# Patient Record
Sex: Female | Born: 1935
Health system: Southern US, Community
[De-identification: ages and names within clinical notes are randomized; demographics above are authoritative.]

## PROBLEM LIST (undated history)

## (undated) DIAGNOSIS — R7309 Other abnormal glucose: Secondary | ICD-10-CM

## (undated) DIAGNOSIS — E119 Type 2 diabetes mellitus without complications: Secondary | ICD-10-CM

## (undated) DIAGNOSIS — S0101XA Laceration without foreign body of scalp, initial encounter: Secondary | ICD-10-CM

## (undated) DIAGNOSIS — I10 Essential (primary) hypertension: Secondary | ICD-10-CM

## (undated) DIAGNOSIS — M1990 Unspecified osteoarthritis, unspecified site: Secondary | ICD-10-CM

## (undated) DIAGNOSIS — I729 Aneurysm of unspecified site: Secondary | ICD-10-CM

## (undated) DIAGNOSIS — M545 Low back pain, unspecified: Secondary | ICD-10-CM

## (undated) DIAGNOSIS — M25569 Pain in unspecified knee: Secondary | ICD-10-CM

## (undated) DIAGNOSIS — I82409 Acute embolism and thrombosis of unspecified deep veins of unspecified lower extremity: Secondary | ICD-10-CM

## (undated) DIAGNOSIS — G629 Polyneuropathy, unspecified: Secondary | ICD-10-CM

## (undated) DIAGNOSIS — G4700 Insomnia, unspecified: Secondary | ICD-10-CM

## (undated) HISTORY — PX: CHOLECYSTECTOMY: SHX55

## (undated) HISTORY — DX: Polyneuropathy, unspecified: G62.9

## (undated) HISTORY — DX: Aneurysm of unspecified site: I72.9

## (undated) HISTORY — DX: Laceration without foreign body of scalp, initial encounter: S01.01XA

## (undated) HISTORY — DX: Pain in unspecified knee: M25.569

## (undated) HISTORY — DX: Other abnormal glucose: R73.09

## (undated) HISTORY — DX: Insomnia, unspecified: G47.00

## (undated) HISTORY — DX: Acute embolism and thrombosis of unspecified deep veins of unspecified lower extremity: I82.409

## (undated) HISTORY — DX: Type 2 diabetes mellitus without complications: E11.9

## (undated) HISTORY — DX: Unspecified osteoarthritis, unspecified site: M19.90

## (undated) HISTORY — PX: TUBAL LIGATION: SHX77

## (undated) HISTORY — PX: JOINT REPLACEMENT: SHX530

## (undated) HISTORY — DX: Low back pain: M54.5

## (undated) HISTORY — PX: CEREBRAL ANEURYSM REPAIR: SHX164

## (undated) HISTORY — DX: Low back pain, unspecified: M54.50

## (undated) HISTORY — PX: TOTAL KNEE ARTHROPLASTY: SHX125

## (undated) HISTORY — DX: Essential (primary) hypertension: I10

---

## 1981-02-12 DIAGNOSIS — I729 Aneurysm of unspecified site: Secondary | ICD-10-CM

## 1981-02-12 HISTORY — DX: Aneurysm of unspecified site: I72.9

## 1998-01-11 ENCOUNTER — Other Ambulatory Visit: Admission: RE | Admit: 1998-01-11 | Discharge: 1998-01-11 | Payer: Self-pay | Admitting: Gynecology

## 2000-05-11 ENCOUNTER — Encounter: Admission: RE | Admit: 2000-05-11 | Discharge: 2000-05-11 | Payer: Self-pay | Admitting: Orthopaedic Surgery

## 2000-05-11 ENCOUNTER — Encounter: Payer: Self-pay | Admitting: Orthopaedic Surgery

## 2000-05-16 ENCOUNTER — Emergency Department (HOSPITAL_COMMUNITY): Admission: EM | Admit: 2000-05-16 | Discharge: 2000-05-16 | Payer: Self-pay | Admitting: Emergency Medicine

## 2004-10-10 ENCOUNTER — Ambulatory Visit: Payer: Self-pay | Admitting: Internal Medicine

## 2005-01-10 ENCOUNTER — Ambulatory Visit: Payer: Self-pay | Admitting: Internal Medicine

## 2005-04-12 ENCOUNTER — Ambulatory Visit: Payer: Self-pay | Admitting: Internal Medicine

## 2005-07-30 ENCOUNTER — Encounter: Admission: RE | Admit: 2005-07-30 | Discharge: 2005-07-30 | Payer: Self-pay

## 2005-10-16 ENCOUNTER — Ambulatory Visit: Payer: Self-pay | Admitting: Internal Medicine

## 2005-10-24 ENCOUNTER — Ambulatory Visit: Payer: Self-pay | Admitting: Internal Medicine

## 2005-11-16 ENCOUNTER — Ambulatory Visit: Payer: Self-pay | Admitting: Internal Medicine

## 2005-11-20 ENCOUNTER — Encounter: Admission: RE | Admit: 2005-11-20 | Discharge: 2006-02-18 | Payer: Self-pay | Admitting: Internal Medicine

## 2006-01-18 ENCOUNTER — Emergency Department (HOSPITAL_COMMUNITY): Admission: EM | Admit: 2006-01-18 | Discharge: 2006-01-18 | Payer: Self-pay | Admitting: Emergency Medicine

## 2006-01-18 ENCOUNTER — Emergency Department (HOSPITAL_COMMUNITY): Admission: EM | Admit: 2006-01-18 | Discharge: 2006-01-19 | Payer: Self-pay | Admitting: Emergency Medicine

## 2006-01-23 ENCOUNTER — Ambulatory Visit: Payer: Self-pay | Admitting: Internal Medicine

## 2006-02-06 ENCOUNTER — Ambulatory Visit: Payer: Self-pay | Admitting: Internal Medicine

## 2006-02-06 LAB — CONVERTED CEMR LAB
BUN: 12 mg/dL (ref 6–23)
Chol/HDL Ratio, serum: 3.8
Cholesterol: 160 mg/dL (ref 0–200)
Creatinine, Ser: 0.9 mg/dL (ref 0.4–1.2)
Glucose, Bld: 129 mg/dL — ABNORMAL HIGH (ref 70–99)
HDL: 41.9 mg/dL (ref >39.0)
Hgb A1c MFr Bld: 6.5 % — ABNORMAL HIGH (ref 4.6–6.0)
LDL Cholesterol: 102 mg/dL — ABNORMAL HIGH (ref 0–99)
Potassium: 3.9 meq/L (ref 3.5–5.1)
Sodium: 146 meq/L — ABNORMAL HIGH (ref 135–145)
Triglyceride fasting, serum: 80 mg/dL (ref 0–149)
VLDL: 16 mg/dL (ref 0–40)

## 2006-02-19 ENCOUNTER — Ambulatory Visit: Payer: Self-pay | Admitting: Internal Medicine

## 2006-05-27 ENCOUNTER — Ambulatory Visit: Payer: Self-pay | Admitting: Internal Medicine

## 2006-06-04 ENCOUNTER — Ambulatory Visit: Payer: Self-pay | Admitting: Internal Medicine

## 2006-06-04 LAB — CONVERTED CEMR LAB
ALT: 16 units/L (ref 0–40)
Alkaline Phosphatase: 82 units/L (ref 39–117)
Amylase: 52 units/L (ref 27–131)
BUN: 14 mg/dL (ref 6–23)
Basophils Absolute: 0 10*3/uL (ref 0.0–0.1)
Basophils Relative: 0.6 % (ref 0.0–1.0)
Cholesterol: 180 mg/dL (ref 0–200)
Creatinine, Ser: 0.7 mg/dL (ref 0.4–1.2)
Eosinophils Absolute: 0.2 10*3/uL (ref 0.0–0.6)
Eosinophils Relative: 2.4 % (ref 0.0–5.0)
Glucose, Bld: 107 mg/dL — ABNORMAL HIGH (ref 70–99)
HCT: 33.5 % — ABNORMAL LOW (ref 36.0–46.0)
HDL: 47.3 mg/dL (ref >39.0)
Hemoglobin: 11.4 g/dL — ABNORMAL LOW (ref 12.0–15.0)
Hgb A1c MFr Bld: 6.1 % — ABNORMAL HIGH (ref 4.6–6.0)
LDL Cholesterol: 114 mg/dL — ABNORMAL HIGH (ref 0–99)
Lipase: 17 units/L (ref 11.0–59.0)
Lymphocytes Relative: 24 % (ref 12.0–46.0)
MCHC: 34.1 g/dL (ref 30.0–36.0)
MCV: 88 fL (ref 78.0–100.0)
Monocytes Absolute: 0.6 10*3/uL (ref 0.2–0.7)
Monocytes Relative: 8.8 % (ref 3.0–11.0)
Neutro Abs: 4.5 10*3/uL (ref 1.4–7.7)
Neutrophils Relative %: 64.2 % (ref 43.0–77.0)
Platelets: 191 10*3/uL (ref 150–400)
Potassium: 3.7 meq/L (ref 3.5–5.1)
RBC: 3.8 M/uL — ABNORMAL LOW (ref 3.87–5.11)
RDW: 12.7 % (ref 11.5–14.6)
Sodium: 142 meq/L (ref 135–145)
Total CHOL/HDL Ratio: 3.8
Triglycerides: 92 mg/dL (ref 0–149)
VLDL: 18 mg/dL (ref 0–40)
WBC: 7 10*3/uL (ref 4.5–10.5)

## 2006-06-11 ENCOUNTER — Ambulatory Visit: Payer: Self-pay | Admitting: Internal Medicine

## 2006-08-20 ENCOUNTER — Ambulatory Visit: Payer: Self-pay | Admitting: Internal Medicine

## 2007-01-28 ENCOUNTER — Ambulatory Visit: Payer: Self-pay | Admitting: Internal Medicine

## 2007-01-28 DIAGNOSIS — R710 Precipitous drop in hematocrit: Secondary | ICD-10-CM | POA: Insufficient documentation

## 2007-01-28 DIAGNOSIS — R739 Hyperglycemia, unspecified: Secondary | ICD-10-CM | POA: Insufficient documentation

## 2007-01-28 DIAGNOSIS — M810 Age-related osteoporosis without current pathological fracture: Secondary | ICD-10-CM | POA: Insufficient documentation

## 2007-01-28 LAB — CONVERTED CEMR LAB
ALT: 18 units/L (ref 0–35)
AST: 18 units/L (ref 0–37)
Albumin: 3.4 g/dL — ABNORMAL LOW (ref 3.5–5.2)
Alkaline Phosphatase: 71 units/L (ref 39–117)
BUN: 20 mg/dL (ref 6–23)
Basophils Absolute: 0 10*3/uL (ref 0.0–0.1)
Basophils Relative: 0.5 % (ref 0.0–1.0)
Bilirubin Urine: NEGATIVE
Bilirubin, Direct: 0.2 mg/dL (ref 0.0–0.3)
CO2: 27 meq/L (ref 19–32)
Calcium: 9.5 mg/dL (ref 8.4–10.5)
Chloride: 109 meq/L (ref 96–112)
Cholesterol: 214 mg/dL (ref 0–200)
Creatinine, Ser: 0.8 mg/dL (ref 0.4–1.2)
Crystals: NEGATIVE
Direct LDL: 133.5 mg/dL
Eosinophils Absolute: 0.2 10*3/uL (ref 0.0–0.6)
Eosinophils Relative: 4.7 % (ref 0.0–5.0)
GFR calc Af Amer: 89 mL/min
GFR calc non Af Amer: 74 mL/min
Glucose, Bld: 106 mg/dL — ABNORMAL HIGH (ref 70–99)
HCT: 34.7 % — ABNORMAL LOW (ref 36.0–46.0)
HDL: 61.3 mg/dL (ref >39.0)
Hemoglobin, Urine: NEGATIVE
Hemoglobin: 12 g/dL (ref 12.0–15.0)
Hgb A1c MFr Bld: 5.8 % (ref 4.6–6.0)
Ketones, ur: NEGATIVE mg/dL
Lymphocytes Relative: 32.7 % (ref 12.0–46.0)
MCHC: 34.5 g/dL (ref 30.0–36.0)
MCV: 89.5 fL (ref 78.0–100.0)
Monocytes Absolute: 0.4 10*3/uL (ref 0.2–0.7)
Monocytes Relative: 8 % (ref 3.0–11.0)
Mucus, UA: NEGATIVE
Neutro Abs: 2.5 10*3/uL (ref 1.4–7.7)
Neutrophils Relative %: 54.1 % (ref 43.0–77.0)
Nitrite: NEGATIVE
Platelets: 153 10*3/uL (ref 150–400)
Potassium: 4.5 meq/L (ref 3.5–5.1)
RBC / HPF: NONE SEEN
RBC: 3.87 M/uL (ref 3.87–5.11)
RDW: 12.5 % (ref 11.5–14.6)
Sodium: 142 meq/L (ref 135–145)
Specific Gravity, Urine: 1.02 (ref 1.000–1.03)
TSH: 1.65 microintl units/mL (ref 0.35–5.50)
Total Bilirubin: 0.6 mg/dL (ref 0.3–1.2)
Total CHOL/HDL Ratio: 3.5
Total Protein, Urine: NEGATIVE mg/dL
Total Protein: 6.2 g/dL (ref 6.0–8.3)
Triglycerides: 59 mg/dL (ref 0–149)
Urine Glucose: NEGATIVE mg/dL
Urobilinogen, UA: 0.2 (ref 0.0–1.0)
VLDL: 12 mg/dL (ref 0–40)
WBC: 4.6 10*3/uL (ref 4.5–10.5)
pH: 6 (ref 5.0–8.0)

## 2007-01-29 LAB — CONVERTED CEMR LAB: Vit D, 1,25-Dihydroxy: 29 — ABNORMAL LOW (ref 30–89)

## 2007-02-17 ENCOUNTER — Ambulatory Visit: Payer: Self-pay | Admitting: Internal Medicine

## 2007-02-17 DIAGNOSIS — K219 Gastro-esophageal reflux disease without esophagitis: Secondary | ICD-10-CM | POA: Insufficient documentation

## 2007-02-17 DIAGNOSIS — E559 Vitamin D deficiency, unspecified: Secondary | ICD-10-CM | POA: Insufficient documentation

## 2007-02-17 DIAGNOSIS — M545 Low back pain: Secondary | ICD-10-CM | POA: Insufficient documentation

## 2007-02-17 DIAGNOSIS — M1990 Unspecified osteoarthritis, unspecified site: Secondary | ICD-10-CM | POA: Insufficient documentation

## 2007-02-17 DIAGNOSIS — I10 Essential (primary) hypertension: Secondary | ICD-10-CM | POA: Insufficient documentation

## 2007-02-17 DIAGNOSIS — G4700 Insomnia, unspecified: Secondary | ICD-10-CM | POA: Insufficient documentation

## 2007-03-13 ENCOUNTER — Encounter: Payer: Self-pay | Admitting: Internal Medicine

## 2007-07-30 ENCOUNTER — Ambulatory Visit: Payer: Self-pay | Admitting: Internal Medicine

## 2007-08-02 LAB — CONVERTED CEMR LAB
ALT: 17 units/L (ref 0–35)
AST: 18 units/L (ref 0–37)
Albumin: 3.8 g/dL (ref 3.5–5.2)
Alkaline Phosphatase: 103 units/L (ref 39–117)
BUN: 22 mg/dL (ref 6–23)
Basophils Absolute: 0.1 10*3/uL (ref 0.0–0.1)
Basophils Relative: 0.9 % (ref 0.0–1.0)
Bilirubin, Direct: 0.1 mg/dL (ref 0.0–0.3)
CO2: 29 meq/L (ref 19–32)
Calcium: 9.6 mg/dL (ref 8.4–10.5)
Chloride: 109 meq/L (ref 96–112)
Cholesterol: 194 mg/dL (ref 0–200)
Creatinine, Ser: 0.8 mg/dL (ref 0.4–1.2)
Eosinophils Absolute: 0.2 10*3/uL (ref 0.0–0.7)
Eosinophils Relative: 3.9 % (ref 0.0–5.0)
GFR calc Af Amer: 89 mL/min
GFR calc non Af Amer: 74 mL/min
Glucose, Bld: 131 mg/dL — ABNORMAL HIGH (ref 70–99)
HCT: 36.5 % (ref 36.0–46.0)
HDL: 44 mg/dL (ref >39.0)
Hemoglobin: 12.6 g/dL (ref 12.0–15.0)
Hgb A1c MFr Bld: 6.1 % — ABNORMAL HIGH (ref 4.6–6.0)
LDL Cholesterol: 133 mg/dL — ABNORMAL HIGH (ref 0–99)
Lymphocytes Relative: 28.1 % (ref 12.0–46.0)
MCHC: 34.6 g/dL (ref 30.0–36.0)
MCV: 89.2 fL (ref 78.0–100.0)
Monocytes Absolute: 0.5 10*3/uL (ref 0.1–1.0)
Monocytes Relative: 8.6 % (ref 3.0–12.0)
Neutro Abs: 3.2 10*3/uL (ref 1.4–7.7)
Neutrophils Relative %: 58.5 % (ref 43.0–77.0)
Platelets: 185 10*3/uL (ref 150–400)
Potassium: 4.6 meq/L (ref 3.5–5.1)
RBC: 4.1 M/uL (ref 3.87–5.11)
RDW: 12.1 % (ref 11.5–14.6)
Sodium: 142 meq/L (ref 135–145)
TSH: 1.99 microintl units/mL (ref 0.35–5.50)
Total Bilirubin: 0.6 mg/dL (ref 0.3–1.2)
Total CHOL/HDL Ratio: 4.4
Total Protein: 6.5 g/dL (ref 6.0–8.3)
Triglycerides: 86 mg/dL (ref 0–149)
VLDL: 17 mg/dL (ref 0–40)
WBC: 5.6 10*3/uL (ref 4.5–10.5)

## 2007-08-19 ENCOUNTER — Ambulatory Visit: Payer: Self-pay | Admitting: Internal Medicine

## 2007-08-19 DIAGNOSIS — M79609 Pain in unspecified limb: Secondary | ICD-10-CM | POA: Insufficient documentation

## 2007-08-19 DIAGNOSIS — G629 Polyneuropathy, unspecified: Secondary | ICD-10-CM | POA: Insufficient documentation

## 2007-11-19 ENCOUNTER — Ambulatory Visit: Payer: Self-pay | Admitting: Internal Medicine

## 2007-11-19 LAB — CONVERTED CEMR LAB
BUN: 24 mg/dL — ABNORMAL HIGH (ref 6–23)
CO2: 28 meq/L (ref 19–32)
Calcium: 9.4 mg/dL (ref 8.4–10.5)
Chloride: 110 meq/L (ref 96–112)
Creatinine, Ser: 0.7 mg/dL (ref 0.4–1.2)
GFR calc Af Amer: 104 mL/min
GFR calc non Af Amer: 86 mL/min
Glucose, Bld: 126 mg/dL — ABNORMAL HIGH (ref 70–99)
Potassium: 4.5 meq/L (ref 3.5–5.1)
Sed Rate: 28 mm/hr — ABNORMAL HIGH (ref 0–22)
Sodium: 143 meq/L (ref 135–145)
Vitamin B-12: 564 pg/mL (ref 211–911)

## 2007-11-26 ENCOUNTER — Ambulatory Visit: Payer: Self-pay | Admitting: Internal Medicine

## 2007-11-27 ENCOUNTER — Encounter: Payer: Self-pay | Admitting: Internal Medicine

## 2008-05-11 ENCOUNTER — Ambulatory Visit: Payer: Self-pay | Admitting: Internal Medicine

## 2008-05-11 LAB — CONVERTED CEMR LAB
BUN: 18 mg/dL (ref 6–23)
CO2: 28 meq/L (ref 19–32)
Calcium: 9.4 mg/dL (ref 8.4–10.5)
Chloride: 109 meq/L (ref 96–112)
Creatinine, Ser: 0.7 mg/dL (ref 0.4–1.2)
GFR calc non Af Amer: 85.78 mL/min (ref >60)
Glucose, Bld: 133 mg/dL — ABNORMAL HIGH (ref 70–99)
Hgb A1c MFr Bld: 6.4 % (ref 4.6–6.5)
Potassium: 3.7 meq/L (ref 3.5–5.1)
Sodium: 144 meq/L (ref 135–145)
TSH: 1.14 microintl units/mL (ref 0.35–5.50)

## 2008-05-18 ENCOUNTER — Ambulatory Visit: Payer: Self-pay | Admitting: Internal Medicine

## 2008-05-18 DIAGNOSIS — M25569 Pain in unspecified knee: Secondary | ICD-10-CM | POA: Insufficient documentation

## 2008-06-07 ENCOUNTER — Encounter: Payer: Self-pay | Admitting: Internal Medicine

## 2008-11-09 ENCOUNTER — Ambulatory Visit: Payer: Self-pay | Admitting: Internal Medicine

## 2008-11-09 LAB — CONVERTED CEMR LAB
ALT: 20 units/L (ref 0–35)
AST: 23 units/L (ref 0–37)
Albumin: 3.8 g/dL (ref 3.5–5.2)
Alkaline Phosphatase: 116 units/L (ref 39–117)
BUN: 17 mg/dL (ref 6–23)
Basophils Absolute: 0.1 10*3/uL (ref 0.0–0.1)
Basophils Relative: 1 % (ref 0.0–3.0)
Bilirubin, Direct: 0.1 mg/dL (ref 0.0–0.3)
CO2: 30 meq/L (ref 19–32)
Calcium: 9.5 mg/dL (ref 8.4–10.5)
Chloride: 112 meq/L (ref 96–112)
Creatinine, Ser: 0.7 mg/dL (ref 0.4–1.2)
Eosinophils Absolute: 0.2 10*3/uL (ref 0.0–0.7)
Eosinophils Relative: 3.7 % (ref 0.0–5.0)
GFR calc non Af Amer: 85.67 mL/min (ref >60)
Glucose, Bld: 124 mg/dL — ABNORMAL HIGH (ref 70–99)
HCT: 38 % (ref 36.0–46.0)
Hemoglobin: 12.9 g/dL (ref 12.0–15.0)
Lymphocytes Relative: 24.3 % (ref 12.0–46.0)
Lymphs Abs: 1.5 10*3/uL (ref 0.7–4.0)
MCHC: 33.8 g/dL (ref 30.0–36.0)
MCV: 91.5 fL (ref 78.0–100.0)
Monocytes Absolute: 0.5 10*3/uL (ref 0.1–1.0)
Monocytes Relative: 8 % (ref 3.0–12.0)
Neutro Abs: 4 10*3/uL (ref 1.4–7.7)
Neutrophils Relative %: 63 % (ref 43.0–77.0)
Platelets: 170 10*3/uL (ref 150.0–400.0)
Potassium: 4 meq/L (ref 3.5–5.1)
RBC: 4.16 M/uL (ref 3.87–5.11)
RDW: 12 % (ref 11.5–14.6)
Sed Rate: 28 mm/hr — ABNORMAL HIGH (ref 0–22)
Sodium: 144 meq/L (ref 135–145)
TSH: 1.43 microintl units/mL (ref 0.35–5.50)
Total Bilirubin: 0.4 mg/dL (ref 0.3–1.2)
Total Protein: 6.8 g/dL (ref 6.0–8.3)
Vitamin B-12: 686 pg/mL (ref 211–911)
WBC: 6.3 10*3/uL (ref 4.5–10.5)

## 2008-11-16 ENCOUNTER — Ambulatory Visit: Payer: Self-pay | Admitting: Internal Medicine

## 2009-02-16 ENCOUNTER — Ambulatory Visit: Payer: Self-pay | Admitting: Internal Medicine

## 2009-02-16 LAB — CONVERTED CEMR LAB
Albumin ELP: 56.1 % (ref 55.8–66.1)
Alpha-1-Globulin: 4.6 % (ref 2.9–4.9)
Alpha-2-Globulin: 11.2 % (ref 7.1–11.8)
BUN: 19 mg/dL (ref 6–23)
Beta Globulin: 6.9 % (ref 4.7–7.2)
CO2: 28 meq/L (ref 19–32)
Calcium: 9.6 mg/dL (ref 8.4–10.5)
Chloride: 109 meq/L (ref 96–112)
Creatinine, Ser: 0.7 mg/dL (ref 0.4–1.2)
GFR calc non Af Amer: 85.61 mL/min (ref >60)
Gamma Globulin: 14 % (ref 11.1–18.8)
Glucose, Bld: 114 mg/dL — ABNORMAL HIGH (ref 70–99)
Hgb A1c MFr Bld: 6.2 % (ref 4.6–6.5)
Potassium: 4.2 meq/L (ref 3.5–5.1)
Sed Rate: 29 mm/hr — ABNORMAL HIGH (ref 0–22)
Sodium: 144 meq/L (ref 135–145)
Total Protein, Serum Electrophoresis: 6.8 g/dL (ref 6.0–8.3)

## 2009-03-09 ENCOUNTER — Ambulatory Visit: Payer: Self-pay | Admitting: Internal Medicine

## 2009-03-09 DIAGNOSIS — Z87891 Personal history of nicotine dependence: Secondary | ICD-10-CM | POA: Insufficient documentation

## 2009-04-07 ENCOUNTER — Encounter: Payer: Self-pay | Admitting: Internal Medicine

## 2009-04-12 HISTORY — PX: KNEE ARTHROPLASTY: SHX992

## 2009-04-19 ENCOUNTER — Encounter: Payer: Self-pay | Admitting: Internal Medicine

## 2009-06-07 ENCOUNTER — Telehealth: Payer: Self-pay | Admitting: Internal Medicine

## 2009-07-07 ENCOUNTER — Telehealth: Payer: Self-pay | Admitting: Internal Medicine

## 2009-08-08 ENCOUNTER — Telehealth: Payer: Self-pay | Admitting: Internal Medicine

## 2009-08-31 ENCOUNTER — Ambulatory Visit: Payer: Self-pay | Admitting: Internal Medicine

## 2009-08-31 LAB — CONVERTED CEMR LAB
ALT: 18 units/L (ref 0–35)
AST: 20 units/L (ref 0–37)
Albumin: 3.8 g/dL (ref 3.5–5.2)
Alkaline Phosphatase: 91 units/L (ref 39–117)
BUN: 20 mg/dL (ref 6–23)
Bilirubin, Direct: 0.1 mg/dL (ref 0.0–0.3)
CO2: 28 meq/L (ref 19–32)
Calcium: 9.4 mg/dL (ref 8.4–10.5)
Chloride: 112 meq/L (ref 96–112)
Cholesterol: 186 mg/dL (ref 0–200)
Creatinine, Ser: 0.7 mg/dL (ref 0.4–1.2)
GFR calc non Af Amer: 85.49 mL/min (ref >60)
Glucose, Bld: 124 mg/dL — ABNORMAL HIGH (ref 70–99)
HDL: 45.1 mg/dL (ref >39.00)
Hgb A1c MFr Bld: 6.6 % — ABNORMAL HIGH (ref 4.6–6.5)
LDL Cholesterol: 110 mg/dL — ABNORMAL HIGH (ref 0–99)
Potassium: 4.6 meq/L (ref 3.5–5.1)
Sodium: 142 meq/L (ref 135–145)
TSH: 1.24 microintl units/mL (ref 0.35–5.50)
Total Bilirubin: 0.4 mg/dL (ref 0.3–1.2)
Total CHOL/HDL Ratio: 4
Total Protein: 6.6 g/dL (ref 6.0–8.3)
Triglycerides: 153 mg/dL — ABNORMAL HIGH (ref 0.0–149.0)
VLDL: 30.6 mg/dL (ref 0.0–40.0)

## 2009-09-06 ENCOUNTER — Ambulatory Visit: Payer: Self-pay | Admitting: Internal Medicine

## 2010-01-09 ENCOUNTER — Ambulatory Visit: Payer: Self-pay | Admitting: Internal Medicine

## 2010-01-09 LAB — CONVERTED CEMR LAB: Vit D, 25-Hydroxy: 50 ng/mL (ref 30–89)

## 2010-01-10 LAB — CONVERTED CEMR LAB
ALT: 19 units/L (ref 0–35)
AST: 21 units/L (ref 0–37)
Albumin: 3.7 g/dL (ref 3.5–5.2)
Alkaline Phosphatase: 96 units/L (ref 39–117)
BUN: 21 mg/dL (ref 6–23)
Basophils Absolute: 0 10*3/uL (ref 0.0–0.1)
Basophils Relative: 0.5 % (ref 0.0–3.0)
Bilirubin Urine: NEGATIVE
Bilirubin, Direct: 0.1 mg/dL (ref 0.0–0.3)
CO2: 28 meq/L (ref 19–32)
Calcium: 9.5 mg/dL (ref 8.4–10.5)
Chloride: 107 meq/L (ref 96–112)
Cholesterol: 200 mg/dL (ref 0–200)
Creatinine, Ser: 0.6 mg/dL (ref 0.4–1.2)
Eosinophils Absolute: 0.2 10*3/uL (ref 0.0–0.7)
Eosinophils Relative: 4 % (ref 0.0–5.0)
GFR calc non Af Amer: 94.72 mL/min (ref >60)
Glucose, Bld: 140 mg/dL — ABNORMAL HIGH (ref 70–99)
HCT: 34.6 % — ABNORMAL LOW (ref 36.0–46.0)
HDL: 46.4 mg/dL (ref >39.00)
Hemoglobin: 12 g/dL (ref 12.0–15.0)
Hgb A1c MFr Bld: 6.6 % — ABNORMAL HIGH (ref 4.6–6.5)
Ketones, ur: NEGATIVE mg/dL
LDL Cholesterol: 121 mg/dL — ABNORMAL HIGH (ref 0–99)
Lymphocytes Relative: 27.1 % (ref 12.0–46.0)
Lymphs Abs: 1.5 10*3/uL (ref 0.7–4.0)
MCHC: 34.6 g/dL (ref 30.0–36.0)
MCV: 90.2 fL (ref 78.0–100.0)
Monocytes Absolute: 0.4 10*3/uL (ref 0.1–1.0)
Monocytes Relative: 8 % (ref 3.0–12.0)
Neutro Abs: 3.4 10*3/uL (ref 1.4–7.7)
Neutrophils Relative %: 60.4 % (ref 43.0–77.0)
Nitrite: NEGATIVE
Platelets: 182 10*3/uL (ref 150.0–400.0)
Potassium: 3.9 meq/L (ref 3.5–5.1)
RBC: 3.84 M/uL — ABNORMAL LOW (ref 3.87–5.11)
RDW: 12.3 % (ref 11.5–14.6)
Sodium: 142 meq/L (ref 135–145)
Specific Gravity, Urine: 1.02 (ref 1.000–1.030)
TSH: 1.89 microintl units/mL (ref 0.35–5.50)
Total Bilirubin: 0.7 mg/dL (ref 0.3–1.2)
Total CHOL/HDL Ratio: 4
Total Protein, Urine: NEGATIVE mg/dL
Total Protein: 6.7 g/dL (ref 6.0–8.3)
Triglycerides: 164 mg/dL — ABNORMAL HIGH (ref 0.0–149.0)
Urine Glucose: NEGATIVE mg/dL
Urobilinogen, UA: 0.2 (ref 0.0–1.0)
VLDL: 32.8 mg/dL (ref 0.0–40.0)
Vitamin B-12: 331 pg/mL (ref 211–911)
WBC: 5.6 10*3/uL (ref 4.5–10.5)
pH: 6 (ref 5.0–8.0)

## 2010-01-17 ENCOUNTER — Ambulatory Visit: Payer: Self-pay | Admitting: Internal Medicine

## 2010-03-14 NOTE — Letter (Signed)
Summary: [facility]  [facility]   Imported By: Sherian Rein 05/04/2009 07:39:50  _____________________________________________________________________  External Attachment:    Type:   Image     Comment:   External Document

## 2010-03-14 NOTE — Assessment & Plan Note (Signed)
Summary: f/u appt, pt does not want cpx/#/cd   Vital Signs:  Patient profile:   77 year old female Height:      62 inches Weight:      178.75 pounds BMI:     32.81 O2 Sat:      96 % on Room air Pulse rate:   71 / minute Pulse rhythm:   regular BP sitting:   136 / 82  (left arm) Cuff size:   large  Vitals Entered By: Rock Nephew CMA (January 17, 2010 10:02 AM)  O2 Flow:  Room air CC: follow-up visit/lap results Is Patient Diabetic? Yes Did you bring your meter with you today? No Pain Assessment Patient in pain? no       Does patient need assistance? Functional Status Self care Ambulation Normal   CC:  follow-up visit/lap results.  History of Present Illness: The patient presents for a follow up of hypertension, diabetes, hyperlipidemia She wants to get off thioridazine - too $$  Current Medications (verified): 1)  Sulindac 150 Mg  Tabs (Sulindac) .Marland Kitchen.. 1 By Mouth Two Times A Day Pc 2)  Thioridazine Hcl 10 Mg  Tabs (Thioridazine Hcl) .Marland Kitchen.. 1 By Mouth Q Hs 3)  Amitriptyline Hcl 25 Mg Tabs (Amitriptyline Hcl) .... Take Two Tablet At Bedtime 4)  Vitamin D3 1000 Unit  Tabs (Cholecalciferol) .... 2 Once Daily 5)  B-100 Complex   Tabs (Vitamins-Lipotropics) .Marland Kitchen.. 1 By Mouth Qd 6)  Hydrocodone-Acetaminophen 5-325 Mg Tabs (Hydrocodone-Acetaminophen) .Marland Kitchen.. 1-2 By Mouth Two Times A Day As Needed Pain 7)  Westcort 0.2 % Oint (Hydrocortisone Valerate) .... Use Two Times A Day Prn 8)  Hydrochlorothiazide 12.5 Mg Caps (Hydrochlorothiazide) .Marland Kitchen.. 1 By Mouth Once Daily For Blood Pressure  Allergies (verified): 1)  ! Naproxen Comfort Pac (Naproxen-Liniment) 2)  ! Lyrica (Pregabalin) 3)  Lyrica 4)  Gabapentin (Gabapentin)  Past History:  Past Medical History: Last updated: 05/18/2008 Peripheral neuropathy unknown etiol GERD Hypertension Elev. glu Insomnia Diabetes mellitus, type II, diet controlled 2009 L knee pain Dr Tomasa Blase Low back pain  Social History: Last  updated: 03/09/2009 Retired Former Smoker Alcohol use-no 1 son Married  Physical Exam  General:  overweight-appearing.   Nose:  External nasal examination shows no deformity or inflammation. Nasal mucosa are pink and moist without lesions or exudates. Mouth:  Oral mucosa and oropharynx without lesions or exudates.  Teeth in good repair. Dry skin on lips Lungs:  Normal respiratory effort, chest expands symmetrically. Lungs are clear to auscultation, no crackles or wheezes. Heart:  Normal rate and regular rhythm. S1 and S2 normal without gallop, murmur, click, rub or other extra sounds. Abdomen:  Bowel sounds positive,abdomen soft and non-tender without masses, organomegaly or hernias noted. Msk:  6 mm bony bump on mid -uper forehead L knee is less  tender w/ROM, scar is healing Neurologic:  No cranial nerve deficits noted. Station and gait are normal. Plantar reflexes are down-going bilaterally. DTRs are symmetrical throughout. Sensory, motor and coordinative functions appear intact. Skin:  Intact without suspicious lesions or rashes Psych:  Cognition and judgment appear intact. Alert and cooperative with normal attention span and concentration. No apparent delusions, illusions, hallucinations   Impression & Recommendations:  Problem # 1:  KNEE PAIN (ICD-719.46) Assessment Unchanged  Her updated medication list for this problem includes:    Sulindac 150 Mg Tabs (Sulindac) .Marland Kitchen... 1 by mouth two times a day pc    Hydrocodone-acetaminophen 5-325 Mg Tabs (Hydrocodone-acetaminophen) .Marland Kitchen... 1-2 by mouth two  times a day as needed pain  Problem # 2:  DIABETES MELLITUS, TYPE II (ICD-250.00) Assessment: Unchanged  Problem # 3:  HYPERTENSION (ICD-401.9) Assessment: Unchanged  Her updated medication list for this problem includes:    Hydrochlorothiazide 12.5 Mg Caps (Hydrochlorothiazide) .Marland Kitchen... 1 by mouth once daily for blood pressure  BP today: 136/82 Prior BP: 136/80 (09/06/2009)  Labs  Reviewed: K+: 3.9 (01/09/2010) Creat: : 0.6 (01/09/2010)   Chol: 200 (01/09/2010)   HDL: 46.40 (01/09/2010)   LDL: 121 (01/09/2010)   TG: 164.0 (01/09/2010)  Problem # 4:  INSOMNIA, PERSISTENT (ICD-307.42) Assessment: Comment Only  Complete Medication List: 1)  Sulindac 150 Mg Tabs (Sulindac) .Marland Kitchen.. 1 by mouth two times a day pc 2)  Amitriptyline Hcl 25 Mg Tabs (Amitriptyline hcl) .... Take two tablet at bedtime 3)  Vitamin D3 1000 Unit Tabs (Cholecalciferol) .... 2 once daily 4)  B-100 Complex Tabs (Vitamins-lipotropics) .Marland Kitchen.. 1 by mouth qd 5)  Hydrocodone-acetaminophen 5-325 Mg Tabs (Hydrocodone-acetaminophen) .Marland Kitchen.. 1-2 by mouth two times a day as needed pain 6)  Westcort 0.2 % Oint (Hydrocortisone valerate) .... Use two times a day prn 7)  Hydrochlorothiazide 12.5 Mg Caps (Hydrochlorothiazide) .Marland Kitchen.. 1 by mouth once daily for blood pressure  Patient Instructions: 1)  Please schedule a follow-up appointment in 6 months. 2)  Take Thioridazine 1/2 tab daily x 1 wk, then stop 3)  BMP prior to visit, ICD-9: 4)  HbgA1C prior to visit, ICD-9:250.00   Orders Added: 1)  Est. Patient Level IV [29562]    Contraindications/Deferment of Procedures/Staging:    Test/Procedure: FLU VAX    Reason for deferment: patient declined

## 2010-03-14 NOTE — Progress Notes (Signed)
Summary: sulindac  Phone Note Refill Request Message from:  Fax from Pharmacy on June 07, 2009 11:34 AM  Refills Requested: Medication #1:  SULINDAC 150 MG  TABS 1 by mouth two times a day pc   Dosage confirmed as above?Dosage Confirmed Next Appointment Scheduled: 09/06/2009 Initial call taken by: Brenton Grills,  June 07, 2009 11:35 AM    Prescriptions: SULINDAC 150 MG  TABS (SULINDAC) 1 by mouth two times a day pc  #60 x 3   Entered by:   Lucious Groves   Authorized by:   Tresa Garter MD   Signed by:   Lucious Groves on 06/07/2009   Method used:   Electronically to        [facility]* (retail)       [address]       [place], [place]  [id number]       Ph: [phone]       Fax: [phone]   RxID:   [id number]

## 2010-03-14 NOTE — Assessment & Plan Note (Signed)
Summary: 6 MTH FU-STC   Vital Signs:  Patient Profile:   77 Years Old Female Weight:      170 pounds Temp:     98.2 degrees F oral Pulse rate:   75 / minute BP sitting:   196 / 109  (left arm)  Vitals Entered By: Tora Perches (August 19, 2007 10:49 AM)                 Chief Complaint:  Multiple medical problems or concerns.  History of Present Illness: C/o B leg pain and tingling from knees to feet and R hand has the same symptoms x months. F/u HTN, insomnia. Forgot the name of her BP med    Current Allergies: LYRICA  Past Medical History:    Peripheral neuropathy    GERD    Hypertension    Elev. glu    Insomnia   Family History:    Family History Hypertension  Social History:    Retired    Former Smoker    Alcohol use-no    1 son   Risk Factors:  Tobacco use:  quit Alcohol use:  no    Physical Exam  General:     Well-developed,well-nourished,in no acute distress; alert,appropriate and cooperative throughout examination Eyes:     No corneal or conjunctival inflammation noted. EOMI. Perrla. Funduscopic exam benign, without hemorrhages, exudates or papilledema. Vision grossly normal. Ears:     External ear exam shows no significant lesions or deformities.  Otoscopic examination reveals clear canals, tympanic membranes are intact bilaterally without bulging, retraction, inflammation or discharge. Hearing is grossly normal bilaterally. Mouth:     Oral mucosa and oropharynx without lesions or exudates.  Teeth in good repair. Neck:     No deformities, masses, or tenderness noted. Lungs:     Normal respiratory effort, chest expands symmetrically. Lungs are clear to auscultation, no crackles or wheezes. Heart:     Normal rate and regular rhythm. S1 and S2 normal without gallop, murmur, click, rub or other extra sounds. Abdomen:     Bowel sounds positive,abdomen soft and non-tender without masses, organomegaly or hernias noted. Msk:      No deformity or scoliosis noted of thoracic or lumbar spine.   Neurologic:     No cranial nerve deficits noted. Station and gait are normal. Plantar reflexes are down-going bilaterally. DTRs are symmetrical throughout. Sensory, motor and coordinative functions appear intact. Skin:     Intact without suspicious lesions or rashes Psych:     Cognition and judgment appear intact. Alert and cooperative with normal attention span and concentration. No apparent delusions, illusions, hallucinations    Impression & Recommendations:  Problem # 1:  PERIPHERAL NEUROPATHY (ICD-356.9) B LE Assessment: Deteriorated Get labs. Unclear etiol. Rx given  Problem # 2:  LEG PAIN (ICD-729.5) B due to above Assessment: Deteriorated  Problem # 3:  OSTEOARTHRITIS (ICD-715.90)  Her updated medication list for this problem includes:    Clinoril 200 Mg Tabs (Sulindac) .Marland Kitchen... 1 by mouth once daily pc    Darvocet-n 100 100-650 Mg Tabs (Propoxyphene n-apap) .Marland Kitchen... 1 by mouth three times a day as needed pain   Problem # 4:  HYPERTENSION (ICD-401.9) Assessment: Comment Only States BP is Ok at home Cont Rx  Complete Medication List: 1)  Clinoril 200 Mg Tabs (Sulindac) .Marland Kitchen.. 1 by mouth once daily pc 2)  Amitriptyline Hcl 50 Mg Tabs (Amitriptyline hcl) .... Take 1 tab by mouth at bedtime 3)  Thioridazine Hcl 10  Mg Tabs (Thioridazine hcl) .Marland Kitchen.. 1 by mouth q hs 4)  Darvocet-n 100 100-650 Mg Tabs (Propoxyphene n-apap) .Marland Kitchen.. 1 by mouth three times a day as needed pain 5)  Vitamin D3 1000 Unit Tabs (Cholecalciferol) .Marland Kitchen.. 1 by mouth daily 6)  B-100 Complex Tabs (Vitamins-lipotropics) .Marland Kitchen.. 1 by mouth qd   Patient Instructions: 1)  Please schedule a follow-up appointment in 3 months. 2)  BMP prior to visit, ICD-9: 3)  ESR 4)  Vit B12 782.0   Prescriptions: DARVOCET-N 100 100-650 MG TABS (PROPOXYPHENE N-APAP) 1 by mouth three times a day as needed pain  #90 x 3    Entered and Authorized by:   Tresa Garter MD   Signed by:   Tresa Garter MD on 08/19/2007   Method used:   Print then Give to Patient   RxID:   [id number]  ]

## 2010-03-14 NOTE — Progress Notes (Signed)
Summary: REFILLS  Phone Note Refill Request Message from:  Fax from Pharmacy on Jul 07, 2009 4:25 PM  Refills Requested: Medication #1:  HYDROCODONE-ACETAMINOPHEN 5-325 MG TABS 1-2 by mouth two times a day as needed pain   Last Refilled: 03/09/2009   Is this ok to refill ([place])  Initial call taken by: Rock Nephew CMA,  Jul 07, 2009 4:25 PM  Follow-up for Phone Call        ok to ref keep ROV Follow-up by: Tresa Garter MD,  Jul 08, 2009 7:47 AM    Prescriptions: HYDROCODONE-ACETAMINOPHEN 5-325 MG TABS (HYDROCODONE-ACETAMINOPHEN) 1-2 by mouth two times a day as needed pain  #100 x 1   Entered by:   Lamar Sprinkles, CMA   Authorized by:   Tresa Garter MD   Signed by:   Lamar Sprinkles, CMA on 07/08/2009   Method used:   Telephoned to ...       [facility]* (retail)       [address]       [place], [place]  [address]       Ph: [phone]       Fax: [phone]   RxID:   [id number] THIORIDAZINE HCL 10 MG  TABS (THIORIDAZINE HCL) 1 by mouth q hs  #30 Each x 6   Entered by:   Rock Nephew CMA   Authorized by:   Tresa Garter MD   Signed by:   Rock Nephew CMA on 07/07/2009   Method used:   Electronically to        [facility]* (retail)       [address]       [place], [place]  [address]       Ph: [phone]       Fax: [phone]   RxID:   [id number]

## 2010-03-14 NOTE — Progress Notes (Signed)
Summary: Refill--Amitriptyline  Phone Note Refill Request Message from:  Fax from Pharmacy on August 08, 2009 2:33 PM  Refills Requested: Medication #1:  AMITRIPTYLINE HCL 25 MG TABS Take two tablet at bedtime   Supply Requested: 9 months Next Appointment Scheduled: 09-06-09 Initial call taken by: Lucious Groves,  August 08, 2009 2:33 PM  Follow-up for Phone Call        ok Follow-up by: Etta Grandchild MD,  August 08, 2009 2:34 PM

## 2010-03-14 NOTE — Assessment & Plan Note (Signed)
Summary: 6 mo rov Kristina Wilkinson $50 Kristina Wilkinson   Vital Signs:  Patient profile:   77 year old female Weight:      174 pounds Temp:     98.2 degrees F oral Pulse rate:   88 / minute BP sitting:   136 / 92  (left arm)  Vitals Entered By: Tora Perches (November 16, 2008 10:11 AM) CC: f/u Is Patient Diabetic? No   CC:  f/u.  History of Present Illness: The patient presents for a follow up of hypertension, hyperlipidemia, OA, neuropathy   Current Medications (verified): 1)  Sulindac 150 Mg  Tabs (Sulindac) .Marland Kitchen.. 1 By Mouth Two Times A Day Pc 2)  Thioridazine Hcl 10 Mg  Tabs (Thioridazine Hcl) .Marland Kitchen.. 1 By Mouth Q Hs 3)  Amitriptyline Hcl 25 Mg Tabs (Amitriptyline Hcl) .... Take Two Tablet At Bedtime 4)  Vitamin D3 1000 Unit  Tabs (Cholecalciferol) .... 2 Once Daily 5)  B-100 Complex   Tabs (Vitamins-Lipotropics) .Marland Kitchen.. 1 By Mouth Qd 6)  Triamcinolone Acetonide 0.5 % Crea (Triamcinolone Acetonide) .... Apply Bid To Affected Area  Allergies: 1)  ! Naproxen Comfort Pac (Naproxen-Liniment) 2)  ! Lyrica (Pregabalin) 3)  Lyrica 4)  Gabapentin (Gabapentin)  Past History:  Past Medical History: Last updated: 05/18/2008 Peripheral neuropathy unknown etiol GERD Hypertension Elev. glu Insomnia Diabetes mellitus, type II, diet controlled 2009 L knee pain Dr Tomasa Blase Low back pain  Social History: Last updated: 08/19/2007 Retired Former Smoker Alcohol use-no 1 son  Review of Systems  The patient denies fever, dyspnea on exertion, and prolonged cough.    Physical Exam  General:  overweight-appearing.   Nose:  External nasal examination shows no deformity or inflammation. Nasal mucosa are pink and moist without lesions or exudates. Mouth:  Oral mucosa and oropharynx without lesions or exudates.  Teeth in good repair. Lungs:  Normal respiratory effort, chest expands symmetrically. Lungs are clear to auscultation, no crackles or wheezes.  Heart:  Normal rate and regular rhythm. S1 and S2 normal without gallop, murmur, click, rub or other extra sounds. Abdomen:  Bowel sounds positive,abdomen soft and non-tender without masses, organomegaly or hernias noted. Msk:  6 mm bony bump on mid -uper forehead Neurologic:  No cranial nerve deficits noted. Station and gait are normal. Plantar reflexes are down-going bilaterally. DTRs are symmetrical throughout. Sensory, motor and coordinative functions appear intact. Skin:  Intact without suspicious lesions or rashes Psych:  Cognition and judgment appear intact. Alert and cooperative with normal attention span and concentration. No apparent delusions, illusions, hallucinations   Impression & Recommendations:  Problem # 1:  OSTEOARTHRITIS (ICD-715.90) Assessment Unchanged  Her updated medication list for this problem includes:    Sulindac 150 Mg Tabs (Sulindac) .Marland Kitchen... 1 by mouth two times a day pc  Problem # 2:  PERIPHERAL NEUROPATHY (ICD-356.9) Assessment: Unchanged On prescription therapy   Problem # 3:  HYPERTENSION (ICD-401.9) Assessment: Improved  BP today: 136/92 Prior BP: 142/100 (05/18/2008)  Labs Reviewed: K+: 4.0 (11/09/2008) Creat: : 0.7 (11/09/2008)   Chol: 194 (07/30/2007)   HDL: 44.0 (07/30/2007)   LDL: 133 (07/30/2007)   TG: 86 (07/30/2007)  Problem # 4:  DIABETES MELLITUS, TYPE II (ICD-250.00) Assessment: Comment Only On diet  Problem # 5:  LOW BACK PAIN (ICD-724.2) Assessment: Comment Only  Her updated medication list for this problem includes:    Sulindac 150 Mg Tabs (Sulindac) .Marland Kitchen... 1 by mouth two times a day pc  Problem # 6:  6 mm bony growth on  forehead Assessment: New Refused x rays  Complete Medication List: 1)  Sulindac 150 Mg Tabs (Sulindac) .Marland Kitchen.. 1 by mouth two times a day pc 2)  Thioridazine Hcl 10 Mg Tabs (Thioridazine hcl) .Marland Kitchen.. 1 by mouth q hs 3)  Amitriptyline Hcl 25 Mg Tabs (Amitriptyline hcl) .... Take two tablet at bedtime  4)  Vitamin D3 1000 Unit Tabs (Cholecalciferol) .... 2 once daily 5)  B-100 Complex Tabs (Vitamins-lipotropics) .Marland Kitchen.. 1 by mouth qd 6)  Triamcinolone Acetonide 0.5 % Crea (Triamcinolone acetonide) .... Apply bid to affected area 7)  Cymbalta 30 Mg Cpep (Duloxetine hcl) .Marland Kitchen.. 1 by mouth qd  Other Orders: Pneumococcal Vaccine (55732) Admin 1st Vaccine (20254) Admin 1st Vaccine [facility]) [phone])  Patient Instructions: 1)  Try to eat more raw plant food, fresh and dry fruit, raw almonds, leafy vegetables, whole foods and less red meat, less animal fat. Poultry and fish is better for you than pork and beef. Avoid processed foods (canned soups, hot dogs, sausage, bacon , frozen dinners). Avoid corn syrup, high fructose syrup or aspartam and Splenda  containing drinks. Honey, Agave and Stevia are better sweeteners. Make your own  dressing with olive oil, wine vinegar, lemon juce, garlic etc. for your salads. 2)  Try a gluten free diet x 1 month 3)  Diabetic socks 4)  Please schedule a follow-up appointment in 3 months. 5)  BMP prior to visit, ICD-9: 6)  HbgA1C prior to visit, ICD-9: 7)  ESR 8)  SPEP 782.0   250.00 Prescriptions: CYMBALTA 30 MG CPEP (DULOXETINE HCL) 1 by mouth qd  #30 x 6   Entered and Authorized by:   Tresa Garter MD   Signed by:   Tresa Garter MD on 11/16/2008   Method used:   Print then Give to Patient   RxID:   [id number]    Pneumovax Vaccine    Vaccine Type: Pneumovax    Site: left deltoid    Mfr: Merck    Dose: 0.5 ml    Route: IM    Given by: Tora Perches    Exp. Date: 01/23/2010    Lot #: [id number]    VIS given: 09/10/95 version given November 16, 2008.

## 2010-03-14 NOTE — Assessment & Plan Note (Signed)
Summary: 6 MTH FU-STC   Vital Signs:  Patient Profile:   77 Years Old Female Weight:      164 pounds Temp:     97.2 degrees F oral Pulse rate:   63 / minute BP sitting:   192 / 93  (left arm)  Vitals Entered By: Tora Perches (February 17, 2007 10:14 AM)             Is Patient Diabetic? No     Chief Complaint:  Multiple medical problems or concerns.  History of Present Illness: The patient presents for a follow up of hypertension, OA, hyperlipidemia   Acute Visit History: The patient has a prior history of GERD.        Current Allergies: No known allergies   Past Medical History:    GERD    Osteoarthritis    Low back pain    Insomnia    Hypertension - [place] HTN with Nl readings at home    Family History:    Family History Hypertension none  Social History:    Occupation:    Single        Former Smoker   Risk Factors:  Tobacco use:  quit   Review of Systems  The patient denies chest pain and syncope.         BP nl at home   Physical Exam  General:     Well-developed,well-nourished,in no acute distress; alert,appropriate and cooperative throughout examination Ears:     External ear exam shows no significant lesions or deformities.  Otoscopic examination reveals clear canals, tympanic membranes are intact bilaterally without bulging, retraction, inflammation or discharge. Hearing is grossly normal bilaterally. Nose:     External nasal examination shows no deformity or inflammation. Nasal mucosa are pink and moist without lesions or exudates. Mouth:     Oral mucosa and oropharynx without lesions or exudates.  Teeth in good repair. Neck:     No deformities, masses, or tenderness noted. Lungs:     Normal respiratory effort, chest expands symmetrically. Lungs are clear to auscultation, no crackles or wheezes. Heart:     Normal rate and regular rhythm. S1 and S2 normal without gallop, murmur, click, rub or other extra sounds. Abdomen:      Bowel sounds positive,abdomen soft and non-tender without masses, organomegaly or hernias noted. Msk:     Lumbar-sacral spine is tender to palpation over paraspinal muscles and painfull with the ROM  Neurologic:     No cranial nerve deficits noted. Station and gait are normal. Plantar reflexes are down-going bilaterally. DTRs are symmetrical throughout. Sensory, motor and coordinative functions appear intact. Skin:     Intact without suspicious lesions or rashes Psych:     Cognition and judgment appear intact. Alert and cooperative with normal attention span and concentration. No apparent delusions, illusions, hallucinations    Impression & Recommendations:  Problem # 1:  OSTEOARTHRITIS (ICD-715.90) Assessment: Unchanged  Her updated medication list for this problem includes:    Sulindac 150 Mg Tabs (Sulindac) .Marland Kitchen... 1 by mouth two times a day pc The labs were reviewd with the patient.   Problem # 2:  ESSENTIAL HYPERTENSION, BENIGN (ICD-401.1) Assessment: Unchanged Nl BP at home. No RX   Problem # 3:  LOW BACK PAIN (ICD-724.2)  Her updated medication list for this problem includes:    Sulindac 150 Mg Tabs (Sulindac) .Marland Kitchen... 1 by mouth two times a day pc   Problem # 4:  DIABETES MELLITUS, CONTROLLED, WITHOUT  COMPLICATIONS (ICD-250.00) On diet  Problem # 5:  VITAMIN D DEFICIENCY (ICD-268.9) Increase to 2000 international units Vit D per day  Problem # 6:  Preventive Health Care (ICD-V70.0) Refusing most of the screening tests, vaccines. Risks discussed.  Complete Medication List: 1)  Sulindac 150 Mg Tabs (Sulindac) .Marland Kitchen.. 1 by mouth two times a day pc 2)  Ranitidine Hcl 300 Mg Tabs (Ranitidine hcl) .Marland Kitchen.. 1 po once daily 3)  Thioridazine Hcl 10 Mg Tabs (Thioridazine hcl) .Marland Kitchen.. 1 by mouth q hs 4)  Amitriptyline Hcl 25 Mg Tabs (Amitriptyline hcl) .... Take one tablet at bedtime 5)  Vitamin D3 1000 Unit Tabs (Cholecalciferol) .... 2 once daily   Patient Instructions:  1)  Please schedule a follow-up appointment in 6 months. 2)  Will order CBC, TSH, BMET, Hepatic panel, UA, Lipids,HgbA1C 3)  Dx: V70.0, 401.1, 272.0, 790.29    ]

## 2010-03-14 NOTE — Assessment & Plan Note (Signed)
Summary: 6 MO ROV /NWS  #   Vital Signs:  Patient profile:   77 year old female Height:      62 inches Weight:      172 pounds BMI:     31.57 O2 Sat:      95 % on Room air Temp:     98.3 degrees F oral Pulse rate:   99 / minute Pulse rhythm:   regular Resp:     16 per minute BP sitting:   136 / 80  (left arm) Cuff size:   regular  Vitals Entered By: Lanier Prude, CMA(AAMA) (September 06, 2009 11:01 AM)  O2 Flow:  Room air CC: 6 mo f/u Is Patient Diabetic? Yes Comments pt is not taking Vit D or vit B complex.  Please remove from list   CC:  6 mo f/u.  History of Present Illness: The patient presents for a follow up of LBP  hypertension, diabetes, hyperlipidemia, OA   Current Medications (verified): 1)  Sulindac 150 Mg  Tabs (Sulindac) .Marland Kitchen.. 1 By Mouth Two Times A Day Pc 2)  Thioridazine Hcl 10 Mg  Tabs (Thioridazine Hcl) .Marland Kitchen.. 1 By Mouth Q Hs 3)  Amitriptyline Hcl 25 Mg Tabs (Amitriptyline Hcl) .... Take Two Tablet At Bedtime 4)  Vitamin D3 1000 Unit  Tabs (Cholecalciferol) .... 2 Once Daily 5)  B-100 Complex   Tabs (Vitamins-Lipotropics) .Marland Kitchen.. 1 By Mouth Qd 6)  Hydrocodone-Acetaminophen 5-325 Mg Tabs (Hydrocodone-Acetaminophen) .Marland Kitchen.. 1-2 By Mouth Two Times A Day As Needed Pain 7)  Westcort 0.2 % Oint (Hydrocortisone Valerate) .... Use Two Times A Day Prn 8)  Hydrochlorothiazide 12.5 Mg Caps (Hydrochlorothiazide) .Marland Kitchen.. 1 By Mouth Once Daily For Blood Pressure  Allergies (verified): 1)  ! Naproxen Comfort Pac (Naproxen-Liniment) 2)  ! Lyrica (Pregabalin) 3)  Lyrica 4)  Gabapentin (Gabapentin)  Past History:  Past Medical History: Last updated: 05/18/2008 Peripheral neuropathy unknown etiol GERD Hypertension Elev. 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    Physical Exam  General:  overweight-appearing.   Nose:  External nasal examination shows no deformity or inflammation. Nasal mucosa are pink and moist without lesions or exudates. Mouth:  Oral mucosa and oropharynx without lesions or exudates.  Teeth in good repair. Dry skin on lips Lungs:  Normal respiratory effort, chest expands symmetrically. Lungs are clear to auscultation, no crackles or wheezes. Heart:  Normal rate and regular rhythm. S1 and S2 normal without gallop, murmur, click, rub or other extra sounds. Abdomen:  Bowel sounds positive,abdomen soft and non-tender without masses, organomegaly or hernias noted. Msk:  6 mm bony bump on mid -uper forehead L knee is less  tender w/ROM, scar is healing Neurologic:  No cranial nerve deficits noted. Station and gait are normal. Plantar reflexes are down-going bilaterally. DTRs are symmetrical throughout. Sensory, motor and coordinative functions appear intact. Skin:  Intact without suspicious lesions or rashes Psych:  Cognition and judgment appear intact. Alert and cooperative with normal attention span and concentration. No apparent delusions, illusions, hallucinations   Impression & Recommendations:  Problem # 1:  KNEE PAIN (ICD-719.46) L Assessment Improved  Her updated medication list for this problem includes:  Sulindac 150 Mg Tabs (Sulindac) .Marland Kitchen... 1 by mouth two times a day pc    Hydrocodone-acetaminophen 5-325 Mg Tabs (Hydrocodone-acetaminophen) .Marland Kitchen... 1-2 by mouth two times a day as needed pain  Problem # 2:  HYPERTENSION (ICD-401.9) Assessment: Improved  Her updated medication list for this problem includes:    Hydrochlorothiazide 12.5 Mg Caps (Hydrochlorothiazide) .Marland Kitchen... 1 by mouth once daily for blood pressure  BP today: 136/80 Prior BP: 170/100 (03/09/2009)  Labs  Reviewed: K+: 4.6 (08/31/2009) Creat: : 0.7 (08/31/2009)   Chol: 186 (08/31/2009)   HDL: 45.10 (08/31/2009)   LDL: 110 (08/31/2009)   TG: 153.0 (08/31/2009)  Problem # 3:  OSTEOARTHRITIS (ICD-715.90)  Her updated medication list for this problem includes:    Sulindac 150 Mg Tabs (Sulindac) .Marland Kitchen... 1 by mouth two times a day pc    Hydrocodone-acetaminophen 5-325 Mg Tabs (Hydrocodone-acetaminophen) .Marland Kitchen... 1-2 by mouth two times a day as needed pain  Problem # 4:  DIABETES MELLITUS, TYPE II (ICD-250.00) Assessment: New On diet  Problem # 5:  PERIPHERAL NEUROPATHY (ICD-356.9) Assessment: Unchanged  Problem # 6:  INSOMNIA, PERSISTENT (ICD-307.42)  Complete Medication List: 1)  Sulindac 150 Mg Tabs (Sulindac) .Marland Kitchen.. 1 by mouth two times a day pc 2)  Thioridazine Hcl 10 Mg Tabs (Thioridazine hcl) .Marland Kitchen.. 1 by mouth q hs 3)  Amitriptyline Hcl 25 Mg Tabs (Amitriptyline hcl) .... Take two tablet at bedtime 4)  Vitamin D3 1000 Unit Tabs (Cholecalciferol) .... 2 once daily 5)  B-100 Complex Tabs (Vitamins-lipotropics) .Marland Kitchen.. 1 by mouth qd 6)  Hydrocodone-acetaminophen 5-325 Mg Tabs (Hydrocodone-acetaminophen) .Marland Kitchen.. 1-2 by mouth two times a day as needed pain 7)  Westcort 0.2 % Oint (Hydrocortisone valerate) .... Use two times a day prn 8)  Hydrochlorothiazide 12.5 Mg Caps (Hydrochlorothiazide) .Marland Kitchen.. 1 by mouth once daily for blood pressure  Patient Instructions: 1)  Please schedule a follow-up appointment in 4 months well wlabs v70.0. 2)  HbgA1C prior to visit, ICD-9: 3)  Vit D Vit B12  250.00 782.0 268.9

## 2010-03-14 NOTE — Assessment & Plan Note (Signed)
Summary: 3 MO ROV /NWS $50   Vital Signs:  Patient Profile:   77 Years Old Female Height:     97.5 inches Weight:      175 pounds Temp:     97.5 degrees F oral Pulse rate:   92 / minute BP sitting:   150 / 90  (left arm)  Vitals Entered By: Tora Perches (November 26, 2007 10:16 AM)                 Chief Complaint:  Multiple medical problems or concerns.  History of Present Illness: C/o B LE leg pain L > R. F/u GERD, HTN, neuropathy    Current Allergies (reviewed today): ! NAPROXEN COMFORT PAC (NAPROXEN-LINIMENT) ! LYRICA (PREGABALIN) LYRICA  Past Medical History:    Reviewed history from 08/19/2007 and no changes required:       Peripheral neuropathy unknown etiol       GERD       Hypertension       Elev. glu       Insomnia       Diabetes mellitus, type II, diet controlled 2009   Family History:    Reviewed history from 08/19/2007 and no changes required:       Family History Hypertension  Social History:    Reviewed history from 08/19/2007 and no changes required:       Retired       Former Smoker       Alcohol use-no       1 son    Review of Systems  The patient denies fever, chest pain, hemoptysis, and abdominal pain.         BP nl at home   Physical Exam  General:     overweight-appearing.   Ears:     External ear exam shows no significant lesions or deformities.  Otoscopic examination reveals clear canals, tympanic membranes are intact bilaterally without bulging, retraction, inflammation or discharge. Hearing is grossly normal bilaterally. Nose:     External nasal examination shows no deformity or inflammation. Nasal mucosa are pink and moist without lesions or exudates. Mouth:     Oral mucosa and oropharynx without lesions or exudates.  Teeth in good repair. Neck:     No deformities, masses, or tenderness noted. Lungs:     Normal respiratory effort, chest expands symmetrically. Lungs are clear to auscultation, no crackles or wheezes.  Heart:     Normal rate and regular rhythm. S1 and S2 normal without gallop, murmur, click, rub or other extra sounds. Abdomen:     Bowel sounds positive,abdomen soft and non-tender without masses, organomegaly or hernias noted. Msk:     No deformity or scoliosis noted of thoracic or lumbar spine.   Neurologic:     No cranial nerve deficits noted. Station and gait are normal. Plantar reflexes are down-going bilaterally. DTRs are symmetrical throughout. Sensory, motor and coordinative functions appear intact. Skin:     Intact without suspicious lesions or rashes Psych:     Cognition and judgment appear intact. Alert and cooperative with normal attention span and concentration. No apparent delusions, illusions, hallucinations    Impression & Recommendations:  Problem # 1:  PERIPHERAL NEUROPATHY (ICD-356.9) Assessment: Unchanged The labs were reviewd with the patient. H/o Neurol. eval.   Problem # 2:  LEG PAIN (ICD-729.5) due to #1 Gabapentin to try  Problem # 3:  VITAMIN D DEFICIENCY (ICD-268.9) On prescription therapy   Problem # 4:  OSTEOARTHRITIS (ICD-715.90) Assessment:  Unchanged Seeing Dr Phylliss Bob, had  The following medications were removed from the medication list:    Clinoril 200 Mg Tabs (Sulindac) ..... Once daily    Darvocet-n 100 100-650 Mg Tabs (Propoxyphene n-apap) ..... Qid    Clinoril 200 Mg Tabs (Sulindac) .Marland Kitchen... 1 by mouth once daily pc    Darvocet-n 100 100-650 Mg Tabs (Propoxyphene n-apap) .Marland Kitchen... 1 by mouth three times a day as needed pain  Her updated medication list for this problem includes:    Sulindac 150 Mg Tabs (Sulindac) .Marland Kitchen... 1 by mouth two times a day pc   Problem # 5:  DIABETES MELLITUS, TYPE II (ICD-250.00) Assessment: New Mild. On diet for now. Loose wt  Problem # 6:  HYPERTENSION (ICD-401.9) Assessment: Unchanged Nl at home per pt The following medications were removed from the medication list:     Propranolol Hcl 20 Mg Tabs (Propranolol hcl) .Marland Kitchen... Three times a day as needed   Complete Medication List: 1)  Sulindac 150 Mg Tabs (Sulindac) .Marland Kitchen.. 1 by mouth two times a day pc 2)  Thioridazine Hcl 10 Mg Tabs (Thioridazine hcl) .Marland Kitchen.. 1 by mouth q hs 3)  Amitriptyline Hcl 25 Mg Tabs (Amitriptyline hcl) .... Take two tablet at bedtime 4)  Vitamin D3 1000 Unit Tabs (Cholecalciferol) .... 2 once daily 5)  B-100 Complex Tabs (Vitamins-lipotropics) .Marland Kitchen.. 1 by mouth qd 6)  Neurontin 100 Mg Caps (Gabapentin) .Marland Kitchen.. 1 by mouth three times a day as needed pain 7)  Triamcinolone Acetonide 0.5 % Crea (Triamcinolone acetonide) .... Apply bid to affected area   Patient Instructions: 1)  Please schedule a follow-up appointment in 3 months. 2)  BMP prior to visit, ICD-9: 3)  HbgA1C prior to visit, ICD-9: 4)  TSH prior to visit, ICD-9: 250.00   Prescriptions: TRIAMCINOLONE ACETONIDE 0.5 % CREA (TRIAMCINOLONE ACETONIDE) apply bid to affected area  #60 g x 3   Entered and Authorized by:   Tresa Garter MD   Signed by:   Tresa Garter MD on 11/26/2007   Method used:   Print then Give to Patient   RxID:   [id number] NEURONTIN 100 MG CAPS (GABAPENTIN) 1 by mouth three times a day as needed pain  #90 x 6   Entered and Authorized by:   Tresa Garter MD   Signed by:   Tresa Garter MD on 11/26/2007   Method used:   Print then Give to Patient   RxID:   [id number]  ]

## 2010-03-14 NOTE — Assessment & Plan Note (Signed)
Summary: 3 MO ROV /NWS $50   Vital Signs:  Patient profile:   77 year old female Height:      62 inches Weight:      178 pounds BMI:     32.67 Temp:     97.3 degrees F oral Pulse rate:   90 / minute BP sitting:   142 / 100  (left arm)  Vitals Entered By: Tora Perches (May 18, 2008 10:41 AM) CC: f/u Is Patient Diabetic? Yes   CC:  f/u.  Current Medications (verified): 1)  Sulindac 150 Mg  Tabs (Sulindac) .Marland Kitchen.. 1 By Mouth Two Times A Day Pc 2)  Thioridazine Hcl 10 Mg  Tabs (Thioridazine Hcl) .Marland Kitchen.. 1 By Mouth Q Hs 3)  Amitriptyline Hcl 25 Mg Tabs (Amitriptyline Hcl) .... Take Two Tablet At Bedtime 4)  Vitamin D3 1000 Unit  Tabs (Cholecalciferol) .... 2 Once Daily 5)  B-100 Complex   Tabs (Vitamins-Lipotropics) .Marland Kitchen.. 1 By Mouth Qd 6)  Neurontin 100 Mg Caps (Gabapentin) .Marland Kitchen.. 1 By Mouth Three Times A Day As Needed Pain 7)  Triamcinolone Acetonide 0.5 % Crea (Triamcinolone Acetonide) .... Apply Bid To Affected Area  Allergies: 1)  ! Naproxen Comfort Pac (Naproxen-Liniment) 2)  ! Lyrica (Pregabalin) 3)  Lyrica  Past History:  Past Medical History:    Peripheral neuropathy unknown etiol    GERD    Hypertension    Elev. glu    Insomnia    Diabetes mellitus, type II, diet controlled 2009    L knee pain Dr Tomasa Blase    Low back pain  Family History:    Reviewed history from 08/19/2007 and no changes required:       Family History Hypertension  Social History:    Reviewed history from 08/19/2007 and no changes required:       Retired       Former Smoker       Alcohol use-no       1 son  Physical Exam  General:  overweight-appearing.   Ears:  External ear exam shows no significant lesions or deformities.  Otoscopic examination reveals clear canals, tympanic membranes are intact bilaterally without bulging, retraction, inflammation or discharge. Hearing is grossly normal bilaterally.  Nose:  External nasal examination shows no deformity or inflammation. Nasal mucosa are pink and moist without lesions or exudates. Mouth:  Oral mucosa and oropharynx without lesions or exudates.  Teeth in good repair. Neck:  No deformities, masses, or tenderness noted. Lungs:  Normal respiratory effort, chest expands symmetrically. Lungs are clear to auscultation, no crackles or wheezes. Heart:  Normal rate and regular rhythm. S1 and S2 normal without gallop, murmur, click, rub or other extra sounds. Abdomen:  Bowel sounds positive,abdomen soft and non-tender without masses, organomegaly or hernias noted. Msk:  No deformity or scoliosis noted of thoracic or lumbar spine.   Neurologic:  No cranial nerve deficits noted. Station and gait are normal. Plantar reflexes are down-going bilaterally. DTRs are symmetrical throughout. Sensory, motor and coordinative functions appear intact. Skin:  Intact without suspicious lesions or rashes Psych:  Cognition and judgment appear intact. Alert and cooperative with normal attention span and concentration. No apparent delusions, illusions, hallucinations   Impression & Recommendations:  Problem # 1:  LEG PAIN (ICD-729.5) Assessment Unchanged F/u w/Dr Phylliss Bob is pending this mo  Problem # 2:  KNEE PAIN (ICD-719.46) Wants to go to [facility] to be evaluated Her updated medication list for this problem includes:    Sulindac 150  Mg Tabs (Sulindac) .Marland Kitchen... 1 by mouth two times a day pc  Problem # 3:  OSTEOARTHRITIS (ICD-715.90) Assessment: Comment Only  Her updated medication list for this problem includes:    Sulindac 150 Mg Tabs (Sulindac) .Marland Kitchen... 1 by mouth two times a day pc  Problem # 4:  PERIPHERAL NEUROPATHY (ICD-356.9) Assessment: Unchanged As above  Gaba 300 mg bid  Complete Medication List: 1)  Sulindac 150 Mg Tabs (Sulindac) .Marland Kitchen.. 1 by mouth two times a day pc 2)  Thioridazine Hcl 10 Mg Tabs (Thioridazine hcl) .Marland Kitchen.. 1 by mouth q hs  3)  Amitriptyline Hcl 25 Mg Tabs (Amitriptyline hcl) .... Take two tablet at bedtime 4)  Vitamin D3 1000 Unit Tabs (Cholecalciferol) .... 2 once daily 5)  B-100 Complex Tabs (Vitamins-lipotropics) .Marland Kitchen.. 1 by mouth qd 6)  Triamcinolone Acetonide 0.5 % Crea (Triamcinolone acetonide) .... Apply bid to affected area 7)  Gabapentin 300 Mg Caps (Gabapentin) .Marland Kitchen.. 1 by mouth two times a day prn  Patient Instructions: 1)  Please schedule a follow-up appointment in 3-6 months. 2)  BMP prior to visit, ICD-9: 3)  Hepatic Panel prior to visit, ICD-9: 4)  TSH prior to visit, ICD-9: 5)  CBC w/ Diff prior to visit, ICD-9: 6)  ESR 7)  Vit B12 782.0 995.20 Prescriptions: SULINDAC 150 MG  TABS (SULINDAC) 1 by mouth two times a day pc  #60 x 11   Entered and Authorized by:   Tresa Garter MD   Signed by:   Tresa Garter MD on 05/18/2008   Method used:   Print then Give to Patient   RxID:   [id number] AMITRIPTYLINE HCL 25 MG TABS (AMITRIPTYLINE HCL) Take two tablet at bedtime  #30 x 11   Entered and Authorized by:   Tresa Garter MD   Signed by:   Tresa Garter MD on 05/18/2008   Method used:   Print then Give to Patient   RxID:   [id number] GABAPENTIN 300 MG CAPS (GABAPENTIN) 1 by mouth two times a day prn  #60 x 6   Entered and Authorized by:   Tresa Garter MD   Signed by:   Tresa Garter MD on 05/18/2008   Method used:   Print then Give to Patient   RxID:   [id number]

## 2010-03-14 NOTE — Assessment & Plan Note (Signed)
Summary: 3 MO ROV /NWS #   Vital Signs:  Patient profile:   77 year old female Weight:      175 pounds BMI:     32.12 Temp:     98.3 degrees F oral Pulse rate:   84 / minute BP sitting:   170 / 100  (left arm)  Vitals Entered By: Tora Perches (March 09, 2009 8:56 AM) CC: f/u Is Patient Diabetic? No   CC:  f/u.  History of Present Illness: F/u elev. BP, OA, GERD. C/o knee pain. F/u LE neuropathy  Preventive Screening-Counseling & Management  Alcohol-Tobacco     Smoking Status: quit  Current Medications (verified): 1)  Sulindac 150 Mg  Tabs (Sulindac) .Marland Kitchen.. 1 By Mouth Two Times A Day Pc 2)  Thioridazine Hcl 10 Mg  Tabs (Thioridazine Hcl) .Marland Kitchen.. 1 By Mouth Q Hs 3)  Amitriptyline Hcl 25 Mg Tabs (Amitriptyline Hcl) .... Take Two Tablet At Bedtime 4)  Vitamin D3 1000 Unit  Tabs (Cholecalciferol) .... 2 Once Daily 5)  B-100 Complex   Tabs (Vitamins-Lipotropics) .Marland Kitchen.. 1 By Mouth Qd 6)  Triamcinolone Acetonide 0.5 % Crea (Triamcinolone Acetonide) .... Apply Bid To Affected Area 7)  Cymbalta 30 Mg Cpep (Duloxetine Hcl) .Marland Kitchen.. 1 By Mouth Qd  Allergies: 1)  ! Naproxen Comfort Pac (Naproxen-Liniment) 2)  ! Lyrica (Pregabalin) 3)  Lyrica 4)  Gabapentin (Gabapentin)  Past History:  Family History: Last updated: 08/19/2007 Family History Hypertension  Past Surgical History: Total knee replacement R Brain aneurism repair  Social History: Retired Former Smoker Alcohol use-no 1 son Married  Review of Systems  The patient denies fever, weight gain, dyspnea on exertion, and headaches.    Physical Exam  General:  overweight-appearing.   Nose:  External nasal examination shows no deformity or inflammation. Nasal mucosa are pink and moist without lesions or exudates. Mouth:  Oral mucosa and oropharynx without lesions or exudates.  Teeth in good repair. Dry skin on lips Lungs:  Normal respiratory effort, chest expands symmetrically. Lungs are clear to auscultation, no  crackles or wheezes. Heart:  Normal rate and regular rhythm. S1 and S2 normal without gallop, murmur, click, rub or other extra sounds. Abdomen:  Bowel sounds positive,abdomen soft and non-tender without masses, organomegaly or hernias noted. Msk:  6 mm bony bump on mid -uper forehead L knee tender w/ROM Neurologic:  No cranial nerve deficits noted. Station and gait are normal. Plantar reflexes are down-going bilaterally. DTRs are symmetrical throughout. Sensory, motor and coordinative functions appear intact. Skin:  Intact without suspicious lesions or rashes Psych:  Cognition and judgment appear intact. Alert and cooperative with normal attention span and concentration. No apparent delusions, illusions, hallucinations   Impression & Recommendations:  Problem # 1:  KNEE PAIN (ICD-719.46) L Assessment Deteriorated Partial TKR is pending at [facility] Her updated medication list for this problem includes:    Sulindac 150 Mg Tabs (Sulindac) .Marland Kitchen... 1 by mouth two times a day pc    Hydrocodone-acetaminophen 5-325 Mg Tabs (Hydrocodone-acetaminophen) .Marland Kitchen... 1-2 by mouth two times a day as needed pain  Problem # 2:  OSTEOARTHRITIS (ICD-715.90) Assessment: Unchanged  Her updated medication list for this problem includes:    Sulindac 150 Mg Tabs (Sulindac) .Marland Kitchen... 1 by mouth two times a day pc    Hydrocodone-acetaminophen 5-325 Mg Tabs (Hydrocodone-acetaminophen) .Marland Kitchen... 1-2 by mouth two times a day as needed pain  Problem # 3:  HYPERTENSION (ICD-401.9) Assessment: Deteriorated  Her updated medication list for this problem includes:  Hydrochlorothiazide 12.5 Mg Caps (Hydrochlorothiazide) .Marland Kitchen... 1 by mouth once daily for blood pressure  Complete Medication List: 1)  Sulindac 150 Mg Tabs (Sulindac) .Marland Kitchen.. 1 by mouth two times a day pc 2)  Thioridazine Hcl 10 Mg Tabs (Thioridazine hcl) .Marland Kitchen.. 1 by mouth q hs 3)  Amitriptyline Hcl 25 Mg Tabs (Amitriptyline hcl) .... Take two tablet at bedtime 4)  Vitamin D3  1000 Unit Tabs (Cholecalciferol) .... 2 once daily 5)  B-100 Complex Tabs (Vitamins-lipotropics) .Marland Kitchen.. 1 by mouth qd 6)  Hydrocodone-acetaminophen 5-325 Mg Tabs (Hydrocodone-acetaminophen) .Marland Kitchen.. 1-2 by mouth two times a day as needed pain 7)  Westcort 0.2 % Oint (Hydrocortisone valerate) .... Use two times a day prn 8)  Hydrochlorothiazide 12.5 Mg Caps (Hydrochlorothiazide) .Marland Kitchen.. 1 by mouth once daily for blood pressure  Patient Instructions: 1)  Please schedule a follow-up appointment in 6 months. 2)  BMP prior to visit, ICD-9: 3)  Hepatic Panel prior to visit, ICD-9: 4)  Lipid Panel prior to visit, ICD-9: 5)  TSH prior to visit, ICD-9:790.29  401.1 995.20  782.0 6)  HbgA1C prior to visit, ICD-9:  Contraindications/Deferment of Procedures/Staging:    Treatment: Flu Shot    Contraindication: N/A     Test/Procedure: Colonoscopy    Reason for deferment: patient declined  Prescriptions: HYDROCHLOROTHIAZIDE 12.5 MG CAPS (HYDROCHLOROTHIAZIDE) 1 by mouth once daily for blood pressure  #30 x 12   Entered and Authorized by:   Tresa Garter MD   Signed by:   Tresa Garter MD on 03/09/2009   Method used:   Print then Give to Patient   RxID:   [id number] WESTCORT 0.2 % OINT (HYDROCORTISONE VALERATE) use two times a day prn  #45 g x 3   Entered and Authorized by:   Tresa Garter MD   Signed by:   Tresa Garter MD on 03/09/2009   Method used:   Print then Give to Patient   RxID:   [id number] HYDROCODONE-ACETAMINOPHEN 5-325 MG TABS (HYDROCODONE-ACETAMINOPHEN) 1-2 by mouth two times a day as needed pain  #100 x 2   Entered and Authorized by:   Tresa Garter MD   Signed by:   Tresa Garter MD on 03/09/2009   Method used:   Print then Give to Patient   RxID:   [id number]

## 2010-05-10 ENCOUNTER — Telehealth: Payer: Self-pay | Admitting: Internal Medicine

## 2010-05-10 ENCOUNTER — Other Ambulatory Visit: Payer: Self-pay | Admitting: Internal Medicine

## 2010-05-10 MED ORDER — SULINDAC 150 MG PO TABS: 150.0000 mg | ORAL_TABLET | Freq: Two times a day (BID) | ORAL | Status: DC

## 2010-05-10 NOTE — Telephone Encounter (Signed)
rf sent 

## 2010-06-06 ENCOUNTER — Telehealth: Payer: Self-pay | Admitting: *Deleted

## 2010-06-06 NOTE — Telephone Encounter (Signed)
OK to fill this prescription with additional refills x5 Thank you!  

## 2010-06-06 NOTE — Telephone Encounter (Signed)
rec Rf req for Amitriptyline 25mg  2 po qhs. # 60  Last filled 02/07/2010

## 2010-06-07 MED ORDER — AMITRIPTYLINE HCL 25 MG PO TABS: 25.0000 mg | ORAL_TABLET | Freq: Every day | ORAL | Status: DC

## 2010-06-30 NOTE — Assessment & Plan Note (Signed)
[facility]                           PRIMARY CARE OFFICE NOTE   NAME:Kristina Wilkinson, Kristina Wilkinson                       MRN:          [id number]  DATE:05/27/2006                            DOB:          07/08/1927    REFERRING PHYSICIAN:  Digby/Rhyne   IM consultation   Req. by Dr. Hazle Quant and DR. Ryan   REASON:  Elevated blood pressure.   HISTORY:  The patient is a 77 year old female who presented today to the  office after her cataract surgery was canceled due to the blood pressure  of 192/87 earlier this morning.  She has no active complaints.  She  denies chest pain, shortness of breath, syncope and such.   PAST MEDICAL HISTORY:  1. History of elevated glucose.  2. Hypertension.   FAMILY HISTORY:  Positive for diabetes.   SOCIAL HISTORY:  Quit smoking a while ago.  She is retired.   CURRENT MEDICATIONS:  1. Zantac one daily.  2. Elavil 25 mg daily.  3. Clinoril one tablet daily.  4. Thioridazine 10 mg daily.   ALLERGIES:  1. NAPROXEN.  2. CYTOTEC.  3. LYRICA, syncope.   REVIEW OF SYSTEMS:  She has lost a lot of weight.  She went from 200  pounds down to 160.  Her sugar was not over 90.  She states her blood  pressure at home was normal.  She stopped taking her blood pressure  medications.  The rest of the 18-point review of systems is negative.   PHYSICAL EXAMINATION:  VITAL SIGNS:  Blood pressure 185/82, pulse 80,  temperature 96.6, weight 160 pounds (was 200).  GENERAL:  She is in no acute distress.  Looks well.  HEENT:  Moist mucosa.  NECK:  Supple.  No thyromegaly or bruit.  LUNGS:  Clear.  No wheezes or rales.  HEART:  Heart sounds S1, S2.  No murmur or gallop.  ABDOMEN:  Soft, nontender.  No organomegaly or masses.  EXTREMITIES:  Lower extremities without edema.  NEUROLOGIC:  She is alert, oriented and cooperative.  Denies being  impressed.  Romberg is negative.  Cranial nerves II-XII nonfocal.  No  pronator drift.   LABORATORY  DATA:  EKG with normal sinus rhythm and PAC's.   LABORATORY DATA:  Labs on February 06, 2006 reviewed.   IMPRESSION/RECOMMENDATIONS:  1. Uncontrolled hypertension.  She was advised to go back on Azor 40/5      one-half daily.  I will see her back in a week or two.  Check blood      pressure at home.  She should be ready for surgery when blood      pressure stabilizes.  2. Type 2 diabetes, diet controlled.  It has improved remarkably since      she lost weight.  Will obtain hemoglobin A1c and other tests.  3. Insomnia.  Continue current therapy.  4. Arthritis.  She stopped taking anti-inflammatories a while ago.     Georgina Quint. Plotnikov, MD  Electronically Signed    AVP/MedQ  DD: 05/29/2006  DT: 05/29/2006  Job #: [id number]   cc:  Chucky May, M.D.  Daryl Rhyne

## 2010-07-18 ENCOUNTER — Other Ambulatory Visit: Payer: Self-pay | Admitting: Internal Medicine

## 2010-07-18 ENCOUNTER — Other Ambulatory Visit (INDEPENDENT_AMBULATORY_CARE_PROVIDER_SITE_OTHER): Payer: Medicare Other

## 2010-07-18 DIAGNOSIS — IMO0002 Reserved for concepts with insufficient information to code with codable children: Secondary | ICD-10-CM

## 2010-07-18 DIAGNOSIS — I10 Essential (primary) hypertension: Secondary | ICD-10-CM

## 2010-07-18 DIAGNOSIS — E119 Type 2 diabetes mellitus without complications: Secondary | ICD-10-CM

## 2010-07-18 LAB — BASIC METABOLIC PANEL
BUN: 20 mg/dL (ref 6–23)
CO2: 27 mEq/L (ref 19–32)
Calcium: 9.5 mg/dL (ref 8.4–10.5)
Chloride: 106 mEq/L (ref 96–112)
Creatinine, Ser: 0.6 mg/dL (ref 0.4–1.2)
GFR: 103.91 mL/min (ref >60.00)
Glucose, Bld: 142 mg/dL — ABNORMAL HIGH (ref 70–99)
Potassium: 4.2 mEq/L (ref 3.5–5.1)
Sodium: 139 mEq/L (ref 135–145)

## 2010-07-18 LAB — HEMOGLOBIN A1C: Hgb A1c MFr Bld: 6.9 % — ABNORMAL HIGH (ref 4.6–6.5)

## 2010-07-25 ENCOUNTER — Encounter: Payer: Self-pay | Admitting: Internal Medicine

## 2010-07-25 ENCOUNTER — Ambulatory Visit (INDEPENDENT_AMBULATORY_CARE_PROVIDER_SITE_OTHER): Payer: Medicare Other | Admitting: Internal Medicine

## 2010-07-25 DIAGNOSIS — E559 Vitamin D deficiency, unspecified: Secondary | ICD-10-CM

## 2010-07-25 DIAGNOSIS — M25511 Pain in right shoulder: Secondary | ICD-10-CM | POA: Insufficient documentation

## 2010-07-25 DIAGNOSIS — M25519 Pain in unspecified shoulder: Secondary | ICD-10-CM

## 2010-07-25 DIAGNOSIS — I10 Essential (primary) hypertension: Secondary | ICD-10-CM

## 2010-07-25 DIAGNOSIS — E119 Type 2 diabetes mellitus without complications: Secondary | ICD-10-CM

## 2010-07-25 DIAGNOSIS — Z0000 Encounter for general adult medical examination without abnormal findings: Secondary | ICD-10-CM

## 2010-07-25 MED ORDER — HYDROCODONE-ACETAMINOPHEN 5-325 MG PO TABS: 1.0000 | ORAL_TABLET | Freq: Two times a day (BID) | ORAL | Status: DC | PRN

## 2010-07-25 NOTE — Assessment & Plan Note (Signed)
On Rx 

## 2010-07-25 NOTE — Assessment & Plan Note (Signed)
  On diet  

## 2010-07-25 NOTE — Assessment & Plan Note (Signed)
On Rx Loose wt and exercise.

## 2010-07-25 NOTE — Progress Notes (Signed)
  Subjective:    Patient ID: Kristina Wilkinson, female    DOB: May 08, 1927, 77 y.o.   MRN: [id number]  HPI  C/o R shoulder hurts F/u DM, HTN, OA Review of Systems  Constitutional: Negative for chills, activity change, appetite change, fatigue and unexpected weight change (wt gain).  HENT: Negative for congestion, mouth sores and sinus pressure.   Eyes: Negative for visual disturbance.  Respiratory: Negative for cough, chest tightness and shortness of breath.   Cardiovascular: Negative for chest pain and palpitations.  Gastrointestinal: Negative for nausea and abdominal pain.  Genitourinary: Negative for frequency, difficulty urinating and vaginal pain.  Musculoskeletal: Positive for arthralgias. Negative for back pain and gait problem.  Skin: Negative for pallor and rash.  Neurological: Negative for dizziness, tremors, weakness, numbness and headaches.  Psychiatric/Behavioral: Negative for suicidal ideas, confusion and sleep disturbance.       Objective:   Physical Exam  Constitutional: She appears well-developed and well-nourished. No distress.       Obese  HENT:  Head: Normocephalic.  Right Ear: External ear normal.  Left Ear: External ear normal.  Nose: Nose normal.  Mouth/Throat: Oropharynx is clear and moist.  Eyes: Conjunctivae are normal. Pupils are equal, round, and reactive to light. Right eye exhibits no discharge. Left eye exhibits no discharge.  Neck: Normal range of motion. Neck supple. No JVD present. No tracheal deviation present. No thyromegaly present.  Cardiovascular: Normal rate, regular rhythm and normal heart sounds.   Pulmonary/Chest: No stridor. No respiratory distress. She has no wheezes.  Abdominal: Soft. Bowel sounds are normal. She exhibits no distension and no mass. There is no tenderness. There is no rebound and no guarding.  Musculoskeletal: She exhibits tenderness (R AC and subacr are tender). She exhibits no edema.  Lymphadenopathy:    She has no  cervical adenopathy.  Neurological: She displays normal reflexes. No cranial nerve deficit. She exhibits normal muscle tone. Coordination normal.  Skin: No rash noted. No erythema.  Psychiatric: She has a normal mood and affect. Her behavior is normal. Judgment and thought content normal.          Assessment & Plan:

## 2010-07-25 NOTE — Assessment & Plan Note (Signed)
On Rx. Will inject if needed in 1-2 mo. On NSAID.

## 2010-08-30 ENCOUNTER — Telehealth: Payer: Self-pay

## 2010-08-30 MED ORDER — CIPROFLOXACIN HCL 500 MG PO TABS: 500.0000 mg | ORAL_TABLET | Freq: Two times a day (BID) | ORAL | Status: DC

## 2010-08-30 NOTE — Telephone Encounter (Signed)
Pls take Cipro To ER if bad OV this or next wk

## 2010-08-30 NOTE — Telephone Encounter (Signed)
Pt c/o pain in chest that goes through to her back. She says she has had gallstones before and thinks that this pain is related to that. Pt refuses to see another md and declines urgent care/er. She is requesting that pcp give Rx for a medication that was given to her before for gallstones but pt does not know the name of the medication. Please advise

## 2010-08-30 NOTE — Telephone Encounter (Signed)
Left detailed mess informing pt of below.  

## 2010-09-05 ENCOUNTER — Encounter: Payer: Self-pay | Admitting: Internal Medicine

## 2010-09-05 ENCOUNTER — Other Ambulatory Visit (INDEPENDENT_AMBULATORY_CARE_PROVIDER_SITE_OTHER): Payer: Medicare Other

## 2010-09-05 ENCOUNTER — Ambulatory Visit (INDEPENDENT_AMBULATORY_CARE_PROVIDER_SITE_OTHER): Payer: Medicare Other | Admitting: Internal Medicine

## 2010-09-05 DIAGNOSIS — R1013 Epigastric pain: Secondary | ICD-10-CM

## 2010-09-05 DIAGNOSIS — K8020 Calculus of gallbladder without cholecystitis without obstruction: Secondary | ICD-10-CM

## 2010-09-05 LAB — COMPREHENSIVE METABOLIC PANEL
ALT: 27 U/L (ref 0–35)
AST: 19 U/L (ref 0–37)
Albumin: 4.1 g/dL (ref 3.5–5.2)
Alkaline Phosphatase: 103 U/L (ref 39–117)
BUN: 22 mg/dL (ref 6–23)
CO2: 26 mEq/L (ref 19–32)
Calcium: 9.9 mg/dL (ref 8.4–10.5)
Chloride: 111 mEq/L (ref 96–112)
Creatinine, Ser: 0.9 mg/dL (ref 0.4–1.2)
GFR: 64.64 mL/min (ref >60.00)
Glucose, Bld: 127 mg/dL — ABNORMAL HIGH (ref 70–99)
Potassium: 4.5 mEq/L (ref 3.5–5.1)
Sodium: 142 mEq/L (ref 135–145)
Total Bilirubin: 0.4 mg/dL (ref 0.3–1.2)
Total Protein: 7.7 g/dL (ref 6.0–8.3)

## 2010-09-05 LAB — CBC WITH DIFFERENTIAL/PLATELET
Basophils Absolute: 0 10*3/uL (ref 0.0–0.1)
Basophils Relative: 0.6 % (ref 0.0–3.0)
Eosinophils Absolute: 0.3 10*3/uL (ref 0.0–0.7)
Eosinophils Relative: 4.2 % (ref 0.0–5.0)
HCT: 36.3 % (ref 36.0–46.0)
Hemoglobin: 12.4 g/dL (ref 12.0–15.0)
Lymphocytes Relative: 26 % (ref 12.0–46.0)
Lymphs Abs: 2 10*3/uL (ref 0.7–4.0)
MCHC: 34.1 g/dL (ref 30.0–36.0)
MCV: 89.5 fl (ref 78.0–100.0)
Monocytes Absolute: 0.6 10*3/uL (ref 0.1–1.0)
Monocytes Relative: 8.5 % (ref 3.0–12.0)
Neutro Abs: 4.6 10*3/uL (ref 1.4–7.7)
Neutrophils Relative %: 60.7 % (ref 43.0–77.0)
Platelets: 221 10*3/uL (ref 150.0–400.0)
RBC: 4.06 Mil/uL (ref 3.87–5.11)
RDW: 12.5 % (ref 11.5–14.6)
WBC: 7.6 10*3/uL (ref 4.5–10.5)

## 2010-09-05 LAB — URINALYSIS
Bilirubin Urine: NEGATIVE
Hgb urine dipstick: NEGATIVE
Ketones, ur: NEGATIVE
Leukocytes, UA: NEGATIVE
Nitrite: NEGATIVE
Specific Gravity, Urine: 1.02 (ref 1.000–1.030)
Total Protein, Urine: NEGATIVE
Urine Glucose: NEGATIVE
Urobilinogen, UA: 0.2 (ref 0.0–1.0)
pH: 5 (ref 5.0–8.0)

## 2010-09-05 LAB — LIPASE: Lipase: 38 U/L (ref 11.0–59.0)

## 2010-09-05 LAB — SEDIMENTATION RATE: Sed Rate: 36 mm/hr — ABNORMAL HIGH (ref 0–22)

## 2010-09-05 MED ORDER — CIPROFLOXACIN HCL 500 MG PO TABS: 500.0000 mg | ORAL_TABLET | Freq: Two times a day (BID) | ORAL | Status: AC

## 2010-09-05 MED ORDER — PROMETHAZINE HCL 12.5 MG PO TABS: 12.5000 mg | ORAL_TABLET | Freq: Four times a day (QID) | ORAL | Status: AC | PRN

## 2010-09-05 MED ORDER — AMITRIPTYLINE HCL 25 MG PO TABS: 50.0000 mg | ORAL_TABLET | Freq: Every day | ORAL | Status: DC

## 2010-09-05 NOTE — Progress Notes (Signed)
  Subjective:    Patient ID: Kristina Wilkinson, female    DOB: 07-05-1927, 77 y.o.   MRN: [id number]  HPI C/o abd pain since last Tue - epig burning pain 8/10 all night, no nausea. She felt hot. It lasted for 1 d. Took TUMs - they helped. No abd pain now/\.   Review of Systems  Constitutional: Negative for chills, activity change, appetite change, fatigue and unexpected weight change.  HENT: Negative for congestion, mouth sores and sinus pressure.   Eyes: Negative for visual disturbance.  Respiratory: Negative for cough and chest tightness.   Gastrointestinal: Negative for nausea, abdominal pain, blood in stool and abdominal distention.  Genitourinary: Negative for frequency, difficulty urinating and vaginal pain.  Musculoskeletal: Negative for back pain and gait problem.  Skin: Negative for pallor and rash.  Neurological: Negative for dizziness, tremors, weakness, numbness and headaches.  Psychiatric/Behavioral: Negative for confusion and sleep disturbance.       Objective:   Physical Exam  Constitutional: She appears well-developed. No distress.       Obese NAD  HENT:  Head: Normocephalic.  Right Ear: External ear normal.  Left Ear: External ear normal.  Nose: Nose normal.  Mouth/Throat: Oropharynx is clear and moist.  Eyes: Conjunctivae are normal. Pupils are equal, round, and reactive to light. Right eye exhibits no discharge. Left eye exhibits no discharge.  Neck: Normal range of motion. Neck supple. No JVD present. No tracheal deviation present. No thyromegaly present.  Cardiovascular: Normal rate, regular rhythm and normal heart sounds.   Pulmonary/Chest: No stridor. No respiratory distress. She has no wheezes.  Abdominal: Soft. Bowel sounds are normal. She exhibits no distension and no mass. There is no tenderness. There is no rebound and no guarding.  Musculoskeletal: She exhibits no edema and no tenderness.  Lymphadenopathy:    She has no cervical adenopathy.    Neurological: She displays normal reflexes. No cranial nerve deficit. She exhibits normal muscle tone. Coordination normal.  Skin: No rash noted. No erythema.  Psychiatric: She has a normal mood and affect. Her behavior is normal. Judgment and thought content normal.        Procedure Note :    Procedure :   Point of care sonography examination   Indication: abd pain, nausea   Equipment used: Sonosite M-Turbo with P21x/5-1 MHz transducer phased array probe. The images were stored in the unit and later transferred in storage.  The patient was placed in a decubitus position. No preparation  This study revealed large gallstones in her distended GB. No lesions in the liver. Poorly visualised CBD.   Impression: Gall stones      Assessment & Plan:   Labs ordered

## 2010-09-05 NOTE — Patient Instructions (Signed)
Go to ER if bad 

## 2010-09-06 ENCOUNTER — Telehealth: Payer: Self-pay | Admitting: Internal Medicine

## 2010-09-06 NOTE — Telephone Encounter (Signed)
Left detailed mess informing pt of below.  

## 2010-09-06 NOTE — Assessment & Plan Note (Signed)
Cipro if pain re-occurres

## 2010-09-06 NOTE — Telephone Encounter (Signed)
Kristina Wilkinson, please, inform patient that all labs are normal except for a little elev glu Thx

## 2010-09-06 NOTE — Assessment & Plan Note (Signed)
Full abd [place]

## 2010-09-12 ENCOUNTER — Ambulatory Visit (HOSPITAL_COMMUNITY)
Admission: RE | Admit: 2010-09-12 | Discharge: 2010-09-12 | Disposition: A | Discharge status: Home / Self Care | Payer: Medicare Other | Source: Ambulatory Visit | Attending: Internal Medicine | Admitting: Internal Medicine

## 2010-09-12 DIAGNOSIS — R1013 Epigastric pain: Secondary | ICD-10-CM | POA: Insufficient documentation

## 2010-09-12 DIAGNOSIS — K8020 Calculus of gallbladder without cholecystitis without obstruction: Secondary | ICD-10-CM | POA: Insufficient documentation

## 2010-09-15 ENCOUNTER — Telehealth: Payer: Self-pay | Admitting: *Deleted

## 2010-09-15 NOTE — Telephone Encounter (Signed)
Patient requesting results of u/s.

## 2010-09-16 NOTE — Telephone Encounter (Signed)
Gallstones are confirmed. Otherwise OK. Thx

## 2010-09-17 ENCOUNTER — Inpatient Hospital Stay (HOSPITAL_COMMUNITY)
Admission: EM | Admit: 2010-09-17 | Discharge: 2010-09-19 | DRG: 440 | Discharge status: Against Medical Advice | Payer: Medicare Other | Attending: Internal Medicine | Admitting: Internal Medicine

## 2010-09-17 ENCOUNTER — Emergency Department (HOSPITAL_COMMUNITY): Payer: Medicare Other

## 2010-09-17 DIAGNOSIS — I10 Essential (primary) hypertension: Secondary | ICD-10-CM | POA: Diagnosis present

## 2010-09-17 DIAGNOSIS — F3289 Other specified depressive episodes: Secondary | ICD-10-CM | POA: Diagnosis present

## 2010-09-17 DIAGNOSIS — Z96659 Presence of unspecified artificial knee joint: Secondary | ICD-10-CM

## 2010-09-17 DIAGNOSIS — K8020 Calculus of gallbladder without cholecystitis without obstruction: Secondary | ICD-10-CM | POA: Diagnosis present

## 2010-09-17 DIAGNOSIS — F329 Major depressive disorder, single episode, unspecified: Secondary | ICD-10-CM | POA: Diagnosis present

## 2010-09-17 DIAGNOSIS — M129 Arthropathy, unspecified: Secondary | ICD-10-CM | POA: Diagnosis present

## 2010-09-17 DIAGNOSIS — Z79899 Other long term (current) drug therapy: Secondary | ICD-10-CM

## 2010-09-17 DIAGNOSIS — K859 Acute pancreatitis without necrosis or infection, unspecified: Principal | ICD-10-CM | POA: Diagnosis present

## 2010-09-17 LAB — COMPREHENSIVE METABOLIC PANEL
ALT: 88 U/L — ABNORMAL HIGH (ref 0–35)
AST: 105 U/L — ABNORMAL HIGH (ref 0–37)
Albumin: 3.4 g/dL — ABNORMAL LOW (ref 3.5–5.2)
Alkaline Phosphatase: 118 U/L — ABNORMAL HIGH (ref 39–117)
BUN: 25 mg/dL — ABNORMAL HIGH (ref 6–23)
CO2: 27 mEq/L (ref 19–32)
Calcium: 10 mg/dL (ref 8.4–10.5)
Chloride: 102 mEq/L (ref 96–112)
Creatinine, Ser: 0.7 mg/dL (ref 0.50–1.10)
GFR calc Af Amer: >60 mL/min (ref >60)
GFR calc non Af Amer: >60 mL/min (ref >60)
Glucose, Bld: 220 mg/dL — ABNORMAL HIGH (ref 70–99)
Potassium: 3.7 mEq/L (ref 3.5–5.1)
Sodium: 137 mEq/L (ref 135–145)
Total Bilirubin: 1.9 mg/dL — ABNORMAL HIGH (ref 0.3–1.2)
Total Protein: 6.6 g/dL (ref 6.0–8.3)

## 2010-09-17 LAB — DIFFERENTIAL
Basophils Absolute: 0 10*3/uL (ref 0.0–0.1)
Basophils Relative: 0 % (ref 0–1)
Eosinophils Absolute: 0.1 10*3/uL (ref 0.0–0.7)
Eosinophils Relative: 1 % (ref 0–5)
Lymphocytes Relative: 11 % — ABNORMAL LOW (ref 12–46)
Lymphs Abs: 1.1 10*3/uL (ref 0.7–4.0)
Monocytes Absolute: 0.8 10*3/uL (ref 0.1–1.0)
Monocytes Relative: 7 % (ref 3–12)
Neutro Abs: 8.7 10*3/uL — ABNORMAL HIGH (ref 1.7–7.7)
Neutrophils Relative %: 81 % — ABNORMAL HIGH (ref 43–77)

## 2010-09-17 LAB — CBC
HCT: 34.1 % — ABNORMAL LOW (ref 36.0–46.0)
Hemoglobin: 12 g/dL (ref 12.0–15.0)
MCH: 30.3 pg (ref 26.0–34.0)
MCHC: 35.2 g/dL (ref 30.0–36.0)
MCV: 86.1 fL (ref 78.0–100.0)
Platelets: 182 10*3/uL (ref 150–400)
RBC: 3.96 MIL/uL (ref 3.87–5.11)
RDW: 12.3 % (ref 11.5–15.5)
WBC: 10.8 10*3/uL — ABNORMAL HIGH (ref 4.0–10.5)

## 2010-09-17 LAB — LIPASE, BLOOD: Lipase: >3000 U/L — ABNORMAL HIGH (ref 11–59)

## 2010-09-17 LAB — TROPONIN I: Troponin I: <0.3 ng/mL (ref <0.30)

## 2010-09-18 DIAGNOSIS — K859 Acute pancreatitis without necrosis or infection, unspecified: Secondary | ICD-10-CM

## 2010-09-18 DIAGNOSIS — K8020 Calculus of gallbladder without cholecystitis without obstruction: Secondary | ICD-10-CM

## 2010-09-18 LAB — CBC
HCT: 32 % — ABNORMAL LOW (ref 36.0–46.0)
HCT: 31.2 % — ABNORMAL LOW (ref 36.0–46.0)
Hemoglobin: 11.3 g/dL — ABNORMAL LOW (ref 12.0–15.0)
Hemoglobin: 10.8 g/dL — ABNORMAL LOW (ref 12.0–15.0)
MCH: 30.1 pg (ref 26.0–34.0)
MCH: 29.8 pg (ref 26.0–34.0)
MCHC: 35.3 g/dL (ref 30.0–36.0)
MCHC: 34.6 g/dL (ref 30.0–36.0)
MCV: 85.3 fL (ref 78.0–100.0)
MCV: 86.2 fL (ref 78.0–100.0)
Platelets: 195 10*3/uL (ref 150–400)
Platelets: 179 10*3/uL (ref 150–400)
RBC: 3.75 MIL/uL — ABNORMAL LOW (ref 3.87–5.11)
RBC: 3.62 MIL/uL — ABNORMAL LOW (ref 3.87–5.11)
RDW: 12.5 % (ref 11.5–15.5)
RDW: 12.5 % (ref 11.5–15.5)
WBC: 8.7 10*3/uL (ref 4.0–10.5)
WBC: 10.1 10*3/uL (ref 4.0–10.5)

## 2010-09-18 LAB — GLUCOSE, CAPILLARY
Glucose-Capillary: 111 mg/dL — ABNORMAL HIGH (ref 70–99)
Glucose-Capillary: 93 mg/dL (ref 70–99)
Glucose-Capillary: 110 mg/dL — ABNORMAL HIGH (ref 70–99)

## 2010-09-18 LAB — HEPATIC FUNCTION PANEL
ALT: 96 U/L — ABNORMAL HIGH (ref 0–35)
AST: 76 U/L — ABNORMAL HIGH (ref 0–37)
Albumin: 3 g/dL — ABNORMAL LOW (ref 3.5–5.2)
Alkaline Phosphatase: 130 U/L — ABNORMAL HIGH (ref 39–117)
Bilirubin, Direct: 0.5 mg/dL — ABNORMAL HIGH (ref 0.0–0.3)
Indirect Bilirubin: 0.9 mg/dL (ref 0.3–0.9)
Total Bilirubin: 1.4 mg/dL — ABNORMAL HIGH (ref 0.3–1.2)
Total Protein: 6 g/dL (ref 6.0–8.3)

## 2010-09-18 LAB — BASIC METABOLIC PANEL
BUN: 16 mg/dL (ref 6–23)
CO2: 25 mEq/L (ref 19–32)
Calcium: 9 mg/dL (ref 8.4–10.5)
Chloride: 108 mEq/L (ref 96–112)
Creatinine, Ser: 0.47 mg/dL — ABNORMAL LOW (ref 0.50–1.10)
GFR calc Af Amer: >60 mL/min (ref >60)
GFR calc non Af Amer: >60 mL/min (ref >60)
Glucose, Bld: 101 mg/dL — ABNORMAL HIGH (ref 70–99)
Potassium: 3.3 mEq/L — ABNORMAL LOW (ref 3.5–5.1)
Sodium: 139 mEq/L (ref 135–145)

## 2010-09-18 LAB — PROTIME-INR
INR: 1.11 (ref 0.00–1.49)
Prothrombin Time: 14.5 seconds (ref 11.6–15.2)

## 2010-09-18 LAB — LIPASE, BLOOD
Lipase: >3000 U/L — ABNORMAL HIGH (ref 11–59)
Lipase: 2893 U/L — ABNORMAL HIGH (ref 11–59)

## 2010-09-18 LAB — COMPREHENSIVE METABOLIC PANEL
ALT: 101 U/L — ABNORMAL HIGH (ref 0–35)
AST: 90 U/L — ABNORMAL HIGH (ref 0–37)
Albumin: 2.9 g/dL — ABNORMAL LOW (ref 3.5–5.2)
Alkaline Phosphatase: 123 U/L — ABNORMAL HIGH (ref 39–117)
BUN: 17 mg/dL (ref 6–23)
CO2: 24 mEq/L (ref 19–32)
Calcium: 9 mg/dL (ref 8.4–10.5)
Chloride: 107 mEq/L (ref 96–112)
Creatinine, Ser: 0.51 mg/dL (ref 0.50–1.10)
GFR calc Af Amer: >60 mL/min (ref >60)
GFR calc non Af Amer: >60 mL/min (ref >60)
Glucose, Bld: 108 mg/dL — ABNORMAL HIGH (ref 70–99)
Potassium: 3.6 mEq/L (ref 3.5–5.1)
Sodium: 139 mEq/L (ref 135–145)
Total Bilirubin: 2 mg/dL — ABNORMAL HIGH (ref 0.3–1.2)
Total Protein: 6 g/dL (ref 6.0–8.3)

## 2010-09-18 LAB — AMYLASE: Amylase: 1450 U/L — ABNORMAL HIGH (ref 0–105)

## 2010-09-18 LAB — HEMOGLOBIN A1C
Hgb A1c MFr Bld: 6.7 % — ABNORMAL HIGH (ref <5.7)
Mean Plasma Glucose: 146 mg/dL — ABNORMAL HIGH (ref <117)

## 2010-09-18 LAB — APTT: aPTT: 27 seconds (ref 24–37)

## 2010-09-18 NOTE — Telephone Encounter (Signed)
Left vm for pt

## 2010-09-19 ENCOUNTER — Telehealth: Payer: Self-pay | Admitting: *Deleted

## 2010-09-19 DIAGNOSIS — K819 Cholecystitis, unspecified: Secondary | ICD-10-CM

## 2010-09-19 LAB — COMPREHENSIVE METABOLIC PANEL
ALT: 74 U/L — ABNORMAL HIGH (ref 0–35)
AST: 41 U/L — ABNORMAL HIGH (ref 0–37)
Albumin: 3.1 g/dL — ABNORMAL LOW (ref 3.5–5.2)
Alkaline Phosphatase: 136 U/L — ABNORMAL HIGH (ref 39–117)
BUN: 11 mg/dL (ref 6–23)
CO2: 23 mEq/L (ref 19–32)
Calcium: 9.1 mg/dL (ref 8.4–10.5)
Chloride: 107 mEq/L (ref 96–112)
Creatinine, Ser: 0.49 mg/dL — ABNORMAL LOW (ref 0.50–1.10)
GFR calc Af Amer: >60 mL/min (ref >60)
GFR calc non Af Amer: >60 mL/min (ref >60)
Glucose, Bld: 91 mg/dL (ref 70–99)
Potassium: 3.5 mEq/L (ref 3.5–5.1)
Sodium: 139 mEq/L (ref 135–145)
Total Bilirubin: 0.7 mg/dL (ref 0.3–1.2)
Total Protein: 6.3 g/dL (ref 6.0–8.3)

## 2010-09-19 LAB — LIPID PANEL
Cholesterol: 142 mg/dL (ref 0–200)
HDL: 52 mg/dL (ref >39)
LDL Cholesterol: 78 mg/dL (ref 0–99)
Total CHOL/HDL Ratio: 2.7 RATIO
Triglycerides: 60 mg/dL (ref <150)
VLDL: 12 mg/dL (ref 0–40)

## 2010-09-19 LAB — GLUCOSE, CAPILLARY
Glucose-Capillary: 119 mg/dL — ABNORMAL HIGH (ref 70–99)
Glucose-Capillary: 86 mg/dL (ref 70–99)
Glucose-Capillary: 109 mg/dL — ABNORMAL HIGH (ref 70–99)

## 2010-09-19 LAB — CBC
HCT: 32.1 % — ABNORMAL LOW (ref 36.0–46.0)
Hemoglobin: 11 g/dL — ABNORMAL LOW (ref 12.0–15.0)
MCH: 29.9 pg (ref 26.0–34.0)
MCHC: 34.3 g/dL (ref 30.0–36.0)
MCV: 87.2 fL (ref 78.0–100.0)
Platelets: 178 10*3/uL (ref 150–400)
RBC: 3.68 MIL/uL — ABNORMAL LOW (ref 3.87–5.11)
RDW: 12.7 % (ref 11.5–15.5)
WBC: 11.8 10*3/uL — ABNORMAL HIGH (ref 4.0–10.5)

## 2010-09-19 LAB — AMYLASE: Amylase: 602 U/L — ABNORMAL HIGH (ref 0–105)

## 2010-09-19 NOTE — H&P (Signed)
NAMESHANTY, GINTY                ACCOUNT NO.:  [id number]  MEDICAL RECORD NO.:  [id number]  LOCATION:  3003                         FACILITY:  [facility]  PHYSICIAN:  Tarry Kos, MD       DATE OF BIRTH:  06/04/1927  DATE OF ADMISSION:  09/17/2010 DATE OF DISCHARGE:                             HISTORY & PHYSICAL   CHIEF COMPLAINT:  Epigastric abdominal pain.  HISTORY OF PRESENT ILLNESS:  Ms. Schnitzer is a very pleasant 77 year old female who still has her gallbladder who presents to the emergency department after suffering from acute onset of sudden epigastric abdominal pain that started this morning at 10:30 a.m.  She has had biliary colic in the past and has already known to have a lot of gallstones and was actually in the process of getting her gallbladder out.  She comes in today though with severe epigastric abdominal pain that seems to have mostly resolved already.  She was very nauseous with it.  Denies any vomiting.  Denies any diarrhea.  Her pain is much better now after receiving some Dilaudid.  It sounds like she has passed a stone and the majority of the pain has passed.  GI has been called who will see her in the morning.  We are being asked to admit the patient as she has acute pancreatitis from this for management of that.  REVIEW OF SYSTEMS:  Otherwise negative.  PAST MEDICAL HISTORY: 1. She has an aneurysm status post clipping, intracranial aneurysm     status post clipping. 2. Arthritis. 3. Hypertension. 4. Sounds like biliary colic, she has had at least one episode of     biliary colic in the past.  SOCIAL HISTORY:  She is a nonsmoker.  No alcohol.  No IV drug abuse.  MEDICATIONS:  Amitriptyline, Cipro, sulindac, and HCTZ.  ALLERGIES: 1. LYRICA. 2. SULFA.  PHYSICAL EXAMINATION:  VITAL SIGNS:  Temperature is 98, blood pressure 191/59, pulse 69, respirations 16, and 100% on room air. GENERAL:  She is alert and oriented x4.  No apparent  distress, cooperative and friendly. HEENT:  Extraocular muscles are intact.  Pupils equal and reactive to light.  Oropharynx clear.  Moist mucous membranes. NECK:  No JVD is noted.  No carotid bruits. COR:  Regular rate and rhythm without murmurs, rubs, or gallops. CHEST:  Clear to auscultation bilaterally.  No wheeze, rhonchi, or rales. ABDOMEN:  Mild pain in the epigastric area.  Soft and nondistended. Positive bowel sounds.  No rebound.  No guarding.  Nonacute abdomen. EXTREMITIES:  No clubbing, cyanosis, or edema. PSYCHIATRIC:  Normal mood and affect. NEUROLOGIC:  No focal neurologic deficits.  Her lipase is over 3000.  Troponin is negative.  Her LFTs are elevated with an alk phos of 118, AST 105, ALT of 88, total bili is also elevated at 1.9.  Her BUN and creatinine are 25 and 0.7.  Electrolytes are otherwise normal.  Her white count is 10.8, hemoglobin is 12.  Abdominal ultrasound is pending.  Chest x-ray is negative.  ASSESSMENT AND PLAN:  This is an 77 year old female with acute gallstone pancreatitis and choledocholithiasis.1. Acute gallstone pancreatitis secondary to choledocholithiasis.  We  will provide her with some IV pain medications, aggressive IV     fluids, keep her n.p.o.  Her pain seems to be mostly relieved right     now, but she is still having some occasional epigastric pain.  GI     has been notified, will see the patient in a.m. to recommend     whether or not proceeding with MRCP or ERCP.  I suspect she will     likely need to have her gallbladder removed at some point; however,     we will not get Surgery involved right now as she probably will     need several days to calm her pancreas down before any other     intervention. 2. Hypertension.  Uncontrolled.  We will provide some hydralazine as     needed for systolic pressure over 180, diastolic blood pressure     over 110. 3. The patient is a full code.  Further recommendations depending on      overall hospital course.          ______________________________ Tarry Kos, MD     RD/MEDQ  D:  09/17/2010  T:  09/18/2010  Job:  [id number]  Electronically Signed by Tarry Kos MD on 09/19/2010 09:56:10 PM

## 2010-09-19 NOTE — Telephone Encounter (Signed)
Who does she want to see? Thx

## 2010-09-19 NOTE — Telephone Encounter (Signed)
Patient requesting referral to a surgeon to have gallbladder removed.

## 2010-09-19 NOTE — Consult Note (Signed)
NAMEKAYCI, BELLEVILLE                ACCOUNT NO.:  [id number]  MEDICAL RECORD NO.:  [id number]  LOCATION:  3003                         FACILITY:  [facility]  PHYSICIAN:  Cherylynn Ridges, M.D.    DATE OF BIRTH:  1927-06-28  DATE OF CONSULTATION: DATE OF DISCHARGE:                                CONSULTATION   Dear Dr. Venetia Constable:  Thank you very much for asking me to see Ms. Yost, a very pleasant 77- year-old female who was admitted with gallstone pancreatitis and epigastric abdominal pain.  By report the patient has had symptoms of gallstones for at least 4 years when she had a previous very bad attack.  She was confirmed to have gallstones at that time, however, after the singular attack had not had any symptoms until 10:30 yesterday morning when she developed the severe epigastric abdominal pain, brought into the emergency room about 3 o'clock in the afternoon.  She had no nausea, vomiting, no fevers or chills, no jaundice, but just epigastric pain which began to resolve shortly after that with administration of some pain medications and bowel rest.  PAST MEDICAL HISTORY:  Significant for: 1. Aneurysm clipping in 1983 and repeat surgery in 1987.  It sounds     like the completion of the aneurysm clip was done in 1987. 2. Hypertension for which she takes a diuretic. 3. Arthritis.  PAST SURGICAL HISTORY: 1. She has had a craniotomy with clipping of aneurysm x2. 2. She has had a complete total right knee and a partial left total     knee, last surgery was about a year ago. 3. She has had a tubal ligation.  That is the only abdominal surgery     that she has had.  MEDICATIONS: 1. Clinoril. 2. Hydrochlorothiazide. 3. Amitriptyline that she takes for some questionable restless leg     syndrome and/or sleep.  ALLERGIES:  She is allergic to SULFA medications and LYRICA.  REVIEW OF SYSTEMS:  She has had no diarrhea.  No blood in her stools. No jaundice.  No fevers or chills and no  nausea, vomiting.  PHYSICAL EXAMINATION:  VITAL SIGNS:  Stable.  She is afebrile with a temperature of 98.1, a pulse of 78, blood pressure 127/73. HEENT:  She is normocephalic and atraumatic and anicteric. NECK:  Supple.  She has no bruits. CARDIAC:  Regular rhythm and rate with no murmurs. LUNGS:  Clear to auscultation. ABDOMEN:  Soft.  She has no epigastric, right upper quadrant, or left upper quadrant tenderness.  She has got normoactive bowel sounds.  No rebound or guarding.  No tenderness. RECTAL:  Not performed. NEUROLOGIC:  Cranial nerves II-XII are grossly intact. EXTREMITIES:  Display no symptoms or signs of deep venous thrombosis. No cyanosis, clubbing, or edema.  LABORATORY STUDIES:  Her one lipase is over 3000 again today.  It was over 3000 yesterday done at 5 o'clock in the evening.  Her liver function tests are still elevated.  Her total bilirubin is up to 2.0 from 1.9.  Alk phos is mildly elevated at 120.  The SGPT and SGOT are about the same they were yesterday.  Her calcium is 10.0.  PT/INR is  normal.  PTT is normal.  Her white blood cell count is 8.7, hemoglobin 11.3, hematocrit 32, platelet count is 195,000.  I have reviewed the results of her ultrasound which demonstrates gallstones.  IMPRESSION: 1. Gallstone pancreatitis with persistently elevated liver function     tests and amylase.  However, the patient is asymptomatic and has no     back or upper abdominal epigastric tenderness. 2. History of arthritis and aneurysm clipping and hypertension all of     which appear to be stable.  PLAN:  Because of persistently elevated liver function tests, there is a possibility that the patient may need to have an endoscopic retrograde cholangiopancreatogram prior to surgery.  However, with her lipase being so significantly elevated, some risk for possible exacerbation of her pancreatitis does exist.  I do think a GI consultation is warranted. However, I do not believe  that we go ahead do surgery at this time until her lipase should come down for a while.  In the meantime because she is minimally symptomatic, I do think she can take some clear liquids but I would not advance her diet beyond that.  She has been started on IV antibiotics by her medical team, however, I do not believe that is warranted based just on her pancreatitis alone and it does not appear as though she has acute cholecystitis.     Cherylynn Ridges, M.D.     JOW/MEDQ  D:  09/18/2010  T:  09/18/2010  Job:  [id number]  cc:   Conley Canal, MD  Electronically Signed by Jimmye Norman M.D. on 09/19/2010 04:48:46 PM

## 2010-09-20 NOTE — Telephone Encounter (Signed)
Pt wants MD to refer, does not have anyone in mind but "does not want a black doctor"

## 2010-09-21 NOTE — Telephone Encounter (Signed)
Noted. I made a ref.  ( I disagree with her convictions, but it is not illegal for her to have them). Thx

## 2010-09-22 NOTE — Consult Note (Signed)
NAMEZENIAH, Kristina Wilkinson                ACCOUNT NO.:  [id number]  MEDICAL RECORD NO.:  [id number]  LOCATION:  3003                         FACILITY:  [facility]  PHYSICIAN:  Willis Modena, MD     DATE OF BIRTH:  October 29, 1927  DATE OF CONSULTATION:  09/18/2010 DATE OF DISCHARGE:                                CONSULTATION   REASON FOR CONSULTATION:  Gallstone pancreatitis.  CHIEF COMPLAINT:  Abdominal pain.  HISTORY OF PRESENT ILLNESS:  Kristina Wilkinson is a very pleasant and very healthy 77 year old female who was admitted to the hospital for pancreatitis.  Apparently, for the past 4 years or so, she has had intermittent bouts of epigastric pain, usually postprandial, associated with nausea, vomiting.  She has had a lot more frequent episodes of late and ultimately presented to the hospital for evaluation.  She was found on laboratory studies to have elevated liver tests with pancreatitis. She had an abdominal ultrasound that showed some gallbladder wall thickening as well as pericholecystic fluid.  Her bile duct was 8 mm. There were gallstones seen in her gallbladder but no obvious stones were seen in her extrahepatic biliary tree.  At this time, Kristina Wilkinson feels much better.  Her pain is resolved and she actually feels a little bit hungry.  Past medical history, past surgical history, home medications, allergies, family history, social history, review of systems all from the dictated note from Dr. Tarry Kos dated September 17, 2010, I have reviewed and I agree.  PHYSICAL EXAMINATION:  VITAL SIGNS:  Blood pressure 127/73, heart rate 78, respiratory rate 22, temperature 98.1, oxygen saturation 98% on room air. GENERAL:  Kristina Wilkinson is elderly, but much younger appearing than stated age.  She is in no acute distress, nontoxic appearing. ENT:  Normocephalic, atraumatic.  No oropharyngeal lesions. EYES:  Sclerae are slightly icteric.  Conjunctivae are pink. NECK:  Supple. LUNGS:  Clear. HEART:   Regular. ABDOMEN:  Soft.  No appreciable tenderness.  No Murphy sign.  Bowel sounds are normoactive.  No liver or splenic enlargement.  No bulging flanks to suggest ascites. EXTREMITIES:  No peripheral cyanosis, clubbing or edema. PSYCHIATRIC:  Normal mood and affect. NEUROLOGIC:  Nonfocal without lateralizing signs. SKIN:  Occasional ecchymoses.  No rash. LYMPHATICS:  No palpable axillary, submandibular, or supraclavicular adenopathy.  LABORATORY STUDIES:  Lipase was over 3000, currently over 3000 as well, bilirubin 2.0, alk phos 123, AST 90, ALT 101, those are all about the same as they were yesterday.  White count is 10.1, hemoglobin 10.8, platelet count is 179.  RADIOLOGIC STUDIES:  As mentioned include abdominal ultrasound that showed gallbladder wall thickening with gallstones possible pericholecystic fluid, negative sonographic Murphy sign.  Common bile duct was 8 mm with no obvious stones.  IMPRESSION:  Kristina Wilkinson is an 77 year old female presenting with gallstone pancreatitis.  She has markedly improved clinically.  PLAN: 1. Agree with supportive management with only clear liquid diet, IV     fluids, pain medicines as needed. 2. Surgery has seen Kristina Wilkinson and is tentatively planning on taking     her to the operating room the next day or 2. 3. Despite her elevated lipase of over  3000, clinically, she is     essentially asymptomatic. 4. ERCP may or may not be needed depending on what her intraoperative     cholangiogram shows.  We will follow along and wait for that result     prior to taking any  further targeted action.  Thank you for allowing me to participate in Kristina Wilkinson's care.     Willis Modena, MD     WO/MEDQ  D:  09/18/2010  T:  09/19/2010  Job:  [id number]  Electronically Signed by Willis Modena  on 09/22/2010 07:24:30 PM

## 2010-09-23 NOTE — Discharge Summary (Signed)
  [name], [name]                ACCOUNT NO.:  [id number]  MEDICAL RECORD NO.:  [id number]  LOCATION:                                 FACILITY:  PHYSICIAN:  Conley Canal, MD      DATE OF BIRTH:  [birth year]  DATE OF ADMISSION:  09/18/2010 DATE OF DISCHARGE:  09/20/2010                        DISCHARGE SUMMARY - REFERRING   CONSULTING PHYSICIANS:  Willis Modena, MD and Cherylynn Ridges, MD  DISCHARGE DIAGNOSES: 1. Gallstone pancreatitis. 2. Essential hypertension. 3. Arthritis. 4. Brain aneurysm status post clipping.  PROCEDURES PERFORMED: 1. Ultrasound abdomen September 17, 2010, showed cholelithiasis with     irregular gallbladder wall thickening and pericholecystic fluid     worrisome for acute cholecystitis but negative sonographic Murphy     sign. 2. Chest x-ray on September 17, 2010, showed no evidence of acute     cardiopulmonary disease.  HOSPITAL COURSE:  Kristina Wilkinson was admitted on September 18, 2010, with complaints of epigastric abdominal pain and found to have gallstone pancreatitis.  The patient was seen by Gastroenterology and Surgery, Dr. Willis Modena and Dr. Jimmye Norman respectively who graciously helped with the management.  There were plans for laparoscopic cholecystectomy but the patient decided to leave against medical advice although understanding the gravity of her condition.  She understood the risk of morbidity without the symptomatic cholelithiasis not being addressed including death, but she wanted to leave against medical advise to follow with her primary care provider for possibility of a second opinion.  On the day of discharge, her amylase was 602, total bilirubin 0.7, alkaline phosphatase 136, AST 41, ALT 74.     Conley Canal, MD     SR/MEDQ  D:  09/20/2010  T:  09/20/2010  Job:  [id number]  cc:   Cherylynn Ridges, M.D. Willis Modena, MD Chucky May, M.D. Georgina Quint. Plotnikov, MD  Electronically Signed by Conley Canal  on 09/23/2010  04:02:23 PM

## 2010-10-09 ENCOUNTER — Encounter (INDEPENDENT_AMBULATORY_CARE_PROVIDER_SITE_OTHER): Payer: Self-pay | Admitting: General Surgery

## 2010-10-09 ENCOUNTER — Ambulatory Visit (INDEPENDENT_AMBULATORY_CARE_PROVIDER_SITE_OTHER): Payer: Medicare Other | Admitting: General Surgery

## 2010-10-09 DIAGNOSIS — K8020 Calculus of gallbladder without cholecystitis without obstruction: Secondary | ICD-10-CM

## 2010-10-09 NOTE — Assessment & Plan Note (Signed)
Has had additional pain episodes, but is currently asymptomatic.   I advised her that with increasing frequency of attacks, it is unlikely that she will be able to avoid cholecystectomy. Her AST/ALT/ALP were slightly elevated at last check which likely indicates that she is passing stones.   Pt would still like to avoid surgery.  She states that she has a brain aneurysm and she does not want to be put to sleep if possible. She is going to call if she wants to schedule surgery, and if not, I still will see her back in 3 months to assess her symptoms.   I reviewed the indications for going to the ER for acute cholecystitis or choledocholithiasis.  I advised her that the gallbladder can make her very sick and that surgery may be harder if she waits until that point.    The surgical procedure was described to the patient in detail.  The patient was given Agricultural engineer. .  I discussed the incision type and location, the location of the gallbladder, the anatomy of the bile ducts and arteries, and the typical progression of surgery.  I discussed the possibility of converting to an open operation.  I advised of the risks of bleeding, infection, damage to other structures (such as the bile duct, intestine or liver), bile leak, need for other procedures or surgeries, and post op diarrhea/constipation.  We discussed the risk of blood clot.  We discussed the recovery period and post operative restrictions.

## 2010-10-09 NOTE — Progress Notes (Signed)
Chief Complaint  Patient presents with  . Other    new pt- eval GB and pancreatitis    HISTORY: Patient is a 77 year old female who has had several attacks of abdominal pain in the epigastric location. She had her first attack around 4 years ago that was around 4-5/10 at worst. She has not been bothered at all until the last month. She has had 3 episodes in the last month that were also reasonably mild. The pain has lasted around several hours. One of them was after eating cereal but the other 2 were in the afternoon. She does not recall which she ate 4 years ago. She has not had any nausea and vomiting. When she has an episode of pain, it is associated with a feeling of reflux and a need to belch. She also felt bloated when she has had episodes of pain. She denies diarrhea or constipation.  Past Medical History  Diagnosis Date  . PN (peripheral neuropathy)     unknown etiol  . GERD (gastroesophageal reflux disease)   . Hypertension   . Elevated glucose   . Insomnia   . Diabetes mellitus type II     diet controlled   . Knee pain     Left Dr. Tomasa Blase  . LBP (low back pain)   . Arthritis   . Aneurysm    Past Surgical History  Procedure Date  . Total knee arthroplasty   . Cerebral aneurysm repair   . Knee arthroplasty 04/2009    Left  . Cerebral aneurysm repair   . Tubal ligation   . Joint replacement      Current Outpatient Prescriptions  Medication Sig Dispense Refill  . amitriptyline (ELAVIL) 25 MG tablet Take 2 tablets (50 mg total) by mouth at bedtime.  180 tablet  2  . cholecalciferol (VITAMIN D) 1000 UNITS tablet Take 2,000 Units by mouth daily.        Marland Kitchen HYDROcodone-acetaminophen (NORCO) 5-325 MG per tablet Take 1-2 tablets by mouth 2 (two) times daily as needed.  100 tablet  1  . hydrocortisone valerate (WEST-CORT) 0.2 % ointment Apply 1 application topically 2 (two) times daily as needed.       Marland Kitchen MICROZIDE 12.5 MG capsule TAKE 1 CAPSULE ONCE A DAYFOR BLOOD PRESSURE.   30 each  5  . sulindac (CLINORIL) 150 MG tablet Take 1 tablet (150 mg total) by mouth 2 (two) times daily.  60 tablet  5  . DISCONTD: amitriptyline (ELAVIL) 25 MG tablet Take 1 tablet (25 mg total) by mouth at bedtime.  60 tablet  5     Allergies  Allergen Reactions  . Gabapentin     REACTION: numb legs  . Lyrica (Pregabalin)     REACTION: side effects     Family History  Problem Relation Age of Onset  . Hypertension Other   . Arthritis Mother   . Hypertension Father      History   Social History  . Marital Status: Married    Spouse Name: N/A    Number of Children: 1  . Years of Education: N/A   Occupational History  . retired    Social History Main Topics  . Smoking status: Former Games developer  . Smokeless tobacco: None   Comment: quit in 1983  . Alcohol Use: No  . Drug Use: No  . Sexually Active: Not Currently   Other Topics Concern  . None   Social History Narrative  . None  REVIEW OF SYSTEMS - PERTINENT POSITIVES ONLY: 12 point review of symptoms negative other than HPI.     EXAM: Filed Vitals:   10/09/10 1015  BP: 150/74  Pulse: 88  Temp: 97.4 F (36.3 C)   Head: Normocephalic and atraumatic.  Eyes: Conjunctivae are normal. Pupils are equal, round, and reactive to light. No scleral icterus.  Neck: Normal range of motion. Neck supple. No tracheal deviation present. No thyromegaly present.  Cardiovascular: Normal rate, regular rhythm, normal heart sounds and intact distal pulses.  Exam reveals no gallop and no friction rub.   No murmur heard. Respiratory: Effort normal and breath sounds normal. No respiratory distress. No wheezes, rales or rhonchi, no chest wall tenderness.  GI: Soft. Bowel sounds are normal. The abdomen is soft and nontender.  There is no rebound and no guarding.  Musculoskeletal: Normal range of motion. Extremities are nontender.  Lymphadenopathy: No cervical, preauricular, postauricular or axillary adenopathy is  present Neurological: Alert and oriented to person, place, and time. Coordination normal.  Skin: Skin is warm and dry. No rash noted. No diaphoresis. No erythema. No pallor.  Psychiatric: Normal mood and affect. Behavior is normal. Judgment and thought content normal.    LABORATORY RESULTS: Labs are reviewed    RADIOLOGY RESULTS: See E-Chart or I-Site for most recent results.  Images and reports are reviewed.   Gallstones Has had additional pain episodes, but is currently asymptomatic.   I advised her that with increasing frequency of attacks, it is unlikely that she will be able to avoid cholecystectomy. Her AST/ALT/ALP were slightly elevated at last check which likely indicates that she is passing stones.   Pt would still like to avoid surgery.  She states that she has a brain aneurysm and she does not want to be put to sleep if possible. She is going to call if she wants to schedule surgery, and if not, I still will see her back in 3 months to assess her symptoms.   I reviewed the indications for going to the ER for acute cholecystitis or choledocholithiasis.  I advised her that the gallbladder can make her very sick and that surgery may be harder if she waits until that point.    The surgical procedure was described to the patient in detail.  The patient was given Agricultural engineer. .  I discussed the incision type and location, the location of the gallbladder, the anatomy of the bile ducts and arteries, and the typical progression of surgery.  I discussed the possibility of converting to an open operation.  I advised of the risks of bleeding, infection, damage to other structures (such as the bile duct, intestine or liver), bile leak, need for other procedures or surgeries, and post op diarrhea/constipation.  We discussed the risk of blood clot.  We discussed the recovery period and post operative restrictions.           Maudry Diego MD Surgical Oncology, General and  Endocrine Surgery [facility]      Visit Diagnoses: 1. Gallstones     Primary Care Physician: Sonda Primes, MD, MD

## 2010-10-09 NOTE — Patient Instructions (Signed)
Call for pain that does not go away after 1-2 doses of pain medication.  Call for increasing frequency of pain attacks.  Be on the lookout for jaundice (yellow skin/eyes), very dark urine, clay colored stools, or fevers/chills.

## 2010-10-29 ENCOUNTER — Emergency Department (HOSPITAL_COMMUNITY)
Admission: EM | Admit: 2010-10-29 | Discharge: 2010-10-29 | Disposition: A | Discharge status: Home / Self Care | Payer: Medicare Other | Attending: Emergency Medicine | Admitting: Emergency Medicine

## 2010-10-29 DIAGNOSIS — I10 Essential (primary) hypertension: Secondary | ICD-10-CM | POA: Insufficient documentation

## 2010-10-29 DIAGNOSIS — E119 Type 2 diabetes mellitus without complications: Secondary | ICD-10-CM | POA: Insufficient documentation

## 2010-10-29 DIAGNOSIS — K8050 Calculus of bile duct without cholangitis or cholecystitis without obstruction: Secondary | ICD-10-CM | POA: Insufficient documentation

## 2010-10-29 DIAGNOSIS — R1011 Right upper quadrant pain: Secondary | ICD-10-CM | POA: Insufficient documentation

## 2010-10-29 DIAGNOSIS — K859 Acute pancreatitis without necrosis or infection, unspecified: Secondary | ICD-10-CM | POA: Insufficient documentation

## 2010-10-29 DIAGNOSIS — R110 Nausea: Secondary | ICD-10-CM | POA: Insufficient documentation

## 2010-10-29 LAB — URINE MICROSCOPIC-ADD ON

## 2010-10-29 LAB — URINALYSIS, ROUTINE W REFLEX MICROSCOPIC
Bilirubin Urine: NEGATIVE
Glucose, UA: NEGATIVE mg/dL
Hgb urine dipstick: NEGATIVE
Ketones, ur: NEGATIVE mg/dL
Nitrite: NEGATIVE
Protein, ur: NEGATIVE mg/dL
Specific Gravity, Urine: 1.021 (ref 1.005–1.030)
Urobilinogen, UA: 1 mg/dL (ref 0.0–1.0)
pH: 5 (ref 5.0–8.0)

## 2010-10-29 LAB — DIFFERENTIAL
Basophils Absolute: 0 10*3/uL (ref 0.0–0.1)
Basophils Relative: 0 % (ref 0–1)
Eosinophils Absolute: 0.1 10*3/uL (ref 0.0–0.7)
Eosinophils Relative: 1 % (ref 0–5)
Lymphocytes Relative: 10 % — ABNORMAL LOW (ref 12–46)
Lymphs Abs: 1.4 10*3/uL (ref 0.7–4.0)
Monocytes Absolute: 0.6 10*3/uL (ref 0.1–1.0)
Monocytes Relative: 4 % (ref 3–12)
Neutro Abs: 11.5 10*3/uL — ABNORMAL HIGH (ref 1.7–7.7)
Neutrophils Relative %: 85 % — ABNORMAL HIGH (ref 43–77)

## 2010-10-29 LAB — COMPREHENSIVE METABOLIC PANEL
ALT: 28 U/L (ref 0–35)
AST: 38 U/L — ABNORMAL HIGH (ref 0–37)
Albumin: 3.9 g/dL (ref 3.5–5.2)
Alkaline Phosphatase: 111 U/L (ref 39–117)
BUN: 23 mg/dL (ref 6–23)
CO2: 24 mEq/L (ref 19–32)
Calcium: 10.2 mg/dL (ref 8.4–10.5)
Chloride: 100 mEq/L (ref 96–112)
Creatinine, Ser: 0.74 mg/dL (ref 0.50–1.10)
GFR calc Af Amer: >60 mL/min (ref >60)
GFR calc non Af Amer: >60 mL/min (ref >60)
Glucose, Bld: 269 mg/dL — ABNORMAL HIGH (ref 70–99)
Potassium: 3.7 mEq/L (ref 3.5–5.1)
Sodium: 138 mEq/L (ref 135–145)
Total Bilirubin: 0.7 mg/dL (ref 0.3–1.2)
Total Protein: 7.2 g/dL (ref 6.0–8.3)

## 2010-10-29 LAB — CBC
HCT: 34.9 % — ABNORMAL LOW (ref 36.0–46.0)
Hemoglobin: 12.3 g/dL (ref 12.0–15.0)
MCH: 30.4 pg (ref 26.0–34.0)
MCHC: 35.2 g/dL (ref 30.0–36.0)
MCV: 86.2 fL (ref 78.0–100.0)
Platelets: 189 10*3/uL (ref 150–400)
RBC: 4.05 MIL/uL (ref 3.87–5.11)
RDW: 12.4 % (ref 11.5–15.5)
WBC: 13.5 10*3/uL — ABNORMAL HIGH (ref 4.0–10.5)

## 2010-10-29 LAB — LIPASE, BLOOD: Lipase: 2510 U/L — ABNORMAL HIGH (ref 11–59)

## 2010-10-30 ENCOUNTER — Telehealth (INDEPENDENT_AMBULATORY_CARE_PROVIDER_SITE_OTHER): Payer: Self-pay

## 2010-10-30 ENCOUNTER — Telehealth (INDEPENDENT_AMBULATORY_CARE_PROVIDER_SITE_OTHER): Payer: Self-pay | Admitting: General Surgery

## 2010-10-30 ENCOUNTER — Encounter (INDEPENDENT_AMBULATORY_CARE_PROVIDER_SITE_OTHER): Payer: Self-pay | Admitting: General Surgery

## 2010-10-30 NOTE — Telephone Encounter (Signed)
Patient called, had another gallbladder attack on Saturday 10/28/10 and is now ready to schedule surgery. Patient wants to speak with Dr Donell Beers prior to scheduling surgery. She wanted her to call her. I advised to make an appt, but I saw none available. Please advise.

## 2010-10-30 NOTE — Telephone Encounter (Signed)
AFTERHOURS STAT DICTATION  Patients daughter called Saturday evening at 11:00pm stating that she thought Ms. Antone was having a gallbladder attack.  She stated that gallstones have been documented and the patient had been seen as an outpatient by Dr. Donell Beers.  She described right-sided pain, chest pain, sweating, shortness of breath.  I (Dr. Donell Beers) told her to go to the emergency room immediately to make sure she was not having a heart attack.  I (Dr. Donell Beers) told her this might be a gallbladder attack.  She said that she is going to take her to the emergency room immediately

## 2010-10-31 ENCOUNTER — Ambulatory Visit (INDEPENDENT_AMBULATORY_CARE_PROVIDER_SITE_OTHER): Payer: Medicare Other | Admitting: General Surgery

## 2010-10-31 DIAGNOSIS — K8020 Calculus of gallbladder without cholecystitis without obstruction: Secondary | ICD-10-CM

## 2010-10-31 NOTE — Progress Notes (Signed)
HISTORY: Pt with another severe attack of pain that brought her to the ED.  She had severe pain, nausea, and vomiting.  Vicodin did not help her.  She said by the time the doctors came to see her, she was better, but is now afraid to eat.  The pain did come on after food.  She had questions about surgery and wants to schedule an operation.  She did not have fevers/chills.      EXAM: Head: Normocephalic and atraumatic.  Eyes:  Conjunctivae are normal. Pupils are equal, round, and reactive to light. No scleral icterus.  Neck:  Normal range of motion. Neck supple. No tracheal deviation present. No thyromegaly present.  Resp: No respiratory distress, normal effort. Abd: Soft.  Abdomen is soft, non distended and non tender No masses are palpable.  There is no rebound and no guarding.  Neurological: Alert and oriented to person, place, and time. Coordination normal.  Skin: Skin is warm and dry. No rash noted. No diaphoretic. No erythema. No pallor.  Psychiatric: Normal mood and affect. Normal behavior. Judgment and thought content normal.    ASSESSMENT AND PLAN: Gallstones Pt with another severe attack.   Plan lap chole.  Reviewed risks and surgery again. The surgical procedure was described to the patient in detail.  The patient was given Agricultural engineer. .  I discussed the incision type and location, the location of the gallbladder, the anatomy of the bile ducts and arteries, and the typical progression of surgery.  I discussed the possibility of converting to an open operation.  I advised of the risks of bleeding, infection, damage to other structures (such as the bile duct, intestine or liver), bile leak, need for other procedures or surgeries, and post op diarrhea/constipation.  We discussed the risk of blood clot.  We discussed the recovery period and post operative restrictions.  The patient was advised against taking blood thinners the week before surgery.        Maudry Diego,  MD Surgical Oncology, General & Endocrine Surgery [facility]  Sonda Primes, MD, MD Sonda Primes, MD

## 2010-10-31 NOTE — Patient Instructions (Signed)
Stop sulindac one week before surgery.  IF THESE ARE NOT STOPPED AT THE APPROPRIATE TIME, THIS WILL RESULT IN A DELAY FOR YOUR SURGERY.  DO NOT TAKE THESE MEDICATIONS OR IBUPROFEN/NAPROXEN WITHIN A WEEK BEFORE SURGERY.   Usually we are able to remove the gallbladder with the laparoscopic equipment (minimally invasive).  If the anatomy is unclear or if there is severe infection or scar tissue we may need to make a larger incision to remove the gallbladder safely.  Some patients have evidence of gallstones remaining in their ducts that are not able to cleared surgically and may need ERCP (endoscopy) to remove them in the few days following surgery.     Otherwise, the main risks of surgery are bleeding, infection, damage to other structures, and hernia at the  incision sites.    These complications may lead to additional procedures such as drain placement or endoscopy, and in rare cases may lead to other surgeries.   These are not common risks, but do occur.     Most patients have some constipation in the week after surgery.  You may need over the counter stool softeners or laxatives if you experience difficulty having bowel movements.    Some patients experience diarrhea or experience a need to have a bowel movement shortly after eating.  Please discuss this with me when you come back if that occurs because you may require medication if it is severe.    If the following occur, call our office at [phone]: If you have a fever over 101 or pain that is severe despite narcotics. If you have redness or drainage at the wound. If your stools become clay-colored or you become jaundiced (yellow skin/eyes) If you develop persistent nausea or vomiting.  I will follow you back up in 3-4 weeks.    Please submit any paperwork about time off work/insurance forms to the front desk.

## 2010-10-31 NOTE — Assessment & Plan Note (Signed)
Pt with another severe attack.   Plan lap chole.  Reviewed risks and surgery again. The surgical procedure was described to the patient in detail.  The patient was given Agricultural engineer. .  I discussed the incision type and location, the location of the gallbladder, the anatomy of the bile ducts and arteries, and the typical progression of surgery.  I discussed the possibility of converting to an open operation.  I advised of the risks of bleeding, infection, damage to other structures (such as the bile duct, intestine or liver), bile leak, need for other procedures or surgeries, and post op diarrhea/constipation.  We discussed the risk of blood clot.  We discussed the recovery period and post operative restrictions.  The patient was advised against taking blood thinners the week before surgery.

## 2010-11-03 ENCOUNTER — Encounter (HOSPITAL_COMMUNITY)
Admission: RE | Admit: 2010-11-03 | Discharge: 2010-11-03 | Disposition: A | Discharge status: Home / Self Care | Payer: Medicare Other | Source: Ambulatory Visit | Attending: General Surgery | Admitting: General Surgery

## 2010-11-03 LAB — COMPREHENSIVE METABOLIC PANEL
ALT: 19 U/L (ref 0–35)
AST: 22 U/L (ref 0–37)
Albumin: 3.5 g/dL (ref 3.5–5.2)
Alkaline Phosphatase: 109 U/L (ref 39–117)
BUN: 20 mg/dL (ref 6–23)
CO2: 27 mEq/L (ref 19–32)
Calcium: 10.1 mg/dL (ref 8.4–10.5)
Chloride: 103 mEq/L (ref 96–112)
Creatinine, Ser: 0.75 mg/dL (ref 0.50–1.10)
GFR calc Af Amer: >60 mL/min (ref >60)
GFR calc non Af Amer: >60 mL/min (ref >60)
Glucose, Bld: 151 mg/dL — ABNORMAL HIGH (ref 70–99)
Potassium: 4 mEq/L (ref 3.5–5.1)
Sodium: 141 mEq/L (ref 135–145)
Total Bilirubin: 0.3 mg/dL (ref 0.3–1.2)
Total Protein: 6.6 g/dL (ref 6.0–8.3)

## 2010-11-03 LAB — DIFFERENTIAL
Basophils Absolute: 0 10*3/uL (ref 0.0–0.1)
Basophils Relative: 1 % (ref 0–1)
Eosinophils Absolute: 0.3 10*3/uL (ref 0.0–0.7)
Eosinophils Relative: 3 % (ref 0–5)
Lymphocytes Relative: 27 % (ref 12–46)
Lymphs Abs: 2.2 10*3/uL (ref 0.7–4.0)
Monocytes Absolute: 0.7 10*3/uL (ref 0.1–1.0)
Monocytes Relative: 9 % (ref 3–12)
Neutro Abs: 5.1 10*3/uL (ref 1.7–7.7)
Neutrophils Relative %: 61 % (ref 43–77)

## 2010-11-03 LAB — PROTIME-INR
INR: 1.02 (ref 0.00–1.49)
Prothrombin Time: 13.6 seconds (ref 11.6–15.2)

## 2010-11-03 LAB — CBC
HCT: 35.2 % — ABNORMAL LOW (ref 36.0–46.0)
Hemoglobin: 12 g/dL (ref 12.0–15.0)
MCH: 29.6 pg (ref 26.0–34.0)
MCHC: 34.1 g/dL (ref 30.0–36.0)
MCV: 86.7 fL (ref 78.0–100.0)
Platelets: 193 10*3/uL (ref 150–400)
RBC: 4.06 MIL/uL (ref 3.87–5.11)
RDW: 12.6 % (ref 11.5–15.5)
WBC: 8.3 10*3/uL (ref 4.0–10.5)

## 2010-11-03 LAB — SURGICAL PCR SCREEN
MRSA, PCR: NEGATIVE
Staphylococcus aureus: NEGATIVE

## 2010-11-03 LAB — APTT: aPTT: 25 seconds (ref 24–37)

## 2010-11-03 NOTE — Progress Notes (Signed)
Quick Note:  Labs ok for surgery ______ 

## 2010-11-07 ENCOUNTER — Ambulatory Visit (HOSPITAL_COMMUNITY)
Admission: RE | Admit: 2010-11-07 | Discharge: 2010-11-07 | Disposition: A | Discharge status: Home / Self Care | Payer: Medicare Other | Source: Ambulatory Visit | Attending: General Surgery | Admitting: General Surgery

## 2010-11-07 ENCOUNTER — Other Ambulatory Visit (INDEPENDENT_AMBULATORY_CARE_PROVIDER_SITE_OTHER): Payer: Self-pay | Admitting: General Surgery

## 2010-11-07 DIAGNOSIS — Z01812 Encounter for preprocedural laboratory examination: Secondary | ICD-10-CM | POA: Insufficient documentation

## 2010-11-07 DIAGNOSIS — K8020 Calculus of gallbladder without cholecystitis without obstruction: Secondary | ICD-10-CM | POA: Insufficient documentation

## 2010-11-07 DIAGNOSIS — K8010 Calculus of gallbladder with chronic cholecystitis without obstruction: Secondary | ICD-10-CM

## 2010-11-07 DIAGNOSIS — I10 Essential (primary) hypertension: Secondary | ICD-10-CM | POA: Insufficient documentation

## 2010-11-07 DIAGNOSIS — G609 Hereditary and idiopathic neuropathy, unspecified: Secondary | ICD-10-CM | POA: Insufficient documentation

## 2010-11-07 DIAGNOSIS — E119 Type 2 diabetes mellitus without complications: Secondary | ICD-10-CM | POA: Insufficient documentation

## 2010-11-07 LAB — GLUCOSE, CAPILLARY
Glucose-Capillary: 143 mg/dL — ABNORMAL HIGH (ref 70–99)
Glucose-Capillary: 155 mg/dL — ABNORMAL HIGH (ref 70–99)

## 2010-11-28 NOTE — Op Note (Signed)
NAMENESSIE, Kristina Wilkinson                ACCOUNT NO.:  [id number]  MEDICAL RECORD NO.:  [id number]  LOCATION:  [facility]                         FACILITY:  [facility]  PHYSICIAN:  Almond Lint, MD       DATE OF BIRTH:  [birth year]  DATE OF PROCEDURE:  11/07/2010 DATE OF DISCHARGE:                              OPERATIVE REPORT   PREOPERATIVE DIAGNOSIS:  Biliary colic.  POSTOPERATIVE DIAGNOSIS:  Biliary colic.  PROCEDURE PERFORMED:  Laparoscopic cholecystectomy.  SURGEON:  Almond Lint, MD  ANESTHESIA:  General and local.  FINDINGS:  Gallstone and mild inflammation.  SPECIMEN:  Gallbladder to pathology.  ESTIMATED BLOOD LOSS:  Minimal.  COMPLICATIONS:  None known.  PROCEDURE:  Kristina Wilkinson was identified in the holding area and taken to operating where she was placed supine on the operating room table. General anesthesia was induced.  Her abdomen was prepped and draped in sterile fashion.  Time-out was performed according to surgical safety check list.  When all was correct, we continued.  The infraumbilical skin was anesthetized with local anesthetic and a curvilinear transverse incision was made with a #11 blade.  The subcutaneous tissues were spread with a Tresa Endo.  The umbilical fascia was elevated with 2 Kocher clamps and incised in the midline.  A 0 Vicryl pursestring suture was passed around the fascial incision.  The Hasson trocar was introduced into the abdomen and held in place to the abdominal wall with the tails of suture.  Pneumoperitoneum was achieved to a pressure of 15 mmHg.  The patient was placed into reverse Trendelenburg position and rotated to the left.  An 11-mm trocar was placed in the epigastrium under direct visualization and two 5-mm trocars were placed in the right upper quadrant after administration of local under direct visualization.  The gallbladder fundus was elevated toward the head and the infundibulum was retracted laterally.  The peritoneum was opened with  the hook cautery. The cystic artery was lying on top with no flow directly adjacent and this was skeletonized and doubly clipped on the patient's side and singly clipped on the specimen side.  It was then divided.  The cystic duct was then identified as the gallbladder tapered down.  This was then triply clipped on the patient's side and doubly clipped on the specimen side.  It was then divided.  There was another small vein headed upward into the gallbladder and this was clipped as well.  The hook cautery was then used to take the gallbladder off the gallbladder fossa.  The gallbladder was then retrieved from the umbilical incision with an EndoCatch bag.  Pneumoperitoneum was retrieved.  The right upper quadrant was irrigated. There was a few small areas of oozing on the surface of liver that were coagulated.  The right upper quadrant was again irrigated somewhat and a 4-quadrant inspection was performed.  All the irrigant that was visible was removed and there was no other evidence of gross pathology in the abdomen.  The epigastric and 5-mm right upper quadrant trocars were removed under direct visualization without evidence of bleeding from the abdominal wall.  The pneumoperitoneum was allowed to evacuate through the Hasson. The pursestring suture was tied  down the umbilicus and there was no residual palpable fascial defect.  The skin of all incisions was closed with 4-0 Monocryl in a subcuticular fashion.  The wounds were then cleaned, dried and dressed with Dermabond.  The patient was awakened from anesthesia and taken to PACU in stable condition.  Needle, sponge, and instrument counts were correct     Almond Lint, MD     FB/MEDQ  D:  11/07/2010  T:  11/07/2010  Job:  [id number]  Electronically Signed by Almond Lint MD on 11/28/2010 12:11:13 PM

## 2010-11-29 ENCOUNTER — Encounter (INDEPENDENT_AMBULATORY_CARE_PROVIDER_SITE_OTHER): Payer: Self-pay | Admitting: General Surgery

## 2010-12-01 ENCOUNTER — Ambulatory Visit (INDEPENDENT_AMBULATORY_CARE_PROVIDER_SITE_OTHER): Payer: Medicare Other | Admitting: General Surgery

## 2010-12-01 DIAGNOSIS — K8010 Calculus of gallbladder with chronic cholecystitis without obstruction: Secondary | ICD-10-CM | POA: Insufficient documentation

## 2010-12-01 NOTE — Progress Notes (Signed)
HISTORY: Patient doing well 3 weeks status post laparoscopic cholecystectomy. Her symptoms of nausea and abdominal pain have resolved. She is not experiencing any constipation or diarrhea. She is no longer taking any narcotics or other analgesics. She denies fevers and chills. She has returned to most of her normal activities other than heavy lifting.  PERTINENT REVIEW OF SYSTEMS: Otherwise negative.   EXAM: Head: Normocephalic and atraumatic.  Eyes:  Conjunctivae are normal. Pupils are equal, round, and reactive to light. No scleral icterus.  Neck:  Normal range of motion. Neck supple. No tracheal deviation present. No thyromegaly present.  Resp: No respiratory distress, normal effort. Abd:  Abdomen is soft, non distended and non tender. No masses are palpable.  There is no rebound and no guarding.  Neurological: Alert and oriented to person, place, and time. Coordination normal.  Skin: Skin is warm and dry. No rash noted. No diaphoretic. No erythema. No pallor.  Psychiatric: Normal mood and affect. Normal behavior. Judgment and thought content normal.   Pathology reviewed and demonstrates: Chronic cholecystitis and cholelithiasis.  ASSESSMENT AND PLAN:   Cholecystitis with cholelithiasis, s/p lap chole 11/10/2010 Pt doing well. Symptoms resolved. No apparent post op complications.  Resume normal activity and diet.  Follow up as needed.         Maudry Diego, MD Surgical Oncology, General & Endocrine Surgery [facility]  Sonda Primes, MD, MD Sonda Primes, MD

## 2010-12-01 NOTE — Patient Instructions (Signed)
May resume normal activities and diet.

## 2010-12-01 NOTE — Assessment & Plan Note (Signed)
Pt doing well. Symptoms resolved. No apparent post op complications.  Resume normal activity and diet.  Follow up as needed.

## 2010-12-11 ENCOUNTER — Other Ambulatory Visit: Payer: Self-pay | Admitting: Internal Medicine

## 2011-01-16 ENCOUNTER — Other Ambulatory Visit (INDEPENDENT_AMBULATORY_CARE_PROVIDER_SITE_OTHER): Payer: Medicare Other

## 2011-01-16 DIAGNOSIS — Z0000 Encounter for general adult medical examination without abnormal findings: Secondary | ICD-10-CM

## 2011-01-16 DIAGNOSIS — E119 Type 2 diabetes mellitus without complications: Secondary | ICD-10-CM

## 2011-01-16 LAB — URINALYSIS
Bilirubin Urine: NEGATIVE
Hgb urine dipstick: NEGATIVE
Ketones, ur: NEGATIVE
Leukocytes, UA: NEGATIVE
Nitrite: NEGATIVE
Specific Gravity, Urine: 1.02 (ref 1.000–1.030)
Total Protein, Urine: NEGATIVE
Urine Glucose: NEGATIVE
Urobilinogen, UA: 0.2 (ref 0.0–1.0)
pH: 6.5 (ref 5.0–8.0)

## 2011-01-16 LAB — COMPREHENSIVE METABOLIC PANEL
ALT: 20 U/L (ref 0–35)
AST: 20 U/L (ref 0–37)
Albumin: 3.7 g/dL (ref 3.5–5.2)
Alkaline Phosphatase: 114 U/L (ref 39–117)
BUN: 23 mg/dL (ref 6–23)
CO2: 28 mEq/L (ref 19–32)
Calcium: 9.6 mg/dL (ref 8.4–10.5)
Chloride: 107 mEq/L (ref 96–112)
Creatinine, Ser: 0.7 mg/dL (ref 0.4–1.2)
GFR: 81.17 mL/min (ref >60.00)
Glucose, Bld: 130 mg/dL — ABNORMAL HIGH (ref 70–99)
Potassium: 4.3 mEq/L (ref 3.5–5.1)
Sodium: 141 mEq/L (ref 135–145)
Total Bilirubin: 0.4 mg/dL (ref 0.3–1.2)
Total Protein: 7 g/dL (ref 6.0–8.3)

## 2011-01-16 LAB — CBC WITH DIFFERENTIAL/PLATELET
Basophils Absolute: 0 10*3/uL (ref 0.0–0.1)
Basophils Relative: 0.5 % (ref 0.0–3.0)
Eosinophils Absolute: 0.4 10*3/uL (ref 0.0–0.7)
Eosinophils Relative: 5.6 % — ABNORMAL HIGH (ref 0.0–5.0)
HCT: 36.6 % (ref 36.0–46.0)
Hemoglobin: 12.5 g/dL (ref 12.0–15.0)
Lymphocytes Relative: 22.9 % (ref 12.0–46.0)
Lymphs Abs: 1.5 10*3/uL (ref 0.7–4.0)
MCHC: 34.2 g/dL (ref 30.0–36.0)
MCV: 88.1 fl (ref 78.0–100.0)
Monocytes Absolute: 0.6 10*3/uL (ref 0.1–1.0)
Monocytes Relative: 9.3 % (ref 3.0–12.0)
Neutro Abs: 4.1 10*3/uL (ref 1.4–7.7)
Neutrophils Relative %: 61.7 % (ref 43.0–77.0)
Platelets: 177 10*3/uL (ref 150.0–400.0)
RBC: 4.15 Mil/uL (ref 3.87–5.11)
RDW: 13 % (ref 11.5–14.6)
WBC: 6.6 10*3/uL (ref 4.5–10.5)

## 2011-01-16 LAB — LIPID PANEL
Cholesterol: 189 mg/dL (ref 0–200)
HDL: 53.2 mg/dL (ref >39.00)
LDL Cholesterol: 114 mg/dL — ABNORMAL HIGH (ref 0–99)
Total CHOL/HDL Ratio: 4
Triglycerides: 107 mg/dL (ref 0.0–149.0)
VLDL: 21.4 mg/dL (ref 0.0–40.0)

## 2011-01-16 LAB — HEMOGLOBIN A1C: Hgb A1c MFr Bld: 6.5 % (ref 4.6–6.5)

## 2011-01-16 LAB — TSH: TSH: 1.32 u[IU]/mL (ref 0.35–5.50)

## 2011-01-23 ENCOUNTER — Ambulatory Visit (INDEPENDENT_AMBULATORY_CARE_PROVIDER_SITE_OTHER): Payer: Medicare Other | Admitting: Internal Medicine

## 2011-01-23 ENCOUNTER — Encounter: Payer: Self-pay | Admitting: Internal Medicine

## 2011-01-23 VITALS — BP 150/90 | HR 84 | Temp 97.0°F | Resp 16 | Wt 166.0 lb

## 2011-01-23 DIAGNOSIS — I10 Essential (primary) hypertension: Secondary | ICD-10-CM

## 2011-01-23 DIAGNOSIS — K8010 Calculus of gallbladder with chronic cholecystitis without obstruction: Secondary | ICD-10-CM

## 2011-01-23 DIAGNOSIS — M545 Low back pain: Secondary | ICD-10-CM

## 2011-01-23 DIAGNOSIS — E119 Type 2 diabetes mellitus without complications: Secondary | ICD-10-CM

## 2011-01-23 DIAGNOSIS — Z0000 Encounter for general adult medical examination without abnormal findings: Secondary | ICD-10-CM

## 2011-01-23 MED ORDER — AMITRIPTYLINE HCL 25 MG PO TABS: 50.0000 mg | ORAL_TABLET | Freq: Every day | ORAL | Status: DC

## 2011-01-23 MED ORDER — SULINDAC 150 MG PO TABS: 150.0000 mg | ORAL_TABLET | Freq: Two times a day (BID) | ORAL | Status: DC | PRN

## 2011-01-23 MED ORDER — VITAMIN D 1000 UNITS PO TABS: 2000.0000 [IU] | ORAL_TABLET | Freq: Every day | ORAL | Status: DC

## 2011-01-23 MED ORDER — HYDROCODONE-ACETAMINOPHEN 5-325 MG PO TABS: 1.0000 | ORAL_TABLET | Freq: Two times a day (BID) | ORAL | Status: DC | PRN

## 2011-01-23 MED ORDER — TRIAMTERENE-HCTZ 37.5-25 MG PO TABS: 1.0000 | ORAL_TABLET | Freq: Every day | ORAL | Status: DC

## 2011-01-23 MED ORDER — HYDROCHLOROTHIAZIDE 12.5 MG PO CAPS: 12.5000 mg | ORAL_CAPSULE | Freq: Every day | ORAL | Status: DC

## 2011-01-23 NOTE — Assessment & Plan Note (Signed)
Doing well post-op 

## 2011-01-23 NOTE — Assessment & Plan Note (Signed)
Continue with current prescription therapy as reflected on the Med list.  

## 2011-01-23 NOTE — Progress Notes (Signed)
  Subjective:    Patient ID: Kristina Wilkinson, female    DOB: 1927/12/01, 77 y.o.   MRN: [id number]  HPI The patient is here for a wellness exam. The patient has been doing well overall without major physical or psychological issues going on lately. The patient needs to address  chronic hypertension that has been well controlled with medicines; to address chronic  hyperlipidemia controlled with medicines as well; and to address type 2 chronic diabetes, controlled with medical treatment and diet. C/o OA.   Review of Systems  Constitutional: Negative for chills, activity change, appetite change, fatigue and unexpected weight change.  HENT: Negative for congestion, mouth sores and sinus pressure.   Eyes: Negative for visual disturbance.  Respiratory: Negative for cough and chest tightness.   Gastrointestinal: Negative for nausea and abdominal pain.  Genitourinary: Negative for frequency, difficulty urinating and vaginal pain.  Musculoskeletal: Negative for back pain and gait problem.  Skin: Negative for pallor and rash.  Neurological: Negative for dizziness, tremors, weakness, numbness and headaches.  Psychiatric/Behavioral: Negative for confusion and sleep disturbance.       Objective:   Physical Exam  Constitutional: She appears well-developed and well-nourished. No distress.  HENT:  Head: Normocephalic.  Right Ear: External ear normal.  Left Ear: External ear normal.  Nose: Nose normal.  Mouth/Throat: Oropharynx is clear and moist.  Eyes: Conjunctivae are normal. Pupils are equal, round, and reactive to light. Right eye exhibits no discharge. Left eye exhibits no discharge.  Neck: Normal range of motion. Neck supple. No JVD present. No tracheal deviation present. No thyromegaly present.  Cardiovascular: Normal rate, regular rhythm and normal heart sounds.   Pulmonary/Chest: No stridor. No respiratory distress. She has no wheezes.  Abdominal: Soft. Bowel sounds are normal. She exhibits  no distension and no mass. There is no tenderness. There is no rebound and no guarding.  Musculoskeletal: She exhibits no edema and no tenderness.  Lymphadenopathy:    She has no cervical adenopathy.  Neurological: She displays normal reflexes. No cranial nerve deficit. She exhibits normal muscle tone. Coordination normal.  Skin: No rash noted. No erythema.  Psychiatric: She has a normal mood and affect. Her behavior is normal. Judgment and thought content normal.   Lab Results  Component Value Date   WBC 6.6 01/16/2011   HGB 12.5 01/16/2011   HCT 36.6 01/16/2011   PLT 177.0 01/16/2011   GLUCOSE 130* 01/16/2011   CHOL 189 01/16/2011   TRIG 107.0 01/16/2011   HDL 53.20 01/16/2011   LDLDIRECT 133.5 01/28/2007   LDLCALC 114* 01/16/2011   ALT 20 01/16/2011   AST 20 01/16/2011   NA 141 01/16/2011   K 4.3 01/16/2011   CL 107 01/16/2011   CREATININE 0.7 01/16/2011   BUN 23 01/16/2011   CO2 28 01/16/2011   TSH 1.32 01/16/2011   INR 1.02 11/03/2010   HGBA1C 6.5 01/16/2011          Assessment & Plan:

## 2011-01-23 NOTE — Assessment & Plan Note (Signed)
The patient is here for annual Medicare wellness examination and management of other chronic and acute problems.   The risk factors are reflected in the social history.  The roster of all physicians providing medical care to patient - is listed in the Snapshot section of the chart.  Activities of daily living:  The patient is 100% inedpendent in all ADLs: dressing, toileting, feeding as well as independent mobility  Home safety : The patient has smoke detectors in the home. They wear seatbelts.No firearms at home ( firearms are present in the home, kept in a safe fashion). There is no violence in the home.   There is no risks for hepatitis, STDs or HIV. There is no   history of blood transfusion. They have no travel history to infectious disease endemic areas of the world.  The patient has (has not) seen their dentist in the last six month. They have (not) seen their eye doctor in the last year. They deny (admit to) any hearing difficulty and have not had audiologic testing in the last year.  They do not  have excessive sun exposure. Discussed the need for sun protection: hats, long sleeves and use of sunscreen if there is significant sun exposure.   Diet: the importance of a healthy diet is discussed. They do have a healthy (unhealthy-high fat/fast food) diet.  The patient has a regular exercise program: yard work  ___7__per week.  The benefits of regular aerobic exercise were discussed.  Depression screen: there are no signs or vegative symptoms of depression- irritability, change in appetite, anhedonia, sadness/tearfullness.  Cognitive assessment: the patient manages all their financial and personal affairs and is actively engaged. They could relate day,date,year and events; recalled 3/3 objects at 3 minutes; performed clock-face test normally.  The following portions of the patient's history were reviewed and updated as appropriate: allergies, current medications, past family history, past  medical history,  past surgical history, past social history  and problem list.  Vision, hearing, body mass index were assessed and reviewed.   During the course of the visit the patient was educated and counseled about appropriate screening and preventive services including : fall prevention , diabetes screening, nutrition counseling, colorectal cancer screening, and recommended immunizations.

## 2011-03-30 ENCOUNTER — Other Ambulatory Visit: Payer: Self-pay | Admitting: Orthopaedic Surgery

## 2011-03-30 DIAGNOSIS — M545 Low back pain: Secondary | ICD-10-CM

## 2011-04-03 ENCOUNTER — Ambulatory Visit
Admission: RE | Admit: 2011-04-03 | Discharge: 2011-04-03 | Disposition: A | Discharge status: Home / Self Care | Payer: Medicare Other | Source: Ambulatory Visit | Attending: Orthopaedic Surgery | Admitting: Orthopaedic Surgery

## 2011-04-03 DIAGNOSIS — R209 Unspecified disturbances of skin sensation: Secondary | ICD-10-CM | POA: Diagnosis not present

## 2011-04-03 DIAGNOSIS — M545 Low back pain: Secondary | ICD-10-CM

## 2011-04-03 DIAGNOSIS — M4310 Spondylolisthesis, site unspecified: Secondary | ICD-10-CM | POA: Diagnosis not present

## 2011-04-03 DIAGNOSIS — M5126 Other intervertebral disc displacement, lumbar region: Secondary | ICD-10-CM | POA: Diagnosis not present

## 2011-04-09 DIAGNOSIS — M1710 Unilateral primary osteoarthritis, unspecified knee: Secondary | ICD-10-CM | POA: Diagnosis not present

## 2011-04-13 ENCOUNTER — Encounter: Payer: Self-pay | Admitting: Internal Medicine

## 2011-04-13 ENCOUNTER — Ambulatory Visit (INDEPENDENT_AMBULATORY_CARE_PROVIDER_SITE_OTHER): Payer: Medicare Other | Admitting: Internal Medicine

## 2011-04-13 VITALS — BP 150/80 | HR 80 | Temp 98.1°F | Resp 16 | Wt 173.0 lb

## 2011-04-13 DIAGNOSIS — M545 Low back pain, unspecified: Secondary | ICD-10-CM

## 2011-04-13 DIAGNOSIS — K219 Gastro-esophageal reflux disease without esophagitis: Secondary | ICD-10-CM

## 2011-04-13 DIAGNOSIS — E119 Type 2 diabetes mellitus without complications: Secondary | ICD-10-CM

## 2011-04-13 DIAGNOSIS — M4800 Spinal stenosis, site unspecified: Secondary | ICD-10-CM

## 2011-04-13 MED ORDER — TRAMADOL HCL 50 MG PO TABS: 50.0000 mg | ORAL_TABLET | Freq: Two times a day (BID) | ORAL | Status: AC | PRN

## 2011-04-13 MED ORDER — PREDNISONE 10 MG PO TABS: ORAL_TABLET | ORAL | Status: DC

## 2011-04-13 NOTE — Patient Instructions (Signed)
If on Prednisone - hold Clinoril Stretch back

## 2011-04-13 NOTE — Progress Notes (Signed)
Patient ID: Kristina Wilkinson, female   DOB: [birth year], 78 y.o.   MRN: [id number]  Subjective:    Patient ID: Berniece Andreas, female    DOB: [birth year], 78 y.o.   MRN: [id number]  Back Pain Pertinent negatives include no abdominal pain, headaches, numbness or weakness.  C/o LBP since Sat-Sun irrad to R leg. She saw Dr Cleophas Dunker had xrays/CT - spinal stenosis - she was offered spinal injections  She is better now  Wt Readings from Last 3 Encounters:  04/13/11 173 lb (78.472 kg)  01/23/11 166 lb (75.297 kg)  12/01/10 167 lb 6.4 oz (75.932 kg)   BP Readings from Last 3 Encounters:  04/13/11 150/80  01/23/11 150/90  12/01/10 148/90       Review of Systems  Constitutional: Negative for chills, activity change, appetite change, fatigue and unexpected weight change.  HENT: Negative for congestion, mouth sores and sinus pressure.   Eyes: Negative for visual disturbance.  Respiratory: Negative for cough and chest tightness.   Gastrointestinal: Negative for nausea, abdominal pain, blood in stool and abdominal distention.  Genitourinary: Negative for frequency, difficulty urinating and vaginal pain.  Musculoskeletal: Positive for back pain. Negative for gait problem.  Skin: Negative for pallor and rash.  Neurological: Negative for dizziness, tremors, weakness, numbness and headaches.  Psychiatric/Behavioral: Negative for confusion and sleep disturbance.       Objective:   Physical Exam  Constitutional: She appears well-developed. No distress.       Obese NAD  HENT:  Head: Normocephalic.  Right Ear: External ear normal.  Left Ear: External ear normal.  Nose: Nose normal.  Mouth/Throat: Oropharynx is clear and moist.  Eyes: Conjunctivae are normal. Pupils are equal, round, and reactive to light. Right eye exhibits no discharge. Left eye exhibits no discharge.  Neck: Normal range of motion. Neck supple. No JVD present. No tracheal deviation present. No thyromegaly present.    Cardiovascular: Normal rate, regular rhythm and normal heart sounds.   Pulmonary/Chest: No stridor. No respiratory distress. She has no wheezes.  Abdominal: Soft. Bowel sounds are normal. She exhibits no distension and no mass. There is no tenderness. There is no rebound and no guarding.  Musculoskeletal: She exhibits no edema and no tenderness.  Lymphadenopathy:    She has no cervical adenopathy.  Neurological: She displays normal reflexes. No cranial nerve deficit. She exhibits normal muscle tone. Coordination normal.  Skin: No rash noted. No erythema.  Psychiatric: She has a normal mood and affect. Her behavior is normal. Judgment and thought content normal.   Lab Results  Component Value Date   WBC 6.6 01/16/2011   HGB 12.5 01/16/2011   HCT 36.6 01/16/2011   PLT 177.0 01/16/2011   GLUCOSE 130* 01/16/2011   CHOL 189 01/16/2011   TRIG 107.0 01/16/2011   HDL 53.20 01/16/2011   LDLDIRECT 133.5 01/28/2007   LDLCALC 114* 01/16/2011   ALT 20 01/16/2011   AST 20 01/16/2011   NA 141 01/16/2011   K 4.3 01/16/2011   CL 107 01/16/2011   CREATININE 0.7 01/16/2011   BUN 23 01/16/2011   CO2 28 01/16/2011   TSH 1.32 01/16/2011   INR 1.02 11/03/2010   HGBA1C 6.5 01/16/2011               Assessment & Plan:

## 2011-04-24 NOTE — Assessment & Plan Note (Signed)
Continue with current prescription therapy as reflected on the Med list.  

## 2011-04-24 NOTE — Assessment & Plan Note (Signed)
Diet controlled.  

## 2011-04-24 NOTE — Assessment & Plan Note (Signed)
Prednisone 10 mg: take 4 tabs a day x 3 days; then 3 tabs a day x 4 days; then 2 tabs a day x 4 days, then 1 tab a day x 6 days, then stop. Take pc.  Potential benefits of a short term steroid  use as well as potential risks  and complications were explained to the patient and were aknowledged.   

## 2011-04-24 NOTE — Assessment & Plan Note (Signed)
Rx as above 

## 2011-07-17 ENCOUNTER — Other Ambulatory Visit (INDEPENDENT_AMBULATORY_CARE_PROVIDER_SITE_OTHER): Payer: Medicare Other

## 2011-07-17 DIAGNOSIS — E119 Type 2 diabetes mellitus without complications: Secondary | ICD-10-CM | POA: Diagnosis not present

## 2011-07-17 DIAGNOSIS — I10 Essential (primary) hypertension: Secondary | ICD-10-CM | POA: Diagnosis not present

## 2011-07-17 LAB — BASIC METABOLIC PANEL
BUN: 28 mg/dL — ABNORMAL HIGH (ref 6–23)
CO2: 26 mEq/L (ref 19–32)
Calcium: 9.7 mg/dL (ref 8.4–10.5)
Chloride: 107 mEq/L (ref 96–112)
Creatinine, Ser: 0.9 mg/dL (ref 0.4–1.2)
GFR: 60.56 mL/min (ref >60.00)
Glucose, Bld: 131 mg/dL — ABNORMAL HIGH (ref 70–99)
Potassium: 4.4 mEq/L (ref 3.5–5.1)
Sodium: 141 mEq/L (ref 135–145)

## 2011-07-17 LAB — HEMOGLOBIN A1C: Hgb A1c MFr Bld: 6.4 % (ref 4.6–6.5)

## 2011-07-24 ENCOUNTER — Encounter: Payer: Self-pay | Admitting: Internal Medicine

## 2011-07-24 ENCOUNTER — Ambulatory Visit (INDEPENDENT_AMBULATORY_CARE_PROVIDER_SITE_OTHER): Payer: Medicare Other | Admitting: Internal Medicine

## 2011-07-24 VITALS — BP 170/92 | HR 92 | Temp 98.2°F | Resp 16 | Wt 172.0 lb

## 2011-07-24 DIAGNOSIS — I10 Essential (primary) hypertension: Secondary | ICD-10-CM | POA: Diagnosis not present

## 2011-07-24 DIAGNOSIS — G4700 Insomnia, unspecified: Secondary | ICD-10-CM | POA: Diagnosis not present

## 2011-07-24 DIAGNOSIS — M25569 Pain in unspecified knee: Secondary | ICD-10-CM

## 2011-07-24 MED ORDER — METHYLPREDNISOLONE ACETATE 80 MG/ML IJ SUSP: 40.0000 mg | Freq: Once | INTRAMUSCULAR | Status: DC

## 2011-07-24 MED ORDER — HYDROCODONE-ACETAMINOPHEN 5-325 MG PO TABS: 1.0000 | ORAL_TABLET | Freq: Two times a day (BID) | ORAL | Status: AC | PRN

## 2011-07-24 NOTE — Assessment & Plan Note (Signed)
Better now 

## 2011-07-24 NOTE — Patient Instructions (Signed)
Postprocedure instructions :    A Band-Aid should be left on for 12 hours. Injection therapy is not a cure itself. It is used in conjunction with other modalities. You can use nonsteroidal anti-inflammatories like ibuprofen , hot and cold compresses. Rest is recommended in the next 24 hours. You need to report immediately  if fever, chills or any signs of infection develop. 

## 2011-07-24 NOTE — Assessment & Plan Note (Signed)
B anserina pain 6/13 - will inject

## 2011-07-24 NOTE — Assessment & Plan Note (Signed)
Nl BP at home  Chronic Continue with current prescription therapy as reflected on the Med list.

## 2011-07-24 NOTE — Progress Notes (Signed)
Subjective:    Patient ID: Kristina Wilkinson, female    DOB: 1928-01-21, 78 y.o.   MRN: [id number]  HPIC/o LBP  X long time irrad to R leg. She saw Dr Cleophas Dunker had xrays/CT - spinal stenosis - she was offered spinal injections. C/o R knee pain x weeks; no injury F/u HTN, insomnia BP is nl at home  She is better now  Wt Readings from Last 3 Encounters:  07/24/11 172 lb (78.019 kg)  04/13/11 173 lb (78.472 kg)  01/23/11 166 lb (75.297 kg)   BP Readings from Last 3 Encounters:  07/24/11 170/92  04/13/11 150/80  01/23/11 150/90       Review of Systems  Constitutional: Negative for chills, activity change, appetite change, fatigue and unexpected weight change.  HENT: Negative for congestion, mouth sores and sinus pressure.   Eyes: Negative for visual disturbance.  Respiratory: Negative for cough and chest tightness.   Gastrointestinal: Negative for nausea, blood in stool and abdominal distention.  Genitourinary: Negative for frequency, difficulty urinating and vaginal pain.  Musculoskeletal: Negative for gait problem.  Skin: Negative for pallor and rash.  Neurological: Negative for dizziness and tremors.  Psychiatric/Behavioral: Negative for confusion and sleep disturbance.       Objective:   Physical Exam  Constitutional: She appears well-developed. No distress.       Obese NAD  HENT:  Head: Normocephalic.  Right Ear: External ear normal.  Left Ear: External ear normal.  Nose: Nose normal.  Mouth/Throat: Oropharynx is clear and moist.  Eyes: Conjunctivae are normal. Pupils are equal, round, and reactive to light. Right eye exhibits no discharge. Left eye exhibits no discharge.  Neck: Normal range of motion. Neck supple. No JVD present. No tracheal deviation present. No thyromegaly present.  Cardiovascular: Normal rate, regular rhythm and normal heart sounds.   Pulmonary/Chest: No stridor. No respiratory distress. She has no wheezes.  Abdominal: Soft. Bowel sounds are  normal. She exhibits no distension and no mass. There is no tenderness. There is no rebound and no guarding.  Musculoskeletal: She exhibits no edema and no tenderness.  Lymphadenopathy:    She has no cervical adenopathy.  Neurological: She displays normal reflexes. No cranial nerve deficit. She exhibits normal muscle tone. Coordination normal.  Skin: No rash noted. No erythema.  Psychiatric: She has a normal mood and affect. Her behavior is normal. Judgment and thought content normal.  R B anserina is tender Lab Results  Component Value Date   WBC 6.6 01/16/2011   HGB 12.5 01/16/2011   HCT 36.6 01/16/2011   PLT 177.0 01/16/2011   GLUCOSE 131* 07/17/2011   CHOL 189 01/16/2011   TRIG 107.0 01/16/2011   HDL 53.20 01/16/2011   LDLDIRECT 133.5 01/28/2007   LDLCALC 114* 01/16/2011   ALT 20 01/16/2011   AST 20 01/16/2011   NA 141 07/17/2011   K 4.4 07/17/2011   CL 107 07/17/2011   CREATININE 0.9 07/17/2011   BUN 28* 07/17/2011   CO2 26 07/17/2011   TSH 1.32 01/16/2011   INR 1.02 11/03/2010   HGBA1C 6.4 07/17/2011           Procedure Note :    Procedure Note :    Procedure :Joint Injection,  Knee bursa ancerina R   Indication: Bursitis with refractory  chronic pain.   Risks including unsuccessful procedure , bleeding, infection, bruising, skin atrophy and others were explained to the patient in detail as well as the benefits. Informed consent was obtained and signed.  Tthe patient was placed in a comfortable position. Skin was prepped with Betadine and alcohol  and anesthetized with a cooling spray. Then, a 3 cc syringe with a 1.5 inch long 25-gauge needle was used for a bursa injection in a fan-like fasion with 3 mL of 2% lidocaine and 40 mg of Depo-Medrol .  Band-Aid was applied.   Tolerated well. Complications: None. Good pain relief following the procedure.   Postprocedure instructions :    A Band-Aid should be left on for 12 hours. Injection therapy is not a cure itself. It is used in  conjunction with other modalities. You can use nonsteroidal anti-inflammatories like ibuprofen , hot and cold compresses. Rest is recommended in the next 24 hours. You need to report immediately  if fever, chills or any signs of infection develop.       Assessment & Plan:

## 2011-09-10 DIAGNOSIS — Z961 Presence of intraocular lens: Secondary | ICD-10-CM | POA: Diagnosis not present

## 2011-09-25 ENCOUNTER — Telehealth: Payer: Self-pay | Admitting: *Deleted

## 2011-09-25 ENCOUNTER — Telehealth: Payer: Self-pay

## 2011-09-25 DIAGNOSIS — I10 Essential (primary) hypertension: Secondary | ICD-10-CM

## 2011-09-25 DIAGNOSIS — E119 Type 2 diabetes mellitus without complications: Secondary | ICD-10-CM

## 2011-09-25 NOTE — Telephone Encounter (Signed)
Message copied by Deatra James on Tue Sep 25, 2011  3:34 PM ------      Message from: Newell Coral      Created: Mon Sep 24, 2011 11:29 AM       The pt called and scheduled her 6 month follow up.  She states she gets blood work with this apt.             Thanks!

## 2011-09-25 NOTE — Telephone Encounter (Signed)
A1c, BMET, lipids, LFT Thx

## 2011-09-25 NOTE — Telephone Encounter (Signed)
Please advise on what lab orders Thanks

## 2011-09-25 NOTE — Telephone Encounter (Signed)
Message copied by Sandi Mealy on Tue Sep 25, 2011 10:34 AM ------      Message from: Newell Coral      Created: Mon Sep 24, 2011 11:29 AM       The pt called and scheduled her 6 month follow up.  She states she gets blood work with this apt.             Thanks!

## 2011-10-01 ENCOUNTER — Telehealth: Payer: Self-pay | Admitting: *Deleted

## 2011-10-01 DIAGNOSIS — I10 Essential (primary) hypertension: Secondary | ICD-10-CM

## 2011-10-01 DIAGNOSIS — E119 Type 2 diabetes mellitus without complications: Secondary | ICD-10-CM

## 2011-10-01 NOTE — Telephone Encounter (Signed)
Done Tried to call pt to inform- # busy, then no answer.

## 2011-10-01 NOTE — Telephone Encounter (Signed)
Pt called back, she was informed that she does need labs before her next apt.  Thanks!

## 2011-10-01 NOTE — Telephone Encounter (Signed)
Please advise what labs are needed for 5 mo f/u?

## 2011-10-01 NOTE — Telephone Encounter (Signed)
Lipids, CMET, TSH Thx

## 2011-10-01 NOTE — Telephone Encounter (Signed)
Message copied by Merrilyn Puma on Mon Oct 01, 2011  8:47 AM ------      Message from: Newell Coral      Created: Mon Sep 24, 2011 11:29 AM       The pt called and scheduled her 6 month follow up.  She states she gets blood work with this apt.             Thanks!

## 2011-10-02 ENCOUNTER — Other Ambulatory Visit: Payer: Self-pay | Admitting: Dermatology

## 2011-10-02 DIAGNOSIS — C44319 Basal cell carcinoma of skin of other parts of face: Secondary | ICD-10-CM | POA: Diagnosis not present

## 2011-10-03 ENCOUNTER — Ambulatory Visit: Payer: Medicare Other | Admitting: Internal Medicine

## 2012-01-01 DIAGNOSIS — C44319 Basal cell carcinoma of skin of other parts of face: Secondary | ICD-10-CM | POA: Diagnosis not present

## 2012-01-22 ENCOUNTER — Other Ambulatory Visit (INDEPENDENT_AMBULATORY_CARE_PROVIDER_SITE_OTHER): Payer: Medicare Other

## 2012-01-22 ENCOUNTER — Other Ambulatory Visit: Payer: Self-pay | Admitting: Internal Medicine

## 2012-01-22 DIAGNOSIS — I10 Essential (primary) hypertension: Secondary | ICD-10-CM | POA: Diagnosis not present

## 2012-01-22 DIAGNOSIS — E119 Type 2 diabetes mellitus without complications: Secondary | ICD-10-CM

## 2012-01-22 LAB — COMPREHENSIVE METABOLIC PANEL
ALT: 20 U/L (ref 0–35)
AST: 21 U/L (ref 0–37)
Albumin: 3.8 g/dL (ref 3.5–5.2)
Alkaline Phosphatase: 94 U/L (ref 39–117)
BUN: 24 mg/dL — ABNORMAL HIGH (ref 6–23)
CO2: 24 mEq/L (ref 19–32)
Calcium: 9.6 mg/dL (ref 8.4–10.5)
Chloride: 105 mEq/L (ref 96–112)
Creatinine, Ser: 1 mg/dL (ref 0.4–1.2)
GFR: 56.97 mL/min — ABNORMAL LOW (ref >60.00)
Glucose, Bld: 127 mg/dL — ABNORMAL HIGH (ref 70–99)
Potassium: 3.7 mEq/L (ref 3.5–5.1)
Sodium: 138 mEq/L (ref 135–145)
Total Bilirubin: 0.3 mg/dL (ref 0.3–1.2)
Total Protein: 7.1 g/dL (ref 6.0–8.3)

## 2012-01-22 LAB — HEPATIC FUNCTION PANEL
ALT: 20 U/L (ref 0–35)
AST: 21 U/L (ref 0–37)
Albumin: 3.8 g/dL (ref 3.5–5.2)
Alkaline Phosphatase: 94 U/L (ref 39–117)
Bilirubin, Direct: 0 mg/dL (ref 0.0–0.3)
Total Bilirubin: 0.3 mg/dL (ref 0.3–1.2)
Total Protein: 7.1 g/dL (ref 6.0–8.3)

## 2012-01-22 LAB — LIPID PANEL
Cholesterol: 196 mg/dL (ref 0–200)
HDL: 50.3 mg/dL (ref >39.00)
LDL Cholesterol: 121 mg/dL — ABNORMAL HIGH (ref 0–99)
Total CHOL/HDL Ratio: 4
Triglycerides: 125 mg/dL (ref 0.0–149.0)
VLDL: 25 mg/dL (ref 0.0–40.0)

## 2012-01-22 LAB — HEMOGLOBIN A1C: Hgb A1c MFr Bld: 6.9 % — ABNORMAL HIGH (ref 4.6–6.5)

## 2012-01-22 LAB — TSH: TSH: 0.95 u[IU]/mL (ref 0.35–5.50)

## 2012-01-29 ENCOUNTER — Ambulatory Visit (INDEPENDENT_AMBULATORY_CARE_PROVIDER_SITE_OTHER): Payer: Medicare Other | Admitting: Internal Medicine

## 2012-01-29 ENCOUNTER — Encounter: Payer: Self-pay | Admitting: Internal Medicine

## 2012-01-29 VITALS — BP 140/86 | HR 76 | Temp 98.0°F | Resp 16 | Wt 179.0 lb

## 2012-01-29 DIAGNOSIS — E119 Type 2 diabetes mellitus without complications: Secondary | ICD-10-CM

## 2012-01-29 DIAGNOSIS — R739 Hyperglycemia, unspecified: Secondary | ICD-10-CM

## 2012-01-29 DIAGNOSIS — E559 Vitamin D deficiency, unspecified: Secondary | ICD-10-CM | POA: Diagnosis not present

## 2012-01-29 DIAGNOSIS — M4800 Spinal stenosis, site unspecified: Secondary | ICD-10-CM | POA: Diagnosis not present

## 2012-01-29 DIAGNOSIS — G609 Hereditary and idiopathic neuropathy, unspecified: Secondary | ICD-10-CM | POA: Diagnosis not present

## 2012-01-29 DIAGNOSIS — R7309 Other abnormal glucose: Secondary | ICD-10-CM

## 2012-01-29 NOTE — Assessment & Plan Note (Signed)
Chronic, diet controlled Declined Metformin 

## 2012-01-29 NOTE — Progress Notes (Signed)
   Subjective:    Patient ID: Kristina Wilkinson, female    DOB: [birth year], 78 y.o.   MRN: [id number]  HPI  C/o LBP  X long time irrad to R leg. She saw Dr Cleophas Dunker had xrays/CT - spinal stenosis - she was offered spinal injections. C/o hands and feet tingling x months. She started building a new house. F/u HTN, insomnia BP is nl at home    Wt Readings from Last 3 Encounters:  01/29/12 179 lb (81.194 kg)  07/24/11 172 lb (78.019 kg)  04/13/11 173 lb (78.472 kg)   BP Readings from Last 3 Encounters:  01/29/12 140/86  07/24/11 170/92  04/13/11 150/80       Review of Systems  Constitutional: Negative for chills, activity change, appetite change, fatigue and unexpected weight change.  HENT: Negative for congestion, mouth sores and sinus pressure.   Eyes: Negative for visual disturbance.  Respiratory: Negative for cough and chest tightness.   Gastrointestinal: Negative for nausea, blood in stool and abdominal distention.  Genitourinary: Negative for frequency, difficulty urinating and vaginal pain.  Musculoskeletal: Negative for gait problem.  Skin: Negative for pallor and rash.  Neurological: Negative for dizziness and tremors.  Psychiatric/Behavioral: Negative for confusion and sleep disturbance.       Objective:   Physical Exam  Constitutional: She appears well-developed. No distress.       Obese NAD  HENT:  Head: Normocephalic.  Right Ear: External ear normal.  Left Ear: External ear normal.  Nose: Nose normal.  Mouth/Throat: Oropharynx is clear and moist.  Eyes: Conjunctivae normal are normal. Pupils are equal, round, and reactive to light. Right eye exhibits no discharge. Left eye exhibits no discharge.  Neck: Normal range of motion. Neck supple. No JVD present. No tracheal deviation present. No thyromegaly present.  Cardiovascular: Normal rate, regular rhythm and normal heart sounds.   Pulmonary/Chest: No stridor. No respiratory distress. She has no wheezes.   Abdominal: Soft. Bowel sounds are normal. She exhibits no distension and no mass. There is no tenderness. There is no rebound and no guarding.  Musculoskeletal: She exhibits no edema and no tenderness.  Lymphadenopathy:    She has no cervical adenopathy.  Neurological: She displays normal reflexes. No cranial nerve deficit. She exhibits normal muscle tone. Coordination normal.  Skin: No rash noted. No erythema.  Psychiatric: She has a normal mood and affect. Her behavior is normal. Judgment and thought content normal.  R B anserina is tender  Lab Results  Component Value Date   WBC 6.6 01/16/2011   HGB 12.5 01/16/2011   HCT 36.6 01/16/2011   PLT 177.0 01/16/2011   GLUCOSE 127* 01/22/2012   CHOL 196 01/22/2012   TRIG 125.0 01/22/2012   HDL 50.30 01/22/2012   LDLDIRECT 133.5 01/28/2007   LDLCALC 121* 01/22/2012   ALT 20 01/22/2012   ALT 20 01/22/2012   AST 21 01/22/2012   AST 21 01/22/2012   NA 138 01/22/2012   K 3.7 01/22/2012   CL 105 01/22/2012   CREATININE 1.0 01/22/2012   BUN 24* 01/22/2012   CO2 24 01/22/2012   TSH 0.95 01/22/2012   INR 1.02 11/03/2010   HGBA1C 6.9* 01/22/2012         Assessment & Plan:

## 2012-01-29 NOTE — Assessment & Plan Note (Signed)
Continue with current prescription therapy as reflected on the Med list.  

## 2012-01-29 NOTE — Assessment & Plan Note (Signed)
Declined Rx 

## 2012-01-29 NOTE — Assessment & Plan Note (Signed)
?   intolerant

## 2012-04-14 ENCOUNTER — Telehealth: Payer: Self-pay | Admitting: Family Medicine

## 2012-04-14 ENCOUNTER — Emergency Department (HOSPITAL_COMMUNITY)
Admission: EM | Admit: 2012-04-14 | Discharge: 2012-04-14 | Disposition: A | Discharge status: Home / Self Care | Payer: Medicare Other | Attending: Emergency Medicine | Admitting: Emergency Medicine

## 2012-04-14 ENCOUNTER — Encounter (HOSPITAL_COMMUNITY): Payer: Self-pay | Admitting: Emergency Medicine

## 2012-04-14 DIAGNOSIS — Z79899 Other long term (current) drug therapy: Secondary | ICD-10-CM | POA: Insufficient documentation

## 2012-04-14 DIAGNOSIS — R404 Transient alteration of awareness: Secondary | ICD-10-CM | POA: Diagnosis not present

## 2012-04-14 DIAGNOSIS — Z8709 Personal history of other diseases of the respiratory system: Secondary | ICD-10-CM | POA: Insufficient documentation

## 2012-04-14 DIAGNOSIS — E119 Type 2 diabetes mellitus without complications: Secondary | ICD-10-CM | POA: Insufficient documentation

## 2012-04-14 DIAGNOSIS — I10 Essential (primary) hypertension: Secondary | ICD-10-CM | POA: Insufficient documentation

## 2012-04-14 DIAGNOSIS — R197 Diarrhea, unspecified: Secondary | ICD-10-CM | POA: Diagnosis not present

## 2012-04-14 DIAGNOSIS — Z8739 Personal history of other diseases of the musculoskeletal system and connective tissue: Secondary | ICD-10-CM | POA: Insufficient documentation

## 2012-04-14 DIAGNOSIS — Z8679 Personal history of other diseases of the circulatory system: Secondary | ICD-10-CM | POA: Insufficient documentation

## 2012-04-14 DIAGNOSIS — Z8639 Personal history of other endocrine, nutritional and metabolic disease: Secondary | ICD-10-CM | POA: Insufficient documentation

## 2012-04-14 DIAGNOSIS — G609 Hereditary and idiopathic neuropathy, unspecified: Secondary | ICD-10-CM | POA: Diagnosis not present

## 2012-04-14 DIAGNOSIS — Z862 Personal history of diseases of the blood and blood-forming organs and certain disorders involving the immune mechanism: Secondary | ICD-10-CM | POA: Diagnosis not present

## 2012-04-14 DIAGNOSIS — E860 Dehydration: Secondary | ICD-10-CM | POA: Diagnosis not present

## 2012-04-14 DIAGNOSIS — Z87891 Personal history of nicotine dependence: Secondary | ICD-10-CM | POA: Diagnosis not present

## 2012-04-14 DIAGNOSIS — K219 Gastro-esophageal reflux disease without esophagitis: Secondary | ICD-10-CM | POA: Insufficient documentation

## 2012-04-14 DIAGNOSIS — R110 Nausea: Secondary | ICD-10-CM | POA: Diagnosis not present

## 2012-04-14 DIAGNOSIS — R5381 Other malaise: Secondary | ICD-10-CM | POA: Diagnosis not present

## 2012-04-14 LAB — URINE MICROSCOPIC-ADD ON

## 2012-04-14 LAB — CBC WITH DIFFERENTIAL/PLATELET
Basophils Absolute: 0 10*3/uL (ref 0.0–0.1)
Basophils Relative: 0 % (ref 0–1)
Eosinophils Absolute: 0 10*3/uL (ref 0.0–0.7)
Eosinophils Relative: 0 % (ref 0–5)
HCT: 36.1 % (ref 36.0–46.0)
Hemoglobin: 12.5 g/dL (ref 12.0–15.0)
Lymphocytes Relative: 15 % (ref 12–46)
Lymphs Abs: 1.5 10*3/uL (ref 0.7–4.0)
MCH: 29.6 pg (ref 26.0–34.0)
MCHC: 34.6 g/dL (ref 30.0–36.0)
MCV: 85.5 fL (ref 78.0–100.0)
Monocytes Absolute: 0.9 10*3/uL (ref 0.1–1.0)
Monocytes Relative: 9 % (ref 3–12)
Neutro Abs: 7.5 10*3/uL (ref 1.7–7.7)
Neutrophils Relative %: 75 % (ref 43–77)
Platelets: 189 10*3/uL (ref 150–400)
RBC: 4.22 MIL/uL (ref 3.87–5.11)
RDW: 12.7 % (ref 11.5–15.5)
WBC: 10 10*3/uL (ref 4.0–10.5)

## 2012-04-14 LAB — URINALYSIS, ROUTINE W REFLEX MICROSCOPIC
Glucose, UA: NEGATIVE mg/dL
Ketones, ur: 40 mg/dL — AB
Nitrite: NEGATIVE
Protein, ur: NEGATIVE mg/dL
Specific Gravity, Urine: 1.02 (ref 1.005–1.030)
Urobilinogen, UA: 0.2 mg/dL (ref 0.0–1.0)
pH: 6 (ref 5.0–8.0)

## 2012-04-14 LAB — BASIC METABOLIC PANEL
BUN: 36 mg/dL — ABNORMAL HIGH (ref 6–23)
CO2: 24 mEq/L (ref 19–32)
Calcium: 10 mg/dL (ref 8.4–10.5)
Chloride: 104 mEq/L (ref 96–112)
Creatinine, Ser: 0.92 mg/dL (ref 0.50–1.10)
GFR calc Af Amer: 65 mL/min — ABNORMAL LOW (ref >90)
GFR calc non Af Amer: 56 mL/min — ABNORMAL LOW (ref >90)
Glucose, Bld: 149 mg/dL — ABNORMAL HIGH (ref 70–99)
Potassium: 3.1 mEq/L — ABNORMAL LOW (ref 3.5–5.1)
Sodium: 140 mEq/L (ref 135–145)

## 2012-04-14 MED ORDER — POTASSIUM CHLORIDE CRYS ER 20 MEQ PO TBCR
60.0000 meq | EXTENDED_RELEASE_TABLET | Freq: Once | ORAL | Status: AC
  Administered 2012-04-14: 60 meq via ORAL
  Filled 2012-04-14: qty 3

## 2012-04-14 MED ORDER — SODIUM CHLORIDE 0.9 % IV BOLUS (SEPSIS)
1000.0000 mL | Freq: Once | INTRAVENOUS | Status: AC
  Administered 2012-04-14: 1000 mL via INTRAVENOUS

## 2012-04-14 MED ORDER — LOPERAMIDE HCL 2 MG PO CAPS: 2.0000 mg | ORAL_CAPSULE | Freq: Four times a day (QID) | ORAL | Status: DC | PRN

## 2012-04-14 MED ORDER — MAGNESIUM SULFATE 40 MG/ML IJ SOLN
2.0000 g | Freq: Once | INTRAMUSCULAR | Status: AC
  Administered 2012-04-14: 2 g via INTRAVENOUS
  Filled 2012-04-14: qty 50

## 2012-04-14 NOTE — ED Notes (Signed)
Pt from home via EMS.  Diarrhea x5 days. Emesis starting 5 days ago and ending same day.  No flu shot. Yes for pneumonia shot. No abd pain.

## 2012-04-14 NOTE — Telephone Encounter (Signed)
[facility] Triage Call Report Triage Record Num: [id number] Operator: Revonda Humphrey Patient Name: Kristina Wilkinson Call Date & Time: 04/13/2012 9:01:26AM Patient Phone: [phone] PCP: Kristina Wilkinson Patient Gender: Female PCP Fax : [phone] Patient DOB: 02-16-1927 Practice Name: [facility] Reason for Call: Caller: Kristina Wilkinson/Patient; PCP: Other; CB#: [phone]; Call regarding Diarrhea; Sudden onset of Vomiting with diarrhea on Thurs am 2/27. Vomiting stopped at 24 hours but diarrhea continues. Has had 5 stools in the last 24 hours. Afebrile. Not eating but trying to drink ice water. Triaged in Diarrhea Guideline - Disposition: See Provider Within 24 Hours due to Diarrhea lasting longer than 48 hours and not improving with home care. Plans to be seen in office tomorrow, gave care information for diarrhea, call back perimeters. Protocol(s) Used: Diarrhea or Other Change in Bowel Habits Recommended Outcome per Protocol: See Provider within 24 hours Reason for Outcome: Diarrhea lasting longer than 48 hours AND not improving with home care Care Advice: Diarrheal Care: - Drink 2-3 quarts (2-3 liters) per day of low sugar content fluids, including over the counter oral hydration solution, unless directed otherwise by provider. - If accompanied by vomiting, take the fluids in frequent small sips or suck on ice chips. - Eat easily digested foods (such as bananas, rice, applesauce, toast, cooked cereals, soup, crackers, baked or boiled potato, or baked chicken or [place] without skin). - Do not eat high fiber, high fat, high sugar content foods, or highly seasoned foods. - Do not drink caffeinated or alcoholic beverages. - Avoid milk and milk products while having symptoms. As symptoms improve, gradually add back to diet. - Application of A&D ointment or witch hazel medicated pads may help anal irritation. - Antidiarrheal medications are usually unnecessary. If symptoms are severe,  consider nonprescription antidiarrheal and anti-motility drugs as directed by label or a provider. Do not take if have high fever or bloody diarrhea. If pregnant, do not take any medications not approved by your provider. - Consult your provider for advice regarding continuing prescription medication. ~ Go to the ED if you have developed signs and symptoms of dehydration such as very dry mouth and tongue; increased pulse rate at rest; no urine output for 12 hours or more; increasing weakness or drowsiness, or lightheadedness when trying to sit upright or standing. ~ 04/13/2012 9:16:26AM Page 1 of 1 [id number]

## 2012-04-14 NOTE — ED Provider Notes (Signed)
History     CSN: [id number]  Arrival date & time 04/14/12  0815   First MD Initiated Contact with Patient 04/14/12 [phone]      Chief Complaint  Patient presents with  . Diarrhea  . Emesis    (Consider location/radiation/quality/duration/timing/severity/associated sxs/prior treatment) HPI Comments: PT with no significant medical hx comes in with cc of diarrhea. Pt started having some emesis and diarrhea on Wednesday. Overtime, the emesis has resolved, but she continues to have 10 loose, watery BM a day. No abd pain associated with this. She denies eating anything suspicious. No one else in the home is havign similar symptoms. She denies any fevers, chills. No lightheadedness, dizziness. Pt has poor appetite, but she has been drinking plenty of fluids.   Patient is a 79 y.o. female presenting with diarrhea and vomiting. The history is provided by the patient.  Diarrhea Associated symptoms: no abdominal pain and no vomiting   Emesis Associated symptoms: diarrhea   Associated symptoms: no abdominal pain     Past Medical History  Diagnosis Date  . PN (peripheral neuropathy)     unknown etiol  . GERD (gastroesophageal reflux disease)   . Hypertension   . Elevated glucose   . Insomnia   . Diabetes mellitus type II     diet controlled   . Knee pain     Left Dr. Tomasa Blase  . LBP (low back pain)   . Arthritis   . Aneurysm     Past Surgical History  Procedure Laterality Date  . Total knee arthroplasty    . Cerebral aneurysm repair    . Knee arthroplasty  04/2009    Left  . Cerebral aneurysm repair    . Tubal ligation    . Joint replacement      Family History  Problem Relation Age of Onset  . Hypertension Other   . Arthritis Mother   . Hypertension Father     History  Substance Use Topics  . Smoking status: Former Games developer  . Smokeless tobacco: Not on file     Comment: quit in 1983  . Alcohol Use: No    OB History   Grav Para Term Preterm Abortions TAB SAB Ect  Mult Living                  Review of Systems  Constitutional: Negative for activity change.  HENT: Negative for facial swelling and neck pain.   Respiratory: Negative for cough, shortness of breath and wheezing.   Cardiovascular: Negative for chest pain.  Gastrointestinal: Positive for diarrhea. Negative for vomiting, abdominal pain, constipation, blood in stool and abdominal distention.  Genitourinary: Negative for hematuria and difficulty urinating.  Skin: Negative for color change.  Neurological: Negative for speech difficulty.  Hematological: Does not bruise/bleed easily.  Psychiatric/Behavioral: Negative for confusion.    Allergies  Gabapentin and Lyrica  Home Medications   Current Outpatient Rx  Name  Route  Sig  Dispense  Refill  . amitriptyline (ELAVIL) 25 MG tablet   Oral   Take 2 tablets (50 mg total) by mouth at bedtime.   180 tablet   3   . Propylene Glycol (SYSTANE BALANCE) 0.6 % SOLN   Ophthalmic   Apply 1 drop to eye daily.         . sulindac (CLINORIL) 150 MG tablet   Oral   Take 150 mg by mouth 2 (two) times daily as needed. For pain         .  triamterene-hydrochlorothiazide (MAXZIDE-25) 37.5-25 MG per tablet   Oral   Take 1 tablet by mouth daily.           BP 161/59  Pulse 85  Temp(Src) 98.4 F (36.9 C) (Oral)  SpO2 100%  Physical Exam  Nursing note and vitals reviewed. Constitutional: She is oriented to person, place, and time. She appears well-developed.  HENT:  Head: Normocephalic and atraumatic.  Eyes: Conjunctivae and EOM are normal. Pupils are equal, round, and reactive to light.  Neck: Normal range of motion. Neck supple.  Cardiovascular: Normal rate, regular rhythm and normal heart sounds.   Pulmonary/Chest: Effort normal and breath sounds normal. No respiratory distress.  Abdominal: Soft. Bowel sounds are normal. She exhibits no distension. There is no tenderness. There is no rebound and no guarding.  Neurological: She is  alert and oriented to person, place, and time.  Skin: Skin is warm and dry.    ED Course  Procedures (including critical care time)  Labs Reviewed  CBC WITH DIFFERENTIAL  BASIC METABOLIC PANEL  URINALYSIS, ROUTINE W REFLEX MICROSCOPIC   No results found.   No diagnosis found.    MDM  Pt comes in with cc of diarrhea. Pt has no significant medical hx. Diarrhea for 5 days, 10 BM/day - slightly dry mucus membranes, but not dizzy. Will hydrate. No specific etiology discerned per hx. She has no abd pain, no recent AB use and no bloody stools or fevers. Plan is to have patient see PCP if the symptoms persists beyond 10 days for outpatient workup. Will give imodium as sx have lasted for more than 5 days now.   Derwood Kaplan, MD 04/14/12 [phone]

## 2012-04-14 NOTE — Telephone Encounter (Signed)
Ov if not well Thx

## 2012-04-14 NOTE — ED Notes (Signed)
Pt states she went to [employer] on Wednesday for dinner; diarrhea and emesis began early Thursday morning.

## 2012-04-21 ENCOUNTER — Ambulatory Visit (INDEPENDENT_AMBULATORY_CARE_PROVIDER_SITE_OTHER): Payer: Medicare Other | Admitting: Internal Medicine

## 2012-04-21 ENCOUNTER — Encounter: Payer: Self-pay | Admitting: Internal Medicine

## 2012-04-21 ENCOUNTER — Other Ambulatory Visit (INDEPENDENT_AMBULATORY_CARE_PROVIDER_SITE_OTHER): Payer: Medicare Other

## 2012-04-21 VITALS — BP 155/85 | HR 76 | Temp 98.3°F | Resp 16 | Wt 178.0 lb

## 2012-04-21 DIAGNOSIS — E559 Vitamin D deficiency, unspecified: Secondary | ICD-10-CM | POA: Diagnosis not present

## 2012-04-21 DIAGNOSIS — M545 Low back pain, unspecified: Secondary | ICD-10-CM

## 2012-04-21 DIAGNOSIS — K5289 Other specified noninfective gastroenteritis and colitis: Secondary | ICD-10-CM

## 2012-04-21 DIAGNOSIS — E119 Type 2 diabetes mellitus without complications: Secondary | ICD-10-CM | POA: Diagnosis not present

## 2012-04-21 DIAGNOSIS — M4800 Spinal stenosis, site unspecified: Secondary | ICD-10-CM | POA: Diagnosis not present

## 2012-04-21 DIAGNOSIS — G609 Hereditary and idiopathic neuropathy, unspecified: Secondary | ICD-10-CM

## 2012-04-21 DIAGNOSIS — R7309 Other abnormal glucose: Secondary | ICD-10-CM

## 2012-04-21 DIAGNOSIS — R739 Hyperglycemia, unspecified: Secondary | ICD-10-CM

## 2012-04-21 DIAGNOSIS — K529 Noninfective gastroenteritis and colitis, unspecified: Secondary | ICD-10-CM

## 2012-04-21 LAB — BASIC METABOLIC PANEL
BUN: 14 mg/dL (ref 6–23)
CO2: 27 mEq/L (ref 19–32)
Calcium: 9.6 mg/dL (ref 8.4–10.5)
Chloride: 104 mEq/L (ref 96–112)
Creatinine, Ser: 0.9 mg/dL (ref 0.4–1.2)
GFR: 61.19 mL/min (ref >60.00)
Glucose, Bld: 154 mg/dL — ABNORMAL HIGH (ref 70–99)
Potassium: 4.3 mEq/L (ref 3.5–5.1)
Sodium: 140 mEq/L (ref 135–145)

## 2012-04-21 NOTE — Progress Notes (Signed)
  Subjective:    Patient ID: Kristina Wilkinson, female    DOB: Oct 09, 1927, 79 y.o.   MRN: [id number]  HPI  F/u ER visit fo gastroenteritis sx's x several days. Better now. C/o weakness... C/o LBP x long time irrad to R leg - better. She saw Dr Cleophas Dunker had xrays/CT - spinal stenosis - she was offered spinal injections. C/o hands and feet tingling x months. She started building a new house.  F/u HTN, insomnia BP is nl at home   Review of Systems  Constitutional: Positive for fatigue. Negative for chills, activity change, appetite change and unexpected weight change.  HENT: Negative for congestion, mouth sores and sinus pressure.   Eyes: Negative for visual disturbance.  Respiratory: Negative for cough and chest tightness.   Gastrointestinal: Positive for nausea and diarrhea. Negative for abdominal pain, constipation and abdominal distention.  Genitourinary: Negative for frequency, difficulty urinating and vaginal pain.  Musculoskeletal: Negative for back pain and gait problem.  Skin: Negative for pallor and rash.  Neurological: Positive for weakness. Negative for dizziness, tremors, numbness and headaches.  Psychiatric/Behavioral: Negative for confusion and sleep disturbance.       Objective:   Physical Exam  Constitutional: She appears well-developed. No distress.  Obese NAD  HENT:  Head: Normocephalic.  Right Ear: External ear normal.  Left Ear: External ear normal.  Nose: Nose normal.  Mouth/Throat: Oropharynx is clear and moist.  Eyes: Conjunctivae are normal. Pupils are equal, round, and reactive to light. Right eye exhibits no discharge. Left eye exhibits no discharge.  Neck: Normal range of motion. Neck supple. No JVD present. No tracheal deviation present. No thyromegaly present.  Cardiovascular: Normal rate, regular rhythm and normal heart sounds.   Pulmonary/Chest: No stridor. No respiratory distress. She has no wheezes.  Abdominal: Soft. Bowel sounds are normal. She  exhibits no distension and no mass. There is no tenderness. There is no rebound and no guarding.  Musculoskeletal: She exhibits tenderness (LS is tender). She exhibits no edema.  Lymphadenopathy:    She has no cervical adenopathy.  Neurological: She displays normal reflexes. No cranial nerve deficit. She exhibits normal muscle tone. Coordination normal.  Skin: No rash noted. No erythema.  Psychiatric: She has a normal mood and affect. Her behavior is normal. Judgment and thought content normal.          Assessment & Plan:

## 2012-04-21 NOTE — Assessment & Plan Note (Signed)
Continue with current prescription therapy as reflected on the Med list.  

## 2012-04-21 NOTE — Assessment & Plan Note (Signed)
  On diet  

## 2012-04-21 NOTE — Assessment & Plan Note (Signed)
Check labs 

## 2012-04-22 ENCOUNTER — Telehealth: Payer: Self-pay | Admitting: Internal Medicine

## 2012-04-22 LAB — VITAMIN B12: Vitamin B-12: 323 pg/mL (ref 211–911)

## 2012-04-22 NOTE — Telephone Encounter (Signed)
Kristina Wilkinson, please, inform patient that all labs are normal except for elev sugar Low carb diet Loose wt Keep ROV Thx

## 2012-04-22 NOTE — Telephone Encounter (Signed)
Left mess for patient to call back.  

## 2012-04-23 ENCOUNTER — Other Ambulatory Visit: Payer: Self-pay | Admitting: *Deleted

## 2012-04-23 MED ORDER — AMITRIPTYLINE HCL 25 MG PO TABS: 50.0000 mg | ORAL_TABLET | Freq: Every day | ORAL | Status: DC

## 2012-04-24 NOTE — Telephone Encounter (Signed)
Pt informed of results and MD's advisement.  

## 2012-07-28 ENCOUNTER — Other Ambulatory Visit: Payer: Self-pay | Admitting: Internal Medicine

## 2012-07-28 NOTE — Telephone Encounter (Signed)
Please advise refill? 

## 2012-07-29 ENCOUNTER — Encounter: Payer: Medicare Other | Admitting: Internal Medicine

## 2012-09-11 ENCOUNTER — Encounter: Payer: Self-pay | Admitting: Internal Medicine

## 2012-09-11 ENCOUNTER — Encounter (HOSPITAL_COMMUNITY): Payer: Self-pay | Admitting: Unknown Physician Specialty

## 2012-09-11 ENCOUNTER — Encounter (HOSPITAL_COMMUNITY): Payer: Self-pay | Admitting: Adult Health

## 2012-09-11 ENCOUNTER — Emergency Department (HOSPITAL_COMMUNITY): Payer: Medicare Other

## 2012-09-11 ENCOUNTER — Emergency Department (HOSPITAL_COMMUNITY)
Admission: EM | Admit: 2012-09-11 | Discharge: 2012-09-12 | Discharge status: Against Medical Advice | Payer: Medicare Other | Attending: Emergency Medicine | Admitting: Emergency Medicine

## 2012-09-11 ENCOUNTER — Ambulatory Visit (INDEPENDENT_AMBULATORY_CARE_PROVIDER_SITE_OTHER): Payer: Medicare Other | Admitting: Internal Medicine

## 2012-09-11 VITALS — BP 140/92 | HR 68 | Temp 98.0°F | Resp 16 | Wt 169.0 lb

## 2012-09-11 DIAGNOSIS — Z87891 Personal history of nicotine dependence: Secondary | ICD-10-CM | POA: Insufficient documentation

## 2012-09-11 DIAGNOSIS — Z8719 Personal history of other diseases of the digestive system: Secondary | ICD-10-CM | POA: Diagnosis not present

## 2012-09-11 DIAGNOSIS — J9819 Other pulmonary collapse: Secondary | ICD-10-CM | POA: Diagnosis not present

## 2012-09-11 DIAGNOSIS — H9209 Otalgia, unspecified ear: Secondary | ICD-10-CM | POA: Insufficient documentation

## 2012-09-11 DIAGNOSIS — Z8679 Personal history of other diseases of the circulatory system: Secondary | ICD-10-CM | POA: Diagnosis not present

## 2012-09-11 DIAGNOSIS — R1013 Epigastric pain: Secondary | ICD-10-CM | POA: Insufficient documentation

## 2012-09-11 DIAGNOSIS — R0789 Other chest pain: Secondary | ICD-10-CM | POA: Insufficient documentation

## 2012-09-11 DIAGNOSIS — E119 Type 2 diabetes mellitus without complications: Secondary | ICD-10-CM

## 2012-09-11 DIAGNOSIS — Z9289 Personal history of other medical treatment: Secondary | ICD-10-CM

## 2012-09-11 DIAGNOSIS — Z9189 Other specified personal risk factors, not elsewhere classified: Secondary | ICD-10-CM | POA: Diagnosis not present

## 2012-09-11 DIAGNOSIS — R079 Chest pain, unspecified: Secondary | ICD-10-CM | POA: Diagnosis not present

## 2012-09-11 DIAGNOSIS — M2669 Other specified disorders of temporomandibular joint: Secondary | ICD-10-CM | POA: Diagnosis not present

## 2012-09-11 DIAGNOSIS — I10 Essential (primary) hypertension: Secondary | ICD-10-CM | POA: Insufficient documentation

## 2012-09-11 DIAGNOSIS — R110 Nausea: Secondary | ICD-10-CM | POA: Diagnosis not present

## 2012-09-11 DIAGNOSIS — Z8669 Personal history of other diseases of the nervous system and sense organs: Secondary | ICD-10-CM | POA: Insufficient documentation

## 2012-09-11 DIAGNOSIS — Z79899 Other long term (current) drug therapy: Secondary | ICD-10-CM | POA: Diagnosis not present

## 2012-09-11 DIAGNOSIS — Z9089 Acquired absence of other organs: Secondary | ICD-10-CM | POA: Insufficient documentation

## 2012-09-11 DIAGNOSIS — H9202 Otalgia, left ear: Secondary | ICD-10-CM

## 2012-09-11 DIAGNOSIS — M129 Arthropathy, unspecified: Secondary | ICD-10-CM | POA: Diagnosis not present

## 2012-09-11 LAB — POCT I-STAT TROPONIN I: Troponin i, poc: 0.02 ng/mL (ref 0.00–0.08)

## 2012-09-11 LAB — BASIC METABOLIC PANEL
BUN: 31 mg/dL — ABNORMAL HIGH (ref 6–23)
CO2: 24 mEq/L (ref 19–32)
Calcium: 10.2 mg/dL (ref 8.4–10.5)
Chloride: 103 mEq/L (ref 96–112)
Creatinine, Ser: 0.99 mg/dL (ref 0.50–1.10)
GFR calc Af Amer: 59 mL/min — ABNORMAL LOW (ref >90)
GFR calc non Af Amer: 51 mL/min — ABNORMAL LOW (ref >90)
Glucose, Bld: 205 mg/dL — ABNORMAL HIGH (ref 70–99)
Potassium: 3.8 mEq/L (ref 3.5–5.1)
Sodium: 140 mEq/L (ref 135–145)

## 2012-09-11 LAB — CBC
HCT: 33.8 % — ABNORMAL LOW (ref 36.0–46.0)
Hemoglobin: 12 g/dL (ref 12.0–15.0)
MCH: 31 pg (ref 26.0–34.0)
MCHC: 35.5 g/dL (ref 30.0–36.0)
MCV: 87.3 fL (ref 78.0–100.0)
Platelets: 201 10*3/uL (ref 150–400)
RBC: 3.87 MIL/uL (ref 3.87–5.11)
RDW: 12.9 % (ref 11.5–15.5)
WBC: 9.5 10*3/uL (ref 4.0–10.5)

## 2012-09-11 MED ORDER — ASPIRIN 81 MG PO CHEW: 324.0000 mg | CHEWABLE_TABLET | Freq: Once | ORAL | Status: DC

## 2012-09-11 MED ORDER — NEOMYCIN-POLYMYXIN-HC 3.5-10000-1 OT SOLN: 3.0000 [drp] | Freq: Three times a day (TID) | OTIC | Status: DC

## 2012-09-11 MED ORDER — TRIAMCINOLONE ACETONIDE 0.5 % EX OINT: TOPICAL_OINTMENT | Freq: Two times a day (BID) | CUTANEOUS | Status: DC

## 2012-09-11 NOTE — Progress Notes (Signed)
   Subjective:    Otalgia  There is pain in the left ear. This is a new problem. The current episode started 1 to 4 weeks ago. The problem occurs every few minutes. The problem has been unchanged. The pain is moderate. Associated symptoms include diarrhea. Pertinent negatives include no abdominal pain, coughing, headaches or rash. There is no history of a chronic ear infection.  Grinding sensation with chewing   F/u LBP x long time irrad to R leg - better. She saw Dr Cleophas Dunker had xrays/CT - spinal stenosis - she was offered spinal injections. C/o hands and feet tingling x months. She started building a new house.  F/u HTN, insomnia BP is nl at home   Review of Systems  Constitutional: Positive for fatigue. Negative for chills, activity change, appetite change and unexpected weight change.  HENT: Positive for ear pain. Negative for congestion, mouth sores and sinus pressure.   Eyes: Negative for visual disturbance.  Respiratory: Negative for cough and chest tightness.   Gastrointestinal: Positive for nausea and diarrhea. Negative for abdominal pain, constipation and abdominal distention.  Genitourinary: Negative for frequency, difficulty urinating and vaginal pain.  Musculoskeletal: Negative for back pain and gait problem.  Skin: Negative for pallor and rash.  Neurological: Positive for weakness. Negative for dizziness, tremors, numbness and headaches.  Psychiatric/Behavioral: Negative for confusion and sleep disturbance.  B ears WNL TMJs are grinding     Objective:   Physical Exam  Constitutional: She appears well-developed. No distress.  Obese NAD  HENT:  Head: Normocephalic.  Right Ear: External ear normal.  Left Ear: External ear normal.  Nose: Nose normal.  Mouth/Throat: Oropharynx is clear and moist.  Eyes: Conjunctivae are normal. Pupils are equal, round, and reactive to light. Right eye exhibits no discharge. Left eye exhibits no discharge.  Neck: Normal range of  motion. Neck supple. No JVD present. No tracheal deviation present. No thyromegaly present.  Cardiovascular: Normal rate, regular rhythm and normal heart sounds.   Pulmonary/Chest: No stridor. No respiratory distress. She has no wheezes.  Abdominal: Soft. Bowel sounds are normal. She exhibits no distension and no mass. There is no tenderness. There is no rebound and no guarding.  Musculoskeletal: She exhibits tenderness (LS is tender). She exhibits no edema.  Lymphadenopathy:    She has no cervical adenopathy.  Neurological: She displays normal reflexes. No cranial nerve deficit. She exhibits normal muscle tone. Coordination normal.  Skin: No rash noted. No erythema.  Psychiatric: She has a normal mood and affect. Her behavior is normal. Judgment and thought content normal.    Lab Results  Component Value Date   WBC 9.5 09/11/2012   HGB 12.0 09/11/2012   HCT 33.8* 09/11/2012   PLT 201 09/11/2012   GLUCOSE 154* 04/21/2012   CHOL 196 01/22/2012   TRIG 125.0 01/22/2012   HDL 50.30 01/22/2012   LDLDIRECT 133.5 01/28/2007   LDLCALC 121* 01/22/2012   ALT 20 01/22/2012   ALT 20 01/22/2012   AST 21 01/22/2012   AST 21 01/22/2012   NA 140 04/21/2012   K 4.3 04/21/2012   CL 104 04/21/2012   CREATININE 0.9 04/21/2012   BUN 14 04/21/2012   CO2 27 04/21/2012   TSH 0.95 01/22/2012   INR 1.02 11/03/2010   HGBA1C 6.9* 01/22/2012         Assessment & Plan:

## 2012-09-11 NOTE — Assessment & Plan Note (Signed)
Continue with current prescription therapy as reflected on the Med list. Labs  

## 2012-09-11 NOTE — Assessment & Plan Note (Signed)
7/14 L

## 2012-09-11 NOTE — Assessment & Plan Note (Addendum)
7/14 L  likely TMJ related Cortisporin otic empiric

## 2012-09-11 NOTE — ED Notes (Signed)
Presents with onset of substernal chest pain with radiation through to the back described as "just a pain and a lot of burping" that began 20:00 this eveing associated with nausea. Nothing makes pain worse, nothing makes pain better.  Pain is constant.

## 2012-09-11 NOTE — Assessment & Plan Note (Signed)
Labs

## 2012-09-11 NOTE — Patient Instructions (Addendum)
Wt Readings from Last 3 Encounters:  09/11/12 169 lb (76.658 kg)  04/21/12 178 lb (80.74 kg)  01/29/12 179 lb (81.194 kg)   BP Readings from Last 3 Encounters:  09/11/12 140/92  04/21/12 155/85  04/14/12 160/52    Eat softer food

## 2012-09-11 NOTE — ED Provider Notes (Signed)
CSN: [id number]     Arrival date & time 09/11/12  2116 History     First MD Initiated Contact with Patient 09/11/12 2300     Chief Complaint  Patient presents with  . Chest Pain   (Consider location/radiation/quality/duration/timing/severity/associated sxs/prior Treatment) HPI History per patient. At home tonight about 90 min after dinner developed epigastric and lower chest pain that radiated to her R sided, sharp and dull in [place], states it felt kind of like my GB did, she is s/p Chole 2012. No SOB, she did have some nausea, no diaphoresis, no F/C, pain mod in severity, took 4 tums without relief. Pain lasted about 3 hours. Pain is now 0/10.   Past Medical History  Diagnosis Date  . PN (peripheral neuropathy)     unknown etiol  . GERD (gastroesophageal reflux disease)   . Hypertension   . Elevated glucose   . Insomnia   . Diabetes mellitus type II     diet controlled   . Knee pain     Left Dr. Tomasa Blase  . LBP (low back pain)   . Arthritis   . Aneurysm    Past Surgical History  Procedure Laterality Date  . Total knee arthroplasty    . Cerebral aneurysm repair    . Knee arthroplasty  04/2009    Left  . Cerebral aneurysm repair    . Tubal ligation    . Joint replacement     Family History  Problem Relation Age of Onset  . Hypertension Other   . Arthritis Mother   . Hypertension Father    History  Substance Use Topics  . Smoking status: Former Games developer  . Smokeless tobacco: Not on file     Comment: quit in 1983  . Alcohol Use: No   OB History   Grav Para Term Preterm Abortions TAB SAB Ect Mult Living                 Review of Systems  Constitutional: Negative for fever and chills.  HENT: Negative for neck pain.   Eyes: Negative for visual disturbance.  Respiratory: Negative for shortness of breath.   Cardiovascular: Positive for chest pain.  Gastrointestinal: Positive for nausea and abdominal pain. Negative for vomiting.  Genitourinary: Negative for  flank pain.  Musculoskeletal: Negative for back pain.  Skin: Negative for rash.  Neurological: Negative for headaches.  All other systems reviewed and are negative.    Allergies  Gabapentin and Lyrica  Home Medications   Current Outpatient Rx  Name  Route  Sig  Dispense  Refill  . neomycin-polymyxin-hydrocortisone (CORTISPORIN) otic solution   Left Ear   Place 3 drops into the left ear 3 (three) times daily.   10 mL   3   . Propylene Glycol (SYSTANE BALANCE) 0.6 % SOLN   Both Eyes   Place 1 drop into both eyes 3 (three) times daily.          . sulindac (CLINORIL) 150 MG tablet   Oral   Take 1 tablet (150 mg total) by mouth 2 (two) times daily.   180 tablet   1   . triamterene-hydrochlorothiazide (MAXZIDE-25) 37.5-25 MG per tablet   Oral   Take 1 tablet by mouth daily.   90 tablet   3   . triamcinolone ointment (KENALOG) 0.5 %   Topical   Apply topically 2 (two) times daily. For itching/rash   30 g   1    BP 157/63  Pulse  68  Temp(Src) 98.1 F (36.7 C) (Oral)  Resp 18  Wt 169 lb (76.658 kg)  BMI 31.95 kg/m2  SpO2 98% Physical Exam  Constitutional: She is oriented to person, place, and time. She appears well-developed and well-nourished.  HENT:  Head: Normocephalic and atraumatic.  Eyes: Conjunctivae and EOM are normal. Pupils are equal, round, and reactive to light.  Neck: Trachea normal. Neck supple. No thyromegaly present.  Cardiovascular: Normal rate, regular rhythm, S1 normal, S2 normal and normal pulses.   Pulses:      Radial pulses are 2+ on the right side, and 2+ on the left side.  Pulmonary/Chest: Effort normal and breath sounds normal. She has no wheezes. She has no rhonchi. She has no rales. She exhibits no tenderness.  Abdominal: Soft. Normal appearance and bowel sounds are normal. There is no tenderness. There is no rebound, no guarding, no CVA tenderness and negative Murphy's sign.  Musculoskeletal:  calves nontender, no cords or erythema,  negative Homans sign  Neurological: She is alert and oriented to person, place, and time. She has normal strength. No cranial nerve deficit or sensory deficit. GCS eye subscore is 4. GCS verbal subscore is 5. GCS motor subscore is 6.  Skin: Skin is warm and dry. No rash noted. She is not diaphoretic.  Psychiatric: Her speech is normal.  Cooperative and appropriate    ED Course   Procedures (including critical care time)  Results for orders placed during the hospital encounter of 09/11/12  CBC      Result Value Range   WBC 9.5  4.0 - 10.5 K/uL   RBC 3.87  3.87 - 5.11 MIL/uL   Hemoglobin 12.0  12.0 - 15.0 g/dL   HCT 16.1 (*) 09.6 - 04.5 %   MCV 87.3  78.0 - 100.0 fL   MCH 31.0  26.0 - 34.0 pg   MCHC 35.5  30.0 - 36.0 g/dL   RDW 40.9  81.1 - 91.4 %   Platelets 201  150 - 400 K/uL  BASIC METABOLIC PANEL      Result Value Range   Sodium 140  135 - 145 mEq/L   Potassium 3.8  3.5 - 5.1 mEq/L   Chloride 103  96 - 112 mEq/L   CO2 24  19 - 32 mEq/L   Glucose, Bld 205 (*) 70 - 99 mg/dL   BUN 31 (*) 6 - 23 mg/dL   Creatinine, Ser 7.82  0.50 - 1.10 mg/dL   Calcium 95.6  8.4 - 21.3 mg/dL   GFR calc non Af Amer 51 (*) >90 mL/min   GFR calc Af Amer 59 (*) >90 mL/min  POCT I-STAT TROPONIN I      Result Value Range   Troponin i, poc 0.02  0.00 - 0.08 ng/mL   Comment 3            Dg Chest 2 View  09/11/2012   *RADIOLOGY REPORT*  Clinical Data: Chest pain radiating to the back.  CHEST - 2 VIEW  Comparison: 09/17/2010.  Findings: Low volume chest with basilar atelectasis. Cardiopericardial silhouette appears within normal limits.  Aortic arch atherosclerosis. No pneumothorax.  No airspace disease.  No pleural effusion. Cholecystectomy clips are present in the right upper quadrant. Compared to the prior, there is no interval change in the configuration of the thoracic aorta.  IMPRESSION: Low volume chest with basilar atelectasis. No acute abnormality.   Original Report Authenticated By: Andreas Newport, M.D.     Date: 09/11/2012  Rate: 79  Rhythm: normal sinus rhythm  QRS Axis: normal  Intervals: normal  ST/T Wave abnormalities: nonspecific ST changes  Conduction Disutrbances:none  Narrative Interpretation: sinus low voltage  Old EKG Reviewed: unchanged  ASA provided  11:21 PM I reviewed ECG, CXR and labs results as above with PT. I recommended that she be admitted to the hospital for further evaluation of her CP.  She very much wishes to go home, states " I just don't want to be in the hospital".  She states understanding risk of leaving AMA including risk of death or serious disability should her pain represent heart attack or other life threatening process. A/O x 4. No indication for IVC.  MDM  CP 79 yo female with h/o HTN and diet controlled DM  ECG, labs, CXR  Serial evaluations and discussions recommending admit  PT left AMA with neighbors/ friends who also encouraged her to stay      Sunnie Nielsen, MD 09/12/12 [phone]

## 2012-09-12 ENCOUNTER — Telehealth: Payer: Self-pay | Admitting: Internal Medicine

## 2012-09-12 NOTE — Telephone Encounter (Signed)
Sorry to hear about it. Can she RTC in 2 weeks? Thx

## 2012-09-12 NOTE — ED Notes (Signed)
Had long talk with pt concerning being admitted to hospital.  Pt stated multiple times that she would come back to the hospital if she started having trouble, but that she just wanted to go home.

## 2012-09-12 NOTE — Telephone Encounter (Signed)
Pt called stated that she was in the ER last night for chest pain. Pt was discharge last night because everything look ok corroding to the test result. Pt request a call from the nurse to follow up on what happened last night. Please call pt.

## 2012-09-12 NOTE — Telephone Encounter (Signed)
Spoke with pt, advised pt of MDs note.

## 2012-09-12 NOTE — Telephone Encounter (Signed)
Spoke with pt, she wants Dr Posey Rea know to she went to the ER after her visit yesterday due to radiating chest pain.  All cardiac tests were clear. Pt wants to know does she need to come in for another OV?

## 2012-09-18 ENCOUNTER — Telehealth: Payer: Self-pay | Admitting: *Deleted

## 2012-09-18 NOTE — Telephone Encounter (Signed)
Rf req for Hydroco/APAP 5/325 1 po bid prn. #60. Last filled 08/17/11? Ok to Rf?

## 2012-09-18 NOTE — Telephone Encounter (Signed)
OK to fill this prescription with additional refills x3 Thank you!  

## 2012-09-19 ENCOUNTER — Telehealth: Payer: Self-pay | Admitting: *Deleted

## 2012-09-19 NOTE — Telephone Encounter (Signed)
Pharmacy called, Rx order given by phone.

## 2012-09-19 NOTE — Telephone Encounter (Signed)
OK to fill this prescription with additional refills x2 Thank you!  

## 2012-09-19 NOTE — Telephone Encounter (Signed)
Rf req for Hydroco/APAP 5/325 1 po bid. # 60. Last filled 08/17/11. Ok to Rf?

## 2012-09-22 MED ORDER — HYDROCODONE-ACETAMINOPHEN 5-325 MG PO TABS: 1.0000 | ORAL_TABLET | Freq: Two times a day (BID) | ORAL | Status: DC | PRN

## 2012-09-22 NOTE — Telephone Encounter (Signed)
Done

## 2012-11-20 ENCOUNTER — Telehealth: Payer: Self-pay | Admitting: *Deleted

## 2012-11-20 DIAGNOSIS — M792 Neuralgia and neuritis, unspecified: Secondary | ICD-10-CM

## 2012-11-20 NOTE — Telephone Encounter (Signed)
Francely called requesting a referral to Dr Allena Katz in Neurology.  Please advise

## 2012-11-20 NOTE — Telephone Encounter (Signed)
What is it for? Thx 

## 2012-11-21 NOTE — Telephone Encounter (Signed)
Pt request referral for continued foot pain and swelling which causes her not to be able to walk. Per pt, MD is familiar with this. Thanks

## 2012-11-25 NOTE — Telephone Encounter (Signed)
Done. Thx.

## 2012-11-25 NOTE — Telephone Encounter (Signed)
10.14.2014  Pt called in to check status of referral to see Dr. Allena Katz, for her foot pain and swelling.  Pt stated that she would like to see Dr. Allena Katz before her office visit with Dr. Posey Rea on 12.2.2014.

## 2012-11-26 NOTE — Telephone Encounter (Signed)
Notified patient that referral has been made...ds,cma

## 2012-12-09 ENCOUNTER — Other Ambulatory Visit: Payer: Self-pay | Admitting: Neurology

## 2012-12-09 ENCOUNTER — Ambulatory Visit (INDEPENDENT_AMBULATORY_CARE_PROVIDER_SITE_OTHER): Payer: Medicare Other | Admitting: Neurology

## 2012-12-09 ENCOUNTER — Encounter: Payer: Self-pay | Admitting: Neurology

## 2012-12-09 VITALS — BP 140/80 | HR 78 | Temp 98.0°F | Resp 14 | Ht 61.0 in | Wt 170.1 lb

## 2012-12-09 DIAGNOSIS — G609 Hereditary and idiopathic neuropathy, unspecified: Secondary | ICD-10-CM

## 2012-12-09 DIAGNOSIS — E1149 Type 2 diabetes mellitus with other diabetic neurological complication: Secondary | ICD-10-CM | POA: Diagnosis not present

## 2012-12-09 MED ORDER — AMITRIPTYLINE HCL 25 MG PO TABS: 25.0000 mg | ORAL_TABLET | Freq: Every day | ORAL | Status: DC

## 2012-12-09 NOTE — Progress Notes (Signed)
[facility] Clinic Note - Initial Visit   Date: 12/09/2012    Kristina Wilkinson MRN: [id number] DOB: [birth year]   Dear Dr Posey Rea: Thank you for your kind referral of Kristina Wilkinson for consultation of bilateral feet numbness. Although her history is well known to you, please allow [place] to reiterate it for the purpose of our medical record. The patient was accompanied to the clinic by self.   History of Present Illness: Kristina Wilkinson is a 79 y.o. year-old right-handed Caucasian female with history of CNS aneurysm s/p clipping 1983, diet controlled diabetes mellitus (HbA1c 6.9), osteoarthritis, and hypertension presenting for evaluation bilateral feet numbness.   Around 2011, she started noticing numbness/tingling of her feet which was intermittent at first, but has become more constant and intense over time. She feels as if she is walking on rocks.  She is most bothered by sharp, pins and needles sensation of her feet.  It is worse when she is active and working on her feet and improved with rest.  She has not had any falls and continues to walk independently.  She does have associated numbness, which makes her feel as if her feet are swollen.  She has occasional tingling of her finger tips bilaterally. Denies any weakness of her hands or feet.  She has previously seen neurology in 2012 who started her on amitriptyline which helped with her leg pain, but symptoms were different from what she is having currently.  It stopped it after her symptoms resolved.   Out-side paper records, electronic medical record, and images have been reviewed where available and summarized as:  Component     Latest Ref Rng 07/17/2011 01/22/2012 04/21/2012  Hemoglobin A1C     4.6 - 6.5 % 6.4 6.9 (H)   TSH     0.35 - 5.50 uIU/mL  0.95   Vitamin B-12     211 - 911 pg/mL   323     Past Medical History  Diagnosis Date  . PN (peripheral neuropathy)     unknown etiol  . GERD  (gastroesophageal reflux disease)   . Hypertension   . Elevated glucose   . Insomnia   . Diabetes mellitus type II     diet controlled   . Knee pain     Left Dr. Tomasa Blase  . LBP (low back pain)   . Arthritis   . CNS aneurysm 1983    s/p clipping    Past Surgical History  Procedure Laterality Date  . Total knee arthroplasty    . Cerebral aneurysm repair    . Knee arthroplasty  04/2009    Left  . Cerebral aneurysm repair    . Tubal ligation    . Joint replacement    . Cerebral aneurysm repair       Medications:  Current Outpatient Prescriptions on File Prior to Visit  Medication Sig Dispense Refill  . HYDROcodone-acetaminophen (NORCO/VICODIN) 5-325 MG per tablet Take 1-2 tablets by mouth 2 (two) times daily as needed for pain.  60 tablet  2  . Propylene Glycol (SYSTANE BALANCE) 0.6 % SOLN Place 1 drop into both eyes 3 (three) times daily.       . sulindac (CLINORIL) 150 MG tablet Take 1 tablet (150 mg total) by mouth 2 (two) times daily.  180 tablet  1  . triamcinolone ointment (KENALOG) 0.5 % Apply topically 2 (two) times daily. For itching/rash  30 g  1  . triamterene-hydrochlorothiazide (MAXZIDE-25) 37.5-25 MG  per tablet Take 1 tablet by mouth daily.  90 tablet  3   Current Facility-Administered Medications on File Prior to Visit  Medication Dose Route Frequency Provider Last Rate Last Dose  . methylPREDNISolone acetate (DEPO-MEDROL) injection 40 mg  40 mg Intra-articular Once Tresa Garter, MD        Allergies:  Allergies  Allergen Reactions  . Gabapentin     REACTION: numb legs  . Lyrica [Pregabalin] Other (See Comments)    REACTION: makes me pass out    Family History: Family History  Problem Relation Age of Onset  . Hypertension Mother     Died, 75  . Arthritis Mother   . Hypertension Father     Died, 79  . Arthritis-Osteo Son     Social History: History   Social History  . Marital Status: Widowed    Spouse Name: N/A    Number of Children:  1  . Years of Education: N/A   Occupational History  . retired    Social History Main Topics  . Smoking status: Former Games developer  . Smokeless tobacco: Not on file     Comment: quit in 25-Jun-1981  . Alcohol Use: No  . Drug Use: No  . Sexual Activity: Not Currently   Other Topics Concern  . Not on file   Social History Narrative   Lives alone in a [place] home.  Husband passed away in 1998/06/26.   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    Review of Systems:  CONSTITUTIONAL: No fevers, chills, night sweats, or weight loss.   EYES: No visual changes or eye pain ENT: No hearing changes.  No history of nose bleeds.   RESPIRATORY: No cough, wheezing and shortness of breath.   CARDIOVASCULAR: Negative for chest pain, and palpitations.   GI: Negative for abdominal discomfort, blood in stools or black stools.  No recent change in bowel habits.   GU:  No history of incontinence.   MUSCLOSKELETAL: +history of joint pain or swelling.  No myalgias.   SKIN: Negative for lesions, rash, and itching.   HEMATOLOGY/ONCOLOGY: Negative for prolonged bleeding, bruising easily, and swollen nodes.  No history of cancer.   ENDOCRINE: Negative for cold or heat intolerance, polydipsia or goiter.   PSYCH:  No depression or anxiety symptoms.   NEURO: As Above.   Vital Signs:  BP 140/80  Pulse 78  Temp(Src) 98 F (36.7 C)  Resp 14  Ht 5\' 1"  (1.549 m)  Wt 170 lb 1.6 oz (77.157 kg)  BMI 32.16 kg/m2  Neurological Exam: MENTAL STATUS including orientation to time, place, person, recent and remote memory, attention span and concentration, language, and fund of knowledge is normal.  Speech is not dysarthric.  CRANIAL NERVES: II:  No visual field defects.  Unremarkable fundi.   III-IV-VI: Pupils equal round and reactive to light.  Normal conjugate, extra-ocular eye movements in all directions of gaze.  No nystagmus.  No ptosis.   V:  Normal facial sensation.   VII:  Normal facial symmetry and  movements.   VIII:  Normal hearing and vestibular function.   IX-X:  Normal palatal movement.   XI:  Normal shoulder shrug and head rotation.   XII:  Normal tongue strength and range of motion, no deviation or fasciculation.  MOTOR:  No atrophy, fasciculations or abnormal movements.  No pronator drift.     Right Upper Extremity:    Left Upper Extremity:    Deltoid  5/5  Deltoid  5/5   Biceps  5/5   Biceps  5/5   Triceps  5/5   Triceps  5/5   Wrist extensors  5/5   Wrist extensors  5/5   Wrist flexors  5/5   Wrist flexors  5/5   Finger extensors  5/5   Finger extensors  5/5   Finger flexors  5/5   Finger flexors  5/5   Dorsal interossei  5/5   Dorsal interossei  5/5   Abductor pollicis  5-/5   Abductor pollicis  5-/5   Tone (Ashworth scale)  0  Tone (Ashworth scale)  0   Right Lower Extremity:    Left Lower Extremity:    Hip flexors  5/5   Hip flexors  5-/5   Hip extensors  5/5   Hip extensors  5/5   Knee flexors  5/5   Knee flexors  5/5   Knee extensors  5/5   Knee extensors  5/5   Dorsiflexors  5/5   Dorsiflexors  5/5   Plantarflexors  5/5   Plantarflexors  5/5   Toe extensors  5-/5   Toe extensors  5-/5   Toe flexors  5-/5   Toe flexors  5-/5   Tone (Ashworth scale)  0  Tone (Ashworth scale)  0   MSRs:  Right                                                                 Left brachioradialis 3+  brachioradialis 3+  biceps 3+  biceps 3+  triceps 3+  triceps 3+  patellar 3+  patellar 3+  ankle jerk 0  ankle jerk 0  Hoffman yes  Hoffman yes  plantar response up  plantar response down  Crossed adductors present. No ankle clonus.  SENSORY:  Light touch and vibration absent distal to ankles bilaterally, normal at knees.  Pin prick and temperature is reduced in a gradient fashion below the knees and absent in the feet.  Impaired proprioception at the great toe bilaterally, normal at ankles and DIP.   Romberg's sign present.   COORDINATION/GAIT: Normal finger-to- nose-finger  and heel-to-shin.  Finger tapping intact, mildly reduced amplitude of toe tapping bilaterally.  Able to rise from a chair without using arms.  Gait narrow based and stable. She is unable to perform tandem and stressed gait.    IMPRESSION: Kristina Wilkinson is a delightful 79 year-old female presenting for evaluation of paresthesias involving her feet.  Her neurological examination is consistent with a moderate length dependent large and small fiber sensorimotor polyneuropathy.  Her gait unsteadiness is due to lack of proprioception.  Based on laboratory testing, she has diabetes with a most recent  Hemoglobin A1c of 6.9 and I explained that the most likely etiology of her neuropathy is diabetes. Also of note, her B12 was recently checked and was low-normal. I will check for up the treatable causes. I do not feel that an EMG is indicated at this time, since her paresthesias and exam are consistent with a peripheral neuropathy. For symptomatic management, I will start her on amitriptyline which she has had benefit from in the past. Her most recent EKG from July 2014 shows borderline QTc of 465.  I have discussed the risks and  benefits of amitriptyline and instructed her to stop using it if she develops palpitations; although starting at a low dose, I feel relatively safe using it.  She also has generalized brisk reflexes throughout except at the Achilles which is absent. There is an extensor plantar response on the right side and her muscle tone is normal.  She has a history of spinal stenosis in her lumbar region and I would like to obtain cervical imaging to better assess her upper motor neuron findings, but patient has declined imaging at this time.   PLAN/RECOMMENDATIONS:  1.  Check MMA, copper, ceruloplasmin, UPEP/SPEP with IFE 2.  CT cervical spine wo contrast deferred at this time 3.  Start amitriptyline 25mg  at bedtime 4.  Encouraged her to have tight glycemic control to prevent further progression of  neuropathy  5.  Return to clinic in 6-weeks   The duration of this appointment visit was 60 minutes of face-to-face time with the patient.  Greater than 50% of this time was spent in counseling, explanation of diagnosis, planning of further management, and coordination of care.   Thank you for allowing me to participate in patient's care.  If I can answer any additional questions, I would be pleased to do so.    Sincerely,    Donika K. Allena Katz, DO

## 2012-12-09 NOTE — Patient Instructions (Addendum)
1.  Check blood work today 3.  Start amitriptyline 25mg  at bedtime 3.  Return to clinic in 6-weeks

## 2012-12-10 LAB — CERULOPLASMIN: Ceruloplasmin: 43 mg/dL (ref 20–60)

## 2012-12-11 LAB — PROTEIN ELECTROPHORESIS, SERUM
Albumin ELP: 57.7 % (ref 55.8–66.1)
Alpha-1-Globulin: 4.6 % (ref 2.9–4.9)
Alpha-2-Globulin: 11.9 % — ABNORMAL HIGH (ref 7.1–11.8)
Beta 2: 6.2 % (ref 3.2–6.5)
Beta Globulin: 7.9 % — ABNORMAL HIGH (ref 4.7–7.2)
Gamma Globulin: 11.7 % (ref 11.1–18.8)
Total Protein, Serum Electrophoresis: 6.8 g/dL (ref 6.0–8.3)

## 2012-12-11 LAB — IMMUNOFIXATION ELECTROPHORESIS
IgA: 346 mg/dL (ref 69–380)
IgG (Immunoglobin G), Serum: 847 mg/dL (ref 690–1700)
IgM, Serum: 76 mg/dL (ref 52–322)
Total Protein, Serum Electrophoresis: 6.8 g/dL (ref 6.0–8.3)

## 2012-12-11 LAB — METHYLMALONIC ACID, SERUM: Methylmalonic Acid, Quant: 0.3 umol/L (ref <0.40)

## 2012-12-11 LAB — COPPER, SERUM: Copper: 152 ug/dL (ref 70–175)

## 2012-12-12 ENCOUNTER — Other Ambulatory Visit: Payer: Self-pay | Admitting: Neurology

## 2012-12-12 ENCOUNTER — Telehealth: Payer: Self-pay

## 2012-12-12 DIAGNOSIS — E1149 Type 2 diabetes mellitus with other diabetic neurological complication: Secondary | ICD-10-CM | POA: Diagnosis not present

## 2012-12-12 DIAGNOSIS — G609 Hereditary and idiopathic neuropathy, unspecified: Secondary | ICD-10-CM | POA: Diagnosis not present

## 2012-12-12 NOTE — Telephone Encounter (Signed)
Called pt to inform of lab results 

## 2012-12-16 LAB — PROTEIN ELECTROPHORESIS, URINE REFLEX
Total Protein, Urine/Day: 30 mg/d — ABNORMAL LOW (ref 50–100)
Total Protein, Urine: 3 mg/dL

## 2012-12-22 ENCOUNTER — Telehealth: Payer: Self-pay

## 2012-12-22 NOTE — Telephone Encounter (Signed)
Spoke with patient about halving medication she said she already tried that and it has the same effect so she would  prefer to discontinue it and discuss other options at next office visit

## 2012-12-22 NOTE — Telephone Encounter (Signed)
Please let patient know that she can take half-tablet at bedtime and see if that resolves some of the side effects, otherwise, she can stop it.  We can discuss alternative medications at her next visit.  Taisei Bonnette K. Allena Katz, DO

## 2012-12-22 NOTE — Telephone Encounter (Signed)
Patient states that she is having a number of side affects and has not been feeling well since she stated the Amytriptiline 25mg . She is not taking it any more and does not want to take it again until she come in for a follow up December 9. Please advise.////esa

## 2013-01-06 ENCOUNTER — Other Ambulatory Visit (INDEPENDENT_AMBULATORY_CARE_PROVIDER_SITE_OTHER): Payer: Medicare Other

## 2013-01-06 DIAGNOSIS — Z9189 Other specified personal risk factors, not elsewhere classified: Secondary | ICD-10-CM | POA: Diagnosis not present

## 2013-01-06 DIAGNOSIS — E119 Type 2 diabetes mellitus without complications: Secondary | ICD-10-CM

## 2013-01-06 DIAGNOSIS — Z9289 Personal history of other medical treatment: Secondary | ICD-10-CM

## 2013-01-06 DIAGNOSIS — I10 Essential (primary) hypertension: Secondary | ICD-10-CM

## 2013-01-06 LAB — HEPATIC FUNCTION PANEL
ALT: 27 U/L (ref 0–35)
AST: 26 U/L (ref 0–37)
Albumin: 3.7 g/dL (ref 3.5–5.2)
Alkaline Phosphatase: 103 U/L (ref 39–117)
Bilirubin, Direct: 0.2 mg/dL (ref 0.0–0.3)
Total Bilirubin: 0.8 mg/dL (ref 0.3–1.2)
Total Protein: 7.2 g/dL (ref 6.0–8.3)

## 2013-01-06 LAB — BASIC METABOLIC PANEL
BUN: 29 mg/dL — ABNORMAL HIGH (ref 6–23)
CO2: 23 mEq/L (ref 19–32)
Calcium: 9.8 mg/dL (ref 8.4–10.5)
Chloride: 108 mEq/L (ref 96–112)
Creatinine, Ser: 1 mg/dL (ref 0.4–1.2)
GFR: 57.5 mL/min — ABNORMAL LOW (ref >60.00)
Glucose, Bld: 129 mg/dL — ABNORMAL HIGH (ref 70–99)
Potassium: 4.1 mEq/L (ref 3.5–5.1)
Sodium: 139 mEq/L (ref 135–145)

## 2013-01-06 LAB — LIPID PANEL
Cholesterol: 165 mg/dL (ref 0–200)
HDL: 54.9 mg/dL (ref >39.00)
LDL Cholesterol: 95 mg/dL (ref 0–99)
Total CHOL/HDL Ratio: 3
Triglycerides: 76 mg/dL (ref 0.0–149.0)
VLDL: 15.2 mg/dL (ref 0.0–40.0)

## 2013-01-06 LAB — HEPATITIS C ANTIBODY: HCV Ab: NEGATIVE

## 2013-01-06 LAB — HEMOGLOBIN A1C: Hgb A1c MFr Bld: 6.7 % — ABNORMAL HIGH (ref 4.6–6.5)

## 2013-01-06 LAB — HIV ANTIBODY (ROUTINE TESTING W REFLEX): HIV: NONREACTIVE

## 2013-01-06 LAB — TSH: TSH: 1.92 u[IU]/mL (ref 0.35–5.50)

## 2013-01-06 LAB — HEPATITIS B SURFACE ANTIGEN: Hepatitis B Surface Ag: NEGATIVE

## 2013-01-13 ENCOUNTER — Ambulatory Visit (INDEPENDENT_AMBULATORY_CARE_PROVIDER_SITE_OTHER): Payer: Medicare Other | Admitting: Internal Medicine

## 2013-01-13 ENCOUNTER — Encounter: Payer: Self-pay | Admitting: Internal Medicine

## 2013-01-13 VITALS — BP 170/84 | HR 82 | Temp 98.0°F | Resp 16 | Wt 172.0 lb

## 2013-01-13 DIAGNOSIS — M545 Low back pain: Secondary | ICD-10-CM | POA: Diagnosis not present

## 2013-01-13 DIAGNOSIS — E119 Type 2 diabetes mellitus without complications: Secondary | ICD-10-CM

## 2013-01-13 DIAGNOSIS — I10 Essential (primary) hypertension: Secondary | ICD-10-CM | POA: Diagnosis not present

## 2013-01-13 MED ORDER — TRAMADOL HCL 50 MG PO TABS: 50.0000 mg | ORAL_TABLET | Freq: Two times a day (BID) | ORAL | Status: DC | PRN

## 2013-01-13 MED ORDER — LOSARTAN POTASSIUM 100 MG PO TABS: 100.0000 mg | ORAL_TABLET | Freq: Every day | ORAL | Status: DC

## 2013-01-13 NOTE — Progress Notes (Signed)
   Subjective:    HPIGrinding sensation with chewing   F/u LBP x long time irrad to R leg - better. She saw Dr Cleophas Dunker had xrays/CT - spinal stenosis - she was offered spinal injections. C/o hands and feet tingling x months. She started building a new house.  F/u HTN, insomnia BP is nl at home   Review of Systems  Constitutional: Positive for fatigue. Negative for chills, activity change, appetite change and unexpected weight change.  HENT: Negative for congestion, mouth sores and sinus pressure.   Eyes: Negative for visual disturbance.  Respiratory: Negative for chest tightness.   Gastrointestinal: Positive for nausea. Negative for constipation and abdominal distention.  Genitourinary: Negative for frequency, difficulty urinating and vaginal pain.  Musculoskeletal: Negative for back pain and gait problem.  Skin: Negative for pallor.  Neurological: Positive for weakness. Negative for dizziness, tremors and numbness.  Psychiatric/Behavioral: Negative for confusion and sleep disturbance.  B ears WNL TMJs are grinding     Objective:   Physical Exam  Constitutional: She appears well-developed. No distress.  Obese NAD  HENT:  Head: Normocephalic.  Right Ear: External ear normal.  Left Ear: External ear normal.  Nose: Nose normal.  Mouth/Throat: Oropharynx is clear and moist.  Eyes: Conjunctivae are normal. Pupils are equal, round, and reactive to light. Right eye exhibits no discharge. Left eye exhibits no discharge.  Neck: Normal range of motion. Neck supple. No JVD present. No tracheal deviation present. No thyromegaly present.  Cardiovascular: Normal rate, regular rhythm and normal heart sounds.   Pulmonary/Chest: No stridor. No respiratory distress. She has no wheezes.  Abdominal: Soft. Bowel sounds are normal. She exhibits no distension and no mass. There is no tenderness. There is no rebound and no guarding.  Musculoskeletal: She exhibits tenderness (LS is tender). She  exhibits no edema.  Lymphadenopathy:    She has no cervical adenopathy.  Neurological: She displays normal reflexes. No cranial nerve deficit. She exhibits normal muscle tone. Coordination normal.  Skin: No rash noted. No erythema.  Psychiatric: She has a normal mood and affect. Her behavior is normal. Judgment and thought content normal.    Lab Results  Component Value Date   WBC 9.5 09/11/2012   HGB 12.0 09/11/2012   HCT 33.8* 09/11/2012   PLT 201 09/11/2012   GLUCOSE 129* 01/06/2013   CHOL 165 01/06/2013   TRIG 76.0 01/06/2013   HDL 54.90 01/06/2013   LDLDIRECT 133.5 01/28/2007   LDLCALC 95 01/06/2013   ALT 27 01/06/2013   AST 26 01/06/2013   NA 139 01/06/2013   K 4.1 01/06/2013   CL 108 01/06/2013   CREATININE 1.0 01/06/2013   BUN 29* 01/06/2013   CO2 23 01/06/2013   TSH 1.92 01/06/2013   INR 1.02 11/03/2010   HGBA1C 6.7* 01/06/2013         Assessment & Plan:

## 2013-01-13 NOTE — Progress Notes (Signed)
Pre visit review using our clinic review tool, if applicable. No additional management support is needed unless otherwise documented below in the visit note. 

## 2013-01-13 NOTE — Assessment & Plan Note (Signed)
Tramadol prn 

## 2013-01-13 NOTE — Assessment & Plan Note (Signed)
Continue with current prescription therapy as reflected on the Med list.  

## 2013-01-20 ENCOUNTER — Ambulatory Visit: Payer: Medicare Other | Admitting: Neurology

## 2013-02-13 ENCOUNTER — Other Ambulatory Visit: Payer: Self-pay | Admitting: Internal Medicine

## 2013-03-05 DIAGNOSIS — Z961 Presence of intraocular lens: Secondary | ICD-10-CM | POA: Diagnosis not present

## 2013-05-14 ENCOUNTER — Encounter: Payer: Self-pay | Admitting: Internal Medicine

## 2013-05-14 ENCOUNTER — Ambulatory Visit (INDEPENDENT_AMBULATORY_CARE_PROVIDER_SITE_OTHER): Payer: Medicare Other | Admitting: Internal Medicine

## 2013-05-14 VITALS — BP 178/70 | HR 76 | Temp 98.3°F | Resp 16 | Wt 171.0 lb

## 2013-05-14 DIAGNOSIS — R202 Paresthesia of skin: Secondary | ICD-10-CM

## 2013-05-14 DIAGNOSIS — R209 Unspecified disturbances of skin sensation: Secondary | ICD-10-CM

## 2013-05-14 DIAGNOSIS — I10 Essential (primary) hypertension: Secondary | ICD-10-CM

## 2013-05-14 DIAGNOSIS — E119 Type 2 diabetes mellitus without complications: Secondary | ICD-10-CM

## 2013-05-14 MED ORDER — INDAPAMIDE 2.5 MG PO TABS: 2.5000 mg | ORAL_TABLET | Freq: Every day | ORAL | Status: DC

## 2013-05-14 NOTE — Progress Notes (Signed)
   Subjective:    HPI   F/u LBP x long time irrad to R leg - better. She saw Dr Durward Fortes had xrays/CT - spinal stenosis - she was offered spinal injections.   C/o hands and feet tingling x months - resolved off Clinoril and off BP meds F/u HTN, insomnia BP is nl at home   Review of Systems  Constitutional: Positive for fatigue. Negative for chills, activity change, appetite change and unexpected weight change.  HENT: Negative for congestion, mouth sores and sinus pressure.   Eyes: Negative for visual disturbance.  Respiratory: Negative for chest tightness.   Gastrointestinal: Positive for nausea. Negative for constipation and abdominal distention.  Genitourinary: Negative for frequency, difficulty urinating and vaginal pain.  Musculoskeletal: Negative for back pain and gait problem.  Skin: Negative for pallor.  Neurological: Positive for weakness. Negative for dizziness, tremors and numbness.  Psychiatric/Behavioral: Negative for confusion and sleep disturbance.       Objective:   Physical Exam  Constitutional: She appears well-developed. No distress.  Obese NAD  HENT:  Head: Normocephalic.  Right Ear: External ear normal.  Left Ear: External ear normal.  Nose: Nose normal.  Mouth/Throat: Oropharynx is clear and moist.  Eyes: Conjunctivae are normal. Pupils are equal, round, and reactive to light. Right eye exhibits no discharge. Left eye exhibits no discharge.  Neck: Normal range of motion. Neck supple. No JVD present. No tracheal deviation present. No thyromegaly present.  Cardiovascular: Normal rate, regular rhythm and normal heart sounds.   Pulmonary/Chest: No stridor. No respiratory distress. She has no wheezes.  Abdominal: Soft. Bowel sounds are normal. She exhibits no distension and no mass. There is no tenderness. There is no rebound and no guarding.  Musculoskeletal: She exhibits tenderness (LS is tender). She exhibits no edema.  Lymphadenopathy:    She has no  cervical adenopathy.  Neurological: She displays normal reflexes. No cranial nerve deficit. She exhibits normal muscle tone. Coordination normal.  Skin: No rash noted. No erythema.  Psychiatric: She has a normal mood and affect. Her behavior is normal. Judgment and thought content normal.    Lab Results  Component Value Date   WBC 9.5 09/11/2012   HGB 12.0 09/11/2012   HCT 33.8* 09/11/2012   PLT 201 09/11/2012   GLUCOSE 129* 01/06/2013   CHOL 165 01/06/2013   TRIG 76.0 01/06/2013   HDL 54.90 01/06/2013   LDLDIRECT 133.5 01/28/2007   LDLCALC 95 01/06/2013   ALT 27 01/06/2013   AST 26 01/06/2013   NA 139 01/06/2013   K 4.1 01/06/2013   CL 108 01/06/2013   CREATININE 1.0 01/06/2013   BUN 29* 01/06/2013   CO2 23 01/06/2013   TSH 1.92 01/06/2013   INR 1.02 11/03/2010   HGBA1C 6.7* 01/06/2013         Assessment & Plan:

## 2013-05-14 NOTE — Progress Notes (Signed)
Pre visit review using our clinic review tool, if applicable. No additional management support is needed unless otherwise documented below in the visit note. 

## 2013-05-14 NOTE — Assessment & Plan Note (Signed)
Labs

## 2013-05-14 NOTE — Assessment & Plan Note (Signed)
3/15 pt stopped her meds Start Indapamide

## 2013-05-17 DIAGNOSIS — R202 Paresthesia of skin: Secondary | ICD-10-CM | POA: Insufficient documentation

## 2013-05-17 NOTE — Assessment & Plan Note (Signed)
Chronic. Pt attributed sx's to her BP meds and Clinoril: she stopped all and felt better

## 2013-06-05 ENCOUNTER — Telehealth: Payer: Self-pay

## 2013-06-05 NOTE — Telephone Encounter (Signed)
Relevant patient education mailed to patient.  

## 2013-06-19 ENCOUNTER — Emergency Department: Payer: Self-pay | Admitting: Emergency Medicine

## 2013-06-19 DIAGNOSIS — Z86718 Personal history of other venous thrombosis and embolism: Secondary | ICD-10-CM | POA: Diagnosis not present

## 2013-06-19 DIAGNOSIS — Z9889 Other specified postprocedural states: Secondary | ICD-10-CM | POA: Diagnosis not present

## 2013-06-19 DIAGNOSIS — Z888 Allergy status to other drugs, medicaments and biological substances status: Secondary | ICD-10-CM | POA: Diagnosis not present

## 2013-06-19 DIAGNOSIS — S1093XA Contusion of unspecified part of neck, initial encounter: Secondary | ICD-10-CM | POA: Diagnosis not present

## 2013-06-19 DIAGNOSIS — Z9089 Acquired absence of other organs: Secondary | ICD-10-CM | POA: Diagnosis not present

## 2013-06-19 DIAGNOSIS — S0190XA Unspecified open wound of unspecified part of head, initial encounter: Secondary | ICD-10-CM | POA: Diagnosis not present

## 2013-06-19 DIAGNOSIS — Z79899 Other long term (current) drug therapy: Secondary | ICD-10-CM | POA: Diagnosis not present

## 2013-06-19 DIAGNOSIS — S0003XA Contusion of scalp, initial encounter: Secondary | ICD-10-CM | POA: Diagnosis not present

## 2013-06-19 DIAGNOSIS — Z880 Allergy status to penicillin: Secondary | ICD-10-CM | POA: Diagnosis not present

## 2013-07-14 ENCOUNTER — Other Ambulatory Visit (INDEPENDENT_AMBULATORY_CARE_PROVIDER_SITE_OTHER): Payer: Medicare Other

## 2013-07-14 DIAGNOSIS — I10 Essential (primary) hypertension: Secondary | ICD-10-CM | POA: Diagnosis not present

## 2013-07-14 DIAGNOSIS — E119 Type 2 diabetes mellitus without complications: Secondary | ICD-10-CM

## 2013-07-14 DIAGNOSIS — R202 Paresthesia of skin: Secondary | ICD-10-CM

## 2013-07-14 DIAGNOSIS — R209 Unspecified disturbances of skin sensation: Secondary | ICD-10-CM | POA: Diagnosis not present

## 2013-07-14 LAB — VITAMIN B12: Vitamin B-12: 174 pg/mL — ABNORMAL LOW (ref 211–911)

## 2013-07-14 LAB — BASIC METABOLIC PANEL
BUN: 16 mg/dL (ref 6–23)
CO2: 24 mEq/L (ref 19–32)
Calcium: 9.8 mg/dL (ref 8.4–10.5)
Chloride: 107 mEq/L (ref 96–112)
Creatinine, Ser: 0.8 mg/dL (ref 0.4–1.2)
GFR: 72.59 mL/min (ref >60.00)
Glucose, Bld: 115 mg/dL — ABNORMAL HIGH (ref 70–99)
Potassium: 3.6 mEq/L (ref 3.5–5.1)
Sodium: 139 mEq/L (ref 135–145)

## 2013-07-14 LAB — SEDIMENTATION RATE: Sed Rate: 24 mm/hr — ABNORMAL HIGH (ref 0–22)

## 2013-07-14 LAB — HEMOGLOBIN A1C: Hgb A1c MFr Bld: 6.5 % (ref 4.6–6.5)

## 2013-07-16 ENCOUNTER — Ambulatory Visit (INDEPENDENT_AMBULATORY_CARE_PROVIDER_SITE_OTHER): Payer: Medicare Other | Admitting: Internal Medicine

## 2013-07-16 ENCOUNTER — Encounter: Payer: Self-pay | Admitting: Internal Medicine

## 2013-07-16 VITALS — BP 150/80 | HR 72 | Temp 98.2°F | Resp 16 | Wt 172.0 lb

## 2013-07-16 DIAGNOSIS — E538 Deficiency of other specified B group vitamins: Secondary | ICD-10-CM | POA: Diagnosis not present

## 2013-07-16 DIAGNOSIS — R739 Hyperglycemia, unspecified: Secondary | ICD-10-CM

## 2013-07-16 DIAGNOSIS — I10 Essential (primary) hypertension: Secondary | ICD-10-CM | POA: Diagnosis not present

## 2013-07-16 DIAGNOSIS — R7309 Other abnormal glucose: Secondary | ICD-10-CM | POA: Diagnosis not present

## 2013-07-16 DIAGNOSIS — G609 Hereditary and idiopathic neuropathy, unspecified: Secondary | ICD-10-CM | POA: Diagnosis not present

## 2013-07-16 DIAGNOSIS — E119 Type 2 diabetes mellitus without complications: Secondary | ICD-10-CM

## 2013-07-16 MED ORDER — VITAMIN B-12 1000 MCG SL SUBL: 1.0000 | SUBLINGUAL_TABLET | Freq: Every day | SUBLINGUAL | Status: DC

## 2013-07-16 MED ORDER — CYANOCOBALAMIN 1000 MCG/ML IJ SOLN
1000.0000 ug | Freq: Once | INTRAMUSCULAR | Status: AC
  Administered 2013-07-16: 1000 ug via INTRAMUSCULAR

## 2013-07-16 NOTE — Assessment & Plan Note (Signed)
Continue with current prescription therapy as reflected on the Med list.  

## 2013-07-16 NOTE — Assessment & Plan Note (Signed)
Better. Diet controlled

## 2013-07-16 NOTE — Assessment & Plan Note (Addendum)
Now is aggravted by Vit B12 deficiency. Start Vit B12 sl

## 2013-07-16 NOTE — Assessment & Plan Note (Signed)
Start IM and SL Vit B12

## 2013-07-16 NOTE — Progress Notes (Signed)
Pre visit review using our clinic review tool, if applicable. No additional management support is needed unless otherwise documented below in the visit note. 

## 2013-07-16 NOTE — Progress Notes (Signed)
   Subjective:    HPI  C/o dizziness F/u LBP x long time irrad to R leg - better. She saw Dr Durward Fortes had xrays/CT - spinal stenosis - she was offered spinal injections.   C/o hands and feet tingling x months - resolved off Clinoril and off BP meds. It is back now F/u HTN, insomnia BP is nl at home   Review of Systems  Constitutional: Positive for fatigue. Negative for chills, activity change, appetite change and unexpected weight change.  HENT: Negative for congestion, mouth sores and sinus pressure.   Eyes: Negative for visual disturbance.  Respiratory: Negative for chest tightness.   Gastrointestinal: Positive for nausea. Negative for constipation and abdominal distention.  Genitourinary: Negative for frequency, difficulty urinating and vaginal pain.  Musculoskeletal: Negative for back pain and gait problem.  Skin: Negative for pallor.  Neurological: Positive for weakness. Negative for dizziness, tremors and numbness.  Psychiatric/Behavioral: Negative for confusion and sleep disturbance.       Objective:   Physical Exam  Constitutional: She appears well-developed. No distress.  Obese NAD  HENT:  Head: Normocephalic.  Right Ear: External ear normal.  Left Ear: External ear normal.  Nose: Nose normal.  Mouth/Throat: Oropharynx is clear and moist.  Eyes: Conjunctivae are normal. Pupils are equal, round, and reactive to light. Right eye exhibits no discharge. Left eye exhibits no discharge.  Neck: Normal range of motion. Neck supple. No JVD present. No tracheal deviation present. No thyromegaly present.  Cardiovascular: Normal rate, regular rhythm and normal heart sounds.   Pulmonary/Chest: No stridor. No respiratory distress. She has no wheezes.  Abdominal: Soft. Bowel sounds are normal. She exhibits no distension and no mass. There is no tenderness. There is no rebound and no guarding.  Musculoskeletal: She exhibits tenderness (LS is tender). She exhibits no edema.   Lymphadenopathy:    She has no cervical adenopathy.  Neurological: She displays normal reflexes. No cranial nerve deficit. She exhibits normal muscle tone. Coordination normal.  Skin: No rash noted. No erythema.  Psychiatric: She has a normal mood and affect. Her behavior is normal. Judgment and thought content normal.    Lab Results  Component Value Date   WBC 9.5 09/11/2012   HGB 12.0 09/11/2012   HCT 33.8* 09/11/2012   PLT 201 09/11/2012   GLUCOSE 115* 07/14/2013   CHOL 165 01/06/2013   TRIG 76.0 01/06/2013   HDL 54.90 01/06/2013   LDLDIRECT 133.5 01/28/2007   LDLCALC 95 01/06/2013   ALT 27 01/06/2013   AST 26 01/06/2013   NA 139 07/14/2013   K 3.6 07/14/2013   CL 107 07/14/2013   CREATININE 0.8 07/14/2013   BUN 16 07/14/2013   CO2 24 07/14/2013   TSH 1.92 01/06/2013   INR 1.02 11/03/2010   HGBA1C 6.5 07/14/2013         Assessment & Plan:

## 2013-07-21 ENCOUNTER — Ambulatory Visit: Payer: Medicare Other | Admitting: Internal Medicine

## 2013-10-13 ENCOUNTER — Other Ambulatory Visit (INDEPENDENT_AMBULATORY_CARE_PROVIDER_SITE_OTHER): Payer: Medicare Other

## 2013-10-13 DIAGNOSIS — R739 Hyperglycemia, unspecified: Secondary | ICD-10-CM

## 2013-10-13 DIAGNOSIS — R7309 Other abnormal glucose: Secondary | ICD-10-CM | POA: Diagnosis not present

## 2013-10-13 DIAGNOSIS — E538 Deficiency of other specified B group vitamins: Secondary | ICD-10-CM | POA: Diagnosis not present

## 2013-10-13 LAB — BASIC METABOLIC PANEL
BUN: 21 mg/dL (ref 6–23)
CO2: 28 mEq/L (ref 19–32)
Calcium: 9.8 mg/dL (ref 8.4–10.5)
Chloride: 106 mEq/L (ref 96–112)
Creatinine, Ser: 0.8 mg/dL (ref 0.4–1.2)
GFR: 71.51 mL/min (ref >60.00)
Glucose, Bld: 125 mg/dL — ABNORMAL HIGH (ref 70–99)
Potassium: 4.1 mEq/L (ref 3.5–5.1)
Sodium: 141 mEq/L (ref 135–145)

## 2013-10-13 LAB — VITAMIN B12: Vitamin B-12: >1500 pg/mL — ABNORMAL HIGH (ref 211–911)

## 2013-10-13 LAB — HEMOGLOBIN A1C: Hgb A1c MFr Bld: 6.4 % (ref 4.6–6.5)

## 2013-10-20 ENCOUNTER — Encounter: Payer: Self-pay | Admitting: Internal Medicine

## 2013-10-20 ENCOUNTER — Ambulatory Visit (INDEPENDENT_AMBULATORY_CARE_PROVIDER_SITE_OTHER): Payer: Medicare Other | Admitting: Internal Medicine

## 2013-10-20 VITALS — BP 150/84 | HR 72 | Temp 98.6°F | Resp 16 | Wt 170.0 lb

## 2013-10-20 DIAGNOSIS — R609 Edema, unspecified: Secondary | ICD-10-CM | POA: Diagnosis not present

## 2013-10-20 DIAGNOSIS — I725 Aneurysm of other precerebral arteries: Secondary | ICD-10-CM | POA: Insufficient documentation

## 2013-10-20 DIAGNOSIS — E538 Deficiency of other specified B group vitamins: Secondary | ICD-10-CM | POA: Diagnosis not present

## 2013-10-20 DIAGNOSIS — M545 Low back pain, unspecified: Secondary | ICD-10-CM

## 2013-10-20 DIAGNOSIS — I10 Essential (primary) hypertension: Secondary | ICD-10-CM

## 2013-10-20 DIAGNOSIS — M48061 Spinal stenosis, lumbar region without neurogenic claudication: Secondary | ICD-10-CM

## 2013-10-20 DIAGNOSIS — E119 Type 2 diabetes mellitus without complications: Secondary | ICD-10-CM | POA: Diagnosis not present

## 2013-10-20 DIAGNOSIS — R519 Headache, unspecified: Secondary | ICD-10-CM | POA: Insufficient documentation

## 2013-10-20 DIAGNOSIS — I671 Cerebral aneurysm, nonruptured: Secondary | ICD-10-CM

## 2013-10-20 DIAGNOSIS — R51 Headache: Secondary | ICD-10-CM

## 2013-10-20 DIAGNOSIS — M7989 Other specified soft tissue disorders: Secondary | ICD-10-CM

## 2013-10-20 DIAGNOSIS — I498 Other specified cardiac arrhythmias: Secondary | ICD-10-CM

## 2013-10-20 DIAGNOSIS — R001 Bradycardia, unspecified: Secondary | ICD-10-CM | POA: Insufficient documentation

## 2013-10-20 NOTE — Assessment & Plan Note (Signed)
Remote [phone]

## 2013-10-20 NOTE — Assessment & Plan Note (Signed)
Continue with current diet  

## 2013-10-20 NOTE — Progress Notes (Deleted)
Pre visit review using our clinic review tool, if applicable. No additional management support is needed unless otherwise documented below in the visit note. 

## 2013-10-20 NOTE — Assessment & Plan Note (Signed)
9/15 S brady

## 2013-10-20 NOTE — Progress Notes (Signed)
   Subjective:    HPI F/u B12 deficiency  C/o LLE swelling - new  C/o L sided HA off and on, dizziness - h/o brain aneurism  F/u LBP x long time irrad to R leg - better. She saw Dr Durward Fortes had xrays/CT - spinal stenosis - she was offered spinal injections.   F/u hands and feet tingling x months - resolved off Clinoril and off BP meds. It is back now F/u HTN, insomnia BP is nl at home  BP Readings from Last 3 Encounters:  10/20/13 150/84  07/16/13 150/80  05/14/13 178/70   Wt Readings from Last 3 Encounters:  10/20/13 170 lb (77.111 kg)  07/16/13 172 lb (78.019 kg)  05/14/13 171 lb (77.565 kg)      Review of Systems  Constitutional: Positive for fatigue. Negative for chills, activity change, appetite change and unexpected weight change.  HENT: Negative for congestion, mouth sores and sinus pressure.   Eyes: Negative for visual disturbance.  Respiratory: Negative for chest tightness.   Gastrointestinal: Positive for nausea. Negative for constipation and abdominal distention.  Genitourinary: Negative for frequency, difficulty urinating and vaginal pain.  Musculoskeletal: Negative for back pain and gait problem.  Skin: Negative for pallor.  Neurological: Positive for weakness. Negative for dizziness, tremors and numbness.  Psychiatric/Behavioral: Negative for confusion and sleep disturbance.       Objective:   Physical Exam  Constitutional: She appears well-developed. No distress.  Obese NAD  HENT:  Head: Normocephalic.  Right Ear: External ear normal.  Left Ear: External ear normal.  Nose: Nose normal.  Mouth/Throat: Oropharynx is clear and moist.  Eyes: Conjunctivae are normal. Pupils are equal, round, and reactive to light. Right eye exhibits no discharge. Left eye exhibits no discharge.  Neck: Normal range of motion. Neck supple. No JVD present. No tracheal deviation present. No thyromegaly present.  Cardiovascular: Normal rate, regular rhythm and normal  heart sounds.   Pulmonary/Chest: No stridor. No respiratory distress. She has no wheezes.  Abdominal: Soft. Bowel sounds are normal. She exhibits no distension and no mass. There is no tenderness. There is no rebound and no guarding.  Musculoskeletal: She exhibits tenderness (LS is tender). She exhibits no edema.  Lymphadenopathy:    She has no cervical adenopathy.  Neurological: She displays normal reflexes. No cranial nerve deficit. She exhibits normal muscle tone. Coordination normal.  Skin: No rash noted. No erythema.  Psychiatric: She has a normal mood and affect. Her behavior is normal. Judgment and thought content normal.  L calf w/swelling, NT Bradycardic  Lab Results  Component Value Date   WBC 9.5 09/11/2012   HGB 12.0 09/11/2012   HCT 33.8* 09/11/2012   PLT 201 09/11/2012   GLUCOSE 125* 10/13/2013   CHOL 165 01/06/2013   TRIG 76.0 01/06/2013   HDL 54.90 01/06/2013   LDLDIRECT 133.5 01/28/2007   LDLCALC 95 01/06/2013   ALT 27 01/06/2013   AST 26 01/06/2013   NA 141 10/13/2013   K 4.1 10/13/2013   CL 106 10/13/2013   CREATININE 0.8 10/13/2013   BUN 21 10/13/2013   CO2 28 10/13/2013   TSH 1.92 01/06/2013   INR 1.02 11/03/2010   HGBA1C 6.4 10/13/2013         Assessment & Plan:

## 2013-10-20 NOTE — Assessment & Plan Note (Signed)
9/15 x 2-3 mo ?etiology Doppler US

## 2013-10-20 NOTE — Assessment & Plan Note (Addendum)
Continue with current po B12 therapy as reflected on the Med list. GI consult - pt refused RTC 6 mo per pt's request (not in 3 mo)

## 2013-10-20 NOTE — Assessment & Plan Note (Signed)
2015 LLE: L ankle x 3 mo

## 2013-10-20 NOTE — Assessment & Plan Note (Signed)
Chronic - OA Spinal stenosis  Potential benefits of a long term opioids use as well as potential risks (i.e. addiction risk, apnea etc) and complications (i.e. Somnolence, constipation and others) were explained to the patient and were aknowledged.  Continue with current prescription therapy as reflected on the Med list.

## 2013-10-20 NOTE — Assessment & Plan Note (Signed)
Spinal stenosis Potential benefits of a long term opioids use as well as potential risks (i.e. addiction risk, apnea etc) and complications (i.e. Somnolence, constipation and others) were explained to the patient and were aknowledged.  

## 2013-10-20 NOTE — Assessment & Plan Note (Signed)
Nl BP at home  Chronic 3/15 pt stopped her meds

## 2013-10-20 NOTE — Assessment & Plan Note (Signed)
L sided since winter 2014 comes and goes

## 2013-10-29 ENCOUNTER — Telehealth: Payer: Self-pay | Admitting: Internal Medicine

## 2013-10-29 NOTE — Telephone Encounter (Signed)
Spoke w/Andrea @ [facility]. Waiting on call back from [facility] for more information.

## 2013-10-29 NOTE — Telephone Encounter (Signed)
Patient states [facility] is requesting a call back (contact Seth Bake) in regards to referral .

## 2013-11-09 ENCOUNTER — Telehealth: Payer: Self-pay | Admitting: Neurology

## 2013-11-09 NOTE — Telephone Encounter (Signed)
Pt cancelled appt to see you in oct and did not resch

## 2013-11-10 ENCOUNTER — Encounter (HOSPITAL_COMMUNITY): Payer: Medicare Other

## 2013-11-12 DIAGNOSIS — I82409 Acute embolism and thrombosis of unspecified deep veins of unspecified lower extremity: Secondary | ICD-10-CM

## 2013-11-12 HISTORY — DX: Acute embolism and thrombosis of unspecified deep veins of unspecified lower extremity: I82.409

## 2013-11-18 ENCOUNTER — Ambulatory Visit (HOSPITAL_COMMUNITY): Payer: Medicare Other | Attending: Internal Medicine | Admitting: Cardiology

## 2013-11-18 DIAGNOSIS — Z87891 Personal history of nicotine dependence: Secondary | ICD-10-CM | POA: Insufficient documentation

## 2013-11-18 DIAGNOSIS — E119 Type 2 diabetes mellitus without complications: Secondary | ICD-10-CM | POA: Diagnosis not present

## 2013-11-18 DIAGNOSIS — I10 Essential (primary) hypertension: Secondary | ICD-10-CM | POA: Diagnosis not present

## 2013-11-18 DIAGNOSIS — M7989 Other specified soft tissue disorders: Secondary | ICD-10-CM | POA: Diagnosis not present

## 2013-11-18 DIAGNOSIS — M79606 Pain in leg, unspecified: Secondary | ICD-10-CM | POA: Diagnosis not present

## 2013-11-18 DIAGNOSIS — I80202 Phlebitis and thrombophlebitis of unspecified deep vessels of left lower extremity: Secondary | ICD-10-CM

## 2013-11-18 DIAGNOSIS — R609 Edema, unspecified: Secondary | ICD-10-CM

## 2013-11-18 NOTE — Progress Notes (Signed)
Bilateral lower venous duplex performed.  Results given to Dr.Plotnikov, patient informed she can leave and will receive call from Dr.Plotnikov today.

## 2013-11-18 NOTE — Telephone Encounter (Signed)
Kristina Wilkinson, please, inform patient that she has a blood clot in the left leg (per [place]). Pls ask her to come for an OV tomorrow at 1 pm (work in) to discuss treatment.  Thx

## 2013-11-19 ENCOUNTER — Ambulatory Visit (INDEPENDENT_AMBULATORY_CARE_PROVIDER_SITE_OTHER): Payer: Medicare Other | Admitting: Internal Medicine

## 2013-11-19 ENCOUNTER — Encounter: Payer: Self-pay | Admitting: Internal Medicine

## 2013-11-19 VITALS — BP 170/102 | HR 72 | Temp 98.4°F | Resp 16 | Wt 170.0 lb

## 2013-11-19 DIAGNOSIS — R609 Edema, unspecified: Secondary | ICD-10-CM | POA: Diagnosis not present

## 2013-11-19 DIAGNOSIS — I10 Essential (primary) hypertension: Secondary | ICD-10-CM

## 2013-11-19 DIAGNOSIS — E538 Deficiency of other specified B group vitamins: Secondary | ICD-10-CM

## 2013-11-19 DIAGNOSIS — M4807 Spinal stenosis, lumbosacral region: Secondary | ICD-10-CM

## 2013-11-19 DIAGNOSIS — I82409 Acute embolism and thrombosis of unspecified deep veins of unspecified lower extremity: Secondary | ICD-10-CM | POA: Insufficient documentation

## 2013-11-19 DIAGNOSIS — M79606 Pain in leg, unspecified: Secondary | ICD-10-CM

## 2013-11-19 DIAGNOSIS — I82402 Acute embolism and thrombosis of unspecified deep veins of left lower extremity: Secondary | ICD-10-CM | POA: Diagnosis not present

## 2013-11-19 DIAGNOSIS — E559 Vitamin D deficiency, unspecified: Secondary | ICD-10-CM

## 2013-11-19 DIAGNOSIS — R202 Paresthesia of skin: Secondary | ICD-10-CM

## 2013-11-19 MED ORDER — ASPIRIN EC 81 MG PO TBEC: 162.0000 mg | DELAYED_RELEASE_TABLET | Freq: Every day | ORAL | Status: DC

## 2013-11-19 NOTE — Telephone Encounter (Signed)
Pt informed and agreed to OV today at 1 pm.

## 2013-11-19 NOTE — Assessment & Plan Note (Signed)
10/15 L DVT x3 mo post-fall- likely due to leg injury Pt declined anticoagulation, vena cava filter, Neurology or Hematology consult ( due to her h/o brain aneurism) She agreed to take 2 baby ASA a day and elevate her leg. Pt declined using compression socks as well. Risks associated with lack of standard DVT treatment compliance were discussed

## 2013-11-19 NOTE — Patient Instructions (Addendum)
Elevate left leg 15 min 4 times a day  Deep Vein Thrombosis A deep vein thrombosis (DVT) is a blood clot that develops in the deep, larger veins of the leg, arm, or pelvis. These are more dangerous than clots that might form in veins near the surface of the body. A DVT can lead to serious and even life-threatening complications if the clot breaks off and travels in the bloodstream to the lungs.  A DVT can damage the valves in your leg veins so that instead of flowing upward, the blood pools in the lower leg. This is called post-thrombotic syndrome, and it can result in pain, swelling, discoloration, and sores on the leg. CAUSES Usually, several things contribute to the formation of blood clots. Contributing factors include:  The flow of blood slows down.  The inside of the vein is damaged in some way.  You have a condition that makes blood clot more easily. RISK FACTORS Some people are more likely than others to develop blood clots. Risk factors include:   Smoking.  Being overweight (obese).  Sitting or lying still for a long time. This includes long-distance travel, paralysis, or recovery from an illness or surgery. Other factors that increase risk are:   Older age, especially over 18 years of age.  Having a family history of blood clots or if you have already had a blot clot.  Having major or lengthy surgery. This is especially true for surgery on the hip, knee, or belly (abdomen). Hip surgery is particularly high risk.  Having a long, thin tube (catheter) placed inside a vein during a medical procedure.  Breaking a hip or leg.  Having cancer or cancer treatment.  Pregnancy and childbirth.  Hormone changes make the blood clot more easily during pregnancy.  The fetus puts pressure on the veins of the pelvis.  There is a risk of injury to veins during delivery or a caesarean delivery. The risk is highest just after childbirth.  Medicines containing the female hormone  estrogen. This includes birth control pills and hormone replacement therapy.  Other circulation or heart problems.  SIGNS AND SYMPTOMS When a clot forms, it can either partially or totally block the blood flow in that vein. Symptoms of a DVT can include:  Swelling of the leg or arm, especially if one side is much worse.  Warmth and redness of the leg or arm, especially if one side is much worse.  Pain in an arm or leg. If the clot is in the leg, symptoms may be more noticeable or worse when standing or walking. The symptoms of a DVT that has traveled to the lungs (pulmonary embolism, PE) usually start suddenly and include:  Shortness of breath.  Coughing.  Coughing up blood or blood-tinged mucus.  Chest pain. The chest pain is often worse with deep breaths.  Rapid heartbeat. Anyone with these symptoms should get emergency medical treatment right away. Do not wait to see if the symptoms will go away. Call your local emergency services (911 in the [place] if you have these symptoms. Do not drive yourself to the hospital. DIAGNOSIS If a DVT is suspected, your health care provider will take a full medical history and perform a physical exam. Tests that also may be required include:  Blood tests, including studies of the clotting properties of the blood.  Ultrasound to see if you have clots in your legs or lungs.  X-rays to show the flow of blood when dye is injected into the veins (  venogram).  Studies of your lungs if you have any chest symptoms. PREVENTION  Exercise the legs regularly. Take a brisk 30-minute walk every day.  Maintain a weight that is appropriate for your height.  Avoid sitting or lying in bed for long periods of time without moving your legs.  Women, particularly those over the age of 15 years, should consider the risks and benefits of taking estrogen medicines, including birth control pills.  Do not smoke, especially if you take estrogen  medicines.  Long-distance travel can increase your risk of DVT. You should exercise your legs by walking or pumping the muscles every hour.  Many of the risk factors above relate to situations that exist with hospitalization, either for illness, injury, or elective surgery. Prevention may include medical and nonmedical measures.  Your health care provider will assess you for the need for venous thromboembolism prevention when you are admitted to the hospital. If you are having surgery, your surgeon will assess you the day of or day after surgery. TREATMENT Once identified, a DVT can be treated. It can also be prevented in some circumstances. Once you have had a DVT, you may be at increased risk for a DVT in the future. The most common treatment for DVT is blood-thinning (anticoagulant) medicine, which reduces the blood's tendency to clot. Anticoagulants can stop new blood clots from forming and stop old clots from growing. They cannot dissolve existing clots. Your body does this by itself over time. Anticoagulants can be given by mouth, through an IV tube, or by injection. Your health care provider will determine the best program for you. Other medicines or treatments that may be used are:  Heparin or related medicines (low molecular weight heparin) are often the first treatment for a blood clot. They act quickly. However, they cannot be taken orally and must be given either in shot form or by IV tube.  Heparin can cause a fall in a component of blood that stops bleeding and forms blood clots (platelets). You will be monitored with blood tests to be sure this does not occur.  Warfarin is an anticoagulant that can be swallowed. It takes a few days to start working, so usually heparin or related medicines are used in combination. Once warfarin is working, heparin is usually stopped.  Factor Xa inhibitor medicines, such as rivaroxaban and apixaban, also reduce blood clotting. These medicines are taken  orally and can often be used without heparin or related medicines.  Less commonly, clot dissolving drugs (thrombolytics) are used to dissolve a DVT. They carry a high risk of bleeding, so they are used mainly in severe cases where your life or a part of your body is threatened.  Very rarely, a blood clot in the leg needs to be removed surgically.  If you are unable to take anticoagulants, your health care provider may arrange for you to have a filter placed in a main vein in your abdomen. This filter prevents clots from traveling to your lungs. HOME CARE INSTRUCTIONS  Take all medicines as directed by your health care provider.  Learn as much as you can about DVT.  Wear a medical alert bracelet or carry a medical alert card.  Ask your health care provider how soon you can go back to normal activities. It is important to stay active to prevent blood clots. If you are on anticoagulant medicine, avoid contact sports.  It is very important to exercise. This is especially important while traveling, sitting, or standing for long  periods of time. Exercise your legs by walking or by tightening and relaxing your leg muscles regularly. Take frequent walks.  You may need to wear compression stockings. These are tight elastic stockings that apply pressure to the lower legs. This pressure can help keep the blood in the legs from clotting. Taking Warfarin Warfarin is a daily medicine that is taken by mouth. Your health care provider will advise you on the length of treatment (usually 3-6 months, sometimes lifelong). If you take warfarin:  Understand how to take warfarin and foods that can affect how warfarin works in Veterinary surgeon.  Too much and too little warfarin are both dangerous. Too much warfarin increases the risk of bleeding. Too little warfarin continues to allow the risk for blood clots. Warfarin and Regular Blood Testing While taking warfarin, you will need to have regular blood tests to measure  your blood clotting time. These blood tests usually include both the prothrombin time (PT) and international normalized ratio (INR) tests. The PT and INR results allow your health care provider to adjust your dose of warfarin. It is very important that you have your PT and INR tested as often as directed by your health care provider.  Warfarin and Your Diet Avoid major changes in your diet, or notify your health care provider before changing your diet. Arrange a visit with a registered dietitian to answer your questions. Many foods, especially foods high in vitamin K, can interfere with warfarin and affect the PT and INR results. You should eat a consistent amount of foods high in vitamin K. Foods high in vitamin K include:   Spinach, kale, broccoli, cabbage, collard and turnip greens, Brussels sprouts, peas, cauliflower, seaweed, and parsley.  Beef and pork liver.  Green tea.  Soybean oil. Warfarin with Other Medicines Many medicines can interfere with warfarin and affect the PT and INR results. You must:  Tell your health care provider about any and all medicines, vitamins, and supplements you take, including aspirin and other over-the-counter anti-inflammatory medicines. Be especially cautious with aspirin and anti-inflammatory medicines. Ask your health care provider before taking these.  Do not take or discontinue any prescribed or over-the-counter medicine except on the advice of your health care provider or pharmacist. Warfarin Side Effects Warfarin can have side effects, such as easy bruising and difficulty stopping bleeding. Ask your health care provider or pharmacist about other side effects of warfarin. You will need to:  Hold pressure over cuts for longer than usual.  Notify your dentist and other health care providers that you are taking warfarin before you undergo any procedures where bleeding may occur. Warfarin with Alcohol and Tobacco   Drinking alcohol frequently can  increase the effect of warfarin, leading to excess bleeding. It is best to avoid alcoholic drinks or to consume only very small amounts while taking warfarin. Notify your health care provider if you change your alcohol intake.   Do not use any tobacco products including cigarettes, chewing tobacco, or electronic cigarettes. If you smoke, quit. Ask your health care provider for help with quitting smoking. Alternative Medicines to Warfarin: Factor Xa Inhibitor Medicines  These blood-thinning medicines are taken by mouth, usually for several weeks or longer. It is important to take the medicine every single day at the same time each day.  There are no regular blood tests required when using these medicines.  There are fewer food and drug interactions than with warfarin.  The side effects of this class of medicine are similar to  those of warfarin, including excessive bruising or bleeding. Ask your health care provider or pharmacist about other potential side effects. SEEK MEDICAL CARE IF:  You notice a rapid heartbeat.  You feel weaker or more tired than usual.  You feel faint.  You notice increased bruising.  You feel your symptoms are not getting better in the time expected.  You believe you are having side effects of medicine. SEEK IMMEDIATE MEDICAL CARE IF:  You have chest pain.  You have trouble breathing.  You have new or increased swelling or pain in one leg.  You cough up blood.  You notice blood in vomit, in a bowel movement, or in urine. MAKE SURE YOU:  Understand these instructions.  Will watch your condition.  Will get help right away if you are not doing well or get worse. Document Released: 01/29/2005 Document Revised: 06/15/2013 Document Reviewed: 10/06/2012 [facility] Patient Information 2015 [place], [place]. This information is not intended to replace advice given to you by your health care provider. Make sure you discuss any questions you have with your health  care provider.

## 2013-11-19 NOTE — Assessment & Plan Note (Signed)
Chronic 3/15 pt stopped her meds Pt is refusing to take BP meds

## 2013-11-19 NOTE — Assessment & Plan Note (Addendum)
10/15 L x3 mo post-fall- likely due to leg injury Pt declined anticoagulation, vena cava filter, Neurology or Hematology consult ( due to her h/o brain aneurism) She agreed to take 2 baby ASA a day and elevate her leg. Pt declined using compression socks as well. Risks associated with lack of standard DVT treatment compliance were discussed

## 2013-11-19 NOTE — Assessment & Plan Note (Signed)
Neuropathy Pt missed her Neurology appt. She said she will go to [facility] for a consultation

## 2013-11-19 NOTE — Assessment & Plan Note (Signed)
Continue with current  therapy as reflected on the Med list.  

## 2013-11-19 NOTE — Assessment & Plan Note (Signed)
  Pt missed her Neurology appt. She said she will go to [facility] for a consultation

## 2013-11-19 NOTE — Assessment & Plan Note (Signed)
Neuropathy/ spinal stenosis Pt missed her Neurology appt. She said she will go to [facility] for a consultation

## 2013-11-19 NOTE — Progress Notes (Signed)
Pre visit review using our clinic review tool, if applicable. No additional management support is needed unless otherwise documented below in the visit note. 

## 2013-11-19 NOTE — Progress Notes (Signed)
   Subjective:    HPI  F/u abn doppler w/LLE DVT - dist-to-proximal  F/u B12 deficiency  C/o L sided HA off and on, dizziness - h/o brain aneurism  F/u LBP x long time irrad to R leg - better. She saw Dr Durward Fortes had xrays/CT - spinal stenosis - she was offered spinal injections.   F/u hands and feet tingling x months - resolved off Clinoril and off BP meds. It is back now F/u HTN, insomnia BP is nl at home  BP Readings from Last 3 Encounters:  11/19/13 170/102  10/20/13 150/84  07/16/13 150/80   Wt Readings from Last 3 Encounters:  11/19/13 170 lb (77.111 kg)  10/20/13 170 lb (77.111 kg)  07/16/13 172 lb (78.019 kg)      Review of Systems  Constitutional: Positive for fatigue. Negative for chills, activity change, appetite change and unexpected weight change.  HENT: Negative for congestion, mouth sores and sinus pressure.   Eyes: Negative for visual disturbance.  Respiratory: Negative for chest tightness.   Gastrointestinal: Positive for nausea. Negative for constipation and abdominal distention.  Genitourinary: Negative for frequency, difficulty urinating and vaginal pain.  Musculoskeletal: Negative for back pain and gait problem.  Skin: Negative for pallor.  Neurological: Positive for weakness. Negative for dizziness, tremors and numbness.  Psychiatric/Behavioral: Negative for confusion and sleep disturbance.       Objective:   Physical Exam  Constitutional: She appears well-developed. No distress.  Obese NAD  HENT:  Head: Normocephalic.  Right Ear: External ear normal.  Left Ear: External ear normal.  Nose: Nose normal.  Mouth/Throat: Oropharynx is clear and moist.  Eyes: Conjunctivae are normal. Pupils are equal, round, and reactive to light. Right eye exhibits no discharge. Left eye exhibits no discharge.  Neck: Normal range of motion. Neck supple. No JVD present. No tracheal deviation present. No thyromegaly present.  Cardiovascular: Normal rate,  regular rhythm and normal heart sounds.   Pulmonary/Chest: No stridor. No respiratory distress. She has no wheezes.  Abdominal: Soft. Bowel sounds are normal. She exhibits no distension and no mass. There is no tenderness. There is no rebound and no guarding.  Musculoskeletal: She exhibits tenderness (LS is tender). She exhibits no edema.  Lymphadenopathy:    She has no cervical adenopathy.  Neurological: She displays normal reflexes. No cranial nerve deficit. She exhibits normal muscle tone. Coordination normal.  Skin: No rash noted. No erythema.  Psychiatric: She has a normal mood and affect. Her behavior is normal. Judgment and thought content normal.  L calf w/swelling, NT - less swollen Bradycardic  Lab Results  Component Value Date   WBC 9.5 09/11/2012   HGB 12.0 09/11/2012   HCT 33.8* 09/11/2012   PLT 201 09/11/2012   GLUCOSE 125* 10/13/2013   CHOL 165 01/06/2013   TRIG 76.0 01/06/2013   HDL 54.90 01/06/2013   LDLDIRECT 133.5 01/28/2007   LDLCALC 95 01/06/2013   ALT 27 01/06/2013   AST 26 01/06/2013   NA 141 10/13/2013   K 4.1 10/13/2013   CL 106 10/13/2013   CREATININE 0.8 10/13/2013   BUN 21 10/13/2013   CO2 28 10/13/2013   TSH 1.92 01/06/2013   INR 1.02 11/03/2010   HGBA1C 6.4 10/13/2013    Prelim Doppler  A complex case     Assessment & Plan:

## 2013-11-19 NOTE — Assessment & Plan Note (Signed)
Continue with current B12 therapy as reflected on the Med list.

## 2013-11-20 ENCOUNTER — Telehealth: Payer: Self-pay | Admitting: Internal Medicine

## 2013-11-20 NOTE — Telephone Encounter (Signed)
emmi mailed  °

## 2013-11-26 ENCOUNTER — Ambulatory Visit: Payer: Medicare Other | Admitting: Neurology

## 2013-12-21 ENCOUNTER — Encounter: Payer: Self-pay | Admitting: Internal Medicine

## 2013-12-21 ENCOUNTER — Ambulatory Visit (INDEPENDENT_AMBULATORY_CARE_PROVIDER_SITE_OTHER): Payer: Medicare Other | Admitting: Internal Medicine

## 2013-12-21 VITALS — BP 178/88 | HR 79 | Temp 98.4°F | Wt 171.0 lb

## 2013-12-21 DIAGNOSIS — E559 Vitamin D deficiency, unspecified: Secondary | ICD-10-CM | POA: Diagnosis not present

## 2013-12-21 DIAGNOSIS — I10 Essential (primary) hypertension: Secondary | ICD-10-CM

## 2013-12-21 DIAGNOSIS — I82402 Acute embolism and thrombosis of unspecified deep veins of left lower extremity: Secondary | ICD-10-CM | POA: Diagnosis not present

## 2013-12-21 DIAGNOSIS — E538 Deficiency of other specified B group vitamins: Secondary | ICD-10-CM

## 2013-12-21 NOTE — Assessment & Plan Note (Signed)
Nl BP at home per pt Chronic 3/15 pt stopped her meds Pt is refusing to take BP meds

## 2013-12-21 NOTE — Progress Notes (Signed)
Patient ID: Kristina Wilkinson, female   DOB: 04-Jul-1927, 80 y.o.   MRN: [id number]   Subjective:    HPI  F/u abn doppler w/LLE DVT - dist-to-proximal - on ASA only  F/u B12 deficiency  F/u L sided HA off and on, dizziness - h/o brain aneurism  F/u LBP x long time irrad to R leg - better. She saw Dr Durward Fortes had xrays/CT - spinal stenosis - she was offered spinal injections.   F/u hands and feet tingling x months - resolved off Clinoril and off BP meds. It is back now F/u HTN, insomnia BP is nl at home  BP Readings from Last 3 Encounters:  12/21/13 178/88  11/19/13 170/102  10/20/13 150/84   Wt Readings from Last 3 Encounters:  12/21/13 171 lb (77.565 kg)  11/19/13 170 lb (77.111 kg)  10/20/13 170 lb (77.111 kg)      Review of Systems  Constitutional: Positive for fatigue. Negative for chills, activity change, appetite change and unexpected weight change.  HENT: Negative for congestion, mouth sores and sinus pressure.   Eyes: Negative for visual disturbance.  Respiratory: Negative for chest tightness.   Gastrointestinal: Positive for nausea. Negative for constipation and abdominal distention.  Genitourinary: Negative for frequency, difficulty urinating and vaginal pain.  Musculoskeletal: Negative for back pain and gait problem.  Skin: Negative for pallor.  Neurological: Positive for weakness. Negative for dizziness, tremors and numbness.  Psychiatric/Behavioral: Negative for confusion and sleep disturbance.       Objective:   Physical Exam  Constitutional: She appears well-developed. No distress.  HENT:  Head: Normocephalic.  Right Ear: External ear normal.  Left Ear: External ear normal.  Nose: Nose normal.  Mouth/Throat: Oropharynx is clear and moist.  Eyes: Conjunctivae are normal. Pupils are equal, round, and reactive to light. Right eye exhibits no discharge. Left eye exhibits no discharge.  Neck: Normal range of motion. Neck supple. No JVD present. No tracheal  deviation present. No thyromegaly present.  Cardiovascular: Normal rate, regular rhythm and normal heart sounds.   Pulmonary/Chest: No stridor. No respiratory distress. She has no wheezes.  Abdominal: Soft. Bowel sounds are normal. She exhibits no distension and no mass. There is no tenderness. There is no rebound and no guarding.  Musculoskeletal: She exhibits no edema or tenderness.  Lymphadenopathy:    She has no cervical adenopathy.  Neurological: She displays normal reflexes. No cranial nerve deficit. She exhibits normal muscle tone. Coordination normal.  Skin: No rash noted. No erythema.  Psychiatric: She has a normal mood and affect. Her behavior is normal. Judgment and thought content normal.  L calf w/less swelling, NT - less swollen; NT Bradycardic  Lab Results  Component Value Date   WBC 9.5 09/11/2012   HGB 12.0 09/11/2012   HCT 33.8* 09/11/2012   PLT 201 09/11/2012   GLUCOSE 125* 10/13/2013   CHOL 165 01/06/2013   TRIG 76.0 01/06/2013   HDL 54.90 01/06/2013   LDLDIRECT 133.5 01/28/2007   LDLCALC 95 01/06/2013   ALT 27 01/06/2013   AST 26 01/06/2013   NA 141 10/13/2013   K 4.1 10/13/2013   CL 106 10/13/2013   CREATININE 0.8 10/13/2013   BUN 21 10/13/2013   CO2 28 10/13/2013   TSH 1.92 01/06/2013   INR 1.02 11/03/2010   HGBA1C 6.4 10/13/2013         Assessment & Plan:

## 2013-12-21 NOTE — Progress Notes (Signed)
Pre visit review using our clinic review tool, if applicable. No additional management support is needed unless otherwise documented below in the visit note. 

## 2013-12-21 NOTE — Assessment & Plan Note (Signed)
10/15 L x3 mo post-fall - likely due to leg injury Pt declined anticoagulation, vena cava filter, Neurology or Hematology consult ( due to her h/o brain aneurism) She agreed to take 2 baby ASA a day and elevate her leg. Pt declined using compression socks. Risks associated with lack of standard DVT treatment compliance were discussed  Seems to be getting better: discussed

## 2013-12-21 NOTE — Assessment & Plan Note (Signed)
On SL med

## 2013-12-21 NOTE — Assessment & Plan Note (Signed)
Off vit D - "made me sick"

## 2014-04-13 ENCOUNTER — Other Ambulatory Visit: Payer: Medicare Other

## 2014-04-20 ENCOUNTER — Ambulatory Visit (INDEPENDENT_AMBULATORY_CARE_PROVIDER_SITE_OTHER): Payer: Medicare Other | Admitting: Internal Medicine

## 2014-04-20 ENCOUNTER — Encounter: Payer: Self-pay | Admitting: Internal Medicine

## 2014-04-20 ENCOUNTER — Ambulatory Visit (INDEPENDENT_AMBULATORY_CARE_PROVIDER_SITE_OTHER)
Admission: RE | Admit: 2014-04-20 | Discharge: 2014-04-20 | Disposition: A | Discharge status: Home / Self Care | Payer: Medicare Other | Source: Ambulatory Visit | Attending: Internal Medicine | Admitting: Internal Medicine

## 2014-04-20 VITALS — BP 160/86 | HR 66 | Temp 98.3°F | Wt 170.0 lb

## 2014-04-20 DIAGNOSIS — M5441 Lumbago with sciatica, right side: Secondary | ICD-10-CM | POA: Diagnosis not present

## 2014-04-20 DIAGNOSIS — M48061 Spinal stenosis, lumbar region without neurogenic claudication: Secondary | ICD-10-CM

## 2014-04-20 DIAGNOSIS — M4806 Spinal stenosis, lumbar region: Secondary | ICD-10-CM

## 2014-04-20 DIAGNOSIS — E538 Deficiency of other specified B group vitamins: Secondary | ICD-10-CM

## 2014-04-20 DIAGNOSIS — M25572 Pain in left ankle and joints of left foot: Secondary | ICD-10-CM | POA: Diagnosis not present

## 2014-04-20 DIAGNOSIS — M19072 Primary osteoarthritis, left ankle and foot: Secondary | ICD-10-CM | POA: Diagnosis not present

## 2014-04-20 DIAGNOSIS — E1165 Type 2 diabetes mellitus with hyperglycemia: Secondary | ICD-10-CM

## 2014-04-20 DIAGNOSIS — M25579 Pain in unspecified ankle and joints of unspecified foot: Secondary | ICD-10-CM | POA: Insufficient documentation

## 2014-04-20 MED ORDER — PREDNISONE 10 MG PO TABS
ORAL_TABLET | ORAL | Status: DC
Start: 2014-04-20 — End: 2014-12-16

## 2014-04-20 NOTE — Assessment & Plan Note (Signed)
Labs

## 2014-04-20 NOTE — Assessment & Plan Note (Signed)
Chronic - OA Spinal stenosis  Prednisone 10 mg: take 4 tabs a day x 3 days; then 3 tabs a day x 4 days; then 2 tabs a day x 4 days, then 1 tab a day x 6 days, then stop. Take pc.   Pt is home bound. [place]

## 2014-04-20 NOTE — Progress Notes (Signed)
Pre visit review using our clinic review tool, if applicable. No additional management support is needed unless otherwise documented below in the visit note. 

## 2014-04-20 NOTE — Progress Notes (Signed)
   Subjective:    HPI  C/o unsteady gait, dizziness. C/o  LBP irrad to R leg x 1 mo - can't walk  F/u LLE DVT - dist-to-proximal - on ASA only  F/u B12 deficiency  F/u L sided HA off and on, dizziness - h/o brain aneurism  F/u LBP x long time irrad to R leg - better. She saw Dr Durward Fortes had xrays/CT - spinal stenosis - she was offered spinal injections.   F/u hands and feet tingling x months - resolved off Clinoril and off BP meds. It is back now  F/u HTN, insomnia  BP is nl at home  BP Readings from Last 3 Encounters:  04/20/14 160/86  12/21/13 178/88  11/19/13 170/102   Wt Readings from Last 3 Encounters:  04/20/14 170 lb (77.111 kg)  12/21/13 171 lb (77.565 kg)  11/19/13 170 lb (77.111 kg)      Review of Systems  Constitutional: Positive for fatigue. Negative for chills, activity change, appetite change and unexpected weight change.  HENT: Negative for congestion, mouth sores and sinus pressure.   Eyes: Negative for visual disturbance.  Respiratory: Negative for chest tightness.   Gastrointestinal: Positive for nausea. Negative for constipation and abdominal distention.  Genitourinary: Negative for frequency, difficulty urinating and vaginal pain.  Musculoskeletal: Negative for back pain and gait problem.  Skin: Negative for pallor.  Neurological: Positive for weakness. Negative for dizziness, tremors and numbness.  Psychiatric/Behavioral: Negative for confusion and sleep disturbance.       Objective:   Physical Exam  Constitutional: She appears well-developed. No distress.  HENT:  Head: Normocephalic.  Right Ear: External ear normal.  Left Ear: External ear normal.  Nose: Nose normal.  Mouth/Throat: Oropharynx is clear and moist.  Eyes: Conjunctivae are normal. Pupils are equal, round, and reactive to light. Right eye exhibits no discharge. Left eye exhibits no discharge.  Neck: Normal range of motion. Neck supple. No JVD present. No tracheal deviation  present. No thyromegaly present.  Cardiovascular: Normal rate, regular rhythm and normal heart sounds.   Pulmonary/Chest: No stridor. No respiratory distress. She has no wheezes.  Abdominal: Soft. Bowel sounds are normal. She exhibits no distension and no mass. There is no tenderness. There is no rebound and no guarding.  Musculoskeletal: She exhibits no edema or tenderness.  Lymphadenopathy:    She has no cervical adenopathy.  Neurological: She displays normal reflexes. No cranial nerve deficit. She exhibits normal muscle tone. Coordination normal.  Skin: No rash noted. No erythema.  Psychiatric: She has a normal mood and affect. Her behavior is normal. Judgment and thought content normal.  L calf w/less swelling, NT - less swollen; NT Bradycardic  Pt is home bound.  Lab Results  Component Value Date   WBC 9.5 09/11/2012   HGB 12.0 09/11/2012   HCT 33.8* 09/11/2012   PLT 201 09/11/2012   GLUCOSE 125* 10/13/2013   CHOL 165 01/06/2013   TRIG 76.0 01/06/2013   HDL 54.90 01/06/2013   LDLDIRECT 133.5 01/28/2007   LDLCALC 95 01/06/2013   ALT 27 01/06/2013   AST 26 01/06/2013   NA 141 10/13/2013   K 4.1 10/13/2013   CL 106 10/13/2013   CREATININE 0.8 10/13/2013   BUN 21 10/13/2013   CO2 28 10/13/2013   TSH 1.92 01/06/2013   INR 1.02 11/03/2010   HGBA1C 6.4 10/13/2013         Assessment & Plan:

## 2014-04-20 NOTE — Assessment & Plan Note (Signed)
B12 SL Labs

## 2014-04-20 NOTE — Assessment & Plan Note (Signed)
X ray

## 2014-04-20 NOTE — Assessment & Plan Note (Signed)
Chronic - OA Spinal s tenosis  Potential benefits of a long term opioids use as well as potential risks (i.e. addiction risk, apnea etc) and complications (i.e. Somnolence, constipation and others) were explained to the patient and were aknowledged.   Prednisone 10 mg: take 4 tabs a day x 3 days; then 3 tabs a day x 4 days; then 2 tabs a day x 4 days, then 1 tab a day x 6 days, then stop. Take pc.   Pt is home bound. [place]

## 2014-04-26 ENCOUNTER — Telehealth: Payer: Self-pay | Admitting: Internal Medicine

## 2014-04-26 DIAGNOSIS — Z961 Presence of intraocular lens: Secondary | ICD-10-CM | POA: Diagnosis not present

## 2014-04-26 NOTE — Telephone Encounter (Signed)
Taper off as directed after you skipped 2 days Thx

## 2014-04-26 NOTE — Telephone Encounter (Signed)
Pt called stated predniSONE (DELTASONE) 10 MG tablet is helping her with her back problem but it made her BP went up to 190 yesterday. She spoke with the pharmacy and they told her to stop and inform Dr. Camila Li, she didn't take the medication yesterday. Please call pt

## 2014-04-27 NOTE — Telephone Encounter (Signed)
Pt informed

## 2014-04-29 DIAGNOSIS — E1165 Type 2 diabetes mellitus with hyperglycemia: Secondary | ICD-10-CM | POA: Diagnosis not present

## 2014-04-29 DIAGNOSIS — I10 Essential (primary) hypertension: Secondary | ICD-10-CM | POA: Diagnosis not present

## 2014-04-29 DIAGNOSIS — M5441 Lumbago with sciatica, right side: Secondary | ICD-10-CM | POA: Diagnosis not present

## 2014-04-29 DIAGNOSIS — M1990 Unspecified osteoarthritis, unspecified site: Secondary | ICD-10-CM | POA: Diagnosis not present

## 2014-04-29 DIAGNOSIS — M4806 Spinal stenosis, lumbar region: Secondary | ICD-10-CM | POA: Diagnosis not present

## 2014-04-29 DIAGNOSIS — Z86718 Personal history of other venous thrombosis and embolism: Secondary | ICD-10-CM | POA: Diagnosis not present

## 2014-04-30 ENCOUNTER — Telehealth: Payer: Self-pay | Admitting: Internal Medicine

## 2014-04-30 DIAGNOSIS — M545 Low back pain: Secondary | ICD-10-CM

## 2014-04-30 DIAGNOSIS — M810 Age-related osteoporosis without current pathological fracture: Secondary | ICD-10-CM

## 2014-04-30 NOTE — Telephone Encounter (Signed)
Called danielle no answer LMOM with md response. Fax DME for rolling wheel chair...Kristina Wilkinson

## 2014-04-30 NOTE — Telephone Encounter (Signed)
Ok Thx 

## 2014-04-30 NOTE — Telephone Encounter (Signed)
Danielle from [facility], need an order for patient  For 3 x a week for 2 weeks, and 2 x a week for 3 weeks, and a script for a  Rolling wheel chair tow wheels.fax # [phone] Call Danielle @ [phone]

## 2014-05-03 ENCOUNTER — Telehealth: Payer: Self-pay | Admitting: Internal Medicine

## 2014-05-03 DIAGNOSIS — Z86718 Personal history of other venous thrombosis and embolism: Secondary | ICD-10-CM | POA: Diagnosis not present

## 2014-05-03 DIAGNOSIS — E1165 Type 2 diabetes mellitus with hyperglycemia: Secondary | ICD-10-CM | POA: Diagnosis not present

## 2014-05-03 DIAGNOSIS — M4806 Spinal stenosis, lumbar region: Secondary | ICD-10-CM | POA: Diagnosis not present

## 2014-05-03 DIAGNOSIS — M1990 Unspecified osteoarthritis, unspecified site: Secondary | ICD-10-CM | POA: Diagnosis not present

## 2014-05-03 DIAGNOSIS — M5441 Lumbago with sciatica, right side: Secondary | ICD-10-CM | POA: Diagnosis not present

## 2014-05-03 DIAGNOSIS — I10 Essential (primary) hypertension: Secondary | ICD-10-CM | POA: Diagnosis not present

## 2014-05-03 NOTE — Telephone Encounter (Signed)
Pt has refused to take BP meds Thx

## 2014-05-03 NOTE — Telephone Encounter (Signed)
Danielle from [facility] called stated that patient B/P is 190/80, please advise on what patient need to do.

## 2014-05-05 ENCOUNTER — Telehealth: Payer: Self-pay | Admitting: Internal Medicine

## 2014-05-05 DIAGNOSIS — H9209 Otalgia, unspecified ear: Secondary | ICD-10-CM

## 2014-05-05 NOTE — Telephone Encounter (Signed)
What is it for? Thx

## 2014-05-05 NOTE — Telephone Encounter (Signed)
Pt called in and would like a referral to [facility]NT

## 2014-05-06 NOTE — Telephone Encounter (Signed)
Pt states she is still having ear problems when Dr. Alain Marion check she stated he said that he couldn't see anything, but she states she is still having pain, she stays congested, and always off balance...Kristina Wilkinson

## 2014-05-07 DIAGNOSIS — E1165 Type 2 diabetes mellitus with hyperglycemia: Secondary | ICD-10-CM | POA: Diagnosis not present

## 2014-05-07 DIAGNOSIS — I10 Essential (primary) hypertension: Secondary | ICD-10-CM | POA: Diagnosis not present

## 2014-05-07 DIAGNOSIS — M1990 Unspecified osteoarthritis, unspecified site: Secondary | ICD-10-CM | POA: Diagnosis not present

## 2014-05-07 DIAGNOSIS — M4806 Spinal stenosis, lumbar region: Secondary | ICD-10-CM | POA: Diagnosis not present

## 2014-05-07 DIAGNOSIS — M5441 Lumbago with sciatica, right side: Secondary | ICD-10-CM | POA: Diagnosis not present

## 2014-05-07 DIAGNOSIS — Z86718 Personal history of other venous thrombosis and embolism: Secondary | ICD-10-CM | POA: Diagnosis not present

## 2014-05-08 NOTE — Telephone Encounter (Signed)
ENT ref ok

## 2014-05-10 NOTE — Telephone Encounter (Signed)
Pt has been made aware.../lmb 

## 2014-05-11 DIAGNOSIS — M4806 Spinal stenosis, lumbar region: Secondary | ICD-10-CM | POA: Diagnosis not present

## 2014-05-13 ENCOUNTER — Telehealth: Payer: Self-pay | Admitting: Internal Medicine

## 2014-05-13 NOTE — Telephone Encounter (Signed)
Sent orders for physical therapy and she seen her a couple of times and now patient is refusing any more therapy due to her fear of bursting her aneurism from a long time ago.

## 2014-05-13 NOTE — Telephone Encounter (Signed)
Script for DME dated 04/30/2014 was not received. She is asking that we fax again (please make sure that the NPI and patient DOB is attached to the order) to fax # [phone]

## 2014-05-13 NOTE — Telephone Encounter (Signed)
Noted. Thx.

## 2014-05-13 NOTE — Telephone Encounter (Signed)
Sending message to [facility].

## 2014-05-18 NOTE — Telephone Encounter (Signed)
Left voicemail for Kristina Wilkinson to call me back.

## 2014-05-24 DIAGNOSIS — M2660 Temporomandibular joint disorder, unspecified: Secondary | ICD-10-CM | POA: Diagnosis not present

## 2014-05-24 DIAGNOSIS — H9202 Otalgia, left ear: Secondary | ICD-10-CM | POA: Diagnosis not present

## 2014-05-24 DIAGNOSIS — R42 Dizziness and giddiness: Secondary | ICD-10-CM | POA: Diagnosis not present

## 2014-05-24 DIAGNOSIS — H9192 Unspecified hearing loss, left ear: Secondary | ICD-10-CM | POA: Diagnosis not present

## 2014-06-25 ENCOUNTER — Ambulatory Visit: Payer: Medicare Other | Admitting: Internal Medicine

## 2014-07-06 ENCOUNTER — Other Ambulatory Visit (INDEPENDENT_AMBULATORY_CARE_PROVIDER_SITE_OTHER): Payer: Medicare Other

## 2014-07-06 ENCOUNTER — Encounter: Payer: Self-pay | Admitting: Internal Medicine

## 2014-07-06 ENCOUNTER — Ambulatory Visit (INDEPENDENT_AMBULATORY_CARE_PROVIDER_SITE_OTHER): Payer: Medicare Other | Admitting: Internal Medicine

## 2014-07-06 VITALS — BP 160/90 | HR 68 | Wt 172.0 lb

## 2014-07-06 DIAGNOSIS — E119 Type 2 diabetes mellitus without complications: Secondary | ICD-10-CM | POA: Diagnosis not present

## 2014-07-06 DIAGNOSIS — E538 Deficiency of other specified B group vitamins: Secondary | ICD-10-CM

## 2014-07-06 DIAGNOSIS — M5440 Lumbago with sciatica, unspecified side: Secondary | ICD-10-CM

## 2014-07-06 DIAGNOSIS — I671 Cerebral aneurysm, nonruptured: Secondary | ICD-10-CM

## 2014-07-06 DIAGNOSIS — E559 Vitamin D deficiency, unspecified: Secondary | ICD-10-CM

## 2014-07-06 DIAGNOSIS — I725 Aneurysm of other precerebral arteries: Secondary | ICD-10-CM

## 2014-07-06 DIAGNOSIS — I10 Essential (primary) hypertension: Secondary | ICD-10-CM

## 2014-07-06 DIAGNOSIS — R002 Palpitations: Secondary | ICD-10-CM

## 2014-07-06 LAB — HEPATIC FUNCTION PANEL
ALT: 11 U/L (ref 0–35)
AST: 14 U/L (ref 0–37)
Albumin: 4.2 g/dL (ref 3.5–5.2)
Alkaline Phosphatase: 93 U/L (ref 39–117)
Bilirubin, Direct: 0.1 mg/dL (ref 0.0–0.3)
Total Bilirubin: 0.4 mg/dL (ref 0.2–1.2)
Total Protein: 7.5 g/dL (ref 6.0–8.3)

## 2014-07-06 LAB — BASIC METABOLIC PANEL
BUN: 22 mg/dL (ref 6–23)
CO2: 28 mEq/L (ref 19–32)
Calcium: 10 mg/dL (ref 8.4–10.5)
Chloride: 105 mEq/L (ref 96–112)
Creatinine, Ser: 0.83 mg/dL (ref 0.40–1.20)
GFR: 69.41 mL/min (ref >60.00)
Glucose, Bld: 118 mg/dL — ABNORMAL HIGH (ref 70–99)
Potassium: 4.3 mEq/L (ref 3.5–5.1)
Sodium: 139 mEq/L (ref 135–145)

## 2014-07-06 LAB — HEMOGLOBIN A1C: Hgb A1c MFr Bld: 6.2 % (ref 4.6–6.5)

## 2014-07-06 LAB — TSH: TSH: 1.33 u[IU]/mL (ref 0.35–4.50)

## 2014-07-06 LAB — MAGNESIUM: Magnesium: 2.2 mg/dL (ref 1.5–2.5)

## 2014-07-06 NOTE — Assessment & Plan Note (Signed)
Off vit D - "made me sick" - refusing to take

## 2014-07-06 NOTE — Assessment & Plan Note (Signed)
PAC's Labs, EKG

## 2014-07-06 NOTE — Assessment & Plan Note (Signed)
Pt is refusing to take BP meds

## 2014-07-06 NOTE — Assessment & Plan Note (Signed)
Better Restart Vit D IMPRESSION LS CT 2013:  1. Convex left scoliosis with grade 1 anterolisthesis at L4-L5. No acute osseous findings. 2. Multilevel spondylosis contributes to central and lateral recess stenosis at multiple levels as detailed above. The central stenosis is severe at the L3-L4 and L4-L5 levels.  Original Report Authenticated By: Vivia Ewing, M.D.

## 2014-07-06 NOTE — Assessment & Plan Note (Signed)
Chronic, diet controlled Declined Metformin

## 2014-07-06 NOTE — Progress Notes (Signed)
Subjective:    HPI  F/u  unsteady gait, dizziness. F/u  LBP irrad to R leg x 1 mo - can't walk. Home PT made her worse: stopped after 1 week.  F/u LLE DVT - dist-to-proximal - on ASA only  F/u B12 deficiency  F/u L sided HA off and on, dizziness - h/o brain aneurism  F/u LBP x long time irrad to R leg - better. She saw Dr Durward Fortes had xrays/CT - spinal stenosis - she was offered spinal injections.   F/u hands and feet tingling x months - resolved off Clinoril and off BP meds. It is back now  F/u HTN, insomnia  BP is nl at home  BP Readings from Last 3 Encounters:  07/06/14 160/90  04/20/14 160/86  12/21/13 178/88   Wt Readings from Last 3 Encounters:  07/06/14 172 lb (78.019 kg)  04/20/14 170 lb (77.111 kg)  12/21/13 171 lb (77.565 kg)      Review of Systems  Constitutional: Positive for fatigue. Negative for chills, activity change, appetite change and unexpected weight change.  HENT: Negative for congestion, mouth sores and sinus pressure.   Eyes: Negative for visual disturbance.  Respiratory: Negative for chest tightness.   Gastrointestinal: Positive for nausea. Negative for constipation and abdominal distention.  Genitourinary: Negative for frequency, difficulty urinating and vaginal pain.  Musculoskeletal: Negative for back pain and gait problem.  Skin: Negative for pallor.  Neurological: Positive for weakness. Negative for dizziness, tremors and numbness.  Psychiatric/Behavioral: Negative for confusion and sleep disturbance.       Objective:   Physical Exam  Constitutional: She appears well-developed. No distress.  HENT:  Head: Normocephalic.  Right Ear: External ear normal.  Left Ear: External ear normal.  Nose: Nose normal.  Mouth/Throat: Oropharynx is clear and moist.  Eyes: Conjunctivae are normal. Pupils are equal, round, and reactive to light. Right eye exhibits no discharge. Left eye exhibits no discharge.  Neck: Normal range of motion. Neck  supple. No JVD present. No tracheal deviation present. No thyromegaly present.  Cardiovascular: Normal rate, regular rhythm and normal heart sounds.   Pulmonary/Chest: No stridor. No respiratory distress. She has no wheezes.  Abdominal: Soft. Bowel sounds are normal. She exhibits no distension and no mass. There is no tenderness. There is no rebound and no guarding.  Musculoskeletal: She exhibits no edema or tenderness.  Lymphadenopathy:    She has no cervical adenopathy.  Neurological: She displays normal reflexes. No cranial nerve deficit. She exhibits normal muscle tone. Coordination normal.  Skin: No rash noted. No erythema.  Psychiatric: She has a normal mood and affect. Her behavior is normal. Judgment and thought content normal.  L calf w/less swelling, NT - less swollen; NT Bradycardic - reg. w/ irregular beats Pt is home bound.    Lab Results  Component Value Date   WBC 9.5 09/11/2012   HGB 12.0 09/11/2012   HCT 33.8* 09/11/2012   PLT 201 09/11/2012   GLUCOSE 125* 10/13/2013   CHOL 165 01/06/2013   TRIG 76.0 01/06/2013   HDL 54.90 01/06/2013   LDLDIRECT 133.5 01/28/2007   LDLCALC 95 01/06/2013   ALT 27 01/06/2013   AST 26 01/06/2013   NA 141 10/13/2013   K 4.1 10/13/2013   CL 106 10/13/2013   CREATININE 0.8 10/13/2013   BUN 21 10/13/2013   CO2 28 10/13/2013   TSH 1.92 01/06/2013   INR 1.02 11/03/2010   HGBA1C 6.4 10/13/2013   EKG NSR w/PAC  IMPRESSION LS  CT 2013:  1. Convex left scoliosis with grade 1 anterolisthesis at L4-L5. No acute osseous findings. 2. Multilevel spondylosis contributes to central and lateral recess stenosis at multiple levels as detailed above. The central stenosis is severe at the L3-L4 and L4-L5 levels.  Original Report Authenticated By: Vivia Ewing, M.D.     Assessment & Plan:

## 2014-07-06 NOTE — Progress Notes (Signed)
Pre visit review using our clinic review tool, if applicable. No additional management support is needed unless otherwise documented below in the visit note. 

## 2014-07-06 NOTE — Assessment & Plan Note (Signed)
Vit B12 1000 mcg/d

## 2014-11-09 DIAGNOSIS — M7751 Other enthesopathy of right foot: Secondary | ICD-10-CM | POA: Diagnosis not present

## 2014-11-09 DIAGNOSIS — M7752 Other enthesopathy of left foot: Secondary | ICD-10-CM | POA: Diagnosis not present

## 2014-11-09 DIAGNOSIS — M722 Plantar fascial fibromatosis: Secondary | ICD-10-CM | POA: Diagnosis not present

## 2014-11-09 DIAGNOSIS — M65871 Other synovitis and tenosynovitis, right ankle and foot: Secondary | ICD-10-CM | POA: Diagnosis not present

## 2014-11-09 DIAGNOSIS — M1990 Unspecified osteoarthritis, unspecified site: Secondary | ICD-10-CM | POA: Diagnosis not present

## 2014-11-09 DIAGNOSIS — M7731 Calcaneal spur, right foot: Secondary | ICD-10-CM | POA: Diagnosis not present

## 2014-11-09 DIAGNOSIS — M65872 Other synovitis and tenosynovitis, left ankle and foot: Secondary | ICD-10-CM | POA: Diagnosis not present

## 2014-11-16 ENCOUNTER — Telehealth: Payer: Self-pay | Admitting: Family Medicine

## 2014-11-16 NOTE — Telephone Encounter (Signed)
Ok w/me Thx 

## 2014-11-16 NOTE — Telephone Encounter (Signed)
Patient called and would like to transfer from Dr.Plotnikov to [place].  Patient said she lives closer to [facility] office.  Patient said she spoke to Dr.Plotnikov and it's ok with him if patient transfers to [place]. Patient has friends that see Dr.Duncan. Can patient switch?

## 2014-11-16 NOTE — Telephone Encounter (Signed)
Okay with me if okay with PCP.  Please make the change and schedule 30 min f/u OV here.  Routed to PCP for approval.   Thanks.

## 2014-11-16 NOTE — Telephone Encounter (Signed)
Please schedule 30 min O/V. Thanks!

## 2014-11-18 DIAGNOSIS — M7751 Other enthesopathy of right foot: Secondary | ICD-10-CM | POA: Diagnosis not present

## 2014-11-18 DIAGNOSIS — M2141 Flat foot [pes planus] (acquired), right foot: Secondary | ICD-10-CM | POA: Diagnosis not present

## 2014-11-18 DIAGNOSIS — M7752 Other enthesopathy of left foot: Secondary | ICD-10-CM | POA: Diagnosis not present

## 2014-12-16 ENCOUNTER — Ambulatory Visit (INDEPENDENT_AMBULATORY_CARE_PROVIDER_SITE_OTHER): Payer: Medicare Other | Admitting: Family Medicine

## 2014-12-16 ENCOUNTER — Encounter: Payer: Self-pay | Admitting: Family Medicine

## 2014-12-16 VITALS — BP 200/96 | HR 75 | Temp 98.7°F | Ht 61.5 in | Wt 168.2 lb

## 2014-12-16 DIAGNOSIS — I10 Essential (primary) hypertension: Secondary | ICD-10-CM

## 2014-12-16 DIAGNOSIS — R739 Hyperglycemia, unspecified: Secondary | ICD-10-CM

## 2014-12-16 DIAGNOSIS — E559 Vitamin D deficiency, unspecified: Secondary | ICD-10-CM

## 2014-12-16 DIAGNOSIS — Z7189 Other specified counseling: Secondary | ICD-10-CM

## 2014-12-16 DIAGNOSIS — Z0000 Encounter for general adult medical examination without abnormal findings: Secondary | ICD-10-CM

## 2014-12-16 LAB — COMPREHENSIVE METABOLIC PANEL
ALT: 13 U/L (ref 0–35)
AST: 16 U/L (ref 0–37)
Albumin: 3.8 g/dL (ref 3.5–5.2)
Alkaline Phosphatase: 81 U/L (ref 39–117)
BUN: 22 mg/dL (ref 6–23)
CO2: 26 mEq/L (ref 19–32)
Calcium: 10 mg/dL (ref 8.4–10.5)
Chloride: 106 mEq/L (ref 96–112)
Creatinine, Ser: 0.83 mg/dL (ref 0.40–1.20)
GFR: 69.33 mL/min (ref >60.00)
Glucose, Bld: 114 mg/dL — ABNORMAL HIGH (ref 70–99)
Potassium: 4.5 mEq/L (ref 3.5–5.1)
Sodium: 139 mEq/L (ref 135–145)
Total Bilirubin: 0.4 mg/dL (ref 0.2–1.2)
Total Protein: 7 g/dL (ref 6.0–8.3)

## 2014-12-16 LAB — HEMOGLOBIN A1C: Hgb A1c MFr Bld: 6.3 % (ref 4.6–6.5)

## 2014-12-16 LAB — LIPID PANEL
Cholesterol: 192 mg/dL (ref 0–200)
HDL: 59.9 mg/dL (ref >39.00)
LDL Cholesterol: 112 mg/dL — ABNORMAL HIGH (ref 0–99)
NonHDL: 131.67
Total CHOL/HDL Ratio: 3
Triglycerides: 97 mg/dL (ref 0.0–149.0)
VLDL: 19.4 mg/dL (ref 0.0–40.0)

## 2014-12-16 MED ORDER — LISINOPRIL 5 MG PO TABS: 5.0000 mg | ORAL_TABLET | Freq: Every day | ORAL | Status: DC

## 2014-12-16 NOTE — Patient Instructions (Addendum)
We'll contact you with your lab report. If your BP stays above 130/90 at home, then let me know.  Please update me in a few days about your pressure at home.   Take care. Glad to see you.

## 2014-12-16 NOTE — Progress Notes (Signed)
Pre visit review using our clinic review tool, if applicable. No additional management support is needed unless otherwise documented below in the visit note.  Advance directive d/w pt.  Would have her son designated if patient were incapacitated.    Hypertension:    Off meds, she didn't want to try indapamide.  She felt like she couldn't keep her balance while on med.  Chest pain with exertion:no Edema: no Short of breath: no Average home BPs: usually 102-585 systolic at home.  "It goes up when I come to the office."  Declined flu shot.  D/w pt.   Known spinal stenosis.  Per chart, she was offered injections prev.  She denied this.  "They never told me about that."  Worse standing, no pain sitting.    H/o DM2.  Diet controlled.  Due for labs.  No meds.  She is still working on diet.    Meds, vitals, and allergies reviewed.   ROS: See HPI.  Otherwise, noncontributory.  GEN: nad, alert and oriented HEENT: mucous membranes moist NECK: supple w/o LA CV: rrr.  PULM: ctab, no inc wob ABD: soft, +bs EXT: no edema SKIN: no acute rash

## 2014-12-17 ENCOUNTER — Telehealth: Payer: Self-pay | Admitting: Family Medicine

## 2014-12-17 DIAGNOSIS — Z7189 Other specified counseling: Secondary | ICD-10-CM | POA: Insufficient documentation

## 2014-12-17 NOTE — Assessment & Plan Note (Signed)
See notes on labs.  >25 minutes spent in face to face time with patient, >50% spent in counselling or coordination of care.  

## 2014-12-17 NOTE — Telephone Encounter (Signed)
See result note.  

## 2014-12-17 NOTE — Telephone Encounter (Signed)
Pt returned your call.  

## 2014-12-17 NOTE — Assessment & Plan Note (Signed)
She agreed to start lisinopril 5mg  a day and update me re: BP.  I told her that elevated BP made CVA more likely.  We can't do much else (ie her back) until her BP is improved.  She knows to update me.

## 2014-12-20 ENCOUNTER — Telehealth: Payer: Self-pay

## 2014-12-20 MED ORDER — LISINOPRIL 5 MG PO TABS: 2.5000 mg | ORAL_TABLET | Freq: Every day | ORAL | Status: DC

## 2014-12-20 NOTE — Telephone Encounter (Signed)
Pt left v/m; pt was seen to establish care on 12/16/14. Pt said took Lisinopril and BP went down to 110 and pt felt bad like going to pass out;spoke with pt and she does not know if she actually passed out or not; pt has not taken any more lisinopril. Today pts BP is 222 for systolic; pt cannot remember what diastolic was.pt said today she feels slightly off balance but that is how she usually feels when BP goes up. No CP, SOB or H/a. Pt request cb. [employer]

## 2014-12-20 NOTE — Telephone Encounter (Signed)
Patient advised and will call with update in a few days.

## 2014-12-20 NOTE — Telephone Encounter (Signed)
Duly noted.  The point of the 5mg  was to try not to drop her BP too much.  If still with SBP 164, reasonable to consider 1/2 tab (2.5mg ) a day.  Please update me as needed.  Thanks.

## 2014-12-24 NOTE — Telephone Encounter (Signed)
Spoke with patient and advised results   

## 2014-12-24 NOTE — Telephone Encounter (Signed)
Please call her.  I would continue as is.  If more trouble with balance or lightheaded, then stop the med and update me.  Thanks for the update.

## 2014-12-24 NOTE — Telephone Encounter (Addendum)
Pt left v/m; BP averaging 133/65 since taking Lisinopril 5 mg taking 1/2 tab; pt feels OK today; just occasional off balance feeling. pt also wanted Dr Damita Dunnings to know that she wants to keep him as PCP. Pt request cb to let her know if Dr Damita Dunnings is pleased with BP averaging 133/65.

## 2015-01-11 ENCOUNTER — Ambulatory Visit: Payer: Medicare Other | Admitting: Internal Medicine

## 2015-04-28 DIAGNOSIS — L299 Pruritus, unspecified: Secondary | ICD-10-CM | POA: Diagnosis not present

## 2015-06-21 ENCOUNTER — Encounter: Payer: Self-pay | Admitting: Family Medicine

## 2015-06-21 ENCOUNTER — Ambulatory Visit (INDEPENDENT_AMBULATORY_CARE_PROVIDER_SITE_OTHER): Payer: Medicare Other | Admitting: Family Medicine

## 2015-06-21 VITALS — BP 262/90 | HR 85 | Temp 98.4°F | Wt 172.5 lb

## 2015-06-21 DIAGNOSIS — H9193 Unspecified hearing loss, bilateral: Secondary | ICD-10-CM

## 2015-06-21 DIAGNOSIS — Z8639 Personal history of other endocrine, nutritional and metabolic disease: Secondary | ICD-10-CM | POA: Diagnosis not present

## 2015-06-21 DIAGNOSIS — H9190 Unspecified hearing loss, unspecified ear: Secondary | ICD-10-CM | POA: Insufficient documentation

## 2015-06-21 DIAGNOSIS — I10 Essential (primary) hypertension: Secondary | ICD-10-CM

## 2015-06-21 LAB — BASIC METABOLIC PANEL
BUN: 26 mg/dL — ABNORMAL HIGH (ref 6–23)
CO2: 25 mEq/L (ref 19–32)
Calcium: 9.8 mg/dL (ref 8.4–10.5)
Chloride: 107 mEq/L (ref 96–112)
Creatinine, Ser: 0.8 mg/dL (ref 0.40–1.20)
GFR: 72.25 mL/min (ref >60.00)
Glucose, Bld: 123 mg/dL — ABNORMAL HIGH (ref 70–99)
Potassium: 4.2 mEq/L (ref 3.5–5.1)
Sodium: 142 mEq/L (ref 135–145)

## 2015-06-21 LAB — HEMOGLOBIN A1C: Hgb A1c MFr Bld: 6.7 % — ABNORMAL HIGH (ref 4.6–6.5)

## 2015-06-21 NOTE — Assessment & Plan Note (Addendum)
CVA risk discussed. "I'm not going to take any meds." She disputes her medical records re: prev calls. She reported tolerance to 2.5mg  lisinopril with prev call back in 12/2015. She disputes this now. "I never said that." Per patient, BP was too low at home and she stopped med. She claims to have discussed this with staff. We have no record of that. I told her that I think she is incorrect- she may have lower BP at home, and may not have tolerated low dose lisinopril, but she didn't update [place] with that on the phone call. She completely disputes this and states she did update [place]. I told her that if she didn't think we could keep an accurate record of conversations, then she should get another MD/clinic. She denies CP, SOB. States BP is normal at home.  >25 minutes spent in face to face time with patient, >50% spent in counselling or coordination of care.  This isn't a case where I'm not listening to the patient- I am listening and her BP may be lower/normal at home off meds, but I think she is wrong about/misremembering the prev conversations. Either way, she refuses treatment for BP.

## 2015-06-21 NOTE — Progress Notes (Addendum)
Pre visit review using our clinic review tool, if applicable. No additional management support is needed unless otherwise documented below in the visit note.  Hypertension:  CVA risk discussed. "I'm not going to take any meds."  She disputes her medical records re: prev calls.  She reported tolerance to 2.5mg  lisinopril with prev call back in 12/2014.  She disputes this now.  "I never said that."  Per patient, BP was too low at home and she stopped med.  She claims to have discussed this with staff.  We have no record of that.  I told her that I think she is incorrect- she may have lower BP at home, and may not have tolerated low dose lisinopril, but she didn't update [place] with that on the phone call.  She completely disputes this and states she did update [place].  I told her that if she didn't think we could keep an accurate record of conversations, then she should get another MD/clinic.   She denies CP, SOB.  States BP is normal at home.    His DM2. Due for f/u labs.    B hearing loss, L>R, wanted recheck with ENT, for second opinion.  D/w pt.  Longstanding.   Meds, vitals, and allergies reviewed.   ROS: Per HPI unless specifically indicated in ROS section   GEN: nad, alert and oriented HEENT: mucous membranes moist, TM wnl B NECK: supple w/o LA CV: rrr. PULM: ctab, no inc wob EXT: no edema Dec hearing to faint noises in B ears, on testing in exam room- can't hear fingers rubbed together near either pinna

## 2015-06-21 NOTE — Patient Instructions (Signed)
Go to the lab on the way out.  We'll contact you with your lab report. Rosaria Ferries will call about your referral. Take care.

## 2015-06-21 NOTE — Assessment & Plan Note (Signed)
Refer for ENT for 2nd opinion.  Had seen ENT in [place], refused to go back.

## 2015-06-27 ENCOUNTER — Other Ambulatory Visit: Payer: Self-pay | Admitting: Family Medicine

## 2015-06-27 DIAGNOSIS — E119 Type 2 diabetes mellitus without complications: Secondary | ICD-10-CM | POA: Insufficient documentation

## 2015-06-29 DIAGNOSIS — H903 Sensorineural hearing loss, bilateral: Secondary | ICD-10-CM | POA: Diagnosis not present

## 2015-06-29 DIAGNOSIS — J310 Chronic rhinitis: Secondary | ICD-10-CM | POA: Diagnosis not present

## 2015-09-06 DIAGNOSIS — M25512 Pain in left shoulder: Secondary | ICD-10-CM | POA: Diagnosis not present

## 2015-09-06 DIAGNOSIS — M179 Osteoarthritis of knee, unspecified: Secondary | ICD-10-CM | POA: Diagnosis not present

## 2015-09-06 DIAGNOSIS — M25562 Pain in left knee: Secondary | ICD-10-CM | POA: Diagnosis not present

## 2015-09-06 DIAGNOSIS — I729 Aneurysm of unspecified site: Secondary | ICD-10-CM | POA: Diagnosis not present

## 2015-09-06 DIAGNOSIS — M4800 Spinal stenosis, site unspecified: Secondary | ICD-10-CM | POA: Diagnosis not present

## 2015-09-06 DIAGNOSIS — M150 Primary generalized (osteo)arthritis: Secondary | ICD-10-CM | POA: Diagnosis not present

## 2015-10-14 ENCOUNTER — Other Ambulatory Visit: Payer: Self-pay

## 2016-01-17 ENCOUNTER — Other Ambulatory Visit: Payer: Self-pay | Admitting: Orthopedic Surgery

## 2016-01-17 DIAGNOSIS — M545 Low back pain: Secondary | ICD-10-CM

## 2016-01-17 DIAGNOSIS — M1712 Unilateral primary osteoarthritis, left knee: Secondary | ICD-10-CM | POA: Diagnosis not present

## 2016-01-17 DIAGNOSIS — M79604 Pain in right leg: Secondary | ICD-10-CM | POA: Diagnosis not present

## 2016-01-17 DIAGNOSIS — M5441 Lumbago with sciatica, right side: Secondary | ICD-10-CM | POA: Diagnosis not present

## 2016-01-17 DIAGNOSIS — M25562 Pain in left knee: Secondary | ICD-10-CM | POA: Diagnosis not present

## 2016-01-17 DIAGNOSIS — M4316 Spondylolisthesis, lumbar region: Secondary | ICD-10-CM | POA: Diagnosis not present

## 2016-01-17 DIAGNOSIS — M5136 Other intervertebral disc degeneration, lumbar region: Secondary | ICD-10-CM | POA: Diagnosis not present

## 2016-01-17 DIAGNOSIS — M25561 Pain in right knee: Secondary | ICD-10-CM | POA: Diagnosis not present

## 2016-01-17 DIAGNOSIS — Z96653 Presence of artificial knee joint, bilateral: Secondary | ICD-10-CM | POA: Diagnosis not present

## 2016-02-08 ENCOUNTER — Encounter: Payer: Self-pay | Admitting: Internal Medicine

## 2016-02-08 ENCOUNTER — Other Ambulatory Visit: Payer: Medicare Other

## 2016-02-08 NOTE — Telephone Encounter (Signed)
error 

## 2016-02-08 NOTE — Telephone Encounter (Signed)
Kristina Wilkinson,  Please mark the errors listed as reviewed

## 2016-02-08 NOTE — Telephone Encounter (Signed)
Please correct the following errors-- unable to close the note   There are procedures that are unsigned, pended, signed & held, or awaiting second sign. Sign, release, or remove these procedures before discharging the patient or printing the AVS.  This encounter contains procedures and/or medications that do not have an associated diagnosis. <BR> Please associate these orders before closing this encounter.  There are procedures that are unsigned, pended, signed & held, or awaiting second sign. Sign, release, or remove these procedures before discharging the patient or printing the AVS.

## 2016-02-16 ENCOUNTER — Inpatient Hospital Stay: Admission: RE | Admit: 2016-02-16 | Payer: Medicare Other | Source: Ambulatory Visit

## 2016-02-29 ENCOUNTER — Inpatient Hospital Stay: Admission: RE | Admit: 2016-02-29 | Payer: Medicare Other | Source: Ambulatory Visit

## 2016-03-15 DIAGNOSIS — M170 Bilateral primary osteoarthritis of knee: Secondary | ICD-10-CM | POA: Diagnosis not present

## 2016-03-15 DIAGNOSIS — M7542 Impingement syndrome of left shoulder: Secondary | ICD-10-CM | POA: Diagnosis not present

## 2016-03-15 DIAGNOSIS — Z9689 Presence of other specified functional implants: Secondary | ICD-10-CM | POA: Diagnosis not present

## 2016-03-15 DIAGNOSIS — M25712 Osteophyte, left shoulder: Secondary | ICD-10-CM | POA: Diagnosis not present

## 2016-03-15 DIAGNOSIS — M19012 Primary osteoarthritis, left shoulder: Secondary | ICD-10-CM | POA: Diagnosis not present

## 2016-03-16 ENCOUNTER — Inpatient Hospital Stay: Admission: RE | Admit: 2016-03-16 | Payer: Medicare Other | Source: Ambulatory Visit

## 2016-04-03 DIAGNOSIS — M25512 Pain in left shoulder: Secondary | ICD-10-CM

## 2016-04-03 DIAGNOSIS — G8929 Other chronic pain: Secondary | ICD-10-CM | POA: Insufficient documentation

## 2016-04-03 DIAGNOSIS — M7582 Other shoulder lesions, left shoulder: Secondary | ICD-10-CM | POA: Diagnosis not present

## 2016-04-17 DIAGNOSIS — H401113 Primary open-angle glaucoma, right eye, severe stage: Secondary | ICD-10-CM | POA: Diagnosis not present

## 2016-05-18 DIAGNOSIS — H401113 Primary open-angle glaucoma, right eye, severe stage: Secondary | ICD-10-CM | POA: Diagnosis not present

## 2016-07-13 ENCOUNTER — Emergency Department (HOSPITAL_COMMUNITY): Payer: Medicare Other

## 2016-07-13 ENCOUNTER — Observation Stay (HOSPITAL_COMMUNITY)
Admission: EM | Admit: 2016-07-13 | Discharge: 2016-07-14 | Disposition: A | Discharge status: Home / Self Care | Payer: Medicare Other | Attending: Internal Medicine | Admitting: Internal Medicine

## 2016-07-13 ENCOUNTER — Encounter (HOSPITAL_COMMUNITY): Payer: Self-pay | Admitting: Emergency Medicine

## 2016-07-13 DIAGNOSIS — S0003XA Contusion of scalp, initial encounter: Secondary | ICD-10-CM | POA: Diagnosis not present

## 2016-07-13 DIAGNOSIS — S02119A Unspecified fracture of occiput, initial encounter for closed fracture: Secondary | ICD-10-CM

## 2016-07-13 DIAGNOSIS — G8929 Other chronic pain: Secondary | ICD-10-CM | POA: Insufficient documentation

## 2016-07-13 DIAGNOSIS — S0101XA Laceration without foreign body of scalp, initial encounter: Secondary | ICD-10-CM

## 2016-07-13 DIAGNOSIS — T148XXA Other injury of unspecified body region, initial encounter: Secondary | ICD-10-CM

## 2016-07-13 DIAGNOSIS — G629 Polyneuropathy, unspecified: Secondary | ICD-10-CM | POA: Diagnosis not present

## 2016-07-13 DIAGNOSIS — M1990 Unspecified osteoarthritis, unspecified site: Secondary | ICD-10-CM | POA: Diagnosis not present

## 2016-07-13 DIAGNOSIS — S020XXA Fracture of vault of skull, initial encounter for closed fracture: Principal | ICD-10-CM | POA: Insufficient documentation

## 2016-07-13 DIAGNOSIS — E538 Deficiency of other specified B group vitamins: Secondary | ICD-10-CM | POA: Insufficient documentation

## 2016-07-13 DIAGNOSIS — K219 Gastro-esophageal reflux disease without esophagitis: Secondary | ICD-10-CM | POA: Diagnosis not present

## 2016-07-13 DIAGNOSIS — Z79899 Other long term (current) drug therapy: Secondary | ICD-10-CM | POA: Diagnosis not present

## 2016-07-13 DIAGNOSIS — I4581 Long QT syndrome: Secondary | ICD-10-CM | POA: Diagnosis not present

## 2016-07-13 DIAGNOSIS — S0291XA Unspecified fracture of skull, initial encounter for closed fracture: Secondary | ICD-10-CM | POA: Diagnosis present

## 2016-07-13 DIAGNOSIS — E119 Type 2 diabetes mellitus without complications: Secondary | ICD-10-CM | POA: Diagnosis not present

## 2016-07-13 DIAGNOSIS — Z794 Long term (current) use of insulin: Secondary | ICD-10-CM | POA: Insufficient documentation

## 2016-07-13 DIAGNOSIS — Z880 Allergy status to penicillin: Secondary | ICD-10-CM | POA: Insufficient documentation

## 2016-07-13 DIAGNOSIS — G9389 Other specified disorders of brain: Secondary | ICD-10-CM | POA: Diagnosis not present

## 2016-07-13 DIAGNOSIS — S02102A Fracture of base of skull, left side, initial encounter for closed fracture: Secondary | ICD-10-CM | POA: Diagnosis not present

## 2016-07-13 DIAGNOSIS — S0093XA Contusion of unspecified part of head, initial encounter: Secondary | ICD-10-CM | POA: Diagnosis not present

## 2016-07-13 DIAGNOSIS — I10 Essential (primary) hypertension: Secondary | ICD-10-CM | POA: Diagnosis not present

## 2016-07-13 DIAGNOSIS — S0990XA Unspecified injury of head, initial encounter: Secondary | ICD-10-CM | POA: Diagnosis not present

## 2016-07-13 HISTORY — DX: Unspecified fracture of skull, initial encounter for closed fracture: S02.91XA

## 2016-07-13 LAB — CBC WITH DIFFERENTIAL/PLATELET
Basophils Absolute: 0 10*3/uL (ref 0.0–0.1)
Basophils Relative: 0 %
Eosinophils Absolute: 0 10*3/uL (ref 0.0–0.7)
Eosinophils Relative: 0 %
HCT: 38.6 % (ref 36.0–46.0)
Hemoglobin: 12.6 g/dL (ref 12.0–15.0)
Lymphocytes Relative: 12 %
Lymphs Abs: 1.2 10*3/uL (ref 0.7–4.0)
MCH: 29.4 pg (ref 26.0–34.0)
MCHC: 32.6 g/dL (ref 30.0–36.0)
MCV: 90.2 fL (ref 78.0–100.0)
Monocytes Absolute: 0.6 10*3/uL (ref 0.1–1.0)
Monocytes Relative: 6 %
Neutro Abs: 8.4 10*3/uL — ABNORMAL HIGH (ref 1.7–7.7)
Neutrophils Relative %: 82 %
Platelets: 161 10*3/uL (ref 150–400)
RBC: 4.28 MIL/uL (ref 3.87–5.11)
RDW: 12.8 % (ref 11.5–15.5)
WBC: 10.2 10*3/uL (ref 4.0–10.5)

## 2016-07-13 LAB — BASIC METABOLIC PANEL
Anion gap: 9 (ref 5–15)
BUN: 22 mg/dL — ABNORMAL HIGH (ref 6–20)
CO2: 22 mmol/L (ref 22–32)
Calcium: 9.5 mg/dL (ref 8.9–10.3)
Chloride: 110 mmol/L (ref 101–111)
Creatinine, Ser: 0.82 mg/dL (ref 0.44–1.00)
GFR calc Af Amer: >60 mL/min (ref >60)
GFR calc non Af Amer: >60 mL/min (ref >60)
Glucose, Bld: 137 mg/dL — ABNORMAL HIGH (ref 65–99)
Potassium: 4 mmol/L (ref 3.5–5.1)
Sodium: 141 mmol/L (ref 135–145)

## 2016-07-13 LAB — GLUCOSE, CAPILLARY: Glucose-Capillary: 121 mg/dL — ABNORMAL HIGH (ref 65–99)

## 2016-07-13 MED ORDER — POLYETHYL GLYCOL-PROPYL GLYCOL 0.4-0.3 % OP SOLN: 1.0000 [drp] | Freq: Two times a day (BID) | OPHTHALMIC | Status: DC

## 2016-07-13 MED ORDER — ONDANSETRON HCL 4 MG/2ML IJ SOLN: 4.0000 mg | Freq: Four times a day (QID) | INTRAMUSCULAR | Status: DC | PRN

## 2016-07-13 MED ORDER — LABETALOL HCL 5 MG/ML IV SOLN
5.0000 mg | Freq: Once | INTRAVENOUS | Status: AC
  Administered 2016-07-13: 5 mg via INTRAVENOUS

## 2016-07-13 MED ORDER — ONDANSETRON HCL 4 MG PO TABS: 4.0000 mg | ORAL_TABLET | Freq: Four times a day (QID) | ORAL | Status: DC | PRN

## 2016-07-13 MED ORDER — HYDRALAZINE HCL 20 MG/ML IJ SOLN
10.0000 mg | Freq: Once | INTRAMUSCULAR | Status: DC
  Filled 2016-07-13: qty 1

## 2016-07-13 MED ORDER — ENALAPRIL MALEATE 2.5 MG PO TABS
2.5000 mg | ORAL_TABLET | Freq: Two times a day (BID) | ORAL | Status: DC
  Administered 2016-07-13: 2.5 mg via ORAL
  Filled 2016-07-13 (×2): qty 1

## 2016-07-13 MED ORDER — LABETALOL HCL 5 MG/ML IV SOLN
5.0000 mg | Freq: Once | INTRAVENOUS | Status: AC
  Administered 2016-07-13: 5 mg via INTRAVENOUS
  Filled 2016-07-13: qty 4

## 2016-07-13 MED ORDER — ACETAMINOPHEN 325 MG PO TABS
325.0000 mg | ORAL_TABLET | Freq: Once | ORAL | Status: DC
  Filled 2016-07-13: qty 1

## 2016-07-13 MED ORDER — HYDRALAZINE HCL 20 MG/ML IJ SOLN
10.0000 mg | INTRAMUSCULAR | Status: DC | PRN
  Administered 2016-07-14: 10 mg via INTRAVENOUS
  Filled 2016-07-13: qty 1

## 2016-07-13 MED ORDER — HYPROMELLOSE (GONIOSCOPIC) 2.5 % OP SOLN
1.0000 [drp] | Freq: Two times a day (BID) | OPHTHALMIC | Status: DC
  Administered 2016-07-14: 1 [drp] via OPHTHALMIC
  Filled 2016-07-13: qty 15

## 2016-07-13 MED ORDER — BACITRACIN ZINC 500 UNIT/GM EX OINT
TOPICAL_OINTMENT | Freq: Two times a day (BID) | CUTANEOUS | Status: DC
  Administered 2016-07-13 – 2016-07-14 (×2): via TOPICAL
  Filled 2016-07-13 (×2): qty 28.35

## 2016-07-13 MED ORDER — OXYCODONE-ACETAMINOPHEN 5-325 MG PO TABS
1.0000 | ORAL_TABLET | Freq: Once | ORAL | Status: DC
  Filled 2016-07-13: qty 1

## 2016-07-13 MED ORDER — KETOROLAC TROMETHAMINE 15 MG/ML IJ SOLN
15.0000 mg | Freq: Four times a day (QID) | INTRAMUSCULAR | Status: DC | PRN
  Filled 2016-07-13: qty 1

## 2016-07-13 MED ORDER — LIDOCAINE-EPINEPHRINE (PF) 2 %-1:200000 IJ SOLN
10.0000 mL | Freq: Once | INTRAMUSCULAR | Status: AC
  Administered 2016-07-13: 10 mL via INTRADERMAL
  Filled 2016-07-13: qty 20

## 2016-07-13 MED ORDER — KETOROLAC TROMETHAMINE 15 MG/ML IJ SOLN
15.0000 mg | Freq: Once | INTRAMUSCULAR | Status: AC
  Administered 2016-07-13: 15 mg via INTRAVENOUS
  Filled 2016-07-13: qty 1

## 2016-07-13 MED ORDER — TETANUS-DIPHTH-ACELL PERTUSSIS 5-2.5-18.5 LF-MCG/0.5 IM SUSP
0.5000 mL | Freq: Once | INTRAMUSCULAR | Status: AC
  Administered 2016-07-13: 0.5 mL via INTRAMUSCULAR
  Filled 2016-07-13: qty 0.5

## 2016-07-13 MED ORDER — OXYCODONE-ACETAMINOPHEN 5-325 MG PO TABS: 0.5000 | ORAL_TABLET | ORAL | Status: DC | PRN

## 2016-07-13 MED ORDER — LABETALOL HCL 5 MG/ML IV SOLN
10.0000 mg | Freq: Once | INTRAVENOUS | Status: DC
  Filled 2016-07-13: qty 4

## 2016-07-13 NOTE — ED Notes (Signed)
Pt bp elevated; EDP made aware, nothing ordered at this time.

## 2016-07-13 NOTE — ED Triage Notes (Signed)
[name] EMS from home for a fall. Pt was holding onto a golf cart when someone moved it and pt fell straight back and hit head on cement. Pt denies LOC, states she feels dizzy and nauseous. Hematoma/LAC noted to back of head, and skin tears to both hands; pt not on blood thinners, bleeding controlled at this time. Pt A&O x 4; Resp e/u; NAD noted at this time.

## 2016-07-13 NOTE — ED Notes (Signed)
Pt BP 205/70; EDP made aware.

## 2016-07-13 NOTE — ED Notes (Signed)
Fall bracelet and socks places on Pt

## 2016-07-13 NOTE — ED Notes (Signed)
Attempted to call report
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
## 2016-07-13 NOTE — ED Notes (Signed)
Pt refusing BP medication, states "just take the cuff off for a few minutes, it'll come down." EDP made aware.

## 2016-07-13 NOTE — Progress Notes (Signed)
Arrived from Ed. Alert and oriented. Denies any pain. Oriented to room. Call light within reach. 

## 2016-07-13 NOTE — ED Notes (Signed)
Pt refusing BP medication; EDP made aware.

## 2016-07-13 NOTE — ED Notes (Signed)
PIV not drawing labs; phleb notified to come draw labs.

## 2016-07-13 NOTE — ED Notes (Signed)
Pt used bedside commode with one person assist. Pt tolerated well and denies dizziness. Pt states she felt good sitting up.

## 2016-07-13 NOTE — ED Notes (Signed)
Patient transported to CT 

## 2016-07-13 NOTE — ED Notes (Signed)
ED Provider at bedside to suture/staple lac.

## 2016-07-13 NOTE — ED Notes (Signed)
Lac/hematoma on posterior head cleansed and wrapped with non adherent dressing. Bacitracin applied to  bilat skin tears to hands  And wrapped with nonadherent dressing.

## 2016-07-13 NOTE — ED Provider Notes (Signed)
Patient was handed off to me by previous ED PA at shift change. Please see previous note for full history of present illness and ROS. At shift change, plan was to follow-up on pending lab work and admit for pain control, observation and possible consult for neurosurgery.  Briefly, patient is an 83 year old female with history of hypertension, not taking any hypertensive, who presents to ED with mechanical fall. CT scan of head showed posterior skull fracture and hematoma. CBC and BMP unremarkable. Physical Exam  BP (!) 218/88   Pulse 64   Temp 97.8 F (36.6 C) (Oral)   Resp 15   Ht 5\' 2"  (1.575 m)   Wt 72.6 kg (160 lb)   SpO2 97%   BMI 29.26 kg/m   Physical Exam  Constitutional: She is oriented to person, place, and time. She appears well-developed and well-nourished. No distress.  Gauze wrapped around head, dressing to posterior head in place and dry.   HENT:  Head: Normocephalic and atraumatic.  Nose: Nose normal.  Mouth/Throat: Oropharynx is clear and moist. No oropharyngeal exudate.  Eyes: Conjunctivae and EOM are normal. Pupils are equal, round, and reactive to light.  Neck: Normal range of motion. Neck supple.  No midline cervical spine tenderness Full neck ROM without pain   Cardiovascular: Normal rate, regular rhythm, normal heart sounds and intact distal pulses.   No murmur heard. Pulmonary/Chest: Effort normal and breath sounds normal. No respiratory distress. She has no wheezes. She has no rales. She exhibits no tenderness.  Abdominal: Soft. Bowel sounds are normal. There is no tenderness.  Musculoskeletal: Normal range of motion. She exhibits no deformity.  Neurological: She is alert and oriented to person, place, and time. No sensory deficit.  Pt is alert and oriented. Speech and phonation normal.   Thought process coherent.   Strength 5/5 in upper and lower extremities.   Sensation to light touch intact in upper and lower extremities.  No leg drift.  Intact finger to  nose test. No truncal sway. CN I not tested CN II full visual fields  CN III, IV, VI PEERL and EOM intact CN V light touch intact in all 3 divisions of trigeminal nerve CN VII facial nerve movements intact, symmetric CN VIII hearing intact to finger rub CN IX, X no uvula deviation, symmetric soft palate rise CN XI 5/5 SCM and trapezius strength  CN XII Tongue midline with symmetric L/R movement  Skin: Skin is warm and dry. Capillary refill takes less than 2 seconds.  Psychiatric: She has a normal mood and affect. Her behavior is normal. Judgment and thought content normal.  Patient is oriented to self, place and time. Good historian.  Nursing note and vitals reviewed.   ED Course  Procedures  MDM At shift change, plan was to follow-up on pending lab work and admit for pain control, worsening unsteadiness. Patient lives alone.  Neurosurgery has been consulted and they agree with admission.  Patient's SBP was noted to be trending up, ~200-250. No hypertensives initiated prior to shift change.  Patient does not take hypertensives at home.  Patient is well-appearing, she denies headache, changes in vision, nausea, vomiting, chest pain or back pain, focal weakness. Neuro exam normal. I had long discussion about possible complications with elevated SBP given history of aneurysm s/p clipping and recent head trauma.  I recommended IV antihypertensive.  Initially, patient declined hypertensive and requested time for BP to come down.  I treated patient's pain and gave her time time as she  requested. Again I reassessed patient and SBP remains elevated. Ultimately patient agreed with antihypertensives. Will monitor blood pressure and requested admission for skull fracture and new onset hypertension. Patient may benefit from home health aide at discharge.   Kinnie Feil, PA-C 07/13/16 2056    Deno Etienne, DO 07/13/16 2112

## 2016-07-13 NOTE — ED Provider Notes (Signed)
[facility] DEPT Provider Note   CSN: [id number] Arrival date & time: 07/13/16  1111     History   Chief Complaint Chief Complaint  Patient presents with  . Fall    HPI Kristina Wilkinson is a 83 y.o. female who presents via EMS after a fall this morning. Patient states that she was at home holding onto a golf cart when it moved causing her to fall backwards and hit her head on cement. Patient denies any LOC. Patient remembers the entire event. She denies any vomiting since the incident. On ED arrival patient has bleeding to the posterior scalp. Patient denies being on any blood thinners and does not take any aspirin. Patient reporting some mild nausea And dizziness. She has not ambulated since the incident. She does not know when her last tetanus shot was. Patient has a history of a ruptured aneurysm in 1983. Patient was evaluated at [facility] where she had the aneurysm clipped. She has had no issues since then. She denies any vomiting, neck pain, back pain.   The history is provided by the patient.    Past Medical History:  Diagnosis Date  . Arthritis   . CNS aneurysm 1983   s/p clipping  . Diabetes mellitus type II    diet controlled   . DVT (deep venous thrombosis) ([place]) 10/15  . Elevated glucose   . Hypertension   . Insomnia   . Knee pain    Left Dr. Meyer Cory  . LBP (low back pain)   . PN (peripheral neuropathy)    unknown etiol    Patient Active Problem List   Diagnosis Date Noted  . Diabetes mellitus without complication ([place]) 49/70/2637  . Hearing loss 06/21/2015  . Advance care planning 12/17/2014  . Palpitations 07/06/2014  . Pain in joint, ankle and foot 04/20/2014  . DVT, lower extremity ([place]) 11/19/2013  . Edema 10/20/2013  . Headache(784.0) 10/20/2013  . Left leg swelling 10/20/2013  . Aneurysm of basilar artery (HCC) 10/20/2013  . Bradycardia 10/20/2013  . Vitamin B12 deficiency 07/16/2013  . Paresthesia of both feet  05/17/2013  . TMJ arthritis 09/11/2012  . Ear pain 09/11/2012  . History of transfusion of packed red blood cells 09/11/2012  . Spinal stenosis 04/13/2011  . Well adult exam 01/23/2011  . Cholecystitis with cholelithiasis, s/p lap chole 11/10/2010 12/01/2010  . Shoulder pain, right 07/25/2010  . TOBACCO USE, QUIT 03/09/2009  . KNEE PAIN 05/18/2008  . PERIPHERAL NEUROPATHY 08/19/2007  . LEG PAIN 08/19/2007  . Vitamin D deficiency 02/17/2007  . INSOMNIA, PERSISTENT 02/17/2007  . Essential hypertension 02/17/2007  . GERD 02/17/2007  . OSTEOARTHRITIS 02/17/2007  . LOW BACK PAIN 02/17/2007  . Hyperglycemia 01/28/2007  . UNSPECIFIED OSTEOPOROSIS 01/28/2007  . PRECIPITOUS DROP IN HEMATOCRIT 01/28/2007    Past Surgical History:  Procedure Laterality Date  . CEREBRAL ANEURYSM REPAIR     initial repair in the 1980s, with second surgery in the 1990s  . CHOLECYSTECTOMY    . JOINT REPLACEMENT    . KNEE ARTHROPLASTY  04/2009   Left  . TOTAL KNEE ARTHROPLASTY    . TUBAL LIGATION      OB History    No data available       Home Medications    Prior to Admission medications   Medication Sig Start Date End Date Taking? Authorizing Provider  hydrocortisone cream 1 % Apply 1 application topically as needed (Irritation).    Yes [provider]  Polyethyl Glycol-Propyl Glycol (SYSTANE) 0.4-0.3 % SOLN Place 1 drop into both eyes 2 (two) times daily.   Yes [provider]  sulindac (CLINORIL) 150 MG tablet Take 150 mg by mouth every other day.  04/03/16  Yes [provider]  vitamin B-12 (CYANOCOBALAMIN) 1000 MCG tablet Take 1,000 mcg by mouth daily.   Yes [provider]    Family History Family History  Problem Relation Age of Onset  . Arthritis Mother   . Stroke Mother   . Stroke Father   . Arthritis-Osteo Son     Social History Social History  Substance Use Topics  . Smoking status: Former Research scientist (life sciences)  . Smokeless tobacco: Not on file      Comment: quit in 1983  . Alcohol use No     Allergies   Gabapentin; Lyrica [pregabalin]; Penicillins; and Vit d-vit e-safflower oil   Review of Systems Review of Systems  Gastrointestinal: Positive for nausea.  Musculoskeletal: Negative for back pain and neck pain.  Skin: Positive for wound.  Neurological: Positive for dizziness.     Physical Exam Updated Vital Signs BP (!) 192/80   Pulse 73   Temp 97.8 F (36.6 C) (Oral)   Resp 16   Ht 5\' 2"  (1.575 m)   Wt 72.6 kg (160 lb)   SpO2 99%   BMI 29.26 kg/m   Physical Exam  Constitutional: She is oriented to person, place, and time. She appears well-developed and well-nourished.  Elderly frail-appearing  HENT:  Head: Normocephalic. Head is without raccoon's eyes and without Battle's sign.  Mouth/Throat: Oropharynx is clear and moist and mucous membranes are normal.  Moderately large hematoma to the posterior scalp with overlying open laceration and active bleeding.  Eyes: Conjunctivae, EOM and lids are normal. Pupils are equal, round, and reactive to light.  Neck: Full passive range of motion without pain. No spinous process tenderness and no muscular tenderness present.  Full flexion and extension and lateral movement of neck without any pain or difficulty. No midline bony tenderness. No muscular tenderness. No deformities or step-offs noted.  Cardiovascular: Normal rate, regular rhythm, normal heart sounds and normal pulses.  Exam reveals no gallop and no friction rub.   No murmur heard. Pulmonary/Chest: Effort normal and breath sounds normal.  Abdominal: Soft. Normal appearance. There is no tenderness. There is no rigidity and no guarding.  Musculoskeletal: Normal range of motion.  No tenderness to palpation to bilateral lower extremities are bilateral upper extremity. Noabrasions or hematomas. Range of motion of lower legs and upper arms.  Neurological: She is alert and oriented to person, place, and time. GCS eye  subscore is 4. GCS verbal subscore is 5. GCS motor subscore is 6.  Cranial nerves III-XII intact Follows commands, Moves all extremities  5/5 strength to BUE and BLE  Sensation intact throughout  Normal finger to nose. No dysdiadochokinesia. No pronator drift. No slurred speech. No facial droop.   Skin: Skin is warm and dry. Capillary refill takes less than 2 seconds.  Psychiatric: She has a normal mood and affect. Her speech is normal.  Nursing note and vitals reviewed.    ED Treatments / Results  Labs (all labs ordered are listed, but only abnormal results are displayed) Labs Reviewed  CBC WITH DIFFERENTIAL/PLATELET  BASIC METABOLIC PANEL    EKG  EKG Interpretation None       Radiology Ct Head Wo Contrast  Result Date: 07/13/2016 CLINICAL DATA:  83 year old female status post fall with posterior head  injury. EXAM: CT HEAD WITHOUT CONTRAST TECHNIQUE: Contiguous axial images were obtained from the base of the skull through the vertex without intravenous contrast. COMPARISON:  Head CT without contrast 06/19/2013. FINDINGS: Brain: Stable cerebral volume since 2015. No midline shift, ventriculomegaly, mass effect, evidence of mass lesion, intracranial hemorrhage or evidence of cortically based acute infarction. Gray-white matter differentiation is within normal limits throughout the brain. Mild posterior left cerebellar hemisphere encephalomalacia is stable. Stable gray-white matter differentiation throughout the brain. Vascular: Calcified atherosclerosis at the skull base. 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 Skull: Nondepressed linear skull fracture extends from the postoperative left occiput craniotomy margin cephalad toward the vertex. Bilateral occipital and suboccipital postoperative changes to the skull are otherwise stable since 2015. No other No acute osseous  abnormality identified. Sinuses/Orbits: Chronic right sphenoid sinusitis with interval increased opacification. Other visible paranasal sinuses and mastoids are stable and well pneumatized. Other: Broad-based left posterior convexity scalp hematoma measuring up to 17 mm in thickness. Underlying nondepressed left parietal bone fracture (series 4, image 42). Other visible scalp and orbits soft tissues are stable, including postoperative suboccipital changes. IMPRESSION: 1. Nondepressed left posterior skull fracture with overlying broad-based scalp hematoma. 2. No associated intracranial hemorrhage or acute traumatic injury to the brain identified. 3. Stable underlying chronic postoperative changes to the posterior fossa with distal left vertebral artery region aneurysm clipping. Electronically Signed   By: Genevie Ann M.D.   On: 07/13/2016 14:37    Procedures .Marland KitchenLaceration Repair Date/Time: 07/13/2016 3:30 PM Performed by: Providence Lanius A Authorized by: Providence Lanius A   Consent:    Consent obtained:  Verbal   Consent given by:  Patient   Risks discussed:  Infection Anesthesia (see MAR for exact dosages):    Anesthesia method:  Local infiltration   Local anesthetic:  Lidocaine 1% WITH epi Laceration details:    Location:  Scalp   Scalp location:  Occipital Repair type:    Repair type:  Simple Pre-procedure details:    Preparation:  Patient was prepped and draped in usual sterile fashion Treatment:    Area cleansed with:  Betadine   Amount of cleaning:  Extensive   Irrigation method:  Syringe Skin repair:    Repair method:  Staples   Number of staples:  2 Approximation:    Approximation:  Close   V[place] border: well-aligned   Post-procedure details:    Patient tolerance of procedure:  Tolerated well, no immediate complications   (including critical care time)  Medications Ordered in ED Medications  bacitracin ointment ( Topical Given 07/13/16 1455)  Tdap (BOOSTRIX) injection 0.5  mL (0.5 mLs Intramuscular Given 07/13/16 1350)  lidocaine-EPINEPHrine (XYLOCAINE W/EPI) 2 %-1:200000 (PF) injection 10 mL (10 mLs Intradermal Given 07/13/16 1352)     Initial Impression / Assessment and Plan / ED Course  I have reviewed the triage vital signs and the nursing notes.  Pertinent labs & imaging results that were available during my care of the patient were reviewed by me and considered in my medical decision making (see chart for details).  Clinical Course as of Jul 14 904  Fri Jul 13, 2016  1707 Reassessed patient. She is eating and drinking in ED without complications. SBP has been trending up 188-250 in ED. Had long discussion about elevated BP with patient. She declined antihypertensives, requests 15 more minutes to decide. Will reassess SBP and likely give antihypertensive.   [CG]  [place] to hospitalist who requested control of SBP and re-page. Patient continues to refuse antihypertensives. Had a second discussion about possible complications given elevated SBP h/o aneurysm and recent head trauma including bleeding, stroke, death. Patient finally agreed to take hypertensive. Will control BP and re-request admission  [CG]    Clinical Course User Index [CG] Kinnie Feil, PA-C    83 year old female who presents after a mechanical fall earlier this morning. Patient has a history of aneurysm on the left side that has been clipped. No neuro deficits on exam. She does have a large hematoma to the posterior scalp with overlying laceration. Given patient's clinical findings and history of aneurysm, will obtain CT head to evaluate for any findings of acute hemorrhage. Offered pain medications to patient that she denies at this time. Will plan to provide wound care to posterior head. Will plan to repair laceration with staples. Patient states that she does not recall when her last tetanus shot was. Will plan to give her booster here in the department.  Laceration repaired as  admitted above. Patient tolerated procedure well.   CT head reviewed. Shows nondepressed left skull fracture with overlying hematoma. No acute intracranial hemorrhage.  Discussed with patient. Patient reports that she is hesitant to ambulate since she feels unsteady. She concerned with going home since she lives alone and has nobody that can stay with her overnight. Given that patient has a skull fracture on exam and has been mildly symptomatic in the department, will plan to admit for further evaluation. Neurosurgery consult.  Discussed with Lyndle Herrlich, neurosurgery PA. Aware of patient and agrees with admission. Recommends pain control during hospital stay. Will plan to have outpatient neurosurgery evaluation in 4 weeks.  Final Clinical Impressions(s) / ED Diagnoses   Final diagnoses:  Laceration of scalp, initial encounter  Hematoma    New Prescriptions New Prescriptions   No medications on file     Volanda Napoleon, PA-C 07/14/16 [id number]    Volanda Napoleon, PA-C 07/14/16 6834    Duffy Bruce, MD 07/15/16 [phone]

## 2016-07-14 ENCOUNTER — Observation Stay (HOSPITAL_BASED_OUTPATIENT_CLINIC_OR_DEPARTMENT_OTHER): Payer: Medicare Other

## 2016-07-14 ENCOUNTER — Encounter (HOSPITAL_COMMUNITY): Payer: Self-pay

## 2016-07-14 DIAGNOSIS — R9431 Abnormal electrocardiogram [ECG] [EKG]: Secondary | ICD-10-CM

## 2016-07-14 DIAGNOSIS — S0291XA Unspecified fracture of skull, initial encounter for closed fracture: Secondary | ICD-10-CM | POA: Diagnosis not present

## 2016-07-14 DIAGNOSIS — S0101XA Laceration without foreign body of scalp, initial encounter: Secondary | ICD-10-CM | POA: Diagnosis not present

## 2016-07-14 DIAGNOSIS — I10 Essential (primary) hypertension: Secondary | ICD-10-CM | POA: Diagnosis not present

## 2016-07-14 DIAGNOSIS — K219 Gastro-esophageal reflux disease without esophagitis: Secondary | ICD-10-CM

## 2016-07-14 DIAGNOSIS — S020XXA Fracture of vault of skull, initial encounter for closed fracture: Secondary | ICD-10-CM | POA: Diagnosis not present

## 2016-07-14 DIAGNOSIS — E119 Type 2 diabetes mellitus without complications: Secondary | ICD-10-CM | POA: Diagnosis not present

## 2016-07-14 LAB — CBC
HCT: 35 % — ABNORMAL LOW (ref 36.0–46.0)
Hemoglobin: 11.4 g/dL — ABNORMAL LOW (ref 12.0–15.0)
MCH: 29.2 pg (ref 26.0–34.0)
MCHC: 32.6 g/dL (ref 30.0–36.0)
MCV: 89.5 fL (ref 78.0–100.0)
Platelets: 160 10*3/uL (ref 150–400)
RBC: 3.91 MIL/uL (ref 3.87–5.11)
RDW: 12.9 % (ref 11.5–15.5)
WBC: 8.3 10*3/uL (ref 4.0–10.5)

## 2016-07-14 LAB — COMPREHENSIVE METABOLIC PANEL
ALT: 13 U/L — ABNORMAL LOW (ref 14–54)
AST: 18 U/L (ref 15–41)
Albumin: 3.4 g/dL — ABNORMAL LOW (ref 3.5–5.0)
Alkaline Phosphatase: 75 U/L (ref 38–126)
Anion gap: 9 (ref 5–15)
BUN: 18 mg/dL (ref 6–20)
CO2: 23 mmol/L (ref 22–32)
Calcium: 9.2 mg/dL (ref 8.9–10.3)
Chloride: 105 mmol/L (ref 101–111)
Creatinine, Ser: 0.79 mg/dL (ref 0.44–1.00)
GFR calc Af Amer: >60 mL/min (ref >60)
GFR calc non Af Amer: >60 mL/min (ref >60)
Glucose, Bld: 117 mg/dL — ABNORMAL HIGH (ref 65–99)
Potassium: 3.9 mmol/L (ref 3.5–5.1)
Sodium: 137 mmol/L (ref 135–145)
Total Bilirubin: 0.8 mg/dL (ref 0.3–1.2)
Total Protein: 5.7 g/dL — ABNORMAL LOW (ref 6.5–8.1)

## 2016-07-14 LAB — PHOSPHORUS: Phosphorus: 3.2 mg/dL (ref 2.5–4.6)

## 2016-07-14 LAB — ECHOCARDIOGRAM COMPLETE
Height: 62 in
Weight: 2560 oz

## 2016-07-14 LAB — GLUCOSE, CAPILLARY
Glucose-Capillary: 119 mg/dL — ABNORMAL HIGH (ref 65–99)
Glucose-Capillary: 142 mg/dL — ABNORMAL HIGH (ref 65–99)

## 2016-07-14 LAB — MAGNESIUM: Magnesium: 1.8 mg/dL (ref 1.7–2.4)

## 2016-07-14 MED ORDER — ACETAMINOPHEN 325 MG PO TABS: 650.0000 mg | ORAL_TABLET | Freq: Four times a day (QID) | ORAL | 0 refills | Status: DC | PRN

## 2016-07-14 NOTE — Progress Notes (Signed)
Patient left unit by wheelchair accompanied by staff.

## 2016-07-14 NOTE — Care Management Note (Signed)
Case Management Note  Patient Details  Name: Kristina Wilkinson MRN: [id number] Date of Birth: [birth year]  Subjective/Objective:                 Spoke with patient. She would like to use [facility] for [facility]. Referral placed to [facility] clinical liaison Jermaine. No other CM needs identified.    Action/Plan:  DC to home w [place].  Expected Discharge Date:  07/14/16               Expected Discharge Plan:  [place]  In-House Referral:     Discharge planning Services  CM Consult  Post Acute Care Choice:  Home Health Choice offered to:  Patient  DME Arranged:    DME Agency:     HH Arranged:  RN, PT, Nurse's Aide [place] Agency:  [place]  Status of Service:  Completed, signed off  If discussed at [place] of Stay Meetings, dates discussed:    Additional Comments:  Carles Collet, RN 07/14/2016, 10:07 AM

## 2016-07-14 NOTE — Progress Notes (Signed)
  Echocardiogram 2D Echocardiogram has been performed.  Kristina Wilkinson 07/14/2016, 11:38 AM

## 2016-07-14 NOTE — Consult Note (Signed)
CC:  Chief Complaint  Patient presents with  . Fall    HPI: Kristina Wilkinson is a 83 y.o. female who presented to ER yesterday after mechnical fall. She was leaning up against golf cart when it was moved causing her to fall and strike back of head. Denies LOC. Remembers entire event. Had a headache, nausea and dizziness at onset, but resolved at this point. Feels well this am. No complaints. Denies neurological symptoms. Does have a history of rupture aneurysm - managed by [facility]. Not on anticoagulation.   PMH: Past Medical History:  Diagnosis Date  . Arthritis   . CNS aneurysm 1983   s/p clipping  . Diabetes mellitus type II    diet controlled   . DVT (deep venous thrombosis) ([place]) 10/15  . Elevated glucose   . Hypertension   . Insomnia   . Knee pain    Left Dr. Meyer Cory  . LBP (low back pain)   . PN (peripheral neuropathy)    unknown etiol    PSH: Past Surgical History:  Procedure Laterality Date  . CEREBRAL ANEURYSM REPAIR     initial repair in the 1980s, with second surgery in the 1990s  . CHOLECYSTECTOMY    . JOINT REPLACEMENT    . KNEE ARTHROPLASTY  04/2009   Left  . TOTAL KNEE ARTHROPLASTY    . TUBAL LIGATION      SH: Social History  Substance Use Topics  . Smoking status: Former Research scientist (life sciences)  . Smokeless tobacco: Not on file     Comment: quit in 1983  . Alcohol use No    MEDS: Prior to Admission medications   Medication Sig Start Date End Date Taking? Authorizing Provider  hydrocortisone cream 1 % Apply 1 application topically as needed (Irritation).    Yes [provider]  Polyethyl Glycol-Propyl Glycol (SYSTANE) 0.4-0.3 % SOLN Place 1 drop into both eyes 2 (two) times daily.   Yes [provider]  sulindac (CLINORIL) 150 MG tablet Take 150 mg by mouth every other day.  04/03/16  Yes [provider]  vitamin B-12 (CYANOCOBALAMIN) 1000 MCG tablet Take 1,000 mcg by mouth daily.   Yes [provider]     ALLERGY: Allergies  Allergen Reactions  . Gabapentin     REACTION: numb legs  . Lyrica [Pregabalin] Other (See Comments)    REACTION: makes me pass out  . Penicillins     Has patient had a PCN reaction causing immediate rash, facial/tongue/throat swelling, SOB or lightheadedness with hypotension: Unknown Has patient had a PCN reaction causing severe rash involving mucus membranes or skin necrosis: Unknown Has patient had a PCN reaction that required hospitalization: Unknown Has patient had a PCN reaction occurring within the last 10 years: Yes If all of the above answers are "NO", then may proceed with Cephalosporin use.   Jearl Klinefelter D-Vit E-Safflower Oil Other (See Comments)    Achy, makes sick    ROS: Review of Systems  Constitutional: Negative.   HENT: Negative.   Eyes: Negative.   Respiratory: Negative.   Cardiovascular: Negative.   Gastrointestinal: Negative.   Genitourinary: Negative.   Musculoskeletal: Negative.   Skin: Negative.   Neurological: Negative for dizziness, tingling, tremors, sensory change, speech change, focal weakness, seizures, loss of consciousness and headaches.  Psychiatric/Behavioral: Negative.     Vitals:   07/14/16 0251 07/14/16 0500  BP: (!) 184/51 (!) 158/60  Pulse: 70 73  Resp:  18  Temp:  97.9 F (  36.6 C)   General appearance: WDWN, resting comfortably, NAD Eyes: PERRL Cardiovascular: Regular rate and rhythm without murmurs, rubs, gallops. No edema or variciosities. Distal pulses normal. Pulmonary: Clear to auscultation Musculoskeletal:     Muscle tone upper extremities: Normal    Muscle tone lower extremities: Normal    Motor exam: Upper Extremities Deltoid Bicep Tricep Grip  Right 5/5 5/5 5/5 5/5  Left 5/5 5/5 5/5 5/5   Lower Extremity IP Quad PF DF EHL  Right 5/5 5/5 5/5 5/5 5/5  Left 5/5 5/5 5/5 5/5 5/5   Neurological Awake, alert, oriented Memory and concentration grossly intact Speech fluent, appropriate CNII: Visual  fields normal CNIII/IV/VI: EOMI CNV: Facial sensation normal CNVII: Symmetric, normal strength CNVIII: Grossly normal CNIX: Normal palate movement CNXI: Trap and SCM strength normal CN XII: Tongue protrusion normal Sensation grossly intact to LT Coordination (finger/nose & heel/shin): Normal  IMAGING: CT HEAD IMPRESSION: 1. Nondepressed left posterior skull fracture with overlying broad-based scalp hematoma. 2. No associated intracranial hemorrhage or acute traumatic injury to the brain identified. 3. Stable underlying chronic postoperative changes to the posterior fossa with distal left vertebral artery region aneurysm clipping.  IMPRESSION/PLAN - 83 y.o. female with nondepressed posterior skull fracture. She is neurologically intact and without deficits. She is without any neurological symptoms. I have reviewed the case with Dr Cyndy Freeze who has also reviewed the imaging. No NS intervention indicated. Should heal on its own. F/U outpt in 4  Weeks for repeat head CT. Red flag symptoms were discussed for when  To seek urgent medical attention.

## 2016-07-14 NOTE — Discharge Summary (Signed)
Kristina Wilkinson LDJ:[id number] DOB: [birth year] DOA: 07/13/2016  PCP: Tonia Ghent, MD  Admit date: 07/13/2016  Discharge date: 07/14/2016  Admitted From: Home   Disposition:  Home   Recommendations for Outpatient Follow-up:   Follow up with PCP in 1-2 weeks  PCP Please obtain BMP/CBC, 2 view CXR in 1week,  (see Discharge instructions)   PCP Please follow up on the following pending results: None   Home Health: PT, RN   Equipment/Devices: None  Consultations: N.Surg Discharge Condition: Stable   CODE STATUS: Full   Diet Recommendation: Diet heart healthy/carb modified    Chief Complaint  Patient presents with  . Fall     Brief history of present illness from the day of admission and additional interim summary    Kristina Wilkinson is a 83 y.o. female with medical history significant of osteoarthritis, CNS aneurysm with S/P clipping, DVT, type 2 diabetes, insomnia, hypertension, chronic lower back pain, vitamin B12 deficiency, peripheral neuropathy who is coming to the emergency department after having a fall, hitting the occipital part of her skull and sustaining a laceration with hematoma.                                                                  Hospital Course     Mechanical fall sustaining a nondepressed skull fracture, left occipital scalp laceration and a small hematoma. Stable, no focal deficits, no headache, seen by neurosurgery, discharge home on Tylenol when necessary with PCP follow-up within 3-4 days. Request PCP to monitor laceration site closely. Note this was a purely mechanical fall where patient was resting against her golf cart which someone moved accidentally resulting which she fell.   Peripheral neuropathy. Continue home regimen follow with PCP.   Essential hypertension currently  off medications will follow with PCP for the same.    Discharge diagnosis     Principal Problem:   Skull fracture ([place]) Active Problems:   PN (peripheral neuropathy)   Essential hypertension   GERD   Diabetes mellitus without complication (HCC)   Scalp laceration    Discharge instructions    Discharge Instructions    Discharge instructions    Complete by:  As directed    Follow with Primary MD Tonia Ghent, MD in 3 days   Get CBC, CMP, 2 view Chest X ray checked  by Primary MD or SNF MD in 3 days ( we routinely change or add medications that can affect your baseline labs and fluid status, therefore we recommend that you get the mentioned basic workup next visit with your PCP, your PCP may decide not to get them or add new tests based on their clinical decision)  Activity: As tolerated with Full fall precautions use walker/cane & assistance as needed  Disposition Home  Diet:   Diet heart healthy/carb modified .  For Heart failure patients - Check your Weight same time everyday, if you gain over 2 pounds, or you develop in leg swelling, experience more shortness of breath or chest pain, call your Primary MD immediately. Follow Cardiac Low Salt Diet and 1.5 lit/day fluid restriction.  On your next visit with your primary care physician please Get Medicines reviewed and adjusted.  Please request your Prim.MD to go over all Hospital Tests and Procedure/Radiological results at the follow up, please get all Hospital records sent to your Prim MD by signing hospital release before you go home.  If you experience worsening of your admission symptoms, develop shortness of breath, life threatening emergency, suicidal or homicidal thoughts you must seek medical attention immediately by calling 911 or calling your MD immediately  if symptoms less severe.  You Must read complete instructions/literature along with all the possible adverse reactions/side effects for all the Medicines you  take and that have been prescribed to you. Take any new Medicines after you have completely understood and accpet all the possible adverse reactions/side effects.   Do not drive, operate heavy machinery, perform activities at heights, swimming or participation in water activities or provide baby sitting services if your were admitted for syncope or siezures until you have seen by Primary MD or a Neurologist and advised to do so again.  Do not drive when taking Pain medications.    Do not take more than prescribed Pain, Sleep and Anxiety Medications  Special Instructions: If you have smoked or chewed Tobacco  in the last 2 yrs please stop smoking, stop any regular Alcohol  and or any Recreational drug use.  Wear Seat belts while driving.   Please note  You were cared for by a hospitalist during your hospital stay. If you have any questions about your discharge medications or the care you received while you were in the hospital after you are discharged, you can call the unit and asked to speak with the hospitalist on call if the hospitalist that took care of you is not available. Once you are discharged, your primary care physician will handle any further medical issues. Please note that NO REFILLS for any discharge medications will be authorized once you are discharged, as it is imperative that you return to your primary care physician (or establish a relationship with a primary care physician if you do not have one) for your aftercare needs so that they can reassess your need for medications and monitor your lab values.   Increase activity slowly    Complete by:  As directed       Discharge Medications   Allergies as of 07/14/2016      Reactions   Gabapentin    REACTION: numb legs   Lyrica [pregabalin] Other (See Comments)   REACTION: makes me pass out   Penicillins    Has patient had a PCN reaction causing immediate rash, facial/tongue/throat swelling, SOB or lightheadedness with  hypotension: Unknown Has patient had a PCN reaction causing severe rash involving mucus membranes or skin necrosis: Unknown Has patient had a PCN reaction that required hospitalization: Unknown Has patient had a PCN reaction occurring within the last 10 years: Yes If all of the above answers are "NO", then may proceed with Cephalosporin use.   Vit D-vit E-safflower Oil Other (See Comments)   Achy, makes sick      Medication List    TAKE these medications   acetaminophen 325  MG tablet Commonly known as:  TYLENOL Take 2 tablets (650 mg total) by mouth every 6 (six) hours as needed for headache.   hydrocortisone cream 1 % Apply 1 application topically as needed (Irritation).   sulindac 150 MG tablet Commonly known as:  CLINORIL Take 150 mg by mouth every other day.   SYSTANE 0.4-0.3 % Soln Generic drug:  Polyethyl Glycol-Propyl Glycol Place 1 drop into both eyes 2 (two) times daily.   vitamin B-12 1000 MCG tablet Commonly known as:  CYANOCOBALAMIN Take 1,000 mcg by mouth daily.       Follow-up Information    Tonia Ghent, MD. Schedule an appointment as soon as possible for a visit in 3 week(s).   Specialty:  Family Medicine Contact information: [place] [address] [phone]        Ditty, Kevan Ny, MD. Schedule an appointment as soon as possible for a visit in 1 week(s).   Specialty:  Neurosurgery Contact information: [place] [address] [phone]           Major procedures and Radiology Reports - PLEASE review detailed and final reports thoroughly  -         Ct Head Wo Contrast  Result Date: 07/13/2016 CLINICAL DATA:  83 year old female status post fall with posterior head injury. EXAM: CT HEAD WITHOUT CONTRAST TECHNIQUE: Contiguous axial images were obtained from the base of the skull through the vertex without intravenous contrast. COMPARISON:  Head CT without contrast 06/19/2013.  FINDINGS: Brain: Stable cerebral volume since 2015. No midline shift, ventriculomegaly, mass effect, evidence of mass lesion, intracranial hemorrhage or evidence of cortically based acute infarction. Gray-white matter differentiation is within normal limits throughout the brain. Mild posterior left cerebellar hemisphere encephalomalacia is stable. Stable gray-white matter differentiation throughout the brain. Vascular: Calcified atherosclerosis at the skull base. 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 Skull: Nondepressed linear skull fracture extends from the postoperative left occiput craniotomy margin cephalad toward the vertex. Bilateral occipital and suboccipital postoperative changes to the skull are otherwise stable since 2015. No other No acute osseous abnormality identified. Sinuses/Orbits: Chronic right sphenoid sinusitis with interval increased opacification. Other visible paranasal sinuses and mastoids are stable and well pneumatized. Other: Broad-based left posterior convexity scalp hematoma measuring up to 17 mm in thickness. Underlying nondepressed left parietal bone fracture (series 4, image 42). Other visible scalp and orbits soft tissues are stable, including postoperative suboccipital changes. IMPRESSION: 1. Nondepressed left posterior skull fracture with overlying broad-based scalp hematoma. 2. No associated intracranial hemorrhage or acute traumatic injury to the brain identified. 3. Stable underlying chronic postoperative changes to the posterior fossa with distal left vertebral artery region aneurysm clipping. Electronically Signed   By: Genevie Ann M.D.   On: 07/13/2016 14:37    Micro Results     No results found for this or any previous visit (from the past 240 hour(s)).  Today   Subjective    Keishla Gortney today has no headache,no chest abdominal pain,no new weakness  tingling or numbness, feels much better wants to go home today.    Objective   Blood pressure (!) 158/60, pulse 73, temperature 97.9 F (36.6 C), temperature source Oral, resp. rate 18, height 5\' 2"  (1.575 m), weight 72.6 kg (160 lb), SpO2 98 %.  No intake or output data in the 24 hours ending 07/14/16 0954  Exam  Awake Alert, Oriented x 3, No new F.N deficits, Normal affect [place].AT,PERRAL Supple Neck,No JVD, No cervical lymphadenopathy appriciated.  Symmetrical Chest wall movement, Good air movement bilaterally, CTAB RRR,No Gallops,Rubs or new Murmurs, No Parasternal Heave +ve B.Sounds, Abd Soft, Non tender, No organomegaly appriciated, No rebound -guarding or rigidity. No Cyanosis, Clubbing or edema, No new Rash or bruise, L post Scalp laceration site stable   Data Review   CBC w Diff:  Lab Results  Component Value Date   WBC 8.3 07/14/2016   HGB 11.4 (L) 07/14/2016   HCT 35.0 (L) 07/14/2016   PLT 160 07/14/2016   LYMPHOPCT 12 07/13/2016   MONOPCT 6 07/13/2016   EOSPCT 0 07/13/2016   BASOPCT 0 07/13/2016    CMP:  Lab Results  Component Value Date   NA 137 07/14/2016   K 3.9 07/14/2016   CL 105 07/14/2016   CO2 23 07/14/2016   BUN 18 07/14/2016   CREATININE 0.79 07/14/2016   PROT 5.7 (L) 07/14/2016   ALBUMIN 3.4 (L) 07/14/2016   BILITOT 0.8 07/14/2016   ALKPHOS 75 07/14/2016   AST 18 07/14/2016   ALT 13 (L) 07/14/2016  .   Total Time in preparing paper work, data evaluation and todays exam - 37 minutes  Lala Lund M.D on 07/14/2016 at 9:54 AM  [facility]   Office  [phone]

## 2016-07-14 NOTE — Progress Notes (Signed)
Pt states she feels "pressure" on central upper quadrant of abdomen from BP med (hydralazine), relieve by going to urinate with increased frequency.

## 2016-07-14 NOTE — Discharge Instructions (Signed)
Head Injury, Adult There are many types of head injuries. They can be as minor as a bump. Some head injuries can be worse. Worse injuries include:  A strong hit to the head that hurts the brain (concussion).  A bruise of the brain (contusion). This means there is bleeding in the brain that can cause swelling.  A cracked skull (skull fracture).  Bleeding in the brain that gathers, gets thick (makes a clot), and forms a bump (hematoma).  Most problems from a head injury come in the first 24 hours. However, you may still have side effects up to 7-10 days after your injury. It is important to watch your condition for any changes. Follow these instructions at home: Activity  Rest as much as possible.  Avoid activities that are hard or tiring.  Make sure you get enough sleep.  Limit activities that need a lot of thought or attention, such as: ? Watching TV. ? Playing memory games and puzzles. ? Job-related work or homework. ? Working on [employer], and texting.  Avoid activities that could cause another head injury until your doctor says it is okay. This includes playing sports.  Ask your doctor when it is safe for you to go back to your normal activities, such as work or school. Ask your doctor for a step-by-step plan for slowly going back to your normal activities.  Ask your doctor when you can drive, ride a bicycle, or use heavy machinery. Never do these activities if you are dizzy.  Lifestyle  Do not drink alcohol until your doctor says it is okay.  Avoid drug use.  If it is harder than usual to remember things, write them down.  If you are easily distracted, try to do one thing at a time.  Talk with family members or close friends when making important decisions.  Tell your friends, family, a trusted coworker, and work Freight forwarder about your injury, symptoms, and limits (restrictions). Have them watch for any problems that are new or getting  worse.  General instructions  Take over-the-counter and prescription medicines only as told by your doctor.  Have someone stay with you for 24 hours after your head injury. This person should watch you for any changes in your symptoms and be ready to get help.  Keep all follow-up visits as told by your doctor. This is important.  Prevention  Work on [employer]. This can help you avoid falls.  Wear a seatbelt when you are in a moving vehicle.  Wear a helmet when: ? Riding a bicycle. ? Skiing. ? Doing any other sport or activity that has a risk of injury.  Drink alcohol only in moderation.  Make your home safer by: ? Getting rid of clutter from the floors and stairs, like things that can make you trip. ? Using grab bars in bathrooms and handrails by stairs. ? Placing non-slip mats on floors and in bathtubs. ? Putting more light in dim areas. Get help right away if:  You have: ? A very bad (severe) headache that is not helped by medicine. ? Trouble walking or weakness in your arms and legs. ? Clear or bloody fluid coming from your nose or ears. ? Changes in your seeing (vision). ? Jerky movements that you cannot control (seizure).  You throw up (vomit).  Your symptoms get worse.  You lose balance.  Your speech is slurred.  You pass out.  You are sleepier and have trouble staying awake.  The black centers of your eyes (pupils) change in size. These symptoms may be an emergency. Do not wait to see if the symptoms will go away. Get medical help right away. Call your local emergency services (911 in the [place] Do not drive yourself to the hospital. This information is not intended to replace advice given to you by your health care provider. Make sure you discuss any questions you have with your health care provider. Document Released: 01/12/2008 Document Revised: 08/25/2015 Document Reviewed: 08/09/2015 Elsevier Interactive Patient Education  2017  [place]. Follow with Primary MD Tonia Ghent, MD in 3 days   Get CBC, CMP, 2 view Chest X ray checked  by Primary MD or SNF MD in 3 days ( we routinely change or add medications that can affect your baseline labs and fluid status, therefore we recommend that you get the mentioned basic workup next visit with your PCP, your PCP may decide not to get them or add new tests based on their clinical decision)  Activity: As tolerated with Full fall precautions use walker/cane & assistance as needed  Disposition Home   Diet:   Diet heart healthy/carb modified .  For Heart failure patients - Check your Weight same time everyday, if you gain over 2 pounds, or you develop in leg swelling, experience more shortness of breath or chest pain, call your Primary MD immediately. Follow Cardiac Low Salt Diet and 1.5 lit/day fluid restriction.  On your next visit with your primary care physician please Get Medicines reviewed and adjusted.  Please request your Prim.MD to go over all Hospital Tests and Procedure/Radiological results at the follow up, please get all Hospital records sent to your Prim MD by signing hospital release before you go home.  If you experience worsening of your admission symptoms, develop shortness of breath, life threatening emergency, suicidal or homicidal thoughts you must seek medical attention immediately by calling 911 or calling your MD immediately  if symptoms less severe.  You Must read complete instructions/literature along with all the possible adverse reactions/side effects for all the Medicines you take and that have been prescribed to you. Take any new Medicines after you have completely understood and accpet all the possible adverse reactions/side effects.   Do not drive, operate heavy machinery, perform activities at heights, swimming or participation in water activities or provide baby sitting services if your were admitted for syncope or siezures until you have seen  by Primary MD or a Neurologist and advised to do so again.  Do not drive when taking Pain medications.    Do not take more than prescribed Pain, Sleep and Anxiety Medications  Special Instructions: If you have smoked or chewed Tobacco  in the last 2 yrs please stop smoking, stop any regular Alcohol  and or any Recreational drug use.  Wear Seat belts while driving.   Please note  You were cared for by a hospitalist during your hospital stay. If you have any questions about your discharge medications or the care you received while you were in the hospital after you are discharged, you can call the unit and asked to speak with the hospitalist on call if the hospitalist that took care of you is not available. Once you are discharged, your primary care physician will handle any further medical issues. Please note that NO REFILLS for any discharge medications will be authorized once you are discharged, as it is imperative that you return to your primary care physician (or establish a  relationship with a primary care physician if you do not have one) for your aftercare needs so that they can reassess your need for medications and monitor your lab values.

## 2016-07-14 NOTE — Progress Notes (Signed)
Discharge instructions given to patient. All questions and concerns addressed.

## 2016-07-15 ENCOUNTER — Other Ambulatory Visit: Payer: Self-pay | Admitting: Family Medicine

## 2016-07-15 DIAGNOSIS — E119 Type 2 diabetes mellitus without complications: Secondary | ICD-10-CM

## 2016-07-15 LAB — HEMOGLOBIN A1C
Hgb A1c MFr Bld: >15.5 % — ABNORMAL HIGH (ref 4.8–5.6)
Mean Plasma Glucose: >398 mg/dL

## 2016-07-25 ENCOUNTER — Telehealth: Payer: Self-pay

## 2016-07-25 NOTE — Telephone Encounter (Signed)
Scarlette nurse with [facility] notified as instructed by telephone and verbalized understanding. Scarlette stated that the patient told her also that she is not seeing Dr. Damita Dunnings any more. Scarlette stated that she will contact the physician that patient told her to contact for orders.  Please removed Dr. Damita Dunnings as PCP per his instructions.

## 2016-07-25 NOTE — Telephone Encounter (Signed)
Per patient request, prev documented:  Notes recorded by Josetta Huddle, CMA on 07/16/2016 at 5:49 PM EDT Patient states she is not seeing Dr. Damita Dunnings as her PCP any longer.  Notes recorded by Tonia Ghent, MD on 07/16/2016 at 10:04 PM EDT Please remove me as the PCP of record immediately. I am no longer involved in her care, per her wishes. She doesn't need 30 day grace period.  Thanks. ----------------------------------------------- This needs to go to her current MD, not this clinic.   Thanks.

## 2016-07-25 NOTE — Telephone Encounter (Signed)
I removed Dr.Duncan as PCP.

## 2016-07-25 NOTE — Telephone Encounter (Signed)
Scarlette nurse with [facility] left v/m requesting cb with verbal orders for home health nursing eval and PT. Scarlette said pt was discharged from hospital recently and hospital had [facility] to go out but pt said [facility] advised pt they could not do anything to help pt. Pt contacted [facility] to get assistance in home. Please advise.

## 2016-07-27 DIAGNOSIS — E119 Type 2 diabetes mellitus without complications: Secondary | ICD-10-CM | POA: Diagnosis not present

## 2016-07-27 DIAGNOSIS — Z4802 Encounter for removal of sutures: Secondary | ICD-10-CM | POA: Diagnosis not present

## 2016-07-27 DIAGNOSIS — S0083XD Contusion of other part of head, subsequent encounter: Secondary | ICD-10-CM | POA: Diagnosis not present

## 2016-07-27 DIAGNOSIS — I10 Essential (primary) hypertension: Secondary | ICD-10-CM | POA: Diagnosis not present

## 2016-07-27 DIAGNOSIS — M1990 Unspecified osteoarthritis, unspecified site: Secondary | ICD-10-CM | POA: Diagnosis not present

## 2016-07-27 DIAGNOSIS — S0291XD Unspecified fracture of skull, subsequent encounter for fracture with routine healing: Secondary | ICD-10-CM | POA: Diagnosis not present

## 2016-07-31 DIAGNOSIS — S0083XD Contusion of other part of head, subsequent encounter: Secondary | ICD-10-CM | POA: Diagnosis not present

## 2016-07-31 DIAGNOSIS — I10 Essential (primary) hypertension: Secondary | ICD-10-CM | POA: Diagnosis not present

## 2016-07-31 DIAGNOSIS — E119 Type 2 diabetes mellitus without complications: Secondary | ICD-10-CM | POA: Diagnosis not present

## 2016-07-31 DIAGNOSIS — M1990 Unspecified osteoarthritis, unspecified site: Secondary | ICD-10-CM | POA: Diagnosis not present

## 2016-07-31 DIAGNOSIS — S0291XD Unspecified fracture of skull, subsequent encounter for fracture with routine healing: Secondary | ICD-10-CM | POA: Diagnosis not present

## 2016-07-31 DIAGNOSIS — Z4802 Encounter for removal of sutures: Secondary | ICD-10-CM | POA: Diagnosis not present

## 2016-08-02 DIAGNOSIS — I10 Essential (primary) hypertension: Secondary | ICD-10-CM | POA: Diagnosis not present

## 2016-08-02 DIAGNOSIS — M1990 Unspecified osteoarthritis, unspecified site: Secondary | ICD-10-CM | POA: Diagnosis not present

## 2016-08-02 DIAGNOSIS — E119 Type 2 diabetes mellitus without complications: Secondary | ICD-10-CM | POA: Diagnosis not present

## 2016-08-02 DIAGNOSIS — S0291XD Unspecified fracture of skull, subsequent encounter for fracture with routine healing: Secondary | ICD-10-CM | POA: Diagnosis not present

## 2016-08-02 DIAGNOSIS — S0083XD Contusion of other part of head, subsequent encounter: Secondary | ICD-10-CM | POA: Diagnosis not present

## 2016-08-02 DIAGNOSIS — Z4802 Encounter for removal of sutures: Secondary | ICD-10-CM | POA: Diagnosis not present

## 2016-08-06 DIAGNOSIS — I10 Essential (primary) hypertension: Secondary | ICD-10-CM | POA: Diagnosis not present

## 2016-08-06 DIAGNOSIS — M1990 Unspecified osteoarthritis, unspecified site: Secondary | ICD-10-CM | POA: Diagnosis not present

## 2016-08-06 DIAGNOSIS — S0291XD Unspecified fracture of skull, subsequent encounter for fracture with routine healing: Secondary | ICD-10-CM | POA: Diagnosis not present

## 2016-08-06 DIAGNOSIS — E119 Type 2 diabetes mellitus without complications: Secondary | ICD-10-CM | POA: Diagnosis not present

## 2016-08-06 DIAGNOSIS — Z4802 Encounter for removal of sutures: Secondary | ICD-10-CM | POA: Diagnosis not present

## 2016-08-06 DIAGNOSIS — S0083XD Contusion of other part of head, subsequent encounter: Secondary | ICD-10-CM | POA: Diagnosis not present

## 2016-10-16 DIAGNOSIS — H401113 Primary open-angle glaucoma, right eye, severe stage: Secondary | ICD-10-CM | POA: Diagnosis not present

## 2016-11-14 ENCOUNTER — Telehealth: Payer: Self-pay | Admitting: Family Medicine

## 2016-11-14 NOTE — Telephone Encounter (Signed)
Patient said she's received several EMMI calls.  Patient said she wants to switch from [place] to [facility]. Patient said she wasn't happy with Dr.Duncan. Can patient switch?

## 2016-11-14 NOTE — Telephone Encounter (Signed)
That will be fine. 

## 2016-11-14 NOTE — Telephone Encounter (Signed)
Fine with me.  I wish this patient the best.

## 2016-11-14 NOTE — Telephone Encounter (Signed)
Patient scheduled appointment with Debbie on 11/28/16.

## 2016-11-28 ENCOUNTER — Ambulatory Visit: Payer: Medicare Other | Admitting: Family Medicine

## 2016-12-04 DIAGNOSIS — H40002 Preglaucoma, unspecified, left eye: Secondary | ICD-10-CM | POA: Diagnosis not present

## 2016-12-04 DIAGNOSIS — H401113 Primary open-angle glaucoma, right eye, severe stage: Secondary | ICD-10-CM | POA: Diagnosis not present

## 2016-12-04 DIAGNOSIS — Z961 Presence of intraocular lens: Secondary | ICD-10-CM | POA: Diagnosis not present

## 2016-12-12 ENCOUNTER — Ambulatory Visit (INDEPENDENT_AMBULATORY_CARE_PROVIDER_SITE_OTHER): Payer: Medicare Other | Admitting: Family Medicine

## 2016-12-12 ENCOUNTER — Encounter: Payer: Self-pay | Admitting: Family Medicine

## 2016-12-12 VITALS — BP 154/98 | HR 73 | Temp 97.8°F | Ht 62.0 in | Wt 149.0 lb

## 2016-12-12 DIAGNOSIS — E559 Vitamin D deficiency, unspecified: Secondary | ICD-10-CM | POA: Diagnosis not present

## 2016-12-12 DIAGNOSIS — Z7689 Persons encountering health services in other specified circumstances: Secondary | ICD-10-CM | POA: Diagnosis not present

## 2016-12-12 DIAGNOSIS — E1165 Type 2 diabetes mellitus with hyperglycemia: Secondary | ICD-10-CM

## 2016-12-12 DIAGNOSIS — I10 Essential (primary) hypertension: Secondary | ICD-10-CM

## 2016-12-12 DIAGNOSIS — Z23 Encounter for immunization: Secondary | ICD-10-CM

## 2016-12-12 LAB — LIPID PANEL
Cholesterol: 181 mg/dL (ref 0–200)
HDL: 59.4 mg/dL
LDL Cholesterol: 104 mg/dL — ABNORMAL HIGH (ref 0–99)
NonHDL: 121.72
Total CHOL/HDL Ratio: 3
Triglycerides: 91 mg/dL (ref 0.0–149.0)
VLDL: 18.2 mg/dL (ref 0.0–40.0)

## 2016-12-12 LAB — CBC
HCT: 39.1 % (ref 36.0–46.0)
Hemoglobin: 13 g/dL (ref 12.0–15.0)
MCHC: 33.2 g/dL (ref 30.0–36.0)
MCV: 91.2 fl (ref 78.0–100.0)
Platelets: 184 10*3/uL (ref 150.0–400.0)
RBC: 4.28 Mil/uL (ref 3.87–5.11)
RDW: 13.1 % (ref 11.5–15.5)
WBC: 7.4 10*3/uL (ref 4.0–10.5)

## 2016-12-12 LAB — COMPREHENSIVE METABOLIC PANEL WITH GFR
ALT: 11 U/L (ref 0–35)
AST: 14 U/L (ref 0–37)
Albumin: 4 g/dL (ref 3.5–5.2)
Alkaline Phosphatase: 76 U/L (ref 39–117)
BUN: 26 mg/dL — ABNORMAL HIGH (ref 6–23)
CO2: 26 meq/L (ref 19–32)
Calcium: 9.9 mg/dL (ref 8.4–10.5)
Chloride: 108 meq/L (ref 96–112)
Creatinine, Ser: 0.73 mg/dL (ref 0.40–1.20)
GFR: 80.03 mL/min
Glucose, Bld: 117 mg/dL — ABNORMAL HIGH (ref 70–99)
Potassium: 4.8 meq/L (ref 3.5–5.1)
Sodium: 141 meq/L (ref 135–145)
Total Bilirubin: 0.5 mg/dL (ref 0.2–1.2)
Total Protein: 7 g/dL (ref 6.0–8.3)

## 2016-12-12 LAB — HEMOGLOBIN A1C: Hgb A1c MFr Bld: 6.3 % (ref 4.6–6.5)

## 2016-12-12 LAB — VITAMIN D 25 HYDROXY (VIT D DEFICIENCY, FRACTURES): VITD: 17.92 ng/mL — ABNORMAL LOW (ref 30.00–100.00)

## 2016-12-12 LAB — TSH: TSH: 1.15 u[IU]/mL (ref 0.35–4.50)

## 2016-12-12 NOTE — Patient Instructions (Signed)
It was a pleasure to meet you today  I will notify you of your lab work in 2-5 days  Please follow up in 3 months for complete physical

## 2016-12-12 NOTE — Progress Notes (Signed)
Subjective:    Patient ID: Kristina Wilkinson, female    DOB: [birth year], 83 y.o.   MRN: [id number]  HPI This is an 83 yo female who presents today to establish care. She is widowed and lives alone. She get out minimally, still drives. She was previously seen by Dr. Damita Dunnings, but has not been seen in this office since 06/21/15. She reports she has not been doing well. Numbness and tingling in feet. Feels unsteady. Was admitted to the hospital 6/1-07/14/16 following a fall. She hit her head and was found to have a nondepressed skull fracture, left occipital scalp laceration and small hematoma. During that visit was found to have HgbA1C greater than 15. She refused hospital follow up appointment and was dismissed by Dr. Damita Dunnings.   She reports home blood pressures are 174-081 systolic and that she has been told she has "white coat hypertension."  Has arthritis all over her body. Has injections in knees and shoulders by ortho- Dr. Lorre Nick. Sees him PRN.   Past Medical History:  Diagnosis Date  . Arthritis   . CNS aneurysm 1983   s/p clipping  . Diabetes mellitus type II    diet controlled   . DVT (deep venous thrombosis) ([place]) 10/15  . Elevated glucose   . Hypertension   . Insomnia   . Knee pain    Left Dr. Meyer Cory  . LBP (low back pain)   . PN (peripheral neuropathy)    unknown etiol   Past Surgical History:  Procedure Laterality Date  . CEREBRAL ANEURYSM REPAIR     initial repair in the 1980s, with second surgery in the 1990s  . CHOLECYSTECTOMY    . JOINT REPLACEMENT    . KNEE ARTHROPLASTY  04/2009   Left  . TOTAL KNEE ARTHROPLASTY    . TUBAL LIGATION     Family History  Problem Relation Age of Onset  . Arthritis Mother   . Stroke Mother   . Stroke Father   . Arthritis-Osteo Son    Social History  Substance Use Topics  . Smoking status: Former Research scientist (life sciences)  . Smokeless tobacco: Never Used     Comment: quit in 1983  . Alcohol use No      Review of Systems  Constitutional: Negative  for fever and unexpected weight change.  Respiratory: Negative for cough and shortness of breath.   Cardiovascular: Negative for chest pain and leg swelling.  Gastrointestinal: Negative for abdominal pain, constipation and diarrhea.  Genitourinary: Negative for dysuria.       Objective:   Physical Exam Physical Exam  Constitutional: Oriented to person, place, and time. She appears well-developed and well-nourished.  HENT:  Head: Normocephalic and atraumatic.  Eyes: Conjunctivae are normal.  Neck: Normal range of motion. Neck supple.  Cardiovascular: Normal rate, regular rhythm and normal heart sounds.   Pulmonary/Chest: Effort normal and breath sounds normal.  Neurological: Alert and oriented.  Skin: Skin is warm and dry.  Psychiatric: Normal mood and affect. Behavior is normal. Judgment and thought content normal.  Vitals reviewed.  BP (!) 180/110 (BP Location: Left Arm, Patient Position: Sitting, Cuff Size: Normal)   Pulse 73   Temp 97.8 F (36.6 C) (Oral)   Ht 5\' 2"  (1.575 m)   Wt 149 lb (67.6 kg)   SpO2 98%   BMI 27.25 kg/m  BP Readings from Last 3 Encounters:  12/12/16 (!) 180/110  07/14/16 (!) 165/67  06/21/15 (!) 262/90  Recheck - BP: (!) 154/98  Wt Readings from Last 3 Encounters:  12/12/16 149 lb (67.6 kg)  07/13/16 160 lb (72.6 kg)  06/21/15 172 lb 8 oz (78.2 kg)    Depression screen [facility] 2/9 12/12/2016 07/06/2014 04/20/2014  Decreased Interest 0 0 0  Down, Depressed, Hopeless 0 0 0  PHQ - 2 Score 0 0 0       Assessment & Plan:  1. Type 2 diabetes mellitus with hyperglycemia, without long-term current use of insulin ([place]) - patient not currently on any diabetes meds, will check labs today. She states she is "diet controlled.'  - CBC - Comprehensive metabolic panel - Hemoglobin A1c - TSH - Lipid panel  2. Essential hypertension - discussed concern for very elevated readings and increased risk of stroke, especially with history of aneurysm and clip-  she refused to start medication, stating that she is "allergic," and that she is understands risks associated with untreatment.  - CBC - Comprehensive metabolic panel - TSH  3. Vitamin D deficiency - VITAMIN D 25 Hydroxy (Vit-D Deficiency, Fractures)  4. Encounter to establish care - follow up in 3 months  5. Need for pneumococcal vaccination - Pneumococcal conjugate vaccine 13-valent   Clarene Reamer, FNP-BC  [facility]  12/12/2016 1:00 PM

## 2017-04-18 ENCOUNTER — Encounter: Payer: Self-pay | Admitting: Family Medicine

## 2017-04-18 ENCOUNTER — Other Ambulatory Visit: Payer: Self-pay | Admitting: Family Medicine

## 2017-04-18 DIAGNOSIS — E1165 Type 2 diabetes mellitus with hyperglycemia: Secondary | ICD-10-CM

## 2017-04-18 DIAGNOSIS — I10 Essential (primary) hypertension: Secondary | ICD-10-CM

## 2017-04-18 DIAGNOSIS — E559 Vitamin D deficiency, unspecified: Secondary | ICD-10-CM

## 2017-04-24 ENCOUNTER — Ambulatory Visit (INDEPENDENT_AMBULATORY_CARE_PROVIDER_SITE_OTHER): Payer: Medicare Other

## 2017-04-24 ENCOUNTER — Ambulatory Visit: Payer: Medicare Other

## 2017-04-24 VITALS — BP 134/90 | HR 82 | Temp 98.1°F | Ht 61.5 in | Wt 162.5 lb

## 2017-04-24 DIAGNOSIS — E1165 Type 2 diabetes mellitus with hyperglycemia: Secondary | ICD-10-CM

## 2017-04-24 DIAGNOSIS — E559 Vitamin D deficiency, unspecified: Secondary | ICD-10-CM

## 2017-04-24 DIAGNOSIS — Z0000 Encounter for general adult medical examination without abnormal findings: Secondary | ICD-10-CM

## 2017-04-24 DIAGNOSIS — E119 Type 2 diabetes mellitus without complications: Secondary | ICD-10-CM

## 2017-04-24 DIAGNOSIS — I10 Essential (primary) hypertension: Secondary | ICD-10-CM | POA: Diagnosis not present

## 2017-04-24 LAB — CBC
HCT: 38.3 % (ref 36.0–46.0)
Hemoglobin: 12.9 g/dL (ref 12.0–15.0)
MCHC: 33.6 g/dL (ref 30.0–36.0)
MCV: 90.2 fl (ref 78.0–100.0)
Platelets: 172 10*3/uL (ref 150.0–400.0)
RBC: 4.24 Mil/uL (ref 3.87–5.11)
RDW: 13.2 % (ref 11.5–15.5)
WBC: 9.5 10*3/uL (ref 4.0–10.5)

## 2017-04-24 LAB — BASIC METABOLIC PANEL
BUN: 25 mg/dL — ABNORMAL HIGH (ref 6–23)
CO2: 26 mEq/L (ref 19–32)
Calcium: 10 mg/dL (ref 8.4–10.5)
Chloride: 106 mEq/L (ref 96–112)
Creatinine, Ser: 0.75 mg/dL (ref 0.40–1.20)
GFR: 77.51 mL/min (ref >60.00)
Glucose, Bld: 121 mg/dL — ABNORMAL HIGH (ref 70–99)
Potassium: 4.1 mEq/L (ref 3.5–5.1)
Sodium: 140 mEq/L (ref 135–145)

## 2017-04-24 LAB — HEMOGLOBIN A1C: Hgb A1c MFr Bld: 6.4 % (ref 4.6–6.5)

## 2017-04-24 LAB — VITAMIN D 25 HYDROXY (VIT D DEFICIENCY, FRACTURES): VITD: 12.01 ng/mL — ABNORMAL LOW (ref 30.00–100.00)

## 2017-04-24 NOTE — Progress Notes (Signed)
Subjective:   Kristina Wilkinson is a 84 y.o. female who presents for Medicare Annual (Subsequent) preventive examination.  Review of Systems:  N/A Cardiac Risk Factors include: advanced age (>58men, >8 women);diabetes mellitus;hypertension;obesity (BMI >30kg/m2)     Objective:     Vitals: BP 134/90 (BP Location: Right Arm, Patient Position: Sitting, Cuff Size: Normal)   Pulse 82   Temp 98.1 F (36.7 C) (Oral)   Ht 5' 1.5" (1.562 m) Comment: no shoes  Wt 162 lb 8 oz (73.7 kg)   SpO2 97%   BMI 30.21 kg/m   Body mass index is 30.21 kg/m.  Advanced Directives 04/24/2017 07/14/2016 07/13/2016  Does Patient Have a Medical Advance Directive? Yes Yes Yes  Type of Advance Directive Living will Living will Living will  Does patient want to make changes to medical advance directive? - No - Patient declined -    Tobacco Social History   Tobacco Use  Smoking Status Former Smoker  Smokeless Tobacco Never Used  Tobacco Comment   quit in 1983     Counseling given: No Comment: quit in 1983   Clinical Intake:  Pre-visit preparation completed: Yes  Pain : No/denies pain Pain Score: 0-No pain     Nutritional Status: BMI > 30  Obese Nutritional Risks: None Diabetes: Yes CBG done?: No Did pt. bring in CBG monitor from home?: No  How often do you need to have someone help you when you read instructions, pamphlets, or other written materials from your doctor or pharmacy?: 1 - Never What is the last grade level you completed in school?: 12th grade  Interpreter Needed?: No  Comments: pt is a widow and lives alone Information entered by :: LPinson, LPN  Past Medical History:  Diagnosis Date  . Arthritis   . CNS aneurysm 1983   s/p clipping  . Diabetes mellitus type II    diet controlled   . DVT (deep venous thrombosis) ([place]) 10/15  . Elevated glucose   . Hypertension   . Insomnia   . Knee pain    Left Dr. Meyer Wilkinson  . LBP (low back pain)   . PN (peripheral neuropathy)      unknown etiol   Past Surgical History:  Procedure Laterality Date  . CEREBRAL ANEURYSM REPAIR     initial repair in the 1980s, with second surgery in the 1990s  . CHOLECYSTECTOMY    . JOINT REPLACEMENT    . KNEE ARTHROPLASTY  04/2009   Left  . TOTAL KNEE ARTHROPLASTY    . TUBAL LIGATION     Family History  Problem Relation Age of Onset  . Arthritis Mother   . Stroke Mother   . Stroke Father   . Arthritis-Osteo Son    Social History   Socioeconomic History  . Marital status: Widowed    Spouse name: None  . Number of children: 1  . Years of education: None  . Highest education level: None  Social Needs  . Financial resource strain: None  . Food insecurity - worry: None  . Food insecurity - inability: None  . Transportation needs - medical: None  . Transportation needs - non-medical: None  Occupational History  . Occupation: retired    Fish farm manager: RETIRED  Tobacco Use  . Smoking status: Former Research scientist (life sciences)  . Smokeless tobacco: Never Used  . Tobacco comment: quit in 1983  Substance and Sexual Activity  . Alcohol use: No    Alcohol/week: 0.0 oz  . Drug use: No  .  Sexual activity: Not Currently  Other Topics Concern  . None  Social History Narrative   Lives alone in a [place] home.  Husband passed away in 06/07/1999.   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   1 son, not local.     Enjoys shopping   Still drives, uses a cane.      Outpatient Encounter Medications as of 04/24/2017  Medication Sig  . Polyethyl Glycol-Propyl Glycol (SYSTANE) 0.4-0.3 % SOLN Place 1 drop into both eyes 2 (two) times daily.  . [DISCONTINUED] hydrocortisone cream 1 % Apply 1 application topically as needed (Irritation).   . [DISCONTINUED] sulindac (CLINORIL) 150 MG tablet Take 150 mg by mouth every other day.   . [DISCONTINUED] vitamin B-12 (CYANOCOBALAMIN) 1000 MCG tablet Take 1,000 mcg by mouth daily.  . [DISCONTINUED] acetaminophen (TYLENOL) 325 MG tablet Take 2 tablets (650 mg  total) by mouth every 6 (six) hours as needed for headache. (Patient not taking: Reported on 12/12/2016)   No facility-administered encounter medications on file as of 04/24/2017.     Activities of Daily Living In your present state of health, do you have any difficulty performing the following activities: 04/24/2017 07/14/2016  Hearing? Kristina Wilkinson Don[place]  Vision? N N  Difficulty concentrating or making decisions? N N  Walking or climbing stairs? Y N  Dressing or bathing? N N  Doing errands, shopping? N N  Preparing Food and eating ? N -  Using the Toilet? N -  In the past six months, have you accidently leaked urine? N -  Do you have problems with loss of bowel control? N -  Managing your Medications? N -  Managing your Finances? N -  Housekeeping or managing your Housekeeping? N -  Some recent data might be hidden    Patient Care Team: Elby Beck, FNP as PCP - General (Nurse Practitioner) Garald Balding, MD (Orthopedic Surgery)    Assessment:   This is a routine wellness examination for Kristina Wilkinson.   Hearing Screening   125Hz  250Hz  500Hz  1000Hz  2000Hz  3000Hz  4000Hz  6000Hz  8000Hz   Right ear:   0 0 0  0    Left ear:   0 0 0  0    Vision Screening Comments: Vision exam every 6 mths with Dr. Elsie Wilkinson    Exercise Activities and Dietary recommendations Current Exercise Habits: The patient does not participate in regular exercise at present, Exercise limited by: orthopedic condition(s)  Goals    . Follow up with Primary Care Provider     Starting 04/24/2017, I will continue to take medications as prescribed and to keep appointments with PCP as scheduled.        Fall Risk Fall Risk  04/24/2017 12/12/2016 10/14/2015 07/06/2014 04/20/2014  Falls in the past year? Yes Yes No No No  Comment fell after losing balance; skull fracture led to hospitalization - [facility] Telephone Survey: data to providers prior to load - -  Number falls in past yr: 1 1 - - -  Injury with Fall? Yes Yes  - - -  Risk for fall due to : Impaired balance/gait;Impaired mobility - - - -   Depression Screen PHQ 2/9 Scores 04/24/2017 12/12/2016 07/06/2014 04/20/2014  PHQ - 2 Score 0 0 0 0  PHQ- 9 Score 0 - - -     Cognitive Function MMSE - Mini Mental State Exam 04/24/2017  Orientation to time 5  Orientation to Place 5  Registration 3  Attention/ Calculation 0  Recall 2  Recall-comments unable to recall 1 of 3 words  Language- name 2 objects 0  Language- repeat 1  Language- follow 3 step command 2  Language- follow 3 step command-comments unable to follow 1 step of 3 step command  Language- read & follow direction 0  Write a sentence 0  Copy design 0  Total score 18     PLEASE NOTE: A Mini-Cog screen was completed. Maximum score is 20. A value of 0 denotes this part of Folstein MMSE was not completed or the patient failed this part of the Mini-Cog screening.   Mini-Cog Screening Orientation to Time - Max 5 pts Orientation to Place - Max 5 pts Registration - Max 3 pts Recall - Max 3 pts Language Repeat - Max 1 pts Language Follow 3 Step Command - Max 3 pts     Immunization History  Administered Date(s) Administered  . Pneumococcal Conjugate-13 12/12/2016  . Pneumococcal Polysaccharide-23 11/16/2008  . Tdap 12/14/2014, 07/13/2016   Screening Tests Health Maintenance  Topic Date Due  . FOOT EXAM  05/01/2017 (Originally 04/17/1937)  . INFLUENZA VACCINE  12/25/2017 (Originally 09/12/2016)  . DEXA SCAN  04/25/2018 (Originally 04/17/1994)  . HEMOGLOBIN A1C  10/25/2017  . OPHTHALMOLOGY EXAM  01/12/2018  . URINE MICROALBUMIN  04/25/2018  . TETANUS/TDAP  07/14/2026  . PNA vac Low Risk Adult  Completed       Plan:     I have personally reviewed, addressed, and noted the following in the patient's chart:  A. Medical and social history B. Use of alcohol, tobacco or illicit drugs  C. Current medications and supplements D. Functional ability and status E.  Nutritional status F.    Physical activity G. Advance directives H. List of other physicians I.  Hospitalizations, surgeries, and ER visits in previous 12 months J.  Ava to include hearing, vision, cognitive, depression L. Referrals and appointments - none  In addition, I have reviewed and discussed with patient certain preventive protocols, quality metrics, and best practice recommendations. A written personalized care plan for preventive services as well as general preventive health recommendations were provided to patient.  See attached scanned questionnaire for additional information.   Signed,   Lindell Noe, MHA, BS, LPN Health Coach

## 2017-04-24 NOTE — Progress Notes (Signed)
PCP notes:   Health maintenance:  Foot exam - PCP please address at next appt Flu vaccine - pt declined Bone density - PCP please address at next appt Urine microalbumin - pt will provide at CPE appt  Abnormal screenings:   Hearing - failed  Hearing Screening   125Hz  250Hz  500Hz  1000Hz  2000Hz  3000Hz  4000Hz  6000Hz  8000Hz   Right ear:   0 0 0  0    Left ear:   0 0 0  0     Fall risk - hx of single fall Fall Risk  04/24/2017 12/12/2016 10/14/2015 07/06/2014 04/20/2014  Falls in the past year? Yes Yes No No No  Comment fell after losing balance; skull fracture led to hospitalization - [facility] Telephone Survey: data to providers prior to load - -  Number falls in past yr: 1 1 - - -  Injury with Fall? Yes Yes - - -  Risk for fall due to : Impaired balance/gait;Impaired mobility - - - -   Mini-Cog score: 18/20 MMSE - Mini Mental State Exam 04/24/2017  Orientation to time 5  Orientation to Place 5  Registration 3  Attention/ Calculation 0  Recall 2  Recall-comments unable to recall 1 of 3 words  Language- name 2 objects 0  Language- repeat 1  Language- follow 3 step command 2  Language- follow 3 step command-comments unable to follow 1 step of 3 step command  Language- read & follow direction 0  Write a sentence 0  Copy design 0  Total score 18    Patient concerns:   None  Nurse concerns:  None  Next PCP appt:   05/01/17 @ 103 I reviewed health advisor's note, was available for consultation, and agree with documentation and plan Loura Pardon MD .

## 2017-04-24 NOTE — Patient Instructions (Signed)
Kristina Wilkinson , Thank you for taking time to come for your Medicare Wellness Visit. I appreciate your ongoing commitment to your health goals. Please review the following plan we discussed and let me know if I can assist you in the future.   These are the goals we discussed: Goals    . Follow up with Primary Care Provider     Starting 04/24/2017, I will continue to take medications as prescribed and to keep appointments with PCP as scheduled.        This is a list of the screening recommended for you and due dates:  Health Maintenance  Topic Date Due  . Complete foot exam   05/01/2017*  . Flu Shot  12/25/2017*  . DEXA scan (bone density measurement)  04/25/2018*  . Hemoglobin A1C  10/25/2017  . Eye exam for diabetics  01/12/2018  . Urine Protein Check  04/25/2018  . Tetanus Vaccine  07/14/2026  . Pneumonia vaccines  Completed  *Topic was postponed. The date shown is not the original due date.   Preventive Care for Adults  A healthy lifestyle and preventive care can promote health and wellness. Preventive health guidelines for adults include the following key practices.  . A routine yearly physical is a good way to check with your health care provider about your health and preventive screening. It is a chance to share any concerns and updates on your health and to receive a thorough exam.  . Visit your dentist for a routine exam and preventive care every 6 months. Brush your teeth twice a day and floss once a day. Good oral hygiene prevents tooth decay and gum disease.  . The frequency of eye exams is based on your age, health, family medical history, use  of contact lenses, and other factors. Follow your health care provider's recommendations for frequency of eye exams.  . Eat a healthy diet. Foods like vegetables, fruits, whole grains, low-fat dairy products, and lean protein foods contain the nutrients you need without too many calories. Decrease your intake of foods high in solid  fats, added sugars, and salt. Eat the right amount of calories for you. Get information about a proper diet from your health care provider, if necessary.  . Regular physical exercise is one of the most important things you can do for your health. Most adults should get at least 150 minutes of moderate-intensity exercise (any activity that increases your heart rate and causes you to sweat) each week. In addition, most adults need muscle-strengthening exercises on 2 or more days a week.  Silver Sneakers may be a benefit available to you. To determine eligibility, you may visit the website: www.silversneakers.com or contact program at 7136190678 Mon-Fri between 8AM-8PM.   . Maintain a healthy weight. The body mass index (BMI) is a screening tool to identify possible weight problems. It provides an estimate of body fat based on height and weight. Your health care provider can find your BMI and can help you achieve or maintain a healthy weight.   For adults 20 years and older: ? A BMI below 18.5 is considered underweight. ? A BMI of 18.5 to 24.9 is normal. ? A BMI of 25 to 29.9 is considered overweight. ? A BMI of 30 and above is considered obese.   . Maintain normal blood lipids and cholesterol levels by exercising and minimizing your intake of saturated fat. Eat a balanced diet with plenty of fruit and vegetables. Blood tests for lipids and cholesterol should begin  at age 34 and be repeated every 5 years. If your lipid or cholesterol levels are high, you are over 50, or you are at high risk for heart disease, you may need your cholesterol levels checked more frequently. Ongoing high lipid and cholesterol levels should be treated with medicines if diet and exercise are not working.  . If you smoke, find out from your health care provider how to quit. If you do not use tobacco, please do not start.  . If you choose to drink alcohol, please do not consume more than 2 drinks per day. One drink is  considered to be 12 ounces (355 mL) of beer, 5 ounces (148 mL) of wine, or 1.5 ounces (44 mL) of liquor.  . If you are 81-56 years old, ask your health care provider if you should take aspirin to prevent strokes.  . Use sunscreen. Apply sunscreen liberally and repeatedly throughout the day. You should seek shade when your shadow is shorter than you. Protect yourself by wearing long sleeves, pants, a wide-brimmed hat, and sunglasses year round, whenever you are outdoors.  . Once a month, do a whole body skin exam, using a mirror to look at the skin on your back. Tell your health care provider of new moles, moles that have irregular borders, moles that are larger than a pencil eraser, or moles that have changed in shape or color.

## 2017-05-01 ENCOUNTER — Encounter: Payer: Self-pay | Admitting: Family Medicine

## 2017-05-01 ENCOUNTER — Ambulatory Visit (INDEPENDENT_AMBULATORY_CARE_PROVIDER_SITE_OTHER): Payer: Medicare Other | Admitting: Family Medicine

## 2017-05-01 VITALS — BP 182/128 | HR 87 | Temp 98.5°F | Wt 165.2 lb

## 2017-05-01 DIAGNOSIS — R300 Dysuria: Secondary | ICD-10-CM

## 2017-05-01 DIAGNOSIS — R2681 Unsteadiness on feet: Secondary | ICD-10-CM

## 2017-05-01 DIAGNOSIS — E119 Type 2 diabetes mellitus without complications: Secondary | ICD-10-CM

## 2017-05-01 DIAGNOSIS — E559 Vitamin D deficiency, unspecified: Secondary | ICD-10-CM | POA: Diagnosis not present

## 2017-05-01 DIAGNOSIS — I10 Essential (primary) hypertension: Secondary | ICD-10-CM | POA: Diagnosis not present

## 2017-05-01 DIAGNOSIS — R42 Dizziness and giddiness: Secondary | ICD-10-CM

## 2017-05-01 LAB — POC URINALSYSI DIPSTICK (AUTOMATED)
Bilirubin, UA: NEGATIVE
Blood, UA: NEGATIVE
Glucose, UA: NEGATIVE
Ketones, UA: NEGATIVE
Leukocytes, UA: NEGATIVE
Nitrite, UA: NEGATIVE
Protein, UA: NEGATIVE
Spec Grav, UA: >=1.03 — AB (ref 1.010–1.025)
Urobilinogen, UA: 0.2 E.U./dL
pH, UA: 6 (ref 5.0–8.0)

## 2017-05-01 LAB — MICROALBUMIN / CREATININE URINE RATIO
Creatinine,U: 112.5 mg/dL
Microalb Creat Ratio: 2.9 mg/g (ref 0.0–30.0)
Microalb, Ur: 3.3 mg/dL — ABNORMAL HIGH (ref 0.0–1.9)

## 2017-05-01 NOTE — Progress Notes (Signed)
Subjective:    Patient ID: Kristina Wilkinson, female    DOB: 1927/03/12, 84 y.o.   MRN: [id number]  HPI This is an 84 yo female who presents today for follow up of HTN and new dysuria. She had a Medicare AWV last week. She reports that she is doing "horrible." Has multiple sites of arthritis pain and was seen by ortho 2/19 and prescribed sulindac and elavil. Was given injection of left shoulder at that time.   HTN- Has well documented hypertension. Believes that medication in past ruined her ability to walk and maintain good balance. She has sensation of dizziness, sometimes with getting up and down. Has been on multiple medications in past including maxide, HCTZ, lisinopril and losartan. She is adamant that she has white coat HTN and she will not take any medication for HTN. Checks her blood pressure at home up to 4 times day, runs 120-130s/80s.   Dysuria- She has had noticed some increased urinary frequency and dysuria.   Vitamin D deficiency- level 12, down from 17.92 four month ago. She reports that she is unable to take supplement because it makes her feel terrible. She states that her level is low because she spent so much of last summer indoors following her fall.   DM type 2- not currently on medication, hemoglobin A1C- 6.4  Decreased hearing- had audiology evaluation and was offered hearing aids which she declined   Last CPE- 11/16 Mammo- NA, screening not indicated Pap- NA, screening not indicated Colonoscopy- NA, screening not indicated Tdap-  07/13/2016 Flu- declines Exercise- does her own house and yard work    Past Medical History:  Diagnosis Date  . Arthritis   . CNS aneurysm 1983   s/p clipping  . Diabetes mellitus type II    diet controlled   . DVT (deep venous thrombosis) ([place]) 10/15  . Elevated glucose   . Hypertension   . Insomnia   . Knee pain    Left Dr. Meyer Cory  . LBP (low back pain)   . PN (peripheral neuropathy)    unknown etiol   Past Surgical  History:  Procedure Laterality Date  . CEREBRAL ANEURYSM REPAIR     initial repair in the 1980s, with second surgery in the 1990s  . CHOLECYSTECTOMY    . JOINT REPLACEMENT    . KNEE ARTHROPLASTY  04/2009   Left  . TOTAL KNEE ARTHROPLASTY    . TUBAL LIGATION     Family History  Problem Relation Age of Onset  . Arthritis Mother   . Stroke Mother   . Stroke Father   . Arthritis-Osteo Son    Social History   Tobacco Use  . Smoking status: Former Research scientist (life sciences)  . Smokeless tobacco: Never Used  . Tobacco comment: quit in 1983  Substance Use Topics  . Alcohol use: No    Alcohol/week: 0.0 oz  . Drug use: No      Review of Systems Per HPI    Objective:   Physical Exam  Constitutional: She is oriented to person, place, and time. She appears well-developed and well-nourished. No distress.  Obese. Appears stated age. Well groomed.   HENT:  Head: Normocephalic and atraumatic.  Eyes: Conjunctivae are normal.  Cardiovascular: Normal rate, regular rhythm and normal heart sounds.  Pulmonary/Chest: Effort normal and breath sounds normal.  Abdominal: Soft. Bowel sounds are normal. She exhibits no distension. There is no tenderness. There is no rebound and no guarding.  Musculoskeletal: She exhibits no edema.  Neurological: She is alert and oriented to person, place, and time.  Skin: Skin is warm and dry. She is not diaphoretic.  Psychiatric: She has a normal mood and affect. Her behavior is normal. Thought content normal.  Vitals reviewed.     BP (!) 182/128   Pulse 87   Temp 98.5 F (36.9 C) (Oral)   Wt 165 lb 4 oz (75 kg)   SpO2 98%   BMI 30.72 kg/m  Wt Readings from Last 3 Encounters:  05/01/17 165 lb 4 oz (75 kg)  04/24/17 162 lb 8 oz (73.7 kg)  12/12/16 149 lb (67.6 kg)   BP Readings from Last 3 Encounters:  05/01/17 (!) 182/128  04/24/17 134/90  12/12/16 (!) 154/98   Orthostatic Blood Pressure: Blood pressure:   lying 204/100, sitting 226/96, standing  225/101 Pulse:   lying 73, sitting 77, standing 79 Results for orders placed or performed in visit on 05/01/17  Microalbumin/Creatinine Ratio, Urine  Result Value Ref Range   Microalb, Ur 3.3 (H) 0.0 - 1.9 mg/dL   Creatinine,U 112.5 mg/dL   Microalb Creat Ratio 2.9 0.0 - 30.0 mg/g  POCT Urinalysis Dipstick (Automated)  Result Value Ref Range   Color, UA yellow    Clarity, UA clear    Glucose, UA neg    Bilirubin, UA neg    Ketones, UA neg    Spec Grav, UA >=1.030 (A) 1.010 - 1.025   Blood, UA neg    pH, UA 6.0 5.0 - 8.0   Protein, UA neg    Urobilinogen, UA 0.2 0.2 or 1.0 E.U./dL   Nitrite, UA neg    Leukocytes, UA Negative Negative       Assessment & Plan:  1. Dysuria - urine very concentrated with SG > 1.030, otherwise normal, encouraged patient to increase fluids - POCT Urinalysis Dipstick (Automated)  2. Diabetes mellitus without complication (HCC) - hemoglobin A1C stable - Microalbumin/Creatinine Ratio, Urine  3. Vitamin D deficiency - patient adamant that she is unable to take supplement, encouraged her to consume adequate dairy and get daily sun exposure  4. Essential hypertension - Blood pressure reading increased the more they were taken, patient insistent that she will not take any antihypertensive medications. Once again, discussed potential for stroke especially with history of anyurism.   5. Unsteady gait - offered to put in order for PT and patient reports she will consider  6. Lightheadedness/headache - could be related to HTN, vascular insufficiency, she is not orthostatic - encouraged her to continue activity as tolerated, change positions slowly  - follow up 6 months  Clarene Reamer, FNP-BC  [facility], [place]  05/04/2017 11:05 AM

## 2017-05-01 NOTE — Patient Instructions (Signed)
Good to see you today  Your blood pressure is very high which is likely causing your light headedness and could be causing your headache.

## 2017-05-04 ENCOUNTER — Encounter: Payer: Self-pay | Admitting: Family Medicine

## 2017-05-20 ENCOUNTER — Ambulatory Visit: Payer: Medicare Other

## 2017-05-22 ENCOUNTER — Ambulatory Visit: Payer: Medicare Other | Admitting: Family Medicine

## 2017-05-28 ENCOUNTER — Ambulatory Visit: Payer: Medicare Other

## 2017-08-22 DIAGNOSIS — H401113 Primary open-angle glaucoma, right eye, severe stage: Secondary | ICD-10-CM | POA: Diagnosis not present

## 2017-08-22 DIAGNOSIS — Z961 Presence of intraocular lens: Secondary | ICD-10-CM | POA: Diagnosis not present

## 2017-12-19 DIAGNOSIS — G8929 Other chronic pain: Secondary | ICD-10-CM | POA: Diagnosis not present

## 2017-12-19 DIAGNOSIS — M25511 Pain in right shoulder: Secondary | ICD-10-CM | POA: Diagnosis not present

## 2017-12-19 DIAGNOSIS — M19011 Primary osteoarthritis, right shoulder: Secondary | ICD-10-CM | POA: Diagnosis not present

## 2017-12-19 DIAGNOSIS — M25512 Pain in left shoulder: Secondary | ICD-10-CM | POA: Diagnosis not present

## 2017-12-19 DIAGNOSIS — M19012 Primary osteoarthritis, left shoulder: Secondary | ICD-10-CM | POA: Diagnosis not present

## 2017-12-19 DIAGNOSIS — Z96653 Presence of artificial knee joint, bilateral: Secondary | ICD-10-CM | POA: Diagnosis not present

## 2018-05-21 ENCOUNTER — Ambulatory Visit: Payer: Medicare Other

## 2018-05-28 ENCOUNTER — Encounter: Payer: Medicare Other | Admitting: Family Medicine

## 2018-09-11 ENCOUNTER — Other Ambulatory Visit: Payer: Self-pay

## 2018-11-30 ENCOUNTER — Emergency Department (HOSPITAL_COMMUNITY): Payer: Medicare Other

## 2018-11-30 ENCOUNTER — Other Ambulatory Visit: Payer: Self-pay

## 2018-11-30 ENCOUNTER — Inpatient Hospital Stay (HOSPITAL_COMMUNITY)
Admission: EM | Admit: 2018-11-30 | Discharge: 2018-12-05 | DRG: 565 | Disposition: A | Discharge status: Home Health | Payer: Medicare Other | Attending: Internal Medicine | Admitting: Internal Medicine

## 2018-11-30 DIAGNOSIS — Y92010 Kitchen of single-family (private) house as the place of occurrence of the external cause: Secondary | ICD-10-CM

## 2018-11-30 DIAGNOSIS — S299XXA Unspecified injury of thorax, initial encounter: Secondary | ICD-10-CM | POA: Diagnosis not present

## 2018-11-30 DIAGNOSIS — Z8261 Family history of arthritis: Secondary | ICD-10-CM

## 2018-11-30 DIAGNOSIS — R8290 Unspecified abnormal findings in urine: Secondary | ICD-10-CM | POA: Diagnosis not present

## 2018-11-30 DIAGNOSIS — Z791 Long term (current) use of non-steroidal anti-inflammatories (NSAID): Secondary | ICD-10-CM

## 2018-11-30 DIAGNOSIS — N390 Urinary tract infection, site not specified: Secondary | ICD-10-CM | POA: Diagnosis present

## 2018-11-30 DIAGNOSIS — G8929 Other chronic pain: Secondary | ICD-10-CM | POA: Diagnosis present

## 2018-11-30 DIAGNOSIS — S8991XA Unspecified injury of right lower leg, initial encounter: Secondary | ICD-10-CM | POA: Diagnosis not present

## 2018-11-30 DIAGNOSIS — I160 Hypertensive urgency: Secondary | ICD-10-CM | POA: Diagnosis not present

## 2018-11-30 DIAGNOSIS — T07XXXA Unspecified multiple injuries, initial encounter: Secondary | ICD-10-CM

## 2018-11-30 DIAGNOSIS — M79671 Pain in right foot: Secondary | ICD-10-CM | POA: Diagnosis not present

## 2018-11-30 DIAGNOSIS — S40011A Contusion of right shoulder, initial encounter: Secondary | ICD-10-CM | POA: Diagnosis not present

## 2018-11-30 DIAGNOSIS — R7401 Elevation of levels of liver transaminase levels: Secondary | ICD-10-CM | POA: Diagnosis not present

## 2018-11-30 DIAGNOSIS — S91109A Unspecified open wound of unspecified toe(s) without damage to nail, initial encounter: Secondary | ICD-10-CM | POA: Diagnosis present

## 2018-11-30 DIAGNOSIS — I10 Essential (primary) hypertension: Secondary | ICD-10-CM | POA: Diagnosis present

## 2018-11-30 DIAGNOSIS — T796XXA Traumatic ischemia of muscle, initial encounter: Secondary | ICD-10-CM

## 2018-11-30 DIAGNOSIS — S3993XA Unspecified injury of pelvis, initial encounter: Secondary | ICD-10-CM | POA: Diagnosis not present

## 2018-11-30 DIAGNOSIS — R102 Pelvic and perineal pain: Secondary | ICD-10-CM | POA: Diagnosis not present

## 2018-11-30 DIAGNOSIS — S40012A Contusion of left shoulder, initial encounter: Secondary | ICD-10-CM | POA: Diagnosis present

## 2018-11-30 DIAGNOSIS — W19XXXA Unspecified fall, initial encounter: Secondary | ICD-10-CM

## 2018-11-30 DIAGNOSIS — M79672 Pain in left foot: Secondary | ICD-10-CM | POA: Diagnosis not present

## 2018-11-30 DIAGNOSIS — D72829 Elevated white blood cell count, unspecified: Secondary | ICD-10-CM | POA: Diagnosis not present

## 2018-11-30 DIAGNOSIS — S91112A Laceration without foreign body of left great toe without damage to nail, initial encounter: Secondary | ICD-10-CM | POA: Diagnosis present

## 2018-11-30 DIAGNOSIS — M545 Low back pain: Secondary | ICD-10-CM | POA: Diagnosis present

## 2018-11-30 DIAGNOSIS — M25562 Pain in left knee: Secondary | ICD-10-CM | POA: Diagnosis not present

## 2018-11-30 DIAGNOSIS — E876 Hypokalemia: Secondary | ICD-10-CM | POA: Diagnosis not present

## 2018-11-30 DIAGNOSIS — L0390 Cellulitis, unspecified: Secondary | ICD-10-CM | POA: Diagnosis not present

## 2018-11-30 DIAGNOSIS — E119 Type 2 diabetes mellitus without complications: Secondary | ICD-10-CM

## 2018-11-30 DIAGNOSIS — Z03818 Encounter for observation for suspected exposure to other biological agents ruled out: Secondary | ICD-10-CM | POA: Diagnosis not present

## 2018-11-30 DIAGNOSIS — R9431 Abnormal electrocardiogram [ECG] [EKG]: Secondary | ICD-10-CM | POA: Diagnosis present

## 2018-11-30 DIAGNOSIS — R531 Weakness: Secondary | ICD-10-CM | POA: Diagnosis not present

## 2018-11-30 DIAGNOSIS — W208XXA Other cause of strike by thrown, projected or falling object, initial encounter: Secondary | ICD-10-CM | POA: Diagnosis present

## 2018-11-30 DIAGNOSIS — Z20828 Contact with and (suspected) exposure to other viral communicable diseases: Secondary | ICD-10-CM | POA: Diagnosis not present

## 2018-11-30 DIAGNOSIS — S8992XA Unspecified injury of left lower leg, initial encounter: Secondary | ICD-10-CM | POA: Diagnosis not present

## 2018-11-30 DIAGNOSIS — S8002XA Contusion of left knee, initial encounter: Secondary | ICD-10-CM | POA: Diagnosis present

## 2018-11-30 DIAGNOSIS — S99922A Unspecified injury of left foot, initial encounter: Secondary | ICD-10-CM | POA: Diagnosis not present

## 2018-11-30 DIAGNOSIS — S8001XA Contusion of right knee, initial encounter: Secondary | ICD-10-CM | POA: Diagnosis present

## 2018-11-30 DIAGNOSIS — M25561 Pain in right knee: Secondary | ICD-10-CM | POA: Diagnosis not present

## 2018-11-30 DIAGNOSIS — S9031XA Contusion of right foot, initial encounter: Secondary | ICD-10-CM | POA: Diagnosis present

## 2018-11-30 DIAGNOSIS — Z602 Problems related to living alone: Secondary | ICD-10-CM | POA: Diagnosis present

## 2018-11-30 DIAGNOSIS — Z8679 Personal history of other diseases of the circulatory system: Secondary | ICD-10-CM

## 2018-11-30 DIAGNOSIS — E1142 Type 2 diabetes mellitus with diabetic polyneuropathy: Secondary | ICD-10-CM | POA: Diagnosis present

## 2018-11-30 DIAGNOSIS — M25511 Pain in right shoulder: Secondary | ICD-10-CM | POA: Diagnosis not present

## 2018-11-30 DIAGNOSIS — Z96653 Presence of artificial knee joint, bilateral: Secondary | ICD-10-CM | POA: Diagnosis present

## 2018-11-30 DIAGNOSIS — B964 Proteus (mirabilis) (morganii) as the cause of diseases classified elsewhere: Secondary | ICD-10-CM | POA: Diagnosis present

## 2018-11-30 DIAGNOSIS — R52 Pain, unspecified: Secondary | ICD-10-CM | POA: Diagnosis not present

## 2018-11-30 DIAGNOSIS — S4992XA Unspecified injury of left shoulder and upper arm, initial encounter: Secondary | ICD-10-CM | POA: Diagnosis not present

## 2018-11-30 DIAGNOSIS — S3992XA Unspecified injury of lower back, initial encounter: Secondary | ICD-10-CM | POA: Diagnosis not present

## 2018-11-30 DIAGNOSIS — S199XXA Unspecified injury of neck, initial encounter: Secondary | ICD-10-CM | POA: Diagnosis not present

## 2018-11-30 DIAGNOSIS — S99921A Unspecified injury of right foot, initial encounter: Secondary | ICD-10-CM | POA: Diagnosis not present

## 2018-11-30 DIAGNOSIS — W010XXA Fall on same level from slipping, tripping and stumbling without subsequent striking against object, initial encounter: Secondary | ICD-10-CM | POA: Diagnosis present

## 2018-11-30 DIAGNOSIS — Z79899 Other long term (current) drug therapy: Secondary | ICD-10-CM

## 2018-11-30 DIAGNOSIS — R0902 Hypoxemia: Secondary | ICD-10-CM | POA: Diagnosis not present

## 2018-11-30 DIAGNOSIS — Z888 Allergy status to other drugs, medicaments and biological substances status: Secondary | ICD-10-CM

## 2018-11-30 DIAGNOSIS — S4991XA Unspecified injury of right shoulder and upper arm, initial encounter: Secondary | ICD-10-CM | POA: Diagnosis not present

## 2018-11-30 DIAGNOSIS — Z823 Family history of stroke: Secondary | ICD-10-CM

## 2018-11-30 DIAGNOSIS — R519 Headache, unspecified: Secondary | ICD-10-CM | POA: Diagnosis not present

## 2018-11-30 DIAGNOSIS — Z880 Allergy status to penicillin: Secondary | ICD-10-CM

## 2018-11-30 DIAGNOSIS — S90812A Abrasion, left foot, initial encounter: Secondary | ICD-10-CM | POA: Diagnosis present

## 2018-11-30 DIAGNOSIS — S0990XA Unspecified injury of head, initial encounter: Secondary | ICD-10-CM | POA: Diagnosis not present

## 2018-11-30 DIAGNOSIS — Z87891 Personal history of nicotine dependence: Secondary | ICD-10-CM

## 2018-11-30 DIAGNOSIS — S41111A Laceration without foreign body of right upper arm, initial encounter: Secondary | ICD-10-CM | POA: Diagnosis present

## 2018-11-30 DIAGNOSIS — S9032XA Contusion of left foot, initial encounter: Secondary | ICD-10-CM | POA: Diagnosis present

## 2018-11-30 DIAGNOSIS — M25512 Pain in left shoulder: Secondary | ICD-10-CM | POA: Diagnosis not present

## 2018-11-30 DIAGNOSIS — L03032 Cellulitis of left toe: Secondary | ICD-10-CM | POA: Diagnosis present

## 2018-11-30 HISTORY — DX: Cellulitis, unspecified: L03.90

## 2018-11-30 HISTORY — DX: Traumatic ischemia of muscle, initial encounter: T79.6XXA

## 2018-11-30 LAB — CBC WITH DIFFERENTIAL/PLATELET
Abs Immature Granulocytes: 0.06 10*3/uL (ref 0.00–0.07)
Basophils Absolute: 0 10*3/uL (ref 0.0–0.1)
Basophils Relative: 0 %
Eosinophils Absolute: 0 10*3/uL (ref 0.0–0.5)
Eosinophils Relative: 0 %
HCT: 41.9 % (ref 36.0–46.0)
Hemoglobin: 13.6 g/dL (ref 12.0–15.0)
Immature Granulocytes: 1 %
Lymphocytes Relative: 6 %
Lymphs Abs: 0.8 10*3/uL (ref 0.7–4.0)
MCH: 30.2 pg (ref 26.0–34.0)
MCHC: 32.5 g/dL (ref 30.0–36.0)
MCV: 92.9 fL (ref 80.0–100.0)
Monocytes Absolute: 1.4 10*3/uL — ABNORMAL HIGH (ref 0.1–1.0)
Monocytes Relative: 11 %
Neutro Abs: 10.5 10*3/uL — ABNORMAL HIGH (ref 1.7–7.7)
Neutrophils Relative %: 82 %
Platelets: 174 10*3/uL (ref 150–400)
RBC: 4.51 MIL/uL (ref 3.87–5.11)
RDW: 12.6 % (ref 11.5–15.5)
WBC: 12.7 10*3/uL — ABNORMAL HIGH (ref 4.0–10.5)
nRBC: 0 % (ref 0.0–0.2)

## 2018-11-30 LAB — URINALYSIS, ROUTINE W REFLEX MICROSCOPIC
Bacteria, UA: NONE SEEN
Bilirubin Urine: NEGATIVE
Glucose, UA: 50 mg/dL — AB
Ketones, ur: 20 mg/dL — AB
Leukocytes,Ua: NEGATIVE
Nitrite: POSITIVE — AB
Protein, ur: 100 mg/dL — AB
Specific Gravity, Urine: 1.023 (ref 1.005–1.030)
pH: 5 (ref 5.0–8.0)

## 2018-11-30 LAB — COMPREHENSIVE METABOLIC PANEL
ALT: 49 U/L — ABNORMAL HIGH (ref 0–44)
AST: 154 U/L — ABNORMAL HIGH (ref 15–41)
Albumin: 3.6 g/dL (ref 3.5–5.0)
Alkaline Phosphatase: 75 U/L (ref 38–126)
Anion gap: 12 (ref 5–15)
BUN: 26 mg/dL — ABNORMAL HIGH (ref 8–23)
CO2: 22 mmol/L (ref 22–32)
Calcium: 9.9 mg/dL (ref 8.9–10.3)
Chloride: 107 mmol/L (ref 98–111)
Creatinine, Ser: 0.87 mg/dL (ref 0.44–1.00)
GFR calc Af Amer: >60 mL/min (ref >60)
GFR calc non Af Amer: 59 mL/min — ABNORMAL LOW (ref >60)
Glucose, Bld: 145 mg/dL — ABNORMAL HIGH (ref 70–99)
Potassium: 3.3 mmol/L — ABNORMAL LOW (ref 3.5–5.1)
Sodium: 141 mmol/L (ref 135–145)
Total Bilirubin: 2 mg/dL — ABNORMAL HIGH (ref 0.3–1.2)
Total Protein: 6.2 g/dL — ABNORMAL LOW (ref 6.5–8.1)

## 2018-11-30 LAB — SARS CORONAVIRUS 2 (TAT 6-24 HRS): SARS Coronavirus 2: NEGATIVE

## 2018-11-30 LAB — CK: Total CK: 6972 U/L — ABNORMAL HIGH (ref 38–234)

## 2018-11-30 LAB — GLUCOSE, CAPILLARY: Glucose-Capillary: 114 mg/dL — ABNORMAL HIGH (ref 70–99)

## 2018-11-30 MED ORDER — STERILE WATER FOR INJECTION IV SOLN
INTRAVENOUS | Status: DC
  Administered 2018-11-30: 15:00:00 via INTRAVENOUS
  Filled 2018-11-30: qty 850

## 2018-11-30 MED ORDER — ENOXAPARIN SODIUM 40 MG/0.4ML ~~LOC~~ SOLN
40.0000 mg | SUBCUTANEOUS | Status: DC
  Administered 2018-12-01 – 2018-12-04 (×4): 40 mg via SUBCUTANEOUS
  Filled 2018-11-30 (×5): qty 0.4

## 2018-11-30 MED ORDER — POTASSIUM CHLORIDE CRYS ER 20 MEQ PO TBCR
40.0000 meq | EXTENDED_RELEASE_TABLET | ORAL | Status: AC
  Administered 2018-11-30: 40 meq via ORAL

## 2018-11-30 MED ORDER — SODIUM CHLORIDE 0.9 % IV SOLN
INTRAVENOUS | Status: DC
  Administered 2018-11-30 – 2018-12-01 (×3): via INTRAVENOUS

## 2018-11-30 MED ORDER — POLYETHYL GLYCOL-PROPYL GLYCOL 0.4-0.3 % OP SOLN: 1.0000 [drp] | Freq: Two times a day (BID) | OPHTHALMIC | Status: DC

## 2018-11-30 MED ORDER — HYDRALAZINE HCL 20 MG/ML IJ SOLN
10.0000 mg | INTRAMUSCULAR | Status: DC | PRN
  Administered 2018-12-04 – 2018-12-05 (×2): 10 mg via INTRAVENOUS
  Filled 2018-11-30 (×3): qty 1

## 2018-11-30 MED ORDER — STERILE WATER FOR INJECTION IV SOLN
Freq: Once | INTRAVENOUS | Status: AC
  Administered 2018-11-30: 16:00:00 via INTRAVENOUS
  Filled 2018-11-30: qty 850

## 2018-11-30 MED ORDER — SODIUM CHLORIDE 0.9 % IV SOLN
1.0000 g | INTRAVENOUS | Status: DC
  Administered 2018-11-30 – 2018-12-04 (×5): 1 g via INTRAVENOUS
  Filled 2018-11-30 (×2): qty 1
  Filled 2018-11-30 (×2): qty 10
  Filled 2018-11-30 (×2): qty 1

## 2018-11-30 MED ORDER — HYDROCODONE-ACETAMINOPHEN 5-325 MG PO TABS
1.0000 | ORAL_TABLET | Freq: Four times a day (QID) | ORAL | Status: DC | PRN
  Administered 2018-11-30: 1 via ORAL
  Filled 2018-11-30: qty 1

## 2018-11-30 MED ORDER — AMITRIPTYLINE HCL 25 MG PO TABS: 25.0000 mg | ORAL_TABLET | Freq: Every day | ORAL | Status: DC

## 2018-11-30 MED ORDER — SODIUM CHLORIDE 0.9% FLUSH
3.0000 mL | Freq: Two times a day (BID) | INTRAVENOUS | Status: DC
  Administered 2018-11-30 – 2018-12-05 (×7): 3 mL via INTRAVENOUS

## 2018-11-30 MED ORDER — INSULIN ASPART 100 UNIT/ML ~~LOC~~ SOLN
0.0000 [IU] | Freq: Three times a day (TID) | SUBCUTANEOUS | Status: DC
  Administered 2018-12-01 – 2018-12-05 (×5): 1 [IU] via SUBCUTANEOUS

## 2018-11-30 MED ORDER — SODIUM CHLORIDE 0.9 % IV BOLUS
1000.0000 mL | Freq: Once | INTRAVENOUS | Status: AC
Start: 2018-11-30 — End: 2018-11-30
  Administered 2018-11-30: 1000 mL via INTRAVENOUS

## 2018-11-30 NOTE — H&P (Addendum)
History and Physical    [name] [id number] DOB: 05/03/1927 DOA: 11/30/2018  Referring MD/NP/PA: Isla Pence, MD PCP: Elby Beck, FNP  Patient coming from: home  Chief Complaint: Fall  I have personally briefly reviewed patient's old medical records in [place]   HPI: Kristina Wilkinson is a 85 y.o. female with medical history significant of HTN, intracranial aneurysm s/p clipping, DM type II, chronic low back pain, peripheral neuropathy, and arthritis.  Sometime after 2 PM yesterday afternoon patient had gone to use the restroom and reports tripping over a rug that was right in front of the sink.  Patient hit her head, but denied any loss of consciousness.  She reports that she was unable to move her legs and could not get to a phone and laid there overnight.  This morning when her son was unable to get a hold of prior he came over to check on her and called EMS.  Patient complains of pain in her shoulders, arms, back, and legs.  Patient lives alone and normally reports being able to get around with use of cane and complete all of her ADLs.  She has not been going out much except for to the grocery store due to COVID-19.  ED Course: On admission into the emergency department patient was noted to be afebrile, blood pressure elevated up to 183/67, and all other vital signs maintained.  Labs significant for WBC 12.7, potassium 3.3, BUN 26, creatinine 0.87, AST 154, ALT 49, and CK 6972.  Urinalysis was positive for large hemoglobin, negative leukocytes, positive nitrites, no bacteria seen, 6 to 10 RBCs.  Imaging studies did not reveal any acute fractures.  Patient was initially given 1 L normal saline IV fluids then placed on a sodium bicarb drip at 125 mL/h.  Review of Systems  Constitutional: Negative for chills, fever and malaise/fatigue.  HENT: Positive for hearing loss. Negative for sinus pain.   Eyes: Negative for photophobia and pain.  Respiratory: Negative for cough  and shortness of breath.   Cardiovascular: Negative for chest pain and PND.  Gastrointestinal: Negative for abdominal pain, nausea and vomiting.  Genitourinary: Negative for dysuria and frequency.  Musculoskeletal: Positive for back pain, falls, joint pain and myalgias.  Neurological: Positive for weakness. Negative for loss of consciousness.  Psychiatric/Behavioral: Negative for substance abuse. The patient is not nervous/anxious.     Past Medical History:  Diagnosis Date   Arthritis    CNS aneurysm 1983   s/p clipping   Diabetes mellitus type II    diet controlled    DVT (deep venous thrombosis) (HCC) 10/15   Elevated glucose    Hypertension    Insomnia    Knee pain    Left Dr. Meyer Cory   LBP (low back pain)    PN (peripheral neuropathy)    unknown etiol    Past Surgical History:  Procedure Laterality Date   CEREBRAL ANEURYSM REPAIR     initial repair in the 1980s, with second surgery in the [place]  04/2009   Left   TOTAL KNEE ARTHROPLASTY     TUBAL LIGATION       reports that she has quit smoking. She has never used smokeless tobacco. She reports that she does not drink alcohol or use drugs.  Allergies  Allergen Reactions   Gabapentin     REACTION: numb legs   Lyrica [Pregabalin] Other (  See Comments)    REACTION: makes me pass out   Penicillins     Has patient had a PCN reaction causing immediate rash, facial/tongue/throat swelling, SOB or lightheadedness with hypotension: Unknown Has patient had a PCN reaction causing severe rash involving mucus membranes or skin necrosis: Unknown Has patient had a PCN reaction that required hospitalization: Unknown Has patient had a PCN reaction occurring within the last 10 years: Yes If all of the above answers are "NO", then may proceed with Cephalosporin use.    Vit D-Vit E-Safflower Oil Other (See Comments)    Achy, makes sick    Family  History  Problem Relation Age of Onset   Arthritis Mother    Stroke Mother    Stroke Father    Arthritis-Osteo Son     Prior to Admission medications   Medication Sig Start Date End Date Taking? Authorizing Provider  amitriptyline (ELAVIL) 25 MG tablet Take by mouth. 04/03/16   [provider]  Polyethyl Glycol-Propyl Glycol (SYSTANE) 0.4-0.3 % SOLN Place 1 drop into both eyes 2 (two) times daily.    [provider]  sulindac (CLINORIL) 150 MG tablet Take by mouth. 04/03/16   [provider]    Physical Exam:  Constitutional: Elderly female who is in NAD, calm, comfortable Vitals:   11/30/18 1231 11/30/18 1235  Pulse: 61 64  Resp: 20 (!) 22  Temp:  (!) 97.5 F (36.4 C)  TempSrc:  Oral  SpO2: 100% 98%   Eyes: PERRL, lids and conjunctivae normal ENMT: Mucous membranes are dry. Posterior pharynx clear of any exudate or lesions.hard of hearing. Neck: normal, supple, no masses, no thyromegaly Respiratory: clear to auscultation bilaterally, no wheezing, no crackles. Normal respiratory effort. No accessory muscle use.  Cardiovascular: Regular rate and rhythm, no murmurs / rubs / gallops. No extremity edema. 2+ pedal pulses. No carotid bruits.  Abdomen: no tenderness, no masses palpated. No hepatosplenomegaly. Bowel sounds positive.  Musculoskeletal: no clubbing / cyanosis. No joint deformity upper and lower extremities. Good ROM, no contractures. Normal muscle tone.  Skin: Multiple bruises of the bilateral upper and lower extremities noted.  Mild erythema noted around some abrasions skin tears. Neurologic: CN 2-12 grossly intact. Sensation intact, DTR normal. Strength 5/5 in all 4.  Psychiatric: Normal judgment and insight. Alert and oriented x 3. Normal mood.     Labs on Admission: I have personally reviewed following labs and imaging studies  CBC: Recent Labs  Lab 11/30/18 1232  WBC 12.7*  NEUTROABS 10.5*  HGB 13.6  HCT 41.9  MCV 92.9  PLT  [id number]   Basic Metabolic Panel: Recent Labs  Lab 11/30/18 1232  NA 141  K 3.3*  CL 107  CO2 22  GLUCOSE 145*  BUN 26*  CREATININE 0.87  CALCIUM 9.9   GFR: CrCl cannot be calculated (Unknown ideal weight.). Liver Function Tests: Recent Labs  Lab 11/30/18 1232  AST 154*  ALT 49*  ALKPHOS 75  BILITOT 2.0*  PROT 6.2*  ALBUMIN 3.6   No results for input(s): LIPASE, AMYLASE in the last 168 hours. No results for input(s): AMMONIA in the last 168 hours. Coagulation Profile: No results for input(s): INR, PROTIME in the last 168 hours. Cardiac Enzymes: Recent Labs  Lab 11/30/18 1232  CKTOTAL 6,972*   BNP (last 3 results) No results for input(s): PROBNP in the last 8760 hours. HbA1C: No results for input(s): HGBA1C in the last 72 hours. CBG: No results for input(s): GLUCAP in the last 168  hours. Lipid Profile: No results for input(s): CHOL, HDL, LDLCALC, TRIG, CHOLHDL, LDLDIRECT in the last 72 hours. Thyroid Function Tests: No results for input(s): TSH, T4TOTAL, FREET4, T3FREE, THYROIDAB in the last 72 hours. Anemia Panel: No results for input(s): VITAMINB12, FOLATE, FERRITIN, TIBC, IRON, RETICCTPCT in the last 72 hours. Urine analysis:    Component Value Date/Time   COLORURINE YELLOW 04/14/2012 0930   APPEARANCEUR CLOUDY (A) 04/14/2012 0930   LABSPEC 1.020 04/14/2012 0930   PHURINE 6.0 04/14/2012 0930   GLUCOSEU NEGATIVE 04/14/2012 0930   GLUCOSEU NEGATIVE 01/16/2011 0914   HGBUR TRACE (A) 04/14/2012 0930   BILIRUBINUR neg 05/01/2017 1220   KETONESUR 40 (A) 04/14/2012 0930   PROTEINUR neg 05/01/2017 1220   PROTEINUR NEGATIVE 04/14/2012 0930   UROBILINOGEN 0.2 05/01/2017 1220   UROBILINOGEN 0.2 04/14/2012 0930   NITRITE neg 05/01/2017 1220   NITRITE NEGATIVE 04/14/2012 0930   LEUKOCYTESUR Negative 05/01/2017 1220   Sepsis Labs: No results found for this or any previous visit (from the past 240 hour(s)).   Radiological Exams on Admission: Dg Chest 2  View  Result Date: 11/30/2018 CLINICAL DATA:  Unwitnessed fall. EXAM: CHEST - 2 VIEW COMPARISON:  09/11/2012 chest radiograph FINDINGS: This is a low volume film with mild bibasilar atelectasis. There is no evidence of focal airspace disease, pulmonary edema, suspicious pulmonary nodule/mass, pleural effusion, or pneumothorax. No acute bony abnormalities are identified. IMPRESSION: Low volume film with mild bibasilar atelectasis. Electronically Signed   By: Margarette Canada M.D.   On: 11/30/2018 14:17   Dg Lumbar Spine Complete  Result Date: 11/30/2018 CLINICAL DATA:  Acute low back pain following fall. Initial encounter. EXAM: LUMBAR SPINE - COMPLETE 4+ VIEW COMPARISON:  None. FINDINGS: No acute fracture or subluxation. Moderate multilevel degenerative disc disease and facet arthropathy again noted. No focal bony lesions are identified. Aortic atherosclerotic calcifications are noted. IMPRESSION: 1. No evidence of acute bony abnormality. 2. Moderate multilevel degenerative disc disease and facet arthropathy. 3.  Aortic Atherosclerosis (ICD10-I70.0). Electronically Signed   By: Margarette Canada M.D.   On: 11/30/2018 14:10   Dg Pelvis 1-2 Views  Result Date: 11/30/2018 CLINICAL DATA:  Acute pelvic pain following fall. Initial encounter. EXAM: PELVIS - 1-2 VIEW COMPARISON:  None. FINDINGS: There is no evidence of pelvic fracture or diastasis. No pelvic bone lesions are seen. IMPRESSION: Negative. Electronically Signed   By: Margarette Canada M.D.   On: 11/30/2018 14:07   Dg Shoulder Right  Result Date: 11/30/2018 CLINICAL DATA:  Acute RIGHT shoulder pain following fall. Initial encounter. EXAM: RIGHT SHOULDER - 2+ VIEW COMPARISON:  None. FINDINGS: No acute fracture, subluxation or dislocation. Degenerative changes at the glenohumeral and AC joints noted. No focal bony lesions are present. IMPRESSION: No evidence of acute bony abnormality. Electronically Signed   By: Margarette Canada M.D.   On: 11/30/2018 14:03   Ct  Head Wo Contrast  Result Date: 11/30/2018 CLINICAL DATA:  Fall, headache EXAM: CT HEAD WITHOUT CONTRAST CT CERVICAL SPINE WITHOUT CONTRAST TECHNIQUE: Multidetector CT imaging of the head and cervical spine was performed following the standard protocol without intravenous contrast. Multiplanar CT image reconstructions of the cervical spine were also generated. COMPARISON:  CT head dated 07/13/2016 FINDINGS: CT HEAD FINDINGS Brain: No evidence of acute infarction, hemorrhage, hydrocephalus, extra-axial collection or mass lesion/mass effect. Global cortical and central atrophy. Secondary ventricular prominence. Subcortical white matter and periventricular small vessel ischemic changes. Vascular: Intracranial atherosclerosis. Left vertebral artery aneurysm clips. Skull: Prior suboccipital craniectomy. Negative for  fracture focal lesion. Sinuses/Orbits: Partial opacification of the right maxillary sinus. Visualized paranasal sinuses mastoid air cells are otherwise clear. Other: None. CT CERVICAL SPINE FINDINGS Alignment: Normal cervical lordosis. Skull base and vertebrae: No acute fracture. No primary bone lesion or focal pathologic process. Soft tissues and spinal canal: No prevertebral fluid or swelling. No visible canal hematoma. Disc levels: Moderate multilevel degenerative changes. Spinal canal is patent. Upper chest: Visualized lung apices are notable for right apical pleural-parenchymal scarring. Other: Visualized thyroid is notable for a 13 mm right thyroid nodule. IMPRESSION: No evidence of acute intracranial abnormality. Atrophy with small vessel ischemic changes. Prior suboccipital craniectomy and postprocedural changes related to prior left vertebral artery aneurysm clipping. No evidence medic injury to cervical spine. Moderate multilevel degenerative changes. Electronically Signed   By: Julian Hy M.D.   On: 11/30/2018 14:34   Ct Cervical Spine Wo Contrast  Result Date: 11/30/2018 CLINICAL  DATA:  Fall, headache EXAM: CT HEAD WITHOUT CONTRAST CT CERVICAL SPINE WITHOUT CONTRAST TECHNIQUE: Multidetector CT imaging of the head and cervical spine was performed following the standard protocol without intravenous contrast. Multiplanar CT image reconstructions of the cervical spine were also generated. COMPARISON:  CT head dated 07/13/2016 FINDINGS: CT HEAD FINDINGS Brain: No evidence of acute infarction, hemorrhage, hydrocephalus, extra-axial collection or mass lesion/mass effect. Global cortical and central atrophy. Secondary ventricular prominence. Subcortical white matter and periventricular small vessel ischemic changes. Vascular: Intracranial atherosclerosis. Left vertebral artery aneurysm clips. Skull: Prior suboccipital craniectomy. Negative for fracture focal lesion. Sinuses/Orbits: Partial opacification of the right maxillary sinus. Visualized paranasal sinuses mastoid air cells are otherwise clear. Other: None. CT CERVICAL SPINE FINDINGS Alignment: Normal cervical lordosis. Skull base and vertebrae: No acute fracture. No primary bone lesion or focal pathologic process. Soft tissues and spinal canal: No prevertebral fluid or swelling. No visible canal hematoma. Disc levels: Moderate multilevel degenerative changes. Spinal canal is patent. Upper chest: Visualized lung apices are notable for right apical pleural-parenchymal scarring. Other: Visualized thyroid is notable for a 13 mm right thyroid nodule. IMPRESSION: No evidence of acute intracranial abnormality. Atrophy with small vessel ischemic changes. Prior suboccipital craniectomy and postprocedural changes related to prior left vertebral artery aneurysm clipping. No evidence medic injury to cervical spine. Moderate multilevel degenerative changes. Electronically Signed   By: Julian Hy M.D.   On: 11/30/2018 14:34   Dg Shoulder Left  Result Date: 11/30/2018 CLINICAL DATA:  Acute LEFT shoulder pain following fall. Initial encounter.  EXAM: LEFT SHOULDER - 2+ VIEW COMPARISON:  None. FINDINGS: No acute fracture, subluxation or dislocation. Degenerative changes at the glenohumeral and AC joints noted. No focal bony lesions are present. IMPRESSION: No acute abnormality. Electronically Signed   By: Margarette Canada M.D.   On: 11/30/2018 14:16   Dg Knee Complete 4 Views Left  Result Date: 11/30/2018 CLINICAL DATA:  Acute LEFT knee pain following fall. Initial encounter. EXAM: LEFT KNEE - COMPLETE 4+ VIEW COMPARISON:  12/19/2017 radiographs FINDINGS: MEDIAL compartment hemiarthroplasty again noted. No acute fracture or dislocation noted. No definite knee effusion noted. Moderate to severe degenerative changes in the LATERAL and patellofemoral compartment again identified. IMPRESSION: 1. No evidence of acute abnormality. Electronically Signed   By: Margarette Canada M.D.   On: 11/30/2018 14:06   Dg Knee Complete 4 Views Right  Result Date: 11/30/2018 CLINICAL DATA:  Acute RIGHT knee pain following fall. Initial encounter. EXAM: RIGHT KNEE - COMPLETE 4+ VIEW COMPARISON:  None. FINDINGS: RIGHT total knee arthroplasty noted. No evidence acute fracture  or dislocation. No joint effusion noted. No bony lesions are present. IMPRESSION: 1. No evidence of acute abnormality. Electronically Signed   By: Margarette Canada M.D.   On: 11/30/2018 14:15   Dg Foot Complete Left  Result Date: 11/30/2018 CLINICAL DATA:  Acute LEFT foot pain following fall. Initial encounter. EXAM: LEFT FOOT - COMPLETE 3+ VIEW COMPARISON:  None. FINDINGS: No acute fracture, subluxation or dislocation. Mild degenerative changes at the 1st MTP joint noted. No focal bony lesions are present. IMPRESSION: No evidence of acute bony abnormality. Electronically Signed   By: Margarette Canada M.D.   On: 11/30/2018 14:14   Dg Foot Complete Right  Result Date: 11/30/2018 CLINICAL DATA:  Acute RIGHT foot pain following fall. Initial encounter. EXAM: RIGHT FOOT COMPLETE - 3+ VIEW COMPARISON:  None.  FINDINGS: No acute fracture, subluxation or dislocation. Mild hallux valgus and degenerative changes of 1st MTP joint noted. A small calcaneal spur is present. No soft tissue swelling noted. IMPRESSION: 1. No evidence of acute abnormality. Electronically Signed   By: Margarette Canada M.D.   On: 11/30/2018 14:12    EKG: Independently reviewed.  Sinus rhythm at 63 bpm with QTc 503  Assessment/Plan Traumatic rhabdomyolysis secondary to fall: Acute.  Patient presents after tripping over a rug at home.  She was unable to get out for over 12 hours.  Imaging studies did not show any acute fractures.  Patient had been given 1 L of normal saline IV fluids and then placed on a sodium bicarb drip for 1 L. -Admit to a MedSurg bed -Strict bedrest -Monitor intake and output -Continue normal saline IV fluids 125 mL/h after completion of -Hydrocodone as needed for severe pain -Daily monitoring of CK -Will benefit from evaluation from PT/OT once rhabdomyolysis has resolved  Leukocytosis: Acute.  WBC 12.7 on admission.  -Recheck in a.m.  Cellulitis: Acute.  Patient with redness and erythema around skin abrasions related with fall. -Rocephin IV  Abnormal urinalysis: Urinalysis positive for hemoglobin and nitrite positive. -Add on urine culture -On antibiotics of Rocephin  Prolonged QT interval: Acute.  Initial QTc 503 on admission -Correct electrolyte abnormality -Check EKG in a.m.  Hypokalemia: Acute.  Initial potassium 3.3. -Give 40 mEq of potassium chloride x1 dose now -Continue to monitor and replace as needed  Transaminitis: Acute.  AST and ALT evaluate.  Likely secondary to rhabdomyolysis. -Recheck CMP in a.m.  Hypertensive urgency: Acute. Patient not currently on any medications.  Blood pressures elevated up to 187/61. -Hydralazine IV prn  Diabetes mellitus type 2: Last hemoglobin A1c noted to be 6.4 on 04/2017.  Patient not on any diabetic medications. -Hypoglycemic protocols -CBGs before  every meal with sensitive SSI   History of aneurysm s/p clipping  Arthritis -Consider Voltaren gel  DVT prophylaxis: Lovenox Code Status: Full Family Communication: No family present at bedside Disposition Plan: To be determined Consults called: None Admission status: Observation  Norval Morton MD [facility] Pager [phone]   If 7PM-7AM, please contact night-coverage [email or web address] Password [email or web address]  11/30/2018, 2:56 PM

## 2018-11-30 NOTE — ED Provider Notes (Signed)
[facility] EMERGENCY DEPARTMENT Provider Note   CSN: [id number] Arrival date & time: 11/30/18  1227     History   Chief Complaint Chief Complaint  Patient presents with  . Fall    HPI Kristina Wilkinson is a 85 y.o. female.     Pt presents to the ED today with a fall which occurred yesterday.  Pt's son could not get ahold of her today, so a wellness check was performed.  Pt was found on the ground in the kitchen.  Pt said she tripped on a rug that was in front of her sink.  She was unable to get up.  She fell yesterday afternoon.  Pt c/o bilateral shoulder, knee, feet, and lbp.  Pt denies hitting her head or having a loc.     Past Medical History:  Diagnosis Date  . Arthritis   . CNS aneurysm 1983   s/p clipping  . Diabetes mellitus type II    diet controlled   . DVT (deep venous thrombosis) ([place]) 10/15  . Elevated glucose   . Hypertension   . Insomnia   . Knee pain    Left Dr. Meyer Cory  . LBP (low back pain)   . PN (peripheral neuropathy)    unknown etiol    Patient Active Problem List   Diagnosis Date Noted  . Scalp laceration   . Skull fracture ([place]) 07/13/2016  . Chronic left shoulder pain 04/03/2016  . Diabetes mellitus without complication ([place]) 0000000  . Hearing loss 06/21/2015  . Advance care planning 12/17/2014  . Palpitations 07/06/2014  . Pain in joint, ankle and foot 04/20/2014  . DVT, lower extremity ([place]) 11/19/2013  . Edema 10/20/2013  . Headache(784.0) 10/20/2013  . Left leg swelling 10/20/2013  . Aneurysm of basilar artery ([facility]) 10/20/2013  . Bradycardia 10/20/2013  . Vitamin B12 deficiency 07/16/2013  . Paresthesia of both feet 05/17/2013  . TMJ arthritis 09/11/2012  . Ear pain 09/11/2012  . History of transfusion of packed red blood cells 09/11/2012  . Spinal stenosis 04/13/2011  . Well adult exam 01/23/2011  . Cholecystitis with cholelithiasis, s/p lap chole 11/10/2010 12/01/2010  . Shoulder pain, right  07/25/2010  . TOBACCO USE, QUIT 03/09/2009  . KNEE PAIN 05/18/2008  . PN (peripheral neuropathy) 08/19/2007  . LEG PAIN 08/19/2007  . Vitamin D deficiency 02/17/2007  . INSOMNIA, PERSISTENT 02/17/2007  . Essential hypertension 02/17/2007  . GERD 02/17/2007  . OSTEOARTHRITIS 02/17/2007  . LOW BACK PAIN 02/17/2007  . Hyperglycemia 01/28/2007  . UNSPECIFIED OSTEOPOROSIS 01/28/2007  . PRECIPITOUS DROP IN HEMATOCRIT 01/28/2007    Past Surgical History:  Procedure Laterality Date  . CEREBRAL ANEURYSM REPAIR     initial repair in the 1980s, with second surgery in the 1990s  . CHOLECYSTECTOMY    . JOINT REPLACEMENT    . KNEE ARTHROPLASTY  04/2009   Left  . TOTAL KNEE ARTHROPLASTY    . TUBAL LIGATION       OB History   No obstetric history on file.      Home Medications    Prior to Admission medications   Medication Sig Start Date End Date Taking? Authorizing Provider  amitriptyline (ELAVIL) 25 MG tablet Take by mouth. 04/03/16   [provider]  Polyethyl Glycol-Propyl Glycol (SYSTANE) 0.4-0.3 % SOLN Place 1 drop into both eyes 2 (two) times daily.    [provider]  sulindac (CLINORIL) 150 MG tablet Take by mouth. 04/03/16   [provider]  Family History Family History  Problem Relation Age of Onset  . Arthritis Mother   . Stroke Mother   . Stroke Father   . Arthritis-Osteo Son     Social History Social History   Tobacco Use  . Smoking status: Former Research scientist (life sciences)  . Smokeless tobacco: Never Used  . Tobacco comment: quit in 1983  Substance Use Topics  . Alcohol use: No    Alcohol/week: 0.0 standard drinks  . Drug use: No     Allergies   Gabapentin, Lyrica [pregabalin], Penicillins, and Vit d-vit e-safflower oil   Review of Systems Review of Systems  Musculoskeletal: Positive for back pain.       Bilateral shoulder, knee pain  All other systems reviewed and are negative.    Physical Exam Updated Vital Signs BP (!) 183/67    Pulse 64   Temp (!) 97.5 F (36.4 C) (Oral)   Resp 15   SpO2 97%   Physical Exam Vitals signs and nursing note reviewed.  Constitutional:      Appearance: Normal appearance.  HENT:     Head: Normocephalic and atraumatic.     Right Ear: External ear normal.     Left Ear: External ear normal.     Nose: Nose normal.     Mouth/Throat:     Mouth: Mucous membranes are dry.  Eyes:     Extraocular Movements: Extraocular movements intact.     Conjunctiva/sclera: Conjunctivae normal.     Pupils: Pupils are equal, round, and reactive to light.  Neck:     Musculoskeletal: Normal range of motion and neck supple.  Cardiovascular:     Rate and Rhythm: Normal rate and regular rhythm.     Pulses: Normal pulses.     Heart sounds: Normal heart sounds.  Pulmonary:     Effort: Pulmonary effort is normal.     Breath sounds: Normal breath sounds.  Abdominal:     General: Abdomen is flat. Bowel sounds are normal.     Palpations: Abdomen is soft.  Musculoskeletal:     Comments: Bruises to both shoulders, both knees, both feet  Skin:    Capillary Refill: Capillary refill takes less than 2 seconds.     Comments: Skin tear right upper arm, skin breakdown both feet  Neurological:     General: No focal deficit present.     Mental Status: She is alert and oriented to person, place, and time.  Psychiatric:        Mood and Affect: Mood normal.        Behavior: Behavior normal.      ED Treatments / Results  Labs (all labs ordered are listed, but only abnormal results are displayed) Labs Reviewed  COMPREHENSIVE METABOLIC PANEL - Abnormal; Notable for the following components:      Result Value   Potassium 3.3 (*)    Glucose, Bld 145 (*)    BUN 26 (*)    Total Protein 6.2 (*)    AST 154 (*)    ALT 49 (*)    Total Bilirubin 2.0 (*)    GFR calc non Af Amer 59 (*)    All other components within normal limits  CBC WITH DIFFERENTIAL/PLATELET - Abnormal; Notable for the following components:    WBC 12.7 (*)    Neutro Abs 10.5 (*)    Monocytes Absolute 1.4 (*)    All other components within normal limits  CK - Abnormal; Notable for the following components:   Total CK  6,972 (*)    All other components within normal limits  SARS CORONAVIRUS 2 (TAT 6-24 HRS)  URINALYSIS, ROUTINE W REFLEX MICROSCOPIC    EKG EKG Interpretation  Date/Time:  Sunday November 30 2018 12:51:17 EDT Ventricular Rate:  63 PR Interval:    QRS Duration: 78 QT Interval:  491 QTC Calculation: 503 R Axis:   -24 Text Interpretation:  Normal sinus rhythm Borderline left axis deviation Prolonged QT interval Artifact in lead(s) I II III aVR aVL V1 Confirmed by Isla Pence [phone]) on 11/30/2018 1:00:43 PM   Radiology Dg Chest 2 View  Result Date: 11/30/2018 CLINICAL DATA:  Unwitnessed fall. EXAM: CHEST - 2 VIEW COMPARISON:  09/11/2012 chest radiograph FINDINGS: This is a low volume film with mild bibasilar atelectasis. There is no evidence of focal airspace disease, pulmonary edema, suspicious pulmonary nodule/mass, pleural effusion, or pneumothorax. No acute bony abnormalities are identified. IMPRESSION: Low volume film with mild bibasilar atelectasis. Electronically Signed   By: Margarette Canada M.D.   On: 11/30/2018 14:17   Dg Lumbar Spine Complete  Result Date: 11/30/2018 CLINICAL DATA:  Acute low back pain following fall. Initial encounter. EXAM: LUMBAR SPINE - COMPLETE 4+ VIEW COMPARISON:  None. FINDINGS: No acute fracture or subluxation. Moderate multilevel degenerative disc disease and facet arthropathy again noted. No focal bony lesions are identified. Aortic atherosclerotic calcifications are noted. IMPRESSION: 1. No evidence of acute bony abnormality. 2. Moderate multilevel degenerative disc disease and facet arthropathy. 3.  Aortic Atherosclerosis (ICD10-I70.0). Electronically Signed   By: Margarette Canada M.D.   On: 11/30/2018 14:10   Dg Pelvis 1-2 Views  Result Date: 11/30/2018 CLINICAL DATA:  Acute  pelvic pain following fall. Initial encounter. EXAM: PELVIS - 1-2 VIEW COMPARISON:  None. FINDINGS: There is no evidence of pelvic fracture or diastasis. No pelvic bone lesions are seen. IMPRESSION: Negative. Electronically Signed   By: Margarette Canada M.D.   On: 11/30/2018 14:07   Dg Shoulder Right  Result Date: 11/30/2018 CLINICAL DATA:  Acute RIGHT shoulder pain following fall. Initial encounter. EXAM: RIGHT SHOULDER - 2+ VIEW COMPARISON:  None. FINDINGS: No acute fracture, subluxation or dislocation. Degenerative changes at the glenohumeral and AC joints noted. No focal bony lesions are present. IMPRESSION: No evidence of acute bony abnormality. Electronically Signed   By: Margarette Canada M.D.   On: 11/30/2018 14:03   Ct Head Wo Contrast  Result Date: 11/30/2018 CLINICAL DATA:  Fall, headache EXAM: CT HEAD WITHOUT CONTRAST CT CERVICAL SPINE WITHOUT CONTRAST TECHNIQUE: Multidetector CT imaging of the head and cervical spine was performed following the standard protocol without intravenous contrast. Multiplanar CT image reconstructions of the cervical spine were also generated. COMPARISON:  CT head dated 07/13/2016 FINDINGS: CT HEAD FINDINGS Brain: No evidence of acute infarction, hemorrhage, hydrocephalus, extra-axial collection or mass lesion/mass effect. Global cortical and central atrophy. Secondary ventricular prominence. Subcortical white matter and periventricular small vessel ischemic changes. Vascular: Intracranial atherosclerosis. Left vertebral artery aneurysm clips. Skull: Prior suboccipital craniectomy. Negative for fracture focal lesion. Sinuses/Orbits: Partial opacification of the right maxillary sinus. Visualized paranasal sinuses mastoid air cells are otherwise clear. Other: None. CT CERVICAL SPINE FINDINGS Alignment: Normal cervical lordosis. Skull base and vertebrae: No acute fracture. No primary bone lesion or focal pathologic process. Soft tissues and spinal canal: No prevertebral fluid or  swelling. No visible canal hematoma. Disc levels: Moderate multilevel degenerative changes. Spinal canal is patent. Upper chest: Visualized lung apices are notable for right apical pleural-parenchymal scarring. Other: Visualized thyroid is notable  for a 13 mm right thyroid nodule. IMPRESSION: No evidence of acute intracranial abnormality. Atrophy with small vessel ischemic changes. Prior suboccipital craniectomy and postprocedural changes related to prior left vertebral artery aneurysm clipping. No evidence medic injury to cervical spine. Moderate multilevel degenerative changes. Electronically Signed   By: Julian Hy M.D.   On: 11/30/2018 14:34   Ct Cervical Spine Wo Contrast  Result Date: 11/30/2018 CLINICAL DATA:  Fall, headache EXAM: CT HEAD WITHOUT CONTRAST CT CERVICAL SPINE WITHOUT CONTRAST TECHNIQUE: Multidetector CT imaging of the head and cervical spine was performed following the standard protocol without intravenous contrast. Multiplanar CT image reconstructions of the cervical spine were also generated. COMPARISON:  CT head dated 07/13/2016 FINDINGS: CT HEAD FINDINGS Brain: No evidence of acute infarction, hemorrhage, hydrocephalus, extra-axial collection or mass lesion/mass effect. Global cortical and central atrophy. Secondary ventricular prominence. Subcortical white matter and periventricular small vessel ischemic changes. Vascular: Intracranial atherosclerosis. Left vertebral artery aneurysm clips. Skull: Prior suboccipital craniectomy. Negative for fracture focal lesion. Sinuses/Orbits: Partial opacification of the right maxillary sinus. Visualized paranasal sinuses mastoid air cells are otherwise clear. Other: None. CT CERVICAL SPINE FINDINGS Alignment: Normal cervical lordosis. Skull base and vertebrae: No acute fracture. No primary bone lesion or focal pathologic process. Soft tissues and spinal canal: No prevertebral fluid or swelling. No visible canal hematoma. Disc levels:  Moderate multilevel degenerative changes. Spinal canal is patent. Upper chest: Visualized lung apices are notable for right apical pleural-parenchymal scarring. Other: Visualized thyroid is notable for a 13 mm right thyroid nodule. IMPRESSION: No evidence of acute intracranial abnormality. Atrophy with small vessel ischemic changes. Prior suboccipital craniectomy and postprocedural changes related to prior left vertebral artery aneurysm clipping. No evidence medic injury to cervical spine. Moderate multilevel degenerative changes. Electronically Signed   By: Julian Hy M.D.   On: 11/30/2018 14:34   Dg Shoulder Left  Result Date: 11/30/2018 CLINICAL DATA:  Acute LEFT shoulder pain following fall. Initial encounter. EXAM: LEFT SHOULDER - 2+ VIEW COMPARISON:  None. FINDINGS: No acute fracture, subluxation or dislocation. Degenerative changes at the glenohumeral and AC joints noted. No focal bony lesions are present. IMPRESSION: No acute abnormality. Electronically Signed   By: Margarette Canada M.D.   On: 11/30/2018 14:16   Dg Knee Complete 4 Views Left  Result Date: 11/30/2018 CLINICAL DATA:  Acute LEFT knee pain following fall. Initial encounter. EXAM: LEFT KNEE - COMPLETE 4+ VIEW COMPARISON:  12/19/2017 radiographs FINDINGS: MEDIAL compartment hemiarthroplasty again noted. No acute fracture or dislocation noted. No definite knee effusion noted. Moderate to severe degenerative changes in the LATERAL and patellofemoral compartment again identified. IMPRESSION: 1. No evidence of acute abnormality. Electronically Signed   By: Margarette Canada M.D.   On: 11/30/2018 14:06   Dg Knee Complete 4 Views Right  Result Date: 11/30/2018 CLINICAL DATA:  Acute RIGHT knee pain following fall. Initial encounter. EXAM: RIGHT KNEE - COMPLETE 4+ VIEW COMPARISON:  None. FINDINGS: RIGHT total knee arthroplasty noted. No evidence acute fracture or dislocation. No joint effusion noted. No bony lesions are present. IMPRESSION: 1.  No evidence of acute abnormality. Electronically Signed   By: Margarette Canada M.D.   On: 11/30/2018 14:15   Dg Foot Complete Left  Result Date: 11/30/2018 CLINICAL DATA:  Acute LEFT foot pain following fall. Initial encounter. EXAM: LEFT FOOT - COMPLETE 3+ VIEW COMPARISON:  None. FINDINGS: No acute fracture, subluxation or dislocation. Mild degenerative changes at the 1st MTP joint noted. No focal bony lesions are present. IMPRESSION:  No evidence of acute bony abnormality. Electronically Signed   By: Margarette Canada M.D.   On: 11/30/2018 14:14   Dg Foot Complete Right  Result Date: 11/30/2018 CLINICAL DATA:  Acute RIGHT foot pain following fall. Initial encounter. EXAM: RIGHT FOOT COMPLETE - 3+ VIEW COMPARISON:  None. FINDINGS: No acute fracture, subluxation or dislocation. Mild hallux valgus and degenerative changes of 1st MTP joint noted. A small calcaneal spur is present. No soft tissue swelling noted. IMPRESSION: 1. No evidence of acute abnormality. Electronically Signed   By: Margarette Canada M.D.   On: 11/30/2018 14:12    Procedures Procedures (including critical care time)  Medications Ordered in ED Medications  sodium bicarbonate 150 mEq in sterile water 1,000 mL infusion (has no administration in time range)  sodium chloride 0.9 % bolus 1,000 mL (1,000 mLs Intravenous New Bag/Given 11/30/18 1429)     Initial Impression / Assessment and Plan / ED Course  I have reviewed the triage vital signs and the nursing notes.  Pertinent labs & imaging results that were available during my care of the patient were reviewed by me and considered in my medical decision making (see chart for details).  Pt does have rhabdomyolysis and is started on IVFs and a bicarb drip.  Pt d/w Dr. Tamala Julian ([facility]ad) for admission.  Final Clinical Impressions(s) / ED Diagnoses   Final diagnoses:  Traumatic rhabdomyolysis, initial encounter [facility])  Fall, initial encounter  Multiple contusions    ED Discharge Orders     None       Isla Pence, MD 11/30/18 1510

## 2018-11-30 NOTE — ED Triage Notes (Signed)
Pt arrives from home with complaints of unwitnessed fall. Pt lives at home by self. Son had not heard from her. Wellness check performed. Pt was found on floor d/t a mechanical fall. Pt down since 1400 on 11/29/18. Pt denies blood thinners. Alert and oriented X4. Bruising noted on bilateral shoulders.

## 2018-12-01 DIAGNOSIS — E876 Hypokalemia: Secondary | ICD-10-CM | POA: Diagnosis not present

## 2018-12-01 DIAGNOSIS — R8290 Unspecified abnormal findings in urine: Secondary | ICD-10-CM | POA: Diagnosis not present

## 2018-12-01 DIAGNOSIS — S91109A Unspecified open wound of unspecified toe(s) without damage to nail, initial encounter: Secondary | ICD-10-CM | POA: Diagnosis present

## 2018-12-01 DIAGNOSIS — R531 Weakness: Secondary | ICD-10-CM | POA: Diagnosis present

## 2018-12-01 DIAGNOSIS — Z888 Allergy status to other drugs, medicaments and biological substances status: Secondary | ICD-10-CM | POA: Diagnosis not present

## 2018-12-01 DIAGNOSIS — S9032XA Contusion of left foot, initial encounter: Secondary | ICD-10-CM | POA: Diagnosis present

## 2018-12-01 DIAGNOSIS — M25512 Pain in left shoulder: Secondary | ICD-10-CM | POA: Diagnosis not present

## 2018-12-01 DIAGNOSIS — B964 Proteus (mirabilis) (morganii) as the cause of diseases classified elsewhere: Secondary | ICD-10-CM | POA: Diagnosis present

## 2018-12-01 DIAGNOSIS — T796XXA Traumatic ischemia of muscle, initial encounter: Secondary | ICD-10-CM | POA: Diagnosis present

## 2018-12-01 DIAGNOSIS — E1142 Type 2 diabetes mellitus with diabetic polyneuropathy: Secondary | ICD-10-CM | POA: Diagnosis present

## 2018-12-01 DIAGNOSIS — M7731 Calcaneal spur, right foot: Secondary | ICD-10-CM | POA: Diagnosis not present

## 2018-12-01 DIAGNOSIS — S91112A Laceration without foreign body of left great toe without damage to nail, initial encounter: Secondary | ICD-10-CM | POA: Diagnosis present

## 2018-12-01 DIAGNOSIS — S8002XA Contusion of left knee, initial encounter: Secondary | ICD-10-CM | POA: Diagnosis present

## 2018-12-01 DIAGNOSIS — S90932D Unspecified superficial injury of left great toe, subsequent encounter: Secondary | ICD-10-CM | POA: Diagnosis not present

## 2018-12-01 DIAGNOSIS — T796XXD Traumatic ischemia of muscle, subsequent encounter: Secondary | ICD-10-CM | POA: Diagnosis not present

## 2018-12-01 DIAGNOSIS — H9190 Unspecified hearing loss, unspecified ear: Secondary | ICD-10-CM | POA: Diagnosis not present

## 2018-12-01 DIAGNOSIS — G8191 Hemiplegia, unspecified affecting right dominant side: Secondary | ICD-10-CM | POA: Diagnosis not present

## 2018-12-01 DIAGNOSIS — E119 Type 2 diabetes mellitus without complications: Secondary | ICD-10-CM | POA: Diagnosis not present

## 2018-12-01 DIAGNOSIS — M47812 Spondylosis without myelopathy or radiculopathy, cervical region: Secondary | ICD-10-CM | POA: Diagnosis not present

## 2018-12-01 DIAGNOSIS — G319 Degenerative disease of nervous system, unspecified: Secondary | ICD-10-CM | POA: Diagnosis not present

## 2018-12-01 DIAGNOSIS — S40012A Contusion of left shoulder, initial encounter: Secondary | ICD-10-CM | POA: Diagnosis present

## 2018-12-01 DIAGNOSIS — S41111A Laceration without foreign body of right upper arm, initial encounter: Secondary | ICD-10-CM | POA: Diagnosis present

## 2018-12-01 DIAGNOSIS — E041 Nontoxic single thyroid nodule: Secondary | ICD-10-CM | POA: Diagnosis not present

## 2018-12-01 DIAGNOSIS — W208XXA Other cause of strike by thrown, projected or falling object, initial encounter: Secondary | ICD-10-CM | POA: Diagnosis present

## 2018-12-01 DIAGNOSIS — S91109D Unspecified open wound of unspecified toe(s) without damage to nail, subsequent encounter: Secondary | ICD-10-CM | POA: Diagnosis not present

## 2018-12-01 DIAGNOSIS — M5136 Other intervertebral disc degeneration, lumbar region: Secondary | ICD-10-CM | POA: Diagnosis not present

## 2018-12-01 DIAGNOSIS — M545 Low back pain: Secondary | ICD-10-CM | POA: Diagnosis present

## 2018-12-01 DIAGNOSIS — M25511 Pain in right shoulder: Secondary | ICD-10-CM | POA: Diagnosis not present

## 2018-12-01 DIAGNOSIS — T07XXXA Unspecified multiple injuries, initial encounter: Secondary | ICD-10-CM | POA: Diagnosis not present

## 2018-12-01 DIAGNOSIS — I160 Hypertensive urgency: Secondary | ICD-10-CM | POA: Diagnosis present

## 2018-12-01 DIAGNOSIS — S90935D Unspecified superficial injury of left lesser toe(s), subsequent encounter: Secondary | ICD-10-CM | POA: Diagnosis not present

## 2018-12-01 DIAGNOSIS — S90934D Unspecified superficial injury of right lesser toe(s), subsequent encounter: Secondary | ICD-10-CM | POA: Diagnosis not present

## 2018-12-01 DIAGNOSIS — G8929 Other chronic pain: Secondary | ICD-10-CM | POA: Diagnosis present

## 2018-12-01 DIAGNOSIS — M4050 Lordosis, unspecified, site unspecified: Secondary | ICD-10-CM | POA: Diagnosis not present

## 2018-12-01 DIAGNOSIS — L03032 Cellulitis of left toe: Secondary | ICD-10-CM | POA: Diagnosis present

## 2018-12-01 DIAGNOSIS — I6782 Cerebral ischemia: Secondary | ICD-10-CM | POA: Diagnosis not present

## 2018-12-01 DIAGNOSIS — S9031XA Contusion of right foot, initial encounter: Secondary | ICD-10-CM | POA: Diagnosis present

## 2018-12-01 DIAGNOSIS — Y92010 Kitchen of single-family (private) house as the place of occurrence of the external cause: Secondary | ICD-10-CM | POA: Diagnosis not present

## 2018-12-01 DIAGNOSIS — J9811 Atelectasis: Secondary | ICD-10-CM | POA: Diagnosis not present

## 2018-12-01 DIAGNOSIS — R9431 Abnormal electrocardiogram [ECG] [EKG]: Secondary | ICD-10-CM | POA: Diagnosis present

## 2018-12-01 DIAGNOSIS — W19XXXA Unspecified fall, initial encounter: Secondary | ICD-10-CM | POA: Diagnosis not present

## 2018-12-01 DIAGNOSIS — I10 Essential (primary) hypertension: Secondary | ICD-10-CM | POA: Diagnosis present

## 2018-12-01 DIAGNOSIS — N390 Urinary tract infection, site not specified: Secondary | ICD-10-CM | POA: Diagnosis present

## 2018-12-01 DIAGNOSIS — W010XXA Fall on same level from slipping, tripping and stumbling without subsequent striking against object, initial encounter: Secondary | ICD-10-CM | POA: Diagnosis present

## 2018-12-01 DIAGNOSIS — M47816 Spondylosis without myelopathy or radiculopathy, lumbar region: Secondary | ICD-10-CM | POA: Diagnosis not present

## 2018-12-01 DIAGNOSIS — S50901D Unspecified superficial injury of right elbow, subsequent encounter: Secondary | ICD-10-CM | POA: Diagnosis not present

## 2018-12-01 DIAGNOSIS — S8001XA Contusion of right knee, initial encounter: Secondary | ICD-10-CM | POA: Diagnosis present

## 2018-12-01 DIAGNOSIS — D72829 Elevated white blood cell count, unspecified: Secondary | ICD-10-CM | POA: Diagnosis not present

## 2018-12-01 DIAGNOSIS — Z20828 Contact with and (suspected) exposure to other viral communicable diseases: Secondary | ICD-10-CM | POA: Diagnosis present

## 2018-12-01 DIAGNOSIS — S40011A Contusion of right shoulder, initial encounter: Secondary | ICD-10-CM | POA: Diagnosis present

## 2018-12-01 DIAGNOSIS — Z880 Allergy status to penicillin: Secondary | ICD-10-CM | POA: Diagnosis not present

## 2018-12-01 DIAGNOSIS — R7401 Elevation of levels of liver transaminase levels: Secondary | ICD-10-CM | POA: Diagnosis present

## 2018-12-01 DIAGNOSIS — J984 Other disorders of lung: Secondary | ICD-10-CM | POA: Diagnosis not present

## 2018-12-01 DIAGNOSIS — I700 Atherosclerosis of aorta: Secondary | ICD-10-CM | POA: Diagnosis not present

## 2018-12-01 LAB — CBC
HCT: 32.9 % — ABNORMAL LOW (ref 36.0–46.0)
Hemoglobin: 11.3 g/dL — ABNORMAL LOW (ref 12.0–15.0)
MCH: 31.1 pg (ref 26.0–34.0)
MCHC: 34.3 g/dL (ref 30.0–36.0)
MCV: 90.6 fL (ref 80.0–100.0)
Platelets: 147 10*3/uL — ABNORMAL LOW (ref 150–400)
RBC: 3.63 MIL/uL — ABNORMAL LOW (ref 3.87–5.11)
RDW: 12.7 % (ref 11.5–15.5)
WBC: 9.7 10*3/uL (ref 4.0–10.5)
nRBC: 0 % (ref 0.0–0.2)

## 2018-12-01 LAB — COMPREHENSIVE METABOLIC PANEL
ALT: 50 U/L — ABNORMAL HIGH (ref 0–44)
AST: 117 U/L — ABNORMAL HIGH (ref 15–41)
Albumin: 2.8 g/dL — ABNORMAL LOW (ref 3.5–5.0)
Alkaline Phosphatase: 56 U/L (ref 38–126)
Anion gap: 8 (ref 5–15)
BUN: 25 mg/dL — ABNORMAL HIGH (ref 8–23)
CO2: 25 mmol/L (ref 22–32)
Calcium: 8.8 mg/dL — ABNORMAL LOW (ref 8.9–10.3)
Chloride: 112 mmol/L — ABNORMAL HIGH (ref 98–111)
Creatinine, Ser: 0.72 mg/dL (ref 0.44–1.00)
GFR calc Af Amer: >60 mL/min (ref >60)
GFR calc non Af Amer: >60 mL/min (ref >60)
Glucose, Bld: 100 mg/dL — ABNORMAL HIGH (ref 70–99)
Potassium: 3.4 mmol/L — ABNORMAL LOW (ref 3.5–5.1)
Sodium: 145 mmol/L (ref 135–145)
Total Bilirubin: 1.2 mg/dL (ref 0.3–1.2)
Total Protein: 4.7 g/dL — ABNORMAL LOW (ref 6.5–8.1)

## 2018-12-01 LAB — HEMOGLOBIN A1C
Hgb A1c MFr Bld: 6.2 % — ABNORMAL HIGH (ref 4.8–5.6)
Mean Plasma Glucose: 131 mg/dL

## 2018-12-01 LAB — GLUCOSE, CAPILLARY
Glucose-Capillary: 110 mg/dL — ABNORMAL HIGH (ref 70–99)
Glucose-Capillary: 149 mg/dL — ABNORMAL HIGH (ref 70–99)
Glucose-Capillary: 125 mg/dL — ABNORMAL HIGH (ref 70–99)
Glucose-Capillary: 137 mg/dL — ABNORMAL HIGH (ref 70–99)

## 2018-12-01 LAB — CK: Total CK: 1634 U/L — ABNORMAL HIGH (ref 38–234)

## 2018-12-01 MED ORDER — POTASSIUM CHLORIDE CRYS ER 20 MEQ PO TBCR
40.0000 meq | EXTENDED_RELEASE_TABLET | ORAL | Status: AC
  Administered 2018-12-01 (×2): 40 meq via ORAL
  Filled 2018-12-01 (×2): qty 2

## 2018-12-01 MED ORDER — MUPIROCIN CALCIUM 2 % EX CREA
TOPICAL_CREAM | Freq: Every day | CUTANEOUS | Status: DC
  Administered 2018-12-02 – 2018-12-03 (×2): via TOPICAL
  Administered 2018-12-04: 1 via TOPICAL
  Administered 2018-12-05: 10:00:00 via TOPICAL
  Filled 2018-12-01 (×3): qty 15

## 2018-12-01 MED ORDER — SODIUM CHLORIDE 0.9 % IV SOLN: INTRAVENOUS | Status: AC

## 2018-12-01 MED ORDER — MAGNESIUM SULFATE 2 GM/50ML IV SOLN
2.0000 g | Freq: Once | INTRAVENOUS | Status: AC
  Administered 2018-12-01: 2 g via INTRAVENOUS
  Filled 2018-12-01: qty 50

## 2018-12-01 NOTE — Progress Notes (Signed)
[facility] PROGRESS NOTE  Kristina Wilkinson [id number] DOB: 10-12-1927 DOA: 11/30/2018 PCP: Elby Beck, FNP  Assessment/Plan:  Traumatic rhabdomyolysis secondary to fall: Acute.  Patient presented after tripping over a rug at home.  She was unable to get up for over 12 hours.  Imaging studies did not show any acute fractures.  Patient had been given 1 L of normal saline IV fluids and then placed on a sodium bicarb drip for 1 L. CK trending down to greater than 1600 down from 6000. -Monitor intake and output -Continue normal saline IV fluids at slightly lower rate -Hydrocodone as needed for severe pain -Daily monitoring of CK - PT/OT   Leukocytosis: Acute.  WBC 12.7 on admission. Likely related to above. WBC within limits of normal this am. Patient is afebrile and non-toxic appearing.  Cellulitis/ left great toe wound: Acute.  Patient with redness and erythema around skin abrasions related with fall. Also with wounds to left great toe and second toe due to dropping a can on toe last week. Xray without fx. Evaluated by wound care who recommend topical treatment. Patient is afebrile and non-toxic appearing - continue Rocephin IV  Abnormal urinalysis: Urinalysis positive for hemoglobin and nitrite positive. -follow  urine culture -continue Rocephin  Prolonged QT interval: Acute.  Initial QTc 503 on admission -follow repeat ekg  Hypokalemia: Acute.  Initial potassium 3.3. trending up this am but remains low. Mag level 1.8 -Give 40 mEq of potassium chloride x1 dose now -IV mag -Continue to monitor and replace as needed  Transaminitis: Acute.  AST and ALT evaluate.  Likely secondary to rhabdomyolysis. Trending down this am.  -Recheck CMP in a.m.  Hypertensive urgency: resolved this am but BP remains high end of normal.  Patient not currently on any medications. -Hydralazine IV prn -OP follow up  Diabetes mellitus type 2: Last hemoglobin A1c noted to be 6.4 on  04/2017.  Patient not on any diabetic medications. -Hypoglycemic protocols -CBGs before every meal with sensitive SSI   History of aneurysm s/p clipping  Arthritis -Consider Voltaren gel  Code Status: full Family Communication: patient Disposition Plan: home or rehab hopefully tomorrow   Consultants:    Procedures:    Antibiotics:  Rocephin 10/18>>  HPI/Subjective: Sitting up in bed. Alert. She reports that she aches "all over, but that is nothing new". Reports her feet particularly tender  Objective: Vitals:   12/01/18 0546 12/01/18 0833  BP: (!) 149/62 (!) 156/58  Pulse: 73 79  Resp: 16 20  Temp: 99.1 F (37.3 C) 98.8 F (37.1 C)  SpO2: 93% 92%    Intake/Output Summary (Last 24 hours) at 12/01/2018 1214 Last data filed at 12/01/2018 0827 Gross per 24 hour  Intake 1845.39 ml  Output 150 ml  Net 1695.39 ml   There were no vitals filed for this visit.  Exam:   General:  Obese alert no acute distress  Cardiovascular: rrr no mgr trace LE edema  Respiratory: normal effort BS clear bilaterally no wheeze  Abdomen: obese soft +BS no guarding or rebounding  Musculoskeletal: joints without swelling. Left great toe with dressing dry and intact.   Neuro: oriented x3 speech clear bilateral grip 5/5. LE strength with 3/5 on left and 2/5 on right. Sensation intact   Data Reviewed: Basic Metabolic Panel: Recent Labs  Lab 11/30/18 1232 12/01/18 0633  NA 141 145  K 3.3* 3.4*  CL 107 112*  CO2 22 25  GLUCOSE 145* 100*  BUN 26* 25*  CREATININE 0.87 0.72  CALCIUM 9.9 8.8*   Liver Function Tests: Recent Labs  Lab 11/30/18 1232 12/01/18 0633  AST 154* 117*  ALT 49* 50*  ALKPHOS 75 56  BILITOT 2.0* 1.2  PROT 6.2* 4.7*  ALBUMIN 3.6 2.8*   No results for input(s): LIPASE, AMYLASE in the last 168 hours. No results for input(s): AMMONIA in the last 168 hours. CBC: Recent Labs  Lab 11/30/18 1232 12/01/18 0633  WBC 12.7* 9.7  NEUTROABS 10.5*  --    HGB 13.6 11.3*  HCT 41.9 32.9*  MCV 92.9 90.6  PLT 174 147*   Cardiac Enzymes: Recent Labs  Lab 11/30/18 1232 12/01/18 0633  CKTOTAL 6,972* 1,634*   BNP (last 3 results) No results for input(s): BNP in the last 8760 hours.  ProBNP (last 3 results) No results for input(s): PROBNP in the last 8760 hours.  CBG: Recent Labs  Lab 11/30/18 2132 12/01/18 0647 12/01/18 1130  GLUCAP 114* 110* 149*    Recent Results (from the past 240 hour(s))  SARS CORONAVIRUS 2 (TAT 6-24 HRS) Nasopharyngeal Nasopharyngeal Swab     Status: None   Collection Time: 11/30/18  4:05 PM   Specimen: Nasopharyngeal Swab  Result Value Ref Range Status   SARS Coronavirus 2 NEGATIVE NEGATIVE Final    Comment: (NOTE) SARS-CoV-2 target nucleic acids are NOT DETECTED. The SARS-CoV-2 RNA is generally detectable in upper and lower respiratory specimens during the acute phase of infection. Negative results do not preclude SARS-CoV-2 infection, do not rule out co-infections with other pathogens, and should not be used as the sole basis for treatment or other patient management decisions. Negative results must be combined with clinical observations, patient history, and epidemiological information. The expected result is Negative. Fact Sheet for Patients: SugarRoll.be Fact Sheet for Healthcare Providers: https://www.woods-mathews.com/ This test is not yet approved or cleared by the [place] FDA and  has been authorized for detection and/or diagnosis of SARS-CoV-2 by FDA under an Emergency Use Authorization (EUA). This EUA will remain  in effect (meaning this test can be used) for the duration of the COVID-19 declaration under Section 56 4(b)(1) of the Act, 21 U.S.C. section 360bbb-3(b)(1), unless the authorization is terminated or revoked sooner. Performed at [facility], [place] [address] [place], [place] [address]      Studies: Dg Chest 2  View  Result Date: 11/30/2018 CLINICAL DATA:  Unwitnessed fall. EXAM: CHEST - 2 VIEW COMPARISON:  09/11/2012 chest radiograph FINDINGS: This is a low volume film with mild bibasilar atelectasis. There is no evidence of focal airspace disease, pulmonary edema, suspicious pulmonary nodule/mass, pleural effusion, or pneumothorax. No acute bony abnormalities are identified. IMPRESSION: Low volume film with mild bibasilar atelectasis. Electronically Signed   By: Margarette Canada M.D.   On: 11/30/2018 14:17   Dg Lumbar Spine Complete  Result Date: 11/30/2018 CLINICAL DATA:  Acute low back pain following fall. Initial encounter. EXAM: LUMBAR SPINE - COMPLETE 4+ VIEW COMPARISON:  None. FINDINGS: No acute fracture or subluxation. Moderate multilevel degenerative disc disease and facet arthropathy again noted. No focal bony lesions are identified. Aortic atherosclerotic calcifications are noted. IMPRESSION: 1. No evidence of acute bony abnormality. 2. Moderate multilevel degenerative disc disease and facet arthropathy. 3.  Aortic Atherosclerosis (ICD10-I70.0). Electronically Signed   By: Margarette Canada M.D.   On: 11/30/2018 14:10   Dg Pelvis 1-2 Views  Result Date: 11/30/2018 CLINICAL DATA:  Acute pelvic pain following fall. Initial encounter. EXAM: PELVIS - 1-2 VIEW COMPARISON:  None. FINDINGS: There is no evidence of pelvic fracture or diastasis. No pelvic bone lesions are seen. IMPRESSION: Negative. Electronically Signed   By: Margarette Canada M.D.   On: 11/30/2018 14:07   Dg Shoulder Right  Result Date: 11/30/2018 CLINICAL DATA:  Acute RIGHT shoulder pain following fall. Initial encounter. EXAM: RIGHT SHOULDER - 2+ VIEW COMPARISON:  None. FINDINGS: No acute fracture, subluxation or dislocation. Degenerative changes at the glenohumeral and AC joints noted. No focal bony lesions are present. IMPRESSION: No evidence of acute bony abnormality. Electronically Signed   By: Margarette Canada M.D.   On: 11/30/2018 14:03   Ct  Head Wo Contrast  Result Date: 11/30/2018 CLINICAL DATA:  Fall, headache EXAM: CT HEAD WITHOUT CONTRAST CT CERVICAL SPINE WITHOUT CONTRAST TECHNIQUE: Multidetector CT imaging of the head and cervical spine was performed following the standard protocol without intravenous contrast. Multiplanar CT image reconstructions of the cervical spine were also generated. COMPARISON:  CT head dated 07/13/2016 FINDINGS: CT HEAD FINDINGS Brain: No evidence of acute infarction, hemorrhage, hydrocephalus, extra-axial collection or mass lesion/mass effect. Global cortical and central atrophy. Secondary ventricular prominence. Subcortical white matter and periventricular small vessel ischemic changes. Vascular: Intracranial atherosclerosis. Left vertebral artery aneurysm clips. Skull: Prior suboccipital craniectomy. Negative for fracture focal lesion. Sinuses/Orbits: Partial opacification of the right maxillary sinus. Visualized paranasal sinuses mastoid air cells are otherwise clear. Other: None. CT CERVICAL SPINE FINDINGS Alignment: Normal cervical lordosis. Skull base and vertebrae: No acute fracture. No primary bone lesion or focal pathologic process. Soft tissues and spinal canal: No prevertebral fluid or swelling. No visible canal hematoma. Disc levels: Moderate multilevel degenerative changes. Spinal canal is patent. Upper chest: Visualized lung apices are notable for right apical pleural-parenchymal scarring. Other: Visualized thyroid is notable for a 13 mm right thyroid nodule. IMPRESSION: No evidence of acute intracranial abnormality. Atrophy with small vessel ischemic changes. Prior suboccipital craniectomy and postprocedural changes related to prior left vertebral artery aneurysm clipping. No evidence medic injury to cervical spine. Moderate multilevel degenerative changes. Electronically Signed   By: Julian Hy M.D.   On: 11/30/2018 14:34   Ct Cervical Spine Wo Contrast  Result Date: 11/30/2018 CLINICAL  DATA:  Fall, headache EXAM: CT HEAD WITHOUT CONTRAST CT CERVICAL SPINE WITHOUT CONTRAST TECHNIQUE: Multidetector CT imaging of the head and cervical spine was performed following the standard protocol without intravenous contrast. Multiplanar CT image reconstructions of the cervical spine were also generated. COMPARISON:  CT head dated 07/13/2016 FINDINGS: CT HEAD FINDINGS Brain: No evidence of acute infarction, hemorrhage, hydrocephalus, extra-axial collection or mass lesion/mass effect. Global cortical and central atrophy. Secondary ventricular prominence. Subcortical white matter and periventricular small vessel ischemic changes. Vascular: Intracranial atherosclerosis. Left vertebral artery aneurysm clips. Skull: Prior suboccipital craniectomy. Negative for fracture focal lesion. Sinuses/Orbits: Partial opacification of the right maxillary sinus. Visualized paranasal sinuses mastoid air cells are otherwise clear. Other: None. CT CERVICAL SPINE FINDINGS Alignment: Normal cervical lordosis. Skull base and vertebrae: No acute fracture. No primary bone lesion or focal pathologic process. Soft tissues and spinal canal: No prevertebral fluid or swelling. No visible canal hematoma. Disc levels: Moderate multilevel degenerative changes. Spinal canal is patent. Upper chest: Visualized lung apices are notable for right apical pleural-parenchymal scarring. Other: Visualized thyroid is notable for a 13 mm right thyroid nodule. IMPRESSION: No evidence of acute intracranial abnormality. Atrophy with small vessel ischemic changes. Prior suboccipital craniectomy and postprocedural changes related to prior left vertebral artery aneurysm clipping. No evidence medic injury to cervical spine.  Moderate multilevel degenerative changes. Electronically Signed   By: Julian Hy M.D.   On: 11/30/2018 14:34   Dg Shoulder Left  Result Date: 11/30/2018 CLINICAL DATA:  Acute LEFT shoulder pain following fall. Initial encounter.  EXAM: LEFT SHOULDER - 2+ VIEW COMPARISON:  None. FINDINGS: No acute fracture, subluxation or dislocation. Degenerative changes at the glenohumeral and AC joints noted. No focal bony lesions are present. IMPRESSION: No acute abnormality. Electronically Signed   By: Margarette Canada M.D.   On: 11/30/2018 14:16   Dg Knee Complete 4 Views Left  Result Date: 11/30/2018 CLINICAL DATA:  Acute LEFT knee pain following fall. Initial encounter. EXAM: LEFT KNEE - COMPLETE 4+ VIEW COMPARISON:  12/19/2017 radiographs FINDINGS: MEDIAL compartment hemiarthroplasty again noted. No acute fracture or dislocation noted. No definite knee effusion noted. Moderate to severe degenerative changes in the LATERAL and patellofemoral compartment again identified. IMPRESSION: 1. No evidence of acute abnormality. Electronically Signed   By: Margarette Canada M.D.   On: 11/30/2018 14:06   Dg Knee Complete 4 Views Right  Result Date: 11/30/2018 CLINICAL DATA:  Acute RIGHT knee pain following fall. Initial encounter. EXAM: RIGHT KNEE - COMPLETE 4+ VIEW COMPARISON:  None. FINDINGS: RIGHT total knee arthroplasty noted. No evidence acute fracture or dislocation. No joint effusion noted. No bony lesions are present. IMPRESSION: 1. No evidence of acute abnormality. Electronically Signed   By: Margarette Canada M.D.   On: 11/30/2018 14:15   Dg Foot Complete Left  Result Date: 11/30/2018 CLINICAL DATA:  Acute LEFT foot pain following fall. Initial encounter. EXAM: LEFT FOOT - COMPLETE 3+ VIEW COMPARISON:  None. FINDINGS: No acute fracture, subluxation or dislocation. Mild degenerative changes at the 1st MTP joint noted. No focal bony lesions are present. IMPRESSION: No evidence of acute bony abnormality. Electronically Signed   By: Margarette Canada M.D.   On: 11/30/2018 14:14   Dg Foot Complete Right  Result Date: 11/30/2018 CLINICAL DATA:  Acute RIGHT foot pain following fall. Initial encounter. EXAM: RIGHT FOOT COMPLETE - 3+ VIEW COMPARISON:  None.  FINDINGS: No acute fracture, subluxation or dislocation. Mild hallux valgus and degenerative changes of 1st MTP joint noted. A small calcaneal spur is present. No soft tissue swelling noted. IMPRESSION: 1. No evidence of acute abnormality. Electronically Signed   By: Margarette Canada M.D.   On: 11/30/2018 14:12    Scheduled Meds: . enoxaparin (LOVENOX) injection  40 mg Subcutaneous Q24H  . insulin aspart  0-9 Units Subcutaneous TID WC  . mupirocin cream   Topical Daily  . potassium chloride  40 mEq Oral Q4H  . sodium chloride flush  3 mL Intravenous Q12H   Continuous Infusions: . sodium chloride 125 mL/hr at 12/01/18 0903  . cefTRIAXone (ROCEPHIN)  IV 1 g (11/30/18 2206)  . magnesium sulfate bolus IVPB      Principal Problem:   Traumatic rhabdomyolysis (HCC) Active Problems:   Hypokalemia   Diabetes mellitus without complication (HCC)   Transaminitis   Abnormal urinalysis   Leukocytosis   Open toe wound    Time spent: 45 minutes    [place] NP  [facility]  If 7PM-7AM, please contact night-coverage at [email or web address], password [facility] 12/01/2018, 12:14 PM  LOS: 0 days

## 2018-12-01 NOTE — TOC Initial Note (Addendum)
Transition of Care [facility]) - Initial/Assessment Note    Patient Details  Name: Kristina Wilkinson MRN: [id number] Date of Birth: 1927/06/20  Transition of Care [facility]) CM/SW Contact:    Bartholomew Crews, RN Phone Number: [phone] 12/01/2018, 3:12 PM  Clinical Narrative:                 Spoke with patient at the bedside after receiving message from PT requesting Home First program for patient. Patient is very [place].  PTA home alone. Has son, [name], in [place], [place] who usually calls to check on her 2-3 times per week. Noted not contact information for son in chart, but patient agreed to get his information or have son call nurse's station. Patient states that he is her medical POA. States that she she has friends who are local who assist as needed, like running errangs to the grocery store. Reports that she takes no medications. Discussed PT recommendations - patient does not want to go to rehab. She wants to be able to walk better. She states that she has a walker at home, but noted PT recommendations for a RW - will request delivery from [place]. Patient will need DME order for RW.  Patient is agreeable for [place] program if approved -  Patient will need HH orders for PT, OT, and RN with Face to Face. Referral placed to [facility] for community case management. For transportation home patient states that her friend, Adonis Huguenin, will come get her. Following for transition of care needs.   Update: Received confirmation from [facility] that patient was accepted into [facility] First program.  Expected Discharge Plan: [place] Barriers to Discharge: Continued Medical Work up   Patient Goals and CMS Choice Patient states their goals for this hospitalization and ongoing recovery are:: I want to be able to walk better CMS Medicare.gov Compare Post Acute Care list provided to:: Patient Choice offered to / list presented to : Patient  Expected Discharge Plan and Services Expected  Discharge Plan: [place] In-house Referral: Clinical Social Work, [facility] Discharge Planning Services: CM Consult Post Acute Care Choice: Home Health, Durable Medical Equipment Living arrangements for the past 2 months: Single Family Home                 DME Arranged: Walker rolling   Date DME Agency Contacted: 12/01/18 Time DME Agency Contacted: 81 Representative spoke with at DME Agency: Thedore Mins HH Arranged: RN, PT, OT, Nurse's Aide, Social Work [employer] Agency: [place] Date [place]: 12/01/18 Time [place]: 53 Representative spoke with at [place]: [place] Arrangements/Services Living arrangements for the past 2 months: [place] Lives with:: Self Patient language and need for interpreter reviewed:: Yes Do you feel safe going back to the place where you live?: Yes      Need for Family Participation in Patient Care: Yes (Comment) Care giver support system in place?: Yes (comment) Current home services: DME Criminal Activity/Legal Involvement Pertinent to Current Situation/Hospitalization: No - Comment as needed  Activities of Daily Living      Permission Sought/Granted Permission sought to share information with : Family Supports Permission granted to share information with : Yes, Verbal Permission Granted  Share Information with NAME: [name]     Permission granted to share info w Relationship: son  Permission granted to share info w Contact Information: patient to get contact information  Emotional Assessment  Appearance:: Appears stated age Attitude/Demeanor/Rapport: Engaged Affect (typically observed): Accepting Orientation: : Oriented to Situation, Oriented to  Time, Oriented to Place, Oriented to Self Alcohol / Substance Use: Not Applicable Psych Involvement: No (comment)  Admission diagnosis:  Multiple contusions [T07.XXXA] Fall, initial encounter [id number]] Traumatic rhabdomyolysis,  initial encounter ([place]) [T79.6XXA] Patient Active Problem List   Diagnosis Date Noted  . Open toe wound 12/01/2018  . Traumatic rhabdomyolysis ([place]) 11/30/2018  . Cellulitis 11/30/2018  . Transaminitis 11/30/2018  . Abnormal urinalysis 11/30/2018  . Leukocytosis 11/30/2018  . Hypokalemia 11/30/2018  . Scalp laceration   . Skull fracture ([place]) 07/13/2016  . Chronic left shoulder pain 04/03/2016  . Diabetes mellitus without complication ([place]) 0000000  . Hearing loss 06/21/2015  . Advance care planning 12/17/2014  . Palpitations 07/06/2014  . Pain in joint, ankle and foot 04/20/2014  . DVT, lower extremity ([place]) 11/19/2013  . Edema 10/20/2013  . Headache(784.0) 10/20/2013  . Left leg swelling 10/20/2013  . Aneurysm of basilar artery (HCC) 10/20/2013  . Bradycardia 10/20/2013  . Vitamin B12 deficiency 07/16/2013  . Paresthesia of both feet 05/17/2013  . TMJ arthritis 09/11/2012  . Ear pain 09/11/2012  . History of transfusion of packed red blood cells 09/11/2012  . Spinal stenosis 04/13/2011  . Well adult exam 01/23/2011  . Cholecystitis with cholelithiasis, s/p lap chole 11/10/2010 12/01/2010  . Shoulder pain, right 07/25/2010  . TOBACCO USE, QUIT 03/09/2009  . KNEE PAIN 05/18/2008  . PN (peripheral neuropathy) 08/19/2007  . LEG PAIN 08/19/2007  . Vitamin D deficiency 02/17/2007  . INSOMNIA, PERSISTENT 02/17/2007  . Essential hypertension 02/17/2007  . GERD 02/17/2007  . OSTEOARTHRITIS 02/17/2007  . LOW BACK PAIN 02/17/2007  . Hyperglycemia 01/28/2007  . UNSPECIFIED OSTEOPOROSIS 01/28/2007  . PRECIPITOUS DROP IN HEMATOCRIT 01/28/2007   PCP:  Elby Beck, FNP Pharmacy:   [facility], [place] [address] Phone: [phone] Fax: [phone]     Social Determinants of Health (SDOH) Interventions    Readmission Risk Interventions No flowsheet data found.

## 2018-12-01 NOTE — Progress Notes (Signed)
Patient's BP 182/73. Refusing PRN hydralazine. Per patient, "it will go down on its own". Patient educated x2, but still refusing. RN will continue to monitor. On call provider Baltazar Najjar, NP notified.

## 2018-12-01 NOTE — Consult Note (Signed)
   [place] Inpatient Consult   12/01/2018  AZALYN MOLE 1927/07/15 [id number]  Patient screened for potential [place] Management programs for patient in the Medicare [facility].Marland Kitchen  Review of patient's medical record reveals patient is eligible.  Primary Care Provider is Clarene Reamer, FNP with [facility] and this office is listed to provide the transition of care follow up.   Plan: Follow up with inpatient TOC team to confirm eligiblity. Continue to follow progress and disposition to assess for post hospital care management needs.    Please place a [facility] Care Management consult as appropriate and for questions contact:   Natividad Brood, RN BSN [facility] Liaison  [phone] business mobile phone Toll free office [phone]  Fax number: [phone] [email or web address]

## 2018-12-01 NOTE — Progress Notes (Signed)
Patient with elevated B/P of 212/72. Patient with PRN order to administer hydralazine but patient refuses and states she wants to let it go down on its own. I will recheck in 1 hour. Dorthey Sawyer, RN

## 2018-12-01 NOTE — Consult Note (Addendum)
[facility] Nurse wound consult note Reason for Consult: Consult requested for left great toe and 2nd toe.  Pt states she "dropped a can on her toe" last week and developed a full thickness wound, then she fell and obtained abrasions to the same foot recently. X-ray did not show a fracture; pt has "mild degenerative changes at the 1st MTP joint." Wound type: Anterior foot with partial thickness abrasion; .5X.5X.1cm, pink and moist Anterior great toe with full thickness wound; .8X1.5X.2cm, 50% red, 50% yellow and moist.  No odor, small amt yellow drainage.  Anterior 2nd toe with full thickness wound; 1X.3X.3cm, 50% red, 50% yellow and moist.  No odor, small amt yellow drainage Dressing procedure/placement/frequency: Topical treatment orders provided for bedside nurses to perform daily as follows: Apply Bactroban to left 1st and 2nd toes and anterior foot Q day, then recover with foam dressing.  (Change foam dressing Q 3 days or PRN soiling.) Please re-consult if further assistance is needed.  Thank-you,  Julien Girt MSN, [place], [place], [place], [place]

## 2018-12-01 NOTE — Evaluation (Signed)
Physical Therapy Evaluation Patient Details Name: Kristina Wilkinson MRN: [id number] DOB: 09-12-1927 Today's Date: 12/01/2018   History of Present Illness  Pt is an 85 y/o female who presents s/p mechanical fall at home (tripped over a rug in the bathroom), and being down over 12+ hours. She presents to the ED after son found her at home with rhabdomyolitis. PMH significant for HTN, intracranial aneurysm s/p clipping, DM type II, chronic low back pain, peripheral neuropathy, and arthritis.  Clinical Impression  Pt admitted with above diagnosis. At the time of PT eval pt was able to perform transfers and minimal ambulation in room with up to mod assist and +2 for safety. Pt with a fear of falling, and because of this unsafely reaching out for external support at times and letting go of the walker. Discussed the increased level of assist that pt currently requires to complete basic transfers, and PT's recommendation for continued skilled therapy at d/c. Pt resistant to SNF level care as she wants to return home. If pt qualifies, recommend [facility] program to keep pt at home and help her return to PLOF. Pt currently with functional limitations due to the deficits listed below (see PT Problem List). Pt will benefit from skilled PT to increase their independence and safety with mobility to allow discharge to the venue listed below.       Follow Up Recommendations SNF;Supervision/Assistance - 24 hour    Equipment Recommendations  Rolling walker with 5" wheels(Pt states her walker doesn't fold up -not sure if she has RW)    Recommendations for Other Services       Precautions / Restrictions Precautions Precautions: Fall Restrictions Weight Bearing Restrictions: No      Mobility  Bed Mobility Overal bed mobility: Needs Assistance Bed Mobility: Supine to Sit     Supine to sit: Mod assist     General bed mobility comments: Assist to transition to EOB. Pt was able to initiate LE  movement off EOB however reaching out for therapist's hand and required trunk elevation to full sitting position.   Transfers Overall transfer level: Needs assistance Equipment used: Rolling walker (2 wheeled) Transfers: Sit to/from [facility] Sit to Stand: Mod assist Stand pivot transfers: Mod assist       General transfer comment: Pt with poor use of RW and required frequent cues for safe hand placement. Heavy +1 mod assist for power-up to full stand. Pt initiating stand>sit prematurely and required assist and cues for safe landing on BSC and then to recliner after.   Ambulation/Gait Ambulation/Gait assistance: Min assist;+2 safety/equipment Gait Distance (Feet): 5 Feet Assistive device: Rolling walker (2 wheeled) Gait Pattern/deviations: Shuffle;Decreased stride length;Trunk flexed Gait velocity: Decreased Gait velocity interpretation: <1.31 ft/sec, indicative of household ambulator General Gait Details: Pt with shuffling gait pattern and required min assist to ambulate from [facility] to recliner chair.   Stairs            Wheelchair Mobility    Modified Rankin (Stroke Patients Only)       Balance Overall balance assessment: Needs assistance Sitting-balance support: Feet supported;No upper extremity supported Sitting balance-Leahy Scale: Fair     Standing balance support: No upper extremity supported Standing balance-Leahy Scale: Poor Standing balance comment: Heavily reliant on UE support                              Pertinent Vitals/Pain Pain Assessment: Faces Faces Pain Scale:  Hurts little more Pain Location: Generalized pain with functional mobility.  Pain Descriptors / Indicators: Sore Pain Intervention(s): Limited activity within patient's tolerance;Monitored during session;Repositioned    Home Living Family/patient expects to be discharged to:: Private residence Living Arrangements: Alone Available Help at Discharge:  Family;Available PRN/intermittently Type of Home: House Home Access: Stairs to enter Entrance Stairs-Rails: Right;Left;Can reach both Entrance Stairs-Number of Steps: 3-4 Home Layout: One level Home Equipment: Grab bars - tub/shower;Shower seat - built in;Cane - single point;Walker - 2 wheels      Prior Function Level of Independence: Needs assistance   Gait / Transfers Assistance Needed: Uses cane and walker in the house.   ADL's / Homemaking Assistance Needed: Drives, someone goes with her to do shopping- holds to the shopping cart for support. Threasa Beards comes in to clean for her. Pt does the light housework and the aide does the heavy cleaning.         Hand Dominance        Extremity/Trunk Assessment   Upper Extremity Assessment Upper Extremity Assessment: Defer to OT evaluation    Lower Extremity Assessment Lower Extremity Assessment: Generalized weakness;RLE deficits/detail RLE Deficits / Details: Pt reports severe bilateral knee pain that is not new to this episode.     Cervical / Trunk Assessment Cervical / Trunk Assessment: Kyphotic;Other exceptions Cervical / Trunk Exceptions: Forward head posture with rounded shoulders  Communication   Communication: HOH  Cognition Arousal/Alertness: Awake/alert Behavior During Therapy: WFL for tasks assessed/performed Overall Cognitive Status: Within Functional Limits for tasks assessed                                        General Comments      Exercises     Assessment/Plan    PT Assessment Patient needs continued PT services  PT Problem List Decreased strength;Decreased activity tolerance;Decreased balance;Decreased mobility;Decreased knowledge of use of DME;Decreased safety awareness;Decreased knowledge of precautions;Pain       PT Treatment Interventions DME instruction;Gait training;Functional mobility training;Therapeutic activities;Therapeutic exercise;Neuromuscular re-education;Patient/family  education    PT Goals (Current goals can be found in the Care Plan section)  Acute Rehab PT Goals Patient Stated Goal: "Do therapy at home" PT Goal Formulation: With patient Time For Goal Achievement: 12/08/18 Potential to Achieve Goals: Good    Frequency Min 3X/week   Barriers to discharge Decreased caregiver support Pt lives alone, states she does not have 24 hour support (but has not asked her family yet).     Co-evaluation               AM-PAC PT "6 Clicks" Mobility  Outcome Measure Help needed turning from your back to your side while in a flat bed without using bedrails?: A Little Help needed moving from lying on your back to sitting on the side of a flat bed without using bedrails?: A Little Help needed moving to and from a bed to a chair (including a wheelchair)?: A Little Help needed standing up from a chair using your arms (e.g., wheelchair or bedside chair)?: A Little Help needed to walk in hospital room?: A Little Help needed climbing 3-5 steps with a railing? : Total 6 Click Score: 16    End of Session Equipment Utilized During Treatment: Gait belt Activity Tolerance: Patient limited by fatigue(weakness) Patient left: in chair;with call bell/phone within reach;with chair alarm set Nurse Communication: Mobility status PT Visit Diagnosis: Unsteadiness  on feet (R26.81);Pain;Muscle weakness (generalized) (M62.81);History of falling (Z91.81) Pain - part of body: (Shoulders, back, arms, legs)    Time: 1350-1433 PT Time Calculation (min) (ACUTE ONLY): 43 min   Charges:   PT Evaluation $PT Eval Moderate Complexity: 1 Mod PT Treatments $Gait Training: 23-37 mins        Rolinda Roan, PT, DPT Acute Rehabilitation Services Pager: [phone] Office: [phone]   Thelma Comp 12/01/2018, 2:57 PM

## 2018-12-02 DIAGNOSIS — S91109D Unspecified open wound of unspecified toe(s) without damage to nail, subsequent encounter: Secondary | ICD-10-CM

## 2018-12-02 LAB — COMPREHENSIVE METABOLIC PANEL
ALT: 54 U/L — ABNORMAL HIGH (ref 0–44)
AST: 92 U/L — ABNORMAL HIGH (ref 15–41)
Albumin: 3 g/dL — ABNORMAL LOW (ref 3.5–5.0)
Alkaline Phosphatase: 54 U/L (ref 38–126)
Anion gap: 8 (ref 5–15)
BUN: 19 mg/dL (ref 8–23)
CO2: 22 mmol/L (ref 22–32)
Calcium: 9 mg/dL (ref 8.9–10.3)
Chloride: 113 mmol/L — ABNORMAL HIGH (ref 98–111)
Creatinine, Ser: 0.71 mg/dL (ref 0.44–1.00)
GFR calc Af Amer: >60 mL/min (ref >60)
GFR calc non Af Amer: >60 mL/min (ref >60)
Glucose, Bld: 120 mg/dL — ABNORMAL HIGH (ref 70–99)
Potassium: 4.3 mmol/L (ref 3.5–5.1)
Sodium: 143 mmol/L (ref 135–145)
Total Bilirubin: 0.6 mg/dL (ref 0.3–1.2)
Total Protein: 5.2 g/dL — ABNORMAL LOW (ref 6.5–8.1)

## 2018-12-02 LAB — CBC
HCT: 34.8 % — ABNORMAL LOW (ref 36.0–46.0)
Hemoglobin: 11.6 g/dL — ABNORMAL LOW (ref 12.0–15.0)
MCH: 30.9 pg (ref 26.0–34.0)
MCHC: 33.3 g/dL (ref 30.0–36.0)
MCV: 92.6 fL (ref 80.0–100.0)
Platelets: 143 10*3/uL — ABNORMAL LOW (ref 150–400)
RBC: 3.76 MIL/uL — ABNORMAL LOW (ref 3.87–5.11)
RDW: 13 % (ref 11.5–15.5)
WBC: 8.3 10*3/uL (ref 4.0–10.5)
nRBC: 0 % (ref 0.0–0.2)

## 2018-12-02 LAB — CK: Total CK: 654 U/L — ABNORMAL HIGH (ref 38–234)

## 2018-12-02 LAB — GLUCOSE, CAPILLARY
Glucose-Capillary: 110 mg/dL — ABNORMAL HIGH (ref 70–99)
Glucose-Capillary: 149 mg/dL — ABNORMAL HIGH (ref 70–99)
Glucose-Capillary: 104 mg/dL — ABNORMAL HIGH (ref 70–99)
Glucose-Capillary: 126 mg/dL — ABNORMAL HIGH (ref 70–99)

## 2018-12-02 MED ORDER — CIPROFLOXACIN HCL 500 MG PO TABS: 500.0000 mg | ORAL_TABLET | Freq: Two times a day (BID) | ORAL | 0 refills | Status: DC

## 2018-12-02 NOTE — Progress Notes (Signed)
Physical Therapy Treatment Patient Details Name: Kristina Wilkinson MRN: [id number] DOB: Sep 17, 1927 Today's Date: 12/02/2018    History of Present Illness Pt is an 85 y/o female who presents s/p mechanical fall at home (tripped over a rug in the bathroom), and being down over 12+ hours. She presents to the ED after son found her at home with rhabdomyolitis. PMH significant for HTN, intracranial aneurysm s/p clipping, DM type II, chronic low back pain, peripheral neuropathy, and arthritis.    PT Comments    Focus of session was to progress gait training and assess tolerance for functional activity. Pt had just finished working with OT when PT arrived, and reports fatigue from ambulation in room to use bathroom. Despite fatigue, pt was able to ambulate ~120 feet with RW and close hands on guarding for safety. Chair follow utilized for seated rest break if needed, however pt took 1 standing rest break instead. Pt continues to adamantly refuse SNF level services. We talked again about recommendation to have 24 hour support at least initially, as [facility] program is not able to provide 24 hour care. Pt is at increased risk for falls, with decreased tolerance for functional activity, and need for assist during basic transfers. Pt reports "I'll be fine, I know what I need to do". Will continue to follow and progress as able per POC.     Follow Up Recommendations  SNF;Supervision/Assistance - 24 hour     Equipment Recommendations  Rolling walker with 5" wheels(Pt states her walker doesn't fold up -not sure if she has RW)    Recommendations for Other Services       Precautions / Restrictions Precautions Precautions: Fall Restrictions Weight Bearing Restrictions: No    Mobility  Bed Mobility Overal bed mobility: Needs Assistance Bed Mobility: Supine to Sit     Supine to sit: Supervision     General bed mobility comments: Pt received sitting up in recliner chair upon PT arrival.    Transfers Overall transfer level: Needs assistance Equipment used: Rolling walker (2 wheeled) Transfers: Sit to/from Stand Sit to Stand: Min assist;+2 physical assistance;+2 safety/equipment         General transfer comment: VC's for hand placement on seated surface for safety. Assist to power-up to full standing with increased time to transition hands from chair to walker.   Ambulation/Gait Ambulation/Gait assistance: Min guard;+2 safety/equipment Gait Distance (Feet): 120 Feet Assistive device: Rolling walker (2 wheeled) Gait Pattern/deviations: Shuffle;Decreased stride length;Trunk flexed Gait velocity: Decreased Gait velocity interpretation: <1.8 ft/sec, indicate of risk for recurrent falls General Gait Details: Close hands on guarding throughout gait training. Pt with 1 standing rest break and attempting to reach out for railings in the hall instead of keeping hands on RW for support. VC's for improved posture and closer walker proximity throughout gait training.    Stairs             Wheelchair Mobility    Modified Rankin (Stroke Patients Only)       Balance Overall balance assessment: History of Falls Sitting-balance support: Feet supported;No upper extremity supported Sitting balance-Leahy Scale: Fair     Standing balance support: No upper extremity supported Standing balance-Leahy Scale: Poor Standing balance comment: Heavily reliant on UE support                             Cognition Arousal/Alertness: Awake/alert Behavior During Therapy: WFL for tasks assessed/performed Overall Cognitive Status: Within Functional Limits  for tasks assessed                                 General Comments: most likely baseline level of funtion. Decreased insight/judgement; fear of SNF; will further assess      Exercises      General Comments        Pertinent Vitals/Pain Pain Assessment: Faces Faces Pain Scale: Hurts little  more Pain Location: back and BLE Pain Descriptors / Indicators: Sore Pain Intervention(s): Limited activity within patient's tolerance;Monitored during session;Repositioned    Home Living Family/patient expects to be discharged to:: Private residence Living Arrangements: Alone Available Help at Discharge: Family;Available PRN/intermittently Type of Home: House Home Access: Stairs to enter Entrance Stairs-Rails: Right;Left;Can reach both Home Layout: One level Home Equipment: Grab bars - tub/shower;Shower seat - built in;Cane - single point;Walker - 2 wheels      Prior Function Level of Independence: Independent with assistive device(s)  Gait / Transfers Assistance Needed: Uses cane and walker in the house .  ADL's / Homemaking Assistance Needed: Drives, someone goes with her to do shopping- holds to the shopping cart for support. Threasa Beards comes in to clean for her. Pt does the light housework and the aide does the heavy cleaning.      PT Goals (current goals can now be found in the care plan section) Acute Rehab PT Goals Patient Stated Goal: "Get rehab at home" PT Goal Formulation: With patient Time For Goal Achievement: 12/08/18 Potential to Achieve Goals: Good Progress towards PT goals: Progressing toward goals    Frequency    Min 3X/week      PT Plan Current plan remains appropriate    Co-evaluation              AM-PAC PT "6 Clicks" Mobility   Outcome Measure  Help needed turning from your back to your side while in a flat bed without using bedrails?: A Little Help needed moving from lying on your back to sitting on the side of a flat bed without using bedrails?: A Little Help needed moving to and from a bed to a chair (including a wheelchair)?: A Little Help needed standing up from a chair using your arms (e.g., wheelchair or bedside chair)?: A Little Help needed to walk in hospital room?: A Little Help needed climbing 3-5 steps with a railing? : Total 6 Click  Score: 16    End of Session Equipment Utilized During Treatment: Gait belt Activity Tolerance: Patient limited by fatigue(weakness) Patient left: in chair;with call bell/phone within reach;with chair alarm set Nurse Communication: Mobility status PT Visit Diagnosis: Unsteadiness on feet (R26.81);Pain;Muscle weakness (generalized) (M62.81);History of falling (Z91.81) Pain - part of body: (Shoulders, back, arms, legs)     Time: 1346-1410 PT Time Calculation (min) (ACUTE ONLY): 24 min  Charges:  $Gait Training: 23-37 mins                     Rolinda Roan, PT, DPT Acute Rehabilitation Services Pager: [phone] Office: [phone]    Thelma Comp 12/02/2018, 3:10 PM

## 2018-12-02 NOTE — Progress Notes (Signed)
PROGRESS NOTE    Kristina Wilkinson  XBJ:[id number] DOB: 10-14-1927 DOA: 11/30/2018 PCP: Emi Belfast, FNP   Brief Narrative:  Briefly Kristina Wilkinson is an 85 year old female with history of hypertension, history of intracranial aneurysm clipping in 1980s and 90s with mild chronic right lower extremity weakness, chronic low back pain, type 2 diabetes, peripheral neuropathy, arthritis reports tripping over a rug yesterday in front of her sink hit her head and fell down, was not able to get back up, was found by son the following day and brought to the emergency room, in the ED noted to have elevated CK, abnormal urinalysis with positive nitrites, no acute fractures noted on imaging studies   Assessment & Plan:   Principal Problem:   Traumatic rhabdomyolysis (HCC) Active Problems:   Diabetes mellitus without complication (HCC)   Transaminitis   Abnormal urinalysis   Leukocytosis   Hypokalemia   Open toe wound  Patient was planned for discharge today.  Unfortunately still 2 person assist and unable to get 24 hour care at home.  Patient has declined SNF.  AS RESULT DISCHARGE HAS BEEN CANCELED AND PATIENT REMAIN IN THE HOSPITAL TO DETERMINE SAFE DISCHARGE PLAN  Traumatic rhabdomyolysissecondary to fall: Acute. Patient presented after tripping over a rug at home. She was unable to get up for over 12 hours. Imaging studies did not show any acute fractures. Patient had been given 1 L of normal saline IV fluids and then placed on a sodium bicarb drip for 1 L. CK trending down to 600s from nearly 7000 -renal fx intact -Monitor intake and output -Hydrocodone as needed for severe pain -Daily monitoring of CK - PT/OT   Leukocytosis: Acute. WBC 12.7 on admission. Likely related to above. WBC within limits of normal this am. Patient is afebrile and non-toxic appearing.  Cellulitis/ left great toe wound: Acute. Patient with redness and erythema around skin abrasions related with fall. Also  with wounds to left great toe and second toe due to dropping a can on toe last week. Xray without fx. Evaluated by wound care who recommend topical treatment. Patient is afebrile and non-toxic appearing - continue Rocephin IV  Abnormal urinalysis:Urinalysis positive for hemoglobin and nitrite positive. -follow  urine culture -continue Rocephin  Prolonged QT interval: Acute. Initial QTc 503 on admission -follow repeat ekg IN am - change outpt cipro/abx if not improved  Hypokalemia: Acute. Initial potassium 3.3., now 4.3  Mag level 1.8 -Give 40 mEq of potassium chloride x1 dose now -IV mag given -Continue to monitor and replace as needed  Transaminitis: Acute.AST and ALT evaluate. Likely secondary to rhabdomyolysis. Trending down this am.  -declining.  Hypertensive urgency:resolved this am but BP remains high end of normal. Patient not currently on any medications. -Hydralazine IVprn -OP follow up -adding amlodpine 5mg   Diabetes mellitus type 2: Last hemoglobin A1c noted to be 6.4 on 04/2017. Patient not on any diabetic medications. -Hypoglycemic protocols -CBGs before every meal with sensitive SSI  History of aneurysms/pclipping   DVT prophylaxis: lovenox Code Status: Full    Code Status Orders  (From admission, onward)         Start     Ordered   11/30/18 1520  Full code  Continuous     11/30/18 1521        Code Status History    Date Active Date Inactive Code Status Order ID Comments User Context   07/13/2016 2205 07/14/2016 1650 Full Code [id number]  Bobette Mo, MD Inpatient  Advance Care Planning Activity     Family Communication: discussed with pt in detail Disposition Plan:    Patient will remain inpatient.  There is no safe discharge plan.  She is currently requiring 2 person assist with recommendations for 24 hour care at home.  Patient does not have this available at home and would be an unsafe discharge Consults called:  none Admission status: inpt   Consultants:   none  Procedures:  Dg Chest 2 View  Result Date: 11/30/2018 CLINICAL DATA:  Unwitnessed fall. EXAM: CHEST - 2 VIEW COMPARISON:  09/11/2012 chest radiograph FINDINGS: This is a low volume film with mild bibasilar atelectasis. There is no evidence of focal airspace disease, pulmonary edema, suspicious pulmonary nodule/mass, pleural effusion, or pneumothorax. No acute bony abnormalities are identified. IMPRESSION: Low volume film with mild bibasilar atelectasis. Electronically Signed   By: Harmon Pier M.D.   On: 11/30/2018 14:17   Dg Lumbar Spine Complete  Result Date: 11/30/2018 CLINICAL DATA:  Acute low back pain following fall. Initial encounter. EXAM: LUMBAR SPINE - COMPLETE 4+ VIEW COMPARISON:  None. FINDINGS: No acute fracture or subluxation. Moderate multilevel degenerative disc disease and facet arthropathy again noted. No focal bony lesions are identified. Aortic atherosclerotic calcifications are noted. IMPRESSION: 1. No evidence of acute bony abnormality. 2. Moderate multilevel degenerative disc disease and facet arthropathy. 3.  Aortic Atherosclerosis (ICD10-I70.0). Electronically Signed   By: Harmon Pier M.D.   On: 11/30/2018 14:10   Dg Pelvis 1-2 Views  Result Date: 11/30/2018 CLINICAL DATA:  Acute pelvic pain following fall. Initial encounter. EXAM: PELVIS - 1-2 VIEW COMPARISON:  None. FINDINGS: There is no evidence of pelvic fracture or diastasis. No pelvic bone lesions are seen. IMPRESSION: Negative. Electronically Signed   By: Harmon Pier M.D.   On: 11/30/2018 14:07   Dg Shoulder Right  Result Date: 11/30/2018 CLINICAL DATA:  Acute RIGHT shoulder pain following fall. Initial encounter. EXAM: RIGHT SHOULDER - 2+ VIEW COMPARISON:  None. FINDINGS: No acute fracture, subluxation or dislocation. Degenerative changes at the glenohumeral and AC joints noted. No focal bony lesions are present. IMPRESSION: No evidence of acute bony  abnormality. Electronically Signed   By: Harmon Pier M.D.   On: 11/30/2018 14:03   Ct Head Wo Contrast  Result Date: 11/30/2018 CLINICAL DATA:  Fall, headache EXAM: CT HEAD WITHOUT CONTRAST CT CERVICAL SPINE WITHOUT CONTRAST TECHNIQUE: Multidetector CT imaging of the head and cervical spine was performed following the standard protocol without intravenous contrast. Multiplanar CT image reconstructions of the cervical spine were also generated. COMPARISON:  CT head dated 07/13/2016 FINDINGS: CT HEAD FINDINGS Brain: No evidence of acute infarction, hemorrhage, hydrocephalus, extra-axial collection or mass lesion/mass effect. Global cortical and central atrophy. Secondary ventricular prominence. Subcortical white matter and periventricular small vessel ischemic changes. Vascular: Intracranial atherosclerosis. Left vertebral artery aneurysm clips. Skull: Prior suboccipital craniectomy. Negative for fracture focal lesion. Sinuses/Orbits: Partial opacification of the right maxillary sinus. Visualized paranasal sinuses mastoid air cells are otherwise clear. Other: None. CT CERVICAL SPINE FINDINGS Alignment: Normal cervical lordosis. Skull base and vertebrae: No acute fracture. No primary bone lesion or focal pathologic process. Soft tissues and spinal canal: No prevertebral fluid or swelling. No visible canal hematoma. Disc levels: Moderate multilevel degenerative changes. Spinal canal is patent. Upper chest: Visualized lung apices are notable for right apical pleural-parenchymal scarring. Other: Visualized thyroid is notable for a 13 mm right thyroid nodule. IMPRESSION: No evidence of acute intracranial abnormality. Atrophy with small vessel ischemic changes.  Prior suboccipital craniectomy and postprocedural changes related to prior left vertebral artery aneurysm clipping. No evidence medic injury to cervical spine. Moderate multilevel degenerative changes. Electronically Signed   By: Charline Bills M.D.   On:  11/30/2018 14:34   Ct Cervical Spine Wo Contrast  Result Date: 11/30/2018 CLINICAL DATA:  Fall, headache EXAM: CT HEAD WITHOUT CONTRAST CT CERVICAL SPINE WITHOUT CONTRAST TECHNIQUE: Multidetector CT imaging of the head and cervical spine was performed following the standard protocol without intravenous contrast. Multiplanar CT image reconstructions of the cervical spine were also generated. COMPARISON:  CT head dated 07/13/2016 FINDINGS: CT HEAD FINDINGS Brain: No evidence of acute infarction, hemorrhage, hydrocephalus, extra-axial collection or mass lesion/mass effect. Global cortical and central atrophy. Secondary ventricular prominence. Subcortical white matter and periventricular small vessel ischemic changes. Vascular: Intracranial atherosclerosis. Left vertebral artery aneurysm clips. Skull: Prior suboccipital craniectomy. Negative for fracture focal lesion. Sinuses/Orbits: Partial opacification of the right maxillary sinus. Visualized paranasal sinuses mastoid air cells are otherwise clear. Other: None. CT CERVICAL SPINE FINDINGS Alignment: Normal cervical lordosis. Skull base and vertebrae: No acute fracture. No primary bone lesion or focal pathologic process. Soft tissues and spinal canal: No prevertebral fluid or swelling. No visible canal hematoma. Disc levels: Moderate multilevel degenerative changes. Spinal canal is patent. Upper chest: Visualized lung apices are notable for right apical pleural-parenchymal scarring. Other: Visualized thyroid is notable for a 13 mm right thyroid nodule. IMPRESSION: No evidence of acute intracranial abnormality. Atrophy with small vessel ischemic changes. Prior suboccipital craniectomy and postprocedural changes related to prior left vertebral artery aneurysm clipping. No evidence medic injury to cervical spine. Moderate multilevel degenerative changes. Electronically Signed   By: Charline Bills M.D.   On: 11/30/2018 14:34   Dg Shoulder Left  Result Date:  11/30/2018 CLINICAL DATA:  Acute LEFT shoulder pain following fall. Initial encounter. EXAM: LEFT SHOULDER - 2+ VIEW COMPARISON:  None. FINDINGS: No acute fracture, subluxation or dislocation. Degenerative changes at the glenohumeral and AC joints noted. No focal bony lesions are present. IMPRESSION: No acute abnormality. Electronically Signed   By: Harmon Pier M.D.   On: 11/30/2018 14:16   Dg Knee Complete 4 Views Left  Result Date: 11/30/2018 CLINICAL DATA:  Acute LEFT knee pain following fall. Initial encounter. EXAM: LEFT KNEE - COMPLETE 4+ VIEW COMPARISON:  12/19/2017 radiographs FINDINGS: MEDIAL compartment hemiarthroplasty again noted. No acute fracture or dislocation noted. No definite knee effusion noted. Moderate to severe degenerative changes in the LATERAL and patellofemoral compartment again identified. IMPRESSION: 1. No evidence of acute abnormality. Electronically Signed   By: Harmon Pier M.D.   On: 11/30/2018 14:06   Dg Knee Complete 4 Views Right  Result Date: 11/30/2018 CLINICAL DATA:  Acute RIGHT knee pain following fall. Initial encounter. EXAM: RIGHT KNEE - COMPLETE 4+ VIEW COMPARISON:  None. FINDINGS: RIGHT total knee arthroplasty noted. No evidence acute fracture or dislocation. No joint effusion noted. No bony lesions are present. IMPRESSION: 1. No evidence of acute abnormality. Electronically Signed   By: Harmon Pier M.D.   On: 11/30/2018 14:15   Dg Foot Complete Left  Result Date: 11/30/2018 CLINICAL DATA:  Acute LEFT foot pain following fall. Initial encounter. EXAM: LEFT FOOT - COMPLETE 3+ VIEW COMPARISON:  None. FINDINGS: No acute fracture, subluxation or dislocation. Mild degenerative changes at the 1st MTP joint noted. No focal bony lesions are present. IMPRESSION: No evidence of acute bony abnormality. Electronically Signed   By: Harmon Pier M.D.   On: 11/30/2018 14:14  Dg Foot Complete Right  Result Date: 11/30/2018 CLINICAL DATA:  Acute RIGHT foot pain  following fall. Initial encounter. EXAM: RIGHT FOOT COMPLETE - 3+ VIEW COMPARISON:  None. FINDINGS: No acute fracture, subluxation or dislocation. Mild hallux valgus and degenerative changes of 1st MTP joint noted. A small calcaneal spur is present. No soft tissue swelling noted. IMPRESSION: 1. No evidence of acute abnormality. Electronically Signed   By: Harmon Pier M.D.   On: 11/30/2018 14:12     Antimicrobials:   Ceftriaxone >10/18   Subjective:  no acute events overnight  patient still significantly weak requiring 2 person assist  poor insight  Objective: Vitals:   12/01/18 2254 12/02/18 0516 12/02/18 0600 12/02/18 1035  BP: (!) 182/73 (!) 188/71 (!) 164/70 (!) 152/77  Pulse: 69 70 62 66  Resp:  18  19  Temp:  97.9 F (36.6 C)  98 F (36.7 C)  TempSrc:  Oral  Oral  SpO2:  96%  97%    Intake/Output Summary (Last 24 hours) at 12/02/2018 1323 Last data filed at 12/02/2018 0516 Gross per 24 hour  Intake 240 ml  Output 0 ml  Net 240 ml   There were no vitals filed for this visit.  Examination:  General exam: Appears calm and comfortable  Respiratory system: Clear to auscultation. Respiratory effort normal. Cardiovascular system: S1 & S2 heard, RRR. No JVD, murmurs, rubs, gallops or clicks. No pedal edema. Gastrointestinal system: Abdomen is nondistended, soft and nontender. No organomegaly or masses felt. Normal bowel sounds heard. Central nervous system: Alert and oriented. No focal neurological deficits. Extremities:  Generalized weakness but does move all 4 extremities. Skin: No rashes, lesions or ulcers Psychiatry: Judgement and insight is poor. Mood & affect appropriate.     Data Reviewed: I have personally reviewed following labs and imaging studies  CBC: Recent Labs  Lab 11/30/18 1232 12/01/18 0633 12/02/18 0607  WBC 12.7* 9.7 8.3  NEUTROABS 10.5*  --   --   HGB 13.6 11.3* 11.6*  HCT 41.9 32.9* 34.8*  MCV 92.9 90.6 92.6  PLT 174 147* 143*   Basic  Metabolic Panel: Recent Labs  Lab 11/30/18 1232 12/01/18 0633 12/02/18 0607  NA 141 145 143  K 3.3* 3.4* 4.3  CL 107 112* 113*  CO2 22 25 22   GLUCOSE 145* 100* 120*  BUN 26* 25* 19  CREATININE 0.87 0.72 0.71  CALCIUM 9.9 8.8* 9.0   GFR: CrCl cannot be calculated (Unknown ideal weight.). Liver Function Tests: Recent Labs  Lab 11/30/18 1232 12/01/18 0633 12/02/18 0607  AST 154* 117* 92*  ALT 49* 50* 54*  ALKPHOS 75 56 54  BILITOT 2.0* 1.2 0.6  PROT 6.2* 4.7* 5.2*  ALBUMIN 3.6 2.8* 3.0*   No results for input(s): LIPASE, AMYLASE in the last 168 hours. No results for input(s): AMMONIA in the last 168 hours. Coagulation Profile: No results for input(s): INR, PROTIME in the last 168 hours. Cardiac Enzymes: Recent Labs  Lab 11/30/18 1232 12/01/18 0633 12/02/18 0607  CKTOTAL 6,972* 1,634* 654*   BNP (last 3 results) No results for input(s): PROBNP in the last 8760 hours. HbA1C: Recent Labs    11/30/18 1232  HGBA1C 6.2*   CBG: Recent Labs  Lab 12/01/18 1130 12/01/18 1634 12/01/18 2019 12/02/18 0648 12/02/18 1236  GLUCAP 149* 125* 137* 110* 149*   Lipid Profile: No results for input(s): CHOL, HDL, LDLCALC, TRIG, CHOLHDL, LDLDIRECT in the last 72 hours. Thyroid Function Tests: No results for input(s): TSH,  T4TOTAL, FREET4, T3FREE, THYROIDAB in the last 72 hours. Anemia Panel: No results for input(s): VITAMINB12, FOLATE, FERRITIN, TIBC, IRON, RETICCTPCT in the last 72 hours. Sepsis Labs: No results for input(s): PROCALCITON, LATICACIDVEN in the last 168 hours.  Recent Results (from the past 240 hour(s))  SARS CORONAVIRUS 2 (TAT 6-24 HRS) Nasopharyngeal Nasopharyngeal Swab     Status: None   Collection Time: 11/30/18  4:05 PM   Specimen: Nasopharyngeal Swab  Result Value Ref Range Status   SARS Coronavirus 2 NEGATIVE NEGATIVE Final    Comment: (NOTE) SARS-CoV-2 target nucleic acids are NOT DETECTED. The SARS-CoV-2 RNA is generally detectable in upper  and lower respiratory specimens during the acute phase of infection. Negative results do not preclude SARS-CoV-2 infection, do not rule out co-infections with other pathogens, and should not be used as the sole basis for treatment or other patient management decisions. Negative results must be combined with clinical observations, patient history, and epidemiological information. The expected result is Negative. Fact Sheet for Patients: HairSlick.no Fact Sheet for Healthcare Providers: quierodirigir.com This test is not yet approved or cleared by the [place] FDA and  has been authorized for detection and/or diagnosis of SARS-CoV-2 by FDA under an Emergency Use Authorization (EUA). This EUA will remain  in effect (meaning this test can be used) for the duration of the COVID-19 declaration under Section 56 4(b)(1) of the Act, 21 U.S.C. section 360bbb-3(b)(1), unless the authorization is terminated or revoked sooner. Performed at [facility], [address] [place], [place] [address]   Urine Culture     Status: Abnormal (Preliminary result)   Collection Time: 11/30/18  8:29 PM   Specimen: Urine, Random  Result Value Ref Range Status   Specimen Description URINE, RANDOM  Final   Special Requests NONE  Final   Culture (A)  Final    >=100,000 COLONIES/mL PROTEUS MIRABILIS CULTURE REINCUBATED FOR BETTER GROWTH Performed at [facility], [address], [place], [place] [address]    Report Status PENDING  Incomplete   Organism ID, Bacteria PROTEUS MIRABILIS (A)  Final      Susceptibility   Proteus mirabilis - MIC*    AMPICILLIN <=2 SENSITIVE Sensitive     CEFAZOLIN <=4 SENSITIVE Sensitive     CEFTRIAXONE <=1 SENSITIVE Sensitive     CIPROFLOXACIN 1 SENSITIVE Sensitive     GENTAMICIN >=16 RESISTANT Resistant     IMIPENEM 2 SENSITIVE Sensitive     NITROFURANTOIN 128 RESISTANT Resistant     TRIMETH/SULFA >=320  RESISTANT Resistant     AMPICILLIN/SULBACTAM <=2 SENSITIVE Sensitive     PIP/TAZO <=4 SENSITIVE Sensitive     * >=100,000 COLONIES/mL PROTEUS MIRABILIS         Radiology Studies: Dg Chest 2 View  Result Date: 11/30/2018 CLINICAL DATA:  Unwitnessed fall. EXAM: CHEST - 2 VIEW COMPARISON:  09/11/2012 chest radiograph FINDINGS: This is a low volume film with mild bibasilar atelectasis. There is no evidence of focal airspace disease, pulmonary edema, suspicious pulmonary nodule/mass, pleural effusion, or pneumothorax. No acute bony abnormalities are identified. IMPRESSION: Low volume film with mild bibasilar atelectasis. Electronically Signed   By: Harmon Pier M.D.   On: 11/30/2018 14:17   Dg Lumbar Spine Complete  Result Date: 11/30/2018 CLINICAL DATA:  Acute low back pain following fall. Initial encounter. EXAM: LUMBAR SPINE - COMPLETE 4+ VIEW COMPARISON:  None. FINDINGS: No acute fracture or subluxation. Moderate multilevel degenerative disc disease and facet arthropathy again noted. No focal bony  lesions are identified. Aortic atherosclerotic calcifications are noted. IMPRESSION: 1. No evidence of acute bony abnormality. 2. Moderate multilevel degenerative disc disease and facet arthropathy. 3.  Aortic Atherosclerosis (ICD10-I70.0). Electronically Signed   By: Harmon Pier M.D.   On: 11/30/2018 14:10   Dg Pelvis 1-2 Views  Result Date: 11/30/2018 CLINICAL DATA:  Acute pelvic pain following fall. Initial encounter. EXAM: PELVIS - 1-2 VIEW COMPARISON:  None. FINDINGS: There is no evidence of pelvic fracture or diastasis. No pelvic bone lesions are seen. IMPRESSION: Negative. Electronically Signed   By: Harmon Pier M.D.   On: 11/30/2018 14:07   Dg Shoulder Right  Result Date: 11/30/2018 CLINICAL DATA:  Acute RIGHT shoulder pain following fall. Initial encounter. EXAM: RIGHT SHOULDER - 2+ VIEW COMPARISON:  None. FINDINGS: No acute fracture, subluxation or dislocation. Degenerative changes at  the glenohumeral and AC joints noted. No focal bony lesions are present. IMPRESSION: No evidence of acute bony abnormality. Electronically Signed   By: Harmon Pier M.D.   On: 11/30/2018 14:03   Ct Head Wo Contrast  Result Date: 11/30/2018 CLINICAL DATA:  Fall, headache EXAM: CT HEAD WITHOUT CONTRAST CT CERVICAL SPINE WITHOUT CONTRAST TECHNIQUE: Multidetector CT imaging of the head and cervical spine was performed following the standard protocol without intravenous contrast. Multiplanar CT image reconstructions of the cervical spine were also generated. COMPARISON:  CT head dated 07/13/2016 FINDINGS: CT HEAD FINDINGS Brain: No evidence of acute infarction, hemorrhage, hydrocephalus, extra-axial collection or mass lesion/mass effect. Global cortical and central atrophy. Secondary ventricular prominence. Subcortical white matter and periventricular small vessel ischemic changes. Vascular: Intracranial atherosclerosis. Left vertebral artery aneurysm clips. Skull: Prior suboccipital craniectomy. Negative for fracture focal lesion. Sinuses/Orbits: Partial opacification of the right maxillary sinus. Visualized paranasal sinuses mastoid air cells are otherwise clear. Other: None. CT CERVICAL SPINE FINDINGS Alignment: Normal cervical lordosis. Skull base and vertebrae: No acute fracture. No primary bone lesion or focal pathologic process. Soft tissues and spinal canal: No prevertebral fluid or swelling. No visible canal hematoma. Disc levels: Moderate multilevel degenerative changes. Spinal canal is patent. Upper chest: Visualized lung apices are notable for right apical pleural-parenchymal scarring. Other: Visualized thyroid is notable for a 13 mm right thyroid nodule. IMPRESSION: No evidence of acute intracranial abnormality. Atrophy with small vessel ischemic changes. Prior suboccipital craniectomy and postprocedural changes related to prior left vertebral artery aneurysm clipping. No evidence medic injury to  cervical spine. Moderate multilevel degenerative changes. Electronically Signed   By: Charline Bills M.D.   On: 11/30/2018 14:34   Ct Cervical Spine Wo Contrast  Result Date: 11/30/2018 CLINICAL DATA:  Fall, headache EXAM: CT HEAD WITHOUT CONTRAST CT CERVICAL SPINE WITHOUT CONTRAST TECHNIQUE: Multidetector CT imaging of the head and cervical spine was performed following the standard protocol without intravenous contrast. Multiplanar CT image reconstructions of the cervical spine were also generated. COMPARISON:  CT head dated 07/13/2016 FINDINGS: CT HEAD FINDINGS Brain: No evidence of acute infarction, hemorrhage, hydrocephalus, extra-axial collection or mass lesion/mass effect. Global cortical and central atrophy. Secondary ventricular prominence. Subcortical white matter and periventricular small vessel ischemic changes. Vascular: Intracranial atherosclerosis. Left vertebral artery aneurysm clips. Skull: Prior suboccipital craniectomy. Negative for fracture focal lesion. Sinuses/Orbits: Partial opacification of the right maxillary sinus. Visualized paranasal sinuses mastoid air cells are otherwise clear. Other: None. CT CERVICAL SPINE FINDINGS Alignment: Normal cervical lordosis. Skull base and vertebrae: No acute fracture. No primary bone lesion or focal pathologic process. Soft tissues and spinal canal: No prevertebral fluid or swelling. No  visible canal hematoma. Disc levels: Moderate multilevel degenerative changes. Spinal canal is patent. Upper chest: Visualized lung apices are notable for right apical pleural-parenchymal scarring. Other: Visualized thyroid is notable for a 13 mm right thyroid nodule. IMPRESSION: No evidence of acute intracranial abnormality. Atrophy with small vessel ischemic changes. Prior suboccipital craniectomy and postprocedural changes related to prior left vertebral artery aneurysm clipping. No evidence medic injury to cervical spine. Moderate multilevel degenerative changes.  Electronically Signed   By: Charline Bills M.D.   On: 11/30/2018 14:34   Dg Shoulder Left  Result Date: 11/30/2018 CLINICAL DATA:  Acute LEFT shoulder pain following fall. Initial encounter. EXAM: LEFT SHOULDER - 2+ VIEW COMPARISON:  None. FINDINGS: No acute fracture, subluxation or dislocation. Degenerative changes at the glenohumeral and AC joints noted. No focal bony lesions are present. IMPRESSION: No acute abnormality. Electronically Signed   By: Harmon Pier M.D.   On: 11/30/2018 14:16   Dg Knee Complete 4 Views Left  Result Date: 11/30/2018 CLINICAL DATA:  Acute LEFT knee pain following fall. Initial encounter. EXAM: LEFT KNEE - COMPLETE 4+ VIEW COMPARISON:  12/19/2017 radiographs FINDINGS: MEDIAL compartment hemiarthroplasty again noted. No acute fracture or dislocation noted. No definite knee effusion noted. Moderate to severe degenerative changes in the LATERAL and patellofemoral compartment again identified. IMPRESSION: 1. No evidence of acute abnormality. Electronically Signed   By: Harmon Pier M.D.   On: 11/30/2018 14:06   Dg Knee Complete 4 Views Right  Result Date: 11/30/2018 CLINICAL DATA:  Acute RIGHT knee pain following fall. Initial encounter. EXAM: RIGHT KNEE - COMPLETE 4+ VIEW COMPARISON:  None. FINDINGS: RIGHT total knee arthroplasty noted. No evidence acute fracture or dislocation. No joint effusion noted. No bony lesions are present. IMPRESSION: 1. No evidence of acute abnormality. Electronically Signed   By: Harmon Pier M.D.   On: 11/30/2018 14:15   Dg Foot Complete Left  Result Date: 11/30/2018 CLINICAL DATA:  Acute LEFT foot pain following fall. Initial encounter. EXAM: LEFT FOOT - COMPLETE 3+ VIEW COMPARISON:  None. FINDINGS: No acute fracture, subluxation or dislocation. Mild degenerative changes at the 1st MTP joint noted. No focal bony lesions are present. IMPRESSION: No evidence of acute bony abnormality. Electronically Signed   By: Harmon Pier M.D.   On:  11/30/2018 14:14   Dg Foot Complete Right  Result Date: 11/30/2018 CLINICAL DATA:  Acute RIGHT foot pain following fall. Initial encounter. EXAM: RIGHT FOOT COMPLETE - 3+ VIEW COMPARISON:  None. FINDINGS: No acute fracture, subluxation or dislocation. Mild hallux valgus and degenerative changes of 1st MTP joint noted. A small calcaneal spur is present. No soft tissue swelling noted. IMPRESSION: 1. No evidence of acute abnormality. Electronically Signed   By: Harmon Pier M.D.   On: 11/30/2018 14:12        Scheduled Meds: . enoxaparin (LOVENOX) injection  40 mg Subcutaneous Q24H  . insulin aspart  0-9 Units Subcutaneous TID WC  . mupirocin cream   Topical Daily  . sodium chloride flush  3 mL Intravenous Q12H   Continuous Infusions: . cefTRIAXone (ROCEPHIN)  IV 1 g (12/01/18 1708)     LOS: 1 day    Time spent: 35 min    Burke Keels, MD [facility]  If 7PM-7AM, please contact night-coverage  12/02/2018, 1:23 PM

## 2018-12-02 NOTE — Discharge Summary (Signed)
Physician Discharge Summary  Kristina Wilkinson [id number] DOB: August 05, 1927 DOA: 11/30/2018  PCP: Elby Beck, FNP  Admit date: 11/30/2018 Discharge date: 12/02/2018  Admitted From: Inpatient Disposition: home  Recommendations for Outpatient Follow-up:  1. Follow up with PCP in 1-2 weeks   Home Health:Yes Equipment/Devices:rolling walker  Discharge Condition:Stable CODE STATUS:Full code Diet recommendation: Regular healthy diet  Brief/Interim Summary: Briefly Kristina Wilkinson is an 85 year old female with history of hypertension, history of intracranial aneurysm clipping in 1980s and 90s with mild chronic right lower extremity weakness, chronic low back pain, type 2 diabetes, peripheral neuropathy, arthritis reports tripping over a rug PTA in front of her sink hit her head and fell down, was not able to get back up, was found by son the following day and brought to the emergency room, in the ED noted to have elevated CK, abnormal urinalysis with positive nitrites, no acute fractures noted on imaging studies  While in the hospital patient was treated for rhabdomyolysis.  She was gently hydrated labs improved CK came down from 7000.  To 600 renal function was preserved.  Patient stable without complications. She was seen by PT OT with recommendations for supervision at home versus SNF.  Patient is lined skilled nursing facility and reports she will have assistance at home.  Will send home with home health with PT and rolling walker. Patient also did have a UTI with urine culture showing Proteus.  Should be discharged home with antibiotics.  Discharge Diagnoses:  Principal Problem:   Traumatic rhabdomyolysis [facility]) Active Problems:   Diabetes mellitus without complication (HCC)   Transaminitis   Abnormal urinalysis   Leukocytosis   Hypokalemia   Open toe wound    Discharge Instructions  Discharge Instructions    Diet - low sodium heart healthy   Complete by: As directed     Increase activity slowly   Complete by: As directed      Allergies as of 12/02/2018      Reactions   Gabapentin Other (See Comments)   Made the legs "go numb"   Lyrica [pregabalin] Other (See Comments)   "Made me pass out"   Other Other (See Comments)   "MD mixed a blood pressure medication with the Clinoril I was taking and I couldn't walk"   Penicillins Other (See Comments)   Has patient had a PCN reaction causing immediate rash, facial/tongue/throat swelling, SOB or lightheadedness with hypotension: Unk- exact reaction not recalled, but is allergic Has patient had a PCN reaction causing severe rash involving mucus membranes or skin necrosis: Unk Has patient had a PCN reaction that required hospitalization: Unk Has patient had a PCN reaction occurring within the last 10 years: Yes If all of the above answers are "NO", then may proceed with Cephalosporin use.   Vit D-vit E-safflower Oil Other (See Comments)   "Made me achy and sick"      Medication List    TAKE these medications   ciprofloxacin 500 MG tablet Commonly known as: Cipro Take 1 tablet (500 mg total) by mouth 2 (two) times daily for 10 days.            Durable Medical Equipment  (From admission, onward)         Start     Ordered   12/02/18 0810  For home use only DME Walker rolling  Once    Question:  Patient needs a walker to treat with the following condition  Answer:  Weakness generalized   12/02/18 0809  Allergies  Allergen Reactions  . Gabapentin Other (See Comments)    Made the legs "go numb"  . Lyrica [Pregabalin] Other (See Comments)    "Made me pass out"  . Other Other (See Comments)    "MD mixed a blood pressure medication with the Clinoril I was taking and I couldn't walk"  . Penicillins Other (See Comments)    Has patient had a PCN reaction causing immediate rash, facial/tongue/throat swelling, SOB or lightheadedness with hypotension: Unk- exact reaction not recalled, but is  allergic Has patient had a PCN reaction causing severe rash involving mucus membranes or skin necrosis: Unk Has patient had a PCN reaction that required hospitalization: Unk Has patient had a PCN reaction occurring within the last 10 years: Yes If all of the above answers are "NO", then may proceed with Cephalosporin use.   Kristina Wilkinson D-Vit E-Safflower Oil Other (See Comments)    "Made me achy and sick"    Consultations:  None   Procedures/Studies: Dg Chest 2 View  Result Date: 11/30/2018 CLINICAL DATA:  Unwitnessed fall. EXAM: CHEST - 2 VIEW COMPARISON:  09/11/2012 chest radiograph FINDINGS: This is a low volume film with mild bibasilar atelectasis. There is no evidence of focal airspace disease, pulmonary edema, suspicious pulmonary nodule/mass, pleural effusion, or pneumothorax. No acute bony abnormalities are identified. IMPRESSION: Low volume film with mild bibasilar atelectasis. Electronically Signed   By: Margarette Canada M.D.   On: 11/30/2018 14:17   Dg Lumbar Spine Complete  Result Date: 11/30/2018 CLINICAL DATA:  Acute low back pain following fall. Initial encounter. EXAM: LUMBAR SPINE - COMPLETE 4+ VIEW COMPARISON:  None. FINDINGS: No acute fracture or subluxation. Moderate multilevel degenerative disc disease and facet arthropathy again noted. No focal bony lesions are identified. Aortic atherosclerotic calcifications are noted. IMPRESSION: 1. No evidence of acute bony abnormality. 2. Moderate multilevel degenerative disc disease and facet arthropathy. 3.  Aortic Atherosclerosis (ICD10-I70.0). Electronically Signed   By: Margarette Canada M.D.   On: 11/30/2018 14:10   Dg Pelvis 1-2 Views  Result Date: 11/30/2018 CLINICAL DATA:  Acute pelvic pain following fall. Initial encounter. EXAM: PELVIS - 1-2 VIEW COMPARISON:  None. FINDINGS: There is no evidence of pelvic fracture or diastasis. No pelvic bone lesions are seen. IMPRESSION: Negative. Electronically Signed   By: Margarette Canada M.D.   On:  11/30/2018 14:07   Dg Shoulder Right  Result Date: 11/30/2018 CLINICAL DATA:  Acute RIGHT shoulder pain following fall. Initial encounter. EXAM: RIGHT SHOULDER - 2+ VIEW COMPARISON:  None. FINDINGS: No acute fracture, subluxation or dislocation. Degenerative changes at the glenohumeral and AC joints noted. No focal bony lesions are present. IMPRESSION: No evidence of acute bony abnormality. Electronically Signed   By: Margarette Canada M.D.   On: 11/30/2018 14:03   Ct Head Wo Contrast  Result Date: 11/30/2018 CLINICAL DATA:  Fall, headache EXAM: CT HEAD WITHOUT CONTRAST CT CERVICAL SPINE WITHOUT CONTRAST TECHNIQUE: Multidetector CT imaging of the head and cervical spine was performed following the standard protocol without intravenous contrast. Multiplanar CT image reconstructions of the cervical spine were also generated. COMPARISON:  CT head dated 07/13/2016 FINDINGS: CT HEAD FINDINGS Brain: No evidence of acute infarction, hemorrhage, hydrocephalus, extra-axial collection or mass lesion/mass effect. Global cortical and central atrophy. Secondary ventricular prominence. Subcortical white matter and periventricular small vessel ischemic changes. Vascular: Intracranial atherosclerosis. Left vertebral artery aneurysm clips. Skull: Prior suboccipital craniectomy. Negative for fracture focal lesion. Sinuses/Orbits: Partial opacification of the right maxillary sinus. Visualized paranasal  sinuses mastoid air cells are otherwise clear. Other: None. CT CERVICAL SPINE FINDINGS Alignment: Normal cervical lordosis. Skull base and vertebrae: No acute fracture. No primary bone lesion or focal pathologic process. Soft tissues and spinal canal: No prevertebral fluid or swelling. No visible canal hematoma. Disc levels: Moderate multilevel degenerative changes. Spinal canal is patent. Upper chest: Visualized lung apices are notable for right apical pleural-parenchymal scarring. Other: Visualized thyroid is notable for a 13 mm  right thyroid nodule. IMPRESSION: No evidence of acute intracranial abnormality. Atrophy with small vessel ischemic changes. Prior suboccipital craniectomy and postprocedural changes related to prior left vertebral artery aneurysm clipping. No evidence medic injury to cervical spine. Moderate multilevel degenerative changes. Electronically Signed   By: Julian Hy M.D.   On: 11/30/2018 14:34   Ct Cervical Spine Wo Contrast  Result Date: 11/30/2018 CLINICAL DATA:  Fall, headache EXAM: CT HEAD WITHOUT CONTRAST CT CERVICAL SPINE WITHOUT CONTRAST TECHNIQUE: Multidetector CT imaging of the head and cervical spine was performed following the standard protocol without intravenous contrast. Multiplanar CT image reconstructions of the cervical spine were also generated. COMPARISON:  CT head dated 07/13/2016 FINDINGS: CT HEAD FINDINGS Brain: No evidence of acute infarction, hemorrhage, hydrocephalus, extra-axial collection or mass lesion/mass effect. Global cortical and central atrophy. Secondary ventricular prominence. Subcortical white matter and periventricular small vessel ischemic changes. Vascular: Intracranial atherosclerosis. Left vertebral artery aneurysm clips. Skull: Prior suboccipital craniectomy. Negative for fracture focal lesion. Sinuses/Orbits: Partial opacification of the right maxillary sinus. Visualized paranasal sinuses mastoid air cells are otherwise clear. Other: None. CT CERVICAL SPINE FINDINGS Alignment: Normal cervical lordosis. Skull base and vertebrae: No acute fracture. No primary bone lesion or focal pathologic process. Soft tissues and spinal canal: No prevertebral fluid or swelling. No visible canal hematoma. Disc levels: Moderate multilevel degenerative changes. Spinal canal is patent. Upper chest: Visualized lung apices are notable for right apical pleural-parenchymal scarring. Other: Visualized thyroid is notable for a 13 mm right thyroid nodule. IMPRESSION: No evidence of acute  intracranial abnormality. Atrophy with small vessel ischemic changes. Prior suboccipital craniectomy and postprocedural changes related to prior left vertebral artery aneurysm clipping. No evidence medic injury to cervical spine. Moderate multilevel degenerative changes. Electronically Signed   By: Julian Hy M.D.   On: 11/30/2018 14:34   Dg Shoulder Left  Result Date: 11/30/2018 CLINICAL DATA:  Acute LEFT shoulder pain following fall. Initial encounter. EXAM: LEFT SHOULDER - 2+ VIEW COMPARISON:  None. FINDINGS: No acute fracture, subluxation or dislocation. Degenerative changes at the glenohumeral and AC joints noted. No focal bony lesions are present. IMPRESSION: No acute abnormality. Electronically Signed   By: Margarette Canada M.D.   On: 11/30/2018 14:16   Dg Knee Complete 4 Views Left  Result Date: 11/30/2018 CLINICAL DATA:  Acute LEFT knee pain following fall. Initial encounter. EXAM: LEFT KNEE - COMPLETE 4+ VIEW COMPARISON:  12/19/2017 radiographs FINDINGS: MEDIAL compartment hemiarthroplasty again noted. No acute fracture or dislocation noted. No definite knee effusion noted. Moderate to severe degenerative changes in the LATERAL and patellofemoral compartment again identified. IMPRESSION: 1. No evidence of acute abnormality. Electronically Signed   By: Margarette Canada M.D.   On: 11/30/2018 14:06   Dg Knee Complete 4 Views Right  Result Date: 11/30/2018 CLINICAL DATA:  Acute RIGHT knee pain following fall. Initial encounter. EXAM: RIGHT KNEE - COMPLETE 4+ VIEW COMPARISON:  None. FINDINGS: RIGHT total knee arthroplasty noted. No evidence acute fracture or dislocation. No joint effusion noted. No bony lesions are present. IMPRESSION: 1.  No evidence of acute abnormality. Electronically Signed   By: Margarette Canada M.D.   On: 11/30/2018 14:15   Dg Foot Complete Left  Result Date: 11/30/2018 CLINICAL DATA:  Acute LEFT foot pain following fall. Initial encounter. EXAM: LEFT FOOT - COMPLETE 3+ VIEW  COMPARISON:  None. FINDINGS: No acute fracture, subluxation or dislocation. Mild degenerative changes at the 1st MTP joint noted. No focal bony lesions are present. IMPRESSION: No evidence of acute bony abnormality. Electronically Signed   By: Margarette Canada M.D.   On: 11/30/2018 14:14   Dg Foot Complete Right  Result Date: 11/30/2018 CLINICAL DATA:  Acute RIGHT foot pain following fall. Initial encounter. EXAM: RIGHT FOOT COMPLETE - 3+ VIEW COMPARISON:  None. FINDINGS: No acute fracture, subluxation or dislocation. Mild hallux valgus and degenerative changes of 1st MTP joint noted. A small calcaneal spur is present. No soft tissue swelling noted. IMPRESSION: 1. No evidence of acute abnormality. Electronically Signed   By: Margarette Canada M.D.   On: 11/30/2018 14:12       Subjective: Patient reports doing well, does report generalized pain from fall but no focal findings Has known chronic right sided weakness Requested to be discharged home today felt she was close to her baseline  Discharge Exam: Vitals:   12/02/18 0516 12/02/18 0600  BP: (!) 188/71 (!) 164/70  Pulse: 70 62  Resp: 18   Temp: 97.9 F (36.6 C)   SpO2: 96%    Vitals:   12/01/18 2020 12/01/18 2254 12/02/18 0516 12/02/18 0600  BP: (!) 180/64 (!) 182/73 (!) 188/71 (!) 164/70  Pulse: 67 69 70 62  Resp: 18  18   Temp: 98.7 F (37.1 C)  97.9 F (36.6 C)   TempSrc: Oral  Oral   SpO2: 97%  96%     General: Pt is alert, awake, not in acute distress Cardiovascular: RRR, S1/S2 +, no rubs, no gallops Respiratory: CTA bilaterally, no wheezing, no rhonchi Abdominal: Soft, NT, ND, bowel sounds + Extremities: no edema, no cyanosis left toe with dressing no bleedthrough no purulence    The results of significant diagnostics from this hospitalization (including imaging, microbiology, ancillary and laboratory) are listed below for reference.     Microbiology: Recent Results (from the past 240 hour(s))  SARS CORONAVIRUS 2 (TAT  6-24 HRS) Nasopharyngeal Nasopharyngeal Swab     Status: None   Collection Time: 11/30/18  4:05 PM   Specimen: Nasopharyngeal Swab  Result Value Ref Range Status   SARS Coronavirus 2 NEGATIVE NEGATIVE Final    Comment: (NOTE) SARS-CoV-2 target nucleic acids are NOT DETECTED. The SARS-CoV-2 RNA is generally detectable in upper and lower respiratory specimens during the acute phase of infection. Negative results do not preclude SARS-CoV-2 infection, do not rule out co-infections with other pathogens, and should not be used as the sole basis for treatment or other patient management decisions. Negative results must be combined with clinical observations, patient history, and epidemiological information. The expected result is Negative. Fact Sheet for Patients: SugarRoll.be Fact Sheet for Healthcare Providers: https://www.woods-mathews.com/ This test is not yet approved or cleared by the [place] FDA and  has been authorized for detection and/or diagnosis of SARS-CoV-2 by FDA under an Emergency Use Authorization (EUA). This EUA will remain  in effect (meaning this test can be used) for the duration of the COVID-19 declaration under Section 56 4(b)(1) of the Act, 21 U.S.C. section 360bbb-3(b)(1), unless the authorization is terminated or revoked sooner. Performed at [facility]  Lab, [address] [place], [place] [address]   Urine Culture     Status: Abnormal (Preliminary result)   Collection Time: 11/30/18  8:29 PM   Specimen: Urine, Random  Result Value Ref Range Status   Specimen Description URINE, RANDOM  Final   Special Requests NONE  Final   Culture (A)  Final    >=100,000 COLONIES/mL PROTEUS MIRABILIS SUSCEPTIBILITIES TO FOLLOW Performed at [facility], [place] [address] [place], [place] [address]    Report Status PENDING  Incomplete     Labs: BNP (last 3 results) No results for input(s): BNP in the last 8760  hours. Basic Metabolic Panel: Recent Labs  Lab 11/30/18 1232 12/01/18 0633 12/02/18 0607  NA 141 145 143  K 3.3* 3.4* 4.3  CL 107 112* 113*  CO2 22 25 22   GLUCOSE 145* 100* 120*  BUN 26* 25* 19  CREATININE 0.87 0.72 0.71  CALCIUM 9.9 8.8* 9.0   Liver Function Tests: Recent Labs  Lab 11/30/18 1232 12/01/18 0633 12/02/18 0607  AST 154* 117* 92*  ALT 49* 50* 54*  ALKPHOS 75 56 54  BILITOT 2.0* 1.2 0.6  PROT 6.2* 4.7* 5.2*  ALBUMIN 3.6 2.8* 3.0*   No results for input(s): LIPASE, AMYLASE in the last 168 hours. No results for input(s): AMMONIA in the last 168 hours. CBC: Recent Labs  Lab 11/30/18 1232 12/01/18 0633 12/02/18 0607  WBC 12.7* 9.7 8.3  NEUTROABS 10.5*  --   --   HGB 13.6 11.3* 11.6*  HCT 41.9 32.9* 34.8*  MCV 92.9 90.6 92.6  PLT 174 147* 143*   Cardiac Enzymes: Recent Labs  Lab 11/30/18 1232 12/01/18 0633 12/02/18 0607  CKTOTAL 6,972* 1,634* 654*   BNP: Invalid input(s): POCBNP CBG: Recent Labs  Lab 12/01/18 0647 12/01/18 1130 12/01/18 1634 12/01/18 2019 12/02/18 0648  GLUCAP 110* 149* 125* 137* 110*   D-Dimer No results for input(s): DDIMER in the last 72 hours. Hgb A1c Recent Labs    11/30/18 1232  HGBA1C 6.2*   Lipid Profile No results for input(s): CHOL, HDL, LDLCALC, TRIG, CHOLHDL, LDLDIRECT in the last 72 hours. Thyroid function studies No results for input(s): TSH, T4TOTAL, T3FREE, THYROIDAB in the last 72 hours.  Invalid input(s): FREET3 Anemia work up No results for input(s): VITAMINB12, FOLATE, FERRITIN, TIBC, IRON, RETICCTPCT in the last 72 hours. Urinalysis    Component Value Date/Time   COLORURINE YELLOW 11/30/2018 1553   APPEARANCEUR CLEAR 11/30/2018 1553   LABSPEC 1.023 11/30/2018 1553   PHURINE 5.0 11/30/2018 1553   GLUCOSEU 50 (A) 11/30/2018 1553   GLUCOSEU NEGATIVE 01/16/2011 0914   HGBUR LARGE (A) 11/30/2018 1553   BILIRUBINUR NEGATIVE 11/30/2018 1553   BILIRUBINUR neg 05/01/2017 1220   KETONESUR 20  (A) 11/30/2018 1553   PROTEINUR 100 (A) 11/30/2018 1553   UROBILINOGEN 0.2 05/01/2017 1220   UROBILINOGEN 0.2 04/14/2012 0930   NITRITE POSITIVE (A) 11/30/2018 1553   LEUKOCYTESUR NEGATIVE 11/30/2018 1553   Sepsis Labs Invalid input(s): PROCALCITONIN,  WBC,  LACTICIDVEN Microbiology Recent Results (from the past 240 hour(s))  SARS CORONAVIRUS 2 (TAT 6-24 HRS) Nasopharyngeal Nasopharyngeal Swab     Status: None   Collection Time: 11/30/18  4:05 PM   Specimen: Nasopharyngeal Swab  Result Value Ref Range Status   SARS Coronavirus 2 NEGATIVE NEGATIVE Final    Comment: (NOTE) SARS-CoV-2 target nucleic acids are NOT DETECTED. The SARS-CoV-2 RNA is generally detectable in upper and lower respiratory specimens during the acute phase of  infection. Negative results do not preclude SARS-CoV-2 infection, do not rule out co-infections with other pathogens, and should not be used as the sole basis for treatment or other patient management decisions. Negative results must be combined with clinical observations, patient history, and epidemiological information. The expected result is Negative. Fact Sheet for Patients: SugarRoll.be Fact Sheet for Healthcare Providers: https://www.woods-mathews.com/ This test is not yet approved or cleared by the [place] FDA and  has been authorized for detection and/or diagnosis of SARS-CoV-2 by FDA under an Emergency Use Authorization (EUA). This EUA will remain  in effect (meaning this test can be used) for the duration of the COVID-19 declaration under Section 56 4(b)(1) of the Act, 21 U.S.C. section 360bbb-3(b)(1), unless the authorization is terminated or revoked sooner. Performed at [facility], [place] [address], [place], [place] [address]   Urine Culture     Status: Abnormal (Preliminary result)   Collection Time: 11/30/18  8:29 PM   Specimen: Urine, Random  Result Value Ref Range Status   Specimen  Description URINE, RANDOM  Final   Special Requests NONE  Final   Culture (A)  Final    >=100,000 COLONIES/mL PROTEUS MIRABILIS SUSCEPTIBILITIES TO FOLLOW Performed at [facility], [place] [address] [place], [place] [address]    Report Status PENDING  Incomplete     Time coordinating discharge: Over 30 minutes  SIGNED:   Nicolette Bang, MD  [facility] 12/02/2018, 8:14 AM Pager   If 7PM-7AM, please contact night-coverage [email or web address] Password [email or web address]

## 2018-12-02 NOTE — Progress Notes (Signed)
Occupational Therapy Evaluation Patient Details Name: Kristina Wilkinson MRN: [id number] DOB: November 09, 1927 Today's Date: 12/02/2018    History of Present Illness Pt is an 85 y/o female who presents s/p mechanical fall at home (tripped over a rug in the bathroom), and being down over 12+ hours. She presents to the ED after son found her at home with rhabdomyolitis. PMH significant for HTN, intracranial aneurysm s/p clipping, DM type II, chronic low back pain, peripheral neuropathy, and arthritis.   Clinical Impression   Nsg called and asked OT to evaluate pt to assist with DC plan. PTA, pt lived alone and was modified independent with ADL and mobility. Friends assisted with shopping and pt has cleaning service 1 x/month. Pt currently driving. Pt currently requires Mod A at times for mobility @ RW level. Mod A for LB ADL. High risk for falls. Pt endorses feeling very weak and her concern for falls. Educated pt on recommendation for snf for rehab, however pt declining snf and wanting to do her "rehab at home". Spoke with Tommi Rumps from [place] and recommend 24/7 S, however if caregivers are limited, increased support in the am and then again later in the day to help pt get ready for bed. Recommend pt ambulate with nursing and walk into bathroom with nursing. Will follow acutely.     Follow Up Recommendations  SNF;Home health OT;Supervision/Assistance - 24 hour(pt refusing snf)    Equipment Recommendations  3 in 1 bedside commode    Recommendations for Other Services       Precautions / Restrictions Precautions Precautions: Fall Restrictions Weight Bearing Restrictions: No      Mobility Bed Mobility Overal bed mobility: Needs Assistance Bed Mobility: Supine to Sit     Supine to sit: Supervision     General bed mobility comments: Pt received sitting up in recliner chair upon PT arrival.   Transfers Overall transfer level: Needs assistance Equipment used: Rolling walker (2 wheeled) Transfers:  Sit to/from Stand Sit to Stand: Min assist;+2 physical assistance;+2 safety/equipment         General transfer comment: VC's for hand placement on seated surface for safety. Assist to power-up to full standing with increased time to transition hands from chair to walker.     Balance Overall balance assessment: History of Falls Sitting-balance support: Feet supported;No upper extremity supported Sitting balance-Leahy Scale: Fair     Standing balance support: No upper extremity supported Standing balance-Leahy Scale: Poor Standing balance comment: Heavily reliant on UE support                            ADL either performed or assessed with clinical judgement   ADL Overall ADL's : Needs assistance/impaired     Grooming: Standing;Minimal assistance   Upper Body Bathing: Set up;Sitting   Lower Body Bathing: Minimal assistance;Sit to/from stand   Upper Body Dressing : Set up;Sitting   Lower Body Dressing: Minimal assistance;Sit to/from stand   Toilet Transfer: RW;Comfort height toilet;Ambulation;Moderate assistance Toilet Transfer Details (indicate cue type and reason): Pt unable to stand without use of grab bars Toileting- Clothing Manipulation and Hygiene: Minimal assistance;Sit to/from stand       Functional mobility during ADLs: Minimal assistance;Rolling walker;Cueing for safety General ADL Comments: Mod A to prevent posterior fall onto toilet. Pt repeating      Vision Baseline Vision/History: Wears glasses Wears Glasses: Reading only       Perception     Praxis  Pertinent Vitals/Pain Pain Assessment: Faces Faces Pain Scale: Hurts little more Pain Location: back and BLE Pain Descriptors / Indicators: Sore Pain Intervention(s): Limited activity within patient's tolerance;Monitored during session;Repositioned     Hand Dominance Right   Extremity/Trunk Assessment Upper Extremity Assessment Upper Extremity Assessment: Generalized weakness    Lower Extremity Assessment Lower Extremity Assessment: Defer to PT evaluation   Cervical / Trunk Assessment Cervical / Trunk Assessment: Kyphotic;Other exceptions(forward head)   Communication Communication Communication: HOH(just a bit)   Cognition Arousal/Alertness: Awake/alert Behavior During Therapy: WFL for tasks assessed/performed Overall Cognitive Status: Within Functional Limits for tasks assessed                                 General Comments: most likely baseline level of funtion. Decreased insight/judgement; fear of SNF; will further assess   General Comments       Exercises     Shoulder Instructions      Home Living Family/patient expects to be discharged to:: Private residence Living Arrangements: Alone Available Help at Discharge: Family;Available PRN/intermittently Type of Home: House Home Access: Stairs to enter [employer] of Steps: 3-4 Entrance Stairs-Rails: Right;Left;Can reach both Home Layout: One level     Bathroom Shower/Tub: Walk-in shower;Tub/shower unit   Bathroom Toilet: Handicapped height Bathroom Accessibility: Yes How Accessible: Accessible via walker Home Equipment: Grab bars - tub/shower;Shower seat - built in;Cane - single point;Walker - 2 wheels          Prior Functioning/Environment Level of Independence: Independent with assistive device(s)  Gait / Transfers Assistance Needed: Uses cane and walker in the house .  ADL's / Homemaking Assistance Needed: Drives, someone goes with her to do shopping- holds to the shopping cart for support. Threasa Beards comes in to clean for her. Pt does the light housework and the aide does the heavy cleaning.             OT Problem List: Decreased strength;Decreased activity tolerance;Impaired balance (sitting and/or standing);Decreased safety awareness;Decreased knowledge of use of DME or AE;Obesity;Pain      OT Treatment/Interventions: Self-care/ADL training;Therapeutic  exercise;Neuromuscular education;DME and/or AE instruction;Therapeutic activities;Cognitive remediation/compensation;Patient/family education;Balance training    OT Goals(Current goals can be found in the care plan section) Acute Rehab OT Goals Patient Stated Goal: "Get rehab at home" OT Goal Formulation: With patient Time For Goal Achievement: 12/16/18 Potential to Achieve Goals: Good  OT Frequency: Min 3X/week   Barriers to D/C: Decreased caregiver support          Co-evaluation              AM-PAC OT "6 Clicks" Daily Activity     Outcome Measure Help from another person eating meals?: None Help from another person taking care of personal grooming?: A Little Help from another person toileting, which includes using toliet, bedpan, or urinal?: A Lot Help from another person bathing (including washing, rinsing, drying)?: A Lot Help from another person to put on and taking off regular upper body clothing?: A Little Help from another person to put on and taking off regular lower body clothing?: A Lot 6 Click Score: 16   End of Session Equipment Utilized During Treatment: Gait belt;Rolling walker Nurse Communication: Mobility status;Other (comment)(DC needs)  Activity Tolerance: Patient tolerated treatment well Patient left: in chair;with call bell/phone within reach;with chair alarm set  OT Visit Diagnosis: Unsteadiness on feet (R26.81);Other abnormalities of gait and mobility (R26.89);History of falling (Z91.81);Pain Pain -  part of body: (back)                Time: 1254-1340 OT Time Calculation (min): 46 min Charges:  OT General Charges $OT Visit: 1 Visit OT Evaluation $OT Eval Moderate Complexity: 1 Mod OT Treatments $Self Care/Home Management : 23-37 mins  Maurie Boettcher, OT/L   Acute OT Clinical Specialist [place] Pager [phone] Office [phone]   [facility] 12/02/2018, 3:12 PM

## 2018-12-02 NOTE — Progress Notes (Signed)
OT Cancellation Note  Patient Details Name: Kristina Wilkinson MRN: [id number] DOB: 1927-08-19   Cancelled Treatment:    Reason Eval/Treat Not Completed: Other (comment). Pt has been set up with [facility]. Will defer OT to [place], OT/L   Acute OT Clinical Specialist [place] Pager [phone] Office [phone]

## 2018-12-02 NOTE — Progress Notes (Signed)
Patient went to the bathroom with the walker with one assist.

## 2018-12-02 NOTE — Plan of Care (Signed)
  Problem: Education: Goal: Knowledge of General Education information will improve Description Including pain rating scale, medication(s)/side effects and non-pharmacologic comfort measures Outcome: Progressing   

## 2018-12-02 NOTE — Progress Notes (Signed)
Pt without safe disposition plan. Refusing SNF per PT's recommendation. [facility] will not be able to supply pt with 24/7 supervision and assist.  NCM made pt aware.Pt adamant about going home. NCM offered  PCS ( private pay), however ,states she can't afford it. Pt can't get out of bed without assistance, unable to care for self independently. Pt with functional limitations, refer to PT's note on 10/19. Has no family or friends to assist with care.  NCM spoke with Bunnie Philips who lives in [place]. [name] states he is unable to assist mom 2/2 his CVA 47yrs ago. MD made aware. PT/OT to work with pt today... hoping to progress pt more today,along with nursing staff. TOC will continue to monitor and follow. Kristina Wilkinson (Son)     [phone]        Whitman Hero RN,BSN,CM

## 2018-12-03 DIAGNOSIS — W19XXXA Unspecified fall, initial encounter: Secondary | ICD-10-CM

## 2018-12-03 DIAGNOSIS — E876 Hypokalemia: Secondary | ICD-10-CM | POA: Diagnosis not present

## 2018-12-03 DIAGNOSIS — E119 Type 2 diabetes mellitus without complications: Secondary | ICD-10-CM | POA: Diagnosis not present

## 2018-12-03 LAB — BASIC METABOLIC PANEL
Anion gap: 9 (ref 5–15)
BUN: 14 mg/dL (ref 8–23)
CO2: 22 mmol/L (ref 22–32)
Calcium: 9.4 mg/dL (ref 8.9–10.3)
Chloride: 110 mmol/L (ref 98–111)
Creatinine, Ser: 0.74 mg/dL (ref 0.44–1.00)
GFR calc Af Amer: >60 mL/min (ref >60)
GFR calc non Af Amer: >60 mL/min (ref >60)
Glucose, Bld: 116 mg/dL — ABNORMAL HIGH (ref 70–99)
Potassium: 4 mmol/L (ref 3.5–5.1)
Sodium: 141 mmol/L (ref 135–145)

## 2018-12-03 LAB — GLUCOSE, CAPILLARY
Glucose-Capillary: 104 mg/dL — ABNORMAL HIGH (ref 70–99)
Glucose-Capillary: 112 mg/dL — ABNORMAL HIGH (ref 70–99)
Glucose-Capillary: 107 mg/dL — ABNORMAL HIGH (ref 70–99)
Glucose-Capillary: 124 mg/dL — ABNORMAL HIGH (ref 70–99)

## 2018-12-03 LAB — CK: Total CK: 400 U/L — ABNORMAL HIGH (ref 38–234)

## 2018-12-03 MED ORDER — AMLODIPINE BESYLATE 5 MG PO TABS
2.5000 mg | ORAL_TABLET | Freq: Every day | ORAL | Status: DC
  Administered 2018-12-03 – 2018-12-04 (×2): 2.5 mg via ORAL
  Filled 2018-12-03 (×2): qty 1

## 2018-12-03 MED ORDER — LISINOPRIL 5 MG PO TABS
5.0000 mg | ORAL_TABLET | Freq: Every day | ORAL | Status: DC
  Administered 2018-12-04: 5 mg via ORAL
  Filled 2018-12-03 (×2): qty 1

## 2018-12-03 MED ORDER — AMLODIPINE BESYLATE 2.5 MG PO TABS: 2.5000 mg | ORAL_TABLET | Freq: Every day | ORAL | 0 refills | Status: DC

## 2018-12-03 MED ORDER — LISINOPRIL 5 MG PO TABS: 5.0000 mg | ORAL_TABLET | Freq: Every day | ORAL | 0 refills | Status: DC

## 2018-12-03 NOTE — Progress Notes (Signed)
NT notified this RN that patient's BP is 188/75 mmHg. Pt.'s refused PRN hydralazine. Pt.'s states " it will go down on its own, I just need to pee the fluids out". This RN explained and educated the pt. NP Baltazar Najjar made aware. Will continue to monitor the patient.

## 2018-12-03 NOTE — Progress Notes (Signed)
PROGRESS NOTE    Kristina Wilkinson  [id number] DOB: 05/30/1927 DOA: 11/30/2018 PCP: Elby Beck, FNP    Brief Narrative:  85 year old female with history of hypertension, history of intracranial aneurysm clipping in 1980s and 90s with mild chronic right lower extremity weakness, chronic low back pain, type 2 diabetes, peripheral neuropathy, arthritis reports tripping over a rug yesterday in front of her sink hit her head and fell down, was not able to get back up, was found by son the following day and brought to the emergency room, in the ED noted to have elevated CK, abnormal urinalysis with positive nitrites, no acute fractures noted on imaging studies  Assessment & Plan:   Principal Problem:   Traumatic rhabdomyolysis [facility]) Active Problems:   Diabetes mellitus without complication (HCC)   Transaminitis   Abnormal urinalysis   Leukocytosis   Hypokalemia   Open toe wound   Traumatic rhabdomyolysissecondary to fall:  -Acute.Patient presentedafter tripping over a rug at home.  -She was unable to getupfor over 12 hours.  -Imaging studies did not show any acute fractures.  -Presenting CK of 7000, trended down to 400 with IVF hydration -Renal function remains stable at this time -Therapy recommendations for SNF, however pt adamantly refuses. There are concerns that patient is requiring two person assist, thus dispo planning remains in progress  Leukocytosis:  -Acute.  -WBC 12.7 on admission, resolved  Cellulitis/ left great toe wound:  -Acute.  -Patient with redness and erythema around skin abrasions related with fall.Also with wounds to left great toe and second toe due to dropping a can on toe last week. Xray without fx. Evaluated by wound care who recommend topical treatment. Patient is afebrile and non-toxic appearing -Pt is on rocphin  Proteus UTI: -Urinalysis positive for hemoglobin and nitrite positive. -Urine culture positive for proteus  mirabilis -continued on rocephin  Prolonged QT interval: Acute. Initial QTc 503 on admission -Seems medically stable at this time  Hypokalemia:  -Corrected -Repeat BMET in AM, cont to correct as needed  Transaminitis:  -Acute.AST and ALT evaluate.  -Likely secondary to rhabdomyolysis. Trended down  Hypertensive urgency: -remains very poorly controlled -See documentation. Patient has been refusing even PRN hydralazine -This AM, patient cites concerns of possible drug reactions related to hypertensive medications. When asked, patient only concerned with vague effects such as feeling warm, weakness.  -Patient is alert and has insight to her care. She is able to verbalize back to me my concern of spontaneous aneurysmal bleed and even death with her poorly treated blood pressure. She also acknowledges possible microvascular damage associated with hypertensive crisis, however still very reluctant to take BP meds. -Patient is now agreeable to trial of low dose norvasc, which likely will not be sufficient to significantly affect her BP. This afternoon, pt was alarmed about feeling "warm" after taking her first dose of norvasc -Will consult Palliative Care - Would a MOST form be appropriate for this patient?  Diabetes mellitus type 2:  -Last hemoglobin A1c noted to be 6.4 on 04/2017. Patient not on any diabetic medications. -Hypoglycemic protocols -CBGs before every meal with sensitive SSI  History of aneurysms/pclipping -Seems stable at this time  DVT prophylaxis: Lovenox sub Q Code Status: Full Family Communication: Pt in room, family not at bedside Disposition Plan: Uncertain at this time  Consultants:   Palliative Care  Procedures:     Antimicrobials: Anti-infectives (From admission, onward)   Start     Dose/Rate Route Frequency Ordered Stop   12/02/18  0000  ciprofloxacin (CIPRO) 500 MG tablet     500 mg Oral 2 times daily 12/02/18 0814 12/12/18 2359   11/30/18  1700  cefTRIAXone (ROCEPHIN) 1 g in sodium chloride 0.9 % 100 mL IVPB     1 g 200 mL/hr over 30 Minutes Intravenous Every 24 hours 11/30/18 1649         Subjective: Very hesitant to take BP med  Objective: Vitals:   12/02/18 2141 12/03/18 0445 12/03/18 0914 12/03/18 1623  BP: (!) 178/59 (!) 188/75 (!) 169/82 (!) 237/76  Pulse: 66 69 72 69  Resp: 18 18 18 18   Temp: 98 F (36.7 C) 97.8 F (36.6 C) 97.7 F (36.5 C) 98 F (36.7 C)  TempSrc:  Oral Oral Oral  SpO2: 97% 96% 98% 98%    Intake/Output Summary (Last 24 hours) at 12/03/2018 1659 Last data filed at 12/03/2018 1300 Gross per 24 hour  Intake 1160 ml  Output 400 ml  Net 760 ml   There were no vitals filed for this visit.  Examination:  General exam: Appears calm and comfortable  Respiratory system: Clear to auscultation. Respiratory effort normal. Cardiovascular system: S1 & S2 heard, RRR. Gastrointestinal system: Abdomen is nondistended, soft and nontender. No organomegaly or masses felt. Normal bowel sounds heard. Central nervous system: Alert and oriented. No focal neurological deficits. Extremities: Symmetric 5 x 5 power. Skin: No rashes, lesions Psychiatry: Judgement and insight appear normal. Mood & affect appropriate.   Data Reviewed: I have personally reviewed following labs and imaging studies  CBC: Recent Labs  Lab 11/30/18 1232 12/01/18 0633 12/02/18 0607  WBC 12.7* 9.7 8.3  NEUTROABS 10.5*  --   --   HGB 13.6 11.3* 11.6*  HCT 41.9 32.9* 34.8*  MCV 92.9 90.6 92.6  PLT 174 147* [id number]*   Basic Metabolic Panel: Recent Labs  Lab 11/30/18 1232 12/01/18 0633 12/02/18 0607 12/03/18 0525  NA 141 145 143 141  K 3.3* 3.4* 4.3 4.0  CL 107 112* 113* 110  CO2 22 25 22 22   GLUCOSE 145* 100* 120* 116*  BUN 26* 25* 19 14  CREATININE 0.87 0.72 0.71 0.74  CALCIUM 9.9 8.8* 9.0 9.4   GFR: CrCl cannot be calculated (Unknown ideal weight.). Liver Function Tests: Recent Labs  Lab 11/30/18 1232  12/01/18 0633 12/02/18 0607  AST 154* 117* 92*  ALT 49* 50* 54*  ALKPHOS 75 56 54  BILITOT 2.0* 1.2 0.6  PROT 6.2* 4.7* 5.2*  ALBUMIN 3.6 2.8* 3.0*   No results for input(s): LIPASE, AMYLASE in the last 168 hours. No results for input(s): AMMONIA in the last 168 hours. Coagulation Profile: No results for input(s): INR, PROTIME in the last 168 hours. Cardiac Enzymes: Recent Labs  Lab 11/30/18 1232 12/01/18 0633 12/02/18 0607 12/03/18 0525  CKTOTAL 6,972* 1,634* 654* 400*   BNP (last 3 results) No results for input(s): PROBNP in the last 8760 hours. HbA1C: No results for input(s): HGBA1C in the last 72 hours. CBG: Recent Labs  Lab 12/02/18 1633 12/02/18 2140 12/03/18 0703 12/03/18 1123 12/03/18 1614  GLUCAP 104* 126* 104* 112* 107*   Lipid Profile: No results for input(s): CHOL, HDL, LDLCALC, TRIG, CHOLHDL, LDLDIRECT in the last 72 hours. Thyroid Function Tests: No results for input(s): TSH, T4TOTAL, FREET4, T3FREE, THYROIDAB in the last 72 hours. Anemia Panel: No results for input(s): VITAMINB12, FOLATE, FERRITIN, TIBC, IRON, RETICCTPCT in the last 72 hours. Sepsis Labs: No results for input(s): PROCALCITON, LATICACIDVEN in the last 168 hours.  Recent Results (from the past 240 hour(s))  SARS CORONAVIRUS 2 (TAT 6-24 HRS) Nasopharyngeal Nasopharyngeal Swab     Status: None   Collection Time: 11/30/18  4:05 PM   Specimen: Nasopharyngeal Swab  Result Value Ref Range Status   SARS Coronavirus 2 NEGATIVE NEGATIVE Final    Comment: (NOTE) SARS-CoV-2 target nucleic acids are NOT DETECTED. The SARS-CoV-2 RNA is generally detectable in upper and lower respiratory specimens during the acute phase of infection. Negative results do not preclude SARS-CoV-2 infection, do not rule out co-infections with other pathogens, and should not be used as the sole basis for treatment or other patient management decisions. Negative results must be combined with clinical observations,  patient history, and epidemiological information. The expected result is Negative. Fact Sheet for Patients: SugarRoll.be Fact Sheet for Healthcare Providers: https://www.woods-mathews.com/ This test is not yet approved or cleared by the [place] FDA and  has been authorized for detection and/or diagnosis of SARS-CoV-2 by FDA under an Emergency Use Authorization (EUA). This EUA will remain  in effect (meaning this test can be used) for the duration of the COVID-19 declaration under Section 56 4(b)(1) of the Act, 21 U.S.C. section 360bbb-3(b)(1), unless the authorization is terminated or revoked sooner. Performed at [facility], [place] [address], [place], [place] [address]   Urine Culture     Status: Abnormal (Preliminary result)   Collection Time: 11/30/18  8:29 PM   Specimen: Urine, Random  Result Value Ref Range Status   Specimen Description URINE, RANDOM  Final   Special Requests NONE  Final   Culture (A)  Final    >=100,000 COLONIES/mL PROTEUS MIRABILIS CULTURE REINCUBATED FOR BETTER GROWTH Performed at [facility], [place] [address], [place], [place] [address]    Report Status PENDING  Incomplete   Organism ID, Bacteria PROTEUS MIRABILIS (A)  Final      Susceptibility   Proteus mirabilis - MIC*    AMPICILLIN <=2 SENSITIVE Sensitive     CEFAZOLIN <=4 SENSITIVE Sensitive     CEFTRIAXONE <=1 SENSITIVE Sensitive     CIPROFLOXACIN 1 SENSITIVE Sensitive     GENTAMICIN >=16 RESISTANT Resistant     IMIPENEM 2 SENSITIVE Sensitive     NITROFURANTOIN 128 RESISTANT Resistant     TRIMETH/SULFA >=320 RESISTANT Resistant     AMPICILLIN/SULBACTAM <=2 SENSITIVE Sensitive     PIP/TAZO <=4 SENSITIVE Sensitive     * >=100,000 COLONIES/mL PROTEUS MIRABILIS     Radiology Studies: No results found.  Scheduled Meds: . amLODipine  2.5 mg Oral Daily  . enoxaparin (LOVENOX) injection  40 mg Subcutaneous Q24H  . insulin aspart  0-9  Units Subcutaneous TID WC  . lisinopril  5 mg Oral Daily  . mupirocin cream   Topical Daily  . sodium chloride flush  3 mL Intravenous Q12H   Continuous Infusions: . cefTRIAXone (ROCEPHIN)  IV 1 g (12/02/18 1759)     LOS: 2 days   Marylu Lund, MD [facility] Pager On Amion  If 7PM-7AM, please contact night-coverage 12/03/2018, 4:59 PM

## 2018-12-03 NOTE — Progress Notes (Signed)
NT informed this RN patient's BP of 238/38mmHg.  rechecked BP 212/35mmHg. Patient refused PRN Hydralazine. Per patient, she was started with a BP pill this morning. patient also states "it usually go down on its own, if I take BP pill my Blood pressure will go up that is what is happening now, im allergic to BP pill". Text paged NP Baltazar Najjar and made aware. Explained and educated patient. Will continue to monitor.

## 2018-12-04 DIAGNOSIS — T07XXXA Unspecified multiple injuries, initial encounter: Secondary | ICD-10-CM

## 2018-12-04 LAB — COMPREHENSIVE METABOLIC PANEL
ALT: 56 U/L — ABNORMAL HIGH (ref 0–44)
AST: 49 U/L — ABNORMAL HIGH (ref 15–41)
Albumin: 3.3 g/dL — ABNORMAL LOW (ref 3.5–5.0)
Alkaline Phosphatase: 61 U/L (ref 38–126)
Anion gap: 9 (ref 5–15)
BUN: 13 mg/dL (ref 8–23)
CO2: 23 mmol/L (ref 22–32)
Calcium: 9.3 mg/dL (ref 8.9–10.3)
Chloride: 109 mmol/L (ref 98–111)
Creatinine, Ser: 0.75 mg/dL (ref 0.44–1.00)
GFR calc Af Amer: >60 mL/min (ref >60)
GFR calc non Af Amer: >60 mL/min (ref >60)
Glucose, Bld: 120 mg/dL — ABNORMAL HIGH (ref 70–99)
Potassium: 3.6 mmol/L (ref 3.5–5.1)
Sodium: 141 mmol/L (ref 135–145)
Total Bilirubin: 0.8 mg/dL (ref 0.3–1.2)
Total Protein: 5.6 g/dL — ABNORMAL LOW (ref 6.5–8.1)

## 2018-12-04 LAB — GLUCOSE, CAPILLARY
Glucose-Capillary: 114 mg/dL — ABNORMAL HIGH (ref 70–99)
Glucose-Capillary: 128 mg/dL — ABNORMAL HIGH (ref 70–99)
Glucose-Capillary: 137 mg/dL — ABNORMAL HIGH (ref 70–99)
Glucose-Capillary: 120 mg/dL — ABNORMAL HIGH (ref 70–99)

## 2018-12-04 LAB — CBC
HCT: 37.5 % (ref 36.0–46.0)
Hemoglobin: 12 g/dL (ref 12.0–15.0)
MCH: 29.6 pg (ref 26.0–34.0)
MCHC: 32 g/dL (ref 30.0–36.0)
MCV: 92.4 fL (ref 80.0–100.0)
Platelets: 182 10*3/uL (ref 150–400)
RBC: 4.06 MIL/uL (ref 3.87–5.11)
RDW: 12.9 % (ref 11.5–15.5)
WBC: 7.2 10*3/uL (ref 4.0–10.5)
nRBC: 0 % (ref 0.0–0.2)

## 2018-12-04 LAB — CK: Total CK: 228 U/L (ref 38–234)

## 2018-12-04 MED ORDER — LISINOPRIL 10 MG PO TABS
10.0000 mg | ORAL_TABLET | Freq: Every day | ORAL | Status: DC
  Administered 2018-12-05: 10 mg via ORAL
  Filled 2018-12-04: qty 1

## 2018-12-04 MED ORDER — AMLODIPINE BESYLATE 5 MG PO TABS
5.0000 mg | ORAL_TABLET | Freq: Every day | ORAL | Status: DC
  Administered 2018-12-05: 5 mg via ORAL
  Filled 2018-12-04: qty 1

## 2018-12-04 NOTE — TOC Progression Note (Signed)
Transition of Care [facility]) - Progression Note    Patient Details  Name: Kristina Wilkinson MRN: [id number] Date of Birth: 1927/06/03  Transition of Care [facility]) CM/SW Contact  Bartholomew Crews, RN Phone Number: [phone] 12/04/2018, 11:58 AM  Clinical Narrative:    Received call from liaison at [facility] that RN could see patient this afternoon is discharged this morning. Patient has been approved for [facility] program. Spoke with MD is concerned with bp and patient's unwillingness to take medications. Patient has now agreed to take medications to help control her bp. MD advised of no discharge today. [facility] aware and will continue to follow for patient readiness to transition home. TOC following for transition needs.    Expected Discharge Plan: [place] Barriers to Discharge: No Barriers Identified  Expected Discharge Plan and Services Expected Discharge Plan: [place] In-house Referral: Clinical Social Work, [facility] Discharge Planning Services: CM Consult Post Acute Care Choice: Home Health, Durable Medical Equipment Living arrangements for the past 2 months: [place] Expected Discharge Date: 12/02/18               DME Arranged: Gilford Rile rolling   Date DME Agency Contacted: 12/01/18 Time DME Agency Contacted: 42 Representative spoke with at DME Agency: Thedore Mins HH Arranged: RN, PT, OT, Nurse's Aide, Social Work [employer] Agency: [place] Date [place]: 12/01/18 Time [place]: 1511 Representative spoke with at [place]: [place] ([place]) Interventions    Readmission Risk Interventions No flowsheet data found.

## 2018-12-04 NOTE — Progress Notes (Signed)
PROGRESS NOTE    Kristina Wilkinson  [id number] DOB: 1927/04/10 DOA: 11/30/2018 PCP: Elby Beck, FNP    Brief Narrative:  85 year old female with history of hypertension, history of intracranial aneurysm clipping in 1980s and 90s with mild chronic right lower extremity weakness, chronic low back pain, type 2 diabetes, peripheral neuropathy, arthritis reports tripping over a rug yesterday in front of her sink hit her head and fell down, was not able to get back up, was found by son the following day and brought to the emergency room, in the ED noted to have elevated CK, abnormal urinalysis with positive nitrites, no acute fractures noted on imaging studies  Assessment & Plan:   Principal Problem:   Traumatic rhabdomyolysis [facility]) Active Problems:   Diabetes mellitus without complication (HCC)   Transaminitis   Abnormal urinalysis   Leukocytosis   Hypokalemia   Open toe wound   Traumatic rhabdomyolysissecondary to fall:  -Acute.Patient presentedafter tripping over a rug at home.  -She was unable to getupfor over 12 hours.  -Imaging studies did not show any acute fractures.  -Presenting CK of 7000, trended down to 400 with IVF hydration -Renal function remains stable at this time -Therapy recommendations for SNF, however pt adamantly refused. Therapy later recommends HH as pt refused SNF  Leukocytosis:  -Acute.  -WBC 12.7 on admission -Normalized  Cellulitis/ left great toe wound:  -Acute.  -Patient with redness and erythema around skin abrasions related with fall.Also with wounds to left great toe and second toe due to dropping a can on toe last week. Xray without fx. Evaluated by wound care who recommend topical treatment. Patient is afebrile and non-toxic appearing -Continued on rocephin  Proteus UTI: -Urinalysis positive for hemoglobin and nitrite positive. -Urine culture positive for proteus mirabilis -continued on rocephin  Prolonged QT  interval: Acute. Initial QTc 503 on admission -Remains stable at this time  Hypokalemia:  -Normalized -Recheck BMET in AM  Transaminitis:  -Acute.AST and ALT improving -Likely secondary to rhabdomyolysis.  Hypertensive urgency: -remains very poorly controlled -See prior note. Patient has insight to care -Patient is now agreeing to trial of low dose anti-hypertensive meds, currently on low dose norvasc and lisinopril -Would continue to titrate BP meds as tolerated  Diabetes mellitus type 2:  -Last hemoglobin A1c noted to be 6.4 on 04/2017.  -Patient not on any diabetic medications prior to admit. -Cont to check CBGs before every meal with sensitive SSI  History of aneurysms/pclipping -Seems stable at this time  End of Life -See prior note. Pt is at high risk for acute decompensation given poorly controlled BP -Palliative Care was consulted, consider if MOST form is appropriate for this patient  DVT prophylaxis: Lovenox sub Q Code Status: Full Family Communication: Pt in room, family not at bedside Disposition Plan: Home with home health when BP has improved  Consultants:   Palliative Care  Procedures:     Antimicrobials: Anti-infectives (From admission, onward)   Start     Dose/Rate Route Frequency Ordered Stop   12/02/18 0000  ciprofloxacin (CIPRO) 500 MG tablet     500 mg Oral 2 times daily 12/02/18 0814 12/12/18 2359   11/30/18 1700  cefTRIAXone (ROCEPHIN) 1 g in sodium chloride 0.9 % 100 mL IVPB     1 g 200 mL/hr over 30 Minutes Intravenous Every 24 hours 11/30/18 1649        Subjective: Very hesitant to take BP med  Objective: Vitals:   12/04/18 0049 12/04/18 0418 12/04/18  0912 12/04/18 1100  BP: (!) 207/75 (!) 217/59 (!) 208/75 (!) 198/62  Pulse: 68 67 68 92  Resp:   18 20  Temp:  98.2 F (36.8 C) 98.4 F (36.9 C) 98.5 F (36.9 C)  TempSrc:  Oral Oral Oral  SpO2:  96% 97% 98%    Intake/Output Summary (Last 24 hours) at 12/04/2018  1415 Last data filed at 12/04/2018 0900 Gross per 24 hour  Intake 540 ml  Output 700 ml  Net -160 ml   There were no vitals filed for this visit.  Examination: General exam: Awake, laying in bed, in nad Respiratory system: Normal respiratory effort, no wheezing Cardiovascular system: regular rate, s1, s2 Gastrointestinal system: Soft, nondistended, positive BS Central nervous system: CN2-12 grossly intact, strength intact Extremities: Perfused, no clubbing Skin: Normal skin turgor, no notable skin lesions seen Psychiatry: Mood normal // no visual hallucinations   Data Reviewed: I have personally reviewed following labs and imaging studies  CBC: Recent Labs  Lab 11/30/18 1232 12/01/18 0633 12/02/18 0607 12/04/18 0342  WBC 12.7* 9.7 8.3 7.2  NEUTROABS 10.5*  --   --   --   HGB 13.6 11.3* 11.6* 12.0  HCT 41.9 32.9* 34.8* 37.5  MCV 92.9 90.6 92.6 92.4  PLT 174 147* 143* [id number]   Basic Metabolic Panel: Recent Labs  Lab 11/30/18 1232 12/01/18 0633 12/02/18 0607 12/03/18 0525 12/04/18 0342  NA 141 145 143 141 141  K 3.3* 3.4* 4.3 4.0 3.6  CL 107 112* 113* 110 109  CO2 22 25 22 22 23   GLUCOSE 145* 100* 120* 116* 120*  BUN 26* 25* 19 14 13   CREATININE 0.87 0.72 0.71 0.74 0.75  CALCIUM 9.9 8.8* 9.0 9.4 9.3   GFR: CrCl cannot be calculated (Unknown ideal weight.). Liver Function Tests: Recent Labs  Lab 11/30/18 1232 12/01/18 [id number] 12/02/18 0607 12/04/18 0342  AST 154* 117* 92* 49*  ALT 49* 50* 54* 56*  ALKPHOS 75 56 54 61  BILITOT 2.0* 1.2 0.6 0.8  PROT 6.2* 4.7* 5.2* 5.6*  ALBUMIN 3.6 2.8* 3.0* 3.3*   No results for input(s): LIPASE, AMYLASE in the last 168 hours. No results for input(s): AMMONIA in the last 168 hours. Coagulation Profile: No results for input(s): INR, PROTIME in the last 168 hours. Cardiac Enzymes: Recent Labs  Lab 11/30/18 1232 12/01/18 0633 12/02/18 0607 12/03/18 0525 12/04/18 0342  CKTOTAL 6,972* 1,634* 654* 400* 228   BNP (last  3 results) No results for input(s): PROBNP in the last 8760 hours. HbA1C: No results for input(s): HGBA1C in the last 72 hours. CBG: Recent Labs  Lab 12/03/18 1123 12/03/18 1614 12/03/18 2106 12/04/18 0701 12/04/18 1128  GLUCAP 112* 107* 124* 114* 128*   Lipid Profile: No results for input(s): CHOL, HDL, LDLCALC, TRIG, CHOLHDL, LDLDIRECT in the last 72 hours. Thyroid Function Tests: No results for input(s): TSH, T4TOTAL, FREET4, T3FREE, THYROIDAB in the last 72 hours. Anemia Panel: No results for input(s): VITAMINB12, FOLATE, FERRITIN, TIBC, IRON, RETICCTPCT in the last 72 hours. Sepsis Labs: No results for input(s): PROCALCITON, LATICACIDVEN in the last 168 hours.  Recent Results (from the past 240 hour(s))  SARS CORONAVIRUS 2 (TAT 6-24 HRS) Nasopharyngeal Nasopharyngeal Swab     Status: None   Collection Time: 11/30/18  4:05 PM   Specimen: Nasopharyngeal Swab  Result Value Ref Range Status   SARS Coronavirus 2 NEGATIVE NEGATIVE Final    Comment: (NOTE) SARS-CoV-2 target nucleic acids are NOT DETECTED. The SARS-CoV-2 RNA is  generally detectable in upper and lower respiratory specimens during the acute phase of infection. Negative results do not preclude SARS-CoV-2 infection, do not rule out co-infections with other pathogens, and should not be used as the sole basis for treatment or other patient management decisions. Negative results must be combined with clinical observations, patient history, and epidemiological information. The expected result is Negative. Fact Sheet for Patients: SugarRoll.be Fact Sheet for Healthcare Providers: https://www.woods-mathews.com/ This test is not yet approved or cleared by the [place] FDA and  has been authorized for detection and/or diagnosis of SARS-CoV-2 by FDA under an Emergency Use Authorization (EUA). This EUA will remain  in effect (meaning this test can be used) for the duration of  the COVID-19 declaration under Section 56 4(b)(1) of the Act, 21 U.S.C. section 360bbb-3(b)(1), unless the authorization is terminated or revoked sooner. Performed at [facility], [place] [address], [place], [place] [address]   Urine Culture     Status: Abnormal (Preliminary result)   Collection Time: 11/30/18  8:29 PM   Specimen: Urine, Random  Result Value Ref Range Status   Specimen Description URINE, RANDOM  Final   Special Requests   Final    NONE Performed at [facility], [place] [address], [place], [place] [address]    Culture (A)  Final    >=100,000 COLONIES/mL PROTEUS MIRABILIS 40,000 COLONIES/mL PSEUDOMONAS AERUGINOSA    Report Status PENDING  Incomplete   Organism ID, Bacteria PROTEUS MIRABILIS (A)  Final      Susceptibility   Proteus mirabilis - MIC*    AMPICILLIN <=2 SENSITIVE Sensitive     CEFAZOLIN <=4 SENSITIVE Sensitive     CEFTRIAXONE <=1 SENSITIVE Sensitive     CIPROFLOXACIN 1 SENSITIVE Sensitive     GENTAMICIN >=16 RESISTANT Resistant     IMIPENEM 2 SENSITIVE Sensitive     NITROFURANTOIN 128 RESISTANT Resistant     TRIMETH/SULFA >=320 RESISTANT Resistant     AMPICILLIN/SULBACTAM <=2 SENSITIVE Sensitive     PIP/TAZO <=4 SENSITIVE Sensitive     * >=100,000 COLONIES/mL PROTEUS MIRABILIS     Radiology Studies: No results found.  Scheduled Meds: . amLODipine  2.5 mg Oral Daily  . enoxaparin (LOVENOX) injection  40 mg Subcutaneous Q24H  . insulin aspart  0-9 Units Subcutaneous TID WC  . lisinopril  5 mg Oral Daily  . mupirocin cream   Topical Daily  . sodium chloride flush  3 mL Intravenous Q12H   Continuous Infusions: . cefTRIAXone (ROCEPHIN)  IV 1 g (12/03/18 1715)     LOS: 3 days   Marylu Lund, MD [facility] Pager On Amion  If 7PM-7AM, please contact night-coverage 12/04/2018, 2:15 PM

## 2018-12-04 NOTE — Progress Notes (Signed)
Physical Therapy Treatment Patient Details Name: Kristina Wilkinson MRN: [id number] DOB: 04/27/1927 Today's Date: 12/04/2018    History of Present Illness Pt is an 85 y/o female who presents s/p mechanical fall at home (tripped over a rug in the bathroom), and being down over 12+ hours. She presents to the ED after son found her at home with rhabdomyolitis. PMH significant for HTN, intracranial aneurysm s/p clipping, DM type II, chronic low back pain, peripheral neuropathy, and arthritis.    PT Comments    Pt was seen for exercise and transfer training, and notably cannot clear hips from the chair without assistance.  Pt is requiring cues for hand placement on chair arms, and will not push through feet first to initiate standing today.  Having not seen her prior, will anticipate that this is something she needs to practice due to having come from home to hospital.  See acutely for these needs and to progress her tolerance for standing with appropriate AD.   Follow Up Recommendations  SNF     Equipment Recommendations  Rolling walker with 5" wheels    Recommendations for Other Services       Precautions / Restrictions Precautions Precautions: Fall Precaution Comments: very weak to push off sit to stand, slow to follow sequence with use of legs Restrictions Weight Bearing Restrictions: No    Mobility  Bed Mobility               General bed mobility comments: up in chair when PT arrived  Transfers Overall transfer level: Needs assistance Equipment used: Rolling walker (2 wheeled) Transfers: Sit to/from Stand Sit to Stand: Mod assist Stand pivot transfers: Min assist       General transfer comment: mod to power up then min to pivot to [facility]  Ambulation/Gait             General Gait Details: declined to do this with PT today   Stairs             Wheelchair Mobility    Modified Rankin (Stroke Patients Only)       Balance Overall balance assessment:  History of Falls Sitting-balance support: Feet supported Sitting balance-Leahy Scale: Fair     Standing balance support: Bilateral upper extremity supported;During functional activity Standing balance-Leahy Scale: Poor Standing balance comment: dense cues for sequence and safety chair to [facility] anc back                            Cognition Arousal/Alertness: Awake/alert Behavior During Therapy: WFL for tasks assessed/performed Overall Cognitive Status: Within Functional Limits for tasks assessed                                 General Comments: requires repeating of cues for sequencing transfers      Exercises General Exercises - Lower Extremity Ankle Circles/Pumps: AROM;Both;5 reps Gluteal Sets: Strengthening;10 reps Long Arc Quad: Strengthening;Both;10 reps Heel Slides: Strengthening;Both;10 reps Hip ABduction/ADduction: Strengthening;Both;10 reps Hip Flexion/Marching: AROM;10 reps    General Comments General comments (skin integrity, edema, etc.): Pt is up to walk with walker only to transfer, slow to follow commands and talked a great deal about her neurosurgery      Pertinent Vitals/Pain Pain Assessment: Faces Faces Pain Scale: Hurts little more Pain Location: soreness of back Pain Intervention(s): Limited activity within patient's tolerance;Monitored during session;Premedicated before session;Repositioned  Home Living                      Prior Function            PT Goals (current goals can now be found in the care plan section) Acute Rehab PT Goals Patient Stated Goal: go home for rehab, avoid a fall    Frequency    Min 3X/week      PT Plan Current plan remains appropriate    Co-evaluation              AM-PAC PT "6 Clicks" Mobility   Outcome Measure  Help needed turning from your back to your side while in a flat bed without using bedrails?: A Little Help needed moving from lying on your back to sitting on  the side of a flat bed without using bedrails?: A Little Help needed moving to and from a bed to a chair (including a wheelchair)?: A Lot Help needed standing up from a chair using your arms (e.g., wheelchair or bedside chair)?: A Lot Help needed to walk in hospital room?: A Little Help needed climbing 3-5 steps with a railing? : Total 6 Click Score: 14    End of Session Equipment Utilized During Treatment: Gait belt Activity Tolerance: Patient limited by fatigue Patient left: in chair;with call bell/phone within reach;with chair alarm set Nurse Communication: Mobility status PT Visit Diagnosis: Unsteadiness on feet (R26.81);Pain;Muscle weakness (generalized) (M62.81);History of falling (Z91.81) Pain - Right/Left: (back) Pain - part of body: (back)     Time: [id number] PT Time Calculation (min) (ACUTE ONLY): 28 min  Charges:  $Therapeutic Exercise: 8-22 mins $Therapeutic Activity: 8-22 mins                    Ramond Dial 12/04/2018, 3:46 PM   Mee Hives, PT MS Acute Rehab Dept. Number: [place]

## 2018-12-04 NOTE — Progress Notes (Signed)
Occupational Therapy Treatment Patient Details Name: Kristina Wilkinson MRN: [id number] DOB: 1927/08/18 Today's Date: 12/04/2018    History of present illness Pt is an 85 y/o female who presents s/p mechanical fall at home (tripped over a rug in the bathroom), and being down over 12+ hours. She presents to the ED after son found her at home with rhabdomyolitis. PMH significant for HTN, intracranial aneurysm s/p clipping, DM type II, chronic low back pain, peripheral neuropathy, and arthritis.   OT comments  Pt asleep upon OT arrival; fairly easy to arouse but not wanting to participate much in therapy. Assisted pt to [facility] with RW. Pt requires MIN A for stand pivot transfer from recliner>BSC, cues throughout for hand placement and safety. Pt complete anterior pericare with set- up assist utilizing lateral leans. BP checked after stand pivot transfer 210/64- RN aware. Pt still insistent on [place] home with [facility] services. Feel pt would require 24 hour assist if insistent on going home. Currently pt more appropriate for SNF level therapies based on current level of function and since pt lives alone.  Will continue to follow per POC for OT needs.   Follow Up Recommendations  SNF;Home health OT;Supervision/Assistance - 24 hour;Other (comment)(pt refusing SNF)    Equipment Recommendations  3 in 1 bedside commode    Recommendations for Other Services      Precautions / Restrictions Precautions Precautions: Fall Precaution Comments: very weak to push off sit to stand, slow to follow sequence with use of legs Restrictions Weight Bearing Restrictions: No       Mobility Bed Mobility               General bed mobility comments: OOB in chair  Transfers Overall transfer level: Needs assistance Equipment used: Rolling walker (2 wheeled) Transfers: Sit to/from [facility] Sit to Stand: Min assist Stand pivot transfers: Min assist       General transfer comment: MIN A to power  up= pt requesting therapist place foot in front of her feet to stabilize; Cues for hand placement and safety throughout transfer    Balance Overall balance assessment: History of Falls;Needs assistance Sitting-balance support: Feet supported Sitting balance-Leahy Scale: Fair     Standing balance support: Bilateral upper extremity supported;During functional activity Standing balance-Leahy Scale: Poor Standing balance comment: reliant on BUE support                           ADL either performed or assessed with clinical judgement   ADL Overall ADL's : Needs assistance/impaired     Grooming: Wash/dry hands;Sitting;Set up                   Toilet Transfer: RW;Stand-pivot;Minimal assistance;BSC Toilet Transfer Details (indicate cue type and reason): [place]  for stand pivot transfer from recliner;BSC; cues for hand placement  and safety Toileting- Clothing Manipulation and Hygiene: Supervision/safety;Set up;Sitting/lateral lean       Functional mobility during ADLs: Minimal assistance;Rolling walker;Cueing for safety General ADL Comments: MIN for toilet transfer; set- up for toileting hygiene using lateral leans     Vision Baseline Vision/History: Wears glasses Wears Glasses: Reading only     Perception     Praxis      Cognition Arousal/Alertness: Awake/alert Behavior During Therapy: WFL for tasks assessed/performed Overall Cognitive Status: Within Functional Limits for tasks assessed  General Comments: slightly hard of hearing requiring continued cues to transition movements        Exercises Exercises: General Lower Extremity General Exercises - Lower Extremity Ankle Circles/Pumps: AROM;Both;5 reps Gluteal Sets: Strengthening;10 reps Long Arc Quad: Strengthening;Both;10 reps Heel Slides: Strengthening;Both;10 reps Hip ABduction/ADduction: Strengthening;Both;10 reps Hip Flexion/Marching: AROM;10 reps    Shoulder Instructions       General Comments BP checked after stand pivot transfer from recliner<>BSC 210/64- RN aware    Pertinent Vitals/ Pain       Pain Assessment: No/denies pain Faces Pain Scale: Hurts little more Pain Location: soreness of back Pain Intervention(s): Limited activity within patient's tolerance;Monitored during session;Premedicated before session;Repositioned  Home Living                                          Prior Functioning/Environment              Frequency  Min 3X/week        Progress Toward Goals  OT Goals(current goals can now be found in the care plan section)  Progress towards OT goals: Progressing toward goals  Acute Rehab OT Goals Patient Stated Goal: go home for rehab, avoid a fall OT Goal Formulation: With patient Time For Goal Achievement: 12/16/18 Potential to Achieve Goals: Good  Plan Discharge plan remains appropriate    Co-evaluation                 AM-PAC OT "6 Clicks" Daily Activity     Outcome Measure   Help from another person eating meals?: None Help from another person taking care of personal grooming?: A Little Help from another person toileting, which includes using toliet, bedpan, or urinal?: A Lot Help from another person bathing (including washing, rinsing, drying)?: A Lot Help from another person to put on and taking off regular upper body clothing?: A Little Help from another person to put on and taking off regular lower body clothing?: A Lot 6 Click Score: 16    End of Session Equipment Utilized During Treatment: Rolling walker  OT Visit Diagnosis: Unsteadiness on feet (R26.81);Other abnormalities of gait and mobility (R26.89);History of falling (Z91.81);Pain   Activity Tolerance Patient tolerated treatment well   Patient Left in chair;with call bell/phone within reach;with chair alarm set   Nurse Communication Mobility status;Other (comment)(BP 210/64 at end of session)         Time: [id number] OT Time Calculation (min): 22 min  Charges: OT General Charges $OT Visit: 1 Visit OT Treatments $Self Care/Home Management : 8-22 mins  [place], [place] Acute Rehabilitation Services [phone] [place] 12/04/2018, 3:51 PM

## 2018-12-05 DIAGNOSIS — S90934D Unspecified superficial injury of right lesser toe(s), subsequent encounter: Secondary | ICD-10-CM | POA: Diagnosis not present

## 2018-12-05 DIAGNOSIS — I10 Essential (primary) hypertension: Secondary | ICD-10-CM | POA: Diagnosis not present

## 2018-12-05 DIAGNOSIS — S50901D Unspecified superficial injury of right elbow, subsequent encounter: Secondary | ICD-10-CM | POA: Diagnosis not present

## 2018-12-05 DIAGNOSIS — T796XXD Traumatic ischemia of muscle, subsequent encounter: Secondary | ICD-10-CM | POA: Diagnosis not present

## 2018-12-05 DIAGNOSIS — S90935D Unspecified superficial injury of left lesser toe(s), subsequent encounter: Secondary | ICD-10-CM | POA: Diagnosis not present

## 2018-12-05 DIAGNOSIS — S90932D Unspecified superficial injury of left great toe, subsequent encounter: Secondary | ICD-10-CM | POA: Diagnosis not present

## 2018-12-05 LAB — COMPREHENSIVE METABOLIC PANEL
ALT: 51 U/L — ABNORMAL HIGH (ref 0–44)
AST: 37 U/L (ref 15–41)
Albumin: 3.3 g/dL — ABNORMAL LOW (ref 3.5–5.0)
Alkaline Phosphatase: 62 U/L (ref 38–126)
Anion gap: 8 (ref 5–15)
BUN: 15 mg/dL (ref 8–23)
CO2: 24 mmol/L (ref 22–32)
Calcium: 9.5 mg/dL (ref 8.9–10.3)
Chloride: 108 mmol/L (ref 98–111)
Creatinine, Ser: 0.76 mg/dL (ref 0.44–1.00)
GFR calc Af Amer: >60 mL/min (ref >60)
GFR calc non Af Amer: >60 mL/min (ref >60)
Glucose, Bld: 127 mg/dL — ABNORMAL HIGH (ref 70–99)
Potassium: 3.8 mmol/L (ref 3.5–5.1)
Sodium: 140 mmol/L (ref 135–145)
Total Bilirubin: 0.4 mg/dL (ref 0.3–1.2)
Total Protein: 5.7 g/dL — ABNORMAL LOW (ref 6.5–8.1)

## 2018-12-05 LAB — GLUCOSE, CAPILLARY
Glucose-Capillary: 118 mg/dL — ABNORMAL HIGH (ref 70–99)
Glucose-Capillary: 140 mg/dL — ABNORMAL HIGH (ref 70–99)

## 2018-12-05 LAB — URINE CULTURE: Culture: >=100000 — AB

## 2018-12-05 LAB — CBC
HCT: 37.6 % (ref 36.0–46.0)
Hemoglobin: 12.3 g/dL (ref 12.0–15.0)
MCH: 30.1 pg (ref 26.0–34.0)
MCHC: 32.7 g/dL (ref 30.0–36.0)
MCV: 91.9 fL (ref 80.0–100.0)
Platelets: 185 10*3/uL (ref 150–400)
RBC: 4.09 MIL/uL (ref 3.87–5.11)
RDW: 13 % (ref 11.5–15.5)
WBC: 6.6 10*3/uL (ref 4.0–10.5)
nRBC: 0 % (ref 0.0–0.2)

## 2018-12-05 LAB — CK: Total CK: 147 U/L (ref 38–234)

## 2018-12-05 MED ORDER — LISINOPRIL 10 MG PO TABS: 10.0000 mg | ORAL_TABLET | Freq: Every day | ORAL | 0 refills | Status: DC

## 2018-12-05 MED ORDER — AMLODIPINE BESYLATE 5 MG PO TABS: 5.0000 mg | ORAL_TABLET | Freq: Every day | ORAL | 0 refills | Status: DC

## 2018-12-05 MED ORDER — ROPINIROLE HCL 0.25 MG PO TABS: 0.2500 mg | ORAL_TABLET | Freq: Every day | ORAL | 0 refills | Status: DC

## 2018-12-05 NOTE — TOC Transition Note (Signed)
Transition of Care [facility]) - CM/SW Discharge Note   Patient Details  Name: Kristina Wilkinson MRN: [id number] Date of Birth: [birth year]  Transition of Care [facility]) CM/SW Contact:  Bartholomew Crews, RN Phone Number: [phone] 12/05/2018, 10:44 AM   Clinical Narrative:    Spoke with patient at the bedside. She states a friend of hers will pick her up later today. Her friend has an appointment this morning and will call her when she gets home. Notified [facility] of transition home later today. If not able to see her today, will arrange for RN visit tomorrow. Patient aware. Patient stated she may not be able to get meds picked up, so request placed to MD to reroute medications to [place]. No further transition of care needs identified.    Final next level of care: [place] Barriers to Discharge: No Barriers Identified   Patient Goals and CMS Choice Patient states their goals for this hospitalization and ongoing recovery are:: I want to be able to walk better CMS Medicare.gov Compare Post Acute Care list provided to:: Patient Choice offered to / list presented to : Patient  Discharge Placement                       Discharge Plan and Services In-house Referral: Clinical Social Work, [facility] Discharge Planning Services: CM Consult Post Acute Care Choice: Home Health, Durable Medical Equipment          DME Arranged: Walker rolling   Date DME Agency Contacted: 12/01/18 Time DME Agency Contacted: [id number] Representative spoke with at DME Agency: Thedore Mins HH Arranged: RN, PT, OT, Nurse's Aide, Social Work [employer] Agency: [place] Date [place]: 12/05/18 Time [place]: 1015 Representative spoke with at [place]: [place] ([place]) Interventions     Readmission Risk Interventions No flowsheet data found.

## 2018-12-05 NOTE — TOC Transition Note (Signed)
Transition of Care [facility]) - CM/SW Discharge Note   Patient Details  Name: SAHNIYA HENNEMAN MRN: [id number] Date of Birth: [birth year]  Transition of Care [facility]) CM/SW Contact:  Bartholomew Crews, RN Phone Number: [phone] 12/05/2018, 12:55 PM   Clinical Narrative:    Spoke with patient at bedside discharge medications sent to [place] does not deliver to [facility]. Patient states that she will be able to pick up in the morning and can take her medicine in the morning and she is ok with not taking ropinorole tonight for her restless legs.   Discussed 3N1 with drop arm. Patient is agreement. DME order placed and referral sent to [facility] to deliver to bedside.   Patient is declining PTAR transport at this time.   Pending PT follow up to assess functional ability with stairs. Patient states that she thinks she can do it with her friend's son's assistance.    Final next level of care: [place] Barriers to Discharge: No Barriers Identified   Patient Goals and CMS Choice Patient states their goals for this hospitalization and ongoing recovery are:: I want to be able to walk better CMS Medicare.gov Compare Post Acute Care list provided to:: Patient Choice offered to / list presented to : Patient  Discharge Placement                       Discharge Plan and Services In-house Referral: Clinical Social Work, [facility] Discharge Planning Services: CM Consult Post Acute Care Choice: Home Health, Durable Medical Equipment          DME Arranged: Walker rolling   Date DME Agency Contacted: 12/01/18 Time DME Agency Contacted: [id number] Representative spoke with at DME Agency: Thedore Mins HH Arranged: RN, PT, OT, Nurse's Aide, Social Work [employer] Agency: [place] Date [place]: 12/05/18 Time [place]: 1015 Representative spoke with at [place]: [place] ([place] Interventions     Readmission Risk  Interventions No flowsheet data found.

## 2018-12-05 NOTE — Discharge Summary (Addendum)
Physician Discharge Summary  Kristina Wilkinson [id number] DOB: 03/15/1927 DOA: 11/30/2018  PCP: Elby Beck, FNP  Admit date: 11/30/2018 Discharge date: 12/05/2018  Admitted From: Home Disposition:  Home  Recommendations for Outpatient Follow-up:  1. Follow up with PCP in 1-2 weeks 2. Please monitor blood pressure very carefully and continue to titrate BP meds accordingly  Home Health:PT, OT, RN, Aide  Equipment/Devices:Rolling walker    Discharge Condition:Improved CODE STATUS:Full Diet recommendation: Diabetic   Brief/Interim Summary: 85 year old female with history of hypertension, history of intracranial aneurysm clipping in 1980s and 90s with mild chronic right lower extremity weakness, chronic low back pain, type 2 diabetes, peripheral neuropathy, arthritis reports tripping over a rug yesterday in front of her sink hit her head and fell down, was not able to get back up, was found by son the following day and brought to the emergency room, in the ED noted to have elevated CK, abnormal urinalysis with positive nitrites, no acute fractures noted on imaging studies  Discharge Diagnoses:  Principal Problem:   Traumatic rhabdomyolysis ([place]) Active Problems:   Diabetes mellitus without complication ([place])   Transaminitis   Abnormal urinalysis   Leukocytosis   Hypokalemia   Open toe wound  Traumatic rhabdomyolysissecondary to fall:  -Acute.Patient presentedafter tripping over a rug at home.  -She was unable to getupfor over 12 hours.  -Imaging studies did not show any acute fractures.  -Presenting CK of 7000, trended down to 400 with IVF hydration -Renal function remains stable at this time -Therapy recommendations for SNF, however pt adamantly refused. Therapy later recommends [facility] as pt refused SNF. Patient to return home with home health services  Leukocytosis:  -Acute.  -WBC 12.7 on admission -Normalized  Cellulitis/ left great toe wound:  -Acute.   -Patient with redness and erythema around skin abrasions related with fall.Also with wounds to left great toe and second toe due to dropping a can on toe last week. Xray without fx. Evaluated by wound care who recommend topical treatment. Patient is afebrile and non-toxic appearing -Completed course of rocephin  Proteus UTI: -Urinalysis positive for hemoglobin and nitrite positive. -Urine culture positive for proteus mirabilis -Completed course of rocephin  Prolonged QT interval: Acute. Initial QTc 503 on admission -Remains stable at this time  Hypokalemia:  -Normalized  Transaminitis:  -Acute.AST and ALT improving -Likely secondary to rhabdomyolysis.  Hypertensive urgency: -remains very poorly controlled -See prior note. Patient has insight to care -Patient is now agreeing to trial of low dose anti-hypertensive meds, tolerating norvasc and lisinopril -Would continue to titrate BP meds as tolerated  Diabetes mellitus type 2:  -Last hemoglobin A1c noted to be 6.4 on 04/2017.  -Patient not on any diabetic medications prior to admit. -Cont to check CBGs before every meal with sensitive SSI while in hospital  History of aneurysms/pclipping -Seems stable at this time  Discharge Instructions  Discharge Instructions    Diet - low sodium heart healthy   Complete by: As directed    Increase activity slowly   Complete by: As directed      Allergies as of 12/05/2018      Reactions   Gabapentin Other (See Comments)   Made the legs "go numb"   Lyrica [pregabalin] Other (See Comments)   "Made me pass out"   Other Other (See Comments)   "MD mixed a blood pressure medication with the Clinoril I was taking and I couldn't walk"   Penicillins Other (See Comments)   Has patient had a  PCN reaction causing immediate rash, facial/tongue/throat swelling, SOB or lightheadedness with hypotension: Unk- exact reaction not recalled, but is allergic Has patient had a PCN  reaction causing severe rash involving mucus membranes or skin necrosis: Unk Has patient had a PCN reaction that required hospitalization: Unk Has patient had a PCN reaction occurring within the last 10 years: Yes If all of the above answers are "NO", then may proceed with Cephalosporin use.   Vit D-vit E-safflower Oil Other (See Comments)   "Made me achy and sick"      Medication List    TAKE these medications   amLODipine 5 MG tablet Commonly known as: NORVASC Take 1 tablet (5 mg total) by mouth daily. Start taking on: December 06, 2018   ciprofloxacin 500 MG tablet Commonly known as: Cipro Take 1 tablet (500 mg total) by mouth 2 (two) times daily for 10 days.   lisinopril 10 MG tablet Commonly known as: ZESTRIL Take 1 tablet (10 mg total) by mouth daily. Start taking on: December 06, 2018   rOPINIRole 0.25 MG tablet Commonly known as: Requip Take 1 tablet (0.25 mg total) by mouth at bedtime.            Durable Medical Equipment  (From admission, onward)         Start     Ordered   12/02/18 0900  For home use only DME Walker rolling  Once    Question:  Patient needs a walker to treat with the following condition  Answer:  Weakness   12/02/18 0859   12/02/18 0810  For home use only DME Walker rolling  Once    Question:  Patient needs a walker to treat with the following condition  Answer:  Weakness generalized   12/02/18 0809         Follow-up Information    [facility] Follow up.   Specialty: Home Health Services Contact information: [place] [address] [phone]          Allergies  Allergen Reactions  . Gabapentin Other (See Comments)    Made the legs "go numb"  . Lyrica [Pregabalin] Other (See Comments)    "Made me pass out"  . Other Other (See Comments)    "MD mixed a blood pressure medication with the Clinoril I was taking and I couldn't walk"  . Penicillins Other (See Comments)    Has patient had a  PCN reaction causing immediate rash, facial/tongue/throat swelling, SOB or lightheadedness with hypotension: Unk- exact reaction not recalled, but is allergic Has patient had a PCN reaction causing severe rash involving mucus membranes or skin necrosis: Unk Has patient had a PCN reaction that required hospitalization: Unk Has patient had a PCN reaction occurring within the last 10 years: Yes If all of the above answers are "NO", then may proceed with Cephalosporin use.   Jearl Klinefelter D-Vit E-Safflower Oil Other (See Comments)    "Made me achy and sick"     Procedures/Studies: Dg Chest 2 View  Result Date: 11/30/2018 CLINICAL DATA:  Unwitnessed fall. EXAM: CHEST - 2 VIEW COMPARISON:  09/11/2012 chest radiograph FINDINGS: This is a low volume film with mild bibasilar atelectasis. There is no evidence of focal airspace disease, pulmonary edema, suspicious pulmonary nodule/mass, pleural effusion, or pneumothorax. No acute bony abnormalities are identified. IMPRESSION: Low volume film with mild bibasilar atelectasis. Electronically Signed   By: Margarette Canada M.D.   On: 11/30/2018 14:17   Dg Lumbar  Spine Complete  Result Date: 11/30/2018 CLINICAL DATA:  Acute low back pain following fall. Initial encounter. EXAM: LUMBAR SPINE - COMPLETE 4+ VIEW COMPARISON:  None. FINDINGS: No acute fracture or subluxation. Moderate multilevel degenerative disc disease and facet arthropathy again noted. No focal bony lesions are identified. Aortic atherosclerotic calcifications are noted. IMPRESSION: 1. No evidence of acute bony abnormality. 2. Moderate multilevel degenerative disc disease and facet arthropathy. 3.  Aortic Atherosclerosis (ICD10-I70.0). Electronically Signed   By: Margarette Canada M.D.   On: 11/30/2018 14:10   Dg Pelvis 1-2 Views  Result Date: 11/30/2018 CLINICAL DATA:  Acute pelvic pain following fall. Initial encounter. EXAM: PELVIS - 1-2 VIEW COMPARISON:  None. FINDINGS: There is no evidence of pelvic  fracture or diastasis. No pelvic bone lesions are seen. IMPRESSION: Negative. Electronically Signed   By: Margarette Canada M.D.   On: 11/30/2018 14:07   Dg Shoulder Right  Result Date: 11/30/2018 CLINICAL DATA:  Acute RIGHT shoulder pain following fall. Initial encounter. EXAM: RIGHT SHOULDER - 2+ VIEW COMPARISON:  None. FINDINGS: No acute fracture, subluxation or dislocation. Degenerative changes at the glenohumeral and AC joints noted. No focal bony lesions are present. IMPRESSION: No evidence of acute bony abnormality. Electronically Signed   By: Margarette Canada M.D.   On: 11/30/2018 14:03   Ct Head Wo Contrast  Result Date: 11/30/2018 CLINICAL DATA:  Fall, headache EXAM: CT HEAD WITHOUT CONTRAST CT CERVICAL SPINE WITHOUT CONTRAST TECHNIQUE: Multidetector CT imaging of the head and cervical spine was performed following the standard protocol without intravenous contrast. Multiplanar CT image reconstructions of the cervical spine were also generated. COMPARISON:  CT head dated 07/13/2016 FINDINGS: CT HEAD FINDINGS Brain: No evidence of acute infarction, hemorrhage, hydrocephalus, extra-axial collection or mass lesion/mass effect. Global cortical and central atrophy. Secondary ventricular prominence. Subcortical white matter and periventricular small vessel ischemic changes. Vascular: Intracranial atherosclerosis. Left vertebral artery aneurysm clips. Skull: Prior suboccipital craniectomy. Negative for fracture focal lesion. Sinuses/Orbits: Partial opacification of the right maxillary sinus. Visualized paranasal sinuses mastoid air cells are otherwise clear. Other: None. CT CERVICAL SPINE FINDINGS Alignment: Normal cervical lordosis. Skull base and vertebrae: No acute fracture. No primary bone lesion or focal pathologic process. Soft tissues and spinal canal: No prevertebral fluid or swelling. No visible canal hematoma. Disc levels: Moderate multilevel degenerative changes. Spinal canal is patent. Upper chest:  Visualized lung apices are notable for right apical pleural-parenchymal scarring. Other: Visualized thyroid is notable for a 13 mm right thyroid nodule. IMPRESSION: No evidence of acute intracranial abnormality. Atrophy with small vessel ischemic changes. Prior suboccipital craniectomy and postprocedural changes related to prior left vertebral artery aneurysm clipping. No evidence medic injury to cervical spine. Moderate multilevel degenerative changes. Electronically Signed   By: Julian Hy M.D.   On: 11/30/2018 14:34   Ct Cervical Spine Wo Contrast  Result Date: 11/30/2018 CLINICAL DATA:  Fall, headache EXAM: CT HEAD WITHOUT CONTRAST CT CERVICAL SPINE WITHOUT CONTRAST TECHNIQUE: Multidetector CT imaging of the head and cervical spine was performed following the standard protocol without intravenous contrast. Multiplanar CT image reconstructions of the cervical spine were also generated. COMPARISON:  CT head dated 07/13/2016 FINDINGS: CT HEAD FINDINGS Brain: No evidence of acute infarction, hemorrhage, hydrocephalus, extra-axial collection or mass lesion/mass effect. Global cortical and central atrophy. Secondary ventricular prominence. Subcortical white matter and periventricular small vessel ischemic changes. Vascular: Intracranial atherosclerosis. Left vertebral artery aneurysm clips. Skull: Prior suboccipital craniectomy. Negative for fracture focal lesion. Sinuses/Orbits: Partial opacification of the right maxillary  sinus. Visualized paranasal sinuses mastoid air cells are otherwise clear. Other: None. CT CERVICAL SPINE FINDINGS Alignment: Normal cervical lordosis. Skull base and vertebrae: No acute fracture. No primary bone lesion or focal pathologic process. Soft tissues and spinal canal: No prevertebral fluid or swelling. No visible canal hematoma. Disc levels: Moderate multilevel degenerative changes. Spinal canal is patent. Upper chest: Visualized lung apices are notable for right apical  pleural-parenchymal scarring. Other: Visualized thyroid is notable for a 13 mm right thyroid nodule. IMPRESSION: No evidence of acute intracranial abnormality. Atrophy with small vessel ischemic changes. Prior suboccipital craniectomy and postprocedural changes related to prior left vertebral artery aneurysm clipping. No evidence medic injury to cervical spine. Moderate multilevel degenerative changes. Electronically Signed   By: Julian Hy M.D.   On: 11/30/2018 14:34   Dg Shoulder Left  Result Date: 11/30/2018 CLINICAL DATA:  Acute LEFT shoulder pain following fall. Initial encounter. EXAM: LEFT SHOULDER - 2+ VIEW COMPARISON:  None. FINDINGS: No acute fracture, subluxation or dislocation. Degenerative changes at the glenohumeral and AC joints noted. No focal bony lesions are present. IMPRESSION: No acute abnormality. Electronically Signed   By: Margarette Canada M.D.   On: 11/30/2018 14:16   Dg Knee Complete 4 Views Left  Result Date: 11/30/2018 CLINICAL DATA:  Acute LEFT knee pain following fall. Initial encounter. EXAM: LEFT KNEE - COMPLETE 4+ VIEW COMPARISON:  12/19/2017 radiographs FINDINGS: MEDIAL compartment hemiarthroplasty again noted. No acute fracture or dislocation noted. No definite knee effusion noted. Moderate to severe degenerative changes in the LATERAL and patellofemoral compartment again identified. IMPRESSION: 1. No evidence of acute abnormality. Electronically Signed   By: Margarette Canada M.D.   On: 11/30/2018 14:06   Dg Knee Complete 4 Views Right  Result Date: 11/30/2018 CLINICAL DATA:  Acute RIGHT knee pain following fall. Initial encounter. EXAM: RIGHT KNEE - COMPLETE 4+ VIEW COMPARISON:  None. FINDINGS: RIGHT total knee arthroplasty noted. No evidence acute fracture or dislocation. No joint effusion noted. No bony lesions are present. IMPRESSION: 1. No evidence of acute abnormality. Electronically Signed   By: Margarette Canada M.D.   On: 11/30/2018 14:15   Dg Foot Complete  Left  Result Date: 11/30/2018 CLINICAL DATA:  Acute LEFT foot pain following fall. Initial encounter. EXAM: LEFT FOOT - COMPLETE 3+ VIEW COMPARISON:  None. FINDINGS: No acute fracture, subluxation or dislocation. Mild degenerative changes at the 1st MTP joint noted. No focal bony lesions are present. IMPRESSION: No evidence of acute bony abnormality. Electronically Signed   By: Margarette Canada M.D.   On: 11/30/2018 14:14   Dg Foot Complete Right  Result Date: 11/30/2018 CLINICAL DATA:  Acute RIGHT foot pain following fall. Initial encounter. EXAM: RIGHT FOOT COMPLETE - 3+ VIEW COMPARISON:  None. FINDINGS: No acute fracture, subluxation or dislocation. Mild hallux valgus and degenerative changes of 1st MTP joint noted. A small calcaneal spur is present. No soft tissue swelling noted. IMPRESSION: 1. No evidence of acute abnormality. Electronically Signed   By: Margarette Canada M.D.   On: 11/30/2018 14:12    Subjective: Very eager to go home  Discharge Exam: Vitals:   12/05/18 0529 12/05/18 0944  BP: (!) 182/64 137/63  Pulse: 69 75  Resp: 18 18  Temp: 98 F (36.7 C) 97.8 F (36.6 C)  SpO2: 98% 98%   Vitals:   12/04/18 1637 12/04/18 2043 12/05/18 0529 12/05/18 0944  BP: (!) 190/66 (!) 177/48 (!) 182/64 137/63  Pulse: 68 75 69 75  Resp:  16 18  18  Temp: 98.7 F (37.1 C) 97.8 F (36.6 C) 98 F (36.7 C) 97.8 F (36.6 C)  TempSrc: Oral Oral Oral Oral  SpO2: 97% 96% 98% 98%    General: Pt is alert, awake, not in acute distress Cardiovascular: RRR, S1/S2 +, no rubs, no gallops Respiratory: CTA bilaterally, no wheezing, no rhonchi Abdominal: Soft, NT, ND, bowel sounds + Extremities: no edema, no cyanosis   The results of significant diagnostics from this hospitalization (including imaging, microbiology, ancillary and laboratory) are listed below for reference.     Microbiology: Recent Results (from the past 240 hour(s))  SARS CORONAVIRUS 2 (TAT 6-24 HRS) Nasopharyngeal Nasopharyngeal  Swab     Status: None   Collection Time: 11/30/18  4:05 PM   Specimen: Nasopharyngeal Swab  Result Value Ref Range Status   SARS Coronavirus 2 NEGATIVE NEGATIVE Final    Comment: (NOTE) SARS-CoV-2 target nucleic acids are NOT DETECTED. The SARS-CoV-2 RNA is generally detectable in upper and lower respiratory specimens during the acute phase of infection. Negative results do not preclude SARS-CoV-2 infection, do not rule out co-infections with other pathogens, and should not be used as the sole basis for treatment or other patient management decisions. Negative results must be combined with clinical observations, patient history, and epidemiological information. The expected result is Negative. Fact Sheet for Patients: SugarRoll.be Fact Sheet for Healthcare Providers: https://www.woods-mathews.com/ This test is not yet approved or cleared by the [place] FDA and  has been authorized for detection and/or diagnosis of SARS-CoV-2 by FDA under an Emergency Use Authorization (EUA). This EUA will remain  in effect (meaning this test can be used) for the duration of the COVID-19 declaration under Section 56 4(b)(1) of the Act, 21 U.S.C. section 360bbb-3(b)(1), unless the authorization is terminated or revoked sooner. Performed at [facility], [place] [address] [place], [place] [address]   Urine Culture     Status: Abnormal   Collection Time: 11/30/18  8:29 PM   Specimen: Urine, Random  Result Value Ref Range Status   Specimen Description URINE, RANDOM  Final   Special Requests   Final    NONE Performed at [facility], [place] [address] [place],  [address]    Culture (A)  Final    >=100,000 COLONIES/mL PROTEUS MIRABILIS 40,000 COLONIES/mL PSEUDOMONAS AERUGINOSA    Report Status 12/05/2018 FINAL  Final   Organism ID, Bacteria PROTEUS MIRABILIS (A)  Final   Organism ID, Bacteria PSEUDOMONAS AERUGINOSA (A)  Final       Susceptibility   Pseudomonas aeruginosa - MIC*    CEFTAZIDIME 4 SENSITIVE Sensitive     CIPROFLOXACIN <=0.25 SENSITIVE Sensitive     GENTAMICIN <=1 SENSITIVE Sensitive     IMIPENEM 2 SENSITIVE Sensitive     PIP/TAZO 8 SENSITIVE Sensitive     CEFEPIME 2 SENSITIVE Sensitive     * 40,000 COLONIES/mL PSEUDOMONAS AERUGINOSA   Proteus mirabilis - MIC*    AMPICILLIN <=2 SENSITIVE Sensitive     CEFAZOLIN <=4 SENSITIVE Sensitive     CEFTRIAXONE <=1 SENSITIVE Sensitive     CIPROFLOXACIN 1 SENSITIVE Sensitive     GENTAMICIN >=16 RESISTANT Resistant     IMIPENEM 2 SENSITIVE Sensitive     NITROFURANTOIN 128 RESISTANT Resistant     TRIMETH/SULFA >=320 RESISTANT Resistant     AMPICILLIN/SULBACTAM <=2 SENSITIVE Sensitive     PIP/TAZO <=4 SENSITIVE Sensitive     * >=100,000 COLONIES/mL PROTEUS MIRABILIS     Labs: BNP (last 3  results) No results for input(s): BNP in the last 8760 hours. Basic Metabolic Panel: Recent Labs  Lab 12/01/18 0633 12/02/18 0607 12/03/18 0525 12/04/18 0342 12/05/18 0612  NA 145 143 141 141 140  K 3.4* 4.3 4.0 3.6 3.8  CL 112* 113* 110 109 108  CO2 25 22 22 23 24   GLUCOSE 100* 120* 116* 120* 127*  BUN 25* 19 14 13 15   CREATININE 0.72 0.71 0.74 0.75 0.76  CALCIUM 8.8* 9.0 9.4 9.3 9.5   Liver Function Tests: Recent Labs  Lab 11/30/18 1232 12/01/18 0633 12/02/18 0607 12/04/18 0342 12/05/18 0612  AST 154* 117* 92* 49* 37  ALT 49* 50* 54* 56* 51*  ALKPHOS 75 56 54 61 62  BILITOT 2.0* 1.2 0.6 0.8 0.4  PROT 6.2* 4.7* 5.2* 5.6* 5.7*  ALBUMIN 3.6 2.8* 3.0* 3.3* 3.3*   No results for input(s): LIPASE, AMYLASE in the last 168 hours. No results for input(s): AMMONIA in the last 168 hours. CBC: Recent Labs  Lab 11/30/18 1232 12/01/18 0633 12/02/18 0607 12/04/18 0342 12/05/18 0612  WBC 12.7* 9.7 8.3 7.2 6.6  NEUTROABS 10.5*  --   --   --   --   HGB 13.6 11.3* 11.6* 12.0 12.3  HCT 41.9 32.9* 34.8* 37.5 37.6  MCV 92.9 90.6 92.6 92.4 91.9  PLT 174 147*  143* 182 185   Cardiac Enzymes: Recent Labs  Lab 12/01/18 0633 12/02/18 0607 12/03/18 0525 12/04/18 0342 12/05/18 0612  CKTOTAL 1,634* 654* 400* 228 147   BNP: Invalid input(s): POCBNP CBG: Recent Labs  Lab 12/04/18 0701 12/04/18 1128 12/04/18 1659 12/04/18 2132 12/05/18 0649  GLUCAP 114* 128* 137* 120* 118*   D-Dimer No results for input(s): DDIMER in the last 72 hours. Hgb A1c No results for input(s): HGBA1C in the last 72 hours. Lipid Profile No results for input(s): CHOL, HDL, LDLCALC, TRIG, CHOLHDL, LDLDIRECT in the last 72 hours. Thyroid function studies No results for input(s): TSH, T4TOTAL, T3FREE, THYROIDAB in the last 72 hours.  Invalid input(s): FREET3 Anemia work up No results for input(s): VITAMINB12, FOLATE, FERRITIN, TIBC, IRON, RETICCTPCT in the last 72 hours. Urinalysis    Component Value Date/Time   COLORURINE YELLOW 11/30/2018 1553   APPEARANCEUR CLEAR 11/30/2018 1553   LABSPEC 1.023 11/30/2018 1553   PHURINE 5.0 11/30/2018 1553   GLUCOSEU 50 (A) 11/30/2018 1553   GLUCOSEU NEGATIVE 01/16/2011 0914   HGBUR LARGE (A) 11/30/2018 1553   BILIRUBINUR NEGATIVE 11/30/2018 1553   BILIRUBINUR neg 05/01/2017 1220   KETONESUR 20 (A) 11/30/2018 1553   PROTEINUR 100 (A) 11/30/2018 1553   UROBILINOGEN 0.2 05/01/2017 1220   UROBILINOGEN 0.2 04/14/2012 0930   NITRITE POSITIVE (A) 11/30/2018 1553   LEUKOCYTESUR NEGATIVE 11/30/2018 1553   Sepsis Labs Invalid input(s): PROCALCITONIN,  WBC,  LACTICIDVEN Microbiology Recent Results (from the past 240 hour(s))  SARS CORONAVIRUS 2 (TAT 6-24 HRS) Nasopharyngeal Nasopharyngeal Swab     Status: None   Collection Time: 11/30/18  4:05 PM   Specimen: Nasopharyngeal Swab  Result Value Ref Range Status   SARS Coronavirus 2 NEGATIVE NEGATIVE Final    Comment: (NOTE) SARS-CoV-2 target nucleic acids are NOT DETECTED. The SARS-CoV-2 RNA is generally detectable in upper and lower respiratory specimens during the acute  phase of infection. Negative results do not preclude SARS-CoV-2 infection, do not rule out co-infections with other pathogens, and should not be used as the sole basis for treatment or other patient management decisions. Negative results must be combined with clinical  observations, patient history, and epidemiological information. The expected result is Negative. Fact Sheet for Patients: SugarRoll.be Fact Sheet for Healthcare Providers: https://www.woods-mathews.com/ This test is not yet approved or cleared by the [place] FDA and  has been authorized for detection and/or diagnosis of SARS-CoV-2 by FDA under an Emergency Use Authorization (EUA). This EUA will remain  in effect (meaning this test can be used) for the duration of the COVID-19 declaration under Section 56 4(b)(1) of the Act, 21 U.S.C. section 360bbb-3(b)(1), unless the authorization is terminated or revoked sooner. Performed at [facility], [place] [address], [place], [place] [address]   Urine Culture     Status: Abnormal   Collection Time: 11/30/18  8:29 PM   Specimen: Urine, Random  Result Value Ref Range Status   Specimen Description URINE, RANDOM  Final   Special Requests   Final    NONE Performed at [facility], [employer] [address] [place], [place] [address]    Culture (A)  Final    >=100,000 COLONIES/mL PROTEUS MIRABILIS 40,000 COLONIES/mL PSEUDOMONAS AERUGINOSA    Report Status 12/05/2018 FINAL  Final   Organism ID, Bacteria PROTEUS MIRABILIS (A)  Final   Organism ID, Bacteria PSEUDOMONAS AERUGINOSA (A)  Final      Susceptibility   Pseudomonas aeruginosa - MIC*    CEFTAZIDIME 4 SENSITIVE Sensitive     CIPROFLOXACIN <=0.25 SENSITIVE Sensitive     GENTAMICIN <=1 SENSITIVE Sensitive     IMIPENEM 2 SENSITIVE Sensitive     PIP/TAZO 8 SENSITIVE Sensitive     CEFEPIME 2 SENSITIVE Sensitive     * 40,000 COLONIES/mL PSEUDOMONAS AERUGINOSA   Proteus  mirabilis - MIC*    AMPICILLIN <=2 SENSITIVE Sensitive     CEFAZOLIN <=4 SENSITIVE Sensitive     CEFTRIAXONE <=1 SENSITIVE Sensitive     CIPROFLOXACIN 1 SENSITIVE Sensitive     GENTAMICIN >=16 RESISTANT Resistant     IMIPENEM 2 SENSITIVE Sensitive     NITROFURANTOIN 128 RESISTANT Resistant     TRIMETH/SULFA >=320 RESISTANT Resistant     AMPICILLIN/SULBACTAM <=2 SENSITIVE Sensitive     PIP/TAZO <=4 SENSITIVE Sensitive     * >=100,000 COLONIES/mL PROTEUS MIRABILIS   Time spent: 30 min  SIGNED:   Marylu Lund, MD  [facility] 12/05/2018, 10:37 AM  If 7PM-7AM, please contact night-coverage

## 2018-12-05 NOTE — Progress Notes (Signed)
Occupational Therapy Treatment Patient Details Name: RAYLEA HOLTZAPPLE MRN: [id number] DOB: October 07, 1927 Today's Date: 12/05/2018    History of present illness Pt is an 85 y/o female who presents s/p mechanical fall at home (tripped over a rug in the bathroom), and being down over 12+ hours. She presents to the ED after son found her at home with rhabdomyolitis. PMH significant for HTN, intracranial aneurysm s/p clipping, DM type II, chronic low back pain, peripheral neuropathy, and arthritis.   OT comments  Pt set-up/ supervision for UB ADL; MOD A for LB ADL. Provided education on dressing RLE first as pt reports this is her weaker extremity. Pt requires MOD A to pull pants up to waist line upon sit>stand d/t unsteadiness on feet. Provided education of uses of 3n1; visually demo 'ed use of 3n1 as shower seat. Pt likely to DC home today; pt declined SNF level therapies; continue to recommend HHOT to assess home environment to reduce fall risk and maximize functional independence. Will continue to follow acutely per POC.   Follow Up Recommendations  SNF;Home health OT;Supervision/Assistance - 24 hour;Other (comment)(pt refusing SNF)    Equipment Recommendations  3 in 1 bedside commode    Recommendations for Other Services      Precautions / Restrictions Precautions Precautions: Fall Restrictions Weight Bearing Restrictions: No       Mobility Bed Mobility               General bed mobility comments: OOB in chair  Transfers Overall transfer level: Needs assistance Equipment used: Rolling walker (2 wheeled) Transfers: Sit to/from Stand Sit to Stand: Min assist         General transfer comment: MIN A to power up; pt requires assist to inital steady self upon stand; cues for hand placement and safety throughout transfer    Balance Overall balance assessment: History of Falls;Needs assistance Sitting-balance support: Feet supported Sitting balance-Leahy Scale: Fair Sitting  balance - Comments: able to don pants in recliner with close min guard   Standing balance support: Bilateral upper extremity supported;During functional activity Standing balance-Leahy Scale: Poor Standing balance comment: reliant on BUE support                           ADL either performed or assessed with clinical judgement   ADL Overall ADL's : Needs assistance/impaired                 Upper Body Dressing : Set up;Sitting;Supervision/safety   Lower Body Dressing: Moderate assistance;Sit to/from stand Lower Body Dressing Details (indicate cue type and reason): MOD A to don R leg; pt reports that is her weaker leg; education provided on dressing weaker leg first; assit needed tp pull pants up to waist line as pt unsteady with static standing             Functional mobility during ADLs: Minimal assistance;Rolling walker;Cueing for safety General ADL Comments: set- up/ supervision UB ADL; MOD A LB ADL; pt unsteady on feet during standing ADLs     Vision Baseline Vision/History: Wears glasses Wears Glasses: Reading only     Perception     Praxis      Cognition Arousal/Alertness: Awake/alert Behavior During Therapy: WFL for tasks assessed/performed Overall Cognitive Status: Within Functional Limits for tasks assessed  General Comments: slightly hard of hearing requiring continued cues to transition movements        Exercises     Shoulder Instructions       General Comments      Pertinent Vitals/ Pain       Pain Assessment: No/denies pain  Home Living                                          Prior Functioning/Environment              Frequency  Min 3X/week        Progress Toward Goals  OT Goals(current goals can now be found in the care plan section)  Progress towards OT goals: Progressing toward goals  Acute Rehab OT Goals Patient Stated Goal: go home for rehab,  avoid a fall OT Goal Formulation: With patient Time For Goal Achievement: 12/16/18 Potential to Achieve Goals: Good  Plan Discharge plan remains appropriate    Co-evaluation                 AM-PAC OT "6 Clicks" Daily Activity     Outcome Measure   Help from another person eating meals?: None Help from another person taking care of personal grooming?: A Little Help from another person toileting, which includes using toliet, bedpan, or urinal?: A Lot Help from another person bathing (including washing, rinsing, drying)?: A Lot Help from another person to put on and taking off regular upper body clothing?: A Little Help from another person to put on and taking off regular lower body clothing?: A Lot 6 Click Score: 16    End of Session Equipment Utilized During Treatment: Rolling walker  OT Visit Diagnosis: Unsteadiness on feet (R26.81);Other abnormalities of gait and mobility (R26.89);History of falling (Z91.81);Pain   Activity Tolerance Patient tolerated treatment well   Patient Left in chair;with call bell/phone within reach;with chair alarm set   Nurse Communication Mobility status;Other (comment)(pt dressed)        Time: [id number] OT Time Calculation (min): 16 min  Charges: OT General Charges $OT Visit: 1 Visit OT Treatments $Self Care/Home Management : 8-22 mins  [place], [place] [facility] [phone] [place] 12/05/2018, 11:23 AM

## 2018-12-05 NOTE — Progress Notes (Signed)
Chart reviewed.  Contacted attending MD prior to seeing patient.  PMT issue resolved.  Patient is now agreeable to take BP medications after conversations with attending MD and is being discharged.  Thank you for the consultation.  Please consider outpatient Palliative follow up if appropriate and please consult [place] again in the future if we can be of assistance.   Florentina Jenny, PA-C Palliative Medicine Pager: [phone]

## 2018-12-05 NOTE — Care Management Important Message (Signed)
Important Message  Patient Details  Name: MADILYNNE WINROW MRN: [id number] Date of Birth: 23-Feb-1927   Medicare Important Message Given:  Yes     Vernetta Dizdarevic 12/05/2018, 3:18 PM

## 2018-12-08 ENCOUNTER — Telehealth: Payer: Self-pay

## 2018-12-08 NOTE — Telephone Encounter (Signed)
Per discharge note, patient was set up with [facility] HH/PT/OT. Please call patient and find out if services have started.

## 2018-12-08 NOTE — Telephone Encounter (Signed)
SPoke with patient, states again that she does not want Kristina Wilkinson - she refuses.  Pt states that right now she does not have anyone that can bring her to the office to a visit in person, Pt does not have a way to do a virtual visit and states that she does not hear to well to talk on the phone. Pt states that someone is more than welcome to come to her home and complete this visit.   I asked her who Kristina Wilkinson is, whom Kristina Wilkinson spoke with earlier today and she states that that is her cleaning lady. Her previous friend/aid was "fired" d/t "not being in the right mind state".  Aware that given her having Medicare we have to make sure that follow steps appropriately regarding her care.   Will send to Kristina Wilkinson for further recommendations.

## 2018-12-08 NOTE — Telephone Encounter (Signed)
See other TE 12/08/2018

## 2018-12-08 NOTE — Telephone Encounter (Signed)
Please see patient concern section. Thank you  Transition Care Management Follow-up Telephone Call Caren Griffins, works for the patient, spoke with me and patient was present in the background answering questions.  Date discharged? 12/05/2018   How have you been since you were released from the hospital? Patient is doing ok per Caren Griffins. Wants to get started with therapy for her leg. Just changed bandage on her wounds so far, looks clean not infected.   Do you understand why you were in the hospital? yes   Do you understand the discharge instructions? yes   Where were you discharged to?  home   Items Reviewed:  Medications reviewed: yes  Allergies reviewed: yes  Dietary changes reviewed: yes  Referrals reviewed: patient would like Home PT with [facility].   Functional Questionnaire:   Activities of Daily Living (ADLs):   She states they are independent in the following: dressing, bathing, hygiene, toileting, fixing medications, grooming. States they require assistance with the following: fixing food, walker and a cane to ambulate.   Any transportation issues/concerns?: has a friend that takes patient where she needs to go and she drives.   Any patient concerns? Patient wanted to get PT set up with [facility] asap, wants to work on her leg.   Confirmed importance and date/time of follow-up visits scheduled No-patient states she can not make an appointment right now, I did advise patient that we need to get her scheduled soon because it has been over a year since her last appointment.  Confirmed with patient if condition begins to worsen call PCP or go to the ER.  Patient was given the office number and encouraged to call back with question or concerns.  : yes

## 2018-12-08 NOTE — Telephone Encounter (Signed)
Please call patient and tell her that I can not order home PT for her until I have a visit with her because I have not seen her in the office for over a year. Please schedule her for Wed.

## 2018-12-08 NOTE — Telephone Encounter (Signed)
Inez Catalina with [facility] HH left v/m that pt was admitted to [facility] on 12/05/18 but today pt is refusing all [place] services from [place].Inez Catalina isn ot sure if pt has any meds in the home. Anastasiya CMA said that pt wants [place] services through [facility].Please advise.

## 2018-12-08 NOTE — Telephone Encounter (Signed)
I am checking with referrals to see if they can give me a [facility] contact, per social work discharge note, she was to be discharged with [facility] and [facility].

## 2018-12-10 ENCOUNTER — Other Ambulatory Visit: Payer: Self-pay | Admitting: Family Medicine

## 2018-12-10 DIAGNOSIS — Z659 Problem related to unspecified psychosocial circumstances: Secondary | ICD-10-CM

## 2018-12-10 DIAGNOSIS — E119 Type 2 diabetes mellitus without complications: Secondary | ICD-10-CM

## 2018-12-10 DIAGNOSIS — R2681 Unsteadiness on feet: Secondary | ICD-10-CM

## 2018-12-10 DIAGNOSIS — E1165 Type 2 diabetes mellitus with hyperglycemia: Secondary | ICD-10-CM

## 2018-12-10 DIAGNOSIS — T796XXD Traumatic ischemia of muscle, subsequent encounter: Secondary | ICD-10-CM

## 2018-12-10 NOTE — Telephone Encounter (Signed)
Order placed for [facility]. Please call patient and check on her. Find out, again, if there is any way for her to be transported to the office for a follow up? Let her know that I have put in a referral for case management to help with transportation, home health coordination, etc.

## 2018-12-11 ENCOUNTER — Other Ambulatory Visit: Payer: Self-pay | Admitting: *Deleted

## 2018-12-11 NOTE — Patient Outreach (Signed)
[facility]) Care Management  12/11/2018  Kristina Wilkinson June 07, 1927 [id number]    Referral Date: 12/11/2018 Referral Source: MD office Referral Reason: patient with recent hospital discharge, low community support, needs coordination of care Insurance: Next Gen   Outreach attempt #1, unsuccessful.  Referral received from MD office as member was recently discharged from hospital after diagnosis of Traumatic rhabdomyolysis as a result of a fall.  Primary MD office will complete transition of care assessment.  Per chart, she also has history of HTN, hearing loss, diabetes, and chronic pain.  Call placed to member, no answer.  HIPAA compliant voice message left.  Plan: RN CM will send unsuccessful outreach letter and follow up within the next 3-4 business days.  [name], [place], MSN [place] [phone]

## 2018-12-12 NOTE — Telephone Encounter (Signed)
Per Jackelyn Poling, cannot prescribe anything since we have not seen her in over a year.  Can try Tylenol or Ibuprofen as tolerated, OTC Voltaren gel, lidocaine patches, Aspercreme topicals Apply Heat - be careful not to burn skin.    Per Jackelyn Poling, patient really needs to try and get in the office for an appt.   Spoke with patient, aware of recommendations. Pt is going to try and find a ride to get up here soon.  Pt states that she does already use Aspercreme and it seems to help a little.    Delorse Limber, thanks.

## 2018-12-12 NOTE — Telephone Encounter (Signed)
Pt having some pain in her side from where she fell.  Pt has not tried Tylenol or Ibuprofen -- pt states that she never takes these because she always "reacts" to them -- leg issues and other issues when she takes these. Requesting a prescription for something for the arthritis pain.   Pt denies using heat.   Pt aware of referrals. No able to get in the office for an appt - she still has no one to bring her.

## 2018-12-15 NOTE — Telephone Encounter (Signed)
Noted  

## 2018-12-17 ENCOUNTER — Other Ambulatory Visit: Payer: Self-pay | Admitting: *Deleted

## 2018-12-17 NOTE — Patient Outreach (Signed)
[facility]) Care Management  12/17/2018  Kristina Wilkinson [birth year] [id number]   Referral Date: 12/11/2018 Referral Source: MD office Referral Reason: patient with recent hospital discharge, low community support, needs coordination of care Insurance: Next Gen   Outreach attempt #2, unsuccessful.  Referral received from MD office as member was recently discharged from hospital after diagnosis of Traumatic rhabdomyolysis as a result of a fall.  Primary MD office will complete transition of care assessment.  Per chart, she also has history of HTN, hearing loss, diabetes, and chronic pain.  Call placed to member, no answer.  HIPAA compliant voice message left.  Will follow up within the next 3-4 business days.  [name], [place], MSN [place] [phone]

## 2018-12-23 ENCOUNTER — Other Ambulatory Visit: Payer: Self-pay | Admitting: *Deleted

## 2018-12-23 NOTE — Patient Outreach (Signed)
[facility]) Care Management  12/23/2018  Kristina Wilkinson 05-Mar-1927 [id number]   Referral Date:12/11/2018 Referral Source:MD office Referral Reason:patient with recent hospital discharge, low community support, needs coordination of care Insurance:Next Gen   Outreach attempt #3, successful.  Referral received from MD office as member was recently discharged from hospital after diagnosis ofTraumatic rhabdomyolysisas a result of a fall. Primary MD office will complete transition of care assessment. Per chart, she also has history of HTN, hearing loss, diabetes, and chronic pain.  Call placed to member, this time successful.  This care manager introduced self and stated purpose of call.  Attempted to verify member's identity, she refuses stating "what do you want?"  Attempted to review benefits, she report she is doing fine managing her care.  She has not scheduled follow up appointment, state she will call when she she has improved enough to go.  Advised that she could request a virtual/televisit, again stated she does not want to schedule at this time, will reschedule when she feels she is up to it.  Attempted again to engage member, she declines.  Will close case at this time.  [name], [place], MSN [place] [phone]

## 2019-01-05 ENCOUNTER — Telehealth: Payer: Self-pay | Admitting: Family Medicine

## 2019-01-05 NOTE — Telephone Encounter (Signed)
I received multiple requests for gout a home health orders to be signed.  I spoke with Bettina Gavia at [facility] and explained to her that the patient has not been seen in the office to certify home health and that it was ordered while she was in the hospital.

## 2019-03-05 ENCOUNTER — Emergency Department (HOSPITAL_COMMUNITY): Payer: Medicare Other

## 2019-03-05 ENCOUNTER — Encounter (HOSPITAL_COMMUNITY): Payer: Self-pay

## 2019-03-05 ENCOUNTER — Inpatient Hospital Stay (HOSPITAL_COMMUNITY)
Admission: EM | Admit: 2019-03-05 | Discharge: 2019-03-10 | DRG: 871 | Disposition: A | Discharge status: Skilled Nursing Facility | Payer: Medicare Other | Attending: Internal Medicine | Admitting: Internal Medicine

## 2019-03-05 ENCOUNTER — Other Ambulatory Visit: Payer: Self-pay

## 2019-03-05 DIAGNOSIS — B957 Other staphylococcus as the cause of diseases classified elsewhere: Secondary | ICD-10-CM | POA: Diagnosis present

## 2019-03-05 DIAGNOSIS — I10 Essential (primary) hypertension: Secondary | ICD-10-CM | POA: Diagnosis present

## 2019-03-05 DIAGNOSIS — M810 Age-related osteoporosis without current pathological fracture: Secondary | ICD-10-CM | POA: Diagnosis not present

## 2019-03-05 DIAGNOSIS — S79911A Unspecified injury of right hip, initial encounter: Secondary | ICD-10-CM | POA: Diagnosis not present

## 2019-03-05 DIAGNOSIS — Z823 Family history of stroke: Secondary | ICD-10-CM

## 2019-03-05 DIAGNOSIS — S199XXA Unspecified injury of neck, initial encounter: Secondary | ICD-10-CM | POA: Diagnosis not present

## 2019-03-05 DIAGNOSIS — Z888 Allergy status to other drugs, medicaments and biological substances status: Secondary | ICD-10-CM

## 2019-03-05 DIAGNOSIS — Z9851 Tubal ligation status: Secondary | ICD-10-CM

## 2019-03-05 DIAGNOSIS — M21951 Unspecified acquired deformity of right thigh: Secondary | ICD-10-CM | POA: Diagnosis present

## 2019-03-05 DIAGNOSIS — N390 Urinary tract infection, site not specified: Secondary | ICD-10-CM | POA: Diagnosis not present

## 2019-03-05 DIAGNOSIS — Z9181 History of falling: Secondary | ICD-10-CM

## 2019-03-05 DIAGNOSIS — W19XXXA Unspecified fall, initial encounter: Secondary | ICD-10-CM | POA: Diagnosis not present

## 2019-03-05 DIAGNOSIS — G4700 Insomnia, unspecified: Secondary | ICD-10-CM | POA: Diagnosis present

## 2019-03-05 DIAGNOSIS — S8991XA Unspecified injury of right lower leg, initial encounter: Secondary | ICD-10-CM | POA: Diagnosis not present

## 2019-03-05 DIAGNOSIS — G9341 Metabolic encephalopathy: Secondary | ICD-10-CM | POA: Diagnosis present

## 2019-03-05 DIAGNOSIS — Z79899 Other long term (current) drug therapy: Secondary | ICD-10-CM

## 2019-03-05 DIAGNOSIS — A419 Sepsis, unspecified organism: Secondary | ICD-10-CM

## 2019-03-05 DIAGNOSIS — R519 Headache, unspecified: Secondary | ICD-10-CM | POA: Diagnosis not present

## 2019-03-05 DIAGNOSIS — R410 Disorientation, unspecified: Secondary | ICD-10-CM | POA: Diagnosis not present

## 2019-03-05 DIAGNOSIS — Z86718 Personal history of other venous thrombosis and embolism: Secondary | ICD-10-CM

## 2019-03-05 DIAGNOSIS — R404 Transient alteration of awareness: Secondary | ICD-10-CM | POA: Diagnosis not present

## 2019-03-05 DIAGNOSIS — Z9049 Acquired absence of other specified parts of digestive tract: Secondary | ICD-10-CM

## 2019-03-05 DIAGNOSIS — R4182 Altered mental status, unspecified: Secondary | ICD-10-CM | POA: Diagnosis not present

## 2019-03-05 DIAGNOSIS — M6282 Rhabdomyolysis: Secondary | ICD-10-CM | POA: Diagnosis not present

## 2019-03-05 DIAGNOSIS — N8500 Endometrial hyperplasia, unspecified: Secondary | ICD-10-CM | POA: Diagnosis present

## 2019-03-05 DIAGNOSIS — M5489 Other dorsalgia: Secondary | ICD-10-CM | POA: Diagnosis not present

## 2019-03-05 DIAGNOSIS — E876 Hypokalemia: Secondary | ICD-10-CM | POA: Diagnosis not present

## 2019-03-05 DIAGNOSIS — Z96653 Presence of artificial knee joint, bilateral: Secondary | ICD-10-CM | POA: Diagnosis present

## 2019-03-05 DIAGNOSIS — S0990XA Unspecified injury of head, initial encounter: Secondary | ICD-10-CM | POA: Diagnosis not present

## 2019-03-05 DIAGNOSIS — Z20822 Contact with and (suspected) exposure to covid-19: Secondary | ICD-10-CM | POA: Diagnosis not present

## 2019-03-05 DIAGNOSIS — Z8261 Family history of arthritis: Secondary | ICD-10-CM

## 2019-03-05 DIAGNOSIS — H9190 Unspecified hearing loss, unspecified ear: Secondary | ICD-10-CM | POA: Diagnosis present

## 2019-03-05 DIAGNOSIS — S299XXA Unspecified injury of thorax, initial encounter: Secondary | ICD-10-CM | POA: Diagnosis not present

## 2019-03-05 DIAGNOSIS — D696 Thrombocytopenia, unspecified: Secondary | ICD-10-CM | POA: Diagnosis not present

## 2019-03-05 DIAGNOSIS — Z20828 Contact with and (suspected) exposure to other viral communicable diseases: Secondary | ICD-10-CM | POA: Diagnosis not present

## 2019-03-05 DIAGNOSIS — Z8679 Personal history of other diseases of the circulatory system: Secondary | ICD-10-CM

## 2019-03-05 DIAGNOSIS — Z87891 Personal history of nicotine dependence: Secondary | ICD-10-CM

## 2019-03-05 DIAGNOSIS — E119 Type 2 diabetes mellitus without complications: Secondary | ICD-10-CM

## 2019-03-05 DIAGNOSIS — A4150 Gram-negative sepsis, unspecified: Secondary | ICD-10-CM | POA: Diagnosis present

## 2019-03-05 DIAGNOSIS — S3991XA Unspecified injury of abdomen, initial encounter: Secondary | ICD-10-CM | POA: Diagnosis not present

## 2019-03-05 DIAGNOSIS — E1142 Type 2 diabetes mellitus with diabetic polyneuropathy: Secondary | ICD-10-CM | POA: Diagnosis not present

## 2019-03-05 DIAGNOSIS — Z751 Person awaiting admission to adequate facility elsewhere: Secondary | ICD-10-CM

## 2019-03-05 DIAGNOSIS — Z880 Allergy status to penicillin: Secondary | ICD-10-CM

## 2019-03-05 HISTORY — DX: Sepsis, unspecified organism: A41.9

## 2019-03-05 LAB — CBG MONITORING, ED: Glucose-Capillary: 142 mg/dL — ABNORMAL HIGH (ref 70–99)

## 2019-03-05 LAB — URINALYSIS, ROUTINE W REFLEX MICROSCOPIC
Bilirubin Urine: NEGATIVE
Glucose, UA: 150 mg/dL — AB
Ketones, ur: 20 mg/dL — AB
Nitrite: POSITIVE — AB
Protein, ur: >=300 mg/dL — AB
Specific Gravity, Urine: 1.024 (ref 1.005–1.030)
pH: 7 (ref 5.0–8.0)

## 2019-03-05 LAB — POCT I-STAT EG7
Acid-base deficit: 2 mmol/L (ref 0.0–2.0)
Bicarbonate: 22.5 mmol/L (ref 20.0–28.0)
Calcium, Ion: 1.28 mmol/L (ref 1.15–1.40)
HCT: 41 % (ref 36.0–46.0)
Hemoglobin: 13.9 g/dL (ref 12.0–15.0)
O2 Saturation: 74 %
Potassium: 3.6 mmol/L (ref 3.5–5.1)
Sodium: 142 mmol/L (ref 135–145)
TCO2: 24 mmol/L (ref 22–32)
pCO2, Ven: 36 mmHg — ABNORMAL LOW (ref 44.0–60.0)
pH, Ven: 7.404 (ref 7.250–7.430)
pO2, Ven: 39 mmHg (ref 32.0–45.0)

## 2019-03-05 LAB — COMPREHENSIVE METABOLIC PANEL
ALT: 46 U/L — ABNORMAL HIGH (ref 0–44)
AST: 127 U/L — ABNORMAL HIGH (ref 15–41)
Albumin: 3.9 g/dL (ref 3.5–5.0)
Alkaline Phosphatase: 91 U/L (ref 38–126)
Anion gap: 13 (ref 5–15)
BUN: 30 mg/dL — ABNORMAL HIGH (ref 8–23)
CO2: 20 mmol/L — ABNORMAL LOW (ref 22–32)
Calcium: 10.1 mg/dL (ref 8.9–10.3)
Chloride: 106 mmol/L (ref 98–111)
Creatinine, Ser: 0.82 mg/dL (ref 0.44–1.00)
GFR calc Af Amer: >60 mL/min (ref >60)
GFR calc non Af Amer: >60 mL/min (ref >60)
Glucose, Bld: 150 mg/dL — ABNORMAL HIGH (ref 70–99)
Potassium: 4.4 mmol/L (ref 3.5–5.1)
Sodium: 139 mmol/L (ref 135–145)
Total Bilirubin: 1.2 mg/dL (ref 0.3–1.2)
Total Protein: 6.3 g/dL — ABNORMAL LOW (ref 6.5–8.1)

## 2019-03-05 LAB — TROPONIN I (HIGH SENSITIVITY)
Troponin I (High Sensitivity): 69 ng/L — ABNORMAL HIGH (ref <18)
Troponin I (High Sensitivity): 65 ng/L — ABNORMAL HIGH (ref <18)

## 2019-03-05 LAB — PHOSPHORUS: Phosphorus: 3 mg/dL (ref 2.5–4.6)

## 2019-03-05 LAB — CBC WITH DIFFERENTIAL/PLATELET
Abs Immature Granulocytes: 0.13 10*3/uL — ABNORMAL HIGH (ref 0.00–0.07)
Basophils Absolute: 0 10*3/uL (ref 0.0–0.1)
Basophils Relative: 0 %
Eosinophils Absolute: 0 10*3/uL (ref 0.0–0.5)
Eosinophils Relative: 0 %
HCT: 42.8 % (ref 36.0–46.0)
Hemoglobin: 13.8 g/dL (ref 12.0–15.0)
Immature Granulocytes: 1 %
Lymphocytes Relative: 3 %
Lymphs Abs: 0.7 10*3/uL (ref 0.7–4.0)
MCH: 29.7 pg (ref 26.0–34.0)
MCHC: 32.2 g/dL (ref 30.0–36.0)
MCV: 92 fL (ref 80.0–100.0)
Monocytes Absolute: 2.1 10*3/uL — ABNORMAL HIGH (ref 0.1–1.0)
Monocytes Relative: 10 %
Neutro Abs: 18.2 10*3/uL — ABNORMAL HIGH (ref 1.7–7.7)
Neutrophils Relative %: 86 %
Platelets: 193 10*3/uL (ref 150–400)
RBC: 4.65 MIL/uL (ref 3.87–5.11)
RDW: 12.8 % (ref 11.5–15.5)
WBC: 21.1 10*3/uL — ABNORMAL HIGH (ref 4.0–10.5)
nRBC: 0 % (ref 0.0–0.2)

## 2019-03-05 LAB — GLUCOSE, CAPILLARY: Glucose-Capillary: 111 mg/dL — ABNORMAL HIGH (ref 70–99)

## 2019-03-05 LAB — PROTIME-INR
INR: 1.1 (ref 0.8–1.2)
Prothrombin Time: 14.6 seconds (ref 11.4–15.2)

## 2019-03-05 LAB — LACTIC ACID, PLASMA
Lactic Acid, Venous: 1.7 mmol/L (ref 0.5–1.9)
Lactic Acid, Venous: 1.8 mmol/L (ref 0.5–1.9)

## 2019-03-05 LAB — CK: Total CK: 3107 U/L — ABNORMAL HIGH (ref 38–234)

## 2019-03-05 LAB — SARS CORONAVIRUS 2 (TAT 6-24 HRS): SARS Coronavirus 2: NEGATIVE

## 2019-03-05 LAB — MAGNESIUM: Magnesium: 2 mg/dL (ref 1.7–2.4)

## 2019-03-05 LAB — LIPASE, BLOOD: Lipase: 14 U/L (ref 11–51)

## 2019-03-05 MED ORDER — SODIUM CHLORIDE 0.9% FLUSH
3.0000 mL | Freq: Two times a day (BID) | INTRAVENOUS | Status: DC
  Administered 2019-03-06 – 2019-03-10 (×4): 3 mL via INTRAVENOUS

## 2019-03-05 MED ORDER — SODIUM CHLORIDE 0.9 % IV BOLUS (SEPSIS)
250.0000 mL | Freq: Once | INTRAVENOUS | Status: AC
  Administered 2019-03-05: 250 mL via INTRAVENOUS

## 2019-03-05 MED ORDER — MORPHINE SULFATE (PF) 2 MG/ML IV SOLN: 1.0000 mg | INTRAVENOUS | Status: DC | PRN

## 2019-03-05 MED ORDER — MORPHINE SULFATE (PF) 2 MG/ML IV SOLN
2.0000 mg | INTRAVENOUS | Status: DC | PRN
  Administered 2019-03-05 (×2): 2 mg via INTRAVENOUS
  Filled 2019-03-05 (×2): qty 1

## 2019-03-05 MED ORDER — ACETAMINOPHEN 325 MG PO TABS
650.0000 mg | ORAL_TABLET | Freq: Four times a day (QID) | ORAL | Status: DC | PRN
  Administered 2019-03-06 – 2019-03-08 (×2): 650 mg via ORAL
  Filled 2019-03-05 (×2): qty 2

## 2019-03-05 MED ORDER — ONDANSETRON HCL 4 MG/2ML IJ SOLN: 4.0000 mg | Freq: Four times a day (QID) | INTRAMUSCULAR | Status: DC | PRN

## 2019-03-05 MED ORDER — SODIUM CHLORIDE 0.9 % IV SOLN
1000.0000 mL | INTRAVENOUS | Status: AC
  Administered 2019-03-05: 1000 mL via INTRAVENOUS

## 2019-03-05 MED ORDER — ACETAMINOPHEN 650 MG RE SUPP: 650.0000 mg | Freq: Four times a day (QID) | RECTAL | Status: DC | PRN

## 2019-03-05 MED ORDER — SODIUM CHLORIDE 0.9 % IV BOLUS
1000.0000 mL | Freq: Once | INTRAVENOUS | Status: AC
  Administered 2019-03-05: 1000 mL via INTRAVENOUS

## 2019-03-05 MED ORDER — IOHEXOL 300 MG/ML  SOLN
100.0000 mL | Freq: Once | INTRAMUSCULAR | Status: AC | PRN
  Administered 2019-03-05: 100 mL via INTRAVENOUS

## 2019-03-05 MED ORDER — ONDANSETRON HCL 4 MG PO TABS: 4.0000 mg | ORAL_TABLET | Freq: Four times a day (QID) | ORAL | Status: DC | PRN

## 2019-03-05 MED ORDER — ENOXAPARIN SODIUM 40 MG/0.4ML ~~LOC~~ SOLN
40.0000 mg | SUBCUTANEOUS | Status: DC
  Administered 2019-03-05 – 2019-03-06 (×2): 40 mg via SUBCUTANEOUS
  Filled 2019-03-05 (×2): qty 0.4

## 2019-03-05 MED ORDER — SODIUM CHLORIDE 0.9 % IV SOLN
1.0000 g | INTRAVENOUS | Status: DC
  Administered 2019-03-06: 1 g via INTRAVENOUS
  Filled 2019-03-05: qty 10
  Filled 2019-03-05: qty 1

## 2019-03-05 MED ORDER — SODIUM CHLORIDE 0.9 % IV SOLN
1.0000 g | Freq: Once | INTRAVENOUS | Status: AC
  Administered 2019-03-05: 1 g via INTRAVENOUS
  Filled 2019-03-05: qty 10

## 2019-03-05 MED ORDER — SODIUM CHLORIDE 0.9 % IV SOLN
1000.0000 mL | INTRAVENOUS | Status: DC
  Administered 2019-03-05: 1000 mL via INTRAVENOUS

## 2019-03-05 NOTE — ED Notes (Signed)
Sonny Dandy (nephew); [phone]

## 2019-03-05 NOTE — ED Provider Notes (Signed)
[facility] EMERGENCY DEPARTMENT Provider Note   CSN: [id number] Arrival date & time: 03/05/19  1425     History Chief Complaint  Patient presents with  . Altered Mental Status  . Kristina Wilkinson is a 86 y.o. female.  HPI Patient was last seen well yesterday at about 4:30 PM.  She fell sometime after that and spent the night lying on the floor.  Her niece went to her home to check on her and found her that way.  EMS reports that her right leg was flexed back behind her and outward.  They were able to realign it and place her on a backboard.  Patient has been alert with no respiratory distress during transport.  Initial CBG was 170.  Patient's airway was intact.  Patient is alert and answering questions.  She reports she has pain all through her back and leg.    Past Medical History:  Diagnosis Date  . Arthritis   . CNS aneurysm 1983   s/p clipping  . Diabetes mellitus type II    diet controlled   . DVT (deep venous thrombosis) ([place]) 10/15  . Elevated glucose   . Hypertension   . Insomnia   . Knee pain    Left Dr. Meyer Cory  . LBP (low back pain)   . PN (peripheral neuropathy)    unknown etiol    Patient Active Problem List   Diagnosis Date Noted  . Open toe wound 12/01/2018  . Traumatic rhabdomyolysis ([place]) 11/30/2018  . Cellulitis 11/30/2018  . Transaminitis 11/30/2018  . Abnormal urinalysis 11/30/2018  . Leukocytosis 11/30/2018  . Hypokalemia 11/30/2018  . Scalp laceration   . Skull fracture ([place]) 07/13/2016  . Chronic left shoulder pain 04/03/2016  . Diabetes mellitus without complication ([place]) 0000000  . Hearing loss 06/21/2015  . Advance care planning 12/17/2014  . Palpitations 07/06/2014  . Pain in joint, ankle and foot 04/20/2014  . DVT, lower extremity ([place]) 11/19/2013  . Edema 10/20/2013  . Headache(784.0) 10/20/2013  . Left leg swelling 10/20/2013  . Aneurysm of basilar artery ([facility]) 10/20/2013  . Bradycardia 10/20/2013   . Vitamin B12 deficiency 07/16/2013  . Paresthesia of both feet 05/17/2013  . TMJ arthritis 09/11/2012  . Ear pain 09/11/2012  . History of transfusion of packed red blood cells 09/11/2012  . Spinal stenosis 04/13/2011  . Well adult exam 01/23/2011  . Cholecystitis with cholelithiasis, s/p lap chole 11/10/2010 12/01/2010  . Shoulder pain, right 07/25/2010  . TOBACCO USE, QUIT 03/09/2009  . KNEE PAIN 05/18/2008  . PN (peripheral neuropathy) 08/19/2007  . LEG PAIN 08/19/2007  . Vitamin D deficiency 02/17/2007  . INSOMNIA, PERSISTENT 02/17/2007  . Essential hypertension 02/17/2007  . GERD 02/17/2007  . OSTEOARTHRITIS 02/17/2007  . LOW BACK PAIN 02/17/2007  . Hyperglycemia 01/28/2007  . UNSPECIFIED OSTEOPOROSIS 01/28/2007  . PRECIPITOUS DROP IN HEMATOCRIT 01/28/2007    Past Surgical History:  Procedure Laterality Date  . CEREBRAL ANEURYSM REPAIR     initial repair in the 1980s, with second surgery in the 1990s  . CHOLECYSTECTOMY    . JOINT REPLACEMENT    . KNEE ARTHROPLASTY  04/2009   Left  . TOTAL KNEE ARTHROPLASTY    . TUBAL LIGATION       OB History   No obstetric history on file.     Family History  Problem Relation Age of Onset  . Arthritis Mother   . Stroke Mother   . Stroke Father   .  Arthritis-Osteo Son     Social History   Tobacco Use  . Smoking status: Former Research scientist (life sciences)  . Smokeless tobacco: Never Used  . Tobacco comment: quit in 1983  Substance Use Topics  . Alcohol use: No    Alcohol/week: 0.0 standard drinks  . Drug use: No    Home Medications Prior to Admission medications   Medication Sig Start Date End Date Taking? Authorizing Provider  amLODipine (NORVASC) 5 MG tablet Take 1 tablet (5 mg total) by mouth daily. 12/06/18 01/05/19  Donne Hazel, MD  lisinopril (ZESTRIL) 10 MG tablet Take 1 tablet (10 mg total) by mouth daily. 12/06/18 01/05/19  Donne Hazel, MD  rOPINIRole (REQUIP) 0.25 MG tablet Take 1 tablet (0.25 mg total) by mouth at  bedtime. 12/05/18 01/04/19  Donne Hazel, MD    Allergies    Gabapentin, Lyrica [pregabalin], Other, Penicillins, and Vit d-vit e-safflower oil  Review of Systems   Review of Systems 10 Systems reviewed and are negative for acute change except as noted in the HPI.  Physical Exam Updated Vital Signs BP (!) 182/80   Pulse 73   Temp (!) 95.6 F (35.3 C) (Temporal)   Resp 18   SpO2 98%   Physical Exam Constitutional:      Comments: Patient is alert.  She has cervical collar in place.  She has no respiratory distress.  GCS is 15.  HENT:     Head: Normocephalic and atraumatic.     Comments: No facial trauma.  Airway is patent. Eyes:     Extraocular Movements: Extraocular movements intact.     Pupils: Pupils are equal, round, and reactive to light.  Neck:     Comments: Cervical collar maintained. Cardiovascular:     Rate and Rhythm: Normal rate and regular rhythm.     Pulses: Normal pulses.     Heart sounds: Normal heart sounds.  Pulmonary:     Effort: Pulmonary effort is normal.     Breath sounds: Normal breath sounds.  Abdominal:     General: There is no distension.     Palpations: Abdomen is soft.     Tenderness: There is no abdominal tenderness. There is no guarding.  Musculoskeletal:     Comments: Right lower extremity is internally rotated and shortened.  Dorsalis pedis pulses 2+ and strong.  Left lower extremity normal alignment.  Patient is pleasant range of motion of both upper extremities without any reported significant pain at the shoulder elbow or wrist.  He does have erythematous pressure skin changes at the medial aspect of both elbows over the soft tissues.  No open wounds. No visible contusions or abrasions to the patient's back or sacrum.  There is some slight diffuse erythema on the left side of the gluteus and the left side of the black possibly consistent with pressure injury but not pronounced at this time.  No abrasions or ecchymosis.  Patient diffusely  endorse discomfort to palpation throughout her back.  I cannot localize any step-off or focal bony tenderness of the spine.  Skin:    General: Skin is warm and dry.  Neurological:     Comments: Patient is alert.  She can provide her name.  She can answer simple questions.  She is very hard of hearing.  All movements are coordinated and purposeful.  No apparent focal motor deficits.  There are some pain limitations.  Psychiatric:        Mood and Affect: Mood normal.  ED Results / Procedures / Treatments   Labs (all labs ordered are listed, but only abnormal results are displayed) Labs Reviewed  CBG MONITORING, ED - Abnormal; Notable for the following components:      Result Value   Glucose-Capillary 142 (*)    All other components within normal limits  COMPREHENSIVE METABOLIC PANEL  LACTIC ACID, PLASMA  LACTIC ACID, PLASMA  LIPASE, BLOOD  CBC WITH DIFFERENTIAL/PLATELET  PROTIME-INR  URINALYSIS, ROUTINE W REFLEX MICROSCOPIC  BLOOD GAS, VENOUS  CK  MAGNESIUM  PHOSPHORUS  TROPONIN I (HIGH SENSITIVITY)    EKG None  Radiology No results found.  Procedures Procedures (including critical care time)  Medications Ordered in ED Medications  sodium chloride 0.9 % bolus 250 mL (has no administration in time range)    Followed by  0.9 %  sodium chloride infusion (has no administration in time range)  morphine 2 MG/ML injection 2 mg (has no administration in time range)    ED Course  I have reviewed the triage vital signs and the nursing notes.  Pertinent labs & imaging results that were available during my care of the patient were reviewed by me and considered in my medical decision making (see chart for details).    MDM Rules/Calculators/A&P                      Patient presents as outlined above.  She fell sometime late yesterday afternoon or evening.  She spent the night on the floor.  She is not on any anticoagulants.  Mental status is alert and interactive.  She  has what appears to be obvious deformity at the right hip.  Will commence with CT head and neck as well as thorax and abdomen pelvis due to other possible trauma with a fall.  At this time suspect right hip fracture or dislocation.  Will initiate small doses of morphine for pain control.  Will initiate IV fluid resuscitation.  At this time, airway is stable and patient's mental status is alert.  Dr. Darl Householder to assume care for the patient.  All diagnostic studies pending at this time. Final Clinical Impression(s) / ED Diagnoses Final diagnoses:  Fall, initial encounter    Rx / DC Orders ED Discharge Orders    None       Charlesetta Shanks, MD 03/05/19 [phone]

## 2019-03-05 NOTE — ED Notes (Addendum)
Pt to XRAY

## 2019-03-05 NOTE — H&P (Signed)
History and Physical    PREZLEY QADIR EPP:[id number] DOB: [birth year] DOA: 03/05/2019  PCP: Elby Beck, FNP  Patient coming from: Home  I have personally briefly reviewed patient's old medical records in [place]  Chief Complaint: Fall  HPI: Kristina Wilkinson is a 86 y.o. female with medical history significant for diet-controlled type 2 diabetes, hypertension, and history of DVT who presents to the ED for evaluation after a fall at home with prolonged downtime.  History limited from patient due to encephalopathy therefore supplemented by EDP and chart review.  Patient reportedly lives alone and was seen last well around 4:30 PM on 03/04/2019.  She says she fell sometime later that evening and spent the night lying on the floor.  Her niece went to her home to check on her and found her on the ground.  EMS were called and per ED documentation when she was found her right leg was flexed back behind her and outwards.  She was realigned and placed on a backboard and brought to the ED for further evaluation.  ED Course:  Initial vitals showed BP 174/77, pulse 75, RR 15, temp 95.6 Fahrenheit, SPO2 98% on room air.  Labs are notable for sodium 139, potassium 4.4, bicarb 20, BUN 30, creatinine 0.82, AST 127, ALT 46, alk phos 91, total bilirubin 1.2, WBC 21.1, hemoglobin 13.8, platelets 193,000, CK 3107, magnesium 2.0, phosphorus 3.0, lipase 14, lactic acid 1.7, high-sensitivity troponin I 69 > 65.  Urinalysis shows positive nitrites, trace leukocytes, 11-20 WBCs, 0-5 RBCs, many bacteria microscopy.  Urine culture was obtained and pending.  Right hip x-ray is negative for acute injury of the right hip.  Right knee x-ray shows right knee replacement without evidence of hardware loosening or infection.  CT head without contrast is negative for acute intracranial abnormalities or skull fracture.  CT cervical spine without contrast is negative for fracture or acute finding.  CT  chest/abdomen/pelvis with contrast negative for acute/traumatic intrathoracic, abdominal, or pelvic pathology.  Thickened appearance of endometrium is noted.  Patient was given 1.25 L normal saline, IV morphine, and IV ceftriaxone.  Hospitalist service was consulted to admit for further evaluation and management.  Review of Systems:  Unable to obtain due to encephalopathy.   Past Medical History:  Diagnosis Date  . Arthritis   . CNS aneurysm 1983   s/p clipping  . Diabetes mellitus type II    diet controlled   . DVT (deep venous thrombosis) ([address]) 10/15  . Elevated glucose   . Hypertension   . Insomnia   . Knee pain    Left Dr. Meyer Cory  . LBP (low back pain)   . PN (peripheral neuropathy)    unknown etiol    Past Surgical History:  Procedure Laterality Date  . CEREBRAL ANEURYSM REPAIR     initial repair in the 1980s, with second surgery in the 1990s  . CHOLECYSTECTOMY    . JOINT REPLACEMENT    . KNEE ARTHROPLASTY  04/2009   Left  . TOTAL KNEE ARTHROPLASTY    . TUBAL LIGATION      Social History:  reports that she has quit smoking. She has never used smokeless tobacco. She reports that she does not drink alcohol or use drugs.  Allergies  Allergen Reactions  . Gabapentin Other (See Comments)    Made the legs "go numb"  . Lyrica [Pregabalin] Other (See Comments)    "Made me pass out"  . Other Other (See Comments)    "  MD mixed a blood pressure medication with the Clinoril I was taking and I couldn't walk"  . Penicillins Other (See Comments)    Has patient had a PCN reaction causing immediate rash, facial/tongue/throat swelling, SOB or lightheadedness with hypotension: Unk- exact reaction not recalled, but is allergic Has patient had a PCN reaction causing severe rash involving mucus membranes or skin necrosis: Unk Has patient had a PCN reaction that required hospitalization: Unk Has patient had a PCN reaction occurring within the last 10 years: Yes If all of the above  answers are "NO", then may proceed with Cephalosporin use.   Jearl Klinefelter D-Vit E-Safflower Oil Other (See Comments)    "Made me achy and sick"    Family History  Problem Relation Age of Onset  . Arthritis Mother   . Stroke Mother   . Stroke Father   . Arthritis-Osteo Son      Prior to Admission medications   Medication Sig Start Date End Date Taking? Authorizing Provider  amLODipine (NORVASC) 5 MG tablet Take 1 tablet (5 mg total) by mouth daily. 12/06/18 01/05/19  Donne Hazel, MD  lisinopril (ZESTRIL) 10 MG tablet Take 1 tablet (10 mg total) by mouth daily. 12/06/18 01/05/19  Donne Hazel, MD  rOPINIRole (REQUIP) 0.25 MG tablet Take 1 tablet (0.25 mg total) by mouth at bedtime. 12/05/18 01/04/19  Donne Hazel, MD    Physical Exam: Vitals:   03/05/19 1800 03/05/19 1815 03/05/19 1900 03/05/19 1930  BP: (!) 153/78 (!) 171/77 (!) 168/76 (!) 181/72  Pulse: 86 85 89 95  Resp: 18 14 15  (!) 21  Temp:    98.8 F (37.1 C)  TempSrc:    Oral  SpO2: 94% 95% 97% 97%   Constitutional: Elderly woman resting supine in bed, confused, somewhat agitated Eyes: PERRL, lids and conjunctivae normal ENMT: Mucous membranes are moist. Posterior pharynx clear of any exudate or lesions.Normal dentition.  Neck: normal, supple, no masses. Respiratory: clear to auscultation anteriorly. Normal respiratory effort. No accessory muscle use.  Cardiovascular: Regular rate and rhythm, no murmurs / rubs / gallops.  Trace bilateral lower extremity edema. 2+ pedal pulses. Abdomen: no tenderness, no masses palpated. No hepatosplenomegaly. Bowel sounds positive.  Musculoskeletal: no clubbing / cyanosis. No joint deformity upper and lower extremities.  Bilateral wrist splints in place, not moving extremities to command. skin: no rashes, lesions, ulcers. No induration Neurologic: Exam limited due to encephalopathy.  CN 2-12 grossly intact. Sensation intact, plantars downgoing bilaterally, not moving lower  extremities to command. Psychiatric: Awake and alert, disoriented.   Labs on Admission: I have personally reviewed following labs and imaging studies  CBC: Recent Labs  Lab 03/05/19 1445 03/05/19 1453  WBC 21.1*  --   NEUTROABS 18.2*  --   HGB 13.8 13.9  HCT 42.8 41.0  MCV 92.0  --   PLT 193  --    Basic Metabolic Panel: Recent Labs  Lab 03/05/19 1445 03/05/19 1453  NA 139 142  K 4.4 3.6  CL 106  --   CO2 20*  --   GLUCOSE 150*  --   BUN 30*  --   CREATININE 0.82  --   CALCIUM 10.1  --   MG 2.0  --   PHOS 3.0  --    GFR: CrCl cannot be calculated (Unknown ideal weight.). Liver Function Tests: Recent Labs  Lab 03/05/19 1445  AST 127*  ALT 46*  ALKPHOS 91  BILITOT 1.2  PROT 6.3*  ALBUMIN  3.9   Recent Labs  Lab 03/05/19 1445  LIPASE 14   No results for input(s): AMMONIA in the last 168 hours. Coagulation Profile: Recent Labs  Lab 03/05/19 1635  INR 1.1   Cardiac Enzymes: Recent Labs  Lab 03/05/19 1445  CKTOTAL 3,107*   BNP (last 3 results) No results for input(s): PROBNP in the last 8760 hours. HbA1C: No results for input(s): HGBA1C in the last 72 hours. CBG: Recent Labs  Lab 03/05/19 1436  GLUCAP 142*   Lipid Profile: No results for input(s): CHOL, HDL, LDLCALC, TRIG, CHOLHDL, LDLDIRECT in the last 72 hours. Thyroid Function Tests: No results for input(s): TSH, T4TOTAL, FREET4, T3FREE, THYROIDAB in the last 72 hours. Anemia Panel: No results for input(s): VITAMINB12, FOLATE, FERRITIN, TIBC, IRON, RETICCTPCT in the last 72 hours. Urine analysis:    Component Value Date/Time   COLORURINE YELLOW 03/05/2019 1700   APPEARANCEUR HAZY (A) 03/05/2019 1700   LABSPEC 1.024 03/05/2019 1700   PHURINE 7.0 03/05/2019 1700   GLUCOSEU 150 (A) 03/05/2019 1700   GLUCOSEU NEGATIVE 01/16/2011 0914   HGBUR MODERATE (A) 03/05/2019 1700   BILIRUBINUR NEGATIVE 03/05/2019 1700   BILIRUBINUR neg 05/01/2017 1220   KETONESUR 20 (A) 03/05/2019 1700    PROTEINUR >=300 (A) 03/05/2019 1700   UROBILINOGEN 0.2 05/01/2017 1220   UROBILINOGEN 0.2 04/14/2012 0930   NITRITE POSITIVE (A) 03/05/2019 1700   LEUKOCYTESUR TRACE (A) 03/05/2019 1700    Radiological Exams on Admission: DG Knee 2 Views Right  Result Date: 03/05/2019 CLINICAL DATA:  Status post fall. EXAM: RIGHT KNEE - 1-2 VIEW COMPARISON:  None. FINDINGS: There is a right knee replacement without evidence of surrounding lucency to suggest the presence of hardware loosening or infection. No evidence of fracture, dislocation, or joint effusion. Soft tissues are unremarkable. IMPRESSION: Right knee replacement without evidence of hardware loosening or infection. Electronically Signed   By: Virgina Norfolk M.D.   On: 03/05/2019 15:35   CT Head Wo Contrast  Result Date: 03/05/2019 CLINICAL DATA:  Fall occurring yesterday. Patient found lying on the floor. Headache. EXAM: CT HEAD WITHOUT CONTRAST CT CERVICAL SPINE WITHOUT CONTRAST TECHNIQUE: Multidetector CT imaging of the head and cervical spine was performed following the standard protocol without intravenous contrast. Multiplanar CT image reconstructions of the cervical spine were also generated. COMPARISON:  11/30/2018 FINDINGS: CT HEAD FINDINGS Brain: No evidence of acute infarction, hemorrhage, hydrocephalus, extra-axial collection or mass lesion/mass effect. There are changes from a previous occipital craniotomy. Metal vascular clips and possible coil material as well as calcifications are noted along the foramen magnum, stable compared to the prior head CT. Mild periventricular white matter hypoattenuation is noted consistent with chronic microvascular ischemic change. Vascular: Fairly prominent carotid calcifications. No hyperattenuating artery. Skull: No acute fracture or bone lesion. Sinuses/Orbits: Globes and orbits are unremarkable. Sinuses and mastoid air cells are clear. Other: None. CT CERVICAL SPINE FINDINGS Alignment: Normal. Skull base  and vertebrae: No acute fracture. No primary bone lesion or focal pathologic process. Stable surgical changes from a previous suboccipital craniotomy. Soft tissues and spinal canal: No prevertebral fluid or swelling. No visible canal hematoma. Disc levels: Spondylotic disc bulging throughout the cervical spine. Moderate loss of disc height at C4-C5 and C5-C6 with moderate to severe loss of disc height at C6-C7. No convincing disc herniation. Upper chest: No acute findings.  Scarring in the lung apices. Other: None. IMPRESSION: HEAD CT 1. No acute intracranial abnormalities. 2. No skull fracture. CERVICAL CT 1. No fracture or acute  finding. Electronically Signed   By: Lajean Manes M.D.   On: 03/05/2019 17:22   CT Chest W Contrast  Result Date: 03/05/2019 CLINICAL DATA:  86 year old female with trauma. EXAM: CT CHEST, ABDOMEN, AND PELVIS WITH CONTRAST TECHNIQUE: Multidetector CT imaging of the chest, abdomen and pelvis was performed following the standard protocol during bolus administration of intravenous contrast. CONTRAST:  183m OMNIPAQUE IOHEXOL 300 MG/ML  SOLN COMPARISON:  Chest radiograph dated 11/30/2018. FINDINGS: Evaluation of this exam is limited due to respiratory motion artifact. CT CHEST FINDINGS Cardiovascular: There is no cardiomegaly or pericardial effusion. Three-vessel coronary vascular calcification. There is calcification of the mitral annulus. There is moderate atherosclerotic calcification of the thoracic aorta. No aneurysmal dilatation or dissection. The origins of the great vessels of the aortic arch appear patent as visualized. A the central pulmonary arteries are grossly unremarkable for the degree of opacification. Mediastinum/Nodes: There is no hilar or mediastinal adenopathy. The esophagus is grossly unremarkable. No mediastinal fluid collection. There is an 8 mm right thyroid hypodense nodule. Lungs/Pleura: Bilateral lower lobe linear atelectasis/scarring noted. Confluent areas of  hazy density throughout the lungs likely represents areas of atelectatic changes or air trapping related to underlying small vessel versus small airway disease. No lobar consolidation, pleural effusion, or pneumothorax. The central airways are patent. Musculoskeletal: No acute osseous pathology. Osteopenia with degenerative changes of the spine. A focus of sclerosis involving the left posterior seventh rib at the costovertebral junction likely a bone island. CT ABDOMEN PELVIS FINDINGS No intra-abdominal free air or free fluid. Hepatobiliary: The liver is unremarkable. No intrahepatic biliary ductal dilatation. Cholecystectomy. Mildly dilated common bile duct, likely post cholecystectomy. No retained calcified stone noted in the central CBD. Pancreas: Unremarkable. No pancreatic ductal dilatation or surrounding inflammatory changes. Spleen: Normal in size without focal abnormality. Adrenals/Urinary Tract: The adrenal glands are unremarkable. There is no hydronephrosis on either side. There is symmetric enhancement and excretion of contrast by both kidneys. The visualized ureters and urinary bladder appear unremarkable. Stomach/Bowel: There is sigmoid diverticulosis without active inflammatory changes. There is no bowel obstruction or active inflammation. The appendix is not visualized with certainty. No inflammatory changes identified in the right lower quadrant. Vascular/Lymphatic: Moderate aortoiliac atherosclerotic disease. The IVC is grossly unremarkable. No portal venous gas. There is no adenopathy. Reproductive: The uterus is anteverted. There is mild thickened appearance of the endometrium measuring approximately 7 mm in thickness. Dedicated pelvic ultrasound is recommended for better evaluation on a nonemergent/outpatient basis. No adnexal masses identified. Other: There is a 3 cm umbilical hernia with protrusion of anterior wall of a segment of colon. No obstruction Musculoskeletal: There is osteopenia with  degenerative changes of the spine. Grade 1 L5-S1 retrolisthesis. No acute osseous pathology. IMPRESSION: 1. No acute/traumatic intrathoracic, abdominal, or pelvic pathology. 2. Thickened appearance of the endometrium. Further evaluation with pelvic ultrasound on a nonemergent/outpatient basis recommended. 3.  Aortic Atherosclerosis (ICD10-I70.0). Electronically Signed   By: AAnner CreteM.D.   On: 03/05/2019 17:35   CT Cervical Spine Wo Contrast  Result Date: 03/05/2019 CLINICAL DATA:  Fall occurring yesterday. Patient found lying on the floor. Headache. EXAM: CT HEAD WITHOUT CONTRAST CT CERVICAL SPINE WITHOUT CONTRAST TECHNIQUE: Multidetector CT imaging of the head and cervical spine was performed following the standard protocol without intravenous contrast. Multiplanar CT image reconstructions of the cervical spine were also generated. COMPARISON:  11/30/2018 FINDINGS: CT HEAD FINDINGS Brain: No evidence of acute infarction, hemorrhage, hydrocephalus, extra-axial collection or mass lesion/mass effect. There are changes  from a previous occipital craniotomy. Metal vascular clips and possible coil material as well as calcifications are noted along the foramen magnum, stable compared to the prior head CT. Mild periventricular white matter hypoattenuation is noted consistent with chronic microvascular ischemic change. Vascular: Fairly prominent carotid calcifications. No hyperattenuating artery. Skull: No acute fracture or bone lesion. Sinuses/Orbits: Globes and orbits are unremarkable. Sinuses and mastoid air cells are clear. Other: None. CT CERVICAL SPINE FINDINGS Alignment: Normal. Skull base and vertebrae: No acute fracture. No primary bone lesion or focal pathologic process. Stable surgical changes from a previous suboccipital craniotomy. Soft tissues and spinal canal: No prevertebral fluid or swelling. No visible canal hematoma. Disc levels: Spondylotic disc bulging throughout the cervical spine. Moderate  loss of disc height at C4-C5 and C5-C6 with moderate to severe loss of disc height at C6-C7. No convincing disc herniation. Upper chest: No acute findings.  Scarring in the lung apices. Other: None. IMPRESSION: HEAD CT 1. No acute intracranial abnormalities. 2. No skull fracture. CERVICAL CT 1. No fracture or acute finding. Electronically Signed   By: Lajean Manes M.D.   On: 03/05/2019 17:22   CT Abdomen Pelvis W Contrast  Result Date: 03/05/2019 CLINICAL DATA:  86 year old female with trauma. EXAM: CT CHEST, ABDOMEN, AND PELVIS WITH CONTRAST TECHNIQUE: Multidetector CT imaging of the chest, abdomen and pelvis was performed following the standard protocol during bolus administration of intravenous contrast. CONTRAST:  192m OMNIPAQUE IOHEXOL 300 MG/ML  SOLN COMPARISON:  Chest radiograph dated 11/30/2018. FINDINGS: Evaluation of this exam is limited due to respiratory motion artifact. CT CHEST FINDINGS Cardiovascular: There is no cardiomegaly or pericardial effusion. Three-vessel coronary vascular calcification. There is calcification of the mitral annulus. There is moderate atherosclerotic calcification of the thoracic aorta. No aneurysmal dilatation or dissection. The origins of the great vessels of the aortic arch appear patent as visualized. A the central pulmonary arteries are grossly unremarkable for the degree of opacification. Mediastinum/Nodes: There is no hilar or mediastinal adenopathy. The esophagus is grossly unremarkable. No mediastinal fluid collection. There is an 8 mm right thyroid hypodense nodule. Lungs/Pleura: Bilateral lower lobe linear atelectasis/scarring noted. Confluent areas of hazy density throughout the lungs likely represents areas of atelectatic changes or air trapping related to underlying small vessel versus small airway disease. No lobar consolidation, pleural effusion, or pneumothorax. The central airways are patent. Musculoskeletal: No acute osseous pathology. Osteopenia with  degenerative changes of the spine. A focus of sclerosis involving the left posterior seventh rib at the costovertebral junction likely a bone island. CT ABDOMEN PELVIS FINDINGS No intra-abdominal free air or free fluid. Hepatobiliary: The liver is unremarkable. No intrahepatic biliary ductal dilatation. Cholecystectomy. Mildly dilated common bile duct, likely post cholecystectomy. No retained calcified stone noted in the central CBD. Pancreas: Unremarkable. No pancreatic ductal dilatation or surrounding inflammatory changes. Spleen: Normal in size without focal abnormality. Adrenals/Urinary Tract: The adrenal glands are unremarkable. There is no hydronephrosis on either side. There is symmetric enhancement and excretion of contrast by both kidneys. The visualized ureters and urinary bladder appear unremarkable. Stomach/Bowel: There is sigmoid diverticulosis without active inflammatory changes. There is no bowel obstruction or active inflammation. The appendix is not visualized with certainty. No inflammatory changes identified in the right lower quadrant. Vascular/Lymphatic: Moderate aortoiliac atherosclerotic disease. The IVC is grossly unremarkable. No portal venous gas. There is no adenopathy. Reproductive: The uterus is anteverted. There is mild thickened appearance of the endometrium measuring approximately 7 mm in thickness. Dedicated pelvic ultrasound is recommended for  better evaluation on a nonemergent/outpatient basis. No adnexal masses identified. Other: There is a 3 cm umbilical hernia with protrusion of anterior wall of a segment of colon. No obstruction Musculoskeletal: There is osteopenia with degenerative changes of the spine. Grade 1 L5-S1 retrolisthesis. No acute osseous pathology. IMPRESSION: 1. No acute/traumatic intrathoracic, abdominal, or pelvic pathology. 2. Thickened appearance of the endometrium. Further evaluation with pelvic ultrasound on a nonemergent/outpatient basis recommended. 3.   Aortic Atherosclerosis (ICD10-I70.0). Electronically Signed   By: Anner Crete M.D.   On: 03/05/2019 17:35   DG Hip Unilat With Pelvis 2-3 Views Right  Result Date: 03/05/2019 CLINICAL DATA:  Status post fall yesterday. EXAM: DG HIP (WITH OR WITHOUT PELVIS) 2-3V RIGHT COMPARISON:  None. FINDINGS: Generalized osteopenia. No fracture or dislocation. No aggressive osseous lesion. Mild osteoarthritis bilateral SI joints. IMPRESSION: No acute osseous injury of the right hip. Given the patient's age and osteopenia, if there is persistent clinical concern for an occult hip fracture, a MRI of the hip is recommended for increased sensitivity. Electronically Signed   By: Kathreen Devoid   On: 03/05/2019 15:34    EKG: Ordered and pending.  Assessment/Plan Principal Problem:   Urinary tract infection Active Problems:   Essential hypertension   Diabetes mellitus without complication ([place])   Fall at home  Kristina Wilkinson is a 86 y.o. female with medical history significant for diet-controlled type 2 diabetes, hypertension, and history of DVT who is admitted with sepsis due to UTI.  Sepsis due to UTI: Patient with hypothermia, leukocytosis, and encephalopathy on admission with urinalysis suggestive of UTI. -Continue empiric IV ceftriaxone -Follow urine culture  Fall at home with prolonged downtime and mild rhabdomyolysis: Patient unable to provide details regarding her fall.  Had a similar admission after a fall in October 2020.  Per discharge summary she declined discharge to SNF at that time. -Continue IV fluid resuscitation overnight -Repeat CK in a.m. -PT/OT eval -Monitor strict I/O's  Encephalopathy: In setting of sepsis and likely worsened after receiving IV morphine in the ED.  Continue management as above.  Hypertension: Home antihypertensives currently on hold.  Elevated troponin: Minimally elevated troponin 69 > 65.  Likely demand ischemia from sepsis and mild  rhabdomyolysis.  Diet-controlled type 2 diabetes: Continue to monitor.  DVT prophylaxis: Lovenox Code Status: Full code Family Communication: None available on admission. Disposition Plan: Pending clinical progress Consults called: None Admission status: Observation   Zada Finders MD [facility]  If 7PM-7AM, please contact night-coverage [email or web address]  03/05/2019, 8:02 PM

## 2019-03-05 NOTE — ED Triage Notes (Signed)
Pt from home with ems for a fall that occurred yesterday, LSN 1600 yesterday. Pt found by niece laying in the floor. Right leg was flexed outward when EMS arrived, obvious deformity noted to leg, strong pedal pulse noted. Pt arrives alert, oriented to self and time.   BP 200/84 P 102 rr18  CBg 170

## 2019-03-06 DIAGNOSIS — G9341 Metabolic encephalopathy: Secondary | ICD-10-CM | POA: Diagnosis not present

## 2019-03-06 DIAGNOSIS — Z9851 Tubal ligation status: Secondary | ICD-10-CM | POA: Diagnosis not present

## 2019-03-06 DIAGNOSIS — R41841 Cognitive communication deficit: Secondary | ICD-10-CM | POA: Diagnosis not present

## 2019-03-06 DIAGNOSIS — N8500 Endometrial hyperplasia, unspecified: Secondary | ICD-10-CM | POA: Diagnosis present

## 2019-03-06 DIAGNOSIS — N390 Urinary tract infection, site not specified: Secondary | ICD-10-CM

## 2019-03-06 DIAGNOSIS — I69828 Other speech and language deficits following other cerebrovascular disease: Secondary | ICD-10-CM | POA: Diagnosis not present

## 2019-03-06 DIAGNOSIS — Z9181 History of falling: Secondary | ICD-10-CM | POA: Diagnosis not present

## 2019-03-06 DIAGNOSIS — H9190 Unspecified hearing loss, unspecified ear: Secondary | ICD-10-CM | POA: Diagnosis present

## 2019-03-06 DIAGNOSIS — M6282 Rhabdomyolysis: Secondary | ICD-10-CM | POA: Diagnosis not present

## 2019-03-06 DIAGNOSIS — M4800 Spinal stenosis, site unspecified: Secondary | ICD-10-CM | POA: Diagnosis not present

## 2019-03-06 DIAGNOSIS — G4700 Insomnia, unspecified: Secondary | ICD-10-CM | POA: Diagnosis present

## 2019-03-06 DIAGNOSIS — Z96653 Presence of artificial knee joint, bilateral: Secondary | ICD-10-CM | POA: Diagnosis present

## 2019-03-06 DIAGNOSIS — Z87891 Personal history of nicotine dependence: Secondary | ICD-10-CM | POA: Diagnosis not present

## 2019-03-06 DIAGNOSIS — D559 Anemia due to enzyme disorder, unspecified: Secondary | ICD-10-CM | POA: Diagnosis not present

## 2019-03-06 DIAGNOSIS — M545 Low back pain: Secondary | ICD-10-CM | POA: Diagnosis not present

## 2019-03-06 DIAGNOSIS — Z7401 Bed confinement status: Secondary | ICD-10-CM | POA: Diagnosis not present

## 2019-03-06 DIAGNOSIS — I959 Hypotension, unspecified: Secondary | ICD-10-CM | POA: Diagnosis not present

## 2019-03-06 DIAGNOSIS — Z823 Family history of stroke: Secondary | ICD-10-CM | POA: Diagnosis not present

## 2019-03-06 DIAGNOSIS — Z20822 Contact with and (suspected) exposure to covid-19: Secondary | ICD-10-CM | POA: Diagnosis not present

## 2019-03-06 DIAGNOSIS — Z86718 Personal history of other venous thrombosis and embolism: Secondary | ICD-10-CM | POA: Diagnosis not present

## 2019-03-06 DIAGNOSIS — E538 Deficiency of other specified B group vitamins: Secondary | ICD-10-CM | POA: Diagnosis not present

## 2019-03-06 DIAGNOSIS — A4150 Gram-negative sepsis, unspecified: Secondary | ICD-10-CM

## 2019-03-06 DIAGNOSIS — R1312 Dysphagia, oropharyngeal phase: Secondary | ICD-10-CM | POA: Diagnosis not present

## 2019-03-06 DIAGNOSIS — A419 Sepsis, unspecified organism: Secondary | ICD-10-CM | POA: Diagnosis not present

## 2019-03-06 DIAGNOSIS — E119 Type 2 diabetes mellitus without complications: Secondary | ICD-10-CM | POA: Diagnosis not present

## 2019-03-06 DIAGNOSIS — E1142 Type 2 diabetes mellitus with diabetic polyneuropathy: Secondary | ICD-10-CM | POA: Diagnosis not present

## 2019-03-06 DIAGNOSIS — Z8679 Personal history of other diseases of the circulatory system: Secondary | ICD-10-CM | POA: Diagnosis not present

## 2019-03-06 DIAGNOSIS — M21951 Unspecified acquired deformity of right thigh: Secondary | ICD-10-CM | POA: Diagnosis not present

## 2019-03-06 DIAGNOSIS — B957 Other staphylococcus as the cause of diseases classified elsewhere: Secondary | ICD-10-CM | POA: Diagnosis not present

## 2019-03-06 DIAGNOSIS — I10 Essential (primary) hypertension: Secondary | ICD-10-CM | POA: Diagnosis not present

## 2019-03-06 DIAGNOSIS — R410 Disorientation, unspecified: Secondary | ICD-10-CM | POA: Diagnosis not present

## 2019-03-06 DIAGNOSIS — M26649 Arthritis of unspecified temporomandibular joint: Secondary | ICD-10-CM | POA: Diagnosis not present

## 2019-03-06 DIAGNOSIS — R4182 Altered mental status, unspecified: Secondary | ICD-10-CM | POA: Diagnosis not present

## 2019-03-06 DIAGNOSIS — L89612 Pressure ulcer of right heel, stage 2: Secondary | ICD-10-CM | POA: Diagnosis not present

## 2019-03-06 DIAGNOSIS — M810 Age-related osteoporosis without current pathological fracture: Secondary | ICD-10-CM | POA: Diagnosis not present

## 2019-03-06 DIAGNOSIS — D696 Thrombocytopenia, unspecified: Secondary | ICD-10-CM | POA: Diagnosis not present

## 2019-03-06 DIAGNOSIS — M6281 Muscle weakness (generalized): Secondary | ICD-10-CM | POA: Diagnosis not present

## 2019-03-06 DIAGNOSIS — R2681 Unsteadiness on feet: Secondary | ICD-10-CM | POA: Diagnosis not present

## 2019-03-06 DIAGNOSIS — Z751 Person awaiting admission to adequate facility elsewhere: Secondary | ICD-10-CM | POA: Diagnosis not present

## 2019-03-06 DIAGNOSIS — M2550 Pain in unspecified joint: Secondary | ICD-10-CM | POA: Diagnosis not present

## 2019-03-06 DIAGNOSIS — Z9049 Acquired absence of other specified parts of digestive tract: Secondary | ICD-10-CM | POA: Diagnosis not present

## 2019-03-06 DIAGNOSIS — E876 Hypokalemia: Secondary | ICD-10-CM | POA: Diagnosis not present

## 2019-03-06 HISTORY — DX: Urinary tract infection, site not specified: N39.0

## 2019-03-06 HISTORY — DX: Gram-negative sepsis, unspecified: A41.50

## 2019-03-06 LAB — COMPREHENSIVE METABOLIC PANEL
ALT: 48 U/L — ABNORMAL HIGH (ref 0–44)
AST: 111 U/L — ABNORMAL HIGH (ref 15–41)
Albumin: 2.9 g/dL — ABNORMAL LOW (ref 3.5–5.0)
Alkaline Phosphatase: 66 U/L (ref 38–126)
Anion gap: 12 (ref 5–15)
BUN: 26 mg/dL — ABNORMAL HIGH (ref 8–23)
CO2: 17 mmol/L — ABNORMAL LOW (ref 22–32)
Calcium: 8.9 mg/dL (ref 8.9–10.3)
Chloride: 113 mmol/L — ABNORMAL HIGH (ref 98–111)
Creatinine, Ser: 0.71 mg/dL (ref 0.44–1.00)
GFR calc Af Amer: >60 mL/min (ref >60)
GFR calc non Af Amer: >60 mL/min (ref >60)
Glucose, Bld: 119 mg/dL — ABNORMAL HIGH (ref 70–99)
Potassium: 3.2 mmol/L — ABNORMAL LOW (ref 3.5–5.1)
Sodium: 142 mmol/L (ref 135–145)
Total Bilirubin: 1 mg/dL (ref 0.3–1.2)
Total Protein: 4.9 g/dL — ABNORMAL LOW (ref 6.5–8.1)

## 2019-03-06 LAB — CBC
HCT: 35.9 % — ABNORMAL LOW (ref 36.0–46.0)
Hemoglobin: 11.7 g/dL — ABNORMAL LOW (ref 12.0–15.0)
MCH: 29.9 pg (ref 26.0–34.0)
MCHC: 32.6 g/dL (ref 30.0–36.0)
MCV: 91.8 fL (ref 80.0–100.0)
Platelets: 141 10*3/uL — ABNORMAL LOW (ref 150–400)
RBC: 3.91 MIL/uL (ref 3.87–5.11)
RDW: 13 % (ref 11.5–15.5)
WBC: 17.5 10*3/uL — ABNORMAL HIGH (ref 4.0–10.5)
nRBC: 0 % (ref 0.0–0.2)

## 2019-03-06 LAB — GLUCOSE, CAPILLARY
Glucose-Capillary: 101 mg/dL — ABNORMAL HIGH (ref 70–99)
Glucose-Capillary: 112 mg/dL — ABNORMAL HIGH (ref 70–99)
Glucose-Capillary: 103 mg/dL — ABNORMAL HIGH (ref 70–99)
Glucose-Capillary: 119 mg/dL — ABNORMAL HIGH (ref 70–99)

## 2019-03-06 LAB — HEMOGLOBIN A1C
Hgb A1c MFr Bld: 5.8 % — ABNORMAL HIGH (ref 4.8–5.6)
Mean Plasma Glucose: 119.76 mg/dL

## 2019-03-06 LAB — CK: Total CK: 1655 U/L — ABNORMAL HIGH (ref 38–234)

## 2019-03-06 MED ORDER — HYDRALAZINE HCL 25 MG PO TABS
25.0000 mg | ORAL_TABLET | Freq: Four times a day (QID) | ORAL | Status: DC | PRN
  Administered 2019-03-07 – 2019-03-08 (×2): 25 mg via ORAL
  Filled 2019-03-06 (×2): qty 1

## 2019-03-06 MED ORDER — INSULIN ASPART 100 UNIT/ML ~~LOC~~ SOLN: 0.0000 [IU] | Freq: Every day | SUBCUTANEOUS | Status: DC

## 2019-03-06 MED ORDER — INSULIN ASPART 100 UNIT/ML ~~LOC~~ SOLN
0.0000 [IU] | Freq: Three times a day (TID) | SUBCUTANEOUS | Status: DC
  Administered 2019-03-07 – 2019-03-09 (×2): 1 [IU] via SUBCUTANEOUS

## 2019-03-06 MED ORDER — LISINOPRIL 10 MG PO TABS
10.0000 mg | ORAL_TABLET | Freq: Every day | ORAL | Status: DC
  Administered 2019-03-06 – 2019-03-08 (×3): 10 mg via ORAL
  Filled 2019-03-06 (×3): qty 1

## 2019-03-06 MED ORDER — AMLODIPINE BESYLATE 5 MG PO TABS
5.0000 mg | ORAL_TABLET | Freq: Every day | ORAL | Status: DC
  Administered 2019-03-06 – 2019-03-08 (×3): 5 mg via ORAL
  Filled 2019-03-06 (×3): qty 1

## 2019-03-06 MED ORDER — ROPINIROLE HCL 0.25 MG PO TABS
0.2500 mg | ORAL_TABLET | Freq: Every day | ORAL | Status: DC
  Administered 2019-03-06 – 2019-03-09 (×4): 0.25 mg via ORAL
  Filled 2019-03-06 (×5): qty 1

## 2019-03-06 MED ORDER — POTASSIUM CHLORIDE CRYS ER 20 MEQ PO TBCR
40.0000 meq | EXTENDED_RELEASE_TABLET | Freq: Once | ORAL | Status: AC
  Administered 2019-03-06: 40 meq via ORAL
  Filled 2019-03-06: qty 2

## 2019-03-06 NOTE — Evaluation (Signed)
Physical Therapy Evaluation Patient Details Name: Kristina Wilkinson MRN: [id number] DOB: 11/13/1927 Today's Date: 03/06/2019   History of Present Illness  Patient is a 86 y/o female with PMH of DM 2, HTN, hx of DVT presenting with fall at home with prolonged downtime (R LE found flexed back and outwards).  Found with sepsis due to UTI and mild rhabdomyolysis.  Imanaging to R hip and knee negative, CT spine negative and CT head negative.   Clinical Impression  Pt is in a great deal of discomfort on RLE but also has significant redness on the lower leg with a laceration on the calf.  Pt requires max assist to bedside and total back to bed mostly for lifting legs.  Will recommend SNF since she is not able to get OOB and help stand, and requires dense cues for all mobility along with giving limited history, other than to state she has had mult falls.  Follow acutely to work on these limitations of ROM, strength and balance control to assist resumption of gait and transfers.      Follow Up Recommendations SNF    Equipment Recommendations  None recommended by PT    Recommendations for Other Services       Precautions / Restrictions Precautions Precautions: Fall Precaution Comments: impaired cognition, skin breakdown Restrictions Weight Bearing Restrictions: No      Mobility  Bed Mobility Overal bed mobility: Needs Assistance Bed Mobility: Supine to Sit;Sit to Supine     Supine to sit: Max assist Sit to supine: Total assist   General bed mobility comments: heavy lift of legs to get into bed, pt made minimal effort to help  Transfers Overall transfer level: Needs assistance               General transfer comment: pt could not do  Ambulation/Gait             General Gait Details: unable  Stairs            Wheelchair Mobility    Modified Rankin (Stroke Patients Only)       Balance Overall balance assessment: History of Falls;Needs assistance Sitting-balance  support: Feet supported;Bilateral upper extremity supported Sitting balance-Leahy Scale: Poor Sitting balance - Comments: required PT to support back then asked to return to bed                                     Pertinent Vitals/Pain Pain Assessment: Faces Faces Pain Scale: Hurts whole lot Pain Location: with movement of legs to side of bed Pain Descriptors / Indicators: Discomfort;Grimacing;Tender Pain Intervention(s): Monitored during session;Limited activity within patient's tolerance;Repositioned    Home Living Family/patient expects to be discharged to:: Private residence Living Arrangements: Alone Available Help at Discharge: Family;Available PRN/intermittently Type of Home: House Home Access: Stairs to enter Entrance Stairs-Rails: Right;Left;Can reach both Entrance Stairs-Number of Steps: 3-4 Home Layout: One level Home Equipment: Grab bars - tub/shower;Shower seat - built in;Cane - single point;Walker - 2 wheels Additional Comments: per chart review, patient unable to provide PLOF     Prior Function Level of Independence: Independent with assistive device(s)         Comments: per chart review, anticipate independent as patient reports living alone      Hand Dominance   Dominant Hand: Right    Extremity/Trunk Assessment   Upper Extremity Assessment Upper Extremity Assessment: Defer to OT evaluation  Lower Extremity Assessment Lower Extremity Assessment: Generalized weakness    Cervical / Trunk Assessment Cervical / Trunk Assessment: Kyphotic  Communication   Communication: HOH  Cognition Arousal/Alertness: Lethargic(improved during session) Behavior During Therapy: Flat affect Overall Cognitive Status: Impaired/Different from baseline Area of Impairment: Orientation;Attention;Memory;Following commands;Safety/judgement;Awareness;Problem solving                 Orientation Level: Disoriented to;Time;Place(Knew she was at the  hospital) Current Attention Level: Selective Memory: Decreased recall of precautions;Decreased short-term memory Following Commands: Follows one step commands inconsistently;Follows one step commands with increased time Safety/Judgement: Decreased awareness of safety;Decreased awareness of deficits Awareness: Intellectual Problem Solving: Slow processing;Decreased initiation;Difficulty sequencing;Requires verbal cues;Requires tactile cues General Comments: patient disoriented, following minimal commands with increased time and multimodal cueing       General Comments General comments (skin integrity, edema, etc.): pt could not recall who she had spoken to on the phone from a few minutes before PT arrived    Exercises     Assessment/Plan    PT Assessment Patient needs continued PT services  PT Problem List Decreased strength;Decreased range of motion;Decreased activity tolerance;Decreased balance;Decreased mobility;Decreased coordination;Decreased cognition;Decreased knowledge of use of DME;Decreased safety awareness;Decreased knowledge of precautions;Cardiopulmonary status limiting activity;Obesity;Decreased skin integrity;Pain       PT Treatment Interventions DME instruction;Gait training;Functional mobility training;Therapeutic activities;Therapeutic exercise;Balance training;Neuromuscular re-education;Patient/family education    PT Goals (Current goals can be found in the Care Plan section)  Acute Rehab PT Goals Patient Stated Goal: none stated PT Goal Formulation: Patient unable to participate in goal setting Time For Goal Achievement: 03/20/19 Potential to Achieve Goals: Good    Frequency Min 2X/week   Barriers to discharge Inaccessible home environment;Decreased caregiver support dependent to get OOB    Co-evaluation               AM-PAC PT "6 Clicks" Mobility  Outcome Measure Help needed turning from your back to your side while in a flat bed without using  bedrails?: A Lot Help needed moving from lying on your back to sitting on the side of a flat bed without using bedrails?: A Lot Help needed moving to and from a bed to a chair (including a wheelchair)?: Total Help needed standing up from a chair using your arms (e.g., wheelchair or bedside chair)?: Total Help needed to walk in hospital room?: Total Help needed climbing 3-5 steps with a railing? : Total 6 Click Score: 8    End of Session   Activity Tolerance: Patient limited by fatigue;Treatment limited secondary to medical complications (Comment);Patient limited by pain Patient left: in bed;with call bell/phone within reach;with bed alarm set;with restraints reapplied Nurse Communication: Mobility status;Other (comment)(told nurse pt had tray and she let PT undo restraints for pt) PT Visit Diagnosis: Muscle weakness (generalized) (M62.81);Difficulty in walking, not elsewhere classified (R26.2);History of falling (Z91.81);Adult, failure to thrive (R62.7);Pain Pain - Right/Left: Right Pain - part of body: Leg    Time: 1140-1207 PT Time Calculation (min) (ACUTE ONLY): 27 min   Charges:   PT Evaluation $PT Eval Moderate Complexity: 1 Mod PT Treatments $Gait Training: 8-22 mins       Ramond Dial 03/06/2019, 1:02 PM   Mee Hives, PT MS Acute Rehab Dept. Number: [place] and [place]

## 2019-03-06 NOTE — Progress Notes (Signed)
PROGRESS NOTE    Kristina Wilkinson  HQP:[id number] DOB: Apr 11, 1927 DOA: 03/05/2019 PCP: Elby Beck, FNP   Brief Narrative:  HPI: Kristina Wilkinson is a 86 y.o. female with medical history significant for diet-controlled type 2 diabetes, hypertension, and history of DVT who presents to the ED for evaluation after a fall at home with prolonged downtime.  History limited from patient due to encephalopathy therefore supplemented by EDP and chart review.  Patient reportedly lives alone and was seen last well around 4:30 PM on 03/04/2019.  She says she fell sometime later that evening and spent the night lying on the floor.  Her niece went to her home to check on her and found her on the ground.  EMS were called and per ED documentation when she was found her right leg was flexed back behind her and outwards.  She was realigned and placed on a backboard and brought to the ED for further evaluation.  ED Course:  Initial vitals showed BP 174/77, pulse 75, RR 15, temp 95.6 Fahrenheit, SPO2 98% on room air.  Labs are notable for sodium 139, potassium 4.4, bicarb 20, BUN 30, creatinine 0.82, AST 127, ALT 46, alk phos 91, total bilirubin 1.2, WBC 21.1, hemoglobin 13.8, platelets 193,000, CK 3107, magnesium 2.0, phosphorus 3.0, lipase 14, lactic acid 1.7, high-sensitivity troponin I 69 > 65.  Urinalysis shows positive nitrites, trace leukocytes, 11-20 WBCs, 0-5 RBCs, many bacteria microscopy.  Urine culture was obtained and pending.  Right hip x-ray is negative for acute injury of the right hip.  Right knee x-ray shows right knee replacement without evidence of hardware loosening or infection.  CT head without contrast is negative for acute intracranial abnormalities or skull fracture.  CT cervical spine without contrast is negative for fracture or acute finding.  CT chest/abdomen/pelvis with contrast negative for acute/traumatic intrathoracic, abdominal, or pelvic pathology.  Thickened  appearance of endometrium is noted.  Patient was given 1.25 L normal saline, IV morphine, and IV ceftriaxone.  Hospitalist service was consulted to admit for further evaluation and management.  Assessment & Plan:   Principal Problem:   Sepsis due to urinary tract infection [facility]) Active Problems:   Essential hypertension   Diabetes mellitus without complication ([place])   Fall at home   Sepsis due to gram-negative UTI ([place])   Sepsis due to UTI: Patient met sepsis criteria due to hypothermia and leukocytosis and urinalysis suggestive of UTI.  Continue IV Rocephin.  Follow culture and tailor antibiotics accordingly.  Acute metabolic encephalopathy: Likely secondary to UTI/sepsis and rhabdomyolysis.  Treat underlying cause.  CT head negative.  No focal deficit.  Will watch closely and if indicated, obtain MRI of head but hold off for now.  Fall at home with prolonged downtime and mild rhabdomyolysis: Continue IV fluids.  PT OT on board.  Repeat CK in the morning.  Will likely need SNF.  Essential hypertension: Blood pressure elevated.  Resume home antihypertensives.  Elevated troponin: Minimally elevated troponin 69 > 65.  No chest pain or shortness of breath. Likely demand ischemia from sepsis and mild rhabdomyolysis.  Monitor on telemetry.  Diet-controlled type 2 diabetes: Not on any home medications.  Last hemoglobin A1c done in October 2020 was 6.2.  Place him on sliding scale insulin.  Endometrial wall thickening: Incidental finding on the CT abdomen and pelvis.  Will need nonemergent/outpatient pelvic ultrasound.  DVT prophylaxis: Lovenox Code Status: Full code Family Communication:  None present at bedside.  Disposition Plan: Potential  discharge to SNF once medically improved and placement arranged.  Potentially in 2 to 3 days.  Estimated body mass index is 30.72 kg/m as calculated from the following:   Height as of 04/24/17: 5' 1.5" (1.562 m).   Weight as of 05/01/17: 75  kg.      Nutritional status:               Consultants:   None  Procedures:   None  Antimicrobials:   IV Rocephin   Subjective: Seen and examined.  Patient is alert but pleasantly confused.  No complaints.  Objective: Vitals:   03/05/19 2000 03/05/19 2106 03/06/19 0606 03/06/19 0936  BP: (!) 163/49 (!) 169/69 (!) 166/68 135/77  Pulse: 100 88 98 85  Resp: 18 18 20 20   Temp:  98 F (36.7 C) (!) 97.5 F (36.4 C) 98.4 F (36.9 C)  TempSrc:  Oral Oral Oral  SpO2: 96% 96% 94% 100%    Intake/Output Summary (Last 24 hours) at 03/06/2019 1354 Last data filed at 03/06/2019 1300 Gross per 24 hour  Intake 1464.13 ml  Output 0 ml  Net 1464.13 ml   There were no vitals filed for this visit.  Examination:  General exam: Appears calm and comfortable  Respiratory system: Clear to auscultation. Respiratory effort normal. Cardiovascular system: S1 & S2 heard, RRR. No JVD, murmurs, rubs, gallops or clicks. No pedal edema. Gastrointestinal system: Abdomen is nondistended, soft and nontender. No organomegaly or masses felt. Normal bowel sounds heard. Central nervous system: Alert but confused. No focal neurological deficits. Extremities: Symmetric 5 x 5 power. Skin: No rashes, lesions or ulcers Psychiatry: Confused   Data Reviewed: I have personally reviewed following labs and imaging studies  CBC: Recent Labs  Lab 03/05/19 1445 03/05/19 1453 03/06/19 0440  WBC 21.1*  --  17.5*  NEUTROABS 18.2*  --   --   HGB 13.8 13.9 11.7*  HCT 42.8 41.0 35.9*  MCV 92.0  --  91.8  PLT 193  --  979*   Basic Metabolic Panel: Recent Labs  Lab 03/05/19 1445 03/05/19 1453 03/06/19 0440  NA 139 142 142  K 4.4 3.6 3.2*  CL 106  --  113*  CO2 20*  --  17*  GLUCOSE 150*  --  119*  BUN 30*  --  26*  CREATININE 0.82  --  0.71  CALCIUM 10.1  --  8.9  MG 2.0  --   --   PHOS 3.0  --   --    GFR: CrCl cannot be calculated (Unknown ideal weight.). Liver Function  Tests: Recent Labs  Lab 03/05/19 1445 03/06/19 0440  AST 127* 111*  ALT 46* 48*  ALKPHOS 91 66  BILITOT 1.2 1.0  PROT 6.3* 4.9*  ALBUMIN 3.9 2.9*   Recent Labs  Lab 03/05/19 1445  LIPASE 14   No results for input(s): AMMONIA in the last 168 hours. Coagulation Profile: Recent Labs  Lab 03/05/19 1635  INR 1.1   Cardiac Enzymes: Recent Labs  Lab 03/05/19 1445 03/06/19 0440  CKTOTAL 3,107* 1,655*   BNP (last 3 results) No results for input(s): PROBNP in the last 8760 hours. HbA1C: No results for input(s): HGBA1C in the last 72 hours. CBG: Recent Labs  Lab 03/05/19 1436 03/05/19 2107 03/06/19 0702 03/06/19 1156  GLUCAP 142* 111* 101* 112*   Lipid Profile: No results for input(s): CHOL, HDL, LDLCALC, TRIG, CHOLHDL, LDLDIRECT in the last 72 hours. Thyroid Function Tests: No results for input(s): TSH,  T4TOTAL, FREET4, T3FREE, THYROIDAB in the last 72 hours. Anemia Panel: No results for input(s): VITAMINB12, FOLATE, FERRITIN, TIBC, IRON, RETICCTPCT in the last 72 hours. Sepsis Labs: Recent Labs  Lab 03/05/19 1445 03/05/19 1635  LATICACIDVEN 1.7 1.8    Recent Results (from the past 240 hour(s))  SARS CORONAVIRUS 2 (TAT 6-24 HRS) Nasopharyngeal Nasopharyngeal Swab     Status: None   Collection Time: 03/05/19  6:43 PM   Specimen: Nasopharyngeal Swab  Result Value Ref Range Status   SARS Coronavirus 2 NEGATIVE NEGATIVE Final    Comment: (NOTE) SARS-CoV-2 target nucleic acids are NOT DETECTED. The SARS-CoV-2 RNA is generally detectable in upper and lower respiratory specimens during the acute phase of infection. Negative results do not preclude SARS-CoV-2 infection, do not rule out co-infections with other pathogens, and should not be used as the sole basis for treatment or other patient management decisions. Negative results must be combined with clinical observations, patient history, and epidemiological information. The expected result is Negative. Fact  Sheet for Patients: SugarRoll.be Fact Sheet for Healthcare Providers: https://www.woods-mathews.com/ This test is not yet approved or cleared by the [place] FDA and  has been authorized for detection and/or diagnosis of SARS-CoV-2 by FDA under an Emergency Use Authorization (EUA). This EUA will remain  in effect (meaning this test can be used) for the duration of the COVID-19 declaration under Section 56 4(b)(1) of the Act, 21 U.S.C. section 360bbb-3(b)(1), unless the authorization is terminated or revoked sooner. Performed at [facility], [place] [address], [place], [place] [address]       Radiology Studies: DG Knee 2 Views Right  Result Date: 03/05/2019 CLINICAL DATA:  Status post fall. EXAM: RIGHT KNEE - 1-2 VIEW COMPARISON:  None. FINDINGS: There is a right knee replacement without evidence of surrounding lucency to suggest the presence of hardware loosening or infection. No evidence of fracture, dislocation, or joint effusion. Soft tissues are unremarkable. IMPRESSION: Right knee replacement without evidence of hardware loosening or infection. Electronically Signed   By: Virgina Norfolk M.D.   On: 03/05/2019 15:35   CT Head Wo Contrast  Result Date: 03/05/2019 CLINICAL DATA:  Fall occurring yesterday. Patient found lying on the floor. Headache. EXAM: CT HEAD WITHOUT CONTRAST CT CERVICAL SPINE WITHOUT CONTRAST TECHNIQUE: Multidetector CT imaging of the head and cervical spine was performed following the standard protocol without intravenous contrast. Multiplanar CT image reconstructions of the cervical spine were also generated. COMPARISON:  11/30/2018 FINDINGS: CT HEAD FINDINGS Brain: No evidence of acute infarction, hemorrhage, hydrocephalus, extra-axial collection or mass lesion/mass effect. There are changes from a previous occipital craniotomy. Metal vascular clips and possible coil material as well as calcifications are noted along  the foramen magnum, stable compared to the prior head CT. Mild periventricular white matter hypoattenuation is noted consistent with chronic microvascular ischemic change. Vascular: Fairly prominent carotid calcifications. No hyperattenuating artery. Skull: No acute fracture or bone lesion. Sinuses/Orbits: Globes and orbits are unremarkable. Sinuses and mastoid air cells are clear. Other: None. CT CERVICAL SPINE FINDINGS Alignment: Normal. Skull base and vertebrae: No acute fracture. No primary bone lesion or focal pathologic process. Stable surgical changes from a previous suboccipital craniotomy. Soft tissues and spinal canal: No prevertebral fluid or swelling. No visible canal hematoma. Disc levels: Spondylotic disc bulging throughout the cervical spine. Moderate loss of disc height at C4-C5 and C5-C6 with moderate to severe loss of disc height at C6-C7. No convincing disc herniation. Upper chest: No acute findings.  Scarring  in the lung apices. Other: None. IMPRESSION: HEAD CT 1. No acute intracranial abnormalities. 2. No skull fracture. CERVICAL CT 1. No fracture or acute finding. Electronically Signed   By: Lajean Manes M.D.   On: 03/05/2019 17:22   CT Chest W Contrast  Result Date: 03/05/2019 CLINICAL DATA:  86 year old female with trauma. EXAM: CT CHEST, ABDOMEN, AND PELVIS WITH CONTRAST TECHNIQUE: Multidetector CT imaging of the chest, abdomen and pelvis was performed following the standard protocol during bolus administration of intravenous contrast. CONTRAST:  155m OMNIPAQUE IOHEXOL 300 MG/ML  SOLN COMPARISON:  Chest radiograph dated 11/30/2018. FINDINGS: Evaluation of this exam is limited due to respiratory motion artifact. CT CHEST FINDINGS Cardiovascular: There is no cardiomegaly or pericardial effusion. Three-vessel coronary vascular calcification. There is calcification of the mitral annulus. There is moderate atherosclerotic calcification of the thoracic aorta. No aneurysmal dilatation or  dissection. The origins of the great vessels of the aortic arch appear patent as visualized. A the central pulmonary arteries are grossly unremarkable for the degree of opacification. Mediastinum/Nodes: There is no hilar or mediastinal adenopathy. The esophagus is grossly unremarkable. No mediastinal fluid collection. There is an 8 mm right thyroid hypodense nodule. Lungs/Pleura: Bilateral lower lobe linear atelectasis/scarring noted. Confluent areas of hazy density throughout the lungs likely represents areas of atelectatic changes or air trapping related to underlying small vessel versus small airway disease. No lobar consolidation, pleural effusion, or pneumothorax. The central airways are patent. Musculoskeletal: No acute osseous pathology. Osteopenia with degenerative changes of the spine. A focus of sclerosis involving the left posterior seventh rib at the costovertebral junction likely a bone island. CT ABDOMEN PELVIS FINDINGS No intra-abdominal free air or free fluid. Hepatobiliary: The liver is unremarkable. No intrahepatic biliary ductal dilatation. Cholecystectomy. Mildly dilated common bile duct, likely post cholecystectomy. No retained calcified stone noted in the central CBD. Pancreas: Unremarkable. No pancreatic ductal dilatation or surrounding inflammatory changes. Spleen: Normal in size without focal abnormality. Adrenals/Urinary Tract: The adrenal glands are unremarkable. There is no hydronephrosis on either side. There is symmetric enhancement and excretion of contrast by both kidneys. The visualized ureters and urinary bladder appear unremarkable. Stomach/Bowel: There is sigmoid diverticulosis without active inflammatory changes. There is no bowel obstruction or active inflammation. The appendix is not visualized with certainty. No inflammatory changes identified in the right lower quadrant. Vascular/Lymphatic: Moderate aortoiliac atherosclerotic disease. The IVC is grossly unremarkable. No  portal venous gas. There is no adenopathy. Reproductive: The uterus is anteverted. There is mild thickened appearance of the endometrium measuring approximately 7 mm in thickness. Dedicated pelvic ultrasound is recommended for better evaluation on a nonemergent/outpatient basis. No adnexal masses identified. Other: There is a 3 cm umbilical hernia with protrusion of anterior wall of a segment of colon. No obstruction Musculoskeletal: There is osteopenia with degenerative changes of the spine. Grade 1 L5-S1 retrolisthesis. No acute osseous pathology. IMPRESSION: 1. No acute/traumatic intrathoracic, abdominal, or pelvic pathology. 2. Thickened appearance of the endometrium. Further evaluation with pelvic ultrasound on a nonemergent/outpatient basis recommended. 3.  Aortic Atherosclerosis (ICD10-I70.0). Electronically Signed   By: AAnner CreteM.D.   On: 03/05/2019 17:35   CT Cervical Spine Wo Contrast  Result Date: 03/05/2019 CLINICAL DATA:  Fall occurring yesterday. Patient found lying on the floor. Headache. EXAM: CT HEAD WITHOUT CONTRAST CT CERVICAL SPINE WITHOUT CONTRAST TECHNIQUE: Multidetector CT imaging of the head and cervical spine was performed following the standard protocol without intravenous contrast. Multiplanar CT image reconstructions of the cervical spine were also  generated. COMPARISON:  11/30/2018 FINDINGS: CT HEAD FINDINGS Brain: No evidence of acute infarction, hemorrhage, hydrocephalus, extra-axial collection or mass lesion/mass effect. There are changes from a previous occipital craniotomy. Metal vascular clips and possible coil material as well as calcifications are noted along the foramen magnum, stable compared to the prior head CT. Mild periventricular white matter hypoattenuation is noted consistent with chronic microvascular ischemic change. Vascular: Fairly prominent carotid calcifications. No hyperattenuating artery. Skull: No acute fracture or bone lesion. Sinuses/Orbits:  Globes and orbits are unremarkable. Sinuses and mastoid air cells are clear. Other: None. CT CERVICAL SPINE FINDINGS Alignment: Normal. Skull base and vertebrae: No acute fracture. No primary bone lesion or focal pathologic process. Stable surgical changes from a previous suboccipital craniotomy. Soft tissues and spinal canal: No prevertebral fluid or swelling. No visible canal hematoma. Disc levels: Spondylotic disc bulging throughout the cervical spine. Moderate loss of disc height at C4-C5 and C5-C6 with moderate to severe loss of disc height at C6-C7. No convincing disc herniation. Upper chest: No acute findings.  Scarring in the lung apices. Other: None. IMPRESSION: HEAD CT 1. No acute intracranial abnormalities. 2. No skull fracture. CERVICAL CT 1. No fracture or acute finding. Electronically Signed   By: Lajean Manes M.D.   On: 03/05/2019 17:22   CT Abdomen Pelvis W Contrast  Result Date: 03/05/2019 CLINICAL DATA:  86 year old female with trauma. EXAM: CT CHEST, ABDOMEN, AND PELVIS WITH CONTRAST TECHNIQUE: Multidetector CT imaging of the chest, abdomen and pelvis was performed following the standard protocol during bolus administration of intravenous contrast. CONTRAST:  115m OMNIPAQUE IOHEXOL 300 MG/ML  SOLN COMPARISON:  Chest radiograph dated 11/30/2018. FINDINGS: Evaluation of this exam is limited due to respiratory motion artifact. CT CHEST FINDINGS Cardiovascular: There is no cardiomegaly or pericardial effusion. Three-vessel coronary vascular calcification. There is calcification of the mitral annulus. There is moderate atherosclerotic calcification of the thoracic aorta. No aneurysmal dilatation or dissection. The origins of the great vessels of the aortic arch appear patent as visualized. A the central pulmonary arteries are grossly unremarkable for the degree of opacification. Mediastinum/Nodes: There is no hilar or mediastinal adenopathy. The esophagus is grossly unremarkable. No mediastinal  fluid collection. There is an 8 mm right thyroid hypodense nodule. Lungs/Pleura: Bilateral lower lobe linear atelectasis/scarring noted. Confluent areas of hazy density throughout the lungs likely represents areas of atelectatic changes or air trapping related to underlying small vessel versus small airway disease. No lobar consolidation, pleural effusion, or pneumothorax. The central airways are patent. Musculoskeletal: No acute osseous pathology. Osteopenia with degenerative changes of the spine. A focus of sclerosis involving the left posterior seventh rib at the costovertebral junction likely a bone island. CT ABDOMEN PELVIS FINDINGS No intra-abdominal free air or free fluid. Hepatobiliary: The liver is unremarkable. No intrahepatic biliary ductal dilatation. Cholecystectomy. Mildly dilated common bile duct, likely post cholecystectomy. No retained calcified stone noted in the central CBD. Pancreas: Unremarkable. No pancreatic ductal dilatation or surrounding inflammatory changes. Spleen: Normal in size without focal abnormality. Adrenals/Urinary Tract: The adrenal glands are unremarkable. There is no hydronephrosis on either side. There is symmetric enhancement and excretion of contrast by both kidneys. The visualized ureters and urinary bladder appear unremarkable. Stomach/Bowel: There is sigmoid diverticulosis without active inflammatory changes. There is no bowel obstruction or active inflammation. The appendix is not visualized with certainty. No inflammatory changes identified in the right lower quadrant. Vascular/Lymphatic: Moderate aortoiliac atherosclerotic disease. The IVC is grossly unremarkable. No portal venous gas. There is no adenopathy.  Reproductive: The uterus is anteverted. There is mild thickened appearance of the endometrium measuring approximately 7 mm in thickness. Dedicated pelvic ultrasound is recommended for better evaluation on a nonemergent/outpatient basis. No adnexal masses  identified. Other: There is a 3 cm umbilical hernia with protrusion of anterior wall of a segment of colon. No obstruction Musculoskeletal: There is osteopenia with degenerative changes of the spine. Grade 1 L5-S1 retrolisthesis. No acute osseous pathology. IMPRESSION: 1. No acute/traumatic intrathoracic, abdominal, or pelvic pathology. 2. Thickened appearance of the endometrium. Further evaluation with pelvic ultrasound on a nonemergent/outpatient basis recommended. 3.  Aortic Atherosclerosis (ICD10-I70.0). Electronically Signed   By: Anner Crete M.D.   On: 03/05/2019 17:35   DG Hip Unilat With Pelvis 2-3 Views Right  Result Date: 03/05/2019 CLINICAL DATA:  Status post fall yesterday. EXAM: DG HIP (WITH OR WITHOUT PELVIS) 2-3V RIGHT COMPARISON:  None. FINDINGS: Generalized osteopenia. No fracture or dislocation. No aggressive osseous lesion. Mild osteoarthritis bilateral SI joints. IMPRESSION: No acute osseous injury of the right hip. Given the patient's age and osteopenia, if there is persistent clinical concern for an occult hip fracture, a MRI of the hip is recommended for increased sensitivity. Electronically Signed   By: Kathreen Devoid   On: 03/05/2019 15:34    Scheduled Meds:  amLODipine  5 mg Oral Daily   enoxaparin (LOVENOX) injection  40 mg Subcutaneous Q24H   lisinopril  10 mg Oral Daily   rOPINIRole  0.25 mg Oral QHS   sodium chloride flush  3 mL Intravenous Q12H   Continuous Infusions:  cefTRIAXone (ROCEPHIN)  IV       LOS: 0 days   Time spent: 35 minutes   Darliss Cheney, MD [facility]  03/06/2019, 1:54 PM   To contact the attending provider between 7A-7P or the covering provider during after hours 7P-7A, please log into the web site [email or web address].

## 2019-03-06 NOTE — Evaluation (Signed)
Occupational Therapy Evaluation Patient Details Name: Kristina Wilkinson MRN: [id number] DOB: May 21, 1927 Today's Date: 03/06/2019    History of Present Illness Patient is a 86 y/o female with PMH of DM 2, HTN, hx of DVT presenting with fall at home with prolonged downtime (R LE found flexed back and outwards).  Found with sepsis due to UTI and mild rhabdomyolysis.  Imanaging to R hip and knee negative, CT spine negative and CT head negative.    Clinical Impression   PTA patient reports independent and living alone. Admitted for above and limited by problem list below, including R LE pain, impaired cognition, generalized weakness, and decreased activity tolerance.  Patient repositioned in bed, minimal movement of R LE causing pain, deferred further mobility due to cognition and pain; requires max assist for grooming bed level, and total assist for remainder of ADLs. Following simple 1 step commands given multimodal cueing and increased time, but inconsistently; she is disoriented and therapist re-oriented during session. Based on performance today, she will requires 24/7 support and recommend SNF level rehab.  Will follow acutely.     Follow Up Recommendations  SNF;Supervision/Assistance - 24 hour    Equipment Recommendations  3 in 1 bedside commode    Recommendations for Other Services       Precautions / Restrictions Precautions Precautions: Fall Precaution Comments: impaired cognition Restrictions Weight Bearing Restrictions: No      Mobility Bed Mobility Overal bed mobility: Needs Assistance             General bed mobility comments: attempted rolling in bed to transition to EOB, patient unable to follow commands to tolerate movement of R LE due to pain therefore therapist deferred   Transfers                 General transfer comment: deferred    Balance                                           ADL either performed or assessed with clinical  judgement   ADL Overall ADL's : Needs assistance/impaired     Grooming: Maximal assistance;Bed level;Wash/dry hands;Wash/dry face Grooming Details (indicate cue type and reason): requires hand over hand support to initaite, multimodal cueing to sustain attention and complete tasks                                General ADL Comments: total assistance for remainder of ADLs due to cognition     Vision   Additional Comments: unable to follow commands, further assessment     Perception     Praxis      Pertinent Vitals/Pain Pain Assessment: Faces Faces Pain Scale: Hurts even more Pain Location: with minimal movement of R LE  Pain Descriptors / Indicators: Discomfort;Grimacing Pain Intervention(s): Monitored during session;Repositioned;Limited activity within patient's tolerance     Hand Dominance Right   Extremity/Trunk Assessment Upper Extremity Assessment Upper Extremity Assessment: Generalized weakness   Lower Extremity Assessment Lower Extremity Assessment: Defer to PT evaluation       Communication Communication Communication: HOH   Cognition Arousal/Alertness: Lethargic(improved during session) Behavior During Therapy: Flat affect Overall Cognitive Status: Impaired/Different from baseline Area of Impairment: Orientation;Attention;Memory;Following commands;Safety/judgement;Awareness;Problem solving                 Orientation Level: Disoriented  to;Person;Time;Place;Situation Current Attention Level: Focused Memory: Decreased recall of precautions;Decreased short-term memory Following Commands: Follows one step commands inconsistently;Follows one step commands with increased time Safety/Judgement: Decreased awareness of safety;Decreased awareness of deficits Awareness: Intellectual Problem Solving: Slow processing;Decreased initiation;Difficulty sequencing;Requires verbal cues;Requires tactile cues General Comments: patient disoriented,  following minimal commands with increased time and multimodal cueing    General Comments  RN present in room     Exercises     Shoulder Instructions      Home Living Family/patient expects to be discharged to:: Private residence Living Arrangements: Alone Available Help at Discharge: Family;Available PRN/intermittently Type of Home: House Home Access: Stairs to enter [employer] of Steps: 3-4 Entrance Stairs-Rails: Right;Left;Can reach both Home Layout: One level     Bathroom Shower/Tub: Walk-in shower;Tub/shower unit   Bathroom Toilet: Handicapped height     Home Equipment: Grab bars - tub/shower;Shower seat - built in;Cane - single point;Walker - 2 wheels   Additional Comments: per chart review, patient unable to provide PLOF       Prior Functioning/Environment Level of Independence: Independent with assistive device(s)        Comments: per chart review, anticipate independent as patient reports living alone         OT Problem List: Decreased strength;Decreased activity tolerance;Impaired balance (sitting and/or standing);Decreased range of motion;Decreased coordination;Decreased cognition;Decreased safety awareness;Decreased knowledge of precautions;Decreased knowledge of use of DME or AE;Pain      OT Treatment/Interventions: Self-care/ADL training;Therapeutic exercise;DME and/or AE instruction;Therapeutic activities;Cognitive remediation/compensation;Balance training;Patient/family education    OT Goals(Current goals can be found in the care plan section) Acute Rehab OT Goals Patient Stated Goal: none stated OT Goal Formulation: Patient unable to participate in goal setting Time For Goal Achievement: 03/20/19 Potential to Achieve Goals: Good  OT Frequency: Min 2X/week   Barriers to D/C: Decreased caregiver support          Co-evaluation              AM-PAC OT "6 Clicks" Daily Activity     Outcome Measure Help from another person eating  meals?: A Lot Help from another person taking care of personal grooming?: A Lot Help from another person toileting, which includes using toliet, bedpan, or urinal?: Total Help from another person bathing (including washing, rinsing, drying)?: Total Help from another person to put on and taking off regular upper body clothing?: Total Help from another person to put on and taking off regular lower body clothing?: Total 6 Click Score: 8   End of Session Nurse Communication: Mobility status  Activity Tolerance: Patient limited by pain;Patient limited by fatigue Patient left: in bed;with call bell/phone within reach;with bed alarm set;with restraints reapplied;with nursing/sitter in room  OT Visit Diagnosis: Other abnormalities of gait and mobility (R26.89);Muscle weakness (generalized) (M62.81);Pain;Other symptoms and signs involving cognitive function;History of falling (Z91.81) Pain - Right/Left: Right Pain - part of body: Knee;Leg                Time: [id number] OT Time Calculation (min): 19 min Charges:  OT General Charges $OT Visit: 1 Visit OT Evaluation $OT Eval Moderate Complexity: 1 Mod  Kristina Wilkinson, OT Acute Rehabilitation Services Pager [phone] Office [phone]    Kristina Wilkinson 03/06/2019, 12:19 PM

## 2019-03-06 NOTE — Progress Notes (Signed)
New Admission Note: ? Arrival Method: via stretcher  Mental Orientation: Alert x 1, to self Telemetry: Box #19 NSR Assessment: Completed Skin: Refer to flowsheet IV: Right FA infusing  Pain: 0/10 Tubes: Safety Measures: Safety Fall Prevention Plan discussed with patient. Admission: Completed 5 [place] Orientation: Patient has been orientated to the room, unit and the staff. Family: Orders have been reviewed and are being implemented. Will continue to monitor the patient. Call light has been placed within reach and bed alarm has been activated.  ? [employer], [place]

## 2019-03-06 NOTE — Plan of Care (Signed)
°  Problem: Coping: °Goal: Level of anxiety will decrease °Outcome: Progressing °  °

## 2019-03-07 LAB — URINE CULTURE: Culture: >=100000 — AB

## 2019-03-07 LAB — COMPREHENSIVE METABOLIC PANEL
ALT: 48 U/L — ABNORMAL HIGH (ref 0–44)
AST: 73 U/L — ABNORMAL HIGH (ref 15–41)
Albumin: 2.7 g/dL — ABNORMAL LOW (ref 3.5–5.0)
Alkaline Phosphatase: 68 U/L (ref 38–126)
Anion gap: 10 (ref 5–15)
BUN: 27 mg/dL — ABNORMAL HIGH (ref 8–23)
CO2: 19 mmol/L — ABNORMAL LOW (ref 22–32)
Calcium: 9 mg/dL (ref 8.9–10.3)
Chloride: 113 mmol/L — ABNORMAL HIGH (ref 98–111)
Creatinine, Ser: 0.7 mg/dL (ref 0.44–1.00)
GFR calc Af Amer: >60 mL/min (ref >60)
GFR calc non Af Amer: >60 mL/min (ref >60)
Glucose, Bld: 115 mg/dL — ABNORMAL HIGH (ref 70–99)
Potassium: 3.1 mmol/L — ABNORMAL LOW (ref 3.5–5.1)
Sodium: 142 mmol/L (ref 135–145)
Total Bilirubin: 1 mg/dL (ref 0.3–1.2)
Total Protein: 5 g/dL — ABNORMAL LOW (ref 6.5–8.1)

## 2019-03-07 LAB — CBC
HCT: 34.9 % — ABNORMAL LOW (ref 36.0–46.0)
Hemoglobin: 11.5 g/dL — ABNORMAL LOW (ref 12.0–15.0)
MCH: 30.1 pg (ref 26.0–34.0)
MCHC: 33 g/dL (ref 30.0–36.0)
MCV: 91.4 fL (ref 80.0–100.0)
Platelets: 128 10*3/uL — ABNORMAL LOW (ref 150–400)
RBC: 3.82 MIL/uL — ABNORMAL LOW (ref 3.87–5.11)
RDW: 13.2 % (ref 11.5–15.5)
WBC: 11.4 10*3/uL — ABNORMAL HIGH (ref 4.0–10.5)
nRBC: 0 % (ref 0.0–0.2)

## 2019-03-07 LAB — GLUCOSE, CAPILLARY
Glucose-Capillary: 97 mg/dL (ref 70–99)
Glucose-Capillary: 162 mg/dL — ABNORMAL HIGH (ref 70–99)
Glucose-Capillary: 118 mg/dL — ABNORMAL HIGH (ref 70–99)
Glucose-Capillary: 129 mg/dL — ABNORMAL HIGH (ref 70–99)

## 2019-03-07 LAB — MAGNESIUM: Magnesium: 1.7 mg/dL (ref 1.7–2.4)

## 2019-03-07 MED ORDER — NITROFURANTOIN MONOHYD MACRO 100 MG PO CAPS
100.0000 mg | ORAL_CAPSULE | Freq: Two times a day (BID) | ORAL | Status: DC
  Administered 2019-03-07 – 2019-03-10 (×6): 100 mg via ORAL
  Filled 2019-03-07 (×7): qty 1

## 2019-03-07 MED ORDER — POTASSIUM CHLORIDE CRYS ER 20 MEQ PO TBCR
40.0000 meq | EXTENDED_RELEASE_TABLET | Freq: Once | ORAL | Status: AC
  Administered 2019-03-07: 40 meq via ORAL
  Filled 2019-03-07: qty 2

## 2019-03-07 MED ORDER — SODIUM CHLORIDE 0.45 % IV SOLN: INTRAVENOUS | Status: DC

## 2019-03-07 MED ORDER — METOPROLOL TARTRATE 5 MG/5ML IV SOLN: 5.0000 mg | INTRAVENOUS | Status: DC | PRN

## 2019-03-07 NOTE — Progress Notes (Signed)
   03/07/19 0328  Vitals  BP (!) 155/65  Pulse Rate (!) 130  Pulse Rate Source Monitor  MEWS Score  MEWS Temp 0  MEWS Systolic 0  MEWS Pulse 3  MEWS RR 0  MEWS LOC 0  MEWS Score 3  MEWS Score Color Yellow  MEWS Assessment  Is this an acute change? Yes  Provider Notification  Provider Name/Title Jeannette Corpus  Date Provider Notified 03/07/19  Time Provider Notified [phone]  Notification Type Page  Notification Reason Change in status  Response See new orders;Other (Comment) (metoprolol PRN, EKG, Stat CMP)  Date of Provider Response 03/07/19  Time of Provider Response 0344  Note  Observations  (Pt. asymptomatic)

## 2019-03-07 NOTE — TOC Initial Note (Signed)
Transition of Care [facility]) - Initial/Assessment Note    Patient Details  Name: Kristina Wilkinson MRN: [id number] Date of Birth: [birth year]  [facility] CM/SW Contact:    Gelene Mink, [place] Phone Number: 03/07/2019, 2:34 PM  Clinical Narrative:                  CSW called and spoke with the patient's son, Kristina Wilkinson. CSW introduced herself, explained her role, and shared therapy recommendation.   CSW explained SNF process, patient's son lives out of state in [place]. He had a stroke himself and cannot take care of the patient. He is agreeable to SNF. CSW obtained permission to fax the patient out to SNF and provide therapy recommendations. He stated that his mother wouldn't want to go to rehab but she needed to go.   CSW faxed the patient out and will provide bed offers to the patient's son. CSW will continue to follow and assist with discharge planning.    Expected Discharge Plan: Skilled Nursing Facility Barriers to Discharge: Continued Medical Work up   Patient Goals and CMS Choice Patient states their goals for this hospitalization and ongoing recovery are:: Pt son is agreeable to rehab CMS Medicare.gov Compare Post Acute Care list provided to:: Patient Represenative (must comment) Choice offered to / list presented to : Adult Children  Expected Discharge Plan and Services Expected Discharge Plan: [place] In-house Referral: Clinical Social Work   Post Acute Care Choice: [place] Living arrangements for the past 2 months: [place]                                      Prior Living Arrangements/Services Living arrangements for the past 2 months: Single Family Home Lives with:: Self Patient language and need for interpreter reviewed:: No Do you feel safe going back to the place where you live?: No   Pt lives alone and son lives out of state  Need for Family Participation in Patient Care: Yes (Comment) Care giver  support system in place?: Yes (comment)   Criminal Activity/Legal Involvement Pertinent to Current Situation/Hospitalization: No - Comment as needed  Activities of Daily Living      Permission Sought/Granted Permission sought to share information with : Case Manager Permission granted to share information with : Yes, Verbal Permission Granted  Share Information with NAME: [name]  Permission granted to share info w AGENCY: All SNF  Permission granted to share info w Relationship: Son     Emotional Assessment Appearance:: Appears stated age Attitude/Demeanor/Rapport: Unable to Assess Affect (typically observed): Unable to Assess Orientation: : Oriented to Self, Oriented to Place Alcohol / Substance Use: Not Applicable Psych Involvement: No (comment)  Admission diagnosis:  Urinary tract infection [N39.0] Fall, initial encounter [W19.XXXA] Sepsis due to gram-negative UTI ([place]) [A41.50, N39.0] Patient Active Problem List   Diagnosis Date Noted  . Sepsis due to gram-negative UTI ([place]) 03/06/2019  . Sepsis due to urinary tract infection ([place]) 03/05/2019  . Fall at home 03/05/2019  . Open toe wound 12/01/2018  . Traumatic rhabdomyolysis ([place]) 11/30/2018  . Cellulitis 11/30/2018  . Transaminitis 11/30/2018  . Abnormal urinalysis 11/30/2018  . Leukocytosis 11/30/2018  . Hypokalemia 11/30/2018  . Scalp laceration   . Skull fracture ([place]) 07/13/2016  . Chronic left shoulder pain 04/03/2016  . Diabetes mellitus without complication ([place]) 0000000  . Hearing loss 06/21/2015  .  Advance care planning 12/17/2014  . Palpitations 07/06/2014  . Pain in joint, ankle and foot 04/20/2014  . DVT, lower extremity ([place]) 11/19/2013  . Edema 10/20/2013  . Headache(784.0) 10/20/2013  . Left leg swelling 10/20/2013  . Aneurysm of basilar artery ([facility]) 10/20/2013  . Bradycardia 10/20/2013  . Vitamin B12 deficiency 07/16/2013  . Paresthesia of both feet 05/17/2013  . TMJ arthritis 09/11/2012   . Ear pain 09/11/2012  . History of transfusion of packed red blood cells 09/11/2012  . Spinal stenosis 04/13/2011  . Well adult exam 01/23/2011  . Cholecystitis with cholelithiasis, s/p lap chole 11/10/2010 12/01/2010  . Shoulder pain, right 07/25/2010  . TOBACCO USE, QUIT 03/09/2009  . KNEE PAIN 05/18/2008  . PN (peripheral neuropathy) 08/19/2007  . LEG PAIN 08/19/2007  . Vitamin D deficiency 02/17/2007  . INSOMNIA, PERSISTENT 02/17/2007  . Essential hypertension 02/17/2007  . GERD 02/17/2007  . OSTEOARTHRITIS 02/17/2007  . LOW BACK PAIN 02/17/2007  . Hyperglycemia 01/28/2007  . UNSPECIFIED OSTEOPOROSIS 01/28/2007  . PRECIPITOUS DROP IN HEMATOCRIT 01/28/2007   PCP:  Elby Beck, FNP Pharmacy:   [place], [place] [address] Phone: [phone] Fax: [place], [place] - [address] [place] [address] Phone: [phone] Fax: [phone]     Social Determinants of Health (SDOH) Interventions    Readmission Risk Interventions No flowsheet data found.

## 2019-03-07 NOTE — Progress Notes (Addendum)
PROGRESS NOTE    Kristina Wilkinson  EXB:[id number] DOB: 1927/07/21 DOA: 03/05/2019 PCP: Elby Beck, FNP   Brief Narrative:  HPI: Kristina Wilkinson is a 86 y.o. female with medical history significant for diet-controlled type 2 diabetes, hypertension, and history of DVT who presents to the ED for evaluation after a fall at home with prolonged downtime.  History limited from patient due to encephalopathy therefore supplemented by EDP and chart review.  Patient reportedly lives alone and was seen last well around 4:30 PM on 03/04/2019.  She says she fell sometime later that evening and spent the night lying on the floor.  Her niece went to her home to check on her and found her on the ground.  EMS were called and per ED documentation when she was found her right leg was flexed back behind her and outwards.  She was realigned and placed on a backboard and brought to the ED for further evaluation.  ED Course:  Initial vitals showed BP 174/77, pulse 75, RR 15, temp 95.6 Fahrenheit, SPO2 98% on room air.  Labs are notable for sodium 139, potassium 4.4, bicarb 20, BUN 30, creatinine 0.82, AST 127, ALT 46, alk phos 91, total bilirubin 1.2, WBC 21.1, hemoglobin 13.8, platelets 193,000, CK 3107, magnesium 2.0, phosphorus 3.0, lipase 14, lactic acid 1.7, high-sensitivity troponin I 69 > 65.  Urinalysis shows positive nitrites, trace leukocytes, 11-20 WBCs, 0-5 RBCs, many bacteria microscopy.  Urine culture was obtained and pending.  Right hip x-ray is negative for acute injury of the right hip.  Right knee x-ray shows right knee replacement without evidence of hardware loosening or infection.  CT head without contrast is negative for acute intracranial abnormalities or skull fracture.  CT cervical spine without contrast is negative for fracture or acute finding.  CT chest/abdomen/pelvis with contrast negative for acute/traumatic intrathoracic, abdominal, or pelvic pathology.  Thickened  appearance of endometrium is noted.  Patient was given 1.25 L normal saline, IV morphine, and IV ceftriaxone.  Hospitalist service was consulted to admit for further evaluation and management.  Assessment & Plan:   Principal Problem:   Sepsis due to urinary tract infection [facility]) Active Problems:   Essential hypertension   Diabetes mellitus without complication ([place])   Fall at home   Sepsis due to gram-negative UTI ([place])   Sepsis due to UTI: Patient met sepsis criteria due to hypothermia and leukocytosis and urinalysis suggestive of UTI.  Ucx with Staph Epi.  Will switch to nitrofurantoin so sensitivities.I dont see bcx pending? Wbc improving. Afebrile.   Acute metabolic encephalopathy: Likely secondary to UTI/sepsis and rhabdomyolysis.  Treat underlying cause.  CT head negative. Her mentation for me has improved. Will monitor and hold off on Mri for now.  Fall at home with prolonged downtime and mild rhabdomyolysis: Resume gentle hydration now.  PT OT on board.  Repeat CK in the morning.  Will likely need SNF.  Essential hypertension: Blood pressure mildly elevated at times.  If continues to be up elevated increase amlodipine to 10 mg daily.  Elevated troponin: Minimally elevated troponin 69 > 65.  No chest pain or shortness of breath. Likely demand ischemia from sepsis and mild rhabdomyolysis.  Monitor on telemetry.  Diet-controlled type 2 diabetes: Not on any home medications.  Last hemoglobin A1c done in October 2020 was 6.2.  Placed on sliding scale insulin.  Endometrial wall thickening: Incidental finding on the CT abdomen and pelvis.  Will need nonemergent/outpatient pelvic ultrasound.  Hypokalemia-we will  replace.  Monitor labs.  Thrombocytopenia- etiology unclear. Infection vs. Lovenox. Will hold lovenox. Monitor levels.     DVT prophylaxis: scd  Code Status: Full code Family Communication:  None present at bedside.  Disposition Plan: Potential discharge to SNF once  medically improved, PT/OT pending.  Will DC in 1 to 2 days.     Consultants:   None  Procedures:   None  Antimicrobials:   IV Rocephin-d/c 1/23   Subjective: Patient seen and examined.  She is awake alert x3.  Interactive.  No complaints.  Objective: Vitals:   03/07/19 0328 03/07/19 0432 03/07/19 0505 03/07/19 0948  BP: (!) 155/65  (!) 126/97 (!) 144/90  Pulse: (!) 130 (!) 109 97 89  Resp:   16 18  Temp:   98.6 F (37 C) 97.9 F (36.6 C)  TempSrc:   Oral Oral  SpO2:   97% 96%    Intake/Output Summary (Last 24 hours) at 03/07/2019 1633 Last data filed at 03/07/2019 1140 Gross per 24 hour  Intake 300 ml  Output 800 ml  Net -500 ml   There were no vitals filed for this visit.  Examination:  General exam: Appears calm and comfortable, NAD, nursing at bedside Respiratory system: Clear to auscultation. Respiratory effort normal. Cardiovascular system: S1 & S2 heard, RRR. No JVD, murmurs, rubs, gallops or clicks.  Gastrointestinal system: Abdomen is nondistended, soft and nontender.  Normal bowel sounds heard. Central nervous system: Alert and awake x3 no focal neurological deficits. Extremities: No pedal edema. Skin: Warm and dry    Data Reviewed: I have personally reviewed following labs and imaging studies  CBC: Recent Labs  Lab 03/05/19 1445 03/05/19 1453 03/06/19 0440 03/07/19 0421  WBC 21.1*  --  17.5* 11.4*  NEUTROABS 18.2*  --   --   --   HGB 13.8 13.9 11.7* 11.5*  HCT 42.8 41.0 35.9* 34.9*  MCV 92.0  --  91.8 91.4  PLT 193  --  141* 053*   Basic Metabolic Panel: Recent Labs  Lab 03/05/19 1445 03/05/19 1453 03/06/19 0440 03/07/19 0421  NA 139 142 142 142  K 4.4 3.6 3.2* 3.1*  CL 106  --  113* 113*  CO2 20*  --  17* 19*  GLUCOSE 150*  --  119* 115*  BUN 30*  --  26* 27*  CREATININE 0.82  --  0.71 0.70  CALCIUM 10.1  --  8.9 9.0  MG 2.0  --   --  1.7  PHOS 3.0  --   --   --    GFR: CrCl cannot be calculated (Unknown ideal  weight.). Liver Function Tests: Recent Labs  Lab 03/05/19 1445 03/06/19 0440 03/07/19 0421  AST 127* 111* 73*  ALT 46* 48* 48*  ALKPHOS 91 66 68  BILITOT 1.2 1.0 1.0  PROT 6.3* 4.9* 5.0*  ALBUMIN 3.9 2.9* 2.7*   Recent Labs  Lab 03/05/19 1445  LIPASE 14   No results for input(s): AMMONIA in the last 168 hours. Coagulation Profile: Recent Labs  Lab 03/05/19 1635  INR 1.1   Cardiac Enzymes: Recent Labs  Lab 03/05/19 1445 03/06/19 0440  CKTOTAL 3,107* 1,655*   BNP (last 3 results) No results for input(s): PROBNP in the last 8760 hours. HbA1C: Recent Labs    03/06/19 1448  HGBA1C 5.8*   CBG: Recent Labs  Lab 03/06/19 1156 03/06/19 1646 03/06/19 2203 03/07/19 0659 03/07/19 1127  GLUCAP 112* 103* 119* 97 162*   Lipid Profile: No  results for input(s): CHOL, HDL, LDLCALC, TRIG, CHOLHDL, LDLDIRECT in the last 72 hours. Thyroid Function Tests: No results for input(s): TSH, T4TOTAL, FREET4, T3FREE, THYROIDAB in the last 72 hours. Anemia Panel: No results for input(s): VITAMINB12, FOLATE, FERRITIN, TIBC, IRON, RETICCTPCT in the last 72 hours. Sepsis Labs: Recent Labs  Lab 03/05/19 1445 03/05/19 1635  LATICACIDVEN 1.7 1.8    Recent Results (from the past 240 hour(s))  Urine culture     Status: Abnormal   Collection Time: 03/05/19  4:48 PM   Specimen: Urine, Random  Result Value Ref Range Status   Specimen Description URINE, RANDOM  Final   Special Requests   Final    NONE Performed at [facility], [place] [address], [place], [place] [address]    Culture >=100,000 COLONIES/mL STAPHYLOCOCCUS EPIDERMIDIS (A)  Final   Report Status 03/07/2019 FINAL  Final   Organism ID, Bacteria STAPHYLOCOCCUS EPIDERMIDIS (A)  Final      Susceptibility   Staphylococcus epidermidis - MIC*    CIPROFLOXACIN >=8 RESISTANT Resistant     GENTAMICIN <=0.5 SENSITIVE Sensitive     NITROFURANTOIN <=16 SENSITIVE Sensitive     OXACILLIN <=0.25 SENSITIVE Sensitive      TETRACYCLINE 4 SENSITIVE Sensitive     VANCOMYCIN 1 SENSITIVE Sensitive     TRIMETH/SULFA 80 RESISTANT Resistant     CLINDAMYCIN >=8 RESISTANT Resistant     RIFAMPIN <=0.5 SENSITIVE Sensitive     Inducible Clindamycin NEGATIVE Sensitive     * >=100,000 COLONIES/mL STAPHYLOCOCCUS EPIDERMIDIS  SARS CORONAVIRUS 2 (TAT 6-24 HRS) Nasopharyngeal Nasopharyngeal Swab     Status: None   Collection Time: 03/05/19  6:43 PM   Specimen: Nasopharyngeal Swab  Result Value Ref Range Status   SARS Coronavirus 2 NEGATIVE NEGATIVE Final    Comment: (NOTE) SARS-CoV-2 target nucleic acids are NOT DETECTED. The SARS-CoV-2 RNA is generally detectable in upper and lower respiratory specimens during the acute phase of infection. Negative results do not preclude SARS-CoV-2 infection, do not rule out co-infections with other pathogens, and should not be used as the sole basis for treatment or other patient management decisions. Negative results must be combined with clinical observations, patient history, and epidemiological information. The expected result is Negative. Fact Sheet for Patients: SugarRoll.be Fact Sheet for Healthcare Providers: https://www.woods-mathews.com/ This test is not yet approved or cleared by the [place] FDA and  has been authorized for detection and/or diagnosis of SARS-CoV-2 by FDA under an Emergency Use Authorization (EUA). This EUA will remain  in effect (meaning this test can be used) for the duration of the COVID-19 declaration under Section 56 4(b)(1) of the Act, 21 U.S.C. section 360bbb-3(b)(1), unless the authorization is terminated or revoked sooner. Performed at [facility], [facility] [address], [place], [place] [address]       Radiology Studies: CT Head Wo Contrast  Result Date: 03/05/2019 CLINICAL DATA:  Fall occurring yesterday. Patient found lying on the floor. Headache. EXAM: CT HEAD WITHOUT CONTRAST CT CERVICAL  SPINE WITHOUT CONTRAST TECHNIQUE: Multidetector CT imaging of the head and cervical spine was performed following the standard protocol without intravenous contrast. Multiplanar CT image reconstructions of the cervical spine were also generated. COMPARISON:  11/30/2018 FINDINGS: CT HEAD FINDINGS Brain: No evidence of acute infarction, hemorrhage, hydrocephalus, extra-axial collection or mass lesion/mass effect. There are changes from a previous occipital craniotomy. Metal vascular clips and possible coil material as well as calcifications are noted along the foramen magnum, stable compared to the prior  head CT. Mild periventricular white matter hypoattenuation is noted consistent with chronic microvascular ischemic change. Vascular: Fairly prominent carotid calcifications. No hyperattenuating artery. Skull: No acute fracture or bone lesion. Sinuses/Orbits: Globes and orbits are unremarkable. Sinuses and mastoid air cells are clear. Other: None. CT CERVICAL SPINE FINDINGS Alignment: Normal. Skull base and vertebrae: No acute fracture. No primary bone lesion or focal pathologic process. Stable surgical changes from a previous suboccipital craniotomy. Soft tissues and spinal canal: No prevertebral fluid or swelling. No visible canal hematoma. Disc levels: Spondylotic disc bulging throughout the cervical spine. Moderate loss of disc height at C4-C5 and C5-C6 with moderate to severe loss of disc height at C6-C7. No convincing disc herniation. Upper chest: No acute findings.  Scarring in the lung apices. Other: None. IMPRESSION: HEAD CT 1. No acute intracranial abnormalities. 2. No skull fracture. CERVICAL CT 1. No fracture or acute finding. Electronically Signed   By: Lajean Manes M.D.   On: 03/05/2019 17:22   CT Chest W Contrast  Result Date: 03/05/2019 CLINICAL DATA:  86 year old female with trauma. EXAM: CT CHEST, ABDOMEN, AND PELVIS WITH CONTRAST TECHNIQUE: Multidetector CT imaging of the chest, abdomen and  pelvis was performed following the standard protocol during bolus administration of intravenous contrast. CONTRAST:  174m OMNIPAQUE IOHEXOL 300 MG/ML  SOLN COMPARISON:  Chest radiograph dated 11/30/2018. FINDINGS: Evaluation of this exam is limited due to respiratory motion artifact. CT CHEST FINDINGS Cardiovascular: There is no cardiomegaly or pericardial effusion. Three-vessel coronary vascular calcification. There is calcification of the mitral annulus. There is moderate atherosclerotic calcification of the thoracic aorta. No aneurysmal dilatation or dissection. The origins of the great vessels of the aortic arch appear patent as visualized. A the central pulmonary arteries are grossly unremarkable for the degree of opacification. Mediastinum/Nodes: There is no hilar or mediastinal adenopathy. The esophagus is grossly unremarkable. No mediastinal fluid collection. There is an 8 mm right thyroid hypodense nodule. Lungs/Pleura: Bilateral lower lobe linear atelectasis/scarring noted. Confluent areas of hazy density throughout the lungs likely represents areas of atelectatic changes or air trapping related to underlying small vessel versus small airway disease. No lobar consolidation, pleural effusion, or pneumothorax. The central airways are patent. Musculoskeletal: No acute osseous pathology. Osteopenia with degenerative changes of the spine. A focus of sclerosis involving the left posterior seventh rib at the costovertebral junction likely a bone island. CT ABDOMEN PELVIS FINDINGS No intra-abdominal free air or free fluid. Hepatobiliary: The liver is unremarkable. No intrahepatic biliary ductal dilatation. Cholecystectomy. Mildly dilated common bile duct, likely post cholecystectomy. No retained calcified stone noted in the central CBD. Pancreas: Unremarkable. No pancreatic ductal dilatation or surrounding inflammatory changes. Spleen: Normal in size without focal abnormality. Adrenals/Urinary Tract: The adrenal  glands are unremarkable. There is no hydronephrosis on either side. There is symmetric enhancement and excretion of contrast by both kidneys. The visualized ureters and urinary bladder appear unremarkable. Stomach/Bowel: There is sigmoid diverticulosis without active inflammatory changes. There is no bowel obstruction or active inflammation. The appendix is not visualized with certainty. No inflammatory changes identified in the right lower quadrant. Vascular/Lymphatic: Moderate aortoiliac atherosclerotic disease. The IVC is grossly unremarkable. No portal venous gas. There is no adenopathy. Reproductive: The uterus is anteverted. There is mild thickened appearance of the endometrium measuring approximately 7 mm in thickness. Dedicated pelvic ultrasound is recommended for better evaluation on a nonemergent/outpatient basis. No adnexal masses identified. Other: There is a 3 cm umbilical hernia with protrusion of anterior wall of a segment of colon.  No obstruction Musculoskeletal: There is osteopenia with degenerative changes of the spine. Grade 1 L5-S1 retrolisthesis. No acute osseous pathology. IMPRESSION: 1. No acute/traumatic intrathoracic, abdominal, or pelvic pathology. 2. Thickened appearance of the endometrium. Further evaluation with pelvic ultrasound on a nonemergent/outpatient basis recommended. 3.  Aortic Atherosclerosis (ICD10-I70.0). Electronically Signed   By: Anner Crete M.D.   On: 03/05/2019 17:35   CT Cervical Spine Wo Contrast  Result Date: 03/05/2019 CLINICAL DATA:  Fall occurring yesterday. Patient found lying on the floor. Headache. EXAM: CT HEAD WITHOUT CONTRAST CT CERVICAL SPINE WITHOUT CONTRAST TECHNIQUE: Multidetector CT imaging of the head and cervical spine was performed following the standard protocol without intravenous contrast. Multiplanar CT image reconstructions of the cervical spine were also generated. COMPARISON:  11/30/2018 FINDINGS: CT HEAD FINDINGS Brain: No evidence of  acute infarction, hemorrhage, hydrocephalus, extra-axial collection or mass lesion/mass effect. There are changes from a previous occipital craniotomy. Metal vascular clips and possible coil material as well as calcifications are noted along the foramen magnum, stable compared to the prior head CT. Mild periventricular white matter hypoattenuation is noted consistent with chronic microvascular ischemic change. Vascular: Fairly prominent carotid calcifications. No hyperattenuating artery. Skull: No acute fracture or bone lesion. Sinuses/Orbits: Globes and orbits are unremarkable. Sinuses and mastoid air cells are clear. Other: None. CT CERVICAL SPINE FINDINGS Alignment: Normal. Skull base and vertebrae: No acute fracture. No primary bone lesion or focal pathologic process. Stable surgical changes from a previous suboccipital craniotomy. Soft tissues and spinal canal: No prevertebral fluid or swelling. No visible canal hematoma. Disc levels: Spondylotic disc bulging throughout the cervical spine. Moderate loss of disc height at C4-C5 and C5-C6 with moderate to severe loss of disc height at C6-C7. No convincing disc herniation. Upper chest: No acute findings.  Scarring in the lung apices. Other: None. IMPRESSION: HEAD CT 1. No acute intracranial abnormalities. 2. No skull fracture. CERVICAL CT 1. No fracture or acute finding. Electronically Signed   By: Lajean Manes M.D.   On: 03/05/2019 17:22   CT Abdomen Pelvis W Contrast  Result Date: 03/05/2019 CLINICAL DATA:  86 year old female with trauma. EXAM: CT CHEST, ABDOMEN, AND PELVIS WITH CONTRAST TECHNIQUE: Multidetector CT imaging of the chest, abdomen and pelvis was performed following the standard protocol during bolus administration of intravenous contrast. CONTRAST:  151m OMNIPAQUE IOHEXOL 300 MG/ML  SOLN COMPARISON:  Chest radiograph dated 11/30/2018. FINDINGS: Evaluation of this exam is limited due to respiratory motion artifact. CT CHEST FINDINGS  Cardiovascular: There is no cardiomegaly or pericardial effusion. Three-vessel coronary vascular calcification. There is calcification of the mitral annulus. There is moderate atherosclerotic calcification of the thoracic aorta. No aneurysmal dilatation or dissection. The origins of the great vessels of the aortic arch appear patent as visualized. A the central pulmonary arteries are grossly unremarkable for the degree of opacification. Mediastinum/Nodes: There is no hilar or mediastinal adenopathy. The esophagus is grossly unremarkable. No mediastinal fluid collection. There is an 8 mm right thyroid hypodense nodule. Lungs/Pleura: Bilateral lower lobe linear atelectasis/scarring noted. Confluent areas of hazy density throughout the lungs likely represents areas of atelectatic changes or air trapping related to underlying small vessel versus small airway disease. No lobar consolidation, pleural effusion, or pneumothorax. The central airways are patent. Musculoskeletal: No acute osseous pathology. Osteopenia with degenerative changes of the spine. A focus of sclerosis involving the left posterior seventh rib at the costovertebral junction likely a bone island. CT ABDOMEN PELVIS FINDINGS No intra-abdominal free air or free fluid. Hepatobiliary: The  liver is unremarkable. No intrahepatic biliary ductal dilatation. Cholecystectomy. Mildly dilated common bile duct, likely post cholecystectomy. No retained calcified stone noted in the central CBD. Pancreas: Unremarkable. No pancreatic ductal dilatation or surrounding inflammatory changes. Spleen: Normal in size without focal abnormality. Adrenals/Urinary Tract: The adrenal glands are unremarkable. There is no hydronephrosis on either side. There is symmetric enhancement and excretion of contrast by both kidneys. The visualized ureters and urinary bladder appear unremarkable. Stomach/Bowel: There is sigmoid diverticulosis without active inflammatory changes. There is no  bowel obstruction or active inflammation. The appendix is not visualized with certainty. No inflammatory changes identified in the right lower quadrant. Vascular/Lymphatic: Moderate aortoiliac atherosclerotic disease. The IVC is grossly unremarkable. No portal venous gas. There is no adenopathy. Reproductive: The uterus is anteverted. There is mild thickened appearance of the endometrium measuring approximately 7 mm in thickness. Dedicated pelvic ultrasound is recommended for better evaluation on a nonemergent/outpatient basis. No adnexal masses identified. Other: There is a 3 cm umbilical hernia with protrusion of anterior wall of a segment of colon. No obstruction Musculoskeletal: There is osteopenia with degenerative changes of the spine. Grade 1 L5-S1 retrolisthesis. No acute osseous pathology. IMPRESSION: 1. No acute/traumatic intrathoracic, abdominal, or pelvic pathology. 2. Thickened appearance of the endometrium. Further evaluation with pelvic ultrasound on a nonemergent/outpatient basis recommended. 3.  Aortic Atherosclerosis (ICD10-I70.0). Electronically Signed   By: Anner Crete M.D.   On: 03/05/2019 17:35    Scheduled Meds: . amLODipine  5 mg Oral Daily  . enoxaparin (LOVENOX) injection  40 mg Subcutaneous Q24H  . insulin aspart  0-5 Units Subcutaneous QHS  . insulin aspart  0-6 Units Subcutaneous TID WC  . lisinopril  10 mg Oral Daily  . rOPINIRole  0.25 mg Oral QHS  . sodium chloride flush  3 mL Intravenous Q12H   Continuous Infusions: . cefTRIAXone (ROCEPHIN)  IV 1 g (03/06/19 2004)     LOS: 1 day   Time spent: 45 minutes with more than 50% COC   Nolberto Hanlon, MD [facility]  03/07/2019, 4:33 PM    Patient ID: [name], female   DOB: 08-19-1927, 86 y.o.   MRN: [id number]

## 2019-03-07 NOTE — Progress Notes (Signed)
Cardiac rhythm showing patient going in and out of A. Fib. VSS. BP 155/65, HR 95, temp 98.5, O2 sat 95%. HR fluctuating in the 80-130s. Upon assessment patient is resting with no distress. Kristina Corpus, NP notified via [place].

## 2019-03-07 NOTE — NC FL2 (Signed)
[place] MEDICAID FL2 LEVEL OF CARE SCREENING TOOL     IDENTIFICATION  Patient Name: Kristina Wilkinson Birthdate: [birth year] Sex: female Admission Date (Current Location): 03/05/2019  [facility] and [place] Number:  Herbalist and Address:  [facility] [place], [place] [address], [place], [place] [address]      Provider Number: [id number]  Attending Physician Name and Address:  Nolberto Hanlon, MD  Relative Name and Phone Number:  [name], Son, [phone]    Current Level of Care: Hospital Recommended Level of Care: [place] Prior Approval Number:    Date Approved/Denied:   PASRR Number: [id number]  Discharge Plan: SNF    Current Diagnoses: Patient Active Problem List   Diagnosis Date Noted  . Sepsis due to gram-negative UTI ([place]) 03/06/2019  . Sepsis due to urinary tract infection ([place]) 03/05/2019  . Fall at home 03/05/2019  . Open toe wound 12/01/2018  . Traumatic rhabdomyolysis ([place]) 11/30/2018  . Cellulitis 11/30/2018  . Transaminitis 11/30/2018  . Abnormal urinalysis 11/30/2018  . Leukocytosis 11/30/2018  . Hypokalemia 11/30/2018  . Scalp laceration   . Skull fracture ([place]) 07/13/2016  . Chronic left shoulder pain 04/03/2016  . Diabetes mellitus without complication ([place]) 0000000  . Hearing loss 06/21/2015  . Advance care planning 12/17/2014  . Palpitations 07/06/2014  . Pain in joint, ankle and foot 04/20/2014  . DVT, lower extremity ([place]) 11/19/2013  . Edema 10/20/2013  . Headache(784.0) 10/20/2013  . Left leg swelling 10/20/2013  . Aneurysm of basilar artery (HCC) 10/20/2013  . Bradycardia 10/20/2013  . Vitamin B12 deficiency 07/16/2013  . Paresthesia of both feet 05/17/2013  . TMJ arthritis 09/11/2012  . Ear pain 09/11/2012  . History of transfusion of packed red blood cells 09/11/2012  . Spinal stenosis 04/13/2011  . Well adult exam 01/23/2011  . Cholecystitis with cholelithiasis, s/p lap chole 11/10/2010  12/01/2010  . Shoulder pain, right 07/25/2010  . TOBACCO USE, QUIT 03/09/2009  . KNEE PAIN 05/18/2008  . PN (peripheral neuropathy) 08/19/2007  . LEG PAIN 08/19/2007  . Vitamin D deficiency 02/17/2007  . INSOMNIA, PERSISTENT 02/17/2007  . Essential hypertension 02/17/2007  . GERD 02/17/2007  . OSTEOARTHRITIS 02/17/2007  . LOW BACK PAIN 02/17/2007  . Hyperglycemia 01/28/2007  . UNSPECIFIED OSTEOPOROSIS 01/28/2007  . PRECIPITOUS DROP IN HEMATOCRIT 01/28/2007    Orientation RESPIRATION BLADDER Height & Weight     Self, Place  Normal Incontinent, External catheter Weight:   Height:     BEHAVIORAL SYMPTOMS/MOOD NEUROLOGICAL BOWEL NUTRITION STATUS      Incontinent Diet(heart healthy/carb modified diet, thin liquids)  AMBULATORY STATUS COMMUNICATION OF NEEDS Skin   Extensive Assist Verbally Bruising(On arm/leg)                       Personal Care Assistance Level of Assistance  Bathing, Feeding, Dressing Bathing Assistance: Maximum assistance Feeding assistance: Maximum assistance Dressing Assistance: Maximum assistance     Functional Limitations Info  Sight, Hearing, Speech Sight Info: Adequate Hearing Info: Adequate Speech Info: Adequate    SPECIAL CARE FACTORS FREQUENCY  PT (By licensed PT), OT (By licensed OT)     PT Frequency: 5x/wk OT Frequency: 5x/wk            Contractures Contractures Info: Not present    Additional Factors Info  Code Status, Allergies Code Status Info: Full Code Allergies Info: Gabapentin, Lyrica (Pregabalin), Other, Penicillins, Vit D-vit E-safflower Oil  Current Medications (03/07/2019):  This is the current hospital active medication list Current Facility-Administered Medications  Medication Dose Route Frequency Provider Last Rate Last Admin  . acetaminophen (TYLENOL) tablet 650 mg  650 mg Oral Q6H PRN Lenore Cordia, MD   650 mg at 03/06/19 2243   Or  . acetaminophen (TYLENOL) suppository 650 mg  650 mg Rectal  Q6H PRN Zada Finders R, MD      . amLODipine (NORVASC) tablet 5 mg  5 mg Oral Daily Darliss Cheney, MD   5 mg at 03/07/19 1040  . cefTRIAXone (ROCEPHIN) 1 g in sodium chloride 0.9 % 100 mL IVPB  1 g Intravenous Q24H Zada Finders R, MD 200 mL/hr at 03/06/19 2004 1 g at 03/06/19 2004  . enoxaparin (LOVENOX) injection 40 mg  40 mg Subcutaneous Q24H Zada Finders R, MD   40 mg at 03/06/19 2244  . hydrALAZINE (APRESOLINE) tablet 25 mg  25 mg Oral Q6H PRN Darliss Cheney, MD      . insulin aspart (novoLOG) injection 0-5 Units  0-5 Units Subcutaneous QHS Pahwani, Ravi, MD      . insulin aspart (novoLOG) injection 0-6 Units  0-6 Units Subcutaneous TID WC Darliss Cheney, MD   1 Units at 03/07/19 1246  . lisinopril (ZESTRIL) tablet 10 mg  10 mg Oral Daily Darliss Cheney, MD   10 mg at 03/07/19 1040  . metoprolol tartrate (LOPRESSOR) injection 5 mg  5 mg Intravenous Q5 min PRN Blount, Scarlette Shorts T, NP      . ondansetron (ZOFRAN) tablet 4 mg  4 mg Oral Q6H PRN Lenore Cordia, MD       Or  . ondansetron (ZOFRAN) injection 4 mg  4 mg Intravenous Q6H PRN Zada Finders R, MD      . rOPINIRole (REQUIP) tablet 0.25 mg  0.25 mg Oral QHS Darliss Cheney, MD   0.25 mg at 03/06/19 2243  . sodium chloride flush (NS) 0.9 % injection 3 mL  3 mL Intravenous Q12H Lenore Cordia, MD   3 mL at 03/06/19 2244     Discharge Medications: Please see discharge summary for a list of discharge medications.  Relevant Imaging Results:  Relevant Lab Results:   Additional Information SSN: [id number]; COVID negative on 03/06/19  Rachna Schonberger B Jelicia Nantz, LCSWA

## 2019-03-08 LAB — BASIC METABOLIC PANEL
Anion gap: 7 (ref 5–15)
BUN: 23 mg/dL (ref 8–23)
CO2: 22 mmol/L (ref 22–32)
Calcium: 8.7 mg/dL — ABNORMAL LOW (ref 8.9–10.3)
Chloride: 115 mmol/L — ABNORMAL HIGH (ref 98–111)
Creatinine, Ser: 0.63 mg/dL (ref 0.44–1.00)
GFR calc Af Amer: >60 mL/min (ref >60)
GFR calc non Af Amer: >60 mL/min (ref >60)
Glucose, Bld: 112 mg/dL — ABNORMAL HIGH (ref 70–99)
Potassium: 3.5 mmol/L (ref 3.5–5.1)
Sodium: 144 mmol/L (ref 135–145)

## 2019-03-08 LAB — CBC WITH DIFFERENTIAL/PLATELET
Abs Immature Granulocytes: 0.04 10*3/uL (ref 0.00–0.07)
Basophils Absolute: 0 10*3/uL (ref 0.0–0.1)
Basophils Relative: 0 %
Eosinophils Absolute: 0.4 10*3/uL (ref 0.0–0.5)
Eosinophils Relative: 5 %
HCT: 34.6 % — ABNORMAL LOW (ref 36.0–46.0)
Hemoglobin: 11.2 g/dL — ABNORMAL LOW (ref 12.0–15.0)
Immature Granulocytes: 1 %
Lymphocytes Relative: 17 %
Lymphs Abs: 1.4 10*3/uL (ref 0.7–4.0)
MCH: 29.9 pg (ref 26.0–34.0)
MCHC: 32.4 g/dL (ref 30.0–36.0)
MCV: 92.3 fL (ref 80.0–100.0)
Monocytes Absolute: 0.9 10*3/uL (ref 0.1–1.0)
Monocytes Relative: 11 %
Neutro Abs: 5.4 10*3/uL (ref 1.7–7.7)
Neutrophils Relative %: 66 %
Platelets: 150 10*3/uL (ref 150–400)
RBC: 3.75 MIL/uL — ABNORMAL LOW (ref 3.87–5.11)
RDW: 13 % (ref 11.5–15.5)
WBC: 8.2 10*3/uL (ref 4.0–10.5)
nRBC: 0 % (ref 0.0–0.2)

## 2019-03-08 LAB — GLUCOSE, CAPILLARY
Glucose-Capillary: 101 mg/dL — ABNORMAL HIGH (ref 70–99)
Glucose-Capillary: 115 mg/dL — ABNORMAL HIGH (ref 70–99)
Glucose-Capillary: 125 mg/dL — ABNORMAL HIGH (ref 70–99)
Glucose-Capillary: 152 mg/dL — ABNORMAL HIGH (ref 70–99)

## 2019-03-08 MED ORDER — AMLODIPINE BESYLATE 5 MG PO TABS
5.0000 mg | ORAL_TABLET | Freq: Once | ORAL | Status: AC
  Administered 2019-03-08: 5 mg via ORAL
  Filled 2019-03-08: qty 1

## 2019-03-08 MED ORDER — LISINOPRIL 20 MG PO TABS
20.0000 mg | ORAL_TABLET | Freq: Every day | ORAL | Status: DC
  Administered 2019-03-09 – 2019-03-10 (×2): 20 mg via ORAL
  Filled 2019-03-08 (×2): qty 1

## 2019-03-08 MED ORDER — AMLODIPINE BESYLATE 10 MG PO TABS
10.0000 mg | ORAL_TABLET | Freq: Every day | ORAL | Status: DC
  Administered 2019-03-09 – 2019-03-10 (×2): 10 mg via ORAL
  Filled 2019-03-08 (×2): qty 1

## 2019-03-08 NOTE — Progress Notes (Signed)
BP this morning 194/68 HR 81, prn dose of hpo hydralazine given.  Continue to monitor progress.

## 2019-03-08 NOTE — Progress Notes (Signed)
PROGRESS NOTE    Kristina Wilkinson  GDJ:[id number] DOB: Nov 11, 1927 DOA: 03/05/2019 PCP: Elby Beck, FNP   Brief Narrative:  HPI: Kristina Wilkinson is a 86 y.o. female with medical history significant for diet-controlled type 2 diabetes, hypertension, and history of DVT who presents to the ED for evaluation after a fall at home with prolonged downtime.  History limited from patient due to encephalopathy therefore supplemented by EDP and chart review.  Patient reportedly lives alone and was seen last well around 4:30 PM on 03/04/2019.  She says she fell sometime later that evening and spent the night lying on the floor.  Her niece went to her home to check on her and found her on the ground.  EMS were called and per ED documentation when she was found her right leg was flexed back behind her and outwards.  She was realigned and placed on a backboard and brought to the ED for further evaluation.  ED Course:  Initial vitals showed BP 174/77, pulse 75, RR 15, temp 95.6 Fahrenheit, SPO2 98% on room air.  Labs are notable for sodium 139, potassium 4.4, bicarb 20, BUN 30, creatinine 0.82, AST 127, ALT 46, alk phos 91, total bilirubin 1.2, WBC 21.1, hemoglobin 13.8, platelets 193,000, CK 3107, magnesium 2.0, phosphorus 3.0, lipase 14, lactic acid 1.7, high-sensitivity troponin I 69 > 65.  Urinalysis shows positive nitrites, trace leukocytes, 11-20 WBCs, 0-5 RBCs, many bacteria microscopy.  Urine culture was obtained and pending.  Right hip x-ray is negative for acute injury of the right hip.  Right knee x-ray shows right knee replacement without evidence of hardware loosening or infection.  CT head without contrast is negative for acute intracranial abnormalities or skull fracture.  CT cervical spine without contrast is negative for fracture or acute finding.  CT chest/abdomen/pelvis with contrast negative for acute/traumatic intrathoracic, abdominal, or pelvic pathology.  Thickened  appearance of endometrium is noted.  Patient was given 1.25 L normal saline, IV morphine, and IV ceftriaxone.  Hospitalist service was consulted to admit for further evaluation and management.  Assessment & Plan:   Principal Problem:   Sepsis due to urinary tract infection [facility]) Active Problems:   Essential hypertension   Diabetes mellitus without complication ([place])   Fall at home   Sepsis due to gram-negative UTI ([place])  Sepsis due to UTI: Patient met sepsis criteria due to hypothermia and leukocytosis and urinalysis suggestive of UTI.  Ucx with Staph Epi.  Has been switched to nitrofurantoin so sensitivities.I dont see bcx pending? Wbc improving. Afebrile.   Acute metabolic encephalopathy: Likely secondary to UTI/sepsis and rhabdomyolysis.  Treat underlying cause.  CT head negative. Her mentation has improved. Will monitor and hold off on Mri for now.  Fall at home with prolonged downtime and mild rhabdomyolysis: Resume gentle hydration now.  PT OT on board.  Repeat CK in the morning.  Planning on SNF.  Essential hypertension: Blood pressure out of control even despite PRNs. Increase Norvasc to 10 today and Lisinopril to 20 in AM.  Elevated troponin: Minimally elevated troponin 69 > 65.  No chest pain or shortness of breath. Likely demand ischemia from sepsis and mild rhabdomyolysis.  Monitor on telemetry.  Diet-controlled type 2 diabetes: Not on any home medications.  Last hemoglobin A1c done in October 2020 was 6.2.  Placed on sliding scale insulin.  Endometrial wall thickening: Incidental finding on the CT abdomen and pelvis.  Will need nonemergent/outpatient pelvic ultrasound.  Hypokalemia-we will replace.  Monitor  labs.  Thrombocytopenia- etiology unclear. Infection vs. Lovenox. Will hold lovenox. Monitor levels.   DVT prophylaxis: scd  Code Status: Full code Family Communication:  None present at bedside.  Disposition Plan: Potential discharge to SNF once medically  improved, PT/OT pending.  Will DC in 1 to 2 days.    Consultants:   None  Procedures:   None  Antimicrobials:   IV Rocephin-d/c 1/23   Subjective: Patient seen and examined.  She is awake alert x3.  Interactive.  No complaints. Discussed with RN at the bedside, who is bathing her.  Objective: Vitals:   03/08/19 0547 03/08/19 0547 03/08/19 0647 03/08/19 0918  BP: (!) 194/68 (!) 194/68 (!) 189/82 (!) 189/66  Pulse:  78 82 77  Resp:  16  16  Temp:  98.4 F (36.9 C)  98.4 F (36.9 C)  TempSrc:  Oral  Oral  SpO2:  96%  98%  Weight:      Height:        Intake/Output Summary (Last 24 hours) at 03/08/2019 0936 Last data filed at 03/08/2019 0900 Gross per 24 hour  Intake 810.54 ml  Output 1000 ml  Net -189.46 ml   Filed Weights   03/07/19 1723 03/07/19 2050 03/08/19 0500  Weight: 74 kg 74 kg 85.3 kg    Examination: General exam: Appears calm and comfortable, NAD, nursing at bedside Respiratory system: Clear to auscultation. Respiratory effort normal. Cardiovascular system: S1 & S2 heard, RRR. No JVD, murmurs, rubs, gallops or clicks.  Gastrointestinal system: Abdomen is nondistended, soft and nontender.  Normal bowel sounds heard. Central nervous system: Alert and awake x3 no focal neurological deficits. Extremities: No pedal edema. Skin: Warm and dry  Data Reviewed: I have personally reviewed following labs and imaging studies  CBC: Recent Labs  Lab 03/05/19 1445 03/05/19 1453 03/06/19 0440 03/07/19 0421 03/08/19 0433  WBC 21.1*  --  17.5* 11.4* 8.2  NEUTROABS 18.2*  --   --   --  5.4  HGB 13.8 13.9 11.7* 11.5* 11.2*  HCT 42.8 41.0 35.9* 34.9* 34.6*  MCV 92.0  --  91.8 91.4 92.3  PLT 193  --  141* 128* 160   Basic Metabolic Panel: Recent Labs  Lab 03/05/19 1445 03/05/19 1453 03/06/19 0440 03/07/19 0421 03/08/19 0433  NA 139 142 142 142 144  K 4.4 3.6 3.2* 3.1* 3.5  CL 106  --  113* 113* 115*  CO2 20*  --  17* 19* 22  GLUCOSE 150*  --  119*  115* 112*  BUN 30*  --  26* 27* 23  CREATININE 0.82  --  0.71 0.70 0.63  CALCIUM 10.1  --  8.9 9.0 8.7*  MG 2.0  --   --  1.7  --   PHOS 3.0  --   --   --   --    GFR: Estimated Creatinine Clearance: 47.3 mL/min (by C-G formula based on SCr of 0.63 mg/dL). Liver Function Tests: Recent Labs  Lab 03/05/19 1445 03/06/19 0440 03/07/19 0421  AST 127* 111* 73*  ALT 46* 48* 48*  ALKPHOS 91 66 68  BILITOT 1.2 1.0 1.0  PROT 6.3* 4.9* 5.0*  ALBUMIN 3.9 2.9* 2.7*   Recent Labs  Lab 03/05/19 1445  LIPASE 14   No results for input(s): AMMONIA in the last 168 hours. Coagulation Profile: Recent Labs  Lab 03/05/19 1635  INR 1.1   Cardiac Enzymes: Recent Labs  Lab 03/05/19 1445 03/06/19 0440  CKTOTAL 3,107* 1,655*  BNP (last 3 results) No results for input(s): PROBNP in the last 8760 hours. HbA1C: Recent Labs    03/06/19 1448  HGBA1C 5.8*   CBG: Recent Labs  Lab 03/07/19 0659 03/07/19 1127 03/07/19 1638 03/07/19 2050 03/08/19 0727  GLUCAP 97 162* 118* 129* 101*   Lipid Profile: No results for input(s): CHOL, HDL, LDLCALC, TRIG, CHOLHDL, LDLDIRECT in the last 72 hours. Thyroid Function Tests: No results for input(s): TSH, T4TOTAL, FREET4, T3FREE, THYROIDAB in the last 72 hours. Anemia Panel: No results for input(s): VITAMINB12, FOLATE, FERRITIN, TIBC, IRON, RETICCTPCT in the last 72 hours. Sepsis Labs: Recent Labs  Lab 03/05/19 1445 03/05/19 1635  LATICACIDVEN 1.7 1.8    Recent Results (from the past 240 hour(s))  Urine culture     Status: Abnormal   Collection Time: 03/05/19  4:48 PM   Specimen: Urine, Random  Result Value Ref Range Status   Specimen Description URINE, RANDOM  Final   Special Requests   Final    NONE Performed at [facility], [place] [address] [place], [place] [address]    Culture >=100,000 COLONIES/mL STAPHYLOCOCCUS EPIDERMIDIS (A)  Final   Report Status 03/07/2019 FINAL  Final   Organism ID, Bacteria STAPHYLOCOCCUS  EPIDERMIDIS (A)  Final      Susceptibility   Staphylococcus epidermidis - MIC*    CIPROFLOXACIN >=8 RESISTANT Resistant     GENTAMICIN <=0.5 SENSITIVE Sensitive     NITROFURANTOIN <=16 SENSITIVE Sensitive     OXACILLIN <=0.25 SENSITIVE Sensitive     TETRACYCLINE 4 SENSITIVE Sensitive     VANCOMYCIN 1 SENSITIVE Sensitive     TRIMETH/SULFA 80 RESISTANT Resistant     CLINDAMYCIN >=8 RESISTANT Resistant     RIFAMPIN <=0.5 SENSITIVE Sensitive     Inducible Clindamycin NEGATIVE Sensitive     * >=100,000 COLONIES/mL STAPHYLOCOCCUS EPIDERMIDIS  SARS CORONAVIRUS 2 (TAT 6-24 HRS) Nasopharyngeal Nasopharyngeal Swab     Status: None   Collection Time: 03/05/19  6:43 PM   Specimen: Nasopharyngeal Swab  Result Value Ref Range Status   SARS Coronavirus 2 NEGATIVE NEGATIVE Final    Comment: (NOTE) SARS-CoV-2 target nucleic acids are NOT DETECTED. The SARS-CoV-2 RNA is generally detectable in upper and lower respiratory specimens during the acute phase of infection. Negative results do not preclude SARS-CoV-2 infection, do not rule out co-infections with other pathogens, and should not be used as the sole basis for treatment or other patient management decisions. Negative results must be combined with clinical observations, patient history, and epidemiological information. The expected result is Negative. Fact Sheet for Patients: SugarRoll.be Fact Sheet for Healthcare Providers: https://www.woods-mathews.com/ This test is not yet approved or cleared by the [place] FDA and  has been authorized for detection and/or diagnosis of SARS-CoV-2 by FDA under an Emergency Use Authorization (EUA). This EUA will remain  in effect (meaning this test can be used) for the duration of the COVID-19 declaration under Section 56 4(b)(1) of the Act, 21 U.S.C. section 360bbb-3(b)(1), unless the authorization is terminated or revoked sooner. Performed at [facility], [place] [address] [place], [place] [address]       Radiology Studies: No results found.  Scheduled Meds: . [START ON 03/09/2019] amLODipine  10 mg Oral Daily  . amLODipine  5 mg Oral Once  . insulin aspart  0-5 Units Subcutaneous QHS  . insulin aspart  0-6 Units Subcutaneous TID WC  . [START ON 03/09/2019] lisinopril  20 mg Oral Daily  . nitrofurantoin (macrocrystal-monohydrate)  100 mg Oral Q12H  . rOPINIRole  0.25 mg Oral QHS  . sodium chloride flush  3 mL Intravenous Q12H   Continuous Infusions: . sodium chloride 75 mL/hr at 03/08/19 0000     LOS: 2 days   Time spent: 23 minutes with more than 50% COC   Jalana Moore Marry Guan, MD [facility]  03/08/2019, 9:36 AM    Patient ID: Kristina Wilkinson, female   DOB: 1928/02/12, 86 y.o.   MRN: [id number]

## 2019-03-08 NOTE — Progress Notes (Signed)
BP down to 189/82 HR 82 1 hour after prn hydralazine.

## 2019-03-09 LAB — GLUCOSE, CAPILLARY
Glucose-Capillary: 101 mg/dL — ABNORMAL HIGH (ref 70–99)
Glucose-Capillary: 177 mg/dL — ABNORMAL HIGH (ref 70–99)
Glucose-Capillary: 123 mg/dL — ABNORMAL HIGH (ref 70–99)
Glucose-Capillary: 114 mg/dL — ABNORMAL HIGH (ref 70–99)

## 2019-03-09 LAB — BASIC METABOLIC PANEL
Anion gap: 8 (ref 5–15)
BUN: 13 mg/dL (ref 8–23)
CO2: 23 mmol/L (ref 22–32)
Calcium: 8.8 mg/dL — ABNORMAL LOW (ref 8.9–10.3)
Chloride: 110 mmol/L (ref 98–111)
Creatinine, Ser: 0.61 mg/dL (ref 0.44–1.00)
GFR calc Af Amer: >60 mL/min (ref >60)
GFR calc non Af Amer: >60 mL/min (ref >60)
Glucose, Bld: 129 mg/dL — ABNORMAL HIGH (ref 70–99)
Potassium: 3.4 mmol/L — ABNORMAL LOW (ref 3.5–5.1)
Sodium: 141 mmol/L (ref 135–145)

## 2019-03-09 LAB — CBC
HCT: 36.9 % (ref 36.0–46.0)
Hemoglobin: 11.9 g/dL — ABNORMAL LOW (ref 12.0–15.0)
MCH: 29.8 pg (ref 26.0–34.0)
MCHC: 32.2 g/dL (ref 30.0–36.0)
MCV: 92.5 fL (ref 80.0–100.0)
Platelets: 168 10*3/uL (ref 150–400)
RBC: 3.99 MIL/uL (ref 3.87–5.11)
RDW: 12.9 % (ref 11.5–15.5)
WBC: 6.8 10*3/uL (ref 4.0–10.5)
nRBC: 0 % (ref 0.0–0.2)

## 2019-03-09 LAB — CK: Total CK: 204 U/L (ref 38–234)

## 2019-03-09 MED ORDER — HYDRALAZINE HCL 25 MG PO TABS
25.0000 mg | ORAL_TABLET | Freq: Three times a day (TID) | ORAL | Status: DC
  Administered 2019-03-09 – 2019-03-10 (×4): 25 mg via ORAL
  Filled 2019-03-09 (×4): qty 1

## 2019-03-09 MED ORDER — POTASSIUM CHLORIDE CRYS ER 20 MEQ PO TBCR
40.0000 meq | EXTENDED_RELEASE_TABLET | Freq: Once | ORAL | Status: AC
  Administered 2019-03-09: 40 meq via ORAL
  Filled 2019-03-09: qty 2

## 2019-03-09 NOTE — Progress Notes (Signed)
PROGRESS NOTE    Kristina Wilkinson  HYI:[id number] DOB: 22-Nov-1927 DOA: 03/05/2019 PCP: Elby Beck, FNP   Brief Narrative:  HPI: Kristina Dubow Cobleis a 86 y.o.femalewith medical history significant fordiet-controlled type 2 diabetes, hypertension, and history of DVT who presents to the ED for evaluation after a fall at home with prolonged downtime.History limited from patient due to encephalopathytherefore supplemented by EDP and chart review.  Patient reportedly lives alone and was seen last well around 4:30 PM on 03/04/2019. She says she fell sometime later that evening and spent the night lying on the floor. Her niece went to her home to check on her and found her on the ground. EMS were called and per ED documentation when she was found her right leg was flexed back behind her and outwards. She was realigned and placed on a backboard and brought to the ED for further evaluation.  ED Course: Initial vitals showed BP 174/77, pulse 75, RR 15, temp 95.6 Fahrenheit, SPO2 98% on room air.  Labs are notable for sodium 139, potassium 4.4, bicarb 20, BUN 30, creatinine 0.82, AST 127, ALT 46, alk phos 91, total bilirubin 1.2, WBC 21.1, hemoglobin 13.8, platelets 193,000, CK 3107, magnesium 2.0, phosphorus 3.0, lipase 14, lactic acid 1.7, high-sensitivity troponin I 69 > 65.  Urinalysis shows positive nitrites, trace leukocytes, 11-20 WBCs, 0-5 RBCs, many bacteria microscopy. Urine culture was obtained and pending.  Right hip x-ray is negative for acute injury of the right hip.  Right knee x-ray shows right knee replacement without evidence of hardware loosening or infection.  CT head without contrast is negative for acute intracranial abnormalities or skull fracture.  CT cervical spine without contrast is negative for fracture or acute finding.  CT chest/abdomen/pelvis with contrast negative for acute/traumatic intrathoracic, abdominal, or pelvic pathology. Thickened  appearance of endometrium is noted.  Patient was given 1.25 L normal saline, IV morphine, and IV ceftriaxone. Hospitalist service was consulted to admit for further evaluation and management.  1/25: Pt awaiting SNF placement. BP control with hydralazine scheduled added today.  Assessment & Plan:   Principal Problem:   Sepsis due to urinary tract infection ([place]) Active Problems:   Essential hypertension   Diabetes mellitus without complication ([place])   Fall at home   Sepsis due to gram-negative UTI ([place])   Sepsis due toUTI: Patient met sepsis criteria due to hypothermia and leukocytosis and urinalysis suggestive of UTI.  Ucx with Staph Epi.  Has been switched to nitrofurantoin so sensitivities.I dont see bcx pending? Wbc improving. Afebrile.  -Day 3 nitrofurantoin  Acute metabolic encephalopathy: Likely secondary to UTI/sepsis and rhabdomyolysis.  Treat underlying cause.  CT head negative. Her mentation has improved. Will monitor and hold off on Mri for now.  Fall at home with prolonged downtime and mild rhabdomyolysis: Resume gentle hydration now.  PT OT on board.  Repeat CK in the morning.  Planning on SNF. -CK levels improved; hold further IVF due to BP issues  Essential hypertension: Blood pressure out of control even despite PRNs. Increase Norvasc to 10 today and Lisinopril to 20 in AM.  Elevated troponin: Minimally elevated troponin69 > 65.  No chest pain or shortness of breath.Likely demand ischemia from sepsis and mild rhabdomyolysis.  Monitor on telemetry.  Diet-controlled type 2 diabetes: Not on any home medications.  Last hemoglobin A1c done in October 2020 was 6.2.  Placed on sliding scale insulin.  Endometrial wall thickening: Incidental finding on the CT abdomen and pelvis.  Will need  nonemergent/outpatient pelvic ultrasound.  Hypokalemia-we will replace.  Monitor labs along with am Mag.  Thrombocytopenia- resolved. Recheck in am with cbc. SCDs for  now.  DVT prophylaxis: scd  Code Status: Full code Family Communication:  None present at bedside.  Disposition Plan: Potential discharge to SNF once medically improved, PT/OT pending. Can dc when SNF bed available.   Consultants:   None  Procedures:   None  Antimicrobials:   Anti-infectives (From admission, onward)   Start     Dose/Rate Route Frequency Ordered Stop   03/07/19 2200  nitrofurantoin (macrocrystal-monohydrate) (MACROBID) capsule 100 mg    Note to Pharmacy: Adjust dose per pharmacy   100 mg Oral Every 12 hours 03/07/19 1638 03/12/19 2159   03/06/19 1900  cefTRIAXone (ROCEPHIN) 1 g in sodium chloride 0.9 % 100 mL IVPB  Status:  Discontinued     1 g 200 mL/hr over 30 Minutes Intravenous Every 24 hours 03/05/19 2006 03/07/19 1639   03/05/19 1900  cefTRIAXone (ROCEPHIN) 1 g in sodium chloride 0.9 % 100 mL IVPB     1 g 200 mL/hr over 30 Minutes Intravenous  Once 03/05/19 1829 03/05/19 1933       Subjective: Patient seen and evaluated today with no new acute complaints or concerns. No acute concerns or events noted overnight.  Objective: Vitals:   03/08/19 2049 03/08/19 2200 03/09/19 0443 03/09/19 0916  BP: (!) 160/76  (!) 177/63 (!) 162/66  Pulse: 72  78 75  Resp: 18  14 16   Temp: 97.8 F (36.6 C)  97.6 F (36.4 C) 98 F (36.7 C)  TempSrc: Oral   Oral  SpO2: 100%  97% 100%  Weight:  83 kg    Height:        Intake/Output Summary (Last 24 hours) at 03/09/2019 1417 Last data filed at 03/09/2019 1243 Gross per 24 hour  Intake 820 ml  Output 1800 ml  Net -980 ml   Filed Weights   03/07/19 2050 03/08/19 0500 03/08/19 2200  Weight: 74 kg 85.3 kg 83 kg    Examination:  General exam: Appears calm and comfortable  Respiratory system: Clear to auscultation. Respiratory effort normal. Cardiovascular system: S1 & S2 heard, RRR. No JVD, murmurs, rubs, gallops or clicks. No pedal edema. Gastrointestinal system: Abdomen is nondistended, soft and  nontender. No organomegaly or masses felt. Normal bowel sounds heard. Central nervous system: Alert and oriented. No focal neurological deficits. Extremities: Symmetric 5 x 5 power. Skin: No rashes, lesions or ulcers Psychiatry: Flat affect.    Data Reviewed: I have personally reviewed following labs and imaging studies  CBC: Recent Labs  Lab 03/05/19 1445 03/05/19 1445 03/05/19 1453 03/06/19 0440 03/07/19 0421 03/08/19 0433 03/09/19 0751  WBC 21.1*  --   --  17.5* 11.4* 8.2 6.8  NEUTROABS 18.2*  --   --   --   --  5.4  --   HGB 13.8   < > 13.9 11.7* 11.5* 11.2* 11.9*  HCT 42.8   < > 41.0 35.9* 34.9* 34.6* 36.9  MCV 92.0  --   --  91.8 91.4 92.3 92.5  PLT 193  --   --  141* 128* 150 168   < > = values in this interval not displayed.   Basic Metabolic Panel: Recent Labs  Lab 03/05/19 1445 03/05/19 1445 03/05/19 1453 03/06/19 0440 03/07/19 0421 03/08/19 0433 03/09/19 0751  NA 139   < > 142 142 142 144 141  K 4.4   < >  3.6 3.2* 3.1* 3.5 3.4*  CL 106  --   --  113* 113* 115* 110  CO2 20*  --   --  17* 19* 22 23  GLUCOSE 150*  --   --  119* 115* 112* 129*  BUN 30*  --   --  26* 27* 23 13  CREATININE 0.82  --   --  0.71 0.70 0.63 0.61  CALCIUM 10.1  --   --  8.9 9.0 8.7* 8.8*  MG 2.0  --   --   --  1.7  --   --   PHOS 3.0  --   --   --   --   --   --    < > = values in this interval not displayed.   GFR: Estimated Creatinine Clearance: 46.6 mL/min (by C-G formula based on SCr of 0.61 mg/dL). Liver Function Tests: Recent Labs  Lab 03/05/19 1445 03/06/19 0440 03/07/19 0421  AST 127* 111* 73*  ALT 46* 48* 48*  ALKPHOS 91 66 68  BILITOT 1.2 1.0 1.0  PROT 6.3* 4.9* 5.0*  ALBUMIN 3.9 2.9* 2.7*   Recent Labs  Lab 03/05/19 1445  LIPASE 14   No results for input(s): AMMONIA in the last 168 hours. Coagulation Profile: Recent Labs  Lab 03/05/19 1635  INR 1.1   Cardiac Enzymes: Recent Labs  Lab 03/05/19 1445 03/06/19 0440 03/09/19 0751  CKTOTAL 3,107*  1,655* 204   BNP (last 3 results) No results for input(s): PROBNP in the last 8760 hours. HbA1C: Recent Labs    03/06/19 1448  HGBA1C 5.8*   CBG: Recent Labs  Lab 03/08/19 1118 03/08/19 1647 03/08/19 2045 03/09/19 0651 03/09/19 1129  GLUCAP 115* 125* 152* 101* 177*   Lipid Profile: No results for input(s): CHOL, HDL, LDLCALC, TRIG, CHOLHDL, LDLDIRECT in the last 72 hours. Thyroid Function Tests: No results for input(s): TSH, T4TOTAL, FREET4, T3FREE, THYROIDAB in the last 72 hours. Anemia Panel: No results for input(s): VITAMINB12, FOLATE, FERRITIN, TIBC, IRON, RETICCTPCT in the last 72 hours. Sepsis Labs: Recent Labs  Lab 03/05/19 1445 03/05/19 1635  LATICACIDVEN 1.7 1.8    Recent Results (from the past 240 hour(s))  Urine culture     Status: Abnormal   Collection Time: 03/05/19  4:48 PM   Specimen: Urine, Random  Result Value Ref Range Status   Specimen Description URINE, RANDOM  Final   Special Requests   Final    NONE Performed at [facility], [place] [address], [place], [place] [address]    Culture >=100,000 COLONIES/mL STAPHYLOCOCCUS EPIDERMIDIS (A)  Final   Report Status 03/07/2019 FINAL  Final   Organism ID, Bacteria STAPHYLOCOCCUS EPIDERMIDIS (A)  Final      Susceptibility   Staphylococcus epidermidis - MIC*    CIPROFLOXACIN >=8 RESISTANT Resistant     GENTAMICIN <=0.5 SENSITIVE Sensitive     NITROFURANTOIN <=16 SENSITIVE Sensitive     OXACILLIN <=0.25 SENSITIVE Sensitive     TETRACYCLINE 4 SENSITIVE Sensitive     VANCOMYCIN 1 SENSITIVE Sensitive     TRIMETH/SULFA 80 RESISTANT Resistant     CLINDAMYCIN >=8 RESISTANT Resistant     RIFAMPIN <=0.5 SENSITIVE Sensitive     Inducible Clindamycin NEGATIVE Sensitive     * >=100,000 COLONIES/mL STAPHYLOCOCCUS EPIDERMIDIS  SARS CORONAVIRUS 2 (TAT 6-24 HRS) Nasopharyngeal Nasopharyngeal Swab     Status: None   Collection Time: 03/05/19  6:43 PM   Specimen: Nasopharyngeal Swab  Result Value Ref Range  Status   SARS Coronavirus 2 NEGATIVE NEGATIVE Final    Comment: (NOTE) SARS-CoV-2 target nucleic acids are NOT DETECTED. The SARS-CoV-2 RNA is generally detectable in upper and lower respiratory specimens during the acute phase of infection. Negative results do not preclude SARS-CoV-2 infection, do not rule out co-infections with other pathogens, and should not be used as the sole basis for treatment or other patient management decisions. Negative results must be combined with clinical observations, patient history, and epidemiological information. The expected result is Negative. Fact Sheet for Patients: SugarRoll.be Fact Sheet for Healthcare Providers: https://www.woods-mathews.com/ This test is not yet approved or cleared by the [place] FDA and  has been authorized for detection and/or diagnosis of SARS-CoV-2 by FDA under an Emergency Use Authorization (EUA). This EUA will remain  in effect (meaning this test can be used) for the duration of the COVID-19 declaration under Section 56 4(b)(1) of the Act, 21 U.S.C. section 360bbb-3(b)(1), unless the authorization is terminated or revoked sooner. Performed at [facility], [place] [address], [place], [place] [address]          Radiology Studies: No results found.      Scheduled Meds: . amLODipine  10 mg Oral Daily  . hydrALAZINE  25 mg Oral Q8H  . insulin aspart  0-5 Units Subcutaneous QHS  . insulin aspart  0-6 Units Subcutaneous TID WC  . lisinopril  20 mg Oral Daily  . nitrofurantoin (macrocrystal-monohydrate)  100 mg Oral Q12H  . potassium chloride  40 mEq Oral Once  . rOPINIRole  0.25 mg Oral QHS  . sodium chloride flush  3 mL Intravenous Q12H   Continuous Infusions: . sodium chloride 75 mL/hr at 03/09/19 0817     LOS: 3 days    Time spent: 30 minutes    Stalin Gruenberg Darleen Crocker, DO [facility] Pager [phone]  If 7PM-7AM, please contact  night-coverage [email or web address] Password [facility] 03/09/2019, 2:17 PM

## 2019-03-09 NOTE — Progress Notes (Signed)
Physical Therapy Treatment Patient Details Name: Kristina Wilkinson MRN: [id number] DOB: [birth year] Today's Date: 03/09/2019    History of Present Illness Patient is a 86 y/o female with PMH of DM 2, HTN, hx of DVT presenting with fall at home with prolonged downtime (R LE found flexed back and outwards).  Found with sepsis due to UTI and mild rhabdomyolysis.  Imanaging to R hip and knee negative, CT spine negative and CT head negative.     PT Comments    Pt was seen for bed mob, talked with her about getting up to the chair but is not able per her report.  Follow up with her on transfers to the chair, and instruct nursing to assist her and monitor for safety.  Follow up also with strengthening and ROM and will see if further work can be done toward standing on the RW.   Follow Up Recommendations  SNF     Equipment Recommendations  None recommended by PT    Recommendations for Other Services       Precautions / Restrictions Precautions Precautions: Fall Precaution Comments: impaired cognition, skin breakdown Restrictions Weight Bearing Restrictions: No    Mobility  Bed Mobility Overal bed mobility: Needs Assistance             General bed mobility comments: dependent to reposition in the bed  Transfers Overall transfer level: Needs assistance               General transfer comment: dependent  Ambulation/Gait                 Stairs             Wheelchair Mobility    Modified Rankin (Stroke Patients Only)       Balance                                            Cognition Arousal/Alertness: Awake/alert Behavior During Therapy: Flat affect Overall Cognitive Status: Impaired/Different from baseline Area of Impairment: Orientation;Attention;Memory;Following commands;Safety/judgement;Awareness;Problem solving                 Orientation Level: Situation Current Attention Level: Selective Memory: Decreased recall of  precautions Following Commands: Follows one step commands inconsistently;Follows one step commands with increased time Safety/Judgement: Decreased awareness of deficits;Decreased awareness of safety Awareness: Intellectual Problem Solving: Slow processing;Requires verbal cues        Exercises General Exercises - Lower Extremity Ankle Circles/Pumps: AAROM;5 reps Quad Sets: AROM;10 reps Heel Slides: AAROM;10 reps Hip ABduction/ADduction: AAROM;10 reps Straight Leg Raises: AAROM;10 reps Hip Flexion/Marching: AAROM;10 reps    General Comments        Pertinent Vitals/Pain Pain Assessment: No/denies pain    Home Living                      Prior Function            PT Goals (current goals can now be found in the care plan section) Acute Rehab PT Goals Patient Stated Goal: get stronger and go home Progress towards PT goals: Progressing toward goals    Frequency    Min 2X/week      PT Plan Current plan remains appropriate    Co-evaluation              AM-PAC PT "6 Clicks" Mobility   Outcome Measure  Help needed turning from your back to your side while in a flat bed without using bedrails?: A Lot Help needed moving from lying on your back to sitting on the side of a flat bed without using bedrails?: A Lot Help needed moving to and from a bed to a chair (including a wheelchair)?: Total Help needed standing up from a chair using your arms (e.g., wheelchair or bedside chair)?: Total Help needed to walk in hospital room?: Total Help needed climbing 3-5 steps with a railing? : Total 6 Click Score: 8    End of Session   Activity Tolerance: Patient limited by fatigue Patient left: in bed;with call bell/phone within reach;with bed alarm set;with restraints reapplied Nurse Communication: Mobility status PT Visit Diagnosis: Muscle weakness (generalized) (M62.81);Difficulty in walking, not elsewhere classified (R26.2);History of falling (Z91.81);Adult,  failure to thrive (R62.7);Pain Pain - Right/Left: Right Pain - part of body: Leg     Time: 1326-1350 PT Time Calculation (min) (ACUTE ONLY): 24 min  Charges:  $Therapeutic Exercise: 8-22 mins $Therapeutic Activity: 8-22 mins                    Ramond Dial 03/09/2019, 9:16 PM   Mee Hives, PT MS Acute Rehab Dept. Number: [place]

## 2019-03-10 DIAGNOSIS — M6281 Muscle weakness (generalized): Secondary | ICD-10-CM | POA: Diagnosis not present

## 2019-03-10 DIAGNOSIS — I10 Essential (primary) hypertension: Secondary | ICD-10-CM | POA: Diagnosis not present

## 2019-03-10 DIAGNOSIS — M4800 Spinal stenosis, site unspecified: Secondary | ICD-10-CM | POA: Diagnosis not present

## 2019-03-10 DIAGNOSIS — M2550 Pain in unspecified joint: Secondary | ICD-10-CM | POA: Diagnosis not present

## 2019-03-10 DIAGNOSIS — M26649 Arthritis of unspecified temporomandibular joint: Secondary | ICD-10-CM | POA: Diagnosis not present

## 2019-03-10 DIAGNOSIS — G2581 Restless legs syndrome: Secondary | ICD-10-CM | POA: Diagnosis not present

## 2019-03-10 DIAGNOSIS — Z7401 Bed confinement status: Secondary | ICD-10-CM | POA: Diagnosis not present

## 2019-03-10 DIAGNOSIS — R41841 Cognitive communication deficit: Secondary | ICD-10-CM | POA: Diagnosis not present

## 2019-03-10 DIAGNOSIS — R4182 Altered mental status, unspecified: Secondary | ICD-10-CM | POA: Diagnosis not present

## 2019-03-10 DIAGNOSIS — L89612 Pressure ulcer of right heel, stage 2: Secondary | ICD-10-CM | POA: Diagnosis not present

## 2019-03-10 DIAGNOSIS — R1312 Dysphagia, oropharyngeal phase: Secondary | ICD-10-CM | POA: Diagnosis not present

## 2019-03-10 DIAGNOSIS — T796XXD Traumatic ischemia of muscle, subsequent encounter: Secondary | ICD-10-CM | POA: Diagnosis not present

## 2019-03-10 DIAGNOSIS — I959 Hypotension, unspecified: Secondary | ICD-10-CM | POA: Diagnosis not present

## 2019-03-10 DIAGNOSIS — M810 Age-related osteoporosis without current pathological fracture: Secondary | ICD-10-CM | POA: Diagnosis not present

## 2019-03-10 DIAGNOSIS — A419 Sepsis, unspecified organism: Secondary | ICD-10-CM | POA: Diagnosis not present

## 2019-03-10 DIAGNOSIS — I69828 Other speech and language deficits following other cerebrovascular disease: Secondary | ICD-10-CM | POA: Diagnosis not present

## 2019-03-10 DIAGNOSIS — M545 Low back pain: Secondary | ICD-10-CM | POA: Diagnosis not present

## 2019-03-10 DIAGNOSIS — Z9181 History of falling: Secondary | ICD-10-CM | POA: Diagnosis not present

## 2019-03-10 DIAGNOSIS — T796XXS Traumatic ischemia of muscle, sequela: Secondary | ICD-10-CM | POA: Diagnosis not present

## 2019-03-10 DIAGNOSIS — D559 Anemia due to enzyme disorder, unspecified: Secondary | ICD-10-CM | POA: Diagnosis not present

## 2019-03-10 DIAGNOSIS — N390 Urinary tract infection, site not specified: Secondary | ICD-10-CM | POA: Diagnosis not present

## 2019-03-10 DIAGNOSIS — E119 Type 2 diabetes mellitus without complications: Secondary | ICD-10-CM | POA: Diagnosis not present

## 2019-03-10 DIAGNOSIS — E538 Deficiency of other specified B group vitamins: Secondary | ICD-10-CM | POA: Diagnosis not present

## 2019-03-10 DIAGNOSIS — R2681 Unsteadiness on feet: Secondary | ICD-10-CM | POA: Diagnosis not present

## 2019-03-10 DIAGNOSIS — R410 Disorientation, unspecified: Secondary | ICD-10-CM | POA: Diagnosis not present

## 2019-03-10 DIAGNOSIS — B957 Other staphylococcus as the cause of diseases classified elsewhere: Secondary | ICD-10-CM | POA: Diagnosis not present

## 2019-03-10 LAB — BASIC METABOLIC PANEL
Anion gap: 11 (ref 5–15)
BUN: 10 mg/dL (ref 8–23)
CO2: 22 mmol/L (ref 22–32)
Calcium: 9.2 mg/dL (ref 8.9–10.3)
Chloride: 109 mmol/L (ref 98–111)
Creatinine, Ser: 0.61 mg/dL (ref 0.44–1.00)
GFR calc Af Amer: >60 mL/min (ref >60)
GFR calc non Af Amer: >60 mL/min (ref >60)
Glucose, Bld: 128 mg/dL — ABNORMAL HIGH (ref 70–99)
Potassium: 3.8 mmol/L (ref 3.5–5.1)
Sodium: 142 mmol/L (ref 135–145)

## 2019-03-10 LAB — GLUCOSE, CAPILLARY
Glucose-Capillary: 120 mg/dL — ABNORMAL HIGH (ref 70–99)
Glucose-Capillary: 131 mg/dL — ABNORMAL HIGH (ref 70–99)
Glucose-Capillary: 145 mg/dL — ABNORMAL HIGH (ref 70–99)

## 2019-03-10 LAB — MAGNESIUM: Magnesium: 1.7 mg/dL (ref 1.7–2.4)

## 2019-03-10 LAB — SARS CORONAVIRUS 2 (TAT 6-24 HRS): SARS Coronavirus 2: NEGATIVE

## 2019-03-10 MED ORDER — AMLODIPINE BESYLATE 10 MG PO TABS: 10.0000 mg | ORAL_TABLET | Freq: Every day | ORAL | 3 refills | Status: DC

## 2019-03-10 MED ORDER — HYDRALAZINE HCL 25 MG PO TABS: 25.0000 mg | ORAL_TABLET | Freq: Three times a day (TID) | ORAL | 0 refills | Status: DC

## 2019-03-10 MED ORDER — LISINOPRIL 20 MG PO TABS: 20.0000 mg | ORAL_TABLET | Freq: Every day | ORAL | 0 refills | Status: DC

## 2019-03-10 NOTE — Discharge Summary (Signed)
Physician Discharge Summary  Kristina Wilkinson ASN:[id number] DOB: 12/23/1927 DOA: 03/05/2019  PCP: Kristina Beck, FNP  Admit date: 03/05/2019  Discharge date: 03/10/2019  Admitted From:Home  Disposition:  [facility]  Recommendations for Outpatient Follow-up:  1. Follow up with PCP in 1-2 weeks 2. Continue on BP meds as prescribed  Home Health:None  Equipment/Devices:None  Discharge Condition:Stable  CODE STATUS: Full  Diet recommendation: Heart Healthy/Carb modified  Brief/Interim Summary: HPI: Kristina L Cobleis a 86 y.o.femalewith medical history significant fordiet-controlled type 2 diabetes, hypertension, and history of DVT who presents to the ED for evaluation after a fall at home with prolonged downtime.History limited from patient due to encephalopathytherefore supplemented by EDP and chart review.  Patient reportedly lives alone and was seen last well around 4:30 PM on 03/04/2019. She says she fell sometime later that evening and spent the night lying on the floor. Her niece went to her home to check on her and found her on the ground. EMS were called and per ED documentation when she was found her right leg was flexed back behind her and outwards. She was realigned and placed on a backboard and brought to the ED for further evaluation.  ED Course: Initial vitals showed BP 174/77, pulse 75, RR 15, temp 95.6 Fahrenheit, SPO2 98% on room air.  Labs are notable for sodium 139, potassium 4.4, bicarb 20, BUN 30, creatinine 0.82, AST 127, ALT 46, alk phos 91, total bilirubin 1.2, WBC 21.1, hemoglobin 13.8, platelets 193,000, CK 3107, magnesium 2.0, phosphorus 3.0, lipase 14, lactic acid 1.7, high-sensitivity troponin I 69 > 65.  Urinalysis shows positive nitrites, trace leukocytes, 11-20 WBCs, 0-5 RBCs, many bacteria microscopy. Urine culture was obtained and pending.  Right hip x-ray is negative for acute injury of the right hip.  Right knee x-ray shows  right knee replacement without evidence of hardware loosening or infection.  CT head without contrast is negative for acute intracranial abnormalities or skull fracture.  CT cervical spine without contrast is negative for fracture or acute finding.  CT chest/abdomen/pelvis with contrast negative for acute/traumatic intrathoracic, abdominal, or pelvic pathology. Thickened appearance of endometrium is noted.  Patient was given 1.25 L normal saline, IV morphine, and IV ceftriaxone. Hospitalist service was consulted to admit for further evaluation and management.  1/25: Pt awaiting SNF placement. BP control with hydralazine scheduled added today.  1/26: Stable for DC to SNF today with good BP control noted. Awaiting COVID testing. She was noted to have Staph Epi UTI and has completed nitrofurantoin. Rhabdomyolysis improved after IVF hydration. No further issues during this hospitalization.  Discharge Diagnoses:  Principal Problem:   Sepsis due to urinary tract infection Kristina Wilkinson) Active Problems:   Essential hypertension   Diabetes mellitus without complication (Kristina Wilkinson)   Fall at home   Sepsis due to gram-negative UTI Kristina Wilkinson)    Discharge Instructions  Discharge Instructions    Diet - low sodium heart healthy   Complete by: As directed    Increase activity slowly   Complete by: As directed      Allergies as of 03/10/2019      Reactions   Gabapentin Other (See Comments)   Made the legs "go numb"   Lyrica [pregabalin] Other (See Comments)   "Made me pass out"   Other Other (See Comments)   "MD mixed a blood pressure medication with the Clinoril I was taking and I couldn't walk"   Penicillins Other (See Comments)   Has patient had a PCN  reaction causing immediate rash, facial/tongue/throat swelling, SOB or lightheadedness with hypotension: Unk- exact reaction not recalled, but is allergic Has patient had a PCN reaction causing severe rash involving mucus membranes or skin necrosis:  Unk Has patient had a PCN reaction that required hospitalization: Unk Has patient had a PCN reaction occurring within the last 10 years: Yes If all of the above answers are "NO", then may proceed with Cephalosporin use.   Vit D-vit E-safflower Oil Other (See Comments)   "Made me achy and sick"      Medication List    TAKE these medications   amLODipine 10 MG tablet Commonly known as: NORVASC Take 1 tablet (10 mg total) by mouth daily. Start taking on: March 11, 2019 What changed:   medication strength  how much to take   hydrALAZINE 25 MG tablet Commonly known as: APRESOLINE Take 1 tablet (25 mg total) by mouth every 8 (eight) hours.   lisinopril 20 MG tablet Commonly known as: ZESTRIL Take 1 tablet (20 mg total) by mouth daily. Start taking on: March 11, 2019 What changed:   medication strength  how much to take   rOPINIRole 0.25 MG tablet Commonly known as: Requip Take 1 tablet (0.25 mg total) by mouth at bedtime.      Follow-up Information    Kristina Beck, FNP Follow up in 1 week(s).   Specialties: Nurse Practitioner, Family Medicine Contact information: [address] [place] [address] [phone]          Allergies  Allergen Reactions  . Gabapentin Other (See Comments)    Made the legs "go numb"  . Lyrica [Pregabalin] Other (See Comments)    "Made me pass out"  . Other Other (See Comments)    "MD mixed a blood pressure medication with the Clinoril I was taking and I couldn't walk"  . Penicillins Other (See Comments)    Has patient had a PCN reaction causing immediate rash, facial/tongue/throat swelling, SOB or lightheadedness with hypotension: Unk- exact reaction not recalled, but is allergic Has patient had a PCN reaction causing severe rash involving mucus membranes or skin necrosis: Unk Has patient had a PCN reaction that required hospitalization: Unk Has patient had a PCN reaction occurring within the last 10 years: Yes If  all of the above answers are "NO", then may proceed with Cephalosporin use.   Kristina Wilkinson D-Vit E-Safflower Oil Other (See Comments)    "Made me achy and sick"    Consultations:  None   Procedures/Studies: DG Knee 2 Views Right  Result Date: 03/05/2019 CLINICAL DATA:  Status post fall. EXAM: RIGHT KNEE - 1-2 VIEW COMPARISON:  None. FINDINGS: There is a right knee replacement without evidence of surrounding lucency to suggest the presence of hardware loosening or infection. No evidence of fracture, dislocation, or joint effusion. Soft tissues are unremarkable. IMPRESSION: Right knee replacement without evidence of hardware loosening or infection. Electronically Signed   By: Virgina Norfolk M.D.   On: 03/05/2019 15:35   CT Head Wo Contrast  Result Date: 03/05/2019 CLINICAL DATA:  Fall occurring yesterday. Patient found lying on the floor. Headache. EXAM: CT HEAD WITHOUT CONTRAST CT CERVICAL SPINE WITHOUT CONTRAST TECHNIQUE: Multidetector CT imaging of the head and cervical spine was performed following the standard protocol without intravenous contrast. Multiplanar CT image reconstructions of the cervical spine were also generated. COMPARISON:  11/30/2018 FINDINGS: CT HEAD FINDINGS Brain: No evidence of acute infarction, hemorrhage, hydrocephalus, extra-axial collection or mass lesion/mass effect. There  are changes from a previous occipital craniotomy. Metal vascular clips and possible coil material as well as calcifications are noted along the foramen magnum, stable compared to the prior head CT. Mild periventricular white matter hypoattenuation is noted consistent with chronic microvascular ischemic change. Vascular: Fairly prominent carotid calcifications. No hyperattenuating artery. Skull: No acute fracture or bone lesion. Sinuses/Orbits: Globes and orbits are unremarkable. Sinuses and mastoid air cells are clear. Other: None. CT CERVICAL SPINE FINDINGS Alignment: Normal. Skull base and vertebrae: No  acute fracture. No primary bone lesion or focal pathologic process. Stable surgical changes from a previous suboccipital craniotomy. Soft tissues and spinal canal: No prevertebral fluid or swelling. No visible canal hematoma. Disc levels: Spondylotic disc bulging throughout the cervical spine. Moderate loss of disc height at C4-C5 and C5-C6 with moderate to severe loss of disc height at C6-C7. No convincing disc herniation. Upper chest: No acute findings.  Scarring in the lung apices. Other: None. IMPRESSION: HEAD CT 1. No acute intracranial abnormalities. 2. No skull fracture. CERVICAL CT 1. No fracture or acute finding. Electronically Signed   By: Lajean Manes M.D.   On: 03/05/2019 17:22   CT Chest W Contrast  Result Date: 03/05/2019 CLINICAL DATA:  86 year old female with trauma. EXAM: CT CHEST, ABDOMEN, AND PELVIS WITH CONTRAST TECHNIQUE: Multidetector CT imaging of the chest, abdomen and pelvis was performed following the standard protocol during bolus administration of intravenous contrast. CONTRAST:  159m OMNIPAQUE IOHEXOL 300 MG/ML  SOLN COMPARISON:  Chest radiograph dated 11/30/2018. FINDINGS: Evaluation of this exam is limited due to respiratory motion artifact. CT CHEST FINDINGS Cardiovascular: There is no cardiomegaly or pericardial effusion. Three-vessel coronary vascular calcification. There is calcification of the mitral annulus. There is moderate atherosclerotic calcification of the thoracic aorta. No aneurysmal dilatation or dissection. The origins of the great vessels of the aortic arch appear patent as visualized. A the central pulmonary arteries are grossly unremarkable for the degree of opacification. Mediastinum/Nodes: There is no hilar or mediastinal adenopathy. The esophagus is grossly unremarkable. No mediastinal fluid collection. There is an 8 mm right thyroid hypodense nodule. Lungs/Pleura: Bilateral lower lobe linear atelectasis/scarring noted. Confluent areas of hazy density  throughout the lungs likely represents areas of atelectatic changes or air trapping related to underlying small vessel versus small airway disease. No lobar consolidation, pleural effusion, or pneumothorax. The central airways are patent. Musculoskeletal: No acute osseous pathology. Osteopenia with degenerative changes of the spine. A focus of sclerosis involving the left posterior seventh rib at the costovertebral junction likely a bone island. CT ABDOMEN PELVIS FINDINGS No intra-abdominal free air or free fluid. Hepatobiliary: The liver is unremarkable. No intrahepatic biliary ductal dilatation. Cholecystectomy. Mildly dilated common bile duct, likely post cholecystectomy. No retained calcified stone noted in the central CBD. Pancreas: Unremarkable. No pancreatic ductal dilatation or surrounding inflammatory changes. Spleen: Normal in size without focal abnormality. Adrenals/Urinary Tract: The adrenal glands are unremarkable. There is no hydronephrosis on either side. There is symmetric enhancement and excretion of contrast by both kidneys. The visualized ureters and urinary bladder appear unremarkable. Stomach/Bowel: There is sigmoid diverticulosis without active inflammatory changes. There is no bowel obstruction or active inflammation. The appendix is not visualized with certainty. No inflammatory changes identified in the right lower quadrant. Vascular/Lymphatic: Moderate aortoiliac atherosclerotic disease. The IVC is grossly unremarkable. No portal venous gas. There is no adenopathy. Reproductive: The uterus is anteverted. There is mild thickened appearance of the endometrium measuring approximately 7 mm in thickness. Dedicated pelvic ultrasound is recommended  for better evaluation on a nonemergent/outpatient basis. No adnexal masses identified. Other: There is a 3 cm umbilical hernia with protrusion of anterior wall of a segment of colon. No obstruction Musculoskeletal: There is osteopenia with degenerative  changes of the spine. Grade 1 L5-S1 retrolisthesis. No acute osseous pathology. IMPRESSION: 1. No acute/traumatic intrathoracic, abdominal, or pelvic pathology. 2. Thickened appearance of the endometrium. Further evaluation with pelvic ultrasound on a nonemergent/outpatient basis recommended. 3.  Aortic Atherosclerosis (ICD10-I70.0). Electronically Signed   By: Anner Crete M.D.   On: 03/05/2019 17:35   CT Cervical Spine Wo Contrast  Result Date: 03/05/2019 CLINICAL DATA:  Fall occurring yesterday. Patient found lying on the floor. Headache. EXAM: CT HEAD WITHOUT CONTRAST CT CERVICAL SPINE WITHOUT CONTRAST TECHNIQUE: Multidetector CT imaging of the head and cervical spine was performed following the standard protocol without intravenous contrast. Multiplanar CT image reconstructions of the cervical spine were also generated. COMPARISON:  11/30/2018 FINDINGS: CT HEAD FINDINGS Brain: No evidence of acute infarction, hemorrhage, hydrocephalus, extra-axial collection or mass lesion/mass effect. There are changes from a previous occipital craniotomy. Metal vascular clips and possible coil material as well as calcifications are noted along the foramen magnum, stable compared to the prior head CT. Mild periventricular white matter hypoattenuation is noted consistent with chronic microvascular ischemic change. Vascular: Fairly prominent carotid calcifications. No hyperattenuating artery. Skull: No acute fracture or bone lesion. Sinuses/Orbits: Globes and orbits are unremarkable. Sinuses and mastoid air cells are clear. Other: None. CT CERVICAL SPINE FINDINGS Alignment: Normal. Skull base and vertebrae: No acute fracture. No primary bone lesion or focal pathologic process. Stable surgical changes from a previous suboccipital craniotomy. Soft tissues and spinal canal: No prevertebral fluid or swelling. No visible canal hematoma. Disc levels: Spondylotic disc bulging throughout the cervical spine. Moderate loss of disc  height at C4-C5 and C5-C6 with moderate to severe loss of disc height at C6-C7. No convincing disc herniation. Upper chest: No acute findings.  Scarring in the lung apices. Other: None. IMPRESSION: HEAD CT 1. No acute intracranial abnormalities. 2. No skull fracture. CERVICAL CT 1. No fracture or acute finding. Electronically Signed   By: Lajean Manes M.D.   On: 03/05/2019 17:22   CT Abdomen Pelvis W Contrast  Result Date: 03/05/2019 CLINICAL DATA:  86 year old female with trauma. EXAM: CT CHEST, ABDOMEN, AND PELVIS WITH CONTRAST TECHNIQUE: Multidetector CT imaging of the chest, abdomen and pelvis was performed following the standard protocol during bolus administration of intravenous contrast. CONTRAST:  127m OMNIPAQUE IOHEXOL 300 MG/ML  SOLN COMPARISON:  Chest radiograph dated 11/30/2018. FINDINGS: Evaluation of this exam is limited due to respiratory motion artifact. CT CHEST FINDINGS Cardiovascular: There is no cardiomegaly or pericardial effusion. Three-vessel coronary vascular calcification. There is calcification of the mitral annulus. There is moderate atherosclerotic calcification of the thoracic aorta. No aneurysmal dilatation or dissection. The origins of the great vessels of the aortic arch appear patent as visualized. A the central pulmonary arteries are grossly unremarkable for the degree of opacification. Mediastinum/Nodes: There is no hilar or mediastinal adenopathy. The esophagus is grossly unremarkable. No mediastinal fluid collection. There is an 8 mm right thyroid hypodense nodule. Lungs/Pleura: Bilateral lower lobe linear atelectasis/scarring noted. Confluent areas of hazy density throughout the lungs likely represents areas of atelectatic changes or air trapping related to underlying small vessel versus small airway disease. No lobar consolidation, pleural effusion, or pneumothorax. The central airways are patent. Musculoskeletal: No acute osseous pathology. Osteopenia with degenerative  changes of the spine. A  focus of sclerosis involving the left posterior seventh rib at the costovertebral junction likely a bone island. CT ABDOMEN PELVIS FINDINGS No intra-abdominal free air or free fluid. Hepatobiliary: The liver is unremarkable. No intrahepatic biliary ductal dilatation. Cholecystectomy. Mildly dilated common bile duct, likely post cholecystectomy. No retained calcified stone noted in the central CBD. Pancreas: Unremarkable. No pancreatic ductal dilatation or surrounding inflammatory changes. Spleen: Normal in size without focal abnormality. Adrenals/Urinary Tract: The adrenal glands are unremarkable. There is no hydronephrosis on either side. There is symmetric enhancement and excretion of contrast by both kidneys. The visualized ureters and urinary bladder appear unremarkable. Stomach/Bowel: There is sigmoid diverticulosis without active inflammatory changes. There is no bowel obstruction or active inflammation. The appendix is not visualized with certainty. No inflammatory changes identified in the right lower quadrant. Vascular/Lymphatic: Moderate aortoiliac atherosclerotic disease. The IVC is grossly unremarkable. No portal venous gas. There is no adenopathy. Reproductive: The uterus is anteverted. There is mild thickened appearance of the endometrium measuring approximately 7 mm in thickness. Dedicated pelvic ultrasound is recommended for better evaluation on a nonemergent/outpatient basis. No adnexal masses identified. Other: There is a 3 cm umbilical hernia with protrusion of anterior wall of a segment of colon. No obstruction Musculoskeletal: There is osteopenia with degenerative changes of the spine. Grade 1 L5-S1 retrolisthesis. No acute osseous pathology. IMPRESSION: 1. No acute/traumatic intrathoracic, abdominal, or pelvic pathology. 2. Thickened appearance of the endometrium. Further evaluation with pelvic ultrasound on a nonemergent/outpatient basis recommended. 3.  Aortic  Atherosclerosis (ICD10-I70.0). Electronically Signed   By: Anner Crete M.D.   On: 03/05/2019 17:35   DG Hip Unilat With Pelvis 2-3 Views Right  Result Date: 03/05/2019 CLINICAL DATA:  Status post fall yesterday. EXAM: DG HIP (WITH OR WITHOUT PELVIS) 2-3V RIGHT COMPARISON:  None. FINDINGS: Generalized osteopenia. No fracture or dislocation. No aggressive osseous lesion. Mild osteoarthritis bilateral SI joints. IMPRESSION: No acute osseous injury of the right hip. Given the patient's age and osteopenia, if there is persistent clinical concern for an occult hip fracture, a MRI of the hip is recommended for increased sensitivity. Electronically Signed   By: Kathreen Devoid   On: 03/05/2019 15:34       Discharge Exam: Vitals:   03/10/19 0616 03/10/19 0900  BP: (!) 176/66 (!) 160/62  Pulse: 76 80  Resp: 18 18  Temp: 98.1 F (36.7 C) 98.2 F (36.8 C)  SpO2: 97% 98%   Vitals:   03/09/19 1743 03/09/19 2304 03/10/19 0616 03/10/19 0900  BP: (!) 179/69 (!) 184/55 (!) 176/66 (!) 160/62  Pulse: 82 77 76 80  Resp: 18 18 18 18   Temp: 98.2 F (36.8 C) 97.8 F (36.6 C) 98.1 F (36.7 C) 98.2 F (36.8 C)  TempSrc: Oral Oral Oral Oral  SpO2: 98% 99% 97% 98%  Weight:      Height:        General: Pt is alert, awake, not in acute distress Cardiovascular: RRR, S1/S2 +, no rubs, no gallops Respiratory: CTA bilaterally, no wheezing, no rhonchi Abdominal: Soft, NT, ND, bowel sounds + Extremities: no edema, no cyanosis    The results of significant diagnostics from this hospitalization (including imaging, microbiology, ancillary and laboratory) are listed below for reference.     Microbiology: Recent Results (from the past 240 hour(s))  Urine culture     Status: Abnormal   Collection Time: 03/05/19  4:48 PM   Specimen: Urine, Random  Result Value Ref Range Status   Specimen Description URINE, RANDOM  Final   Special Requests   Final    NONE Performed at [facility], [place]  [address], [place], [place] [address]    Culture >=100,000 COLONIES/mL STAPHYLOCOCCUS EPIDERMIDIS (A)  Final   Report Status 03/07/2019 FINAL  Final   Organism ID, Bacteria STAPHYLOCOCCUS EPIDERMIDIS (A)  Final      Susceptibility   Staphylococcus epidermidis - MIC*    CIPROFLOXACIN >=8 RESISTANT Resistant     GENTAMICIN <=0.5 SENSITIVE Sensitive     NITROFURANTOIN <=16 SENSITIVE Sensitive     OXACILLIN <=0.25 SENSITIVE Sensitive     TETRACYCLINE 4 SENSITIVE Sensitive     VANCOMYCIN 1 SENSITIVE Sensitive     TRIMETH/SULFA 80 RESISTANT Resistant     CLINDAMYCIN >=8 RESISTANT Resistant     RIFAMPIN <=0.5 SENSITIVE Sensitive     Inducible Clindamycin NEGATIVE Sensitive     * >=100,000 COLONIES/mL STAPHYLOCOCCUS EPIDERMIDIS  SARS CORONAVIRUS 2 (TAT 6-24 HRS) Nasopharyngeal Nasopharyngeal Swab     Status: None   Collection Time: 03/05/19  6:43 PM   Specimen: Nasopharyngeal Swab  Result Value Ref Range Status   SARS Coronavirus 2 NEGATIVE NEGATIVE Final    Comment: (NOTE) SARS-CoV-2 target nucleic acids are NOT DETECTED. The SARS-CoV-2 RNA is generally detectable in upper and lower respiratory specimens during the acute phase of infection. Negative results do not preclude SARS-CoV-2 infection, do not rule out co-infections with other pathogens, and should not be used as the sole basis for treatment or other patient management decisions. Negative results must be combined with clinical observations, patient history, and epidemiological information. The expected result is Negative. Fact Sheet for Patients: SugarRoll.be Fact Sheet for Healthcare Providers: https://www.woods-mathews.com/ This test is not yet approved or cleared by the [place] FDA and  has been authorized for detection and/or diagnosis of SARS-CoV-2 by FDA under an Emergency Use Authorization (EUA). This EUA will remain  in effect (meaning this test can be used) for the duration of  the COVID-19 declaration under Section 56 4(b)(1) of the Act, 21 U.S.C. section 360bbb-3(b)(1), unless the authorization is terminated or revoked sooner. Performed at [facility], [place] [address] [place], [place] [address]      Labs: BNP (last 3 results) No results for input(s): BNP in the last 8760 hours. Basic Metabolic Panel: Recent Labs  Lab 03/05/19 1445 03/05/19 1453 03/06/19 0440 03/07/19 0421 03/08/19 0433 03/09/19 0751 03/10/19 0656  NA 139   < > 142 142 144 141 142  K 4.4   < > 3.2* 3.1* 3.5 3.4* 3.8  CL 106   < > 113* 113* 115* 110 109  CO2 20*   < > 17* 19* 22 23 22   GLUCOSE 150*   < > 119* 115* 112* 129* 128*  BUN 30*   < > 26* 27* 23 13 10   CREATININE 0.82   < > 0.71 0.70 0.63 0.61 0.61  CALCIUM 10.1   < > 8.9 9.0 8.7* 8.8* 9.2  MG 2.0  --   --  1.7  --   --  1.7  PHOS 3.0  --   --   --   --   --   --    < > = values in this interval not displayed.   Liver Function Tests: Recent Labs  Lab 03/05/19 1445 03/06/19 0440 03/07/19 0421  AST 127* 111* 73*  ALT 46* 48* 48*  ALKPHOS 91 66 68  BILITOT 1.2 1.0 1.0  PROT 6.3* 4.9* 5.0*  ALBUMIN 3.9 2.9* 2.7*   Recent Labs  Lab 03/05/19 1445  LIPASE 14   No results for input(s): AMMONIA in the last 168 hours. CBC: Recent Labs  Lab 03/05/19 1445 03/05/19 1445 03/05/19 1453 03/06/19 0440 03/07/19 0421 03/08/19 0433 03/09/19 0751  WBC 21.1*  --   --  17.5* 11.4* 8.2 6.8  NEUTROABS 18.2*  --   --   --   --  5.4  --   HGB 13.8   < > 13.9 11.7* 11.5* 11.2* 11.9*  HCT 42.8   < > 41.0 35.9* 34.9* 34.6* 36.9  MCV 92.0  --   --  91.8 91.4 92.3 92.5  PLT 193  --   --  141* 128* 150 168   < > = values in this interval not displayed.   Cardiac Enzymes: Recent Labs  Lab 03/05/19 1445 03/06/19 0440 03/09/19 0751  CKTOTAL 3,107* 1,655* 204   BNP: Invalid input(s): POCBNP CBG: Recent Labs  Lab 03/09/19 0651 03/09/19 1129 03/09/19 1621 03/09/19 2300 03/10/19 0738  GLUCAP 101* 177* 123*  114* 120*   D-Dimer No results for input(s): DDIMER in the last 72 hours. Hgb A1c No results for input(s): HGBA1C in the last 72 hours. Lipid Profile No results for input(s): CHOL, HDL, LDLCALC, TRIG, CHOLHDL, LDLDIRECT in the last 72 hours. Thyroid function studies No results for input(s): TSH, T4TOTAL, T3FREE, THYROIDAB in the last 72 hours.  Invalid input(s): FREET3 Anemia work up No results for input(s): VITAMINB12, FOLATE, FERRITIN, TIBC, IRON, RETICCTPCT in the last 72 hours. Urinalysis    Component Value Date/Time   COLORURINE YELLOW 03/05/2019 1700   APPEARANCEUR HAZY (A) 03/05/2019 1700   LABSPEC 1.024 03/05/2019 1700   PHURINE 7.0 03/05/2019 1700   GLUCOSEU 150 (A) 03/05/2019 1700   GLUCOSEU NEGATIVE 01/16/2011 0914   HGBUR MODERATE (A) 03/05/2019 1700   BILIRUBINUR NEGATIVE 03/05/2019 1700   BILIRUBINUR neg 05/01/2017 1220   KETONESUR 20 (A) 03/05/2019 1700   PROTEINUR >=300 (A) 03/05/2019 1700   UROBILINOGEN 0.2 05/01/2017 1220   UROBILINOGEN 0.2 04/14/2012 0930   NITRITE POSITIVE (A) 03/05/2019 1700   LEUKOCYTESUR TRACE (A) 03/05/2019 1700   Sepsis Labs Invalid input(s): PROCALCITONIN,  WBC,  LACTICIDVEN Microbiology Recent Results (from the past 240 hour(s))  Urine culture     Status: Abnormal   Collection Time: 03/05/19  4:48 PM   Specimen: Urine, Random  Result Value Ref Range Status   Specimen Description URINE, RANDOM  Final   Special Requests   Final    NONE Performed at [facility], [place] [address] [place], [place] [address]    Culture >=100,000 COLONIES/mL STAPHYLOCOCCUS EPIDERMIDIS (A)  Final   Report Status 03/07/2019 FINAL  Final   Organism ID, Bacteria STAPHYLOCOCCUS EPIDERMIDIS (A)  Final      Susceptibility   Staphylococcus epidermidis - MIC*    CIPROFLOXACIN >=8 RESISTANT Resistant     GENTAMICIN <=0.5 SENSITIVE Sensitive     NITROFURANTOIN <=16 SENSITIVE Sensitive     OXACILLIN <=0.25 SENSITIVE Sensitive     TETRACYCLINE 4  SENSITIVE Sensitive     VANCOMYCIN 1 SENSITIVE Sensitive     TRIMETH/SULFA 80 RESISTANT Resistant     CLINDAMYCIN >=8 RESISTANT Resistant     RIFAMPIN <=0.5 SENSITIVE Sensitive     Inducible Clindamycin NEGATIVE Sensitive     * >=100,000 COLONIES/mL STAPHYLOCOCCUS EPIDERMIDIS  SARS CORONAVIRUS 2 (TAT 6-24 HRS) Nasopharyngeal Nasopharyngeal Swab     Status: None   Collection Time:  03/05/19  6:43 PM   Specimen: Nasopharyngeal Swab  Result Value Ref Range Status   SARS Coronavirus 2 NEGATIVE NEGATIVE Final    Comment: (NOTE) SARS-CoV-2 target nucleic acids are NOT DETECTED. The SARS-CoV-2 RNA is generally detectable in upper and lower respiratory specimens during the acute phase of infection. Negative results do not preclude SARS-CoV-2 infection, do not rule out co-infections with other pathogens, and should not be used as the sole basis for treatment or other patient management decisions. Negative results must be combined with clinical observations, patient history, and epidemiological information. The expected result is Negative. Fact Sheet for Patients: SugarRoll.be Fact Sheet for Healthcare Providers: https://www.woods-mathews.com/ This test is not yet approved or cleared by the [place] FDA and  has been authorized for detection and/or diagnosis of SARS-CoV-2 by FDA under an Emergency Use Authorization (EUA). This EUA will remain  in effect (meaning this test can be used) for the duration of the COVID-19 declaration under Section 56 4(b)(1) of the Act, 21 U.S.C. section 360bbb-3(b)(1), unless the authorization is terminated or revoked sooner. Performed at [facility], [place] [address], [place], [place] [address]      Time coordinating discharge: 35 minutes  SIGNED:   Rodena Goldmann, DO [facility] 03/10/2019, 10:41 AM  If 7PM-7AM, please contact night-coverage [email or web address]

## 2019-03-10 NOTE — Progress Notes (Signed)
Kristina Wilkinson to be discharged to [facility] per MD order. Discussed prescriptions and follow up appointments with the patient. Prescriptions given to patient; medication list explained in detail. Patient verbalized understanding.  Skin clean, dry and intact without evidence of skin break down, no evidence of skin tears noted. IV catheter discontinued intact. Site without signs and symptoms of complications. Dressing and pressure applied. Pt denies pain at the site currently. No complaints noted.  Patient free of lines, drains, and wounds.   An After Visit Summary (AVS) was printed and given to the carelink. Patient escorted via stretcher, and discharged home via  [facility] ambulance  Starla Link, RN

## 2019-03-10 NOTE — Progress Notes (Signed)
OT Treatment  Pt making progress towards OT goals and is motivated to work with therapies and to progress OOB, stating "I want to walk". Pt continues to require heavy two person assist for functional transfers, but tolerating transfer to [facility] and then able to take few steps to recliner, overall requiring maxA+2 (HHA) to complete. Pt continues to require up to [place] for toileting ADL given considerable weakness, decreased standing balance and endurance. Feel she remains appropriate for SNF level therapies at time of discharge. Will continue to follow acutely.   03/10/19 0921  OT Visit Information  Last OT Received On 03/10/19  Assistance Needed +1 (2 for transfers OOB)  PT/OT/SLP Co-Evaluation/Treatment Yes  Reason for Co-Treatment For patient/therapist safety;To address functional/ADL transfers  OT goals addressed during session ADL's and self-care  History of Present Illness Patient is a 86 y/o female with PMH of DM 2, HTN, hx of DVT presenting with fall at home with prolonged downtime (R LE found flexed back and outwards).  Found with sepsis due to UTI and mild rhabdomyolysis.  Imanaging to R hip and knee negative, CT spine negative and CT head negative.   Precautions  Precautions Fall  Pain Assessment  Pain Assessment Faces  Faces Pain Scale 0  Cognition  Arousal/Alertness Awake/alert  Behavior During Therapy WFL for tasks assessed/performed  Overall Cognitive Status Impaired/Different from baseline  Area of Impairment Attention;Memory;Following commands;Safety/judgement;Awareness;Problem solving  Current Attention Level Selective  Memory Decreased recall of precautions  Following Commands Follows one step commands inconsistently;Follows one step commands with increased time  Safety/Judgement Decreased awareness of deficits;Decreased awareness of safety  Awareness Emergent  Problem Solving Slow processing;Requires verbal cues;Requires tactile cues  General Comments requires cues for  redirection, pt motivated to work with therapies today  ADL  Overall ADL's  Needs assistance/impaired  Toilet Transfer Maximal assistance;+2 for physical assistance;+2 for safety/equipment;Stand-pivot;BSC  Toileting- Clothing Manipulation and Hygiene Total assistance;+2 for physical assistance;+2 for safety/equipment;Sit to/from stand  Toileting - Clothing Manipulation Details (indicate cue type and reason) requires assist for pericare in standing, assist for gown management  Functional mobility during ADLs Maximal assistance;+2 for physical assistance;+2 for safety/equipment (HHA, stand pivot transfers)  Bed Mobility  Overal bed mobility Needs Assistance  Bed Mobility Supine to Sit  Supine to sit Mod assist;+2 for physical assistance;+2 for safety/equipment  General bed mobility comments pt able to move RLE towards EOB, assist for LLE and trunk elevation, assist to scoot hips  Balance  Overall balance assessment History of Falls;Needs assistance  Sitting-balance support Feet supported;Single extremity supported  Sitting balance-Leahy Scale Fair  Standing balance support Bilateral upper extremity supported  Standing balance-Leahy Scale Zero  Standing balance comment +35maxA for standing balance  Restrictions  Weight Bearing Restrictions No  Transfers  Overall transfer level Needs assistance  Equipment used 2 person hand held assist  Transfers Sit to/from Stand;Stand Pivot Transfers  Sit to Stand Max assist;+2 physical assistance;+2 safety/equipment  Stand pivot transfers Max assist;+2 physical assistance;+2 safety/equipment  General transfer comment boosting and steadying assist, stand pivot to [facility] and then able to take few steps to recliner  OT - End of Session  Equipment Utilized During Treatment Gait belt  Activity Tolerance Patient tolerated treatment well  Patient left in chair;with call bell/phone within reach;with chair alarm set  Nurse Communication Mobility status  OT  Assessment/Plan  OT Plan Discharge plan remains appropriate  OT Visit Diagnosis Other abnormalities of gait and mobility (R26.89);Muscle weakness (generalized) (M62.81);Pain;Other symptoms and signs involving cognitive function;History  of falling (Z91.81)  Pain - Right/Left Right  Pain - part of body Knee;Leg  OT Frequency (ACUTE ONLY) Min 2X/week  Follow Up Recommendations SNF;Supervision/Assistance - 24 hour  OT Equipment 3 in 1 bedside commode (defer to next venue)  AM-PAC OT "6 Clicks" Daily Activity Outcome Measure (Version 2)  Help from another person eating meals? 2  Help from another person taking care of personal grooming? 2  Help from another person toileting, which includes using toliet, bedpan, or urinal? 1  Help from another person bathing (including washing, rinsing, drying)? 2  Help from another person to put on and taking off regular upper body clothing? 2  Help from another person to put on and taking off regular lower body clothing? 1  6 Click Score 10  OT Goal Progression  Progress towards OT goals Progressing toward goals  Acute Rehab OT Goals  Patient Stated Goal get stronger and go home  OT Goal Formulation With patient  Time For Goal Achievement 03/20/19  Potential to Achieve Goals Good  OT Time Calculation  OT Start Time (ACUTE ONLY) 0841  OT Stop Time (ACUTE ONLY) 0915  OT Time Calculation (min) 34 min  OT General Charges  $OT Visit 1 Visit  OT Treatments  $Self Care/Home Management  8-22 mins    Lou Cal, OT Supplemental Rehabilitation Services Pager [phone] Office [phone]

## 2019-03-10 NOTE — Progress Notes (Signed)
Report called and given to Monet, RN at [facility].  

## 2019-03-10 NOTE — TOC Transition Note (Signed)
Transition of Care [facility]) - CM/SW Discharge Note 03/10/19 - Discharged to [facility]   Patient Details  Name: Kristina Wilkinson MRN: [id number] Date of Birth: 1927-05-12  Transition of Care [facility] CM/SW Contact:  Sable Feil, LCSW Phone Number: 03/10/2019, 2:04 PM   Clinical Narrative: Patient medically stable for discharge and transitioning to [facility] for [place] rehab. Patient's son Kristina Wilkinson informed of ST rehab, however wanted his mother to make the decision. Kristina Wilkinson had only one facility offer and was informed of this. Patient stated that she needed to think about what she was going to do and CSW explained that she was ready for discharge and only had one facility that would accept her for rehabilitation, and patient nodded understanding.       Final next level of care: Skilled Nursing Facility([facility] - Room 505) Barriers to Discharge: Barriers Resolved   Patient Goals and CMS Choice Patient states their goals for this hospitalization and ongoing recovery are:: Soon agreeable to [place] rehab, however wanted CSW to talk with patient regarding the facility choice CMS Medicare.gov Compare Post Acute Care list provided to:: Other (Comment Required)(Not provided, patient had one facility choice) Choice offered to / list presented to : Patient(Kristina Wilkinson advised of facility making offfer for ST rehab and explained what happens during [place] rehab to patient)  Discharge Placement   Existing PASRR number confirmed : 03/07/19          Patient chooses bed at: [facility] Patient to be transferred to facility by: Ambulance Name of family member notified: Kristina Wilkinson [id number] Patient and family notified of of transfer: 03/10/19  Discharge Plan and Services In-house Referral: Clinical Social Work   Post Acute Care Choice: [place]                             Social Determinants of Health (SDOH)  Interventions  No SDOH interventions needed prior to discharge.   Readmission Risk Interventions No flowsheet data found.

## 2019-03-10 NOTE — Progress Notes (Signed)
Physical Therapy Treatment Patient Details Name: Kristina Wilkinson MRN: [id number] DOB: 13-Jan-1928 Today's Date: 03/10/2019    History of Present Illness Patient is a 86 y/o female with PMH of DM 2, HTN, hx of DVT presenting with fall at home with prolonged downtime (R LE found flexed back and outwards).  Found with sepsis due to UTI and mild rhabdomyolysis. Imaging to R hip and knee negative, CT spine negative and CT head negative.     PT Comments    Pt progressing towards physical therapy goals. Motivated to participate with therapy today, stating "I want to walk, I want to get out of this bed and sit up for a while." Pt does require +2 max assist for OOB mobility at this time. Would like to attempt with RW next session in an effort to improve gait distance and lessen support provided by therapists. Pt asking several times if we think she should go to SNF at d/c, and we had an extensive, repetitive conversation regarding therapy's recommendations. She voices agreement with SNF level rehab prior to return home. Will continue to follow and progress as able per POC.     Follow Up Recommendations  SNF     Equipment Recommendations  None recommended by PT    Recommendations for Other Services       Precautions / Restrictions Precautions Precautions: Fall Precaution Comments: impaired cognition, skin breakdown Restrictions Weight Bearing Restrictions: No    Mobility  Bed Mobility Overal bed mobility: Needs Assistance Bed Mobility: Supine to Sit     Supine to sit: Mod assist;+2 for physical assistance;+2 for safety/equipment     General bed mobility comments: pt able to move RLE towards EOB, assist for LLE and trunk elevation, assist to scoot hips  Transfers Overall transfer level: Needs assistance Equipment used: 2 person hand held assist Transfers: Sit to/from [employer] Transfers Sit to Stand: Max assist;+2 physical assistance;+2 safety/equipment Stand pivot transfers:  Max assist;+2 physical assistance;+2 safety/equipment       General transfer comment: boosting and steadying assist   Ambulation/Gait Ambulation/Gait assistance: Max assist;+2 physical assistance;+2 safety/equipment Gait Distance (Feet): 5 Feet Assistive device: 2 person hand held assist Gait Pattern/deviations: Decreased stride length;Shuffle;Trunk flexed;Narrow base of support Gait velocity: Decreased Gait velocity interpretation: <1.31 ft/sec, indicative of household ambulator General Gait Details: Pt motivated to try and ambulate this session. We were able to faciliate about 5 feet of shuffling gait from [facility] to recliner chair. Increased assist to turn and control descent to chair.    Stairs             Wheelchair Mobility    Modified Rankin (Stroke Patients Only)       Balance Overall balance assessment: History of Falls;Needs assistance Sitting-balance support: Feet supported;Single extremity supported Sitting balance-Leahy Scale: Fair Sitting balance - Comments: required PT to support back then asked to return to bed   Standing balance support: Bilateral upper extremity supported Standing balance-Leahy Scale: Zero Standing balance comment: +2 max A for standing balance                            Cognition Arousal/Alertness: Awake/alert Behavior During Therapy: WFL for tasks assessed/performed Overall Cognitive Status: Impaired/Different from baseline Area of Impairment: Attention;Memory;Following commands;Safety/judgement;Awareness;Problem solving                   Current Attention Level: Selective Memory: Decreased recall of precautions Following Commands: Follows one step commands inconsistently;Follows  one step commands with increased time Safety/Judgement: Decreased awareness of deficits;Decreased awareness of safety Awareness: Emergent Problem Solving: Slow processing;Requires verbal cues;Requires tactile cues General Comments:  requires cues for redirection, pt motivated to work with therapies today      Exercises General Exercises - Lower Extremity Ankle Circles/Pumps: AAROM;5 reps Quad Sets: AROM;10 reps Heel Slides: AAROM;10 reps Hip ABduction/ADduction: AAROM;10 reps Straight Leg Raises: AAROM;10 reps Hip Flexion/Marching: AAROM;10 reps    General Comments        Pertinent Vitals/Pain Pain Assessment: Faces Faces Pain Scale: No hurt    Home Living                      Prior Function            PT Goals (current goals can now be found in the care plan section) Acute Rehab PT Goals Patient Stated Goal: get stronger and go home PT Goal Formulation: Patient unable to participate in goal setting Time For Goal Achievement: 03/20/19 Potential to Achieve Goals: Good Progress towards PT goals: Progressing toward goals    Frequency    Min 2X/week      PT Plan Current plan remains appropriate    Co-evaluation PT/OT/SLP Co-Evaluation/Treatment: Yes Reason for Co-Treatment: For patient/therapist safety;To address functional/ADL transfers PT goals addressed during session: Mobility/safety with mobility;Balance;Proper use of DME OT goals addressed during session: ADL's and self-care      AM-PAC PT "6 Clicks" Mobility   Outcome Measure  Help needed turning from your back to your side while in a flat bed without using bedrails?: A Lot Help needed moving from lying on your back to sitting on the side of a flat bed without using bedrails?: A Lot Help needed moving to and from a bed to a chair (including a wheelchair)?: Total Help needed standing up from a chair using your arms (e.g., wheelchair or bedside chair)?: Total Help needed to walk in hospital room?: Total Help needed climbing 3-5 steps with a railing? : Total 6 Click Score: 8    End of Session Equipment Utilized During Treatment: Gait belt Activity Tolerance: Patient tolerated treatment well Patient left: in chair;with  call bell/phone within reach;with chair alarm set Nurse Communication: Mobility status(RN present for a portion of session) PT Visit Diagnosis: Muscle weakness (generalized) (M62.81);Difficulty in walking, not elsewhere classified (R26.2);History of falling (Z91.81);Adult, failure to thrive (R62.7);Pain Pain - Right/Left: Right Pain - part of body: Leg     Time: [id number] PT Time Calculation (min) (ACUTE ONLY): 34 min  Charges:  $Gait Training: 8-22 mins                     Kristina Wilkinson, PT, DPT Acute Rehabilitation Services Pager: [phone] Office: [phone]    Thelma Comp 03/10/2019, 10:01 AM

## 2019-03-11 ENCOUNTER — Non-Acute Institutional Stay (SKILLED_NURSING_FACILITY): Payer: Medicare Other | Admitting: Internal Medicine

## 2019-03-11 ENCOUNTER — Encounter: Payer: Self-pay | Admitting: Internal Medicine

## 2019-03-11 DIAGNOSIS — I10 Essential (primary) hypertension: Secondary | ICD-10-CM | POA: Diagnosis not present

## 2019-03-11 DIAGNOSIS — A419 Sepsis, unspecified organism: Secondary | ICD-10-CM | POA: Diagnosis not present

## 2019-03-11 DIAGNOSIS — N390 Urinary tract infection, site not specified: Secondary | ICD-10-CM

## 2019-03-11 DIAGNOSIS — E119 Type 2 diabetes mellitus without complications: Secondary | ICD-10-CM

## 2019-03-11 DIAGNOSIS — T796XXS Traumatic ischemia of muscle, sequela: Secondary | ICD-10-CM

## 2019-03-11 NOTE — Progress Notes (Signed)
Location:    [facility] Room Number: 505/P Place of Service:  SNF [phone]) Provider:  Evette Cristal, FNP  Patient Care Team: Elby Beck, FNP as PCP - General (Nurse Practitioner) Garald Balding, MD (Orthopedic Surgery)  Extended Emergency Contact Information Primary Emergency Contact: Rhew,Tony Home Phone: [phone] Mobile Phone: [phone] Relation: Son Secondary Emergency Contact: [facility] Phone: [phone] Mobile Phone: [phone] Relation: Nephew  Code Status:  Full Code Goals of care: Advanced Directive information Advanced Directives 03/11/2019  Does Patient Have a Medical Advance Directive? Yes  Type of Advance Directive (No Data)  Does patient want to make changes to medical advance directive? No - Patient declined  Would patient like information on creating a medical advance directive? -     Chief Complaint  Patient presents with  . Hospitalization Follow-up    Hospitalization Follow Up  Status post hospitalization for UTI as well as rhabdomyolysis from fall  HPI:  Pt is a 86 y.o. female seen today for a hospital f/u after apparently being found at home on the floor-apparently she was found with her right leg flexed back behind her and outwards-she was placed on a backboard and brought to the ED for evaluation.  Work-up did not show fracture-labs did show an elevated white count of over 20,000 and urinalysis was suspicious for UTI-she was treated with IV Rocephin and transitioned to [place].  In regards to rhabdomylolysis after being on the floor for prolonged.  This improved with IV hydration.  CK came down from 1655 down to 204.  Her other diagnoses include hypertension she is on Norvasc hydralazine and lisinopril as well as type 2 diabetes diet-controlled and history of restless legs for which she continues on Requip.  Currently she is she is sitting in her chair comfortably  does not really have any complaints she is bright and alert  Past Medical History:  Diagnosis Date  . Arthritis   . CNS aneurysm 1983   s/p clipping  . Diabetes mellitus type II    diet controlled   . DVT (deep venous thrombosis) ([place]) 10/15  . Elevated glucose   . Hypertension   . Insomnia   . Knee pain    Left Dr. Meyer Cory  . LBP (low back pain)   . PN (peripheral neuropathy)    unknown etiol   Past Surgical History:  Procedure Laterality Date  . CEREBRAL ANEURYSM REPAIR     initial repair in the 1980s, with second surgery in the 1990s  . CHOLECYSTECTOMY    . JOINT REPLACEMENT    . KNEE ARTHROPLASTY  04/2009   Left  . TOTAL KNEE ARTHROPLASTY    . TUBAL LIGATION      Allergies  Allergen Reactions  . Gabapentin Other (See Comments)    Made the legs "go numb"  . Lyrica [Pregabalin] Other (See Comments)    "Made me pass out"  . Other Other (See Comments)    "MD mixed a blood pressure medication with the Clinoril I was taking and I couldn't walk"  . Penicillins Other (See Comments)    Has patient had a PCN reaction causing immediate rash, facial/tongue/throat swelling, SOB or lightheadedness with hypotension: Unk- exact reaction not recalled, but is allergic Has patient had a PCN reaction causing severe rash involving mucus membranes or skin necrosis: Unk Has patient had a PCN reaction that required hospitalization: Unk Has patient had a PCN reaction occurring within the last 10  years: Yes If all of the above answers are "NO", then may proceed with Cephalosporin use.   . Vit D-Vit E-Safflower Oil Other (See Comments)    "Made me achy and sick"    Allergies as of 03/11/2019      Reactions   Gabapentin Other (See Comments)   Made the legs "go numb"   Lyrica [pregabalin] Other (See Comments)   "Made me pass out"   Other Other (See Comments)   "MD mixed a blood pressure medication with the Clinoril I was taking and I couldn't walk"   Penicillins Other (See Comments)     Has patient had a PCN reaction causing immediate rash, facial/tongue/throat swelling, SOB or lightheadedness with hypotension: Unk- exact reaction not recalled, but is allergic Has patient had a PCN reaction causing severe rash involving mucus membranes or skin necrosis: Unk Has patient had a PCN reaction that required hospitalization: Unk Has patient had a PCN reaction occurring within the last 10 years: Yes If all of the above answers are "NO", then may proceed with Cephalosporin use.   Vit D-vit E-safflower Oil Other (See Comments)   "Made me achy and sick"      Medication List       Accurate as of March 11, 2019  4:16 PM. If you have any questions, ask your nurse or doctor.        amLODipine 10 MG tablet Commonly known as: NORVASC Take 1 tablet (10 mg total) by mouth daily.   hydrALAZINE 25 MG tablet Commonly known as: APRESOLINE Take 1 tablet (25 mg total) by mouth every 8 (eight) hours.   lisinopril 20 MG tablet Commonly known as: ZESTRIL Take 1 tablet (20 mg total) by mouth daily.   NON FORMULARY DIET: REGULAR, NAS, CCD, HEART HEALTHY DIET   rOPINIRole 0.25 MG tablet Commonly known as: REQUIP Take 0.25 mg by mouth at bedtime. What changed: Another medication with the same name was removed. Continue taking this medication, and follow the directions you see here. Changed by: Granville Lewis, PA-C       Review of Systems   In general she is not complaining of any fever or chills.  Skin does not complain of rashes or itching or diaphoresis.  Head ears eyes nose mouth and throat is not complaining of visual changes or sore throat.  Respiratory is not complaining of shortness of breath or having a cough.  Cardiac does not complain of chest pain has some mild lower extremity edema/obese changes.  GI is not complaining of abdominal pain nausea vomiting diarrhea constipation.  GU is not complaining of dysuria.  Musculoskeletal does not complain of joint pain  currently.  Neurologic is not complaining of dizziness headache or having syncope.  And psych does not complain of being depressed or anxious.    Immunization History  Administered Date(s) Administered  . Pneumococcal Conjugate-13 12/12/2016  . Pneumococcal Polysaccharide-23 11/16/2008  . Tdap 12/14/2014, 07/13/2016   Pertinent  Health Maintenance Due  Topic Date Due  . FOOT EXAM  04/17/1939  . DEXA SCAN  04/17/1994  . OPHTHALMOLOGY EXAM  01/12/2018  . INFLUENZA VACCINE  09/13/2018  . HEMOGLOBIN A1C  09/03/2019  . PNA vac Low Risk Adult  Completed   Fall Risk  09/11/2018 04/24/2017 12/12/2016 10/14/2015 07/06/2014  Falls in the past year? (No Data) Yes Yes No No  Comment [facility] Telephone Survey: data to providers prior to load fell after losing balance; skull fracture led to hospitalization - [facility] Telephone Survey:  data to providers prior to load -  Number falls in past yr: (No Data) 1 1 - -  Comment [facility] Telephone Survey Actual Response =  - - - -  Injury with Fall? - Yes Yes - -  Risk for fall due to : - Impaired balance/gait;Impaired mobility - - -   Functional Status Survey:    Vitals:   03/11/19 1611  BP: (!) 142/70  Pulse: 78  Resp: 18  Temp: 98 F (36.7 C)  TempSrc: Oral  Weight: 182 lb 15.7 oz (83 kg)  Height: 5\' 1"  (1.549 m)   Body mass index is 34.57 kg/m. Physical Exam In general this is a pleasant elderly female sitting comfortably in her chair.  Her skin is warm and dry.  Does have history of a blister to her right heel which is currently covered  Eyes visual acuity appears to be intact sclera and conjunctive are clear.  Oropharynx is clear mucous membranes moist.  Chest is clear to auscultation there is no labored breathing.  Heart is regular rate and rhythm with occasional irregular beat-she has mild lower extremity edema/obese changes lower extremities pedal pulses are intact.  Abdomen is soft nontender with positive bowel  sounds.  Musculoskeletal appears able to move all her extremities x4 at baseline.  Neurologic appears to be grossly intact without lateralizing findings her speech is clear.  Psych she is pleasant and appropriate  Labs reviewed: Recent Labs    03/05/19 1445 03/05/19 1453 03/07/19 0421 03/07/19 0421 03/08/19 0433 03/09/19 0751 03/10/19 0656  NA 139   < > 142   < > 144 141 142  K 4.4   < > 3.1*   < > 3.5 3.4* 3.8  CL 106   < > 113*   < > 115* 110 109  CO2 20*   < > 19*   < > 22 23 22   GLUCOSE 150*   < > 115*   < > 112* 129* 128*  BUN 30*   < > 27*   < > 23 13 10   CREATININE 0.82   < > 0.70   < > 0.63 0.61 0.61  CALCIUM 10.1   < > 9.0   < > 8.7* 8.8* 9.2  MG 2.0  --  1.7  --   --   --  1.7  PHOS 3.0  --   --   --   --   --   --    < > = values in this interval not displayed.   Recent Labs    03/05/19 1445 03/06/19 0440 03/07/19 0421  AST 127* 111* 73*  ALT 46* 48* 48*  ALKPHOS 91 66 68  BILITOT 1.2 1.0 1.0  PROT 6.3* 4.9* 5.0*  ALBUMIN 3.9 2.9* 2.7*   Recent Labs    11/30/18 1232 12/01/18 0633 03/05/19 1445 03/05/19 1453 03/07/19 0421 03/08/19 0433 03/09/19 0751  WBC 12.7*   < > 21.1*   < > 11.4* 8.2 6.8  NEUTROABS 10.5*  --  18.2*  --   --  5.4  --   HGB 13.6   < > 13.8   < > 11.5* 11.2* 11.9*  HCT 41.9   < > 42.8   < > 34.9* 34.6* 36.9  MCV 92.9   < > 92.0   < > 91.4 92.3 92.5  PLT 174   < > 193   < > 128* 150 168   < > = values in this interval not  displayed.   Lab Results  Component Value Date   TSH 1.15 12/12/2016   Lab Results  Component Value Date   HGBA1C 5.8 (H) 03/06/2019   Lab Results  Component Value Date   CHOL 181 12/12/2016   HDL 59.40 12/12/2016   LDLCALC 104 (H) 12/12/2016   LDLDIRECT 133.5 01/28/2007   TRIG 91.0 12/12/2016   CHOLHDL 3 12/12/2016    Significant Diagnostic Results in last 30 days:  DG Knee 2 Views Right  Result Date: 03/05/2019 CLINICAL DATA:  Status post fall. EXAM: RIGHT KNEE - 1-2 VIEW COMPARISON:  None.  FINDINGS: There is a right knee replacement without evidence of surrounding lucency to suggest the presence of hardware loosening or infection. No evidence of fracture, dislocation, or joint effusion. Soft tissues are unremarkable. IMPRESSION: Right knee replacement without evidence of hardware loosening or infection. Electronically Signed   By: Virgina Norfolk M.D.   On: 03/05/2019 15:35   CT Head Wo Contrast  Result Date: 03/05/2019 CLINICAL DATA:  Fall occurring yesterday. Patient found lying on the floor. Headache. EXAM: CT HEAD WITHOUT CONTRAST CT CERVICAL SPINE WITHOUT CONTRAST TECHNIQUE: Multidetector CT imaging of the head and cervical spine was performed following the standard protocol without intravenous contrast. Multiplanar CT image reconstructions of the cervical spine were also generated. COMPARISON:  11/30/2018 FINDINGS: CT HEAD FINDINGS Brain: No evidence of acute infarction, hemorrhage, hydrocephalus, extra-axial collection or mass lesion/mass effect. There are changes from a previous occipital craniotomy. Metal vascular clips and possible coil material as well as calcifications are noted along the foramen magnum, stable compared to the prior head CT. Mild periventricular white matter hypoattenuation is noted consistent with chronic microvascular ischemic change. Vascular: Fairly prominent carotid calcifications. No hyperattenuating artery. Skull: No acute fracture or bone lesion. Sinuses/Orbits: Globes and orbits are unremarkable. Sinuses and mastoid air cells are clear. Other: None. CT CERVICAL SPINE FINDINGS Alignment: Normal. Skull base and vertebrae: No acute fracture. No primary bone lesion or focal pathologic process. Stable surgical changes from a previous suboccipital craniotomy. Soft tissues and spinal canal: No prevertebral fluid or swelling. No visible canal hematoma. Disc levels: Spondylotic disc bulging throughout the cervical spine. Moderate loss of disc height at C4-C5 and  C5-C6 with moderate to severe loss of disc height at C6-C7. No convincing disc herniation. Upper chest: No acute findings.  Scarring in the lung apices. Other: None. IMPRESSION: HEAD CT 1. No acute intracranial abnormalities. 2. No skull fracture. CERVICAL CT 1. No fracture or acute finding. Electronically Signed   By: Lajean Manes M.D.   On: 03/05/2019 17:22   CT Chest W Contrast  Result Date: 03/05/2019 CLINICAL DATA:  86 year old female with trauma. EXAM: CT CHEST, ABDOMEN, AND PELVIS WITH CONTRAST TECHNIQUE: Multidetector CT imaging of the chest, abdomen and pelvis was performed following the standard protocol during bolus administration of intravenous contrast. CONTRAST:  144mL OMNIPAQUE IOHEXOL 300 MG/ML  SOLN COMPARISON:  Chest radiograph dated 11/30/2018. FINDINGS: Evaluation of this exam is limited due to respiratory motion artifact. CT CHEST FINDINGS Cardiovascular: There is no cardiomegaly or pericardial effusion. Three-vessel coronary vascular calcification. There is calcification of the mitral annulus. There is moderate atherosclerotic calcification of the thoracic aorta. No aneurysmal dilatation or dissection. The origins of the great vessels of the aortic arch appear patent as visualized. A the central pulmonary arteries are grossly unremarkable for the degree of opacification. Mediastinum/Nodes: There is no hilar or mediastinal adenopathy. The esophagus is grossly unremarkable. No mediastinal fluid collection. There is an  8 mm right thyroid hypodense nodule. Lungs/Pleura: Bilateral lower lobe linear atelectasis/scarring noted. Confluent areas of hazy density throughout the lungs likely represents areas of atelectatic changes or air trapping related to underlying small vessel versus small airway disease. No lobar consolidation, pleural effusion, or pneumothorax. The central airways are patent. Musculoskeletal: No acute osseous pathology. Osteopenia with degenerative changes of the spine. A focus  of sclerosis involving the left posterior seventh rib at the costovertebral junction likely a bone island. CT ABDOMEN PELVIS FINDINGS No intra-abdominal free air or free fluid. Hepatobiliary: The liver is unremarkable. No intrahepatic biliary ductal dilatation. Cholecystectomy. Mildly dilated common bile duct, likely post cholecystectomy. No retained calcified stone noted in the central CBD. Pancreas: Unremarkable. No pancreatic ductal dilatation or surrounding inflammatory changes. Spleen: Normal in size without focal abnormality. Adrenals/Urinary Tract: The adrenal glands are unremarkable. There is no hydronephrosis on either side. There is symmetric enhancement and excretion of contrast by both kidneys. The visualized ureters and urinary bladder appear unremarkable. Stomach/Bowel: There is sigmoid diverticulosis without active inflammatory changes. There is no bowel obstruction or active inflammation. The appendix is not visualized with certainty. No inflammatory changes identified in the right lower quadrant. Vascular/Lymphatic: Moderate aortoiliac atherosclerotic disease. The IVC is grossly unremarkable. No portal venous gas. There is no adenopathy. Reproductive: The uterus is anteverted. There is mild thickened appearance of the endometrium measuring approximately 7 mm in thickness. Dedicated pelvic ultrasound is recommended for better evaluation on a nonemergent/outpatient basis. No adnexal masses identified. Other: There is a 3 cm umbilical hernia with protrusion of anterior wall of a segment of colon. No obstruction Musculoskeletal: There is osteopenia with degenerative changes of the spine. Grade 1 L5-S1 retrolisthesis. No acute osseous pathology. IMPRESSION: 1. No acute/traumatic intrathoracic, abdominal, or pelvic pathology. 2. Thickened appearance of the endometrium. Further evaluation with pelvic ultrasound on a nonemergent/outpatient basis recommended. 3.  Aortic Atherosclerosis (ICD10-I70.0).  Electronically Signed   By: Anner Crete M.D.   On: 03/05/2019 17:35   CT Cervical Spine Wo Contrast  Result Date: 03/05/2019 CLINICAL DATA:  Fall occurring yesterday. Patient found lying on the floor. Headache. EXAM: CT HEAD WITHOUT CONTRAST CT CERVICAL SPINE WITHOUT CONTRAST TECHNIQUE: Multidetector CT imaging of the head and cervical spine was performed following the standard protocol without intravenous contrast. Multiplanar CT image reconstructions of the cervical spine were also generated. COMPARISON:  11/30/2018 FINDINGS: CT HEAD FINDINGS Brain: No evidence of acute infarction, hemorrhage, hydrocephalus, extra-axial collection or mass lesion/mass effect. There are changes from a previous occipital craniotomy. Metal vascular clips and possible coil material as well as calcifications are noted along the foramen magnum, stable compared to the prior head CT. Mild periventricular white matter hypoattenuation is noted consistent with chronic microvascular ischemic change. Vascular: Fairly prominent carotid calcifications. No hyperattenuating artery. Skull: No acute fracture or bone lesion. Sinuses/Orbits: Globes and orbits are unremarkable. Sinuses and mastoid air cells are clear. Other: None. CT CERVICAL SPINE FINDINGS Alignment: Normal. Skull base and vertebrae: No acute fracture. No primary bone lesion or focal pathologic process. Stable surgical changes from a previous suboccipital craniotomy. Soft tissues and spinal canal: No prevertebral fluid or swelling. No visible canal hematoma. Disc levels: Spondylotic disc bulging throughout the cervical spine. Moderate loss of disc height at C4-C5 and C5-C6 with moderate to severe loss of disc height at C6-C7. No convincing disc herniation. Upper chest: No acute findings.  Scarring in the lung apices. Other: None. IMPRESSION: HEAD CT 1. No acute intracranial abnormalities. 2. No skull fracture.  CERVICAL CT 1. No fracture or acute finding. Electronically Signed    By: Lajean Manes M.D.   On: 03/05/2019 17:22   CT Abdomen Pelvis W Contrast  Result Date: 03/05/2019 CLINICAL DATA:  86 year old female with trauma. EXAM: CT CHEST, ABDOMEN, AND PELVIS WITH CONTRAST TECHNIQUE: Multidetector CT imaging of the chest, abdomen and pelvis was performed following the standard protocol during bolus administration of intravenous contrast. CONTRAST:  16mL OMNIPAQUE IOHEXOL 300 MG/ML  SOLN COMPARISON:  Chest radiograph dated 11/30/2018. FINDINGS: Evaluation of this exam is limited due to respiratory motion artifact. CT CHEST FINDINGS Cardiovascular: There is no cardiomegaly or pericardial effusion. Three-vessel coronary vascular calcification. There is calcification of the mitral annulus. There is moderate atherosclerotic calcification of the thoracic aorta. No aneurysmal dilatation or dissection. The origins of the great vessels of the aortic arch appear patent as visualized. A the central pulmonary arteries are grossly unremarkable for the degree of opacification. Mediastinum/Nodes: There is no hilar or mediastinal adenopathy. The esophagus is grossly unremarkable. No mediastinal fluid collection. There is an 8 mm right thyroid hypodense nodule. Lungs/Pleura: Bilateral lower lobe linear atelectasis/scarring noted. Confluent areas of hazy density throughout the lungs likely represents areas of atelectatic changes or air trapping related to underlying small vessel versus small airway disease. No lobar consolidation, pleural effusion, or pneumothorax. The central airways are patent. Musculoskeletal: No acute osseous pathology. Osteopenia with degenerative changes of the spine. A focus of sclerosis involving the left posterior seventh rib at the costovertebral junction likely a bone island. CT ABDOMEN PELVIS FINDINGS No intra-abdominal free air or free fluid. Hepatobiliary: The liver is unremarkable. No intrahepatic biliary ductal dilatation. Cholecystectomy. Mildly dilated common bile  duct, likely post cholecystectomy. No retained calcified stone noted in the central CBD. Pancreas: Unremarkable. No pancreatic ductal dilatation or surrounding inflammatory changes. Spleen: Normal in size without focal abnormality. Adrenals/Urinary Tract: The adrenal glands are unremarkable. There is no hydronephrosis on either side. There is symmetric enhancement and excretion of contrast by both kidneys. The visualized ureters and urinary bladder appear unremarkable. Stomach/Bowel: There is sigmoid diverticulosis without active inflammatory changes. There is no bowel obstruction or active inflammation. The appendix is not visualized with certainty. No inflammatory changes identified in the right lower quadrant. Vascular/Lymphatic: Moderate aortoiliac atherosclerotic disease. The IVC is grossly unremarkable. No portal venous gas. There is no adenopathy. Reproductive: The uterus is anteverted. There is mild thickened appearance of the endometrium measuring approximately 7 mm in thickness. Dedicated pelvic ultrasound is recommended for better evaluation on a nonemergent/outpatient basis. No adnexal masses identified. Other: There is a 3 cm umbilical hernia with protrusion of anterior wall of a segment of colon. No obstruction Musculoskeletal: There is osteopenia with degenerative changes of the spine. Grade 1 L5-S1 retrolisthesis. No acute osseous pathology. IMPRESSION: 1. No acute/traumatic intrathoracic, abdominal, or pelvic pathology. 2. Thickened appearance of the endometrium. Further evaluation with pelvic ultrasound on a nonemergent/outpatient basis recommended. 3.  Aortic Atherosclerosis (ICD10-I70.0). Electronically Signed   By: Anner Crete M.D.   On: 03/05/2019 17:35   DG Hip Unilat With Pelvis 2-3 Views Right  Result Date: 03/05/2019 CLINICAL DATA:  Status post fall yesterday. EXAM: DG HIP (WITH OR WITHOUT PELVIS) 2-3V RIGHT COMPARISON:  None. FINDINGS: Generalized osteopenia. No fracture or  dislocation. No aggressive osseous lesion. Mild osteoarthritis bilateral SI joints. IMPRESSION: No acute osseous injury of the right hip. Given the patient's age and osteopenia, if there is persistent clinical concern for an occult hip fracture, a MRI of the  hip is recommended for increased sensitivity. Electronically Signed   By: Kathreen Devoid   On: 03/05/2019 15:34    Assessment/Plan  #1 history of UTI again she was treated with antibiotics in the hospital she is completed a course of Macrobid does not complain of dysuria white count appears to have normalized.  2.  History of rhabdomylolysis-this responded to IV fluids and CK has come down to 204 from over 1600.  3.  Hypertension at this point will monitor recent systolics in the 0000000 range would like to get more readings-she is on Norvasc 10 mg a day hydralazine 25 mg every 8 hours as well as lisinopril 20 mg a day-.  4.  History of type 2 diabetes thought to be diet-controlled will order CBGs Monday Wednesday Friday CBGs in the hospital appear to be quite stable largely in the lower mid 100s.  5.  History of restless legs?  She is on Requip 0.25 mg nightly.  Will update a CBC and metabolic panel for follow-up labs on Monday, February 01-.  [id number] note greater than 35 minutes spent assessing patient reviewing her hospital records labs-discussing her status with nursing staff-coordinating and formulating plan of care-of note greater than 50% of time spent coordinating plan of care with input as noted above

## 2019-03-13 ENCOUNTER — Encounter: Payer: Self-pay | Admitting: Internal Medicine

## 2019-03-13 ENCOUNTER — Non-Acute Institutional Stay (SKILLED_NURSING_FACILITY): Payer: Medicare Other | Admitting: Internal Medicine

## 2019-03-13 DIAGNOSIS — T796XXD Traumatic ischemia of muscle, subsequent encounter: Secondary | ICD-10-CM

## 2019-03-13 DIAGNOSIS — A419 Sepsis, unspecified organism: Secondary | ICD-10-CM | POA: Diagnosis not present

## 2019-03-13 DIAGNOSIS — I10 Essential (primary) hypertension: Secondary | ICD-10-CM | POA: Diagnosis not present

## 2019-03-13 DIAGNOSIS — G2581 Restless legs syndrome: Secondary | ICD-10-CM

## 2019-03-13 DIAGNOSIS — N390 Urinary tract infection, site not specified: Secondary | ICD-10-CM

## 2019-03-13 DIAGNOSIS — E119 Type 2 diabetes mellitus without complications: Secondary | ICD-10-CM | POA: Diagnosis not present

## 2019-03-13 NOTE — Progress Notes (Signed)
: Provider:  Noah Delaine. Sheppard Coil, MD Location:  [facility] Room Number: [phone] Place of Service:  SNF ([id number])  PCP: Hennie Duos, MD Patient Care Team: Hennie Duos, MD as PCP - General (Internal Medicine) Garald Balding, MD (Orthopedic Surgery)  Extended Emergency Contact Information Primary Emergency Contact: Rhew,Tony Home Phone: [phone] Mobile Phone: [phone] Relation: Son Secondary Emergency Contact: [place] Phone: [phone] Mobile Phone: [phone] Relation: Nephew     Allergies: Gabapentin, Lyrica [pregabalin], Other, Penicillins, and Vit d-vit e-safflower oil  Chief Complaint  Patient presents with  . New Admit To SNF    Admit to Facility     HPI: Patient is 86 y.o. female with diabetes mellitus type 2, hypertension, history of DVT, who presented to [facility] for evaluation after a fall at home with prolonged downtime.  Patient said she fell sometime in the evening and was found by her niece on the ground the next morning.  In the ED vital signs were stable, CMP was unremarkable, WBC was 21.1 and urinalysis was positive for nitrites trace leukocyte Estrace.  X-rays of the hip and knee she did a head CT cervical spine CT chest abdomen were all negative patient was admitted to [facility] from 1/21-26 where she was treated for sepsis due to UTI with rhabdomyolysis.  Patient was treated with Rocephin, urine grew out staph : Provider:   Location:  [place] Room Number: [id number] Place of Service:  SNF ([id number])  PCP: Hennie Duos, MD Patient Care Team: Hennie Duos, MD as PCP - General (Internal Medicine) Garald Balding, MD (Orthopedic Surgery)  Extended Emergency Contact Information Primary Emergency Contact: Rhew,Tony Home Phone: [phone] Mobile Phone: [phone] Relation: Son Secondary Emergency Contact: [place] Phone:  [phone] Mobile Phone: [phone] Relation: Nephew     Allergies: Gabapentin, Lyrica [pregabalin], Other, Penicillins, and Vit d-vit e-safflower oil  Chief Complaint  Patient presents with  . New Admit To SNF    Admit to Facility     HPI: Patient is 86 y.o. female with diabetes mellitus type 2, hypertension, history of DVT, who presented to ED for evaluation for fall at home in the evening and being found by her niece the next morning.  In the ED patient's vital signs were stable chemistry was unremarkable but WBC was 21 and urine was suspicious for infection.  Patient had a x-ray of the right hip, right knee, and CT of the head C-spine and chest and abdomen which were all negative.  Patient was admitted to [facility] from 1/21-26 for sepsis secondary to UTI and rhabdomyolysis.  Patient was treated with IV Rocephin, urine grew out staph epi.  Treatment was completed with Macrobid.  Rhabdomyolysis improved with IV fluids.  Patient is admitted to skilled nursing facility for OT/PT.  While at skilled nursing facility patient will be followed for hypertension treated with Norvasc hydralazine and lisinopril and restless leg syndrome treated with Requip.  Past Medical History:  Diagnosis Date  . Arthritis   . CNS aneurysm 1983   s/p clipping  . Diabetes mellitus type II    diet controlled   . DVT (deep venous thrombosis) ([place]) 10/15  . Elevated glucose   . Hypertension   . Insomnia   . Knee pain    Left Dr. Meyer Cory  . LBP (low back pain)   . PN (peripheral neuropathy)    unknown etiol  Past Surgical History:  Procedure Laterality Date  . CEREBRAL ANEURYSM REPAIR     initial repair in the 1980s, with second surgery in the 1990s  . CHOLECYSTECTOMY    . JOINT REPLACEMENT    . KNEE ARTHROPLASTY  04/2009   Left  . TOTAL KNEE ARTHROPLASTY    . TUBAL LIGATION      Allergies as of 03/13/2019      Reactions   Gabapentin Other (See Comments)   Made the legs "go numb"    Lyrica [pregabalin] Other (See Comments)   "Made me pass out"   Other Other (See Comments)   "MD mixed a blood pressure medication with the Clinoril I was taking and I couldn't walk"   Penicillins Other (See Comments)   Has patient had a PCN reaction causing immediate rash, facial/tongue/throat swelling, SOB or lightheadedness with hypotension: Unk- exact reaction not recalled, but is allergic Has patient had a PCN reaction causing severe rash involving mucus membranes or skin necrosis: Unk Has patient had a PCN reaction that required hospitalization: Unk Has patient had a PCN reaction occurring within the last 10 years: Yes If all of the above answers are "NO", then may proceed with Cephalosporin use.   Vit D-vit E-safflower Oil Other (See Comments)   "Made me achy and sick"      Medication List       Accurate as of March 13, 2019 11:59 PM. If you have any questions, ask your nurse or doctor.        amLODipine 10 MG tablet Commonly known as: NORVASC Take 1 tablet (10 mg total) by mouth daily.   feeding supplement (PRO-STAT SUGAR FREE 64) Liqd Take 30 mLs by mouth 2 (two) times daily. Wound healing   hydrALAZINE 25 MG tablet Commonly known as: APRESOLINE Take 1 tablet (25 mg total) by mouth every 8 (eight) hours.   lisinopril 20 MG tablet Commonly known as: ZESTRIL Take 1 tablet (20 mg total) by mouth daily.   NON FORMULARY DIET: REGULAR, NAS, CCD, HEART HEALTHY DIET   rOPINIRole 0.25 MG tablet Commonly known as: REQUIP Take 0.25 mg by mouth at bedtime.       No orders of the defined types were placed in this encounter.   Immunization History  Administered Date(s) Administered  . Pneumococcal Conjugate-13 12/12/2016  . Pneumococcal Polysaccharide-23 11/16/2008  . Tdap 12/14/2014, 07/13/2016    Social History   Tobacco Use  . Smoking status: Former Research scientist (life sciences)  . Smokeless tobacco: Never Used  . Tobacco comment: quit in 1983  Substance Use Topics  .  Alcohol use: No    Alcohol/week: 0.0 standard drinks    Family history is   Family History  Problem Relation Age of Onset  . Arthritis Mother   . Stroke Mother   . Stroke Father   . Arthritis-Osteo Son       Review of Systems  GENERAL:  no fevers, fatigue, appetite changes SKIN: No itching, or rash EYES: No eye pain, redness, discharge EARS: No earache, tinnitus, change in hearing NOSE: No congestion, drainage or bleeding  MOUTH/THROAT: No mouth or tooth pain, No sore throat RESPIRATORY: No cough, wheezing, SOB CARDIAC: No chest pain, palpitations, lower extremity edema  GI: No abdominal pain, No N/V/D or constipation, No heartburn or reflux  GU: No dysuria, frequency or urgency, or incontinence  MUSCULOSKELETAL: Cramps in right leg NEUROLOGIC: No headache, dizziness or focal weakness PSYCHIATRIC: No c/o anxiety or sadness   Vitals:   03/13/19  0932  BP: 136/67  Pulse: 89  Resp: 18  Temp: (!) 97.5 F (36.4 C)    SpO2 Readings from Last 1 Encounters:  03/10/19 100%   Body mass index is 29.66 kg/m.     Physical Exam  GENERAL APPEARANCE: Alert, conversant,  No acute distress.  SKIN: No diaphoresis rash HEAD: Normocephalic, atraumatic  EYES: Conjunctiva/lids clear. Pupils round, reactive. EOMs intact.  EARS: External exam WNL, canals clear. Hearing grossly normal.  NOSE: No deformity or discharge.  MOUTH/THROAT: Lips w/o lesions  RESPIRATORY: Breathing is even, unlabored. Lung sounds are clear   CARDIOVASCULAR: Heart irregular, no murmurs, rubs or gallops. No peripheral edema.   GASTROINTESTINAL: Abdomen is soft, non-tender, not distended w/ normal bowel sounds. GENITOURINARY: Bladder non tender, not distended  MUSCULOSKELETAL: No abnormal joints or musculature NEUROLOGIC:  Cranial nerves 2-12 grossly intact. Moves all extremities  PSYCHIATRIC: Mood and affect appropriate to situation, no behavioral issues  Patient Active Problem List   Diagnosis Date  Noted  . Sepsis due to gram-negative UTI ([place]) 03/06/2019  . Sepsis due to urinary tract infection ([place]) 03/05/2019  . Fall at home 03/05/2019  . Open toe wound 12/01/2018  . Traumatic rhabdomyolysis ([place]) 11/30/2018  . Cellulitis 11/30/2018  . Transaminitis 11/30/2018  . Abnormal urinalysis 11/30/2018  . Leukocytosis 11/30/2018  . Hypokalemia 11/30/2018  . Scalp laceration   . Skull fracture ([place]) 07/13/2016  . Chronic left shoulder pain 04/03/2016  . Diabetes mellitus without complication ([place]) 0000000  . Hearing loss 06/21/2015  . Advance care planning 12/17/2014  . Palpitations 07/06/2014  . Pain in joint, ankle and foot 04/20/2014  . DVT, lower extremity ([place]) 11/19/2013  . Edema 10/20/2013  . Headache(784.0) 10/20/2013  . Left leg swelling 10/20/2013  . Aneurysm of basilar artery ([facility]) 10/20/2013  . Bradycardia 10/20/2013  . Vitamin B12 deficiency 07/16/2013  . Paresthesia of both feet 05/17/2013  . TMJ arthritis 09/11/2012  . Ear pain 09/11/2012  . History of transfusion of packed red blood cells 09/11/2012  . Spinal stenosis 04/13/2011  . Well adult exam 01/23/2011  . Cholecystitis with cholelithiasis, s/p lap chole 11/10/2010 12/01/2010  . Shoulder pain, right 07/25/2010  . TOBACCO USE, QUIT 03/09/2009  . KNEE PAIN 05/18/2008  . PN (peripheral neuropathy) 08/19/2007  . LEG PAIN 08/19/2007  . Vitamin D deficiency 02/17/2007  . INSOMNIA, PERSISTENT 02/17/2007  . Essential hypertension 02/17/2007  . GERD 02/17/2007  . OSTEOARTHRITIS 02/17/2007  . LOW BACK PAIN 02/17/2007  . Hyperglycemia 01/28/2007  . UNSPECIFIED OSTEOPOROSIS 01/28/2007  . PRECIPITOUS DROP IN HEMATOCRIT 01/28/2007      Labs reviewed: Basic Metabolic Panel:    Component Value Date/Time   NA 142 03/10/2019 0656   K 3.8 03/10/2019 0656   CL 109 03/10/2019 0656   CO2 22 03/10/2019 0656   GLUCOSE 128 (H) 03/10/2019 0656   GLUCOSE 129 (H) 02/06/2006 1105   BUN 10 03/10/2019 0656    CREATININE 0.61 03/10/2019 0656   CALCIUM 9.2 03/10/2019 0656   PROT 5.0 (L) 03/07/2019 0421   ALBUMIN 2.7 (L) 03/07/2019 0421   AST 73 (H) 03/07/2019 0421   ALT 48 (H) 03/07/2019 0421   ALKPHOS 68 03/07/2019 0421   BILITOT 1.0 03/07/2019 0421   GFRNONAA >60 03/10/2019 0656   GFRAA >60 03/10/2019 0656    Recent Labs    03/05/19 1445 03/05/19 1453 03/07/19 0421 03/07/19 0421 03/08/19 0433 03/09/19 0751 03/10/19 0656  NA 139   < > 142   < >  144 141 142  K 4.4   < > 3.1*   < > 3.5 3.4* 3.8  CL 106   < > 113*   < > 115* 110 109  CO2 20*   < > 19*   < > 22 23 22   GLUCOSE 150*   < > 115*   < > 112* 129* 128*  BUN 30*   < > 27*   < > 23 13 10   CREATININE 0.82   < > 0.70   < > 0.63 0.61 0.61  CALCIUM 10.1   < > 9.0   < > 8.7* 8.8* 9.2  MG 2.0  --  1.7  --   --   --  1.7  PHOS 3.0  --   --   --   --   --   --    < > = values in this interval not displayed.   Liver Function Tests: Recent Labs    03/05/19 1445 03/06/19 0440 03/07/19 0421  AST 127* 111* 73*  ALT 46* 48* 48*  ALKPHOS 91 66 68  BILITOT 1.2 1.0 1.0  PROT 6.3* 4.9* 5.0*  ALBUMIN 3.9 2.9* 2.7*   Recent Labs    03/05/19 1445  LIPASE 14   No results for input(s): AMMONIA in the last 8760 hours. CBC: Recent Labs    11/30/18 1232 12/01/18 0633 03/05/19 1445 03/05/19 1453 03/07/19 0421 03/08/19 0433 03/09/19 0751  WBC 12.7*   < > 21.1*   < > 11.4* 8.2 6.8  NEUTROABS 10.5*  --  18.2*  --   --  5.4  --   HGB 13.6   < > 13.8   < > 11.5* 11.2* 11.9*  HCT 41.9   < > 42.8   < > 34.9* 34.6* 36.9  MCV 92.9   < > 92.0   < > 91.4 92.3 92.5  PLT 174   < > 193   < > 128* 150 168   < > = values in this interval not displayed.   Lipid No results for input(s): CHOL, HDL, LDLCALC, TRIG in the last 8760 hours.  Cardiac Enzymes: Recent Labs    03/05/19 1445 03/06/19 0440 03/09/19 0751  CKTOTAL 3,107* 1,655* 204   BNP: No results for input(s): BNP in the last 8760 hours. Lab Results  Component Value Date    MICROALBUR 3.3 (H) 05/01/2017   Lab Results  Component Value Date   HGBA1C 5.8 (H) 03/06/2019   Lab Results  Component Value Date   TSH 1.15 12/12/2016   Lab Results  Component Value Date   VITAMINB12 >1500 (H) 10/13/2013   No results found for: FOLATE No results found for: IRON, TIBC, FERRITIN  Imaging and Procedures obtained prior to SNF admission: DG Knee 2 Views Right  Result Date: 03/05/2019 CLINICAL DATA:  Status post fall. EXAM: RIGHT KNEE - 1-2 VIEW COMPARISON:  None. FINDINGS: There is a right knee replacement without evidence of surrounding lucency to suggest the presence of hardware loosening or infection. No evidence of fracture, dislocation, or joint effusion. Soft tissues are unremarkable. IMPRESSION: Right knee replacement without evidence of hardware loosening or infection. Electronically Signed   By: Virgina Norfolk M.D.   On: 03/05/2019 15:35   CT Head Wo Contrast  Result Date: 03/05/2019 CLINICAL DATA:  Fall occurring yesterday. Patient found lying on the floor. Headache. EXAM: CT HEAD WITHOUT CONTRAST CT CERVICAL SPINE WITHOUT CONTRAST TECHNIQUE: Multidetector CT imaging of the head and cervical spine  was performed following the standard protocol without intravenous contrast. Multiplanar CT image reconstructions of the cervical spine were also generated. COMPARISON:  11/30/2018 FINDINGS: CT HEAD FINDINGS Brain: No evidence of acute infarction, hemorrhage, hydrocephalus, extra-axial collection or mass lesion/mass effect. There are changes from a previous occipital craniotomy. Metal vascular clips and possible coil material as well as calcifications are noted along the foramen magnum, stable compared to the prior head CT. Mild periventricular white matter hypoattenuation is noted consistent with chronic microvascular ischemic change. Vascular: Fairly prominent carotid calcifications. No hyperattenuating artery. Skull: No acute fracture or bone lesion. Sinuses/Orbits:  Globes and orbits are unremarkable. Sinuses and mastoid air cells are clear. Other: None. CT CERVICAL SPINE FINDINGS Alignment: Normal. Skull base and vertebrae: No acute fracture. No primary bone lesion or focal pathologic process. Stable surgical changes from a previous suboccipital craniotomy. Soft tissues and spinal canal: No prevertebral fluid or swelling. No visible canal hematoma. Disc levels: Spondylotic disc bulging throughout the cervical spine. Moderate loss of disc height at C4-C5 and C5-C6 with moderate to severe loss of disc height at C6-C7. No convincing disc herniation. Upper chest: No acute findings.  Scarring in the lung apices. Other: None. IMPRESSION: HEAD CT 1. No acute intracranial abnormalities. 2. No skull fracture. CERVICAL CT 1. No fracture or acute finding. Electronically Signed   By: Lajean Manes M.D.   On: 03/05/2019 17:22   CT Chest W Contrast  Result Date: 03/05/2019 CLINICAL DATA:  86 year old female with trauma. EXAM: CT CHEST, ABDOMEN, AND PELVIS WITH CONTRAST TECHNIQUE: Multidetector CT imaging of the chest, abdomen and pelvis was performed following the standard protocol during bolus administration of intravenous contrast. CONTRAST:  180mL OMNIPAQUE IOHEXOL 300 MG/ML  SOLN COMPARISON:  Chest radiograph dated 11/30/2018. FINDINGS: Evaluation of this exam is limited due to respiratory motion artifact. CT CHEST FINDINGS Cardiovascular: There is no cardiomegaly or pericardial effusion. Three-vessel coronary vascular calcification. There is calcification of the mitral annulus. There is moderate atherosclerotic calcification of the thoracic aorta. No aneurysmal dilatation or dissection. The origins of the great vessels of the aortic arch appear patent as visualized. A the central pulmonary arteries are grossly unremarkable for the degree of opacification. Mediastinum/Nodes: There is no hilar or mediastinal adenopathy. The esophagus is grossly unremarkable. No mediastinal fluid  collection. There is an 8 mm right thyroid hypodense nodule. Lungs/Pleura: Bilateral lower lobe linear atelectasis/scarring noted. Confluent areas of hazy density throughout the lungs likely represents areas of atelectatic changes or air trapping related to underlying small vessel versus small airway disease. No lobar consolidation, pleural effusion, or pneumothorax. The central airways are patent. Musculoskeletal: No acute osseous pathology. Osteopenia with degenerative changes of the spine. A focus of sclerosis involving the left posterior seventh rib at the costovertebral junction likely a bone island. CT ABDOMEN PELVIS FINDINGS No intra-abdominal free air or free fluid. Hepatobiliary: The liver is unremarkable. No intrahepatic biliary ductal dilatation. Cholecystectomy. Mildly dilated common bile duct, likely post cholecystectomy. No retained calcified stone noted in the central CBD. Pancreas: Unremarkable. No pancreatic ductal dilatation or surrounding inflammatory changes. Spleen: Normal in size without focal abnormality. Adrenals/Urinary Tract: The adrenal glands are unremarkable. There is no hydronephrosis on either side. There is symmetric enhancement and excretion of contrast by both kidneys. The visualized ureters and urinary bladder appear unremarkable. Stomach/Bowel: There is sigmoid diverticulosis without active inflammatory changes. There is no bowel obstruction or active inflammation. The appendix is not visualized with certainty. No inflammatory changes identified in the right lower quadrant.  Vascular/Lymphatic: Moderate aortoiliac atherosclerotic disease. The IVC is grossly unremarkable. No portal venous gas. There is no adenopathy. Reproductive: The uterus is anteverted. There is mild thickened appearance of the endometrium measuring approximately 7 mm in thickness. Dedicated pelvic ultrasound is recommended for better evaluation on a nonemergent/outpatient basis. No adnexal masses identified.  Other: There is a 3 cm umbilical hernia with protrusion of anterior wall of a segment of colon. No obstruction Musculoskeletal: There is osteopenia with degenerative changes of the spine. Grade 1 L5-S1 retrolisthesis. No acute osseous pathology. IMPRESSION: 1. No acute/traumatic intrathoracic, abdominal, or pelvic pathology. 2. Thickened appearance of the endometrium. Further evaluation with pelvic ultrasound on a nonemergent/outpatient basis recommended. 3.  Aortic Atherosclerosis (ICD10-I70.0). Electronically Signed   By: Anner Crete M.D.   On: 03/05/2019 17:35   CT Cervical Spine Wo Contrast  Result Date: 03/05/2019 CLINICAL DATA:  Fall occurring yesterday. Patient found lying on the floor. Headache. EXAM: CT HEAD WITHOUT CONTRAST CT CERVICAL SPINE WITHOUT CONTRAST TECHNIQUE: Multidetector CT imaging of the head and cervical spine was performed following the standard protocol without intravenous contrast. Multiplanar CT image reconstructions of the cervical spine were also generated. COMPARISON:  11/30/2018 FINDINGS: CT HEAD FINDINGS Brain: No evidence of acute infarction, hemorrhage, hydrocephalus, extra-axial collection or mass lesion/mass effect. There are changes from a previous occipital craniotomy. Metal vascular clips and possible coil material as well as calcifications are noted along the foramen magnum, stable compared to the prior head CT. Mild periventricular white matter hypoattenuation is noted consistent with chronic microvascular ischemic change. Vascular: Fairly prominent carotid calcifications. No hyperattenuating artery. Skull: No acute fracture or bone lesion. Sinuses/Orbits: Globes and orbits are unremarkable. Sinuses and mastoid air cells are clear. Other: None. CT CERVICAL SPINE FINDINGS Alignment: Normal. Skull base and vertebrae: No acute fracture. No primary bone lesion or focal pathologic process. Stable surgical changes from a previous suboccipital craniotomy. Soft tissues and  spinal canal: No prevertebral fluid or swelling. No visible canal hematoma. Disc levels: Spondylotic disc bulging throughout the cervical spine. Moderate loss of disc height at C4-C5 and C5-C6 with moderate to severe loss of disc height at C6-C7. No convincing disc herniation. Upper chest: No acute findings.  Scarring in the lung apices. Other: None. IMPRESSION: HEAD CT 1. No acute intracranial abnormalities. 2. No skull fracture. CERVICAL CT 1. No fracture or acute finding. Electronically Signed   By: Lajean Manes M.D.   On: 03/05/2019 17:22   CT Abdomen Pelvis W Contrast  Result Date: 03/05/2019 CLINICAL DATA:  86 year old female with trauma. EXAM: CT CHEST, ABDOMEN, AND PELVIS WITH CONTRAST TECHNIQUE: Multidetector CT imaging of the chest, abdomen and pelvis was performed following the standard protocol during bolus administration of intravenous contrast. CONTRAST:  132mL OMNIPAQUE IOHEXOL 300 MG/ML  SOLN COMPARISON:  Chest radiograph dated 11/30/2018. FINDINGS: Evaluation of this exam is limited due to respiratory motion artifact. CT CHEST FINDINGS Cardiovascular: There is no cardiomegaly or pericardial effusion. Three-vessel coronary vascular calcification. There is calcification of the mitral annulus. There is moderate atherosclerotic calcification of the thoracic aorta. No aneurysmal dilatation or dissection. The origins of the great vessels of the aortic arch appear patent as visualized. A the central pulmonary arteries are grossly unremarkable for the degree of opacification. Mediastinum/Nodes: There is no hilar or mediastinal adenopathy. The esophagus is grossly unremarkable. No mediastinal fluid collection. There is an 8 mm right thyroid hypodense nodule. Lungs/Pleura: Bilateral lower lobe linear atelectasis/scarring noted. Confluent areas of hazy density throughout the lungs likely represents  areas of atelectatic changes or air trapping related to underlying small vessel versus small airway disease.  No lobar consolidation, pleural effusion, or pneumothorax. The central airways are patent. Musculoskeletal: No acute osseous pathology. Osteopenia with degenerative changes of the spine. A focus of sclerosis involving the left posterior seventh rib at the costovertebral junction likely a bone island. CT ABDOMEN PELVIS FINDINGS No intra-abdominal free air or free fluid. Hepatobiliary: The liver is unremarkable. No intrahepatic biliary ductal dilatation. Cholecystectomy. Mildly dilated common bile duct, likely post cholecystectomy. No retained calcified stone noted in the central CBD. Pancreas: Unremarkable. No pancreatic ductal dilatation or surrounding inflammatory changes. Spleen: Normal in size without focal abnormality. Adrenals/Urinary Tract: The adrenal glands are unremarkable. There is no hydronephrosis on either side. There is symmetric enhancement and excretion of contrast by both kidneys. The visualized ureters and urinary bladder appear unremarkable. Stomach/Bowel: There is sigmoid diverticulosis without active inflammatory changes. There is no bowel obstruction or active inflammation. The appendix is not visualized with certainty. No inflammatory changes identified in the right lower quadrant. Vascular/Lymphatic: Moderate aortoiliac atherosclerotic disease. The IVC is grossly unremarkable. No portal venous gas. There is no adenopathy. Reproductive: The uterus is anteverted. There is mild thickened appearance of the endometrium measuring approximately 7 mm in thickness. Dedicated pelvic ultrasound is recommended for better evaluation on a nonemergent/outpatient basis. No adnexal masses identified. Other: There is a 3 cm umbilical hernia with protrusion of anterior wall of a segment of colon. No obstruction Musculoskeletal: There is osteopenia with degenerative changes of the spine. Grade 1 L5-S1 retrolisthesis. No acute osseous pathology. IMPRESSION: 1. No acute/traumatic intrathoracic, abdominal, or  pelvic pathology. 2. Thickened appearance of the endometrium. Further evaluation with pelvic ultrasound on a nonemergent/outpatient basis recommended. 3.  Aortic Atherosclerosis (ICD10-I70.0). Electronically Signed   By: Anner Crete M.D.   On: 03/05/2019 17:35   DG Hip Unilat With Pelvis 2-3 Views Right  Result Date: 03/05/2019 CLINICAL DATA:  Status post fall yesterday. EXAM: DG HIP (WITH OR WITHOUT PELVIS) 2-3V RIGHT COMPARISON:  None. FINDINGS: Generalized osteopenia. No fracture or dislocation. No aggressive osseous lesion. Mild osteoarthritis bilateral SI joints. IMPRESSION: No acute osseous injury of the right hip. Given the patient's age and osteopenia, if there is persistent clinical concern for an occult hip fracture, a MRI of the hip is recommended for increased sensitivity. Electronically Signed   By: Kathreen Devoid   On: 03/05/2019 15:34     Not all labs, radiology exams or other studies done during hospitalization come through on my EPIC note; however they are reviewed by me.    Assessment and Plan    No problem-specific Assessment & Plan notes found for this encounter.   Hennie Duos, MD   Past Medical History:  Diagnosis Date  . Arthritis   . CNS aneurysm 1983   s/p clipping  . Diabetes mellitus type II    diet controlled   . DVT (deep venous thrombosis) ([place]) 10/15  . Elevated glucose   . Hypertension   . Insomnia   . Knee pain    Left Dr. Meyer Cory  . LBP (low back pain)   . PN (peripheral neuropathy)    unknown etiol    Past Surgical History:  Procedure Laterality Date  . CEREBRAL ANEURYSM REPAIR     initial repair in the 1980s, with second surgery in the 1990s  . CHOLECYSTECTOMY    . JOINT REPLACEMENT    . KNEE ARTHROPLASTY  04/2009   Left  .  TOTAL KNEE ARTHROPLASTY    . TUBAL LIGATION      Allergies as of 03/13/2019      Reactions   Gabapentin Other (See Comments)   Made the legs "go numb"   Lyrica [pregabalin] Other (See Comments)    "Made me pass out"   Other Other (See Comments)   "MD mixed a blood pressure medication with the Clinoril I was taking and I couldn't walk"   Penicillins Other (See Comments)   Has patient had a PCN reaction causing immediate rash, facial/tongue/throat swelling, SOB or lightheadedness with hypotension: Unk- exact reaction not recalled, but is allergic Has patient had a PCN reaction causing severe rash involving mucus membranes or skin necrosis: Unk Has patient had a PCN reaction that required hospitalization: Unk Has patient had a PCN reaction occurring within the last 10 years: Yes If all of the above answers are "NO", then may proceed with Cephalosporin use.   Vit D-vit E-safflower Oil Other (See Comments)   "Made me achy and sick"      Medication List       Accurate as of March 13, 2019  9:40 AM. If you have any questions, ask your nurse or doctor.        amLODipine 10 MG tablet Commonly known as: NORVASC Take 1 tablet (10 mg total) by mouth daily.   feeding supplement (PRO-STAT SUGAR FREE 64) Liqd Take 30 mLs by mouth 2 (two) times daily. Wound healing   hydrALAZINE 25 MG tablet Commonly known as: APRESOLINE Take 1 tablet (25 mg total) by mouth every 8 (eight) hours.   lisinopril 20 MG tablet Commonly known as: ZESTRIL Take 1 tablet (20 mg total) by mouth daily.   NON FORMULARY DIET: REGULAR, NAS, CCD, HEART HEALTHY DIET   rOPINIRole 0.25 MG tablet Commonly known as: REQUIP Take 0.25 mg by mouth at bedtime.       No orders of the defined types were placed in this encounter.   Immunization History  Administered Date(s) Administered  . Pneumococcal Conjugate-13 12/12/2016  . Pneumococcal Polysaccharide-23 11/16/2008  . Tdap 12/14/2014, 07/13/2016    Social History   Tobacco Use  . Smoking status: Former Research scientist (life sciences)  . Smokeless tobacco: Never Used  . Tobacco comment: quit in 1983  Substance Use Topics  . Alcohol use: No    Alcohol/week: 0.0 standard  drinks    Family history is   Family History  Problem Relation Age of Onset  . Arthritis Mother   . Stroke Mother   . Stroke Father   . Arthritis-Osteo Son       Review of Systems  DATA OBTAINED: from patient, nurse, medical record, family member GENERAL:  no fevers, fatigue, appetite changes SKIN: No itching, or rash EYES: No eye pain, redness, discharge EARS: No earache, tinnitus, change in hearing NOSE: No congestion, drainage or bleeding  MOUTH/THROAT: No mouth or tooth pain, No sore throat RESPIRATORY: No cough, wheezing, SOB CARDIAC: No chest pain, palpitations, lower extremity edema  GI: No abdominal pain, No N/V/D or constipation, No heartburn or reflux  GU: No dysuria, frequency or urgency, or incontinence  MUSCULOSKELETAL: No unrelieved bone/joint pain NEUROLOGIC: No headache, dizziness or focal weakness PSYCHIATRIC: No c/o anxiety or sadness   Vitals:   03/13/19 0932  BP: 136/67  Pulse: 89  Resp: 18  Temp: (!) 97.5 F (36.4 C)    SpO2 Readings from Last 1 Encounters:  03/10/19 100%   Body mass index is 29.66 kg/m.  Physical Exam  GENERAL APPEARANCE: Alert, conversant,  No acute distress.  SKIN: No diaphoresis rash HEAD: Normocephalic, atraumatic  EYES: Conjunctiva/lids clear. Pupils round, reactive. EOMs intact.  EARS: External exam WNL, canals clear. Hearing grossly normal.  NOSE: No deformity or discharge.  MOUTH/THROAT: Lips w/o lesions  RESPIRATORY: Breathing is even, unlabored. Lung sounds are clear   CARDIOVASCULAR: Heart RRR no murmurs, rubs or gallops. No peripheral edema.   GASTROINTESTINAL: Abdomen is soft, non-tender, not distended w/ normal bowel sounds. GENITOURINARY: Bladder non tender, not distended  MUSCULOSKELETAL: No abnormal joints or musculature NEUROLOGIC:  Cranial nerves 2-12 grossly intact. Moves all extremities  PSYCHIATRIC: Mood and affect appropriate to situation, no behavioral issues  Patient Active Problem List    Diagnosis Date Noted  . Sepsis due to gram-negative UTI ([place]) 03/06/2019  . Sepsis due to urinary tract infection ([place]) 03/05/2019  . Fall at home 03/05/2019  . Open toe wound 12/01/2018  . Traumatic rhabdomyolysis ([place]) 11/30/2018  . Cellulitis 11/30/2018  . Transaminitis 11/30/2018  . Abnormal urinalysis 11/30/2018  . Leukocytosis 11/30/2018  . Hypokalemia 11/30/2018  . Scalp laceration   . Skull fracture ([place]) 07/13/2016  . Chronic left shoulder pain 04/03/2016  . Diabetes mellitus without complication ([place]) 0000000  . Hearing loss 06/21/2015  . Advance care planning 12/17/2014  . Palpitations 07/06/2014  . Pain in joint, ankle and foot 04/20/2014  . DVT, lower extremity ([place]) 11/19/2013  . Edema 10/20/2013  . Headache(784.0) 10/20/2013  . Left leg swelling 10/20/2013  . Aneurysm of basilar artery ([facility]) 10/20/2013  . Bradycardia 10/20/2013  . Vitamin B12 deficiency 07/16/2013  . Paresthesia of both feet 05/17/2013  . TMJ arthritis 09/11/2012  . Ear pain 09/11/2012  . History of transfusion of packed red blood cells 09/11/2012  . Spinal stenosis 04/13/2011  . Well adult exam 01/23/2011  . Cholecystitis with cholelithiasis, s/p lap chole 11/10/2010 12/01/2010  . Shoulder pain, right 07/25/2010  . TOBACCO USE, QUIT 03/09/2009  . KNEE PAIN 05/18/2008  . PN (peripheral neuropathy) 08/19/2007  . LEG PAIN 08/19/2007  . Vitamin D deficiency 02/17/2007  . INSOMNIA, PERSISTENT 02/17/2007  . Essential hypertension 02/17/2007  . GERD 02/17/2007  . OSTEOARTHRITIS 02/17/2007  . LOW BACK PAIN 02/17/2007  . Hyperglycemia 01/28/2007  . UNSPECIFIED OSTEOPOROSIS 01/28/2007  . PRECIPITOUS DROP IN HEMATOCRIT 01/28/2007      Labs reviewed: Basic Metabolic Panel:    Component Value Date/Time   NA 142 03/10/2019 0656   K 3.8 03/10/2019 0656   CL 109 03/10/2019 0656   CO2 22 03/10/2019 0656   GLUCOSE 128 (H) 03/10/2019 0656   GLUCOSE 129 (H) 02/06/2006 1105   BUN 10  03/10/2019 0656   CREATININE 0.61 03/10/2019 0656   CALCIUM 9.2 03/10/2019 0656   PROT 5.0 (L) 03/07/2019 0421   ALBUMIN 2.7 (L) 03/07/2019 0421   AST 73 (H) 03/07/2019 0421   ALT 48 (H) 03/07/2019 0421   ALKPHOS 68 03/07/2019 0421   BILITOT 1.0 03/07/2019 0421   GFRNONAA >60 03/10/2019 0656   GFRAA >60 03/10/2019 0656    Recent Labs    03/05/19 1445 03/05/19 1453 03/07/19 0421 03/07/19 0421 03/08/19 0433 03/09/19 0751 03/10/19 0656  NA 139   < > 142   < > 144 141 142  K 4.4   < > 3.1*   < > 3.5 3.4* 3.8  CL 106   < > 113*   < > 115* 110 109  CO2 20*   < > 19*   < >  22 23 22   GLUCOSE 150*   < > 115*   < > 112* 129* 128*  BUN 30*   < > 27*   < > 23 13 10   CREATININE 0.82   < > 0.70   < > 0.63 0.61 0.61  CALCIUM 10.1   < > 9.0   < > 8.7* 8.8* 9.2  MG 2.0  --  1.7  --   --   --  1.7  PHOS 3.0  --   --   --   --   --   --    < > = values in this interval not displayed.   Liver Function Tests: Recent Labs    03/05/19 1445 03/06/19 0440 03/07/19 0421  AST 127* 111* 73*  ALT 46* 48* 48*  ALKPHOS 91 66 68  BILITOT 1.2 1.0 1.0  PROT 6.3* 4.9* 5.0*  ALBUMIN 3.9 2.9* 2.7*   Recent Labs    03/05/19 1445  LIPASE 14   No results for input(s): AMMONIA in the last 8760 hours. CBC: Recent Labs    11/30/18 1232 12/01/18 0633 03/05/19 1445 03/05/19 1453 03/07/19 0421 03/08/19 0433 03/09/19 0751  WBC 12.7*   < > 21.1*   < > 11.4* 8.2 6.8  NEUTROABS 10.5*  --  18.2*  --   --  5.4  --   HGB 13.6   < > 13.8   < > 11.5* 11.2* 11.9*  HCT 41.9   < > 42.8   < > 34.9* 34.6* 36.9  MCV 92.9   < > 92.0   < > 91.4 92.3 92.5  PLT 174   < > 193   < > 128* 150 168   < > = values in this interval not displayed.   Lipid No results for input(s): CHOL, HDL, LDLCALC, TRIG in the last 8760 hours.  Cardiac Enzymes: Recent Labs    03/05/19 1445 03/06/19 0440 03/09/19 0751  CKTOTAL 3,107* 1,655* 204   BNP: No results for input(s): BNP in the last 8760 hours. Lab Results    Component Value Date   MICROALBUR 3.3 (H) 05/01/2017   Lab Results  Component Value Date   HGBA1C 5.8 (H) 03/06/2019   Lab Results  Component Value Date   TSH 1.15 12/12/2016   Lab Results  Component Value Date   VITAMINB12 >1500 (H) 10/13/2013   No results found for: FOLATE No results found for: IRON, TIBC, FERRITIN  Imaging and Procedures obtained prior to SNF admission: DG Knee 2 Views Right  Result Date: 03/05/2019 CLINICAL DATA:  Status post fall. EXAM: RIGHT KNEE - 1-2 VIEW COMPARISON:  None. FINDINGS: There is a right knee replacement without evidence of surrounding lucency to suggest the presence of hardware loosening or infection. No evidence of fracture, dislocation, or joint effusion. Soft tissues are unremarkable. IMPRESSION: Right knee replacement without evidence of hardware loosening or infection. Electronically Signed   By: Virgina Norfolk M.D.   On: 03/05/2019 15:35   CT Head Wo Contrast  Result Date: 03/05/2019 CLINICAL DATA:  Fall occurring yesterday. Patient found lying on the floor. Headache. EXAM: CT HEAD WITHOUT CONTRAST CT CERVICAL SPINE WITHOUT CONTRAST TECHNIQUE: Multidetector CT imaging of the head and cervical spine was performed following the standard protocol without intravenous contrast. Multiplanar CT image reconstructions of the cervical spine were also generated. COMPARISON:  11/30/2018 FINDINGS: CT HEAD FINDINGS Brain: No evidence of acute infarction, hemorrhage, hydrocephalus, extra-axial collection or mass lesion/mass effect. There are changes  from a previous occipital craniotomy. Metal vascular clips and possible coil material as well as calcifications are noted along the foramen magnum, stable compared to the prior head CT. Mild periventricular white matter hypoattenuation is noted consistent with chronic microvascular ischemic change. Vascular: Fairly prominent carotid calcifications. No hyperattenuating artery. Skull: No acute fracture or bone  lesion. Sinuses/Orbits: Globes and orbits are unremarkable. Sinuses and mastoid air cells are clear. Other: None. CT CERVICAL SPINE FINDINGS Alignment: Normal. Skull base and vertebrae: No acute fracture. No primary bone lesion or focal pathologic process. Stable surgical changes from a previous suboccipital craniotomy. Soft tissues and spinal canal: No prevertebral fluid or swelling. No visible canal hematoma. Disc levels: Spondylotic disc bulging throughout the cervical spine. Moderate loss of disc height at C4-C5 and C5-C6 with moderate to severe loss of disc height at C6-C7. No convincing disc herniation. Upper chest: No acute findings.  Scarring in the lung apices. Other: None. IMPRESSION: HEAD CT 1. No acute intracranial abnormalities. 2. No skull fracture. CERVICAL CT 1. No fracture or acute finding. Electronically Signed   By: Lajean Manes M.D.   On: 03/05/2019 17:22   CT Chest W Contrast  Result Date: 03/05/2019 CLINICAL DATA:  86 year old female with trauma. EXAM: CT CHEST, ABDOMEN, AND PELVIS WITH CONTRAST TECHNIQUE: Multidetector CT imaging of the chest, abdomen and pelvis was performed following the standard protocol during bolus administration of intravenous contrast. CONTRAST:  115mL OMNIPAQUE IOHEXOL 300 MG/ML  SOLN COMPARISON:  Chest radiograph dated 11/30/2018. FINDINGS: Evaluation of this exam is limited due to respiratory motion artifact. CT CHEST FINDINGS Cardiovascular: There is no cardiomegaly or pericardial effusion. Three-vessel coronary vascular calcification. There is calcification of the mitral annulus. There is moderate atherosclerotic calcification of the thoracic aorta. No aneurysmal dilatation or dissection. The origins of the great vessels of the aortic arch appear patent as visualized. A the central pulmonary arteries are grossly unremarkable for the degree of opacification. Mediastinum/Nodes: There is no hilar or mediastinal adenopathy. The esophagus is grossly unremarkable.  No mediastinal fluid collection. There is an 8 mm right thyroid hypodense nodule. Lungs/Pleura: Bilateral lower lobe linear atelectasis/scarring noted. Confluent areas of hazy density throughout the lungs likely represents areas of atelectatic changes or air trapping related to underlying small vessel versus small airway disease. No lobar consolidation, pleural effusion, or pneumothorax. The central airways are patent. Musculoskeletal: No acute osseous pathology. Osteopenia with degenerative changes of the spine. A focus of sclerosis involving the left posterior seventh rib at the costovertebral junction likely a bone island. CT ABDOMEN PELVIS FINDINGS No intra-abdominal free air or free fluid. Hepatobiliary: The liver is unremarkable. No intrahepatic biliary ductal dilatation. Cholecystectomy. Mildly dilated common bile duct, likely post cholecystectomy. No retained calcified stone noted in the central CBD. Pancreas: Unremarkable. No pancreatic ductal dilatation or surrounding inflammatory changes. Spleen: Normal in size without focal abnormality. Adrenals/Urinary Tract: The adrenal glands are unremarkable. There is no hydronephrosis on either side. There is symmetric enhancement and excretion of contrast by both kidneys. The visualized ureters and urinary bladder appear unremarkable. Stomach/Bowel: There is sigmoid diverticulosis without active inflammatory changes. There is no bowel obstruction or active inflammation. The appendix is not visualized with certainty. No inflammatory changes identified in the right lower quadrant. Vascular/Lymphatic: Moderate aortoiliac atherosclerotic disease. The IVC is grossly unremarkable. No portal venous gas. There is no adenopathy. Reproductive: The uterus is anteverted. There is mild thickened appearance of the endometrium measuring approximately 7 mm in thickness. Dedicated pelvic ultrasound is recommended for better  evaluation on a nonemergent/outpatient basis. No adnexal  masses identified. Other: There is a 3 cm umbilical hernia with protrusion of anterior wall of a segment of colon. No obstruction Musculoskeletal: There is osteopenia with degenerative changes of the spine. Grade 1 L5-S1 retrolisthesis. No acute osseous pathology. IMPRESSION: 1. No acute/traumatic intrathoracic, abdominal, or pelvic pathology. 2. Thickened appearance of the endometrium. Further evaluation with pelvic ultrasound on a nonemergent/outpatient basis recommended. 3.  Aortic Atherosclerosis (ICD10-I70.0). Electronically Signed   By: Anner Crete M.D.   On: 03/05/2019 17:35   CT Cervical Spine Wo Contrast  Result Date: 03/05/2019 CLINICAL DATA:  Fall occurring yesterday. Patient found lying on the floor. Headache. EXAM: CT HEAD WITHOUT CONTRAST CT CERVICAL SPINE WITHOUT CONTRAST TECHNIQUE: Multidetector CT imaging of the head and cervical spine was performed following the standard protocol without intravenous contrast. Multiplanar CT image reconstructions of the cervical spine were also generated. COMPARISON:  11/30/2018 FINDINGS: CT HEAD FINDINGS Brain: No evidence of acute infarction, hemorrhage, hydrocephalus, extra-axial collection or mass lesion/mass effect. There are changes from a previous occipital craniotomy. Metal vascular clips and possible coil material as well as calcifications are noted along the foramen magnum, stable compared to the prior head CT. Mild periventricular white matter hypoattenuation is noted consistent with chronic microvascular ischemic change. Vascular: Fairly prominent carotid calcifications. No hyperattenuating artery. Skull: No acute fracture or bone lesion. Sinuses/Orbits: Globes and orbits are unremarkable. Sinuses and mastoid air cells are clear. Other: None. CT CERVICAL SPINE FINDINGS Alignment: Normal. Skull base and vertebrae: No acute fracture. No primary bone lesion or focal pathologic process. Stable surgical changes from a previous suboccipital  craniotomy. Soft tissues and spinal canal: No prevertebral fluid or swelling. No visible canal hematoma. Disc levels: Spondylotic disc bulging throughout the cervical spine. Moderate loss of disc height at C4-C5 and C5-C6 with moderate to severe loss of disc height at C6-C7. No convincing disc herniation. Upper chest: No acute findings.  Scarring in the lung apices. Other: None. IMPRESSION: HEAD CT 1. No acute intracranial abnormalities. 2. No skull fracture. CERVICAL CT 1. No fracture or acute finding. Electronically Signed   By: Lajean Manes M.D.   On: 03/05/2019 17:22   CT Abdomen Pelvis W Contrast  Result Date: 03/05/2019 CLINICAL DATA:  86 year old female with trauma. EXAM: CT CHEST, ABDOMEN, AND PELVIS WITH CONTRAST TECHNIQUE: Multidetector CT imaging of the chest, abdomen and pelvis was performed following the standard protocol during bolus administration of intravenous contrast. CONTRAST:  119mL OMNIPAQUE IOHEXOL 300 MG/ML  SOLN COMPARISON:  Chest radiograph dated 11/30/2018. FINDINGS: Evaluation of this exam is limited due to respiratory motion artifact. CT CHEST FINDINGS Cardiovascular: There is no cardiomegaly or pericardial effusion. Three-vessel coronary vascular calcification. There is calcification of the mitral annulus. There is moderate atherosclerotic calcification of the thoracic aorta. No aneurysmal dilatation or dissection. The origins of the great vessels of the aortic arch appear patent as visualized. A the central pulmonary arteries are grossly unremarkable for the degree of opacification. Mediastinum/Nodes: There is no hilar or mediastinal adenopathy. The esophagus is grossly unremarkable. No mediastinal fluid collection. There is an 8 mm right thyroid hypodense nodule. Lungs/Pleura: Bilateral lower lobe linear atelectasis/scarring noted. Confluent areas of hazy density throughout the lungs likely represents areas of atelectatic changes or air trapping related to underlying small vessel  versus small airway disease. No lobar consolidation, pleural effusion, or pneumothorax. The central airways are patent. Musculoskeletal: No acute osseous pathology. Osteopenia with degenerative changes of the spine. A focus of  sclerosis involving the left posterior seventh rib at the costovertebral junction likely a bone island. CT ABDOMEN PELVIS FINDINGS No intra-abdominal free air or free fluid. Hepatobiliary: The liver is unremarkable. No intrahepatic biliary ductal dilatation. Cholecystectomy. Mildly dilated common bile duct, likely post cholecystectomy. No retained calcified stone noted in the central CBD. Pancreas: Unremarkable. No pancreatic ductal dilatation or surrounding inflammatory changes. Spleen: Normal in size without focal abnormality. Adrenals/Urinary Tract: The adrenal glands are unremarkable. There is no hydronephrosis on either side. There is symmetric enhancement and excretion of contrast by both kidneys. The visualized ureters and urinary bladder appear unremarkable. Stomach/Bowel: There is sigmoid diverticulosis without active inflammatory changes. There is no bowel obstruction or active inflammation. The appendix is not visualized with certainty. No inflammatory changes identified in the right lower quadrant. Vascular/Lymphatic: Moderate aortoiliac atherosclerotic disease. The IVC is grossly unremarkable. No portal venous gas. There is no adenopathy. Reproductive: The uterus is anteverted. There is mild thickened appearance of the endometrium measuring approximately 7 mm in thickness. Dedicated pelvic ultrasound is recommended for better evaluation on a nonemergent/outpatient basis. No adnexal masses identified. Other: There is a 3 cm umbilical hernia with protrusion of anterior wall of a segment of colon. No obstruction Musculoskeletal: There is osteopenia with degenerative changes of the spine. Grade 1 L5-S1 retrolisthesis. No acute osseous pathology. IMPRESSION: 1. No acute/traumatic  intrathoracic, abdominal, or pelvic pathology. 2. Thickened appearance of the endometrium. Further evaluation with pelvic ultrasound on a nonemergent/outpatient basis recommended. 3.  Aortic Atherosclerosis (ICD10-I70.0). Electronically Signed   By: Anner Crete M.D.   On: 03/05/2019 17:35   DG Hip Unilat With Pelvis 2-3 Views Right  Result Date: 03/05/2019 CLINICAL DATA:  Status post fall yesterday. EXAM: DG HIP (WITH OR WITHOUT PELVIS) 2-3V RIGHT COMPARISON:  None. FINDINGS: Generalized osteopenia. No fracture or dislocation. No aggressive osseous lesion. Mild osteoarthritis bilateral SI joints. IMPRESSION: No acute osseous injury of the right hip. Given the patient's age and osteopenia, if there is persistent clinical concern for an occult hip fracture, a MRI of the hip is recommended for increased sensitivity. Electronically Signed   By: Kathreen Devoid   On: 03/05/2019 15:34     Not all labs, radiology exams or other studies done during hospitalization come through on my EPIC note; however they are reviewed by me.    Assessment and Plan  Sepsis/UTI-treated with IV Rocephin, grew out staph epi and completed treatment with Macrobid SNF-admitted for OT/PT  Rhabdomyolysis-resolved with IV fluids SNF-follow-up BMP; continue to keep hydrated  Hypertension SNF-controlled; continue Norvasc 10 mg daily, hydralazine 25 mg every 8 hours and lisinopril 20 mg daily  Restless leg syndrome SNF-no complaints; continue ropinirole 0.25 mg nightly  Diabetes mellitus SNF-diet controlled; low-carb diet   Time spent greater than 45 minutes;> 50% of time with patient was spent reviewing records, labs, tests and studies, counseling and developing plan of care  Hennie Duos, MD

## 2019-03-14 ENCOUNTER — Encounter: Payer: Self-pay | Admitting: Internal Medicine

## 2019-03-14 DIAGNOSIS — G2581 Restless legs syndrome: Secondary | ICD-10-CM | POA: Insufficient documentation

## 2019-03-16 LAB — BASIC METABOLIC PANEL
BUN: 32 — AB (ref 4–21)
CO2: 23 — AB (ref 13–22)
Chloride: 109 — AB (ref 99–108)
Creatinine: 0.6 (ref 0.5–1.1)
Glucose: 130
Potassium: 3.7 (ref 3.4–5.3)
Sodium: 142 (ref 137–147)

## 2019-03-16 LAB — COMPREHENSIVE METABOLIC PANEL
Calcium: 9.6 (ref 8.7–10.7)
GFR calc Af Amer: >90
GFR calc non Af Amer: 78.86

## 2019-03-16 LAB — CBC AND DIFFERENTIAL
HCT: 35 — AB (ref 36–46)
Hemoglobin: 11.9 — AB (ref 12.0–16.0)
Platelets: 181 (ref 150–399)
WBC: 8.4

## 2019-03-16 LAB — CBC: RBC: 3.91 (ref 3.87–5.11)

## 2019-03-18 ENCOUNTER — Ambulatory Visit: Payer: Medicare Other | Admitting: Family Medicine

## 2019-03-18 ENCOUNTER — Other Ambulatory Visit: Payer: Self-pay | Admitting: *Deleted

## 2019-03-18 DIAGNOSIS — Z0289 Encounter for other administrative examinations: Secondary | ICD-10-CM

## 2019-03-18 NOTE — Patient Outreach (Signed)
Screened for potential [facility] Care Management needs as a benefit of  [employer].  Mrs. Woodburn is currently receiving skilled therapy at [place] attended telephonic interdisciplinary team meeting to assess for disposition needs and transition plan for resident.   Facility reports member lived alone prior. Care plan meeting was today. Facility states member is adamant about returning home at [facility] dc.   Will plan outreach to discuss [facility] services and transition plans.   [name], MSN-Ed, RN,BSN [place] Acute Care Coordinator [phone] [facility]) [phone]  (Toll free office)

## 2019-03-27 ENCOUNTER — Other Ambulatory Visit: Payer: Self-pay | Admitting: *Deleted

## 2019-03-27 NOTE — Patient Outreach (Signed)
Member screened for potential [facility] Care Management needs as a benefit of [place] Medicare.  Kristina Wilkinson is currently receiving skilled therapy at [facility].   Communication sent to [employer] to inquire about transition plans prior to [employer] for potential [place] Management services.   [name], MSN-Ed, RN,BSN [place] Acute Care Coordinator [phone] [facility]) [phone]  (Toll free office)

## 2019-03-31 ENCOUNTER — Other Ambulatory Visit: Payer: Self-pay | Admitting: *Deleted

## 2019-03-31 NOTE — Patient Outreach (Signed)
Member screened for potential [facility] Care Management needs as a benefit of [place] [employer].  Kristina Wilkinson is receiving skilled therapy at [facility].   Telephone call made to [facility] son [name] [phone] to obtain Kristina Wilkinson's mobile number.   Telephone call made to Kristina Wilkinson at [phone]. Patient identifiers confirmed. Kristina Wilkinson confirms she is discharging to home on this Friday 04/03/19. She endorses that she lives alone and has 4 people that check on her. Her nephew [name] lives nearby and assists her as needed. Her son [name] lives in [place] and is not able to assist as the others. Kristina Wilkinson states she has a friend that helps her pay her bills, one that does grocery shopping, and one that comes to clean her house. Kristina Wilkinson reports she is in the process of purchasing a medical alert system. States she has already spoken to someone about it but her paperwork is at home.   Explained [facility] services. Kristina Wilkinson is agreeable to [facility] LCSW for mobile meals, potential transportation needs, and community resources such as follow up regarding medical alert system being secured as well as ongoing long term care planning discussions. Also agreeable to [place] for complex case management.   Kristina Wilkinson confirms her PCP is with Financial controller at [facility]. She sees Clarene Reamer, NP.  Explained [facility] Care Management will not interfere or replace services provided by home health.   Will make referrals for [facility] RNCM and [facility] LCSW upon SNF dc.   Kristina Wilkinson has history of HTN, DM, DVT, rhabdocytosis, sepsis, UTI, fall.   [name], MSN-Ed, RN,BSN [place] Acute Care Coordinator [phone] [facility]) [phone]  (Toll free office)

## 2019-04-01 ENCOUNTER — Non-Acute Institutional Stay (SKILLED_NURSING_FACILITY): Payer: Medicare Other | Admitting: Internal Medicine

## 2019-04-01 ENCOUNTER — Encounter: Payer: Self-pay | Admitting: Internal Medicine

## 2019-04-01 DIAGNOSIS — T796XXD Traumatic ischemia of muscle, subsequent encounter: Secondary | ICD-10-CM | POA: Diagnosis not present

## 2019-04-01 DIAGNOSIS — I10 Essential (primary) hypertension: Secondary | ICD-10-CM | POA: Diagnosis not present

## 2019-04-01 DIAGNOSIS — E119 Type 2 diabetes mellitus without complications: Secondary | ICD-10-CM

## 2019-04-01 DIAGNOSIS — N390 Urinary tract infection, site not specified: Secondary | ICD-10-CM | POA: Diagnosis not present

## 2019-04-01 DIAGNOSIS — A419 Sepsis, unspecified organism: Secondary | ICD-10-CM | POA: Diagnosis not present

## 2019-04-01 NOTE — Progress Notes (Signed)
Location:    [facility] Room Number: 505/P Place of Service:  SNF [phone])  Provider: Estill Batten  PCP: Margit Hanks, MD Patient Care Team: Margit Hanks, MD as PCP - General (Internal Medicine) Valeria Batman, MD (Orthopedic Surgery)  Extended Emergency Contact Information Primary Emergency Contact: Rhew,Tony Home Phone: [phone] Mobile Phone: [phone] Relation: Son Secondary Emergency Contact: [facility] Phone: [phone] Mobile Phone: [phone] Relation: Nephew  Code Status: Full Code Goals of care:  Advanced Directive information Advanced Directives 04/01/2019  Does Patient Have a Medical Advance Directive? Yes  Type of Advance Directive (No Data)  Does patient want to make changes to medical advance directive? No - Patient declined  Would patient like information on creating a medical advance directive? -     Allergies  Allergen Reactions  . Gabapentin Other (See Comments)    Made the legs "go numb"  . Lyrica [Pregabalin] Other (See Comments)    "Made me pass out"  . Other Other (See Comments)    "MD mixed a blood pressure medication with the Clinoril I was taking and I couldn't walk"  . Penicillins Other (See Comments)    Has patient had a PCN reaction causing immediate rash, facial/tongue/throat swelling, SOB or lightheadedness with hypotension: Unk- exact reaction not recalled, but is allergic Has patient had a PCN reaction causing severe rash involving mucus membranes or skin necrosis: Unk Has patient had a PCN reaction that required hospitalization: Unk Has patient had a PCN reaction occurring within the last 10 years: Yes If all of the above answers are "NO", then may proceed with Cephalosporin use.   Arlana Lindau D-Vit E-Safflower Oil Other (See Comments)    "Made me achy and sick"    Chief Complaint  Patient presents with  . Discharge Note    Discharge Visit    HPI:  86 y.o. female seen  today for discharge from facility slated for Friday, February 19.  Patient has been here for rehab and strengthening after hospitalization after a fall at home.  She was found to have rhabdo myelosis as well as a UTI.  She did respond to IV fluids.  UTI was treated with antibiotics including Rocephin and Macrobid.  She has worked with therapy here and has done well.  She apparently did have a fall recently  with no injuries-I did speak with her about being very careful when she ambulates she expressed understanding-nursing staff says she has done well  She will need continued PT and OT at home as well as nursing support.  She is quite confident she will do well going home nursing does not report any recent issues.  She is a type II diabetic this is diet controlled CBG today was 120.  She also has hypertension which appears to be stable-recent blood pressures have been in the 130s.  She is on lisinopril Norvasc as well as hydralazine.  She does have a short term order for Xanax for anxiety but nursing states she takes this rarely.  She also apparently is not using her Robaxin.  She does have a history of a right foot blister this will need traversing follow-up.  Currently she is sitting in her chair comfortably she has no complaints she is very much looking forward to going home    Past Medical History:  Diagnosis Date  . Arthritis   . CNS aneurysm 1983   s/p clipping  . Diabetes mellitus type II  diet controlled   . DVT (deep venous thrombosis) (HCC) 10/15  . Elevated glucose   . Hypertension   . Insomnia   . Knee pain    Left Dr. Tomasa Blase  . LBP (low back pain)   . PN (peripheral neuropathy)    unknown etiol    Past Surgical History:  Procedure Laterality Date  . CEREBRAL ANEURYSM REPAIR     initial repair in the 1980s, with second surgery in the 1990s  . CHOLECYSTECTOMY    . JOINT REPLACEMENT    . KNEE ARTHROPLASTY  04/2009   Left  . TOTAL KNEE  ARTHROPLASTY    . TUBAL LIGATION        reports that she has quit smoking. She has never used smokeless tobacco. She reports that she does not drink alcohol or use drugs. Social History   Socioeconomic History  . Marital status: Widowed    Spouse name: Not on file  . Number of children: 1  . Years of education: Not on file  . Highest education level: Not on file  Occupational History  . Occupation: retired    Associate Professor: RETIRED  Tobacco Use  . Smoking status: Former Games developer  . Smokeless tobacco: Never Used  . Tobacco comment: quit in 06-Mar-1981  Substance and Sexual Activity  . Alcohol use: No    Alcohol/week: 0.0 standard drinks  . Drug use: No  . Sexual activity: Not Currently  Other Topics Concern  . Not on file  Social History Narrative   Lives alone in a [place] home.  Husband passed away in Mar 07, 1999.   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   1 son, not local.     Enjoys shopping   Still drives, uses a cane.     Social Determinants of Health   Financial Resource Strain:   . Difficulty of Paying Living Expenses: Not on file  Food Insecurity:   . Worried About Programme researcher, broadcasting/film/video in the Last Year: Not on file  . Ran Out of Food in the Last Year: Not on file  Transportation Needs:   . Lack of Transportation (Medical): Not on file  . Lack of Transportation (Non-Medical): Not on file  Physical Activity:   . Days of Exercise per Week: Not on file  . Minutes of Exercise per Session: Not on file  Stress:   . Feeling of Stress : Not on file  Social Connections:   . Frequency of Communication with Friends and Family: Not on file  . Frequency of Social Gatherings with Friends and Family: Not on file  . Attends Religious Services: Not on file  . Active Member of Clubs or Organizations: Not on file  . Attends Banker Meetings: Not on file  . Marital Status: Not on file  Intimate Partner Violence:   . Fear of Current or Ex-Partner: Not on file  .  Emotionally Abused: Not on file  . Physically Abused: Not on file  . Sexually Abused: Not on file   Functional Status Survey:    Allergies  Allergen Reactions  . Gabapentin Other (See Comments)    Made the legs "go numb"  . Lyrica [Pregabalin] Other (See Comments)    "Made me pass out"  . Other Other (See Comments)    "MD mixed a blood pressure medication with the Clinoril I was taking and I couldn't walk"  . Penicillins Other (See Comments)    Has patient had a PCN reaction causing immediate  rash, facial/tongue/throat swelling, SOB or lightheadedness with hypotension: Unk- exact reaction not recalled, but is allergic Has patient had a PCN reaction causing severe rash involving mucus membranes or skin necrosis: Unk Has patient had a PCN reaction that required hospitalization: Unk Has patient had a PCN reaction occurring within the last 10 years: Yes If all of the above answers are "NO", then may proceed with Cephalosporin use.   Arlana Lindau D-Vit E-Safflower Oil Other (See Comments)    "Made me achy and sick"    Pertinent  Health Maintenance Due  Topic Date Due  . FOOT EXAM  04/17/1939  . DEXA SCAN  04/17/1994  . OPHTHALMOLOGY EXAM  01/12/2018  . INFLUENZA VACCINE  09/13/2018  . HEMOGLOBIN A1C  09/03/2019  . PNA vac Low Risk Adult  Completed    Medications: Allergies as of 04/01/2019      Reactions   Gabapentin Other (See Comments)   Made the legs "go numb"   Lyrica [pregabalin] Other (See Comments)   "Made me pass out"   Other Other (See Comments)   "MD mixed a blood pressure medication with the Clinoril I was taking and I couldn't walk"   Penicillins Other (See Comments)   Has patient had a PCN reaction causing immediate rash, facial/tongue/throat swelling, SOB or lightheadedness with hypotension: Unk- exact reaction not recalled, but is allergic Has patient had a PCN reaction causing severe rash involving mucus membranes or skin necrosis: Unk Has patient had a PCN reaction  that required hospitalization: Unk Has patient had a PCN reaction occurring within the last 10 years: Yes If all of the above answers are "NO", then may proceed with Cephalosporin use.   Vit D-vit E-safflower Oil Other (See Comments)   "Made me achy and sick"      Medication List       Accurate as of April 01, 2019 12:41 PM. If you have any questions, ask your nurse or doctor.        ALPRAZolam 0.5 MG tablet Commonly known as: XANAX Take 0.5 mg by mouth 2 (two) times daily as needed for anxiety. For anxiety/panic attacks x 14 days   amLODipine 10 MG tablet Commonly known as: NORVASC Take 1 tablet (10 mg total) by mouth daily.   feeding supplement (PRO-STAT SUGAR FREE 64) Liqd Take 30 mLs by mouth 2 (two) times daily. Wound healing   hydrALAZINE 25 MG tablet Commonly known as: APRESOLINE Take 1 tablet (25 mg total) by mouth every 8 (eight) hours.   lisinopril 20 MG tablet Commonly known as: ZESTRIL Take 1 tablet (20 mg total) by mouth daily.   methocarbamol 500 MG tablet Commonly known as: ROBAXIN Take 500 mg by mouth 3 (three) times daily. For muscle spasms   NON FORMULARY DIET: REGULAR, NAS, CCD, HEART HEALTHY DIET   rOPINIRole 0.25 MG tablet Commonly known as: REQUIP Take 0.25 mg by mouth at bedtime.       Review of Systems   In general she is not complaining of any fever or chills.  Skin does not complain of rashes itching does have a blister on her right heel which has been followed by nursing.  Head ears eyes nose mouth and throat is not complaining of any visual changes or sore throat.  Respiratory does not complain of being short of breath or having a cough.  Cardiac no complaints of chest pain or increased edema from baseline.  GI does not complain of abdominal pain nausea vomiting diarrhea constipation.  GU no complaints of dysuria.  Musculoskeletal is not complaining of joint pain at this time.  She has gained strength working with  therapy.  Neurologic does not complain of dizziness headache numbness says her weakness has improved.   psych does have some history of anxiety but per nursing this is stable and rarely uses her Xanax     Vitals:   04/01/19 1230  BP: 130/65  Pulse: 82  Resp: 16  Temp: (!) 97.5 F (36.4 C)  TempSrc: Oral  SpO2: 96%  Weight: 163 lb 6.4 oz (74.1 kg)  Height: 5\' 3"  (1.6 m)   Body mass index is 28.95 kg/m. Physical Exam   In general this is a pleasant elderly female in no distress she is bright and alert.  Her skin is warm and dry she does have covering over her right heel with history of a blister-apparently this is stable.  Eyes visual acuity appears to be intact sclera and conjunctive are clear.  Oropharynx is clear mucous membranes moist.  Chest is clear to auscultation there is no labored breathing.  Heart is regular rate and rhythm she has what appears to be chronic baseline lower extremity edema and venous stasis changes.  Abdomen is obese soft nontender with positive bowel sounds.  Musculoskeletal moves all extremities x4 at baseline she is using her walker.  Neurologic is grossly intact without lateralizing findings her speech is clear.  Psych she is alert and oriented very pleasant and appropriate  Labs reviewed  March 16, 2019.  WBC 8.4 hemoglobin 11.9 platelets 181.  Sodium 142 potassium 3.7 BUN 32.2 creatinine 0.64: Basic Metabolic Panel: Recent Labs    03/05/19 1445 03/05/19 1453 03/07/19 0421 03/07/19 0421 03/08/19 0433 03/09/19 0751 03/10/19 0656  NA 139   < > 142   < > 144 141 142  K 4.4   < > 3.1*   < > 3.5 3.4* 3.8  CL 106   < > 113*   < > 115* 110 109  CO2 20*   < > 19*   < > 22 23 22   GLUCOSE 150*   < > 115*   < > 112* 129* 128*  BUN 30*   < > 27*   < > 23 13 10   CREATININE 0.82   < > 0.70   < > 0.63 0.61 0.61  CALCIUM 10.1   < > 9.0   < > 8.7* 8.8* 9.2  MG 2.0  --  1.7  --   --   --  1.7  PHOS 3.0  --   --   --   --   --   --     < > = values in this interval not displayed.   Liver Function Tests: Recent Labs    03/05/19 1445 03/06/19 0440 03/07/19 0421  AST 127* 111* 73*  ALT 46* 48* 48*  ALKPHOS 91 66 68  BILITOT 1.2 1.0 1.0  PROT 6.3* 4.9* 5.0*  ALBUMIN 3.9 2.9* 2.7*   Recent Labs    03/05/19 1445  LIPASE 14   No results for input(s): AMMONIA in the last 8760 hours. CBC: Recent Labs    11/30/18 1232 12/01/18 0633 03/05/19 1445 03/05/19 1453 03/07/19 0421 03/08/19 0433 03/09/19 0751  WBC 12.7*   < > 21.1*   < > 11.4* 8.2 6.8  NEUTROABS 10.5*  --  18.2*  --   --  5.4  --   HGB 13.6   < > 13.8   < >  11.5* 11.2* 11.9*  HCT 41.9   < > 42.8   < > 34.9* 34.6* 36.9  MCV 92.9   < > 92.0   < > 91.4 92.3 92.5  PLT 174   < > 193   < > 128* 150 168   < > = values in this interval not displayed.   Cardiac Enzymes: Recent Labs    03/05/19 1445 03/06/19 0440 03/09/19 0751  CKTOTAL 3,107* 1,655* 204   BNP: Invalid input(s): POCBNP CBG: Recent Labs    03/10/19 0738 03/10/19 1121 03/10/19 1637  GLUCAP 120* 131* 145*    Procedures and Imaging Studies During Stay: DG Knee 2 Views Right  Result Date: 03/05/2019 CLINICAL DATA:  Status post fall. EXAM: RIGHT KNEE - 1-2 VIEW COMPARISON:  None. FINDINGS: There is a right knee replacement without evidence of surrounding lucency to suggest the presence of hardware loosening or infection. No evidence of fracture, dislocation, or joint effusion. Soft tissues are unremarkable. IMPRESSION: Right knee replacement without evidence of hardware loosening or infection. Electronically Signed   By: Aram Candela M.D.   On: 03/05/2019 15:35   CT Head Wo Contrast  Result Date: 03/05/2019 CLINICAL DATA:  Fall occurring yesterday. Patient found lying on the floor. Headache. EXAM: CT HEAD WITHOUT CONTRAST CT CERVICAL SPINE WITHOUT CONTRAST TECHNIQUE: Multidetector CT imaging of the head and cervical spine was performed following the standard protocol without  intravenous contrast. Multiplanar CT image reconstructions of the cervical spine were also generated. COMPARISON:  11/30/2018 FINDINGS: CT HEAD FINDINGS Brain: No evidence of acute infarction, hemorrhage, hydrocephalus, extra-axial collection or mass lesion/mass effect. There are changes from a previous occipital craniotomy. Metal vascular clips and possible coil material as well as calcifications are noted along the foramen magnum, stable compared to the prior head CT. Mild periventricular white matter hypoattenuation is noted consistent with chronic microvascular ischemic change. Vascular: Fairly prominent carotid calcifications. No hyperattenuating artery. Skull: No acute fracture or bone lesion. Sinuses/Orbits: Globes and orbits are unremarkable. Sinuses and mastoid air cells are clear. Other: None. CT CERVICAL SPINE FINDINGS Alignment: Normal. Skull base and vertebrae: No acute fracture. No primary bone lesion or focal pathologic process. Stable surgical changes from a previous suboccipital craniotomy. Soft tissues and spinal canal: No prevertebral fluid or swelling. No visible canal hematoma. Disc levels: Spondylotic disc bulging throughout the cervical spine. Moderate loss of disc height at C4-C5 and C5-C6 with moderate to severe loss of disc height at C6-C7. No convincing disc herniation. Upper chest: No acute findings.  Scarring in the lung apices. Other: None. IMPRESSION: HEAD CT 1. No acute intracranial abnormalities. 2. No skull fracture. CERVICAL CT 1. No fracture or acute finding. Electronically Signed   By: Amie Portland M.D.   On: 03/05/2019 17:22   CT Chest W Contrast  Result Date: 03/05/2019 CLINICAL DATA:  86 year old female with trauma. EXAM: CT CHEST, ABDOMEN, AND PELVIS WITH CONTRAST TECHNIQUE: Multidetector CT imaging of the chest, abdomen and pelvis was performed following the standard protocol during bolus administration of intravenous contrast. CONTRAST:  OMNIPAQUE IOHEXOL 300  MG/ML  SOLN COMPARISON:  Chest radiograph dated 11/30/2018. FINDINGS: Evaluation of this exam is limited due to respiratory motion artifact. CT CHEST FINDINGS Cardiovascular: There is no cardiomegaly or pericardial effusion. Three-vessel coronary vascular calcification. There is calcification of the mitral annulus. There is moderate atherosclerotic calcification of the thoracic aorta. No aneurysmal dilatation or dissection. The origins of the great vessels of the aortic arch appear patent as visualized.  A the central pulmonary arteries are grossly unremarkable for the degree of opacification. Mediastinum/Nodes: There is no hilar or mediastinal adenopathy. The esophagus is grossly unremarkable. No mediastinal fluid collection. There is an 8 mm right thyroid hypodense nodule. Lungs/Pleura: Bilateral lower lobe linear atelectasis/scarring noted. Confluent areas of hazy density throughout the lungs likely represents areas of atelectatic changes or air trapping related to underlying small vessel versus small airway disease. No lobar consolidation, pleural effusion, or pneumothorax. The central airways are patent. Musculoskeletal: No acute osseous pathology. Osteopenia with degenerative changes of the spine. A focus of sclerosis involving the left posterior seventh rib at the costovertebral junction likely a bone island. CT ABDOMEN PELVIS FINDINGS No intra-abdominal free air or free fluid. Hepatobiliary: The liver is unremarkable. No intrahepatic biliary ductal dilatation. Cholecystectomy. Mildly dilated common bile duct, likely post cholecystectomy. No retained calcified stone noted in the central CBD. Pancreas: Unremarkable. No pancreatic ductal dilatation or surrounding inflammatory changes. Spleen: Normal in size without focal abnormality. Adrenals/Urinary Tract: The adrenal glands are unremarkable. There is no hydronephrosis on either side. There is symmetric enhancement and excretion of contrast by both kidneys. The  visualized ureters and urinary bladder appear unremarkable. Stomach/Bowel: There is sigmoid diverticulosis without active inflammatory changes. There is no bowel obstruction or active inflammation. The appendix is not visualized with certainty. No inflammatory changes identified in the right lower quadrant. Vascular/Lymphatic: Moderate aortoiliac atherosclerotic disease. The IVC is grossly unremarkable. No portal venous gas. There is no adenopathy. Reproductive: The uterus is anteverted. There is mild thickened appearance of the endometrium measuring approximately 7 mm in thickness. Dedicated pelvic ultrasound is recommended for better evaluation on a nonemergent/outpatient basis. No adnexal masses identified. Other: There is a 3 cm umbilical hernia with protrusion of anterior wall of a segment of colon. No obstruction Musculoskeletal: There is osteopenia with degenerative changes of the spine. Grade 1 L5-S1 retrolisthesis. No acute osseous pathology. IMPRESSION: 1. No acute/traumatic intrathoracic, abdominal, or pelvic pathology. 2. Thickened appearance of the endometrium. Further evaluation with pelvic ultrasound on a nonemergent/outpatient basis recommended. 3.  Aortic Atherosclerosis (ICD10-I70.0). Electronically Signed   By: Elgie Collard M.D.   On: 03/05/2019 17:35   CT Cervical Spine Wo Contrast  Result Date: 03/05/2019 CLINICAL DATA:  Fall occurring yesterday. Patient found lying on the floor. Headache. EXAM: CT HEAD WITHOUT CONTRAST CT CERVICAL SPINE WITHOUT CONTRAST TECHNIQUE: Multidetector CT imaging of the head and cervical spine was performed following the standard protocol without intravenous contrast. Multiplanar CT image reconstructions of the cervical spine were also generated. COMPARISON:  11/30/2018 FINDINGS: CT HEAD FINDINGS Brain: No evidence of acute infarction, hemorrhage, hydrocephalus, extra-axial collection or mass lesion/mass effect. There are changes from a previous occipital  craniotomy. Metal vascular clips and possible coil material as well as calcifications are noted along the foramen magnum, stable compared to the prior head CT. Mild periventricular white matter hypoattenuation is noted consistent with chronic microvascular ischemic change. Vascular: Fairly prominent carotid calcifications. No hyperattenuating artery. Skull: No acute fracture or bone lesion. Sinuses/Orbits: Globes and orbits are unremarkable. Sinuses and mastoid air cells are clear. Other: None. CT CERVICAL SPINE FINDINGS Alignment: Normal. Skull base and vertebrae: No acute fracture. No primary bone lesion or focal pathologic process. Stable surgical changes from a previous suboccipital craniotomy. Soft tissues and spinal canal: No prevertebral fluid or swelling. No visible canal hematoma. Disc levels: Spondylotic disc bulging throughout the cervical spine. Moderate loss of disc height at C4-C5 and C5-C6 with moderate to severe loss of  disc height at C6-C7. No convincing disc herniation. Upper chest: No acute findings.  Scarring in the lung apices. Other: None. IMPRESSION: HEAD CT 1. No acute intracranial abnormalities. 2. No skull fracture. CERVICAL CT 1. No fracture or acute finding. Electronically Signed   By: Amie Portland M.D.   On: 03/05/2019 17:22   CT Abdomen Pelvis W Contrast  Result Date: 03/05/2019 CLINICAL DATA:  86 year old female with trauma. EXAM: CT CHEST, ABDOMEN, AND PELVIS WITH CONTRAST TECHNIQUE: Multidetector CT imaging of the chest, abdomen and pelvis was performed following the standard protocol during bolus administration of intravenous contrast. CONTRAST:  OMNIPAQUE IOHEXOL 300 MG/ML  SOLN COMPARISON:  Chest radiograph dated 11/30/2018. FINDINGS: Evaluation of this exam is limited due to respiratory motion artifact. CT CHEST FINDINGS Cardiovascular: There is no cardiomegaly or pericardial effusion. Three-vessel coronary vascular calcification. There is calcification of the mitral  annulus. There is moderate atherosclerotic calcification of the thoracic aorta. No aneurysmal dilatation or dissection. The origins of the great vessels of the aortic arch appear patent as visualized. A the central pulmonary arteries are grossly unremarkable for the degree of opacification. Mediastinum/Nodes: There is no hilar or mediastinal adenopathy. The esophagus is grossly unremarkable. No mediastinal fluid collection. There is an 8 mm right thyroid hypodense nodule. Lungs/Pleura: Bilateral lower lobe linear atelectasis/scarring noted. Confluent areas of hazy density throughout the lungs likely represents areas of atelectatic changes or air trapping related to underlying small vessel versus small airway disease. No lobar consolidation, pleural effusion, or pneumothorax. The central airways are patent. Musculoskeletal: No acute osseous pathology. Osteopenia with degenerative changes of the spine. A focus of sclerosis involving the left posterior seventh rib at the costovertebral junction likely a bone island. CT ABDOMEN PELVIS FINDINGS No intra-abdominal free air or free fluid. Hepatobiliary: The liver is unremarkable. No intrahepatic biliary ductal dilatation. Cholecystectomy. Mildly dilated common bile duct, likely post cholecystectomy. No retained calcified stone noted in the central CBD. Pancreas: Unremarkable. No pancreatic ductal dilatation or surrounding inflammatory changes. Spleen: Normal in size without focal abnormality. Adrenals/Urinary Tract: The adrenal glands are unremarkable. There is no hydronephrosis on either side. There is symmetric enhancement and excretion of contrast by both kidneys. The visualized ureters and urinary bladder appear unremarkable. Stomach/Bowel: There is sigmoid diverticulosis without active inflammatory changes. There is no bowel obstruction or active inflammation. The appendix is not visualized with certainty. No inflammatory changes identified in the right lower  quadrant. Vascular/Lymphatic: Moderate aortoiliac atherosclerotic disease. The IVC is grossly unremarkable. No portal venous gas. There is no adenopathy. Reproductive: The uterus is anteverted. There is mild thickened appearance of the endometrium measuring approximately 7 mm in thickness. Dedicated pelvic ultrasound is recommended for better evaluation on a nonemergent/outpatient basis. No adnexal masses identified. Other: There is a 3 cm umbilical hernia with protrusion of anterior wall of a segment of colon. No obstruction Musculoskeletal: There is osteopenia with degenerative changes of the spine. Grade 1 L5-S1 retrolisthesis. No acute osseous pathology. IMPRESSION: 1. No acute/traumatic intrathoracic, abdominal, or pelvic pathology. 2. Thickened appearance of the endometrium. Further evaluation with pelvic ultrasound on a nonemergent/outpatient basis recommended. 3.  Aortic Atherosclerosis (ICD10-I70.0). Electronically Signed   By: Elgie Collard M.D.   On: 03/05/2019 17:35   DG Hip Unilat With Pelvis 2-3 Views Right  Result Date: 03/05/2019 CLINICAL DATA:  Status post fall yesterday. EXAM: DG HIP (WITH OR WITHOUT PELVIS) 2-3V RIGHT COMPARISON:  None. FINDINGS: Generalized osteopenia. No fracture or dislocation. No aggressive osseous lesion. Mild osteoarthritis bilateral  SI joints. IMPRESSION: No acute osseous injury of the right hip. Given the patient's age and osteopenia, if there is persistent clinical concern for an occult hip fracture, a MRI of the hip is recommended for increased sensitivity. Electronically Signed   By: Elige Ko   On: 03/05/2019 15:34    Assessment/Plan:    #1 history of UTI-she has completed treatment she does not complain of any dysuria at this point will monitor.  At one point her white count was elevated but this has normalized.  2.  History of rhabdomylolysis-she appears to have done well with this status post IV fluids-I have spoken with her about trying to avoid  falls at all costs and she expressed understanding.  She apparently has gained strength and has done well will need continued PT and OT with a history of weakness.  3.  History of diabetes type 2 diet controlled this appears to be stable as noted above.  4.  History of hypertension at this point appears to be stable she is on numerous medications including lisinopril 20 mg a day-Norvasc 10 mg a day and hydralazine 25 mg every 8 hours.  5.  History of restless legs she continues on Requip 0.25 mg nightly this apparently stable.  6.  History of right foot blisters she has been followed by nursing this will need follow-up by nursing as well when she is discharged.  7.  Anxiety-apparently this is fairly well controlled without medication currently will discontinue Xanax before discharge-Per nursing her use of this is quite rare.  Also would discontinue her Robaxin secondary to apparent nonuse.  She will need continued PT and OT as well as home health support-nursing when she goes home.  She also will need expedient primary care follow-up which is being arranged.  [id number] note greater than 30 minutes spent preparing this discharge summary-greater than 50% of time spent coordinating a plan of care for numerous diagnoses

## 2019-04-02 ENCOUNTER — Encounter: Payer: Self-pay | Admitting: Internal Medicine

## 2019-04-03 ENCOUNTER — Other Ambulatory Visit: Payer: Self-pay | Admitting: *Deleted

## 2019-04-03 DIAGNOSIS — I10 Essential (primary) hypertension: Secondary | ICD-10-CM

## 2019-04-03 MED ORDER — ROPINIROLE HCL 0.25 MG PO TABS: 0.2500 mg | ORAL_TABLET | Freq: Every day | ORAL | 0 refills | Status: DC

## 2019-04-03 MED ORDER — LISINOPRIL 20 MG PO TABS: 20.0000 mg | ORAL_TABLET | Freq: Every day | ORAL | 0 refills | Status: DC

## 2019-04-03 MED ORDER — AMLODIPINE BESYLATE 10 MG PO TABS: 10.0000 mg | ORAL_TABLET | Freq: Every day | ORAL | 0 refills | Status: DC

## 2019-04-03 MED ORDER — HYDRALAZINE HCL 25 MG PO TABS: 25.0000 mg | ORAL_TABLET | Freq: Three times a day (TID) | ORAL | 0 refills | Status: DC

## 2019-04-03 NOTE — Patient Outreach (Addendum)
[place] Acute Care Coordinator follow up  Kristina Wilkinson is slated for transition to home from [facility] today 04/03/19. She will have home health services. Communication sent to facility Social Worker to inquire name of home health agency.   Kristina Wilkinson previously agreed to [place] and [facility] Social Worker referrals. Please see writer's notes from 03/31/19 for further details. Kristina Wilkinson lives alone.   Kristina Wilkinson has history of DM, DVT, HTN, sepsis, falls, rhabdomyolysis.   Referrals made to [place] for complex case management. [facility] LCSW for mobile meals, transportation, and community resources and ongoing discussions regarding level of care. Member was adamant about returning home from SNF. Member indicated she is going to secure medical alert system. Member previously found down at home. High risk for falls.    [name], MSN-Ed, RN,BSN [place] Acute Care Coordinator [phone] [facility]) [phone]  (Toll free office)

## 2019-04-06 ENCOUNTER — Encounter: Payer: Self-pay | Admitting: *Deleted

## 2019-04-06 ENCOUNTER — Other Ambulatory Visit: Payer: Self-pay | Admitting: *Deleted

## 2019-04-06 DIAGNOSIS — E538 Deficiency of other specified B group vitamins: Secondary | ICD-10-CM | POA: Diagnosis not present

## 2019-04-06 DIAGNOSIS — M150 Primary generalized (osteo)arthritis: Secondary | ICD-10-CM | POA: Diagnosis not present

## 2019-04-06 DIAGNOSIS — I700 Atherosclerosis of aorta: Secondary | ICD-10-CM | POA: Diagnosis not present

## 2019-04-06 DIAGNOSIS — M4800 Spinal stenosis, site unspecified: Secondary | ICD-10-CM | POA: Diagnosis not present

## 2019-04-06 DIAGNOSIS — Z87891 Personal history of nicotine dependence: Secondary | ICD-10-CM | POA: Diagnosis not present

## 2019-04-06 DIAGNOSIS — G4700 Insomnia, unspecified: Secondary | ICD-10-CM | POA: Diagnosis not present

## 2019-04-06 DIAGNOSIS — K219 Gastro-esophageal reflux disease without esophagitis: Secondary | ICD-10-CM | POA: Diagnosis not present

## 2019-04-06 DIAGNOSIS — G2581 Restless legs syndrome: Secondary | ICD-10-CM | POA: Diagnosis not present

## 2019-04-06 DIAGNOSIS — L89612 Pressure ulcer of right heel, stage 2: Secondary | ICD-10-CM | POA: Diagnosis not present

## 2019-04-06 DIAGNOSIS — H9190 Unspecified hearing loss, unspecified ear: Secondary | ICD-10-CM | POA: Diagnosis not present

## 2019-04-06 DIAGNOSIS — Z9181 History of falling: Secondary | ICD-10-CM | POA: Diagnosis not present

## 2019-04-06 DIAGNOSIS — E1142 Type 2 diabetes mellitus with diabetic polyneuropathy: Secondary | ICD-10-CM | POA: Diagnosis not present

## 2019-04-06 DIAGNOSIS — M25512 Pain in left shoulder: Secondary | ICD-10-CM | POA: Diagnosis not present

## 2019-04-06 DIAGNOSIS — I10 Essential (primary) hypertension: Secondary | ICD-10-CM | POA: Diagnosis not present

## 2019-04-06 DIAGNOSIS — Z86718 Personal history of other venous thrombosis and embolism: Secondary | ICD-10-CM | POA: Diagnosis not present

## 2019-04-06 DIAGNOSIS — G8929 Other chronic pain: Secondary | ICD-10-CM | POA: Diagnosis not present

## 2019-04-06 NOTE — Patient Outreach (Signed)
[place] [facility]) Care Management  04/06/2019  Kristina Wilkinson 1927/10/12 [id number]   Referral received from [place] coordinator as member was discharged from [facility] on 2/19, was admitted to hospital for fall, rhabdomyolysis, and UTI.  Also received red EMMI alert for not reading discharge papers.  Per chart, she also has history of hypertension, GERD, diabetes (A1C 5.8), osteoarthritis, and spinal stenosis.  Call placed to member for transition of care, identity verified.  This care manager introduced self and stated purpose of call.  [facility] care management services explained.    Member lives alone but has multiple family members and friends that are available at times but she is aware that she does not have the support needed to care for herself.  She state she is not able to get back and forth in the kitchen to prepare her meals nor is she able to get up/down in her bed, sleeping in her recliner.  She is using a walker but state she is not strong enough, working with PT and had first visit today.  Referral to CSW has already been placed.  She is aware that she is in need of life alert, state [facility] is helping to get this set up.  She report she is able to take her medications independently, not taking many (total of 4 meds), denies needing financial assistance.  She has not had appointment with PCP office since being discharged from hospital, call placed to office to schedule visit.  State she monitors her blood pressure and temperature daily, report stable.  Was [id number] systolic today, can't remember the diastolic.    Denies any urgent concerns at this time, will follow up next week.  Fall Risk  04/06/2019 09/11/2018 04/24/2017 12/12/2016 10/14/2015  Falls in the past year? 1 (No Data) Yes Yes No  Comment - [place] Telephone Survey: data to providers prior to load fell after losing balance; skull fracture led to hospitalization - [facility] Telephone Survey: data to providers prior to load   Number falls in past yr: 1 (No Data) 1 1 -  Comment - [place] Telephone Survey Actual Response =  - - -  Injury with Fall? 0 - Yes Yes -  Risk for fall due to : History of fall(s) - Impaired balance/gait;Impaired mobility - -  Follow up Falls prevention discussed - - - -   Depression screen [facility] 2/9 04/06/2019 04/24/2017 12/12/2016 07/06/2014 04/20/2014  Decreased Interest 0 0 0 0 0  Down, Depressed, Hopeless 2 0 0 0 0  PHQ - 2 Score 2 0 0 0 0  Altered sleeping 1 0 - - -  Tired, decreased energy 1 0 - - -  Change in appetite 1 0 - - -  Feeling bad or failure about yourself  0 0 - - -  Trouble concentrating - 0 - - -  Moving slowly or fidgety/restless 0 0 - - -  Suicidal thoughts 0 0 - - -  PHQ-9 Score 5 0 - - -  Difficult doing work/chores Not difficult at all Not difficult at all - - [facility] CM Care Plan Problem One     Most Recent Value  Care Plan Problem One  Risk for readmission related to increased fall risk as evidenced by recent hospitalization  Role Documenting the Problem One  Care Management Coordinator  Care Plan for Problem One  Active  [facility] Long Term Goal   Member will not be readmitted to acute care facility  within the next 31 days  [facility] Long Term Goal Start Date  04/06/19  Interventions for Problem One Long Term Goal  Discharge instructions reviewed with member.  Medications reviewed, discussed the need for additional support in the home, verified CSW was assigned to case.  Will collaborate regarding increased support  [facility] CM Short Term Goal #1   Member will report no falls within the next 4 weeks  [facility] CM Short Term Goal #1 Start Date  04/06/19  Interventions for Short Term Goal #1  Member educated on use of DME to decrease risk of falls.  Discussed participation with [facility] for PT/OT to increase strength and decrease fall risk.  [facility] CM Short Term Goal #2   Member wil have follow up with PCP witihn the next 2 weeks  [facility] CM Short Term Goal #2 Start Date  04/06/19  Interventions for  Short Term Goal #2  Call placed to PCP office to schedule follow up appointment.     [name], [place], MSN [place] [phone]

## 2019-04-06 NOTE — Patient Outreach (Signed)
[place] [facility]) Care Management  04/06/2019  TACEY EAKES 1927-08-05 [id number]   CSW was able to make initial outreach contact with pt today. CSW introduced self and pt confirmed her identity.  CSW advised pt reason for call; recent hospitalization and SNF stay.  Per pt, she lives alone and has some people who help her with housecleaning and other things.  "I still drive" and thinks the "Advanced services coming out today are gonna help me too".  Pt was reminded several times that [facility] and the home health agency are not an overlap of services and that we can also offer assistance and support.  " I want all the free services I can get". Pt also reports she has one son in [place] and does not see him often; she denies having a HCPOA- also tried to inquire about any emergency contact friends or family but she did not offer any.   Pt is HOH and ultimately wants to see what Advanced services is going to do".  CSW will plan to call pt again on tomorrow for update on home health visit and their services and plans.   CSW will also update [facility] team to above and collaborate for our support and services.   [name], MSW, [place] Worker  [place] [phone]'

## 2019-04-07 ENCOUNTER — Telehealth: Payer: Self-pay | Admitting: *Deleted

## 2019-04-07 ENCOUNTER — Ambulatory Visit: Payer: Self-pay | Admitting: *Deleted

## 2019-04-07 DIAGNOSIS — M25512 Pain in left shoulder: Secondary | ICD-10-CM | POA: Diagnosis not present

## 2019-04-07 DIAGNOSIS — M4800 Spinal stenosis, site unspecified: Secondary | ICD-10-CM | POA: Diagnosis not present

## 2019-04-07 DIAGNOSIS — E1142 Type 2 diabetes mellitus with diabetic polyneuropathy: Secondary | ICD-10-CM | POA: Diagnosis not present

## 2019-04-07 DIAGNOSIS — L89612 Pressure ulcer of right heel, stage 2: Secondary | ICD-10-CM | POA: Diagnosis not present

## 2019-04-07 DIAGNOSIS — M150 Primary generalized (osteo)arthritis: Secondary | ICD-10-CM | POA: Diagnosis not present

## 2019-04-07 DIAGNOSIS — G8929 Other chronic pain: Secondary | ICD-10-CM | POA: Diagnosis not present

## 2019-04-07 NOTE — Telephone Encounter (Signed)
Amy nurse with [facility] called wanting nursing orders and wound care orders from patient's PCP. Amy stated that she does not think that PCP listed is here at this office and wanted to confirm that. Amy stated that patient told her that she sees [place]. Advised Amy that patient is scheduled to see Tor Netters NP for a visit on 04/15/19. Advised Amy that patient has not been here in way over a year. Amy stated that patient was sent home from rehab because her insurance ran out. Amy stated that patient should have not been sent home to be alone.  Amy stated that patient is scheduled for a virtual visit, but she is not sure that patient is capable to doing a visit like this.  Moved patient's appointment up to this Friday 04/10/19 with Tor Netters NP.  Amy stated that she will go out and help patient do the virtual visit since she is due to see her back that day. Advised Amy that message will go to Tor Netters NP so she will be aware of what is going on with patient. Amy stated that she will continue to follow patient and will work with Jackelyn Poling to get orders signed Friday after the visit.

## 2019-04-08 ENCOUNTER — Telehealth: Payer: Self-pay | Admitting: *Deleted

## 2019-04-08 ENCOUNTER — Other Ambulatory Visit: Payer: Self-pay | Admitting: *Deleted

## 2019-04-08 ENCOUNTER — Ambulatory Visit: Payer: Self-pay | Admitting: *Deleted

## 2019-04-08 DIAGNOSIS — G8929 Other chronic pain: Secondary | ICD-10-CM | POA: Diagnosis not present

## 2019-04-08 DIAGNOSIS — L89612 Pressure ulcer of right heel, stage 2: Secondary | ICD-10-CM | POA: Diagnosis not present

## 2019-04-08 DIAGNOSIS — M150 Primary generalized (osteo)arthritis: Secondary | ICD-10-CM | POA: Diagnosis not present

## 2019-04-08 DIAGNOSIS — E1142 Type 2 diabetes mellitus with diabetic polyneuropathy: Secondary | ICD-10-CM | POA: Diagnosis not present

## 2019-04-08 DIAGNOSIS — M25512 Pain in left shoulder: Secondary | ICD-10-CM | POA: Diagnosis not present

## 2019-04-08 DIAGNOSIS — M4800 Spinal stenosis, site unspecified: Secondary | ICD-10-CM | POA: Diagnosis not present

## 2019-04-08 NOTE — Patient Outreach (Signed)
[facility] [facility]) Care Management  04/08/2019  Kristina Wilkinson 1928-02-03 [id number]   CSW attempted to reach pt by phone today. CSW was unable to reach pt but was able to leave a HIPPA compliant message. CSW will outreach pt again in 3-4 business days if no return call is received before then.   [name], MSW, [place] Worker  [place] [phone]

## 2019-04-08 NOTE — Telephone Encounter (Signed)
Erlene Quan PT with [facility] left a voicemail requesting verbal orders for OT for once a week for 6 weeks. Okay to leave message on voicemail for him.

## 2019-04-08 NOTE — Telephone Encounter (Signed)
Called and spoke to [place]. Am unable to provide verbal orders for home PT since I have not seen patient in over a year. She has a virtual visit with me on 04/10/19 and once visit is completed, I will call him with verbal orders.

## 2019-04-09 DIAGNOSIS — E1142 Type 2 diabetes mellitus with diabetic polyneuropathy: Secondary | ICD-10-CM | POA: Diagnosis not present

## 2019-04-09 DIAGNOSIS — M25512 Pain in left shoulder: Secondary | ICD-10-CM | POA: Diagnosis not present

## 2019-04-09 DIAGNOSIS — M150 Primary generalized (osteo)arthritis: Secondary | ICD-10-CM | POA: Diagnosis not present

## 2019-04-09 DIAGNOSIS — G8929 Other chronic pain: Secondary | ICD-10-CM | POA: Diagnosis not present

## 2019-04-09 DIAGNOSIS — M4800 Spinal stenosis, site unspecified: Secondary | ICD-10-CM | POA: Diagnosis not present

## 2019-04-09 DIAGNOSIS — L89612 Pressure ulcer of right heel, stage 2: Secondary | ICD-10-CM | POA: Diagnosis not present

## 2019-04-10 ENCOUNTER — Ambulatory Visit (INDEPENDENT_AMBULATORY_CARE_PROVIDER_SITE_OTHER): Payer: Medicare Other | Admitting: Family Medicine

## 2019-04-10 ENCOUNTER — Encounter: Payer: Self-pay | Admitting: Family Medicine

## 2019-04-10 ENCOUNTER — Other Ambulatory Visit: Payer: Self-pay

## 2019-04-10 ENCOUNTER — Telehealth: Payer: Self-pay

## 2019-04-10 VITALS — BP 160/60 | HR 72 | Temp 96.8°F | Resp 16

## 2019-04-10 DIAGNOSIS — R2243 Localized swelling, mass and lump, lower limb, bilateral: Secondary | ICD-10-CM | POA: Diagnosis not present

## 2019-04-10 DIAGNOSIS — R944 Abnormal results of kidney function studies: Secondary | ICD-10-CM | POA: Diagnosis not present

## 2019-04-10 DIAGNOSIS — M8949 Other hypertrophic osteoarthropathy, multiple sites: Secondary | ICD-10-CM

## 2019-04-10 DIAGNOSIS — R600 Localized edema: Secondary | ICD-10-CM | POA: Diagnosis not present

## 2019-04-10 DIAGNOSIS — Y92009 Unspecified place in unspecified non-institutional (private) residence as the place of occurrence of the external cause: Secondary | ICD-10-CM | POA: Diagnosis not present

## 2019-04-10 DIAGNOSIS — W19XXXS Unspecified fall, sequela: Secondary | ICD-10-CM | POA: Diagnosis not present

## 2019-04-10 DIAGNOSIS — G6289 Other specified polyneuropathies: Secondary | ICD-10-CM | POA: Diagnosis not present

## 2019-04-10 DIAGNOSIS — L89612 Pressure ulcer of right heel, stage 2: Secondary | ICD-10-CM | POA: Diagnosis not present

## 2019-04-10 DIAGNOSIS — I10 Essential (primary) hypertension: Secondary | ICD-10-CM | POA: Diagnosis not present

## 2019-04-10 DIAGNOSIS — M150 Primary generalized (osteo)arthritis: Secondary | ICD-10-CM | POA: Diagnosis not present

## 2019-04-10 DIAGNOSIS — R531 Weakness: Secondary | ICD-10-CM

## 2019-04-10 DIAGNOSIS — S91301A Unspecified open wound, right foot, initial encounter: Secondary | ICD-10-CM

## 2019-04-10 DIAGNOSIS — R2681 Unsteadiness on feet: Secondary | ICD-10-CM | POA: Diagnosis not present

## 2019-04-10 DIAGNOSIS — M159 Polyosteoarthritis, unspecified: Secondary | ICD-10-CM

## 2019-04-10 DIAGNOSIS — E139 Other specified diabetes mellitus without complications: Secondary | ICD-10-CM | POA: Diagnosis not present

## 2019-04-10 DIAGNOSIS — M4800 Spinal stenosis, site unspecified: Secondary | ICD-10-CM | POA: Diagnosis not present

## 2019-04-10 DIAGNOSIS — M25512 Pain in left shoulder: Secondary | ICD-10-CM | POA: Diagnosis not present

## 2019-04-10 DIAGNOSIS — E1142 Type 2 diabetes mellitus with diabetic polyneuropathy: Secondary | ICD-10-CM | POA: Diagnosis not present

## 2019-04-10 DIAGNOSIS — G8929 Other chronic pain: Secondary | ICD-10-CM | POA: Diagnosis not present

## 2019-04-10 LAB — BASIC METABOLIC PANEL
BUN: 22 — AB (ref 4–21)
CO2: 25 — AB (ref 13–22)
Chloride: 112 — AB (ref 99–108)
Creatinine: 0.7 (ref 0.5–1.1)
Glucose: 162
Potassium: 4.2 (ref 3.4–5.3)
Sodium: 147 (ref 137–147)

## 2019-04-10 LAB — COMPREHENSIVE METABOLIC PANEL
Calcium: 9.8 (ref 8.7–10.7)
GFR calc Af Amer: 86
GFR calc non Af Amer: 74

## 2019-04-10 LAB — CBC AND DIFFERENTIAL
HCT: 33 — AB (ref 36–46)
Hemoglobin: 10.9 — AB (ref 12.0–16.0)
Platelets: 171 (ref 150–399)
WBC: 7.2

## 2019-04-10 LAB — CBC: RBC: 3.65 — AB (ref 3.87–5.11)

## 2019-04-10 NOTE — Progress Notes (Signed)
Virtual Visit via Video Note  I connected with [place] on 04/10/19 at  2:00 PM EST by a video enabled telemedicine application and verified that I am speaking with the correct person using two identifiers.  Location: Patient: In her home Provider: [place] Persons participating in call: [name] (home health nurse at patient's home), Saralyn Pilar (nurse practitioner student), patient and provider.    I discussed the limitations of evaluation and management by telemedicine and the availability of in person appointments. The patient expressed understanding and agreed to proceed.  History of Present Illness: Chief Complaint  Patient presents with  . Follow-up    from rehab-also need to discuss verbal orders for Hospital bed, Nursing, PT, OT, bathe aid and social worker for patient  This is an 86 yo female who presents today for virtual visit for follow up of recent skilled nursing facility stay. She fell at home 03/05/19 and was admitted to the hospital. She was discharged to a skilled nursing facility and was discharged home when her days ran out. She has been followed by home health. Per the patient and the [facility] nurse, the patient was discharged home where she lives by herself and has had PT start. She continues to feel weak, is unable to get in and out of her bed. Has been sleeping in her recliner. The patient reports that she feels better but continues to be weak.   HTN- per [facility] nurse, blood pressures running 1365-160/60-76. Patient was discharged home on 3 agents for blood pressure. She is only taking hydralazine 25 mg once a day (ordered for 3x/ day). She is not taking amlodipine or lisinopril. Just doesn't want to take much medication.   Patient has friends/ family occasionally check in on her. She has cleaning help once a month. She has had hired help in past but person not currently available. Has someone get groceries occasionally. Per [facility] nurse, patient with little food in the house,  unable to prepare her own meals. She was seen by LCSW earlier this week who is working to get her a Investment banker, corporate call button, Meals on Wheels and help patient look into Medicaid. Per [facility] Nurse, she has requested services for PT/ OT/ bath aid and feels that she would benefit from hospital bed.   Right heel with 3.5 cm area breakdown. [place] nurse applying silver alginate.    Observations/Objective: Patient is alert and answers questions appropriately. Hard of hearing and has to have nurse repeat questions. Respirations even and unlabored, no increased work of breathing with patient sitting. No audible wheeze or witnessed cough. Mood and affect appropriate.  BP (!) 160/60 (BP Location: Left Arm)   Pulse 72   Temp (!) 96.8 F (36 C)   Resp 16   SpO2 96%  Wt Readings from Last 3 Encounters:  04/01/19 163 lb 6.4 oz (74.1 kg)  03/13/19 157 lb (71.2 kg)  03/11/19 182 lb 15.7 oz (83 kg)   BP Readings from Last 3 Encounters:  04/10/19 (!) 160/60  04/01/19 130/65  03/13/19 136/67    Assessment and Plan: 1. Other polyneuropathy - For home use only DME Hospital bed  2. Unstable gait - continue home PT - For home use only DME Hospital bed  3. Fall in home, sequela - home PT - For home use only DME Hospital bed  4. Open wound of right heel, initial encounter - continue skilled nursing visits 3x/ week, notify me if worsening, consider tx for cellulitis -  patient requires frequent repositioning to prevent further skin breakdown. This can not be achieved in regular bed and patient is unable to get in and out of standard bed.  - For home use only DME Hospital bed  5. Primary osteoarthritis involving multiple joints - can continue topical pain relievers, per patient, she can not take otc anlagesics - For home use only DME Hospital bed  6. Weakness - home PT/ OT - For home use only DME Hospital bed  7. Essential hypertension - patient reluctant to take multiple medication. She agreed  to take lisinopril daily instead of hydralazine. HH to continue to monitor blood pressure  8. Bilateral lower extremity edema - will benefit from elevating feet to heart level in hospital bed - [place] will draw CBC, BMET  - follow up in 2 months in the office if at all possible   Clarene Reamer, FNP-BC  [place] at [facility] [place]  04/10/2019 5:37 PM   Follow Up Instructions:    I discussed the assessment and treatment plan with the patient. The patient was provided an opportunity to ask questions and all were answered. The patient agreed with the plan and demonstrated an understanding of the instructions.   The patient was advised to call back or seek an in-person evaluation if the symptoms worsen or if the condition fails to improve as anticipated.  Wilfred Lacy, FNP

## 2019-04-10 NOTE — Telephone Encounter (Signed)
Kristina Wilkinson with [facility] contacted the office. She states after the patient's virtual visit today, she sat down with patient and listened to her heartbeat for approx 3-5 minutes. She states she did notice that her heartbeat is very irregular. She is wondering if Jackelyn Poling thinks a cardiology referral would be necessary, or maybe setting her up an in home EKG? Please advise.

## 2019-04-13 ENCOUNTER — Other Ambulatory Visit: Payer: Self-pay | Admitting: Family Medicine

## 2019-04-13 ENCOUNTER — Encounter: Payer: Self-pay | Admitting: Family Medicine

## 2019-04-13 ENCOUNTER — Telehealth: Payer: Self-pay | Admitting: Family Medicine

## 2019-04-13 ENCOUNTER — Other Ambulatory Visit: Payer: Self-pay | Admitting: *Deleted

## 2019-04-13 ENCOUNTER — Telehealth: Payer: Self-pay | Admitting: *Deleted

## 2019-04-13 DIAGNOSIS — G8929 Other chronic pain: Secondary | ICD-10-CM | POA: Diagnosis not present

## 2019-04-13 DIAGNOSIS — M4800 Spinal stenosis, site unspecified: Secondary | ICD-10-CM | POA: Diagnosis not present

## 2019-04-13 DIAGNOSIS — L89612 Pressure ulcer of right heel, stage 2: Secondary | ICD-10-CM | POA: Diagnosis not present

## 2019-04-13 DIAGNOSIS — M150 Primary generalized (osteo)arthritis: Secondary | ICD-10-CM | POA: Diagnosis not present

## 2019-04-13 DIAGNOSIS — L03116 Cellulitis of left lower limb: Secondary | ICD-10-CM

## 2019-04-13 DIAGNOSIS — M25512 Pain in left shoulder: Secondary | ICD-10-CM | POA: Diagnosis not present

## 2019-04-13 DIAGNOSIS — E1142 Type 2 diabetes mellitus with diabetic polyneuropathy: Secondary | ICD-10-CM | POA: Diagnosis not present

## 2019-04-13 MED ORDER — SULFAMETHOXAZOLE-TRIMETHOPRIM 800-160 MG PO TABS: 1.0000 | ORAL_TABLET | Freq: Two times a day (BID) | ORAL | 0 refills | Status: DC

## 2019-04-13 NOTE — Telephone Encounter (Signed)
Patient had a virtual visit with Tor Netters NP 04/10/19.

## 2019-04-13 NOTE — Telephone Encounter (Signed)
Appointment canceled per debbie g pt aware appointment will be canceled

## 2019-04-13 NOTE — Telephone Encounter (Signed)
Amy nurse with [facility] left a voicemail stating that she was out to see the patient today and her wound itself looks better, but is redder and warm to the touch. Amy feels that the patient needs an antibiotic now for the wound. Amy stated that she is still noticing an irregular heart rhythm today. Amy stated that the patient told her she took the Lisinopril over the weekend and it made her feel horrible. Patient told her she could not hear or see and was real weak. Amy stated that the patient took Hydralazine today and blood pressure was 150/60. Amy stated the patient told her while she was in the facility she was given Hydralazine and Amlodipine and felt fine taking those two. Amy wants to know what she should do about her blood pressure medication? [place]

## 2019-04-13 NOTE — Patient Outreach (Addendum)
[facility] [facility]) Care Management  04/13/2019  AMBERLI SALT 21-Aug-1927 [id number]   Weekly transition of care call placed to member's listed home number rings busy multiple times.  Call then placed to listed cell number, recording state member is unable to receive call at this time, unable to leave a message.  Call placed to [name], RN with [facility] to follow up on member's progress, message left.  Will await call back from [name], will follow up with member within the next 3-4 business days.   Update:  Call received back from [name], state she was in the home this morning for assessment.  Report there is continued concern for member to be able to adequately care for herself in the home however member remains adamant about staying in the home.  They are working on getting member a life alert system as well as obtaining a private pay CNA to come into the home.  [facility] is providing member with nursing, PT, OT, and bath aide for the next several weeks but member will still need long term plan.  [name] report that the PT was also unable to get in contact with member today.  She will try to contact member and will go by her home if unable to reach her as there is concern that member could have fallen again and unable to get to the phone.  This care manager will await update from [name] but will also follow up with member later this week as planned.  [name], [place], MSN [place] [phone]

## 2019-04-13 NOTE — Telephone Encounter (Signed)
Called and spoke with [facility] nurse. Will have patient take amlodipine and hydralazine, hold lisinopril. Sulfamethoxazole/ trimethoprim sent to patient's pharmacy. OK'd verbal order for mobil EKG.

## 2019-04-13 NOTE — Telephone Encounter (Signed)
Left message asking pt to call office  Elby Beck, FNP  Ramond Craver B   Please cancel 3/3 visit. She needs visit in 2 months. Can try to schedule now. Hopefully, will be strong enough to come into office.  Thanks,  [employer]

## 2019-04-13 NOTE — Telephone Encounter (Signed)
Had virtual visit with patient Friday, 04/10/19. HH RN, [name], said she would take care of verbal orders.

## 2019-04-13 NOTE — Telephone Encounter (Signed)
Noted see, message 04/13/19.

## 2019-04-14 ENCOUNTER — Other Ambulatory Visit: Payer: Self-pay | Admitting: *Deleted

## 2019-04-14 ENCOUNTER — Ambulatory Visit: Payer: Self-pay | Admitting: *Deleted

## 2019-04-14 NOTE — Patient Outreach (Signed)
[place] [facility]) Care Management  04/14/2019  LAKETA CANGEMI 12-24-1927 [id number]   CSW attempted to reach pt today but she was unable to hear me :[facility] and said her phone was acting up.  CSW attempted to speak as loud as possible but not successful.  CSW will try again later in week.   [name], MSW, [place] Worker  [place] [phone]

## 2019-04-15 ENCOUNTER — Other Ambulatory Visit: Payer: Self-pay | Admitting: Family Medicine

## 2019-04-15 ENCOUNTER — Telehealth: Payer: Self-pay | Admitting: Family Medicine

## 2019-04-15 ENCOUNTER — Ambulatory Visit: Payer: Medicare Other | Admitting: Family Medicine

## 2019-04-15 DIAGNOSIS — E1142 Type 2 diabetes mellitus with diabetic polyneuropathy: Secondary | ICD-10-CM | POA: Diagnosis not present

## 2019-04-15 DIAGNOSIS — G8929 Other chronic pain: Secondary | ICD-10-CM | POA: Diagnosis not present

## 2019-04-15 DIAGNOSIS — L03116 Cellulitis of left lower limb: Secondary | ICD-10-CM

## 2019-04-15 DIAGNOSIS — L89612 Pressure ulcer of right heel, stage 2: Secondary | ICD-10-CM | POA: Diagnosis not present

## 2019-04-15 DIAGNOSIS — M150 Primary generalized (osteo)arthritis: Secondary | ICD-10-CM | POA: Diagnosis not present

## 2019-04-15 DIAGNOSIS — M25512 Pain in left shoulder: Secondary | ICD-10-CM | POA: Diagnosis not present

## 2019-04-15 DIAGNOSIS — G2581 Restless legs syndrome: Secondary | ICD-10-CM

## 2019-04-15 DIAGNOSIS — M4800 Spinal stenosis, site unspecified: Secondary | ICD-10-CM | POA: Diagnosis not present

## 2019-04-15 MED ORDER — ROPINIROLE HCL 0.25 MG PO TABS: 0.2500 mg | ORAL_TABLET | Freq: Every day | ORAL | 5 refills | Status: DC

## 2019-04-15 MED ORDER — SULFAMETHOXAZOLE-TRIMETHOPRIM 800-160 MG PO TABS: 1.0000 | ORAL_TABLET | Freq: Two times a day (BID) | ORAL | 0 refills | Status: AC

## 2019-04-15 NOTE — Telephone Encounter (Signed)
Wille Celeste, PA-C wrote the Rx for Ropinirole 0.25mg  on 04/03/19  [facility] is needing this sent in. Please advise, thanks.

## 2019-04-15 NOTE — Telephone Encounter (Signed)
Kristina Wilkinson, [facility], called.  A prescription for Bactrim was called in to [facility].  Patient now uses [facility].  Please transfer rx to [facility].

## 2019-04-15 NOTE — Telephone Encounter (Signed)
Please call home health nurse and tell her that I have sent in antibiotic to [facility]. I do not think patient needs to be checking her blood sugars at home, can she get a hemoglobin A1c? Continue to monitor blood pressures. Can she elaborate on side effects of amlodipine?

## 2019-04-15 NOTE — Telephone Encounter (Signed)
Amy, nurse with [facility], l/m stating that patient is now using [facility] as mentioned below and need antibiotic re sent to them. [facility] delivers. I updated pharmacy in the chart.  Amy wanted to check with Jackelyn Poling, based on the lab work done last week (hoepfully we received those results) does patient need to get glucometer to check sugars?  Patient took Amlodipine and Hydralazine yesterday and felt terrible, so patient is refusing to take Amlodipine today and will continue taking Hydralazine 2 times daily. Amy and P.T. services will keep an eye on patient's b/p and increase Hydralazine to 3 times daily as needed and will keep [place] posted about this. Any other feedback about this? Amy's CB is [phone]

## 2019-04-15 NOTE — Telephone Encounter (Signed)
Lauren called and said she received the rx for Bactrim.  Lauren said she's waiting on the prescription for Ropinirole .25 mg.

## 2019-04-15 NOTE — Telephone Encounter (Signed)
Please call [facility] nurse per original message. Also, let her know that I sent in refill of ropinirole.

## 2019-04-16 ENCOUNTER — Other Ambulatory Visit: Payer: Self-pay | Admitting: *Deleted

## 2019-04-16 DIAGNOSIS — L89612 Pressure ulcer of right heel, stage 2: Secondary | ICD-10-CM | POA: Diagnosis not present

## 2019-04-16 DIAGNOSIS — M4800 Spinal stenosis, site unspecified: Secondary | ICD-10-CM | POA: Diagnosis not present

## 2019-04-16 DIAGNOSIS — E1142 Type 2 diabetes mellitus with diabetic polyneuropathy: Secondary | ICD-10-CM | POA: Diagnosis not present

## 2019-04-16 DIAGNOSIS — M150 Primary generalized (osteo)arthritis: Secondary | ICD-10-CM | POA: Diagnosis not present

## 2019-04-16 DIAGNOSIS — M25512 Pain in left shoulder: Secondary | ICD-10-CM | POA: Diagnosis not present

## 2019-04-16 DIAGNOSIS — G8929 Other chronic pain: Secondary | ICD-10-CM | POA: Diagnosis not present

## 2019-04-16 DIAGNOSIS — I10 Essential (primary) hypertension: Secondary | ICD-10-CM | POA: Diagnosis not present

## 2019-04-17 ENCOUNTER — Other Ambulatory Visit: Payer: Self-pay | Admitting: *Deleted

## 2019-04-17 ENCOUNTER — Telehealth: Payer: Self-pay

## 2019-04-17 DIAGNOSIS — M150 Primary generalized (osteo)arthritis: Secondary | ICD-10-CM | POA: Diagnosis not present

## 2019-04-17 DIAGNOSIS — M4800 Spinal stenosis, site unspecified: Secondary | ICD-10-CM | POA: Diagnosis not present

## 2019-04-17 DIAGNOSIS — L89612 Pressure ulcer of right heel, stage 2: Secondary | ICD-10-CM | POA: Diagnosis not present

## 2019-04-17 DIAGNOSIS — M25512 Pain in left shoulder: Secondary | ICD-10-CM | POA: Diagnosis not present

## 2019-04-17 DIAGNOSIS — E1142 Type 2 diabetes mellitus with diabetic polyneuropathy: Secondary | ICD-10-CM | POA: Diagnosis not present

## 2019-04-17 DIAGNOSIS — G8929 Other chronic pain: Secondary | ICD-10-CM | POA: Diagnosis not present

## 2019-04-17 NOTE — Patient Outreach (Signed)
[name]) Care Management  04/17/2019  Kristina Wilkinson 06/27/1927 [id number]   CSW made contact with pt again on 04/16/2019.  Pt was able to hear better however still was a challenging phone conversation.  Pt had difficulty understanding why I was calling and continued to ask this throughout the attempt to screen pt for needs.  CSW advised pt her PCP had asked that we reach out to her regarding concerns related to her living alone, financial concerns, etc.  CSW was able to ask some questions to which pt replied; she lives alone, still drives, has her meds delivered and has someone helping her get her groceries.  When CSW attempted to assess for financial concerns pt shared that her RX are covered (no copay) and she is able to pay bills, buy groceries, etc.  Pt did not want to disclose any more info related to her finances and was inquisitive about who I was and why I was calling. Pt at times seemed to become agitated and thus CSW ended call and will discuss further with PCP and [facility] team.  [name], MSW, [place] Worker  [place] [phone]

## 2019-04-17 NOTE — Telephone Encounter (Signed)
Called and spoke with Amy. Discussed blood sugar and patient with normal hgba1c 03/06/19. Will continue to monitor blood sugars, suspect related to carb heavy meal. Patient is still sleeping in recliner, awaiting hospital bed. Not very mobile. Recently got [employer]. PT continues to work with her. Unlikely DVT with bilateral leg swelling, suspect related to vascular insufficiency, unable to elevate legs fully, immobility.

## 2019-04-17 NOTE — Telephone Encounter (Signed)
Spoke with Amy, she states that she spoke with Jackelyn Poling and states that the patient NOT going to be taking the Ropinirole.

## 2019-04-17 NOTE — Telephone Encounter (Signed)
Amy nurse with [facility] said she is at pts home. Amy found pts diabetic meter; pt has not eaten since 6AM and BS now is 179. Pt is tired all the time and pt falls asleep after eating. Pt is on abx for leg infection. Pt is not on med for diabetes. In Dec and Jan pts BS ran [phone] and 113. Amy wonders what Tor Netters FNP might think is causing BS elevation; could it be the leg infection or pt does drink ensure also. Amy wants to verify Glenda Chroman FNP received lab results from last wk. Pt had EKG in home on 04/16/19. Amy also said prior to fall pt did not have problem with legs swelling; now legs stay swollen all the time. Swelling does not go down overnight. Both legs are swollen tight, pain in both legs,  There is redness around wound on rt leg but not left leg and rt leg feels slightly warm at site of wound. No CP and no SOB.Amy wonders about DVT and wanted Glenda Chroman FNP to know that they can ck for DVT in the home. Amy request cb.

## 2019-04-17 NOTE — Patient Outreach (Signed)
[facility]) Care Management  04/17/2019  Kristina Wilkinson May 10, 1927 [id number]   Outreach attempt #2, successful.  Weekly transition of care call placed to member.  Conversation difficult due to member's hearing, however able to complete outreach and initial assessment components.  She state she is doing better with her walking, aided with walker but admit that she still isn't able to be completely independent in ADL's yet.  She does confirm she received her newly prescribed antibiotics yesterday but was unable to open them, will wait for visit from [facility] PT today and have them open the bottle for her.  She remains interested in options to increase support in her home rather than seek placement at ALF.  She continues to fix "whatever meals I can" as she is unable to safely cook.  TH[facility] CSW has been in contact with member however assistance has been hard to coordinate due to member's inability to hear over the phone.  Collaborated with CSW, she will contact [name] nurse [name] in attempt to speak with member during her home visit.    Due to member's inability to get into the MD office for face to face visit, discussed the option of having a provider come into the home for in person assessment, she agrees.  Will place referral to [facility] for in home evaluation and suggestions for interventions.  Member denies any urgent concerns at this time, she agrees to follow up within the next week.  [facility] CM Care Plan Problem One     Most Recent Value  Care Plan Problem One  Risk for readmission related to increased fall risk as evidenced by recent hospitalization  Role Documenting the Problem One  Care Management [place] for Problem One  Active  [facility] Long Term Goal   Member will not be readmitted to acute care facility within the next 31 days  THN Long Term Goal Start Date  04/06/19  Interventions for Problem One Long Term Goal  Reviewed plan of care with member, educated on  importance of following and taking medications as instructed.  Referral placed to [facility] in effort to have more face to face assessment by provider.  THN CM Short Term Goal #1   Member will report no falls within the next 4 weeks  THN CM Short Term Goal #1 Start Date  04/06/19  Interventions for Short Term Goal #1  Collaborated with [facility] to confirm PT/OT has started.  Collaborated with CSW and [facility] to increase support in the home to decrease risk of falling.  THN CM Short Term Goal #2   Member wil have follow up with PCP witihn the next 2 weeks  THN CM Short Term Goal #2 Start Date  04/06/19  [facility] CM Short Term Goal #2 Met Date  04/17/19     Kristina David, RN, MSN [place] Manager [phone]

## 2019-04-20 ENCOUNTER — Ambulatory Visit: Payer: Self-pay | Admitting: *Deleted

## 2019-04-20 ENCOUNTER — Other Ambulatory Visit: Payer: Self-pay | Admitting: *Deleted

## 2019-04-20 DIAGNOSIS — L89612 Pressure ulcer of right heel, stage 2: Secondary | ICD-10-CM | POA: Diagnosis not present

## 2019-04-20 DIAGNOSIS — E1142 Type 2 diabetes mellitus with diabetic polyneuropathy: Secondary | ICD-10-CM | POA: Diagnosis not present

## 2019-04-20 DIAGNOSIS — M25512 Pain in left shoulder: Secondary | ICD-10-CM | POA: Diagnosis not present

## 2019-04-20 DIAGNOSIS — M150 Primary generalized (osteo)arthritis: Secondary | ICD-10-CM | POA: Diagnosis not present

## 2019-04-20 DIAGNOSIS — G8929 Other chronic pain: Secondary | ICD-10-CM | POA: Diagnosis not present

## 2019-04-20 DIAGNOSIS — M4800 Spinal stenosis, site unspecified: Secondary | ICD-10-CM | POA: Diagnosis not present

## 2019-04-20 NOTE — Patient Outreach (Signed)
[facility]) Care Management  04/20/2019  CURTISHA WRICE [birth year] [id number]  CSW attempted to make contact with pt but the number was giving an error message.  CSW also attempted to make contact with pt's [facility] and left a HIPPA compliant  message for a call back.  CSW will outreach pt again in 3-4 business days if no return call is received.   [name], MSW, [place] Worker  [place] [phone]

## 2019-04-21 ENCOUNTER — Telehealth: Payer: Self-pay

## 2019-04-21 ENCOUNTER — Other Ambulatory Visit: Payer: Self-pay | Admitting: *Deleted

## 2019-04-21 DIAGNOSIS — G8929 Other chronic pain: Secondary | ICD-10-CM | POA: Diagnosis not present

## 2019-04-21 DIAGNOSIS — M4800 Spinal stenosis, site unspecified: Secondary | ICD-10-CM | POA: Diagnosis not present

## 2019-04-21 DIAGNOSIS — M25512 Pain in left shoulder: Secondary | ICD-10-CM | POA: Diagnosis not present

## 2019-04-21 DIAGNOSIS — M150 Primary generalized (osteo)arthritis: Secondary | ICD-10-CM | POA: Diagnosis not present

## 2019-04-21 DIAGNOSIS — E1142 Type 2 diabetes mellitus with diabetic polyneuropathy: Secondary | ICD-10-CM | POA: Diagnosis not present

## 2019-04-21 DIAGNOSIS — L89612 Pressure ulcer of right heel, stage 2: Secondary | ICD-10-CM | POA: Diagnosis not present

## 2019-04-21 NOTE — Telephone Encounter (Signed)
Kristina Wilkinson with [facility] left v/m that on 04/19/19 pt said the recliner dumped her out in the floor. On 04/20/19 in the morning pt's legs got weak and pt sat down in the floor; pt did not fall; pt had life line and firemen came to get pt up. On 04/20/19 pt slid out of recliner onto floor with no apparent injury. Kristina Wilkinson wanted Glenda Chroman FNP to be aware.

## 2019-04-21 NOTE — Patient Outreach (Signed)
[facility]) Care Management  04/21/2019  ANALIZE LIZ [birth year] [id number]   CSW made contact with Vella Redhead, RN, with [facility], who is following pt at home. Per [name], pt is receiving HH RN, PT, OT, SW and bath aide.  They anticipate working with pt for another 2 months or so.  Per [name], the [facility] SW has helped pt get life alert set up as well as working on other [employer] linking.  Pt has been referred to [facility] and per [name] they are assisting with arrangements for 2 weeks of "meals for mom". Pt also has arranged some private pay assistance at home for housekeeping and personal care. The [facility] team is assisting her with possible additional providers of private care. Pt continues to decline placement and per [name] they are hoping to get pt's mobility, independence, etc improved by way of their providers and goals set.  In an attempt to not overlap/duplicate support services and to not overwhelm or confuse pt, this CSW will plan to check in every 10-14 days for updates on progress, plans and needs.  CSW has stressed to [facility] to share my contact info with [facility] SW and for them to call if needs arise and at time of their closure. CSW will reassess at that time for additional support and needs.  CSW will update [facility] team as well as PCP to above updates.   [name], MSW, [place] Worker  [place] [phone]

## 2019-04-22 DIAGNOSIS — M150 Primary generalized (osteo)arthritis: Secondary | ICD-10-CM | POA: Diagnosis not present

## 2019-04-22 DIAGNOSIS — G8929 Other chronic pain: Secondary | ICD-10-CM | POA: Diagnosis not present

## 2019-04-22 DIAGNOSIS — M25512 Pain in left shoulder: Secondary | ICD-10-CM | POA: Diagnosis not present

## 2019-04-22 DIAGNOSIS — E1142 Type 2 diabetes mellitus with diabetic polyneuropathy: Secondary | ICD-10-CM | POA: Diagnosis not present

## 2019-04-22 DIAGNOSIS — M4800 Spinal stenosis, site unspecified: Secondary | ICD-10-CM | POA: Diagnosis not present

## 2019-04-22 DIAGNOSIS — L89612 Pressure ulcer of right heel, stage 2: Secondary | ICD-10-CM | POA: Diagnosis not present

## 2019-04-22 NOTE — Telephone Encounter (Signed)
Pt is still getting PT services.  Pt is still having leg swelling but she is unable to recline back far enough to get maximum recline level.  Pt got her new bed today but they put it in a room with no room to ambulate around it. Amy states that she was able to help the patient and break that bed down and move it to where she could use the bed. The patient was able to get and out of the bed without assistance. Amy states that she is also trying to talk the patient in getting a lift chair because her recliner literally dumps her in the floor and is unsafe.   Refusing the [facility] [facility] - pt called and cancelled those services.   OT and PT are still continuing services.   Wheelchair coming this week.   She feels that the patient came home too soon from Rehab -- needed more intense therapy. Pt did very well today ambulating around the house today.   Pt is going to start getting "meals on wheels" through [facility]. They will be able to stay with the patient once Amy's services are over. She will have access to a home Nurse, PT/OT, lab services and other services for free. They will coordinate care with the PCP. We should be hearing from them soon.

## 2019-04-22 NOTE — Telephone Encounter (Signed)
Please call Amy, [facility] RN, and see if PT is still working with patient? Any additional needs?

## 2019-04-23 DIAGNOSIS — E1142 Type 2 diabetes mellitus with diabetic polyneuropathy: Secondary | ICD-10-CM | POA: Diagnosis not present

## 2019-04-23 DIAGNOSIS — G8929 Other chronic pain: Secondary | ICD-10-CM | POA: Diagnosis not present

## 2019-04-23 DIAGNOSIS — M150 Primary generalized (osteo)arthritis: Secondary | ICD-10-CM | POA: Diagnosis not present

## 2019-04-23 DIAGNOSIS — M4800 Spinal stenosis, site unspecified: Secondary | ICD-10-CM | POA: Diagnosis not present

## 2019-04-23 DIAGNOSIS — M25512 Pain in left shoulder: Secondary | ICD-10-CM | POA: Diagnosis not present

## 2019-04-23 DIAGNOSIS — L89612 Pressure ulcer of right heel, stage 2: Secondary | ICD-10-CM | POA: Diagnosis not present

## 2019-04-23 NOTE — Telephone Encounter (Signed)
Noted. Thank you for the update.

## 2019-04-24 ENCOUNTER — Telehealth: Payer: Self-pay

## 2019-04-24 ENCOUNTER — Ambulatory Visit: Payer: Self-pay | Admitting: *Deleted

## 2019-04-24 ENCOUNTER — Other Ambulatory Visit: Payer: Self-pay | Admitting: *Deleted

## 2019-04-24 DIAGNOSIS — E1142 Type 2 diabetes mellitus with diabetic polyneuropathy: Secondary | ICD-10-CM | POA: Diagnosis not present

## 2019-04-24 DIAGNOSIS — M150 Primary generalized (osteo)arthritis: Secondary | ICD-10-CM | POA: Diagnosis not present

## 2019-04-24 DIAGNOSIS — G8929 Other chronic pain: Secondary | ICD-10-CM | POA: Diagnosis not present

## 2019-04-24 DIAGNOSIS — M4800 Spinal stenosis, site unspecified: Secondary | ICD-10-CM | POA: Diagnosis not present

## 2019-04-24 DIAGNOSIS — M25512 Pain in left shoulder: Secondary | ICD-10-CM | POA: Diagnosis not present

## 2019-04-24 DIAGNOSIS — R609 Edema, unspecified: Secondary | ICD-10-CM | POA: Diagnosis not present

## 2019-04-24 DIAGNOSIS — L89612 Pressure ulcer of right heel, stage 2: Secondary | ICD-10-CM | POA: Diagnosis not present

## 2019-04-24 NOTE — Patient Outreach (Signed)
[facility]) Care Management  04/24/2019  Kristina Wilkinson Sep 14, 1927 [id number]   Weekly transition of care call placed to member, home number rings busy multiple times.  Call was also placed to listed cell number, recording state member has restrictions and call unable to be completed.  Contacted [facility] nurse [name], noted that she reached out to MD office today.  She state she has reached out to the [facility] team to have the member evaluated as she has had a couple falls this week alone, having to contact EMS to get up up off the floor, and having pain and swelling in her legs.  Member has also stopped taking her Bactrim for her leg infection saying it was making her sick.  [name] has requested a different antibiotic, a doppler study to rule out DVT, and possibly an order for diuretic.  She denies any needs from this care manager, will continue to collaborate.  Will attempt to reach member again within the next 3-4 business days.  [name], [place], MSN [place] [phone]

## 2019-04-24 NOTE — Telephone Encounter (Signed)
Amy, nurse with [facility], called stating she is going for her regular visit with patient now but when she spoke with patient patient noted that she has had difficulty to walk for the past 2 days. Has severe pain in feet and going up her legs, it hurt so bad she could not walk down the hallway to get in her new bed. Amy states she has mentioned to [facility] about checking for DVT before and she really would like to get this done, at least for peace of mind, this can be done in her home. And should patient start diuretic if maybe pain is coming from edema. Patient states she always had this much edema even when she was in the nursing home but they never hurt like this. Please review. CB to Amy is [phone]

## 2019-04-24 NOTE — Telephone Encounter (Signed)
Spoke with Amy. Patient is having pain in both legs and feet with walking, her legs are so tight that Amy does not think she can even check pitting edema. Also patient stopped taking Bactrim antibiotic for her cellulitis/wound on her leg that was prescribed by Debbie. Patient said she felt awful on that medication and will not take it again. The area of cellulitis is red.  Patient also had to call EMS last night because her legs gave out and she sat in the kitchen and had them help her get up. Patient did not fall or get hurt. This is 3rd time EMS had to be called this week to help patient.   I advised Amy of Dr Darnell Level comments. Patient can not get down her steps or out of the house unless EMS is called for transportation to urgent care ( no appointments available in the office today). Amy states they have [facility] services and she can call them so a Nurse Practitioner can come out to the patient's home for evaluation and plan of care. If DVT check is needed they would be able to do that in patient's home. I spoke with Dr Darnell Level and he approved proceeding with [facility] services. Amy will make the call and will update [place] on Monday on how patient is doing and what happened.  FYI to PCP and Dr Darnell Level

## 2019-04-24 NOTE — Telephone Encounter (Signed)
Any calf pain with walking ie claudication symptoms?  Is leg pain bilateral? If so, less likely to be DVT and more likely from chronic venous insufficiency or neuropathy related. Recommend in office evaluation for worsening leg pain.

## 2019-04-24 NOTE — Telephone Encounter (Signed)
Noted  

## 2019-04-27 ENCOUNTER — Telehealth: Payer: Self-pay

## 2019-04-27 ENCOUNTER — Encounter: Payer: Self-pay | Admitting: Family Medicine

## 2019-04-27 DIAGNOSIS — M25512 Pain in left shoulder: Secondary | ICD-10-CM | POA: Diagnosis not present

## 2019-04-27 DIAGNOSIS — G8929 Other chronic pain: Secondary | ICD-10-CM | POA: Diagnosis not present

## 2019-04-27 DIAGNOSIS — M150 Primary generalized (osteo)arthritis: Secondary | ICD-10-CM | POA: Diagnosis not present

## 2019-04-27 DIAGNOSIS — E1142 Type 2 diabetes mellitus with diabetic polyneuropathy: Secondary | ICD-10-CM | POA: Diagnosis not present

## 2019-04-27 DIAGNOSIS — L89612 Pressure ulcer of right heel, stage 2: Secondary | ICD-10-CM | POA: Diagnosis not present

## 2019-04-27 DIAGNOSIS — M4800 Spinal stenosis, site unspecified: Secondary | ICD-10-CM | POA: Diagnosis not present

## 2019-04-27 NOTE — Telephone Encounter (Signed)
Amy nurse with [facility] left v/m; Pt saw home health provider on 04/24/19; pt was checked for DVT and pt was negative for DVT. Pt was changed to Keflex since pt could not take the Bactrim and pt is also refusing to take the Keflex as well. The wound nurse suggest to use Metihoney on rt heel twice a week.Amy needs verbal order for the [place] and Amy thinks Metihoney is a good choice because now pt is having yellow slough from rt heel present that was not there last week. Pt is refusing for Amy to put anything on place on rt buttock. The area on rt buttock is getting larger; now 2 cm x 1.4 cm. Last week Amy applied Desitin to area on rt buttock and pt made no complaint of Desitin burning when applied but later that evening pt said was having burning sensation and pt had to wash off the Desitin. Pt is refusing anything on rt buttock. The w/c cushion has not come yet. Pt has not been sleeping in bed due to all her tax info is on pts bed until the tax preparer comes and then hopefully pt will be able to sleep in her bed.pts legs are still swollen. Amy hopes Tor Netters FNP got the EKG and labs from 3 wks ago. Amy will see pt toward the end of this week. This ends the update on pt.

## 2019-04-27 NOTE — Telephone Encounter (Signed)
Called and spoke with LCSW and [facility] nurse regarding patient's ability to take care of herself. Per [name], patient is alert and answers questions. [name] does not feel that patient will be able to get our of home due to mobility issues and access to home via stairs. Continues to have falls/ weakness. Using Life alert. [place] RN does not think it would be helpful to have conference call with patient due to her hearing issues. Verbal orders given for Metihoney. EKG results not received, [name] will send. She has been admitted to [facility] who will continue with nurse care and PT. I will see if lift chair is covered through [employer].

## 2019-04-28 DIAGNOSIS — M25512 Pain in left shoulder: Secondary | ICD-10-CM | POA: Diagnosis not present

## 2019-04-28 DIAGNOSIS — E1142 Type 2 diabetes mellitus with diabetic polyneuropathy: Secondary | ICD-10-CM | POA: Diagnosis not present

## 2019-04-28 DIAGNOSIS — M4800 Spinal stenosis, site unspecified: Secondary | ICD-10-CM | POA: Diagnosis not present

## 2019-04-28 DIAGNOSIS — L89612 Pressure ulcer of right heel, stage 2: Secondary | ICD-10-CM | POA: Diagnosis not present

## 2019-04-28 DIAGNOSIS — G8929 Other chronic pain: Secondary | ICD-10-CM | POA: Diagnosis not present

## 2019-04-28 DIAGNOSIS — M150 Primary generalized (osteo)arthritis: Secondary | ICD-10-CM | POA: Diagnosis not present

## 2019-04-29 ENCOUNTER — Telehealth: Payer: Self-pay | Admitting: Family Medicine

## 2019-04-29 DIAGNOSIS — L89612 Pressure ulcer of right heel, stage 2: Secondary | ICD-10-CM | POA: Diagnosis not present

## 2019-04-29 DIAGNOSIS — M25512 Pain in left shoulder: Secondary | ICD-10-CM | POA: Diagnosis not present

## 2019-04-29 DIAGNOSIS — E1142 Type 2 diabetes mellitus with diabetic polyneuropathy: Secondary | ICD-10-CM | POA: Diagnosis not present

## 2019-04-29 DIAGNOSIS — G8929 Other chronic pain: Secondary | ICD-10-CM | POA: Diagnosis not present

## 2019-04-29 DIAGNOSIS — M4800 Spinal stenosis, site unspecified: Secondary | ICD-10-CM | POA: Diagnosis not present

## 2019-04-29 DIAGNOSIS — M150 Primary generalized (osteo)arthritis: Secondary | ICD-10-CM | POA: Diagnosis not present

## 2019-04-29 NOTE — Telephone Encounter (Signed)
Amy is aware. She will call back if anything further needed.   FYI PT went out yesterday, leg is red but the patient is refusing to take antibiotic to avoid infection. Pt was advised that she needs to take this, pt still refused. Amy is due go back out to patient home either tomorrow or Friday - she will recheck at that time.   FYI:  Patient is still not sleeping in hospital bed.   She is still sleeping in recliner - upright position.

## 2019-04-29 NOTE — Telephone Encounter (Signed)
Per [facility], lift chair is not covered. Please let [place] nurse know.

## 2019-04-29 NOTE — Telephone Encounter (Signed)
Please call home health nurse, Amie, and let her know that a lift chair is not covered by patient's insurance.

## 2019-04-29 NOTE — Telephone Encounter (Signed)
Noted  

## 2019-04-29 NOTE — Telephone Encounter (Signed)
See 04/27/19 TE

## 2019-04-30 ENCOUNTER — Other Ambulatory Visit: Payer: Self-pay | Admitting: *Deleted

## 2019-04-30 DIAGNOSIS — M150 Primary generalized (osteo)arthritis: Secondary | ICD-10-CM | POA: Diagnosis not present

## 2019-04-30 DIAGNOSIS — M4800 Spinal stenosis, site unspecified: Secondary | ICD-10-CM | POA: Diagnosis not present

## 2019-04-30 DIAGNOSIS — M25512 Pain in left shoulder: Secondary | ICD-10-CM | POA: Diagnosis not present

## 2019-04-30 DIAGNOSIS — L89612 Pressure ulcer of right heel, stage 2: Secondary | ICD-10-CM | POA: Diagnosis not present

## 2019-04-30 DIAGNOSIS — G8929 Other chronic pain: Secondary | ICD-10-CM | POA: Diagnosis not present

## 2019-04-30 DIAGNOSIS — E1142 Type 2 diabetes mellitus with diabetic polyneuropathy: Secondary | ICD-10-CM | POA: Diagnosis not present

## 2019-04-30 NOTE — Patient Outreach (Signed)
[facility]) Care Management  04/30/2019  ANAYLA MCEVERS 18-Sep-1927 [id number]   Weekly transition of care call placed to member, initially successful however she immediately request help calling a number. State she is trying to get a calendar.  Offered to send member a calendar but call was abruptly ended.  Attempted to reach member again but was unsuccessful.  HIPAA compliant voice message left, will follow up within the next 3-4 business days.  [name], [place], MSN [place] [phone]

## 2019-05-01 ENCOUNTER — Other Ambulatory Visit: Payer: Self-pay | Admitting: *Deleted

## 2019-05-01 ENCOUNTER — Ambulatory Visit: Payer: Self-pay | Admitting: *Deleted

## 2019-05-01 NOTE — Patient Outreach (Signed)
[facility]) Care Management  05/01/2019  ANDA JARED [birth year] [id number]   CSW was unable to reach pt by phone today. CSW will plan a follow up outreach call attempt in 3- 4 business days.   [name], MSW, [place] Worker  [place] [phone]

## 2019-05-04 DIAGNOSIS — L89612 Pressure ulcer of right heel, stage 2: Secondary | ICD-10-CM | POA: Diagnosis not present

## 2019-05-04 DIAGNOSIS — M4800 Spinal stenosis, site unspecified: Secondary | ICD-10-CM | POA: Diagnosis not present

## 2019-05-04 DIAGNOSIS — G8929 Other chronic pain: Secondary | ICD-10-CM | POA: Diagnosis not present

## 2019-05-04 DIAGNOSIS — M25512 Pain in left shoulder: Secondary | ICD-10-CM | POA: Diagnosis not present

## 2019-05-04 DIAGNOSIS — E1142 Type 2 diabetes mellitus with diabetic polyneuropathy: Secondary | ICD-10-CM | POA: Diagnosis not present

## 2019-05-04 DIAGNOSIS — M150 Primary generalized (osteo)arthritis: Secondary | ICD-10-CM | POA: Diagnosis not present

## 2019-05-04 NOTE — Progress Notes (Signed)
Noted, discussion with LCSW and notes in Epic.

## 2019-05-06 ENCOUNTER — Ambulatory Visit: Payer: Self-pay | Admitting: *Deleted

## 2019-05-06 ENCOUNTER — Other Ambulatory Visit: Payer: Self-pay | Admitting: *Deleted

## 2019-05-06 DIAGNOSIS — E538 Deficiency of other specified B group vitamins: Secondary | ICD-10-CM | POA: Diagnosis not present

## 2019-05-06 DIAGNOSIS — H9190 Unspecified hearing loss, unspecified ear: Secondary | ICD-10-CM | POA: Diagnosis not present

## 2019-05-06 DIAGNOSIS — Z86718 Personal history of other venous thrombosis and embolism: Secondary | ICD-10-CM | POA: Diagnosis not present

## 2019-05-06 DIAGNOSIS — Z9181 History of falling: Secondary | ICD-10-CM | POA: Diagnosis not present

## 2019-05-06 DIAGNOSIS — M150 Primary generalized (osteo)arthritis: Secondary | ICD-10-CM | POA: Diagnosis not present

## 2019-05-06 DIAGNOSIS — K219 Gastro-esophageal reflux disease without esophagitis: Secondary | ICD-10-CM | POA: Diagnosis not present

## 2019-05-06 DIAGNOSIS — E1142 Type 2 diabetes mellitus with diabetic polyneuropathy: Secondary | ICD-10-CM | POA: Diagnosis not present

## 2019-05-06 DIAGNOSIS — Z87891 Personal history of nicotine dependence: Secondary | ICD-10-CM | POA: Diagnosis not present

## 2019-05-06 DIAGNOSIS — M4800 Spinal stenosis, site unspecified: Secondary | ICD-10-CM | POA: Diagnosis not present

## 2019-05-06 DIAGNOSIS — G8929 Other chronic pain: Secondary | ICD-10-CM | POA: Diagnosis not present

## 2019-05-06 DIAGNOSIS — M25512 Pain in left shoulder: Secondary | ICD-10-CM | POA: Diagnosis not present

## 2019-05-06 DIAGNOSIS — I700 Atherosclerosis of aorta: Secondary | ICD-10-CM | POA: Diagnosis not present

## 2019-05-06 DIAGNOSIS — G2581 Restless legs syndrome: Secondary | ICD-10-CM | POA: Diagnosis not present

## 2019-05-06 DIAGNOSIS — G4700 Insomnia, unspecified: Secondary | ICD-10-CM | POA: Diagnosis not present

## 2019-05-06 DIAGNOSIS — L89612 Pressure ulcer of right heel, stage 2: Secondary | ICD-10-CM | POA: Diagnosis not present

## 2019-05-06 DIAGNOSIS — I10 Essential (primary) hypertension: Secondary | ICD-10-CM | POA: Diagnosis not present

## 2019-05-06 NOTE — Patient Outreach (Signed)
[facility]) Care Management  05/06/2019  Kristina Wilkinson [birth year] [id number]   Weekly transition of care call placed to member.  She report she is unable to talk at this time as she has someone in the home.  This care manager attempted to see if it was the home health team however member was unable to understand what this care manager was asking and the call was ended.  Will follow up with [name] from [facility] for update.  Will attempt to follow up with member again within the next week.  [name], [place], MSN [place] [phone]

## 2019-05-07 ENCOUNTER — Other Ambulatory Visit: Payer: Self-pay | Admitting: *Deleted

## 2019-05-07 NOTE — Patient Outreach (Signed)
[facility]) Care Management  05/07/2019  Kristina Wilkinson February 06, 1928 [id number]   CSW attempted to reach pt as well as Independent Living rep for updates and was unable to reach either.  CSW left HIPPA compliant voice message and will await callback or try again in 3-4 business days.   [name], MSW, [place] Worker  [place] [phone]

## 2019-05-08 DIAGNOSIS — M150 Primary generalized (osteo)arthritis: Secondary | ICD-10-CM | POA: Diagnosis not present

## 2019-05-08 DIAGNOSIS — M4800 Spinal stenosis, site unspecified: Secondary | ICD-10-CM | POA: Diagnosis not present

## 2019-05-08 DIAGNOSIS — G8929 Other chronic pain: Secondary | ICD-10-CM | POA: Diagnosis not present

## 2019-05-08 DIAGNOSIS — L89612 Pressure ulcer of right heel, stage 2: Secondary | ICD-10-CM | POA: Diagnosis not present

## 2019-05-08 DIAGNOSIS — M25512 Pain in left shoulder: Secondary | ICD-10-CM | POA: Diagnosis not present

## 2019-05-08 DIAGNOSIS — E1142 Type 2 diabetes mellitus with diabetic polyneuropathy: Secondary | ICD-10-CM | POA: Diagnosis not present

## 2019-05-08 NOTE — Patient Outreach (Signed)
[facility]) Care Management  05/08/2019  LISLIE GOHLKE [birth year] [id number]   CSW unable to reach pt/family by phone for updates- CSW also left message for agency assisting with home repairs.  Will outreach both again in 3-4 business days if no return call is received.   [name], MSW, [place] Worker  [place] [phone]

## 2019-05-10 DIAGNOSIS — E1142 Type 2 diabetes mellitus with diabetic polyneuropathy: Secondary | ICD-10-CM | POA: Diagnosis not present

## 2019-05-10 DIAGNOSIS — M150 Primary generalized (osteo)arthritis: Secondary | ICD-10-CM | POA: Diagnosis not present

## 2019-05-10 DIAGNOSIS — M4800 Spinal stenosis, site unspecified: Secondary | ICD-10-CM | POA: Diagnosis not present

## 2019-05-10 DIAGNOSIS — L89612 Pressure ulcer of right heel, stage 2: Secondary | ICD-10-CM | POA: Diagnosis not present

## 2019-05-10 DIAGNOSIS — G8929 Other chronic pain: Secondary | ICD-10-CM | POA: Diagnosis not present

## 2019-05-10 DIAGNOSIS — M25512 Pain in left shoulder: Secondary | ICD-10-CM | POA: Diagnosis not present

## 2019-05-13 ENCOUNTER — Other Ambulatory Visit: Payer: Self-pay | Admitting: *Deleted

## 2019-05-13 ENCOUNTER — Telehealth: Payer: Self-pay | Admitting: *Deleted

## 2019-05-13 NOTE — Telephone Encounter (Signed)
Please call and give verbal order for diagnosis.

## 2019-05-13 NOTE — Telephone Encounter (Signed)
Dena with [facility] left VM at Triage, she said for billing purposes they need a verbal approval saying that the wound on her right heel is a stage 2 pressure ulcer, Dena said she looked at the pictures and that's what it looks like and that's what they have it coded as but again they just need a verbal approval saying they can use that diagnosis  CB# [phone]

## 2019-05-13 NOTE — Telephone Encounter (Signed)
Kristina Wilkinson has been notified and verbalized understanding

## 2019-05-13 NOTE — Patient Outreach (Signed)
[facility]) Care Management  05/13/2019  ALYISSA WHIDBEE 1927/09/16 [id number]   Weekly transition of care call placed to member.  She report she is "not getting along good."  Encouraged to elaborate, she does not but just keep stating its hard to manage. Discussed the possibility of being placed at ALF, she refuses.  State she has services coming into the home.  Although she does not feel she have enough hours/help coming in,she is still refusing to consider any other options.  Attempted to discuss use of walker and hospital bed to decrease fall risk however call was disconnected.  Call placed to [name], [facility] to follow up on member's progress with home health.  No answer, voice message left.  Will await call back.  Will follow up with member within the next 2 weeks.  [facility] CM Care Plan Problem One     Most Recent Value  Care Plan Problem One  Risk for readmission related to increased fall risk as evidenced by recent hospitalization  Role Documenting the Problem One  Care Management [place] for Problem One  Active  THN Long Term Goal   Member will not be readmitted to acute care facility within the next 31 days  THN Long Term Goal Start Date  04/06/19  [facility] Long Term Goal Met Date  05/13/19  [facility] CM Short Term Goal #1   Member will report no falls within the next 4 weeks  THN CM Short Term Goal #1 Start Date  05/13/19 [Date reset]  Interventions for Short Term Goal #1  Discussed with member the importance of using walker.  Advised to consider placement     [name], RN, MSN [place] Manager [phone]

## 2019-05-14 ENCOUNTER — Ambulatory Visit: Payer: Self-pay | Admitting: *Deleted

## 2019-05-14 DIAGNOSIS — E1142 Type 2 diabetes mellitus with diabetic polyneuropathy: Secondary | ICD-10-CM | POA: Diagnosis not present

## 2019-05-14 DIAGNOSIS — L89612 Pressure ulcer of right heel, stage 2: Secondary | ICD-10-CM | POA: Diagnosis not present

## 2019-05-14 DIAGNOSIS — M25512 Pain in left shoulder: Secondary | ICD-10-CM | POA: Diagnosis not present

## 2019-05-14 DIAGNOSIS — G8929 Other chronic pain: Secondary | ICD-10-CM | POA: Diagnosis not present

## 2019-05-14 DIAGNOSIS — M4800 Spinal stenosis, site unspecified: Secondary | ICD-10-CM | POA: Diagnosis not present

## 2019-05-14 DIAGNOSIS — M150 Primary generalized (osteo)arthritis: Secondary | ICD-10-CM | POA: Diagnosis not present

## 2019-05-19 DIAGNOSIS — M25512 Pain in left shoulder: Secondary | ICD-10-CM | POA: Diagnosis not present

## 2019-05-19 DIAGNOSIS — M4800 Spinal stenosis, site unspecified: Secondary | ICD-10-CM | POA: Diagnosis not present

## 2019-05-19 DIAGNOSIS — E1142 Type 2 diabetes mellitus with diabetic polyneuropathy: Secondary | ICD-10-CM | POA: Diagnosis not present

## 2019-05-19 DIAGNOSIS — M150 Primary generalized (osteo)arthritis: Secondary | ICD-10-CM | POA: Diagnosis not present

## 2019-05-19 DIAGNOSIS — G8929 Other chronic pain: Secondary | ICD-10-CM | POA: Diagnosis not present

## 2019-05-19 DIAGNOSIS — L89612 Pressure ulcer of right heel, stage 2: Secondary | ICD-10-CM | POA: Diagnosis not present

## 2019-05-20 ENCOUNTER — Ambulatory Visit: Payer: Self-pay | Admitting: *Deleted

## 2019-05-22 DIAGNOSIS — L89612 Pressure ulcer of right heel, stage 2: Secondary | ICD-10-CM | POA: Diagnosis not present

## 2019-05-22 DIAGNOSIS — M25512 Pain in left shoulder: Secondary | ICD-10-CM | POA: Diagnosis not present

## 2019-05-22 DIAGNOSIS — M4800 Spinal stenosis, site unspecified: Secondary | ICD-10-CM | POA: Diagnosis not present

## 2019-05-22 DIAGNOSIS — M150 Primary generalized (osteo)arthritis: Secondary | ICD-10-CM | POA: Diagnosis not present

## 2019-05-22 DIAGNOSIS — E1142 Type 2 diabetes mellitus with diabetic polyneuropathy: Secondary | ICD-10-CM | POA: Diagnosis not present

## 2019-05-22 DIAGNOSIS — G8929 Other chronic pain: Secondary | ICD-10-CM | POA: Diagnosis not present

## 2019-05-25 DIAGNOSIS — L89612 Pressure ulcer of right heel, stage 2: Secondary | ICD-10-CM | POA: Diagnosis not present

## 2019-05-25 DIAGNOSIS — G8929 Other chronic pain: Secondary | ICD-10-CM | POA: Diagnosis not present

## 2019-05-25 DIAGNOSIS — M25512 Pain in left shoulder: Secondary | ICD-10-CM | POA: Diagnosis not present

## 2019-05-25 DIAGNOSIS — M150 Primary generalized (osteo)arthritis: Secondary | ICD-10-CM | POA: Diagnosis not present

## 2019-05-25 DIAGNOSIS — E1142 Type 2 diabetes mellitus with diabetic polyneuropathy: Secondary | ICD-10-CM | POA: Diagnosis not present

## 2019-05-25 DIAGNOSIS — M4800 Spinal stenosis, site unspecified: Secondary | ICD-10-CM | POA: Diagnosis not present

## 2019-05-26 ENCOUNTER — Ambulatory Visit: Payer: Medicare Other | Admitting: *Deleted

## 2019-05-28 DIAGNOSIS — M150 Primary generalized (osteo)arthritis: Secondary | ICD-10-CM | POA: Diagnosis not present

## 2019-05-28 DIAGNOSIS — M25512 Pain in left shoulder: Secondary | ICD-10-CM | POA: Diagnosis not present

## 2019-05-28 DIAGNOSIS — L89612 Pressure ulcer of right heel, stage 2: Secondary | ICD-10-CM | POA: Diagnosis not present

## 2019-05-28 DIAGNOSIS — M4800 Spinal stenosis, site unspecified: Secondary | ICD-10-CM | POA: Diagnosis not present

## 2019-05-28 DIAGNOSIS — G8929 Other chronic pain: Secondary | ICD-10-CM | POA: Diagnosis not present

## 2019-05-28 DIAGNOSIS — E1142 Type 2 diabetes mellitus with diabetic polyneuropathy: Secondary | ICD-10-CM | POA: Diagnosis not present

## 2019-05-29 ENCOUNTER — Other Ambulatory Visit: Payer: Self-pay | Admitting: *Deleted

## 2019-05-29 DIAGNOSIS — M25512 Pain in left shoulder: Secondary | ICD-10-CM | POA: Diagnosis not present

## 2019-05-29 DIAGNOSIS — L89612 Pressure ulcer of right heel, stage 2: Secondary | ICD-10-CM | POA: Diagnosis not present

## 2019-05-29 DIAGNOSIS — E1142 Type 2 diabetes mellitus with diabetic polyneuropathy: Secondary | ICD-10-CM | POA: Diagnosis not present

## 2019-05-29 DIAGNOSIS — M4800 Spinal stenosis, site unspecified: Secondary | ICD-10-CM | POA: Diagnosis not present

## 2019-05-29 DIAGNOSIS — M150 Primary generalized (osteo)arthritis: Secondary | ICD-10-CM | POA: Diagnosis not present

## 2019-05-29 DIAGNOSIS — G8929 Other chronic pain: Secondary | ICD-10-CM | POA: Diagnosis not present

## 2019-05-29 NOTE — Patient Outreach (Signed)
[facility]) Care Management  05/29/2019  CHRYSTYNA PEARY 1927/10/29 [id number]   Call placed to member to follow up on current status, no answer. HIPAA compliant voice message left.  Spoke with [name] from [facility], she report member is doing a little better however there is still concern with her living on her own.  Member continues to refuse many recommendations (such as ALF, in home aide, providing financial statements to obtain phone equipment for hearing impaired).  [facility] will continue to be involved over the next several weeks.  This care manager will continue to collaborate with [name], will follow up with member within the next 2 weeks.  [name], [place], MSN [place] [phone]

## 2019-06-01 DIAGNOSIS — M25512 Pain in left shoulder: Secondary | ICD-10-CM | POA: Diagnosis not present

## 2019-06-01 DIAGNOSIS — E1142 Type 2 diabetes mellitus with diabetic polyneuropathy: Secondary | ICD-10-CM | POA: Diagnosis not present

## 2019-06-01 DIAGNOSIS — M150 Primary generalized (osteo)arthritis: Secondary | ICD-10-CM | POA: Diagnosis not present

## 2019-06-01 DIAGNOSIS — M4800 Spinal stenosis, site unspecified: Secondary | ICD-10-CM | POA: Diagnosis not present

## 2019-06-01 DIAGNOSIS — L89612 Pressure ulcer of right heel, stage 2: Secondary | ICD-10-CM | POA: Diagnosis not present

## 2019-06-01 DIAGNOSIS — G8929 Other chronic pain: Secondary | ICD-10-CM | POA: Diagnosis not present

## 2019-06-02 ENCOUNTER — Other Ambulatory Visit: Payer: Medicare Other | Admitting: *Deleted

## 2019-06-02 DIAGNOSIS — G8929 Other chronic pain: Secondary | ICD-10-CM | POA: Diagnosis not present

## 2019-06-02 DIAGNOSIS — E1142 Type 2 diabetes mellitus with diabetic polyneuropathy: Secondary | ICD-10-CM | POA: Diagnosis not present

## 2019-06-02 DIAGNOSIS — M150 Primary generalized (osteo)arthritis: Secondary | ICD-10-CM | POA: Diagnosis not present

## 2019-06-02 DIAGNOSIS — M4800 Spinal stenosis, site unspecified: Secondary | ICD-10-CM | POA: Diagnosis not present

## 2019-06-02 DIAGNOSIS — L89612 Pressure ulcer of right heel, stage 2: Secondary | ICD-10-CM | POA: Diagnosis not present

## 2019-06-02 DIAGNOSIS — M25512 Pain in left shoulder: Secondary | ICD-10-CM | POA: Diagnosis not present

## 2019-06-02 NOTE — Patient Outreach (Signed)
[facility]) Care Management  06/02/2019  KENORA WIDMER Jun 06, 1927 [id number]   CSW made contact with pt today who reports all is going well. She shared that her [facility] care is going well and she is doing better.  The [facility] agency has also provided a SW who is working with pt and offering resources, support and  CSW also contacted [name], [facility] RN, who also reports "she is doing well- when she participates she does well and then other times refuses for them to come out".   Pt continues to decline SNF; per report, [facility] will take over when the [facility] ends.  [name], HHRN, is continuing to work on her heel ulcer and is also working to get pt an amplifier to help with her hearing loss.  Per [name], she is awaiting paperwork from ENT office.  CSW offered to call to request this also and has left a message for Dr. Collier Bullock assistant. Will need records/report from a current audiogram or hearing test and a "certification of equipment" form from ENT.  CSW will await communication with ENT office prior to closing referral.  CSW will also send an in-basket message to ENT office.    [name], MSW, [place] Worker  [place] [phone]

## 2019-06-03 ENCOUNTER — Other Ambulatory Visit: Payer: Self-pay | Admitting: Family Medicine

## 2019-06-03 NOTE — Telephone Encounter (Signed)
Allie from [facility] left a voicemail stating that they requested a refill on Hydralazine for patient last week. Called back and spoke to [place] and was advised that they sent the request over last week electronically and by fax. Doreatha Massed we have no record of receiving the refill request from them. Requested Robert to send it again electronically to see if there may be a problem getting the request from them. The refill came thru shortly after our conversation Last refill 04/03/19 #90 Last office visit 04/10/19.

## 2019-06-04 DIAGNOSIS — E1142 Type 2 diabetes mellitus with diabetic polyneuropathy: Secondary | ICD-10-CM | POA: Diagnosis not present

## 2019-06-04 DIAGNOSIS — M4800 Spinal stenosis, site unspecified: Secondary | ICD-10-CM | POA: Diagnosis not present

## 2019-06-04 DIAGNOSIS — G8929 Other chronic pain: Secondary | ICD-10-CM | POA: Diagnosis not present

## 2019-06-04 DIAGNOSIS — L89612 Pressure ulcer of right heel, stage 2: Secondary | ICD-10-CM | POA: Diagnosis not present

## 2019-06-04 DIAGNOSIS — M25512 Pain in left shoulder: Secondary | ICD-10-CM | POA: Diagnosis not present

## 2019-06-04 DIAGNOSIS — M150 Primary generalized (osteo)arthritis: Secondary | ICD-10-CM | POA: Diagnosis not present

## 2019-06-05 DIAGNOSIS — H9190 Unspecified hearing loss, unspecified ear: Secondary | ICD-10-CM | POA: Diagnosis not present

## 2019-06-05 DIAGNOSIS — M150 Primary generalized (osteo)arthritis: Secondary | ICD-10-CM | POA: Diagnosis not present

## 2019-06-05 DIAGNOSIS — I10 Essential (primary) hypertension: Secondary | ICD-10-CM | POA: Diagnosis not present

## 2019-06-05 DIAGNOSIS — G8929 Other chronic pain: Secondary | ICD-10-CM | POA: Diagnosis not present

## 2019-06-05 DIAGNOSIS — M25512 Pain in left shoulder: Secondary | ICD-10-CM | POA: Diagnosis not present

## 2019-06-05 DIAGNOSIS — Z87891 Personal history of nicotine dependence: Secondary | ICD-10-CM | POA: Diagnosis not present

## 2019-06-05 DIAGNOSIS — Z86718 Personal history of other venous thrombosis and embolism: Secondary | ICD-10-CM | POA: Diagnosis not present

## 2019-06-05 DIAGNOSIS — E1142 Type 2 diabetes mellitus with diabetic polyneuropathy: Secondary | ICD-10-CM | POA: Diagnosis not present

## 2019-06-05 DIAGNOSIS — E538 Deficiency of other specified B group vitamins: Secondary | ICD-10-CM | POA: Diagnosis not present

## 2019-06-05 DIAGNOSIS — G4700 Insomnia, unspecified: Secondary | ICD-10-CM | POA: Diagnosis not present

## 2019-06-05 DIAGNOSIS — G2581 Restless legs syndrome: Secondary | ICD-10-CM | POA: Diagnosis not present

## 2019-06-05 DIAGNOSIS — L89612 Pressure ulcer of right heel, stage 2: Secondary | ICD-10-CM | POA: Diagnosis not present

## 2019-06-05 DIAGNOSIS — Z9181 History of falling: Secondary | ICD-10-CM | POA: Diagnosis not present

## 2019-06-05 DIAGNOSIS — I700 Atherosclerosis of aorta: Secondary | ICD-10-CM | POA: Diagnosis not present

## 2019-06-05 DIAGNOSIS — K219 Gastro-esophageal reflux disease without esophagitis: Secondary | ICD-10-CM | POA: Diagnosis not present

## 2019-06-05 DIAGNOSIS — M4800 Spinal stenosis, site unspecified: Secondary | ICD-10-CM | POA: Diagnosis not present

## 2019-06-09 ENCOUNTER — Ambulatory Visit: Payer: Self-pay | Admitting: *Deleted

## 2019-06-09 ENCOUNTER — Other Ambulatory Visit: Payer: Self-pay | Admitting: *Deleted

## 2019-06-09 NOTE — Patient Outreach (Signed)
[facility]) Care Management  06/09/2019  ABBYGAEL TARNOWSKI [birth year] [id number]  CSW attempted contact with pt by phone and left a message.  CSW also attempted contact with pt's son and left a HIPPA compliant message for him as well. CSW spoke with [facility] who is still working to get the ENT paperwork; CSW advised [facility] of call and message left with no return call yet.  CSW plans to close referral at this time. If patient/family interest and needs change; please re-consult.  CSW will advise PCP and [facility] team of above.   [name], MSW, [place] Worker  [place] [phone]

## 2019-06-10 ENCOUNTER — Other Ambulatory Visit: Payer: Self-pay | Admitting: *Deleted

## 2019-06-10 NOTE — Patient Outreach (Signed)
[place] [facility]) Care Management  06/10/2019  CALIA NAPP 06-Dec-1927 [id number]   Call placed to member to follow up on current status.  She was unable to hear this care manager's questions but repeatedly stated "I'm fine."  Call then placed to [name], RN with [facility], she report she was in the home visiting the member today.  PT is still seeing the member but she remains slightly weak in her legs.  [name] report member is better with taking her medications and the wound on her heel is almost completely healed.  She is still working on getting member Science writer for her phone, however member had been reluctant to provide financial information.  She has since been able to speak with [employer], he will provide needed paperwork.  Will follow up with member and/or [name] within the next month.  If she has amplifier, will send to health coach for ongoing involvement.  If not, will close case as telephonic services will not benefit member.  With either scenario, [facility] will remain involved for home visits.  THN CM Care Plan Problem One     Most Recent Value  Care Plan Problem One  Risk for readmission related to increased fall risk as evidenced by recent hospitalization  Role Documenting the Problem One  Care Management [place] for Problem One  Active  THN CM Short Term Goal #1   Member will report no falls within the next 4 weeks  THN CM Short Term Goal #1 Start Date  05/13/19 [Date reset]  THN CM Short Term Goal #1 Met Date  06/10/19  [facility] CM Short Term Goal #2   Member will have phone technology to enhance hearing within the next 4 weeks  THN CM Short Term Goal #2 Start Date  06/10/19  Interventions for Short Term Goal #2  Collaborated with home health nurse regarding progress of resources     [name], Therapist, sports, MSN [place] Manager [phone]

## 2019-06-12 ENCOUNTER — Ambulatory Visit: Payer: Self-pay | Admitting: *Deleted

## 2019-06-12 ENCOUNTER — Telehealth: Payer: Self-pay

## 2019-06-12 NOTE — Telephone Encounter (Signed)
Called and spoke to [facility] nurse [name]. She went to patient's house and cleaned up patient, got her to the bathroom, provided her a meal. [facility] is going to obtain xray of patient's knee which has been hurting. [name] will let me know if anything is needed. Patient refused ER evaluation.

## 2019-06-12 NOTE — Telephone Encounter (Signed)
Amy with [facility], called to let Jackelyn Poling know that patient called Amy Monday and told her that she was washing dishes on Monday 06/08/19 and felt like her legs were weak and were going to give out so she sat down on the floor and called life alert people. EMS came out and helped her get up. Then patient called Amy today and said yesterday-06/11/19, and told her that she sat on the toilet yesterday from 8 am till 2 pm because she could not get up and waited that long to call life alert to help her. Today she is sitting in the recliner and keeps saying she can not get up, she has been in the recliner since yesterday, has not eaten and has not went to use the bathroom. Patient could not tell Amy if her legs are weak or hurt. Amy is on the way to see patient now and Amy called their remote health and medic through their services will meet Amy at patient's home to access the situation. Amy will call us back with more update, just wanted to let Jackelyn Poling know what is going on at this time. Amy's Cb is [phone] if any questions at this time.

## 2019-06-13 DIAGNOSIS — M25561 Pain in right knee: Secondary | ICD-10-CM | POA: Diagnosis not present

## 2019-06-13 DIAGNOSIS — Z96651 Presence of right artificial knee joint: Secondary | ICD-10-CM | POA: Diagnosis not present

## 2019-06-15 ENCOUNTER — Telehealth: Payer: Self-pay | Admitting: *Deleted

## 2019-06-15 ENCOUNTER — Other Ambulatory Visit: Payer: Self-pay | Admitting: Family Medicine

## 2019-06-15 DIAGNOSIS — M25569 Pain in unspecified knee: Secondary | ICD-10-CM

## 2019-06-15 MED ORDER — DICLOFENAC SODIUM 1 % EX GEL: 2.0000 g | Freq: Four times a day (QID) | CUTANEOUS | 1 refills | Status: DC

## 2019-06-15 NOTE — Telephone Encounter (Signed)
Amy nurse with [facility] left a voicemail stating that she is out doing a nurse visit today. Amy stated that the patient is still complaining of her knee bothering her and the xray came back normal. Amy wants to know if Tor Netters NP ever got the mobile xray report? Amy stated that patient is in AFib today and her heart rate is 100-132 and it is all over the place and worse than usual. Amy stated that patient is refusing to go to the hospital. Amy stated that patient is not even on a baby aspirin. Amy stated that she has reached out to [facility] trying to get someone out there to do an EKG rhythm strip, but she may be back in rhythm by that time. Amy wants to know what Tor Netters NP recommends?

## 2019-06-15 NOTE — Telephone Encounter (Signed)
Called and spoke with [name]. She is working with patient to get her some daytime help. Patient refuses bedside toilet. PT will evaluate for toilet lift. [name] will obtain rhythm strip. Discussed whether patient may be dehydrated. She only voided a couple of times yesterday. Will send in Voltaren gel for knee pain. Patient does not like to take oral medications. Patient continues to refuse to go to ER/ UC. She has people checking in on her. Does not want to pay for Mother's meals. Will increase PT to twice a week. Await rhythm strip. Patient apparently agitated with knee pain. Xray normal. Will have PT evaluate for possible brace.

## 2019-06-23 DIAGNOSIS — Z9181 History of falling: Secondary | ICD-10-CM | POA: Diagnosis not present

## 2019-06-23 DIAGNOSIS — Z86718 Personal history of other venous thrombosis and embolism: Secondary | ICD-10-CM | POA: Diagnosis not present

## 2019-06-23 DIAGNOSIS — Z20822 Contact with and (suspected) exposure to covid-19: Secondary | ICD-10-CM | POA: Diagnosis not present

## 2019-06-23 DIAGNOSIS — I82461 Acute embolism and thrombosis of right calf muscular vein: Secondary | ICD-10-CM | POA: Diagnosis not present

## 2019-06-23 DIAGNOSIS — E860 Dehydration: Secondary | ICD-10-CM | POA: Diagnosis present

## 2019-06-23 DIAGNOSIS — R7989 Other specified abnormal findings of blood chemistry: Secondary | ICD-10-CM | POA: Diagnosis not present

## 2019-06-23 DIAGNOSIS — R2689 Other abnormalities of gait and mobility: Secondary | ICD-10-CM | POA: Diagnosis not present

## 2019-06-23 DIAGNOSIS — I160 Hypertensive urgency: Secondary | ICD-10-CM | POA: Diagnosis present

## 2019-06-23 DIAGNOSIS — Z823 Family history of stroke: Secondary | ICD-10-CM | POA: Diagnosis not present

## 2019-06-23 DIAGNOSIS — Z87891 Personal history of nicotine dependence: Secondary | ICD-10-CM | POA: Diagnosis not present

## 2019-06-23 DIAGNOSIS — I491 Atrial premature depolarization: Secondary | ICD-10-CM | POA: Diagnosis not present

## 2019-06-23 DIAGNOSIS — L89311 Pressure ulcer of right buttock, stage 1: Secondary | ICD-10-CM | POA: Diagnosis not present

## 2019-06-23 DIAGNOSIS — I959 Hypotension, unspecified: Secondary | ICD-10-CM | POA: Diagnosis not present

## 2019-06-23 DIAGNOSIS — R296 Repeated falls: Secondary | ICD-10-CM | POA: Diagnosis present

## 2019-06-23 DIAGNOSIS — R279 Unspecified lack of coordination: Secondary | ICD-10-CM | POA: Diagnosis not present

## 2019-06-23 DIAGNOSIS — I878 Other specified disorders of veins: Secondary | ICD-10-CM | POA: Diagnosis not present

## 2019-06-23 DIAGNOSIS — R2681 Unsteadiness on feet: Secondary | ICD-10-CM | POA: Diagnosis not present

## 2019-06-23 DIAGNOSIS — Z9049 Acquired absence of other specified parts of digestive tract: Secondary | ICD-10-CM | POA: Diagnosis not present

## 2019-06-23 DIAGNOSIS — R531 Weakness: Secondary | ICD-10-CM | POA: Diagnosis not present

## 2019-06-23 DIAGNOSIS — Z8249 Family history of ischemic heart disease and other diseases of the circulatory system: Secondary | ICD-10-CM | POA: Diagnosis not present

## 2019-06-23 DIAGNOSIS — Z7409 Other reduced mobility: Secondary | ICD-10-CM | POA: Diagnosis not present

## 2019-06-23 DIAGNOSIS — R41841 Cognitive communication deficit: Secondary | ICD-10-CM | POA: Diagnosis not present

## 2019-06-23 DIAGNOSIS — I498 Other specified cardiac arrhythmias: Secondary | ICD-10-CM | POA: Diagnosis not present

## 2019-06-23 DIAGNOSIS — M6259 Muscle wasting and atrophy, not elsewhere classified, multiple sites: Secondary | ICD-10-CM | POA: Diagnosis not present

## 2019-06-23 DIAGNOSIS — R197 Diarrhea, unspecified: Secondary | ICD-10-CM | POA: Diagnosis not present

## 2019-06-23 DIAGNOSIS — M6281 Muscle weakness (generalized): Secondary | ICD-10-CM | POA: Diagnosis not present

## 2019-06-23 DIAGNOSIS — I10 Essential (primary) hypertension: Secondary | ICD-10-CM | POA: Diagnosis present

## 2019-06-23 DIAGNOSIS — I493 Ventricular premature depolarization: Secondary | ICD-10-CM | POA: Diagnosis not present

## 2019-06-23 DIAGNOSIS — I82401 Acute embolism and thrombosis of unspecified deep veins of right lower extremity: Secondary | ICD-10-CM | POA: Diagnosis present

## 2019-06-23 DIAGNOSIS — G2581 Restless legs syndrome: Secondary | ICD-10-CM | POA: Diagnosis present

## 2019-06-23 DIAGNOSIS — Z743 Need for continuous supervision: Secondary | ICD-10-CM | POA: Diagnosis not present

## 2019-06-23 DIAGNOSIS — M48061 Spinal stenosis, lumbar region without neurogenic claudication: Secondary | ICD-10-CM | POA: Diagnosis not present

## 2019-06-23 DIAGNOSIS — Z79899 Other long term (current) drug therapy: Secondary | ICD-10-CM | POA: Diagnosis not present

## 2019-06-23 DIAGNOSIS — L89321 Pressure ulcer of left buttock, stage 1: Secondary | ICD-10-CM | POA: Diagnosis present

## 2019-06-23 DIAGNOSIS — R5381 Other malaise: Secondary | ICD-10-CM | POA: Diagnosis not present

## 2019-06-23 DIAGNOSIS — R52 Pain, unspecified: Secondary | ICD-10-CM | POA: Diagnosis not present

## 2019-06-24 ENCOUNTER — Telehealth: Payer: Self-pay | Admitting: *Deleted

## 2019-06-24 MED ORDER — HYDRALAZINE HCL 25 MG PO TABS
25.00 | ORAL_TABLET | ORAL | Status: DC
Start: 2019-06-24 — End: 2019-06-24

## 2019-06-24 MED ORDER — ROPINIROLE HCL 0.25 MG PO TABS
0.25 | ORAL_TABLET | ORAL | Status: DC
Start: 2019-06-29 — End: 2019-06-24

## 2019-06-24 MED ORDER — HYDROCHLOROTHIAZIDE 25 MG PO TABS
25.00 | ORAL_TABLET | ORAL | Status: DC
Start: 2019-06-24 — End: 2019-06-24

## 2019-06-24 MED ORDER — SENNOSIDES-DOCUSATE SODIUM 8.6-50 MG PO TABS
1.00 | ORAL_TABLET | ORAL | Status: DC
Start: 2019-06-24 — End: 2019-06-24

## 2019-06-24 MED ORDER — ENOXAPARIN SODIUM 40 MG/0.4ML ~~LOC~~ SOLN
40.00 | SUBCUTANEOUS | Status: DC
Start: 2019-06-25 — End: 2019-06-24

## 2019-06-24 MED ORDER — CYANOCOBALAMIN 100 MCG PO TABS
100.00 | ORAL_TABLET | ORAL | Status: DC
Start: 2019-06-25 — End: 2019-06-24

## 2019-06-24 NOTE — Telephone Encounter (Signed)
Noted. Able to see admission in Care Everywhere.

## 2019-06-24 NOTE — Telephone Encounter (Signed)
Amy nurse with [facility] called to let Tor Netters NP know that the patient is declining. Amy stated that she called the patient yesterday to give her information on her medication. Amy stated that patient told her that she was stuck on the bench in her laundry room and could not get up. Amy stated that patient had diarrhea all day yesterday.  Amy stated that she went out to the patient's home around 5:40 and she could not get her up.Amy stated that she ended up calling EMS and patient insisted that they take her to [facility] stated that she is unsure of the status of the patient now. Amy stated that if patient comes home and is not mobile like she was yesterday [facility]  will not take her back as a patient.

## 2019-06-29 DIAGNOSIS — G2581 Restless legs syndrome: Secondary | ICD-10-CM | POA: Diagnosis not present

## 2019-06-29 DIAGNOSIS — I160 Hypertensive urgency: Secondary | ICD-10-CM | POA: Diagnosis not present

## 2019-06-29 DIAGNOSIS — R52 Pain, unspecified: Secondary | ICD-10-CM | POA: Diagnosis not present

## 2019-06-29 DIAGNOSIS — R296 Repeated falls: Secondary | ICD-10-CM | POA: Diagnosis not present

## 2019-06-29 DIAGNOSIS — M6281 Muscle weakness (generalized): Secondary | ICD-10-CM | POA: Diagnosis not present

## 2019-06-29 DIAGNOSIS — U071 COVID-19: Secondary | ICD-10-CM | POA: Diagnosis not present

## 2019-06-29 DIAGNOSIS — R7989 Other specified abnormal findings of blood chemistry: Secondary | ICD-10-CM | POA: Diagnosis not present

## 2019-06-29 DIAGNOSIS — R41841 Cognitive communication deficit: Secondary | ICD-10-CM | POA: Diagnosis not present

## 2019-06-29 DIAGNOSIS — R799 Abnormal finding of blood chemistry, unspecified: Secondary | ICD-10-CM | POA: Diagnosis not present

## 2019-06-29 DIAGNOSIS — I82401 Acute embolism and thrombosis of unspecified deep veins of right lower extremity: Secondary | ICD-10-CM | POA: Diagnosis not present

## 2019-06-29 DIAGNOSIS — E872 Acidosis: Secondary | ICD-10-CM | POA: Diagnosis not present

## 2019-06-29 DIAGNOSIS — L89311 Pressure ulcer of right buttock, stage 1: Secondary | ICD-10-CM | POA: Diagnosis not present

## 2019-06-29 DIAGNOSIS — Z7409 Other reduced mobility: Secondary | ICD-10-CM | POA: Diagnosis not present

## 2019-06-29 DIAGNOSIS — Z86718 Personal history of other venous thrombosis and embolism: Secondary | ICD-10-CM | POA: Diagnosis not present

## 2019-06-29 DIAGNOSIS — M4800 Spinal stenosis, site unspecified: Secondary | ICD-10-CM | POA: Diagnosis not present

## 2019-06-29 DIAGNOSIS — M48061 Spinal stenosis, lumbar region without neurogenic claudication: Secondary | ICD-10-CM | POA: Diagnosis not present

## 2019-06-29 DIAGNOSIS — R279 Unspecified lack of coordination: Secondary | ICD-10-CM | POA: Diagnosis not present

## 2019-06-29 DIAGNOSIS — I959 Hypotension, unspecified: Secondary | ICD-10-CM | POA: Diagnosis not present

## 2019-06-29 DIAGNOSIS — M6259 Muscle wasting and atrophy, not elsewhere classified, multiple sites: Secondary | ICD-10-CM | POA: Diagnosis not present

## 2019-06-29 DIAGNOSIS — I10 Essential (primary) hypertension: Secondary | ICD-10-CM | POA: Diagnosis not present

## 2019-06-29 DIAGNOSIS — R531 Weakness: Secondary | ICD-10-CM | POA: Diagnosis not present

## 2019-06-29 DIAGNOSIS — L89613 Pressure ulcer of right heel, stage 3: Secondary | ICD-10-CM | POA: Diagnosis not present

## 2019-06-29 DIAGNOSIS — R2689 Other abnormalities of gait and mobility: Secondary | ICD-10-CM | POA: Diagnosis not present

## 2019-06-29 DIAGNOSIS — R2681 Unsteadiness on feet: Secondary | ICD-10-CM | POA: Diagnosis not present

## 2019-06-29 DIAGNOSIS — Z743 Need for continuous supervision: Secondary | ICD-10-CM | POA: Diagnosis not present

## 2019-06-29 MED ORDER — APIXABAN 2.5 MG PO TABS
10.00 | ORAL_TABLET | ORAL | Status: DC
Start: 2019-06-29 — End: 2019-06-29

## 2019-06-29 MED ORDER — ACETAMINOPHEN 325 MG PO TABS
650.00 | ORAL_TABLET | ORAL | Status: DC
Start: ? — End: 2019-06-29

## 2019-06-29 MED ORDER — HYDRALAZINE HCL 50 MG PO TABS
100.00 | ORAL_TABLET | ORAL | Status: DC
Start: 2019-06-29 — End: 2019-06-29

## 2019-06-29 MED ORDER — POLYETHYLENE GLYCOL 3350 17 GM/SCOOP PO POWD
17.00 | ORAL | Status: DC
Start: 2019-06-29 — End: 2019-06-29

## 2019-06-29 MED ORDER — CARBOXYMETHYLCELLULOSE SODIUM 0.5 % OP SOLN
2.00 | OPHTHALMIC | Status: DC
Start: 2019-06-29 — End: 2019-06-29

## 2019-06-30 DIAGNOSIS — I82401 Acute embolism and thrombosis of unspecified deep veins of right lower extremity: Secondary | ICD-10-CM | POA: Diagnosis not present

## 2019-06-30 DIAGNOSIS — I10 Essential (primary) hypertension: Secondary | ICD-10-CM | POA: Diagnosis not present

## 2019-06-30 DIAGNOSIS — R296 Repeated falls: Secondary | ICD-10-CM | POA: Diagnosis not present

## 2019-06-30 DIAGNOSIS — M4800 Spinal stenosis, site unspecified: Secondary | ICD-10-CM | POA: Diagnosis not present

## 2019-07-02 ENCOUNTER — Other Ambulatory Visit: Payer: Self-pay | Admitting: *Deleted

## 2019-07-02 DIAGNOSIS — M4800 Spinal stenosis, site unspecified: Secondary | ICD-10-CM | POA: Diagnosis not present

## 2019-07-02 DIAGNOSIS — I82401 Acute embolism and thrombosis of unspecified deep veins of right lower extremity: Secondary | ICD-10-CM | POA: Diagnosis not present

## 2019-07-02 DIAGNOSIS — L89613 Pressure ulcer of right heel, stage 3: Secondary | ICD-10-CM | POA: Diagnosis not present

## 2019-07-02 DIAGNOSIS — I10 Essential (primary) hypertension: Secondary | ICD-10-CM | POA: Diagnosis not present

## 2019-07-02 DIAGNOSIS — R296 Repeated falls: Secondary | ICD-10-CM | POA: Diagnosis not present

## 2019-07-02 NOTE — Patient Outreach (Signed)
Screened for potential [facility] Care Management needs as a benefit of  [employer] A[employer] Medicare.  Mrs. Sokolov is currently receiving skilled therapy at [facility].   Writer attended telephonic interdisciplinary team meeting to assess for disposition needs and transition plan for resident.   Discussed member is active with [place] Management. Member's hearing has been a barrier for [facility] RNCM to communicate with over the phone. She is also active with [facility] and [facility]. Member has been adamant about remaining in her home and not going to ALF as [facility] CM team has suggested. [facility] Care Management team has had difficulty in reaching member's son. Will confirm with facility SW son's contact information.  Will make [facility] Care Management RNCM and [facility] aware of SNF admit.   Per Patient Pearletha For[place], member [facility] from [facility] to [facility].  Will continue to follow for transition needs while member resides in SNF.    [name], MSN-Ed, RN,BSN [place] Acute Care Coordinator [phone] [facility]) [phone]  (Toll free office)

## 2019-07-03 ENCOUNTER — Telehealth: Payer: Self-pay

## 2019-07-03 NOTE — Telephone Encounter (Signed)
I received a call from [facility] with [place] stating that she either needs orders faxed to their location or orders placed in Epic for patient to have OT services.  Delle Reining can be reached at [phone]

## 2019-07-03 NOTE — Telephone Encounter (Signed)
Spoke with [employer]. Orders from 03/2019. She will have them resent to be signed. Per Care Everywhere, patient was discharged to [facility].

## 2019-07-06 DIAGNOSIS — L89613 Pressure ulcer of right heel, stage 3: Secondary | ICD-10-CM | POA: Diagnosis not present

## 2019-07-07 ENCOUNTER — Other Ambulatory Visit: Payer: Self-pay | Admitting: *Deleted

## 2019-07-07 DIAGNOSIS — I10 Essential (primary) hypertension: Secondary | ICD-10-CM | POA: Diagnosis not present

## 2019-07-07 DIAGNOSIS — I82401 Acute embolism and thrombosis of unspecified deep veins of right lower extremity: Secondary | ICD-10-CM | POA: Diagnosis not present

## 2019-07-07 DIAGNOSIS — R296 Repeated falls: Secondary | ICD-10-CM | POA: Diagnosis not present

## 2019-07-07 NOTE — Patient Outreach (Signed)
[facility]) Care Management  07/07/2019  [name] [birth year] [id number]   Member scheduled for outreach tomorrow, notified by post acute coordinator that member had been admitted to hospital [facility] 5/11-5/17) and discharged to SNF.  Will await notification from [facility] that member has discharged back home and will follow up at that time.  [name], [place], MSN [place] [phone]

## 2019-07-08 ENCOUNTER — Ambulatory Visit: Payer: Self-pay | Admitting: *Deleted

## 2019-07-09 DIAGNOSIS — R799 Abnormal finding of blood chemistry, unspecified: Secondary | ICD-10-CM | POA: Diagnosis not present

## 2019-07-09 DIAGNOSIS — E872 Acidosis: Secondary | ICD-10-CM | POA: Diagnosis not present

## 2019-07-10 ENCOUNTER — Other Ambulatory Visit: Payer: Self-pay | Admitting: *Deleted

## 2019-07-10 NOTE — Patient Outreach (Signed)
Late entry for 07/09/19  Screened for potential [facility] Care Management needs as a benefit of  [employer] ACO Medicare.  Mrs. Kloth is receiving skilled therapy at [facility].  Writer attended telephonic interdisciplinary team meeting to assess for disposition needs and transition plan for resident.   Writer made facility staff aware that member is not safe at home. Per [facility] RN , member has frequent falls at home. She often calls the fire department after falling. She is very hard of hearing.   Facility reports member has been adamant about returning home.  Member has had some confusion. Confirmed best contact number for son [name].  Will plan outreach to son [name] to discuss transition plans.  Will continue to follow while member remains in SNF.   [name], MSN-Ed, RN,BSN [place] Acute Care Coordinator [phone] [facility]) [phone]  (Toll free office)

## 2019-07-13 DIAGNOSIS — L89613 Pressure ulcer of right heel, stage 3: Secondary | ICD-10-CM | POA: Diagnosis not present

## 2019-07-21 ENCOUNTER — Other Ambulatory Visit: Payer: Self-pay | Admitting: *Deleted

## 2019-07-21 NOTE — Patient Outreach (Signed)
THN Post- Acute Care Coordinator follow up  Kristina Wilkinson is receiving skilled therapy at [facility].   Telephone call made to [facility] son [name] [phone]. Patient identifiers confirmed. [name] states the transition plan is for member to return home. States member "says she will have someone stay with her." However, [name] states he does not know any details.  Attempted to reach [name] on her cell [phone]. Appears to be nonworking. Attempted to reach member by calling [place] station. However, phone line was disconnected.  Will continue to follow for transition plans while [name] resides in SNF.    [name], MSN-Ed, RN,BSN [place] Acute Care Coordinator [phone] [facility]) [phone]  (Toll free office)

## 2019-07-23 ENCOUNTER — Other Ambulatory Visit: Payer: Self-pay | Admitting: *Deleted

## 2019-07-23 DIAGNOSIS — U071 COVID-19: Secondary | ICD-10-CM | POA: Diagnosis not present

## 2019-07-23 NOTE — Patient Outreach (Signed)
Screened for potential [facility] Care Management needs as a benefit of  [employer] A[employer] Medicare.  Mrs. Old remains in [facility] for skilled therapy.  Writer attended telephonic interdisciplinary team meeting to assess for disposition needs and transition plan for resident.   Facility reports member is now participating with stair training. Facility states they are unable to find supportive family/friends to assist member at home. States member's son reveals he has health issues himself. Discussed that member was not been safe at home prior and that she has been encouraged to seek ALF placement for quite some time by [facility] CM, [facility], and [facility]. This was mentioned in prior IDT meetings. Question whether member has capacity.   Discussed again that member could benefit from ALF placement. She has been adamant about ALF thus far.  Will continue to follow for transition plans while remaining in SNF.  [name], MSN-Ed, RN,BSN [place] Acute Care Coordinator [phone] [facility]) [phone]  (Toll free office)

## 2019-07-29 ENCOUNTER — Other Ambulatory Visit: Payer: Self-pay | Admitting: *Deleted

## 2019-07-29 DIAGNOSIS — I10 Essential (primary) hypertension: Secondary | ICD-10-CM

## 2019-07-29 NOTE — Patient Outreach (Addendum)
[place] Coordinator follow up  Update received from [place]. Kristina Wilkinson is slated to transition to home on 07/31/19 from [facility]. She is adamant about returning home alone with caregiver support. F  Will resume services with [facility] and [facility]. May need APS referral if member deemed unsafe when she returns home. Will re-refer to [place] Management RNCM.   [name], MSN-Ed, RN,BSN [place] Acute Care Coordinator [phone] [facility]) [phone]  (Toll free office)

## 2019-07-30 DIAGNOSIS — U071 COVID-19: Secondary | ICD-10-CM | POA: Diagnosis not present

## 2019-08-03 ENCOUNTER — Other Ambulatory Visit: Payer: Self-pay | Admitting: *Deleted

## 2019-08-03 ENCOUNTER — Inpatient Hospital Stay: Payer: Medicare Other | Admitting: Family Medicine

## 2019-08-03 ENCOUNTER — Telehealth: Payer: Self-pay | Admitting: Family Medicine

## 2019-08-03 DIAGNOSIS — I10 Essential (primary) hypertension: Secondary | ICD-10-CM

## 2019-08-03 NOTE — Telephone Encounter (Signed)
Spoke with patient - she was aware of todays appt but did not have a way of getting here today. I states that she is doing okay since D/c, has not been out of her house since coming home. I asked her if she is getting [facility] services and she stated yes that they start this week. I asked her if she knew the name of the name of the company coming out to see her and she stated no that its the same one that's always come out.   I asked her if she wanted to go ahead and reschedule her appt for her HFU, patient declined stating that she would call when she was ready to come in and had a ride.   Debbie made aware of the conversation.

## 2019-08-03 NOTE — Patient Outreach (Signed)
[place] [facility]) Care Management  08/03/2019  Kristina Wilkinson [birth year] [id number]   Notified by post acute care coordinator that member was discharged from SNF.  Call placed for transition of care assessment, no answer.  HIPAA compliant voice message left, will follow up within the next 3-4 business days.  [name], [place], MSN [place] [phone]

## 2019-08-03 NOTE — Patient Outreach (Signed)
THN Post- Acute Care Coordinator follow up  Verified in Patient Kristina Wilkinson that [name] transitioned home from [facility].   Request received from [facility] RN for [facility] meal assistance. Will make referral to [facility] LCSW. [facility] RNCM referral made previously. Also followed by [place].   [name], MSN-Ed, RN,BSN [place] Acute Care Coordinator [phone] [facility]) [phone]  (Toll free office)

## 2019-08-03 NOTE — Telephone Encounter (Signed)
Please call patient and check on her. She missed her hospital follow up appointment today. If you are able to get her on the phone, see if she is getting home health services? I see THN is following her.

## 2019-08-04 ENCOUNTER — Encounter: Payer: Self-pay | Admitting: *Deleted

## 2019-08-04 ENCOUNTER — Other Ambulatory Visit: Payer: Self-pay | Admitting: *Deleted

## 2019-08-04 NOTE — Patient Outreach (Signed)
[facility]) Care Management  08/04/2019  TRACHELLE LOW [birth year] [id number]   CSW made an initial attempt to try and contact patient today to perform the phone assessment, as well as assess and assist with social work needs and services, without success.  A HIPAA compliant message was left for patient on voicemail.  CSW is currently awaiting a return call.  CSW will make a second outreach attempt within the next 3-4 business days, if a return call is not received from patient in the meantime.  CSW will also mail an Unsuccessful Outreach Letter to patient's home requesting that patient contact CSW if she is interested in receiving social work services through [place] with Scientist, clinical (histocompatibility and immunogenetics).  Nat Christen, BSW, MSW, LCSW  Licensed Education officer, environmental Health System  Mailing [place] [address], [place], [place] [address] Physical Address-[address], [place], [place] [address] Toll Free Main # [phone] Fax # [phone] Cell # [phone]  Office # [phone] [email or web address]

## 2019-08-06 ENCOUNTER — Other Ambulatory Visit: Payer: Self-pay | Admitting: *Deleted

## 2019-08-06 NOTE — Patient Outreach (Signed)
[facility]) Care Management  08/06/2019  Kristina Wilkinson 1927/06/15 [id number]   Outreach attempt #2, successful.  Notified by post acute care coordinator that member was discharged from SNF.  Call placed for transition of care assessment.  She continues to have difficulty hearing over the phone.  This care manager inquired about ability to care for herself safely in the home without the support needed.  She state she has people coming for therapy and nurses making home visits.  This care manager again explained that this is not providing her the help with daily living activities as she need.  She state she can take care for herself and have "everything taken care of."  She abruptly ends call at that time.  This care manager will follow up with [facility] nurse to collaborate on plan of care.  THN CM Care Plan Problem One     Most Recent Value  Care Plan Problem One Risk for readmission related to increased fall risk as evidenced by recent hospitalization  Role Documenting the Problem One Care Management [place] for Problem One Active  THN Long Term Goal  Member will not be readmitted to hospital within the next 31 days  THN Long Term Goal Start Date 08/06/19  Interventions for Problem One Long Term Goal Member educated on discharge instructions and importance of following plan of care in effort to decrease risk of readmission  THN CM Short Term Goal #1  Member will have follow up appointment with PCP within the next 2 weeks  THN CM Short Term Goal #1 Start Date 08/06/19  Interventions for Short Term Goal #1 Member encouraged to reschedule follow up appointment with PCP  [facility] CM Short Term Goal #2  Member will have phone technology to enhance hearing within the next 4 weeks  THN CM Short Term Goal #2 Start Date 06/10/19  [facility] CM Short Term Goal #2 Met Date --  [Not met]     [name], RN, MSN [place] Manager [phone]

## 2019-08-10 ENCOUNTER — Encounter: Payer: Self-pay | Admitting: *Deleted

## 2019-08-10 ENCOUNTER — Other Ambulatory Visit: Payer: Self-pay | Admitting: *Deleted

## 2019-08-10 NOTE — Patient Outreach (Signed)
[place] [facility]) Care [place]  08/10/2019  Kristina Wilkinson 1927-05-05 [id number]  CSW was able to make initial contact with patient today to perform phone assessment, as well as assess and assist with social work needs and services.  CSW introduced self, explained role and types of services provided through Kristina Wilkinson ([place] Wilkinson).  CSW further explained to patient that CSW works with Kristina Wilkinson, Kristina Wilkinson, also with Kristina Wilkinson.  CSW then explained the reason for the call, indicating that Kristina Wilkinson thought that patient would benefit from social work resources to assist with meal delivery services.  CSW obtained two HIPAA compliant identifiers from patient, which included patient's name and date of birth.  Patient admitted that she could use some assistance with meal preparation, requesting that Kristina Wilkinson place her on the waiting list for Kristina Wilkinson, through Kristina Wilkinson.  CSW agreed to place the referral today; however, CSW explained to patient that there is an extensive waiting list for Kristina Wilkinson, agreeing to set patient up with Kristina Wilkinson, in the meantime.  CSW encouraged patient to listen out for a call from a representative with Kristina Wilkinson, as they will need to perform a telephone interview to determine eligibility.  Patient voiced understanding and was agreeable to this plan, most appreciative of CSW's assistance.  CSW explained to patient that CSW has approved patient for a 28 day supply of Kristina Wilkinson, courtesy of Kristina Wilkinson, and that she is scheduled to receive his first two weeks worth of Wilkinson on Wednesday, August 12, 2019.  CSW further explained to patient that the Wilkinson will be delivered directly to her front door, in a cooler, and that the delivery driver will ring the doorbell to signal that they have arrived.  Last, CSW explained to patient that the Wilkinson will be tailor-made to meet  all of her dietary restrictions, food allergies and food preferences.  Patient is aware that the first 28 days will be covered, but that if she wishes to continue to receive services thereafter, she will be required to pay for these services out of pocket.   Patient can then decide if she wants to cancel Kristina Wilkinson, if and when approved for Kristina Wilkinson, or she can decline Kristina Wilkinson and continue receiving Kristina Wilkinson.  CSW is aware that patient was just discharged from Kristina Wilkinson and Kristina Wilkinson, [place] [facility] to receive short-term rehabilitative services, on Saturday, August 01, 2019, returning home to live alone.  Home Wilkinson services have been arranged for patient through Kristina Wilkinson and patient is also being followed in the community by Kristina Wilkinson, also with Kristina Wilkinson.  Patient has limited support in the home, currently requiring assistance with activities of daily living.    Patient declined the need for in-home care services, admitting that she is not willing to pay for these services if they are not covered by Medicare.  Patient also denied the need for assistance with long-term care placement into an assisted living facility, reporting that she plans to stay in her home for as long as possible.  Patient was unable to identify any additional social work needs at present.  CSW was able to confirm that patient has the correct contact information for CSW, encouraging patient to contact CSW directly if additional social work needs arise in the meantime.  Otherwise, CSW agreed to follow-up with patient again next week, on Friday, August 21, 2019, around  9:Kristina Wilkinson, BSW, MSW, LCSW  Licensed Clinical Social Worker  [place]  Mailing [place]. [address], [place], [place] [address] Physical Address-[address], [place], [place] [address] Toll Free Main # [phone] Fax # [phone] Cell #  [phone]  Office # [phone] [email or web address]

## 2019-08-13 ENCOUNTER — Other Ambulatory Visit: Payer: Self-pay | Admitting: *Deleted

## 2019-08-13 NOTE — Patient Outreach (Signed)
[name] Care Management  08/13/2019  Kristina Wilkinson [birth year] [id number]   Weekly transition of care call placed to member, unsuccessful.  HIPAA compliant voice message left.  Call then placed to [place] nurse, no answer, HIPAA compliant voice message left.   Update:  Call received back from [place], report member is doing much better then when she was initially discharged.  She is moving better but has rejected home health due to inability to have [place] and the same nurse as before she was hospitalized.  CSW has contacted member and initiated community services programs (see note for details) and [name] will continue to make monthly and PRN visits.  This care manager will follow up with member within the next week.  [name], [place], MSN [place] [phone]

## 2019-08-14 NOTE — Telephone Encounter (Signed)
Noted. Following along with THN/ Care management for needs from this office.

## 2019-08-20 ENCOUNTER — Other Ambulatory Visit: Payer: Self-pay | Admitting: *Deleted

## 2019-08-20 NOTE — Patient Outreach (Signed)
[place] [facility]) Care Management  08/20/2019  Kristina Wilkinson [birth year] [id number]   Weekly transition of care call placed to member, no answer.  HIPAA compliant voice message left, will follow up within the next 3-4 business days.  [name], [place], MSN [place] [phone]

## 2019-08-21 ENCOUNTER — Other Ambulatory Visit: Payer: Self-pay | Admitting: *Deleted

## 2019-08-21 ENCOUNTER — Encounter: Payer: Self-pay | Admitting: *Deleted

## 2019-08-21 NOTE — Patient Outreach (Signed)
[place] [facility]) Care Management  08/21/2019  Kristina Wilkinson 02-03-1928 [id number]   CSW was able to make contact with patient today to follow-up regarding social work services and resources, as well as to ensure that patient received her first 14 day supply (out of 28) of meals through [employer], courtesy of [employer].  Patient confirmed receipt, indicating that she is really enjoying the meals.  CSW reminded patient that she will be receiving her second set of 14 meals next week.  CSW encouraged patient to contact CSW directly if she needs additional meal assistance in the future, confirming that patient has the correct contact information for CSW.  CSW also reminded patient that CSW has placed her on the waiting list to receive [employer], through [employer] [facility], and that a representative from [employer] will be contacting her when her name is at the top of the waiting list.  Patient continues to receive home health services through [employer], as well as [facility], also with [facility].  Once again, patient declined the need for assistance with arranging in-home care services or private agency sitters, nor was patient interested in discussing long-term care placement arrangements into an assisted living facility.  CSW will perform a case closure on patient, as all goals of treatment have been met from social work standpoint and no additional social work needs have been identified at this time.  CSW will notify patient's RNCM with [facility], [name] of CSW's plans to close patient's case.  CSW will fax an update to patient's Primary Care Physician, Dr. Clarene Reamer to ensure that she is aware of CSW's involvement with patient's plan of care.  CSW will also send a Physician Case Closure Letter to Dr. Carlean Purl.  Nat Christen, BSW, MSW, LCSW  [facility]  Mailing [place] [address] [place], [place] [address] Physical Address-[address], [place], [place] [address] Toll Free Main # [phone] Fax # [phone] Cell # [phone]  Office # [phone] [email or web address]

## 2019-08-26 ENCOUNTER — Other Ambulatory Visit: Payer: Self-pay | Admitting: *Deleted

## 2019-08-26 NOTE — Patient Outreach (Signed)
[place] [facility]) Care Management  08/26/2019  Kristina Wilkinson 11/17/1927 [id number]   Outreach attempt #2, unsuccessful.    Weekly transition of care call placed to member, no answer.  HIPAA compliant voice message left.  Will send unsuccessful outreach letter and follow up within the next 3-4 business days.  Will also continue collaboration with [facility].  [name], [place], MSN [place] [phone]

## 2019-09-01 ENCOUNTER — Other Ambulatory Visit: Payer: Self-pay | Admitting: *Deleted

## 2019-09-01 NOTE — Patient Outreach (Signed)
[place] [facility]) Care Management  09/01/2019  Kristina Wilkinson August 10, 1927 [id number]   Outreach attempt #3, successful.  Weekly transition of care call placed to member.  She is having a hard time hearing this care manager but report she is doing well.  She still has not seen her PCP yet, not ready to schedule appointment.  Report she is managing her care independently, [facility] remain involved.  She does state she would like to have a phone where she can hear better.  Reminded that [name] from [place] was in the process of obtaining this device and she refused to provide all the financial information needed for the resource.  She state she is still interested, this care manager will follow up with [name] and or [facility] regarding member's request.  Member denies any urgent concerns, will follow up within the next week.  Goals Addressed              This Visit's Progress   .  I want something to help me hear better on the phone (pt-stated)        [name] (see longitudinal plan of care for additional care plan information)  Current Barriers:  . Lacks caregiver support.   Nurse Case Manager Clinical Goal(s):  [facility] Kitchen Over the next 28 days, patient will meet with RN Care Manager to address management of chronic medical conditions . Over the next 31 days, patient will not experience hospital admission. Hospital Admissions in last 6 months = 2 . Over the next 28 days, patient will work with Chief of Staff (community agency) to obtain special phone for hearing  Interventions:  . Inter-disciplinary care team collaboration (see longitudinal plan of care) . Advised patient to contact MD and [facility] with concerns . Collaborated with [facility]  regarding special phone . Discussed plans with patient for ongoing care management follow up and provided patient with direct contact information for care management team  Patient Self Care Activities:   . Performs ADL's independently . Calls provider office for new concerns or questions . Lacks social connections  Initial goal documentation       [name], Therapist, sports, MSN [place] [phone]

## 2019-09-08 ENCOUNTER — Other Ambulatory Visit: Payer: Self-pay | Admitting: *Deleted

## 2019-09-08 NOTE — Patient Outreach (Signed)
[name] [facility]) Care Management  09/08/2019  Kristina Wilkinson April 18, 1927 [id number]   Weekly transition of care call placed to member, no answer. HIPAA compliant voice message left, will follow up within the next 3-4 business days.  [name], [place], MSN [place] [phone]

## 2019-09-11 ENCOUNTER — Other Ambulatory Visit: Payer: Self-pay | Admitting: *Deleted

## 2019-09-11 NOTE — Patient Outreach (Signed)
[place] [facility]) Care Management  09/11/2019  Kristina Wilkinson 02-11-1928 [id number]   Outreach attempt #2, unsuccessful.  Weekly transition of care call attempted, no answer, HIPAA compliant voice message left.  Will send unsuccessful outreach letter and follow up within the next 3-4 business days.  [name], [place], MSN [place] [phone]

## 2019-09-16 ENCOUNTER — Other Ambulatory Visit: Payer: Self-pay | Admitting: *Deleted

## 2019-09-16 NOTE — Patient Outreach (Signed)
[place] [facility]) Care Management  09/16/2019  Kristina Wilkinson [birth year] [id number]   Outreach attempt #3, unsuccessful.  Weekly transition of care call placed to member, no answer, HIPAA compliant voice message left.  Will follow up with [facility] for update and make 4th outreach to member within the next 3 weeks.  [name], [place], MSN [place] [phone]

## 2019-10-08 ENCOUNTER — Other Ambulatory Visit: Payer: Self-pay | Admitting: *Deleted

## 2019-10-08 NOTE — Patient Outreach (Signed)
[place] [facility]) Care Management  10/08/2019  Kristina Wilkinson December 13, 1927 [id number]   Outreach attempt #4, successful.  Call placed to member to follow up on chronic medical conditions.  She report she is doing well, denies any pain or discomfort.  State she has stopped taking her antihypertensives because she feel it was increasing her blood pressure instead of keeping it managed.  Report blood pressure today was 125/60's.  She is only taking the Requip at night.  This care manager inquired about scheduling a follow up with the PCP, she declines stating "I'll go when I'm ready, I'm not ready right now."  At that time, call was disconnected, attempted to call back without success.    Call then placed to [facility] with [facility], she confirms that member has been progressing well, showing ability to care for herself more.  Last home visit was 8/4, she will visit again on 9/6.  This care manager inquired about the phone device that would help with member's hearing.  [name] report she has been in contact with [name] from [place] regarding this ([name] attempted to help member obtain device in the past) and member is now unable to find paperwork for request.  [name] will try to help member locate information during next home visit, no urgent concerns for member.  This care manager will continue to collaborate with [facility].  Will follow up with member within the next month.  Goals Addressed              This Visit's Progress   .  I want something to help me hear better on the phone (pt-stated)   On track     [place] (see longitudinal plan of care for additional care plan information)  Current Barriers:  . Lacks caregiver support.   Nurse Case Manager Clinical Goal(s):  Marland Kitchen Over the next 28 days, patient will meet with RN Care Manager to address management of chronic medical conditions . Over the next 31 days, patient will not experience hospital admission.  Hospital Admissions in last 6 months = 2 . Over the next 28 days, patient will work with Chief of Staff (community agency) to obtain special phone for hearing  Interventions:  . Inter-disciplinary care team collaboration (see longitudinal plan of care) . Advised patient to contact MD and [facility] with concerns . Collaborated with [facility]  regarding special phone . Discussed plans with patient for ongoing care management follow up and provided patient with direct contact information for care management team  Patient Self Care Activities:  . Performs ADL's independently . Calls provider office for new concerns or questions . Lacks social connections  Initial goal documentation       [name], Therapist, sports, MSN [place] [phone]

## 2019-11-06 ENCOUNTER — Other Ambulatory Visit: Payer: Self-pay | Admitting: *Deleted

## 2019-11-06 NOTE — Patient Outreach (Signed)
[place] [facility]) Care Management  11/06/2019  PAULINE PEGUES 1927-11-15 [id number]   Call placed to member to follow up on management of chronic medical conditions.  No answer, unable to leave voice message.  Will follow up within the next 3-4 business days.  [name], [place], MSN [place] [phone]

## 2019-11-12 ENCOUNTER — Other Ambulatory Visit: Payer: Self-pay | Admitting: *Deleted

## 2019-11-12 NOTE — Patient Outreach (Signed)
[facility] [facility]) Care Management  11/12/2019  Kristina Wilkinson October 28, 1927 [id number]   Outreach attempt #2, unsuccessful.    Call placed to member to follow up on management of chronic medical conditions.  No answer, unable to leave voice message.  Will send unsuccessful outreach letter and follow up within the next 2-3 business days.  [name], [place], MSN [name] [phone]

## 2019-11-18 ENCOUNTER — Other Ambulatory Visit: Payer: Self-pay | Admitting: *Deleted

## 2019-11-18 NOTE — Patient Outreach (Signed)
[place] [facility]) Care Management  11/18/2019  TAZIYAH IANNUZZI [birth year] [id number]   She report she is doing well, continues to have Trisha from [facility] coming to visit.  State she was last in the home yesterday.  She denies any urgent medical concerns, denies any recent falls.  Member remains hard of hearing, difficult to manage telephonically.  Will contact [name] to collaborate on plan of care.  Potentially close case if confirmed member is stable due to difficulty maintaining contact and issues with hearing.  Call placed to [facility], notified that member's blood pressure has remained elevated due to noncompliance.  Report member is refusing to take medications as well as refusing to be seen by physician (this care manager has also tried to have member follow up with PCP, she repeatedly refuses).  [name] will follow up with member next week, will possibly close case as well due to noncompliance.  Will await follow up from [facility] next week, anticipate case closure due to inability to progress through plan of care.  [name], [place], MSN [place] [phone]

## 2019-11-19 ENCOUNTER — Telehealth: Payer: Self-pay

## 2019-11-19 NOTE — Telephone Encounter (Signed)
Patient called wanted to talk to you about the Aid that has been sent out to help her. States she can not get her to do anything. She is not helpful at all. I have recommended that she call and let the company know that providers the aid but she wanted to talk to provider first.

## 2019-11-20 NOTE — Telephone Encounter (Signed)
Attempted to call patient. No answer. Will try again at later time.

## 2019-12-10 ENCOUNTER — Other Ambulatory Visit: Payer: Self-pay | Admitting: *Deleted

## 2019-12-10 NOTE — Patient Outreach (Signed)
[place] [facility]) Care Management  12/10/2019  Kristina Wilkinson 1927/08/05 [id number]   Call placed to [place] with [facility] to follow up on member's involvement.  She report member is now discharged due to inability to progress through care plan and noncompliance.  This care manager will follow up with member within the next 2 weeks.  Will plan to close case if she continue to refuse recommendations and active involvement with PCP.  [name], [place], MSN [place] [phone]

## 2019-12-21 ENCOUNTER — Other Ambulatory Visit: Payer: Self-pay | Admitting: *Deleted

## 2019-12-21 NOTE — Patient Outreach (Signed)
[place] [facility]) Care Management  12/21/2019  LIBRA GATZ 11-Oct-1927 [id number]   Call placed to member for follow up on management of care and possible case closure, no answer.  HIPAA complaint voice message left, will follow up within the next 3-4 business days.  [name], [place], MSN [place] [phone]

## 2019-12-24 ENCOUNTER — Other Ambulatory Visit: Payer: Self-pay | Admitting: *Deleted

## 2019-12-24 NOTE — Patient Outreach (Signed)
[place] [facility]) Care Management  12/24/2019  ZOYE CHANDRA 05/08/1927 [id number]   Outreach attempt #2, successful.  Member continues to deny the need for help, state she is "fine by myself."  She confirms that [facility] is no longer involved.  This care manager inquired about scheduling visit with PCP, she refuses, again stating she didn't need anything.  Member has consistently been reluctant with plan of care, will close at this time as she continues to deny need.  [name], [place], MSN [place] [phone]

## 2020-09-25 ENCOUNTER — Emergency Department (HOSPITAL_COMMUNITY): Payer: Medicare Other

## 2020-09-25 ENCOUNTER — Encounter (HOSPITAL_COMMUNITY): Payer: Self-pay | Admitting: Emergency Medicine

## 2020-09-25 ENCOUNTER — Inpatient Hospital Stay (HOSPITAL_COMMUNITY)
Admission: EM | Admit: 2020-09-25 | Discharge: 2020-10-11 | DRG: 309 | Disposition: A | Discharge status: Skilled Nursing Facility | Payer: Medicare Other | Attending: Internal Medicine | Admitting: Internal Medicine

## 2020-09-25 ENCOUNTER — Other Ambulatory Visit: Payer: Self-pay

## 2020-09-25 DIAGNOSIS — R296 Repeated falls: Secondary | ICD-10-CM

## 2020-09-25 DIAGNOSIS — M25512 Pain in left shoulder: Secondary | ICD-10-CM | POA: Diagnosis present

## 2020-09-25 DIAGNOSIS — M19012 Primary osteoarthritis, left shoulder: Secondary | ICD-10-CM | POA: Diagnosis not present

## 2020-09-25 DIAGNOSIS — M1712 Unilateral primary osteoarthritis, left knee: Secondary | ICD-10-CM | POA: Diagnosis not present

## 2020-09-25 DIAGNOSIS — D649 Anemia, unspecified: Secondary | ICD-10-CM | POA: Diagnosis present

## 2020-09-25 DIAGNOSIS — E119 Type 2 diabetes mellitus without complications: Secondary | ICD-10-CM | POA: Diagnosis not present

## 2020-09-25 DIAGNOSIS — R8290 Unspecified abnormal findings in urine: Secondary | ICD-10-CM | POA: Diagnosis present

## 2020-09-25 DIAGNOSIS — M25552 Pain in left hip: Secondary | ICD-10-CM | POA: Diagnosis not present

## 2020-09-25 DIAGNOSIS — Z8673 Personal history of transient ischemic attack (TIA), and cerebral infarction without residual deficits: Secondary | ICD-10-CM

## 2020-09-25 DIAGNOSIS — Z86718 Personal history of other venous thrombosis and embolism: Secondary | ICD-10-CM

## 2020-09-25 DIAGNOSIS — I4891 Unspecified atrial fibrillation: Principal | ICD-10-CM

## 2020-09-25 DIAGNOSIS — W19XXXA Unspecified fall, initial encounter: Secondary | ICD-10-CM | POA: Diagnosis not present

## 2020-09-25 DIAGNOSIS — N390 Urinary tract infection, site not specified: Secondary | ICD-10-CM | POA: Diagnosis present

## 2020-09-25 DIAGNOSIS — Z87891 Personal history of nicotine dependence: Secondary | ICD-10-CM

## 2020-09-25 DIAGNOSIS — Z823 Family history of stroke: Secondary | ICD-10-CM

## 2020-09-25 DIAGNOSIS — Z20822 Contact with and (suspected) exposure to covid-19: Secondary | ICD-10-CM | POA: Diagnosis not present

## 2020-09-25 DIAGNOSIS — Z888 Allergy status to other drugs, medicaments and biological substances status: Secondary | ICD-10-CM

## 2020-09-25 DIAGNOSIS — R0781 Pleurodynia: Secondary | ICD-10-CM | POA: Diagnosis not present

## 2020-09-25 DIAGNOSIS — Z96652 Presence of left artificial knee joint: Secondary | ICD-10-CM | POA: Diagnosis present

## 2020-09-25 DIAGNOSIS — G319 Degenerative disease of nervous system, unspecified: Secondary | ICD-10-CM | POA: Diagnosis not present

## 2020-09-25 DIAGNOSIS — I482 Chronic atrial fibrillation, unspecified: Secondary | ICD-10-CM | POA: Diagnosis present

## 2020-09-25 DIAGNOSIS — K5900 Constipation, unspecified: Secondary | ICD-10-CM | POA: Diagnosis present

## 2020-09-25 DIAGNOSIS — I160 Hypertensive urgency: Secondary | ICD-10-CM | POA: Diagnosis not present

## 2020-09-25 DIAGNOSIS — I700 Atherosclerosis of aorta: Secondary | ICD-10-CM | POA: Diagnosis not present

## 2020-09-25 DIAGNOSIS — E876 Hypokalemia: Secondary | ICD-10-CM | POA: Diagnosis present

## 2020-09-25 DIAGNOSIS — Z8261 Family history of arthritis: Secondary | ICD-10-CM

## 2020-09-25 DIAGNOSIS — I10 Essential (primary) hypertension: Secondary | ICD-10-CM

## 2020-09-25 DIAGNOSIS — D72829 Elevated white blood cell count, unspecified: Secondary | ICD-10-CM | POA: Diagnosis present

## 2020-09-25 DIAGNOSIS — R748 Abnormal levels of other serum enzymes: Secondary | ICD-10-CM | POA: Diagnosis present

## 2020-09-25 DIAGNOSIS — R9082 White matter disease, unspecified: Secondary | ICD-10-CM | POA: Diagnosis not present

## 2020-09-25 DIAGNOSIS — M25562 Pain in left knee: Secondary | ICD-10-CM | POA: Diagnosis present

## 2020-09-25 DIAGNOSIS — S0993XA Unspecified injury of face, initial encounter: Secondary | ICD-10-CM | POA: Diagnosis not present

## 2020-09-25 DIAGNOSIS — Z79899 Other long term (current) drug therapy: Secondary | ICD-10-CM

## 2020-09-25 LAB — BASIC METABOLIC PANEL
Anion gap: 12 (ref 5–15)
BUN: 33 mg/dL — ABNORMAL HIGH (ref 8–23)
CO2: 21 mmol/L — ABNORMAL LOW (ref 22–32)
Calcium: 9.9 mg/dL (ref 8.9–10.3)
Chloride: 108 mmol/L (ref 98–111)
Creatinine, Ser: 1.01 mg/dL — ABNORMAL HIGH (ref 0.44–1.00)
GFR, Estimated: 53 mL/min — ABNORMAL LOW (ref >60)
Glucose, Bld: 177 mg/dL — ABNORMAL HIGH (ref 70–99)
Potassium: 3.3 mmol/L — ABNORMAL LOW (ref 3.5–5.1)
Sodium: 141 mmol/L (ref 135–145)

## 2020-09-25 LAB — CBC
HCT: 35 % — ABNORMAL LOW (ref 36.0–46.0)
Hemoglobin: 11.4 g/dL — ABNORMAL LOW (ref 12.0–15.0)
MCH: 29.8 pg (ref 26.0–34.0)
MCHC: 32.6 g/dL (ref 30.0–36.0)
MCV: 91.6 fL (ref 80.0–100.0)
Platelets: 172 10*3/uL (ref 150–400)
RBC: 3.82 MIL/uL — ABNORMAL LOW (ref 3.87–5.11)
RDW: 12.9 % (ref 11.5–15.5)
WBC: 15.3 10*3/uL — ABNORMAL HIGH (ref 4.0–10.5)
nRBC: 0 % (ref 0.0–0.2)

## 2020-09-25 LAB — TSH: TSH: 0.734 u[IU]/mL (ref 0.350–4.500)

## 2020-09-25 LAB — CK: Total CK: 727 U/L — ABNORMAL HIGH (ref 38–234)

## 2020-09-25 LAB — RESP PANEL BY RT-PCR (FLU A&B, COVID) ARPGX2
Influenza A by PCR: NEGATIVE
Influenza B by PCR: NEGATIVE
SARS Coronavirus 2 by RT PCR: NEGATIVE

## 2020-09-25 LAB — CBG MONITORING, ED
Glucose-Capillary: 160 mg/dL — ABNORMAL HIGH (ref 70–99)
Glucose-Capillary: 151 mg/dL — ABNORMAL HIGH (ref 70–99)
Glucose-Capillary: 154 mg/dL — ABNORMAL HIGH (ref 70–99)
Glucose-Capillary: 137 mg/dL — ABNORMAL HIGH (ref 70–99)

## 2020-09-25 LAB — HEMOGLOBIN A1C
Hgb A1c MFr Bld: 6.1 % — ABNORMAL HIGH (ref 4.8–5.6)
Mean Plasma Glucose: 128.37 mg/dL

## 2020-09-25 LAB — PHOSPHORUS: Phosphorus: 2.2 mg/dL — ABNORMAL LOW (ref 2.5–4.6)

## 2020-09-25 LAB — MAGNESIUM: Magnesium: 1.9 mg/dL (ref 1.7–2.4)

## 2020-09-25 LAB — T4, FREE: Free T4: 1.16 ng/dL — ABNORMAL HIGH (ref 0.61–1.12)

## 2020-09-25 MED ORDER — SODIUM CHLORIDE 0.9 % IV SOLN: INTRAVENOUS | Status: DC

## 2020-09-25 MED ORDER — DILTIAZEM HCL 25 MG/5ML IV SOLN
10.0000 mg | Freq: Once | INTRAVENOUS | Status: AC
  Administered 2020-09-25: 10 mg via INTRAVENOUS
  Filled 2020-09-25: qty 5

## 2020-09-25 MED ORDER — ACETAMINOPHEN 650 MG RE SUPP: 650.0000 mg | Freq: Four times a day (QID) | RECTAL | Status: DC | PRN

## 2020-09-25 MED ORDER — ALBUTEROL SULFATE (2.5 MG/3ML) 0.083% IN NEBU
2.5000 mg | INHALATION_SOLUTION | Freq: Four times a day (QID) | RESPIRATORY_TRACT | Status: DC
  Administered 2020-09-25 (×2): 2.5 mg via RESPIRATORY_TRACT
  Filled 2020-09-25: qty 3

## 2020-09-25 MED ORDER — SODIUM CHLORIDE 0.9 % IV BOLUS
1000.0000 mL | Freq: Once | INTRAVENOUS | Status: AC
  Administered 2020-09-25: 1000 mL via INTRAVENOUS

## 2020-09-25 MED ORDER — METOPROLOL TARTRATE 25 MG PO TABS
25.0000 mg | ORAL_TABLET | Freq: Two times a day (BID) | ORAL | Status: DC
  Administered 2020-09-25 – 2020-09-26 (×4): 25 mg via ORAL
  Filled 2020-09-25 (×4): qty 1

## 2020-09-25 MED ORDER — ONDANSETRON HCL 4 MG/2ML IJ SOLN: 4.0000 mg | Freq: Four times a day (QID) | INTRAMUSCULAR | Status: DC | PRN

## 2020-09-25 MED ORDER — ALBUTEROL SULFATE (2.5 MG/3ML) 0.083% IN NEBU
2.5000 mg | INHALATION_SOLUTION | Freq: Two times a day (BID) | RESPIRATORY_TRACT | Status: DC
  Filled 2020-09-25: qty 3

## 2020-09-25 MED ORDER — ACETAMINOPHEN 325 MG PO TABS
650.0000 mg | ORAL_TABLET | Freq: Four times a day (QID) | ORAL | Status: DC | PRN
  Administered 2020-10-03 – 2020-10-04 (×2): 650 mg via ORAL
  Filled 2020-09-25 (×2): qty 2

## 2020-09-25 MED ORDER — ENOXAPARIN SODIUM 80 MG/0.8ML IJ SOSY
70.0000 mg | PREFILLED_SYRINGE | Freq: Two times a day (BID) | INTRAMUSCULAR | Status: DC
  Administered 2020-09-25 – 2020-09-26 (×3): 70 mg via SUBCUTANEOUS
  Filled 2020-09-25 (×3): qty 0.7

## 2020-09-25 MED ORDER — ONDANSETRON HCL 4 MG PO TABS: 4.0000 mg | ORAL_TABLET | Freq: Four times a day (QID) | ORAL | Status: DC | PRN

## 2020-09-25 MED ORDER — METOPROLOL TARTRATE 5 MG/5ML IV SOLN: 2.5000 mg | Freq: Four times a day (QID) | INTRAVENOUS | Status: AC | PRN

## 2020-09-25 NOTE — Evaluation (Signed)
Physical Therapy Evaluation Patient Details Name: Kristina Wilkinson MRN: [id number] DOB: [birth year] Today's Date: 09/25/2020   History of Present Illness  Pt admitted from home 2* multiple falls, weakness and c/o L knee, L shoulder and hip pain - on imaging no acute findings.  Pt with hx of DM, LBP, L TKR, cerebral aneurysm repair, peripheral neuropathy, and multiple falls -"I call the fire department to get me up"  Clinical Impression  Pt admitted as above and presenting with functional mobility limitations 2* generalized weakness, balance deficits, c/o L knee pain with WB, and questionable safety awareness.  This date, pt able ambulate short distance with RW and assist for balance.  Pt is currently at high risk for falls and would benefit from follow up SNF level rehab to maximize IND and safety prior to return home with limited assist.    Follow Up Recommendations SNF    Equipment Recommendations  None recommended by PT    Recommendations for Other Services       Precautions / Restrictions Precautions Precautions: Fall Restrictions Weight Bearing Restrictions: No      Mobility  Bed Mobility Overal bed mobility: Needs Assistance Bed Mobility: Supine to Sit;Sit to Supine     Supine to sit: Min assist Sit to supine: Min assist;Mod assist   General bed mobility comments: Increaed time with min assist to bring trunk to upright and assist to bring LEs back into bed    Transfers Overall transfer level: Needs assistance Equipment used: Rolling walker (2 wheeled) Transfers: Sit to/from Stand Sit to Stand: Min assist;Mod assist;From elevated surface         General transfer comment: cues for use of UEs to self assist.  Physical assist to bring wt up and fwd and to balance in initial standing at bedside.  Ambulation/Gait Ambulation/Gait assistance: Min assist;+2 physical assistance;+2 safety/equipment Gait Distance (Feet): 7 Feet Assistive device: Rolling walker (2  wheeled) Gait Pattern/deviations: Decreased step length - right;Decreased step length - left;Shuffle;Trunk flexed Gait velocity: decr   General Gait Details: Increased time with cues for posture and position from RW and physical assist for balance and RW management.  Stairs            Wheelchair Mobility    Modified Rankin (Stroke Patients Only)       Balance Overall balance assessment: Needs assistance Sitting-balance support: No upper extremity supported;Feet unsupported Sitting balance-Leahy Scale: Good     Standing balance support: Bilateral upper extremity supported Standing balance-Leahy Scale: Poor                               Pertinent Vitals/Pain Pain Assessment: Faces Faces Pain Scale: Hurts little more Pain Location: L knee Pain Descriptors / Indicators: Grimacing;Sore Pain Intervention(s): Limited activity within patient's tolerance;Monitored during session    [place] expects to be discharged to:: Private residence Living Arrangements: Alone Available Help at Discharge: Family;Friend(s);Available PRN/intermittently Type of Home: House Home Access: Stairs to enter Entrance Stairs-Rails: Right;Left;Can reach both Entrance Stairs-Number of Steps: 3 Home Layout: One level Home Equipment: Cane - single point;Grab bars - tub/shower;Shower seat - built in;Walker - 2 wheels      Prior Function Level of Independence: Independent with assistive device(s)         Comments: Pt reports IND with AD but admits to multiple falls     Hand Dominance   Dominant Hand: Right    Extremity/Trunk Assessment  Upper Extremity Assessment Upper Extremity Assessment: Generalized weakness    Lower Extremity Assessment Lower Extremity Assessment: Generalized weakness    Cervical / Trunk Assessment Cervical / Trunk Assessment: Kyphotic  Communication   Communication: HOH  Cognition Arousal/Alertness: Awake/alert Behavior  During Therapy: WFL for tasks assessed/performed Overall Cognitive Status: Within Functional Limits for tasks assessed                                        General Comments      Exercises     Assessment/Plan    PT Assessment Patient needs continued PT services  PT Problem List Decreased strength;Decreased activity tolerance;Decreased balance;Decreased mobility;Decreased knowledge of use of DME;Pain;Decreased safety awareness       PT Treatment Interventions DME instruction;Gait training;Functional mobility training;Therapeutic activities;Therapeutic exercise;Patient/family education;Balance training    PT Goals (Current goals can be found in the Care Plan section)  Acute Rehab PT Goals Patient Stated Goal: Regain IND PT Goal Formulation: With patient Time For Goal Achievement: 10/09/20 Potential to Achieve Goals: Fair    Frequency Min 2X/week   Barriers to discharge Decreased caregiver support Pt does not have 24/7    Co-evaluation               AM-PAC PT "6 Clicks" Mobility  Outcome Measure Help needed turning from your back to your side while in a flat bed without using bedrails?: A Little Help needed moving from lying on your back to sitting on the side of a flat bed without using bedrails?: A Little Help needed moving to and from a bed to a chair (including a wheelchair)?: A Lot Help needed standing up from a chair using your arms (e.g., wheelchair or bedside chair)?: A Lot Help needed to walk in hospital room?: A Lot Help needed climbing 3-5 steps with a railing? : A Lot 6 Click Score: 14    End of Session Equipment Utilized During Treatment: Gait belt Activity Tolerance: Patient limited by fatigue Patient left: in bed;with call bell/phone within reach Nurse Communication: Mobility status PT Visit Diagnosis: Unsteadiness on feet (R26.81);Muscle weakness (generalized) (M62.81);History of falling (Z91.81)    Time: [id number] PT Time  Calculation (min) (ACUTE ONLY): 23 min   Charges:   PT Evaluation $PT Eval Low Complexity: 1 Low PT Treatments $Gait Training: 8-22 mins        [place] Pager [phone] Office [phone]   [facility] 09/25/2020, 12:39 PM

## 2020-09-25 NOTE — ED Provider Notes (Addendum)
[facility] Emergency Department Provider Note MRN:  [id number]  Arrival date & time: 09/25/20     Chief Complaint   Fall   History of Present Illness   Kristina Wilkinson is a 87 y.o. year-old female with a history of diabetes presenting to the ED with chief complaint of fall.  Patient reports falling multiple times over the past few weeks.  Fell this evening.  Not sure why.  Endorsing pain to the left shoulder, left knee.  Does not think she hit her head.  Pain is mild to moderate, constant, worse with motion or palpation.  Review of Systems  A complete 10 system review of systems was obtained and all systems are negative except as noted in the HPI and PMH.   Patient's Health History    Past Medical History:  Diagnosis Date   Arthritis    CNS aneurysm 05-18-1981   s/p clipping   Diabetes mellitus type II    diet controlled    DVT (deep venous thrombosis) ([place]) 10/15   Elevated glucose    Hypertension    Insomnia    Knee pain    Left Dr. Meyer Cory   LBP (low back pain)    PN (peripheral neuropathy)    unknown etiol    Past Surgical History:  Procedure Laterality Date   CEREBRAL ANEURYSM REPAIR     initial repair in the 1980s, with second surgery in the [place]  May 18, 2009   Left   TOTAL KNEE ARTHROPLASTY     TUBAL LIGATION      Family History  Problem Relation Age of Onset   Arthritis Mother    Stroke Mother    Stroke Father    Arthritis-Osteo Son     Social History   Socioeconomic History   Marital status: Widowed    Spouse name: Not on file   Number of children: 1   Years of education: 40   Highest education level: 12th grade  Occupational History   Occupation: retired    Fish farm manager: RETIRED  Tobacco Use   Smoking status: Former   Smokeless tobacco: Never   Tobacco comments:    quit in [place] Use   Vaping Use: Never used  Substance and Sexual Activity   Alcohol use:  No    Alcohol/week: 0.0 standard drinks   Drug use: No   Sexual activity: Not Currently  Other Topics Concern   Not on file  Social History Narrative   Lives alone in a [place] home.  Husband passed away in 05-19-99.   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   1 son, not local.     Enjoys shopping   Still drives, uses a cane.     Social Determinants of Health   Financial Resource Strain: Not on file  Food Insecurity: Not on file  Transportation Needs: Not on file  Physical Activity: Not on file  Stress: Not on file  Social Connections: Not on file  Intimate Partner Violence: Not on file     Physical Exam   Vitals:   09/25/20 0315  BP: (!) 192/71  Pulse: 90  Resp: 18  Temp: 97.6 F (36.4 C)  SpO2: 99%    CONSTITUTIONAL: Well-appearing, NAD NEURO:  Alert and oriented x 3, no focal deficits EYES:  eyes equal and reactive ENT/NECK:  no LAD, no JVD CARDIO: Regular rate, well-perfused, normal  S1 and S2 PULM:  CTAB no wheezing or rhonchi GI/GU:  normal bowel sounds, non-distended, non-tender MSK/SPINE:  No gross deformities, no edema SKIN:  no rash, atraumatic PSYCH:  Appropriate speech and behavior  *Additional and/or pertinent findings included in MDM below  Diagnostic and Interventional Summary    EKG Interpretation  Date/Time:  Sunday September 25 2020 04:09:04 EDT Ventricular Rate:  84 PR Interval:  217 QRS Duration: 102 QT Interval:  434 QTC Calculation: 514 R Axis:   35 Text Interpretation: Sinus rhythm Borderline prolonged PR interval Borderline T wave abnormalities Prolonged QT interval Confirmed by Gerlene Fee [id number]) on 09/25/2020 5:31:06 AM       Labs Reviewed  CBC - Abnormal; Notable for the following components:      Result Value   WBC 15.3 (*)    RBC 3.82 (*)    Hemoglobin 11.4 (*)    HCT 35.0 (*)    All other components within normal limits  BASIC METABOLIC PANEL - Abnormal; Notable for the following components:   Potassium 3.3  (*)    CO2 21 (*)    Glucose, Bld 177 (*)    BUN 33 (*)    Creatinine, Ser 1.01 (*)    GFR, Estimated 53 (*)    All other components within normal limits  CK - Abnormal; Notable for the following components:   Total CK 727 (*)    All other components within normal limits    CT HEAD WO CONTRAST (5MM)  Final Result    DG Shoulder Left  Final Result    DG Hip Unilat W or Wo Pelvis 2-3 Views Left  Final Result    DG Knee Complete 4 Views Left  Final Result      Medications - No data to display   Procedures  /  Critical Care .Critical Care  Date/Time: 10/14/2020 1:35 AM Performed by: Maudie Flakes, MD Authorized by: Maudie Flakes, MD   Critical care provider statement:    Critical care time (minutes):  33   Critical care was necessary to treat or prevent imminent or life-threatening deterioration of the following conditions: A. fib with RVR.   Critical care was time spent personally by me on the following activities:  Discussions with consultants, evaluation of patient's response to treatment, examination of patient, ordering and performing treatments and interventions, ordering and review of laboratory studies, ordering and review of radiographic studies, pulse oximetry, re-evaluation of patient's condition, obtaining history from patient or surrogate and review of old charts  ED Course and Medical Decision Making  I have reviewed the triage vital signs, the nursing notes, and pertinent available records from the EMR.  Listed above are laboratory and imaging tests that I personally ordered, reviewed, and interpreted and then considered in my medical decision making (see below for details).  Recurrent falls, lives alone, likely needs placement or home health.  Awaiting medical work-up but little concern for emergent traumatic or medical condition.  Differential diagnosis includes arrhythmia, metabolic disarray or electrolyte disturbance, acute fracture.  Seems to have some  underlying dementia, poor historian.  History taking also made difficult by patient being hard of hearing.     Work-up is reassuring, CK is minimally elevated, can be rechecked in the outpatient setting.  Patient to be evaluated by PT and TOC to determine home health versus placement needs.  7 AM update: On reassessment patient is tachycardic between 110 and 130, monitor appears to be new onset A. fib.  This confirmed by twelve-lead EKG.  Providing fluids, diltiazem.  Given this and her CK elevation and her mobility issues will request observation admission.  Barth Kirks. Sedonia Small, [place] [email or web address]  Final Clinical Impressions(s) / ED Diagnoses     ICD-10-CM   1. Fall, initial encounter  W19.[facility]       ED Discharge Orders     None        Discharge Instructions Discussed with and Provided to Patient:   Discharge Instructions   None       Maudie Flakes, MD 09/25/20 [id number]    Maudie Flakes, MD 09/25/20 [id number]    Maudie Flakes, MD 10/14/20 [phone]

## 2020-09-25 NOTE — ED Triage Notes (Addendum)
Patient is A&Ox4  Very hard of hearing She fell on both of her knees. Bruising and L  side rib pain. Been having general weakness. No LOC

## 2020-09-25 NOTE — Progress Notes (Signed)
Pharmacy Note     Order has been placed to obtain pt height and weight in order to dose Enoxaparin order appropriately.    Royetta Asal, PharmD, BCPS 09/25/2020 10:40 AM

## 2020-09-25 NOTE — H&P (Signed)
History and Physical    Kristina Wilkinson [id number] DOB: 08-14-1927 DOA: 09/25/2020  PCP: Pcp, No  Patient coming from:home  I have personally briefly reviewed patient's old medical records in [place]  Chief Complaint: fall with left shoulder, left knee pain   HPI: Kristina Wilkinson is a 87 y.o. 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 Patient s/p fall noted some pain to ribs,and mild HA although she notes she did not his her head. She states that her legs just gave way. She states she has had this issue for some time. Patient states that she lives alone and has a brother that helps her. She denies any chest pain, palpitations, n/v/d/abdominal pain / dysuria, fever/chills, or cough.  Patient was slated for discharge when it was noted that her heart rate spiked  to 130's on evaluation she was noted have irregular rhythm , confirmed by ekg as afib with rvr. Patient was given bolus cardizem with return of hr to 90 but  still noted to be irregular.  Patient diagnosed with new onset afib was slated for admission and further cardiac evaluation.    ED Course: Afeb, bp 192/71, hr 90, rr 18 sat 99%  In addition to noted above: Shoulder xray: IMPRESSION: No acute fracture or dislocation identified about the left shoulder. Hip xray  No acute fracture or dislocation identified about the left hip or pelvis. Knee: 1. No acute fracture or dislocation identified about the left knee. 2. Chronic medial compartment hemiarthroplasty with advanced lateral and patellofemoral compartment degeneration.  Ct head IMPRESSION: 1. No intracranial trauma. 2. RIGHT trauma in the posterior fossa. 3. Atrophy and white matter microvascular disease  Wbc: 15.3, hgb 11.4 at baseline  Na 141, K 3.3, cr 1.01, was  0.7 Ck 727 EKG:  Afib 110  Tx dilt 10 mg iv , ns 1L Covid:  neg  Review of Systems: As per HPI otherwise 10 point review of systems negative.   Past Medical History:  Diagnosis Date   Arthritis    CNS aneurysm 1983   s/p clipping   Diabetes mellitus type II    diet controlled    DVT (deep venous thrombosis) ([facility]) 10/15   Elevated glucose    Hypertension    Insomnia    Knee pain    Left Dr. Meyer Cory   LBP (low back pain)    PN (peripheral neuropathy)    unknown etiol    Past Surgical History:  Procedure Laterality Date   CEREBRAL ANEURYSM REPAIR     initial repair in the 1980s, with second surgery in the [place]  04/2009   Left   TOTAL KNEE ARTHROPLASTY     TUBAL LIGATION       reports that she has quit smoking. She has never used smokeless tobacco. She reports that she does not drink alcohol and does not use drugs.  Allergies  Allergen Reactions   Gabapentin Other (See Comments) and Itching    Made the legs "go numb" Made the legs "go numb"   Other Other (See Comments)    "MD mixed a blood pressure medication with the Clinoril I was taking and I couldn't walk" Other reaction(s): Other (See Comments) "MD mixed a  blood pressure medication with the Clinoril I was taking and I couldn't walk" "Made me achy and sick"   Penicillins Other (See Comments) and Itching    Has patient had a PCN reaction causing immediate rash, facial/tongue/throat swelling, SOB or lightheadedness with hypotension: Unk- exact reaction not recalled, but is allergic Has patient had a PCN reaction causing severe rash involving mucus membranes or skin necrosis: Unk Has patient had a PCN reaction that required hospitalization: Unk Has patient had a PCN reaction occurring within the last 10 years: Yes If all of the above answers are "NO", then may proceed with Cephalosporin use.  Itching and rash   Pregabalin Other (See Comments)     "Made me pass out" Other reaction(s): Other (See Comments) Made limbs go numb   Vit D-Vit E-Safflower Oil Other (See Comments)    "Made me achy and sick"    Family History  Problem Relation Age of Onset   Arthritis Mother    Stroke Mother    Stroke Father    Arthritis-Osteo Son    Prior to Admission medications   Medication Sig Start Date End Date Taking? Authorizing Provider  Amino Acids-Protein Hydrolys (FEEDING SUPPLEMENT, PRO-STAT SUGAR FREE 64,) LIQD Take 30 mLs by mouth 2 (two) times daily. Wound healing Patient not taking: No sig reported    [provider]  diclofenac Sodium (VOLTAREN) 1 % GEL Apply 2 g topically 4 (four) times daily. As needed for knee pain. Patient not taking: No sig reported 06/15/19   Elby Beck, FNP  hydrALAZINE (APRESOLINE) 25 MG tablet TAKE ONE TABLET BY MOUTH EVERY 8 HOURS Patient not taking: No sig reported 06/03/19   Elby Beck, FNP  rOPINIRole (REQUIP) 0.25 MG tablet Take 1 tablet (0.25 mg total) by mouth at bedtime. Patient not taking: Reported on 09/25/2020 04/15/19   Elby Beck, FNP    Physical Exam: Vitals:   09/25/20 0315 09/25/20 0725 09/25/20 0730  BP: (!) 192/71 (!) 163/93 (!) 153/85  Pulse: 90 93 (!) 52  Resp: '18 16 14  '$ Temp: 97.6 F (36.4 C) (!) 97.5 F (36.4 C)   TempSrc: Oral Oral   SpO2: 99% 100% 96%     Vitals:   09/25/20 0315 09/25/20 0725 09/25/20 0730  BP: (!) 192/71 (!) 163/93 (!) 153/85  Pulse: 90 93 (!) 52  Resp: '18 16 14  '$ Temp: 97.6 F (36.4 C) (!) 97.5 F (36.4 C)   TempSrc: Oral Oral   SpO2: 99% 100% 96%  Constitutional: NAD, calm, comfortable Eyes: PERRL, lids and conjunctivae normal ENMT: Mucous membranes are moist. Posterior pharynx clear of any exudate or lesions.Normal dentition.  Neck: normal, supple, no masses, no thyromegaly Respiratory: clear to auscultation bilaterally, no wheezing, no crackles. Normal respiratory effort. No accessory muscle use.  Cardiovascular:  Regular rate and irregular rhythm, no murmurs / rubs / gallops. No extremity edema. 2+ pedal pulses. No carotid bruits.  Abdomen: no tenderness, no masses palpated. No hepatosplenomegaly. Bowel sounds positive.  Musculoskeletal: no clubbing / cyanosis. No joint deformity upper and lower extremities. Good ROM, no contractures. Normal muscle tone.  Skin: no rashes, lesions, ulcers. No induration Neurologic: CN 2-12 grossly intact. Sensation intact, Strength 4/5left, 5/5 right lower ext  , upper extremity 5/5 ( HOH)Psychiatric: Normal judgment and insight. Alert and oriented x 3. Normal mood.    Labs on Admission: I have personally reviewed following labs and imaging studies  CBC: Recent Labs  Lab 09/25/20 0503  WBC 15.3*  HGB 11.4*  HCT 35.0*  MCV 91.6  PLT [id number]   Basic Metabolic Panel: Recent Labs  Lab 09/25/20 0503  NA 141  K 3.3*  CL 108  CO2 21*  GLUCOSE 177*  BUN 33*  CREATININE 1.01*  CALCIUM 9.9   GFR: CrCl cannot be calculated (Unknown ideal weight.). Liver Function Tests: No results for input(s): AST, ALT, ALKPHOS, BILITOT, PROT, ALBUMIN in the last 168 hours. No results for input(s): LIPASE, AMYLASE in the last 168 hours. No results for input(s): AMMONIA in the last 168 hours. Coagulation Profile: No results for input(s): INR, PROTIME in the last 168 hours. Cardiac Enzymes: Recent Labs  Lab 09/25/20 0503  CKTOTAL 727*   BNP (last 3 results) No results for input(s): PROBNP in the last 8760 hours. HbA1C: No results for input(s): HGBA1C in the last 72 hours. CBG: No results for input(s): GLUCAP in the last 168 hours. Lipid Profile: No results for input(s): CHOL, HDL, LDLCALC, TRIG, CHOLHDL, LDLDIRECT in the last 72 hours. Thyroid Function Tests: No results for input(s): TSH, T4TOTAL, FREET4, T3FREE, THYROIDAB in the last 72 hours. Anemia Panel: No results for input(s): VITAMINB12, FOLATE, FERRITIN, TIBC, IRON, RETICCTPCT in the last 72 hours. Urine  analysis:    Component Value Date/Time   COLORURINE YELLOW 03/05/2019 1700   APPEARANCEUR HAZY (A) 03/05/2019 1700   LABSPEC 1.024 03/05/2019 1700   PHURINE 7.0 03/05/2019 1700   GLUCOSEU 150 (A) 03/05/2019 1700   GLUCOSEU NEGATIVE 01/16/2011 0914   HGBUR MODERATE (A) 03/05/2019 1700   BILIRUBINUR NEGATIVE 03/05/2019 1700   BILIRUBINUR neg 05/01/2017 1220   KETONESUR 20 (A) 03/05/2019 1700   PROTEINUR >=300 (A) 03/05/2019 1700   UROBILINOGEN 0.2 05/01/2017 1220   UROBILINOGEN 0.2 04/14/2012 0930   NITRITE POSITIVE (A) 03/05/2019 1700   LEUKOCYTESUR TRACE (A) 03/05/2019 1700    Radiological Exams on Admission: CT HEAD WO CONTRAST (5MM)  Result Date: 09/25/2020 CLINICAL DATA:  Fall.  Facial trauma EXAM: CT HEAD WITHOUT CONTRAST TECHNIQUE: Contiguous axial images were obtained from the base of the skull through the vertex without intravenous contrast. COMPARISON:  None. FINDINGS: Brain: No acute intracranial hemorrhage. No focal mass lesion. No CT evidence of acute infarction. No midline shift or mass effect. No hydrocephalus. Basilar cisterns are patent. Ossific fragment is seen in lower brainstem and LEFT occipital lobe not changed from prior presumed related to prior trauma. There are periventricular and subcortical white matter hypodensities. Generalized cortical atrophy. Vascular: No hyperdense vessel or unexpected calcification. Skull: Prior surgery in the occipital bone midline. Sinuses/Orbits: Paranasal sinuses and mastoid air cells are clear. Orbits are clear. Other: None. IMPRESSION: 1. No intracranial trauma. 2. RIGHT trauma in the posterior fossa. 3. Atrophy and white matter microvascular disease Electronically Signed   By: Suzy Bouchard M.D.   On: 09/25/2020 05:05   DG Shoulder Left  Result Date: 09/25/2020 CLINICAL DATA:  87 year old female status post fall with pain. EXAM: LEFT SHOULDER - 2+ VIEW COMPARISON:  Left shoulder series 11/30/2018. FINDINGS: Three portable views at  0354 hours. Bone mineralization is stable since 2020, within normal limits for age. No glenohumeral joint dislocation. Intact proximal right humerus. Right clavicle and scapula appear intact. Moderate chronic left AC joint degenerative spurring. Negative visible left ribs and chest. Tortuous thoracic aorta with calcified atherosclerosis. IMPRESSION: No acute fracture or dislocation identified about the left shoulder. Aortic Atherosclerosis (ICD10-I70.0). Electronically Signed   By: Genevie Ann M.D.   On: 09/25/2020 04:10   DG Knee Complete  4 Views Left  Result Date: 09/25/2020 CLINICAL DATA:  87 year old female status post fall with pain. EXAM: LEFT KNEE - COMPLETE 4+ VIEW COMPARISON:  Left knee series 11/30/2018. FINDINGS: Chronic medial hemiarthroplasty. Hardware appears stable and intact. No joint effusion on the cross-table lateral view. Superimposed chronic severe patellofemoral and lateral compartment degeneration. Joint space loss and degenerative spurring. No acute osseous abnormality identified. No discrete soft tissue injury. IMPRESSION: 1. No acute fracture or dislocation identified about the left knee. 2. Chronic medial compartment hemiarthroplasty with advanced lateral and patellofemoral compartment degeneration. Electronically Signed   By: Genevie Ann M.D.   On: 09/25/2020 04:14   DG Hip Unilat W or Wo Pelvis 2-3 Views Left  Result Date: 09/25/2020 CLINICAL DATA:  87 year old female status post fall with pain. EXAM: DG HIP (WITH OR WITHOUT PELVIS) 2-3V LEFT COMPARISON:  CT Abdomen and Pelvis 03/05/2019. Pelvis radiograph 11/30/2018. FINDINGS: Osteopenia. Femoral heads remain normally located. Asymmetric enthesophyte ptosis suspected along the caudal right hip joint. Right gluteal dystrophic calcifications on the prior CT now project over the right femoral neck. Pelvis appears stable and intact. Grossly intact proximal right femur. Proximal left femur appears intact. SI joints appear symmetric. No acute  osseous abnormality identified. Negative visible bowel gas pattern. IMPRESSION: No acute fracture or dislocation identified about the left hip or pelvis. Electronically Signed   By: Genevie Ann M.D.   On: 09/25/2020 04:13    EKG: Independently reviewed.see above  Assessment/Plan   New Onset Afib  -admit to tele  -currently rate controlled s/p bolus -ensure electrolytes stable  - echo in am /consider cardiology consult  -start anticoag , further discussion re anticoag insetting of falls  -start metoprolol for rate control    Hypokalemia Hypophosphatemia -replete electrolytes   Recurrent falls due to debility -PT/OT  Leukocytosis - cxr nad, UA pending Elevated CK  -due to fall -ivfs  -repeat CK  in am   Mild AKI -hold nephrotoxic meds  -ivfs /follow labs    DMII  -diet controlled -check a1c  - place on glucose surveillance   Hx of CNS aneurysm s/p clipping  Hx of DVT (5/21)  -s/p eliquis x 3 months  HTN -resume home medications as able    CLBP  OA -supportive care    DVT prophylaxis: lwmh  Code Status: FULL Family Communication:  non at bedside  Disposition Plan: patient  expected to be admitted greater than 2 midnights  Consults called:consider cardiology in am Admission status: obs   Clance Boll MD [facility] If 7PM-7AM, please contact night-coverage [email or web address] Password [email or web address]  09/25/2020, 9:02 AM

## 2020-09-26 ENCOUNTER — Encounter (HOSPITAL_COMMUNITY): Payer: Self-pay | Admitting: Internal Medicine

## 2020-09-26 ENCOUNTER — Observation Stay (HOSPITAL_COMMUNITY): Payer: Medicare Other

## 2020-09-26 DIAGNOSIS — Z9181 History of falling: Secondary | ICD-10-CM | POA: Diagnosis not present

## 2020-09-26 DIAGNOSIS — K5900 Constipation, unspecified: Secondary | ICD-10-CM | POA: Diagnosis present

## 2020-09-26 DIAGNOSIS — W19XXXA Unspecified fall, initial encounter: Secondary | ICD-10-CM | POA: Diagnosis not present

## 2020-09-26 DIAGNOSIS — R079 Chest pain, unspecified: Secondary | ICD-10-CM

## 2020-09-26 DIAGNOSIS — Z8673 Personal history of transient ischemic attack (TIA), and cerebral infarction without residual deficits: Secondary | ICD-10-CM | POA: Diagnosis not present

## 2020-09-26 DIAGNOSIS — M25562 Pain in left knee: Secondary | ICD-10-CM | POA: Diagnosis present

## 2020-09-26 DIAGNOSIS — Z79899 Other long term (current) drug therapy: Secondary | ICD-10-CM | POA: Diagnosis not present

## 2020-09-26 DIAGNOSIS — I160 Hypertensive urgency: Secondary | ICD-10-CM | POA: Diagnosis not present

## 2020-09-26 DIAGNOSIS — H04123 Dry eye syndrome of bilateral lacrimal glands: Secondary | ICD-10-CM | POA: Diagnosis not present

## 2020-09-26 DIAGNOSIS — F0390 Unspecified dementia without behavioral disturbance: Secondary | ICD-10-CM | POA: Diagnosis not present

## 2020-09-26 DIAGNOSIS — W19XXXS Unspecified fall, sequela: Secondary | ICD-10-CM | POA: Diagnosis not present

## 2020-09-26 DIAGNOSIS — I10 Essential (primary) hypertension: Secondary | ICD-10-CM | POA: Diagnosis present

## 2020-09-26 DIAGNOSIS — R7401 Elevation of levels of liver transaminase levels: Secondary | ICD-10-CM | POA: Diagnosis not present

## 2020-09-26 DIAGNOSIS — E876 Hypokalemia: Secondary | ICD-10-CM | POA: Diagnosis present

## 2020-09-26 DIAGNOSIS — D649 Anemia, unspecified: Secondary | ICD-10-CM | POA: Diagnosis present

## 2020-09-26 DIAGNOSIS — Z96652 Presence of left artificial knee joint: Secondary | ICD-10-CM | POA: Diagnosis present

## 2020-09-26 DIAGNOSIS — Z823 Family history of stroke: Secondary | ICD-10-CM | POA: Diagnosis not present

## 2020-09-26 DIAGNOSIS — I959 Hypotension, unspecified: Secondary | ICD-10-CM | POA: Diagnosis not present

## 2020-09-26 DIAGNOSIS — Z7189 Other specified counseling: Secondary | ICD-10-CM | POA: Diagnosis not present

## 2020-09-26 DIAGNOSIS — M1990 Unspecified osteoarthritis, unspecified site: Secondary | ICD-10-CM | POA: Diagnosis not present

## 2020-09-26 DIAGNOSIS — G2581 Restless legs syndrome: Secondary | ICD-10-CM | POA: Diagnosis not present

## 2020-09-26 DIAGNOSIS — E1140 Type 2 diabetes mellitus with diabetic neuropathy, unspecified: Secondary | ICD-10-CM | POA: Diagnosis not present

## 2020-09-26 DIAGNOSIS — Z86718 Personal history of other venous thrombosis and embolism: Secondary | ICD-10-CM | POA: Diagnosis not present

## 2020-09-26 DIAGNOSIS — Z7401 Bed confinement status: Secondary | ICD-10-CM | POA: Diagnosis not present

## 2020-09-26 DIAGNOSIS — I480 Paroxysmal atrial fibrillation: Secondary | ICD-10-CM | POA: Diagnosis not present

## 2020-09-26 DIAGNOSIS — H9190 Unspecified hearing loss, unspecified ear: Secondary | ICD-10-CM | POA: Diagnosis not present

## 2020-09-26 DIAGNOSIS — R0781 Pleurodynia: Secondary | ICD-10-CM | POA: Diagnosis present

## 2020-09-26 DIAGNOSIS — R4182 Altered mental status, unspecified: Secondary | ICD-10-CM | POA: Diagnosis not present

## 2020-09-26 DIAGNOSIS — Z515 Encounter for palliative care: Secondary | ICD-10-CM | POA: Diagnosis not present

## 2020-09-26 DIAGNOSIS — K219 Gastro-esophageal reflux disease without esophagitis: Secondary | ICD-10-CM | POA: Diagnosis not present

## 2020-09-26 DIAGNOSIS — R609 Edema, unspecified: Secondary | ICD-10-CM | POA: Diagnosis not present

## 2020-09-26 DIAGNOSIS — M25512 Pain in left shoulder: Secondary | ICD-10-CM | POA: Diagnosis present

## 2020-09-26 DIAGNOSIS — Z888 Allergy status to other drugs, medicaments and biological substances status: Secondary | ICD-10-CM | POA: Diagnosis not present

## 2020-09-26 DIAGNOSIS — Z8261 Family history of arthritis: Secondary | ICD-10-CM | POA: Diagnosis not present

## 2020-09-26 DIAGNOSIS — N390 Urinary tract infection, site not specified: Secondary | ICD-10-CM | POA: Diagnosis present

## 2020-09-26 DIAGNOSIS — R748 Abnormal levels of other serum enzymes: Secondary | ICD-10-CM | POA: Diagnosis present

## 2020-09-26 DIAGNOSIS — Z87891 Personal history of nicotine dependence: Secondary | ICD-10-CM | POA: Diagnosis not present

## 2020-09-26 DIAGNOSIS — F32A Depression, unspecified: Secondary | ICD-10-CM | POA: Diagnosis not present

## 2020-09-26 DIAGNOSIS — M4800 Spinal stenosis, site unspecified: Secondary | ICD-10-CM | POA: Diagnosis not present

## 2020-09-26 DIAGNOSIS — E119 Type 2 diabetes mellitus without complications: Secondary | ICD-10-CM | POA: Diagnosis present

## 2020-09-26 DIAGNOSIS — R296 Repeated falls: Secondary | ICD-10-CM | POA: Diagnosis present

## 2020-09-26 DIAGNOSIS — I4891 Unspecified atrial fibrillation: Secondary | ICD-10-CM | POA: Diagnosis present

## 2020-09-26 DIAGNOSIS — Z7901 Long term (current) use of anticoagulants: Secondary | ICD-10-CM | POA: Diagnosis not present

## 2020-09-26 DIAGNOSIS — D72829 Elevated white blood cell count, unspecified: Secondary | ICD-10-CM | POA: Diagnosis not present

## 2020-09-26 DIAGNOSIS — R404 Transient alteration of awareness: Secondary | ICD-10-CM | POA: Diagnosis not present

## 2020-09-26 DIAGNOSIS — G4700 Insomnia, unspecified: Secondary | ICD-10-CM | POA: Diagnosis not present

## 2020-09-26 DIAGNOSIS — R001 Bradycardia, unspecified: Secondary | ICD-10-CM | POA: Diagnosis not present

## 2020-09-26 DIAGNOSIS — Z20822 Contact with and (suspected) exposure to covid-19: Secondary | ICD-10-CM | POA: Diagnosis present

## 2020-09-26 DIAGNOSIS — R8290 Unspecified abnormal findings in urine: Secondary | ICD-10-CM | POA: Diagnosis not present

## 2020-09-26 LAB — BASIC METABOLIC PANEL
Anion gap: 6 (ref 5–15)
BUN: 35 mg/dL — ABNORMAL HIGH (ref 8–23)
CO2: 21 mmol/L — ABNORMAL LOW (ref 22–32)
Calcium: 9 mg/dL (ref 8.9–10.3)
Chloride: 113 mmol/L — ABNORMAL HIGH (ref 98–111)
Creatinine, Ser: 0.9 mg/dL (ref 0.44–1.00)
GFR, Estimated: >60 mL/min (ref >60)
Glucose, Bld: 109 mg/dL — ABNORMAL HIGH (ref 70–99)
Potassium: 3 mmol/L — ABNORMAL LOW (ref 3.5–5.1)
Sodium: 140 mmol/L (ref 135–145)

## 2020-09-26 LAB — ECHOCARDIOGRAM COMPLETE
AR max vel: 2.01 cm2
AV Area VTI: 1.73 cm2
AV Area mean vel: 1.95 cm2
AV Mean grad: 5 mmHg
AV Peak grad: 9.4 mmHg
Ao pk vel: 1.53 m/s
Area-P 1/2: 2.98 cm2
Calc EF: 69.4 %
Height: 63 in
MV VTI: 1.3 cm2
S' Lateral: 2.7 cm
Single Plane A2C EF: 71.6 %
Single Plane A4C EF: 65.6 %
Weight: 2400 oz

## 2020-09-26 LAB — URINALYSIS, ROUTINE W REFLEX MICROSCOPIC
Bilirubin Urine: NEGATIVE
Glucose, UA: NEGATIVE mg/dL
Ketones, ur: NEGATIVE mg/dL
Nitrite: POSITIVE — AB
Protein, ur: 30 mg/dL — AB
Specific Gravity, Urine: 1.02 (ref 1.005–1.030)
WBC, UA: >50 WBC/hpf — ABNORMAL HIGH (ref 0–5)
pH: 6 (ref 5.0–8.0)

## 2020-09-26 LAB — CBC
HCT: 29 % — ABNORMAL LOW (ref 36.0–46.0)
Hemoglobin: 9.4 g/dL — ABNORMAL LOW (ref 12.0–15.0)
MCH: 30 pg (ref 26.0–34.0)
MCHC: 32.4 g/dL (ref 30.0–36.0)
MCV: 92.7 fL (ref 80.0–100.0)
Platelets: 149 10*3/uL — ABNORMAL LOW (ref 150–400)
RBC: 3.13 MIL/uL — ABNORMAL LOW (ref 3.87–5.11)
RDW: 13.2 % (ref 11.5–15.5)
WBC: 8.8 10*3/uL (ref 4.0–10.5)
nRBC: 0 % (ref 0.0–0.2)

## 2020-09-26 LAB — PROTIME-INR
INR: 1.2 (ref 0.8–1.2)
Prothrombin Time: 14.9 seconds (ref 11.4–15.2)

## 2020-09-26 LAB — CBG MONITORING, ED: Glucose-Capillary: 118 mg/dL — ABNORMAL HIGH (ref 70–99)

## 2020-09-26 LAB — GLUCOSE, CAPILLARY
Glucose-Capillary: 127 mg/dL — ABNORMAL HIGH (ref 70–99)
Glucose-Capillary: 120 mg/dL — ABNORMAL HIGH (ref 70–99)

## 2020-09-26 LAB — MAGNESIUM: Magnesium: 2 mg/dL (ref 1.7–2.4)

## 2020-09-26 LAB — PHOSPHORUS: Phosphorus: 2.6 mg/dL (ref 2.5–4.6)

## 2020-09-26 MED ORDER — POTASSIUM CHLORIDE CRYS ER 20 MEQ PO TBCR
40.0000 meq | EXTENDED_RELEASE_TABLET | ORAL | Status: AC
  Administered 2020-09-26 (×2): 40 meq via ORAL
  Filled 2020-09-26 (×2): qty 2

## 2020-09-26 MED ORDER — LISINOPRIL 20 MG PO TABS
20.0000 mg | ORAL_TABLET | Freq: Every day | ORAL | Status: DC
  Administered 2020-09-26: 20 mg via ORAL
  Filled 2020-09-26: qty 1

## 2020-09-26 MED ORDER — NITROFURANTOIN MONOHYD MACRO 100 MG PO CAPS: 100.0000 mg | ORAL_CAPSULE | Freq: Two times a day (BID) | ORAL | Status: DC

## 2020-09-26 MED ORDER — APIXABAN 5 MG PO TABS
5.0000 mg | ORAL_TABLET | Freq: Two times a day (BID) | ORAL | Status: DC
  Administered 2020-09-26 – 2020-10-11 (×30): 5 mg via ORAL
  Filled 2020-09-26 (×30): qty 1

## 2020-09-26 MED ORDER — INSULIN ASPART 100 UNIT/ML IJ SOLN
0.0000 [IU] | Freq: Three times a day (TID) | INTRAMUSCULAR | Status: DC
  Administered 2020-09-26 – 2020-09-29 (×4): 1 [IU] via SUBCUTANEOUS
  Administered 2020-09-30 – 2020-10-02 (×3): 2 [IU] via SUBCUTANEOUS
  Administered 2020-10-03 – 2020-10-04 (×2): 1 [IU] via SUBCUTANEOUS
  Administered 2020-10-05: 2 [IU] via SUBCUTANEOUS
  Administered 2020-10-06 – 2020-10-07 (×4): 1 [IU] via SUBCUTANEOUS
  Administered 2020-10-09: 2 [IU] via SUBCUTANEOUS
  Administered 2020-10-09: 1 [IU] via SUBCUTANEOUS
  Administered 2020-10-10: 2 [IU] via SUBCUTANEOUS
  Filled 2020-09-26: qty 0.09

## 2020-09-26 MED ORDER — ALBUTEROL SULFATE (2.5 MG/3ML) 0.083% IN NEBU: 2.5000 mg | INHALATION_SOLUTION | RESPIRATORY_TRACT | Status: DC | PRN

## 2020-09-26 MED ORDER — FOSFOMYCIN TROMETHAMINE 3 G PO PACK
3.0000 g | PACK | Freq: Once | ORAL | Status: AC
  Administered 2020-09-26: 3 g via ORAL
  Filled 2020-09-26: qty 3

## 2020-09-26 NOTE — Progress Notes (Signed)
[place] to [place]  [id number] DOB: 02/28/1927 DOA: 09/25/2020 PCP: Pcp, No     Brief Narrative:  Kristina Wilkinson is a 87 y.o. 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 Patient s/p fall noted some pain to ribs and mild HA although she notes she did not her head. She states that her legs just gave way. She states she has had this issue for some time. Patient states that she lives alone and has a brother that helps her.  Patient was slated for discharge when it was noted that her heart rate spiked to 130's on evaluation she was noted have irregular rhythm, confirmed by EKG as Afib with RVR. Patient was given bolus cardizem with return of HR to 90 but still noted to be irregular.  Patient diagnosed with new onset afib was admitted to the hospital.   New events last 24 hours / Subjective: Patient states that sometimes her left leg feels very heavy which causes her to fall.  She has fallen few times in the past couple of months.  Did not pass out, did not hit her head.  Family friend who is patient's main caregiver is at bedside states that patient seems to be a little bit more confused and is not surprised to hear that she has a UTI.  Assessment & Plan:   Principal Problem:   New onset a-fib ([place]) Active Problems:   HTN (hypertension)   Diabetes mellitus without complication (HCC)   Abnormal urinalysis   Leukocytosis   Falls   New onset A. fib, with RVR -Status post IV cardizem -Echocardiogram with EF 65%, did show mild to mod mitral stenosis  -CHA2DS2-VASc score at least 6.  Discussed with patient and family friend at bedside risk and benefit of anticoagulation.  Patient is at high risk for stroke, I did recommend a blood thinner.  She has tolerated taking Eliquis May 2021 for  DVT which was treated for 58-month.  Discussed risk of bleeding, especially with her falls.  They were in agreement to start anticoagulation in order to prevent stroke -Continue metoprolol  Hypokalemia -Replace, trend  UTI -UA revealed large leukocytes, positive nitrites, many bacteria and >50 WBC -Given fosfomycin   Recurrent falls -Fall precaution -PT OT  Diabetes mellitus type 2, well controlled -Hemoglobin A1c 6.1 -SSI  HTN -Started on lisinopril, metoprolol     DVT prophylaxis:   apixaban (ELIQUIS) tablet 5 mg  Code Status:     Code Status Orders  (From admission, onward)           Start     Ordered   09/25/20 0846  Full code  Continuous        09/25/20 0901           Code Status History     Date Active Date Inactive Code Status Order ID Comments User Context   03/05/2019 2005 03/11/2019 0139 Full Code [id number] PLenore Cordia MD ED   11/30/2018 1521 12/05/2018 1841 Full Code [id number] SNorval Morton MD ED   07/13/2016 2205 07/14/2016 1650 Full Code [id number] OReubin Milan MD Inpatient      Family Communication: Family friend at bedside who is patient's main caregiver.   Disposition Plan:  Status is: Inpatient  Remains inpatient appropriate because:Inpatient level of care appropriate due to severity of illness  Dispo: The patient is from: Home              Anticipated d/c is to: SNF              Patient currently is not medically stable to d/c.   Difficult to place patient No      Consultants:  None  Procedures:  None  Antimicrobials:  Anti-infectives (From admission, onward)    Start     Dose/Rate Route Frequency Ordered Stop   09/26/20 1000  nitrofurantoin (macrocrystal-monohydrate) (MACROBID) capsule 100 mg  Status:  Discontinued        100 mg Oral Every 12 hours 09/26/20 0734 09/26/20 0735   09/26/20 0800  fosfomycin (MONUROL) packet 3 g        3 g Oral  Once 09/26/20 0735 09/26/20 0936         Objective: Vitals:   09/26/20 0303 09/26/20 0600 09/26/20 0900 09/26/20 1200  BP: (!) 165/56 (!) 177/54 (!) 199/58 (!) 160/46  Pulse: (!) 51 62 60 (!) 54  Resp: '18 19 19 19  '$ Temp:      TempSrc:      SpO2: 96% 94% 97% 99%  Weight:      Height:       No intake or output data in the 24 hours ending 09/26/20 1228 Filed Weights   09/25/20 1321  Weight: 68 kg    Examination:  General exam: Appears calm and comfortable  Respiratory system: Clear to auscultation. Respiratory effort normal. No respiratory distress. No conversational dyspnea.  Cardiovascular system: S1 & S2 heard, irreg rhythm, rate 60s but not sustaining Gastrointestinal system: Abdomen is nondistended, soft and nontender. Normal bowel sounds heard. Central nervous system: Alert. No focal neurological deficits. Speech clear.  Extremities: Symmetric in appearance  Skin: No rashes, lesions or ulcers on exposed skin   Data Reviewed: I have personally reviewed following labs and imaging studies  CBC: Recent Labs  Lab 09/25/20 0503 09/26/20 0521  WBC 15.3* 8.8  HGB 11.4* 9.4*  HCT 35.0* 29.0*  MCV 91.6 92.7  PLT 172 123456*   Basic Metabolic Panel: Recent Labs  Lab 09/25/20 0503 09/25/20 1429 09/26/20 0521  NA 141  --  140  K 3.3*  --  3.0*  CL 108  --  113*  CO2 21*  --  21*  GLUCOSE 177*  --  109*  BUN 33*  --  35*  CREATININE 1.01*  --  0.90  CALCIUM 9.9  --  9.0  MG  --  1.9 2.0  PHOS  --  2.2* 2.6   GFR: Estimated Creatinine Clearance: 37.7 mL/min (by C-G formula based on SCr of 0.9 mg/dL). Liver Function Tests: No results for input(s): AST, ALT, ALKPHOS, BILITOT, PROT, ALBUMIN in the last 168 hours. No results for input(s): LIPASE, AMYLASE in the last 168 hours. No results for input(s): AMMONIA in the last 168 hours. Coagulation Profile: Recent Labs  Lab 09/26/20 0521  INR 1.2   Cardiac Enzymes: Recent Labs  Lab 09/25/20 0503  CKTOTAL 727*   BNP (last 3 results) No results for  input(s): PROBNP in the last 8760 hours. HbA1C: Recent Labs    09/25/20 1429  HGBA1C 6.1*   CBG: Recent Labs  Lab 09/25/20 1226 09/25/20 1856 09/25/20 1944 09/25/20 2217 09/26/20 0748  GLUCAP 160* 151* 154* 137* 118*   Lipid Profile: No results for input(s): CHOL,  HDL, LDLCALC, TRIG, CHOLHDL, LDLDIRECT in the last 72 hours. Thyroid Function Tests: Recent Labs    09/25/20 1429  TSH 0.734  FREET4 1.16*   Anemia Panel: No results for input(s): VITAMINB12, FOLATE, FERRITIN, TIBC, IRON, RETICCTPCT in the last 72 hours. Sepsis Labs: No results for input(s): PROCALCITON, LATICACIDVEN in the last 168 hours.  Recent Results (from the past 240 hour(s))  Resp Panel by RT-PCR (Flu A&B, Covid) Nasopharyngeal Swab     Status: None   Collection Time: 09/25/20  7:47 AM   Specimen: Nasopharyngeal Swab; Nasopharyngeal(NP) swabs in vial transport medium  Result Value Ref Range Status   SARS Coronavirus 2 by RT PCR NEGATIVE NEGATIVE Final    Comment: (NOTE) SARS-CoV-2 target nucleic acids are NOT DETECTED.  The SARS-CoV-2 RNA is generally detectable in upper respiratory specimens during the acute phase of infection. The lowest concentration of SARS-CoV-2 viral copies this assay can detect is 138 copies/mL. A negative result does not preclude SARS-Cov-2 infection and should not be used as the sole basis for treatment or other patient management decisions. A negative result may occur with  improper specimen collection/handling, submission of specimen other than nasopharyngeal swab, presence of viral mutation(s) within the areas targeted by this assay, and inadequate number of viral copies(<138 copies/mL). A negative result must be combined with clinical observations, patient history, and epidemiological information. The expected result is Negative.  Fact Sheet for Patients:  EntrepreneurPulse.com.au  Fact Sheet for Healthcare Providers:   IncredibleEmployment.be  This test is no t yet approved or cleared by the [place] FDA and  has been authorized for detection and/or diagnosis of SARS-CoV-2 by FDA under an Emergency Use Authorization (EUA). This EUA will remain  in effect (meaning this test can be used) for the duration of the COVID-19 declaration under Section 564(b)(1) of the Act, 21 U.S.C.section 360bbb-3(b)(1), unless the authorization is terminated  or revoked sooner.       Influenza A by PCR NEGATIVE NEGATIVE Final   Influenza B by PCR NEGATIVE NEGATIVE Final    Comment: (NOTE) The Xpert Xpress SARS-CoV-2/FLU/RSV plus assay is intended as an aid in the diagnosis of influenza from Nasopharyngeal swab specimens and should not be used as a sole basis for treatment. Nasal washings and aspirates are unacceptable for Xpert Xpress SARS-CoV-2/FLU/RSV testing.  Fact Sheet for Patients: EntrepreneurPulse.com.au  Fact Sheet for Healthcare Providers: IncredibleEmployment.be  This test is not yet approved or cleared by the [place] FDA and has been authorized for detection and/or diagnosis of SARS-CoV-2 by FDA under an Emergency Use Authorization (EUA). This EUA will remain in effect (meaning this test can be used) for the duration of the COVID-19 declaration under Section 564(b)(1) of the Act, 21 U.S.C. section 360bbb-3(b)(1), unless the authorization is terminated or revoked.  Performed at [facility], [place] [address], [place], [place] [address]       Radiology Studies: CT HEAD WO CONTRAST (5MM)  Result Date: 09/25/2020 CLINICAL DATA:  Fall.  Facial trauma EXAM: CT HEAD WITHOUT CONTRAST TECHNIQUE: Contiguous axial images were obtained from the base of the skull through the vertex without intravenous contrast. COMPARISON:  None. FINDINGS: Brain: No acute intracranial hemorrhage. No focal mass lesion. No CT evidence of acute  infarction. No midline shift or mass effect. No hydrocephalus. Basilar cisterns are patent. Ossific fragment is seen in lower brainstem and LEFT occipital lobe not changed from prior presumed related to prior trauma. There are periventricular and subcortical white matter hypodensities. Generalized cortical  atrophy. Vascular: No hyperdense vessel or unexpected calcification. Skull: Prior surgery in the occipital bone midline. Sinuses/Orbits: Paranasal sinuses and mastoid air cells are clear. Orbits are clear. Other: None. IMPRESSION: 1. No intracranial trauma. 2. RIGHT trauma in the posterior fossa. 3. Atrophy and white matter microvascular disease Electronically Signed   By: Suzy Bouchard M.D.   On: 09/25/2020 05:05   DG Shoulder Left  Result Date: 09/25/2020 CLINICAL DATA:  87 year old female status post fall with pain. EXAM: LEFT SHOULDER - 2+ VIEW COMPARISON:  Left shoulder series 11/30/2018. FINDINGS: Three portable views at 0354 hours. Bone mineralization is stable since 2020, within normal limits for age. No glenohumeral joint dislocation. Intact proximal right humerus. Right clavicle and scapula appear intact. Moderate chronic left AC joint degenerative spurring. Negative visible left ribs and chest. Tortuous thoracic aorta with calcified atherosclerosis. IMPRESSION: No acute fracture or dislocation identified about the left shoulder. Aortic Atherosclerosis (ICD10-I70.0). Electronically Signed   By: Genevie Ann M.D.   On: 09/25/2020 04:10   DG Knee Complete 4 Views Left  Result Date: 09/25/2020 CLINICAL DATA:  87 year old female status post fall with pain. EXAM: LEFT KNEE - COMPLETE 4+ VIEW COMPARISON:  Left knee series 11/30/2018. FINDINGS: Chronic medial hemiarthroplasty. Hardware appears stable and intact. No joint effusion on the cross-table lateral view. Superimposed chronic severe patellofemoral and lateral compartment degeneration. Joint space loss and degenerative spurring. No acute osseous  abnormality identified. No discrete soft tissue injury. IMPRESSION: 1. No acute fracture or dislocation identified about the left knee. 2. Chronic medial compartment hemiarthroplasty with advanced lateral and patellofemoral compartment degeneration. Electronically Signed   By: Genevie Ann M.D.   On: 09/25/2020 04:14   ECHOCARDIOGRAM COMPLETE  Result Date: 09/26/2020    ECHOCARDIOGRAM REPORT   Patient Name:   CEDRIA STILLING Date of Exam: 09/26/2020 Medical Rec #:  [id number]      Height:       63.0 in Accession #:    [id number]     Weight:       150.0 lb Date of Birth:  1927/08/04       BSA:          49.711 m Patient Age:    52 years       BP:           177/54 mmHg Patient Gender: F              HR:           61 bpm. Exam Location:  Inpatient Procedure: 2D Echo, 3D Echo, Cardiac Doppler and Color Doppler Indications:    R07.9* Chest pain, unspecified  History:        Patient has prior history of Echocardiogram examinations, most                 recent 07/14/2016. Abnormal ECG, Arrythmias:Atrial Fibrillation                 and Bradycardia; Risk Factors:Hypertension, Former Smoker and                 Diabetes.  Sonographer:    Roseanna Rainbow RDCS Referring Phys: [id number] [facility]  Sonographer Comments: Technically difficult study due to poor echo windows. Image transfer delay, no syngo access in ED. IMPRESSIONS  1. The mitral valve is abnormal with prominent mitral annular calcification. Trivial mitral valve regurgitation. Mild to moderate mitral stenosis. Mean gradient of 3 mm Hg at heart rate of 64 but MVA by  continuity equation ~ 1.5 cm2.  2. Left ventricular ejection fraction, by estimation, is 65%. Left ventricular ejection fraction by 3D volume is 61 %. The left ventricle has normal function. The left ventricle has no regional wall motion abnormalities. Left ventricular diastolic parameters are indeterminate.  3. Right ventricular systolic function is normal. The right ventricular size is normal. There is mildly  elevated pulmonary artery systolic pressure. The estimated right ventricular systolic pressure is [id number] mmHg.  4. The aortic valve is tricuspid. There is moderate calcification of the aortic valve. Aortic valve regurgitation is not visualized.  5. The inferior vena cava is dilated in size with <50% respiratory variability, suggesting right atrial pressure of 15 mmHg. Comparison(s): A prior study was performed on 07/14/2016. Prior images reviewed side by side. Increase in mitral valve gradients. FINDINGS  Left Ventricle: Left ventricular ejection fraction, by estimation, is 65%. Left ventricular ejection fraction by 3D volume is 61 %. The left ventricle has normal function. The left ventricle has no regional wall motion abnormalities. The left ventricular internal cavity size was normal in size. There is no left ventricular hypertrophy. Left ventricular diastolic parameters are indeterminate. Right Ventricle: The right ventricular size is normal. No increase in right ventricular wall thickness. Right ventricular systolic function is normal. There is mildly elevated pulmonary artery systolic pressure. The tricuspid regurgitant velocity is 2.59  m/s, and with an assumed right atrial pressure of 15 mmHg, the estimated right ventricular systolic pressure is [id number] mmHg. Left Atrium: Left atrial size was normal in size. Right Atrium: Right atrial size was normal in size. Pericardium: There is no evidence of pericardial effusion. Mitral Valve: The mitral valve is abnormal. Moderate mitral annular calcification. Trivial mitral valve regurgitation. Mild to moderate mitral valve stenosis. MV peak gradient, 9.0 mmHg. The mean mitral valve gradient is 3.0 mmHg with average heart rate of 64 bpm. Tricuspid Valve: The tricuspid valve is normal in structure. Tricuspid valve regurgitation is trivial. Aortic Valve: The aortic valve is tricuspid. There is moderate calcification of the aortic valve. There is mild aortic valve annular  calcification. Aortic valve regurgitation is not visualized. Aortic valve mean gradient measures 5.0 mmHg. Aortic valve peak gradient measures 9.4 mmHg. Aortic valve area, by VTI measures 1.73 cm. Pulmonic Valve: The pulmonic valve was grossly normal. Pulmonic valve regurgitation is not visualized. No evidence of pulmonic stenosis. Aorta: The aortic root and ascending aorta are structurally normal, with no evidence of dilitation. Venous: The inferior vena cava is dilated in size with less than 50% respiratory variability, suggesting right atrial pressure of 15 mmHg. IAS/Shunts: The atrial septum is grossly normal.  LEFT VENTRICLE PLAX 2D LVIDd:         4.60 cm LVIDs:         2.70 cm LV PW:         1.00 cm         3D Volume EF LV IVS:        1.00 cm         LV 3D EF:    Left LVOT diam:     1.80 cm                      ventricul LV SV:         73                           ar LV SV Index:   43  ejection LVOT Area:     2.54 cm                     fraction                                             by 3D                                             volume is LV Volumes (MOD)                            61 %. LV vol d, MOD    58.0 ml A2C: LV vol d, MOD    48.0 ml       3D Volume EF: A4C:                           3D EF:        61 % LV vol s, MOD    16.5 ml       LV EDV:       104 ml A2C:                           LV ESV:       41 ml LV vol s, MOD    16.5 ml       LV SV:        63 ml A4C: LV SV MOD A2C:   41.5 ml LV SV MOD A4C:   48.0 ml LV SV MOD BP:    38.0 ml RIGHT VENTRICLE            IVC RV S prime:     8.16 cm/s  IVC diam: 2.40 cm TAPSE (M-mode): 1.7 cm LEFT ATRIUM             Index       RIGHT ATRIUM           Index LA diam:        4.40 cm 2.57 cm/m  RA Area:     12.40 cm LA Vol (A2C):   52.3 ml 30.56 ml/m RA Volume:   26.50 ml  15.49 ml/m LA Vol (A4C):   50.2 ml 29.34 ml/m LA Biplane Vol: 56.5 ml 33.02 ml/m  AORTIC VALVE AV Area (Vmax):    2.01 cm AV Area (Vmean):   1.95 cm AV Area  (VTI):     1.73 cm AV Vmax:           153.00 cm/s AV Vmean:          107.000 cm/s AV VTI:            0.421 m AV Peak Grad:      9.4 mmHg AV Mean Grad:      5.0 mmHg LVOT Vmax:         121.00 cm/s LVOT Vmean:        82.200 cm/s LVOT VTI:          0.286 m LVOT/AV VTI ratio: 0.68  AORTA Ao Root diam: 2.60 cm Ao Asc diam:  3.50 cm MITRAL  VALVE                TRICUSPID VALVE MV Area (PHT): 2.98 cm     TR Peak grad:   26.8 mmHg MV Area VTI:   1.30 cm     TR Vmax:        259.00 cm/s MV Peak grad:  9.0 mmHg MV Mean grad:  3.0 mmHg     SHUNTS MV Vmax:       1.50 m/s     Systemic VTI:  0.29 m MV Vmean:      72.6 cm/s    Systemic Diam: 1.80 cm MV Decel Time: 255 msec MV E velocity: 138.33 cm/s MV A velocity: 133.00 cm/s MV E/A ratio:  1.04 Rudean Haskell MD Electronically signed by Rudean Haskell MD Signature Date/Time: 09/26/2020/11:03:51 AM    Final    DG Hip Unilat W or Wo Pelvis 2-3 Views Left  Result Date: 09/25/2020 CLINICAL DATA:  87 year old female status post fall with pain. EXAM: DG HIP (WITH OR WITHOUT PELVIS) 2-3V LEFT COMPARISON:  CT Abdomen and Pelvis 03/05/2019. Pelvis radiograph 11/30/2018. FINDINGS: Osteopenia. Femoral heads remain normally located. Asymmetric enthesophyte ptosis suspected along the caudal right hip joint. Right gluteal dystrophic calcifications on the prior CT now project over the right femoral neck. Pelvis appears stable and intact. Grossly intact proximal right femur. Proximal left femur appears intact. SI joints appear symmetric. No acute osseous abnormality identified. Negative visible bowel gas pattern. IMPRESSION: No acute fracture or dislocation identified about the left hip or pelvis. Electronically Signed   By: Genevie Ann M.D.   On: 09/25/2020 04:13      Scheduled Meds:  albuterol  2.5 mg Nebulization BID   apixaban  5 mg Oral BID   metoprolol tartrate  25 mg Oral BID   potassium chloride  40 mEq Oral Q4H   Continuous Infusions:   LOS: 0 days      Time  spent: 35 minutes   Dessa Phi, DO [facility] 09/26/2020, 12:28 PM   Available via Epic secure chat 7am-7pm After these hours, please refer to coverage provider listed on [email or web address]

## 2020-09-26 NOTE — Progress Notes (Signed)
OT Cancellation Note  Patient Details Name: Kristina Wilkinson MRN: [id number] DOB: [birth year]   Cancelled Treatment:    Reason Eval/Treat Not Completed: Patient not medically ready. Patients blood pressure was noted to be elevated at this time. Will continue to follow.  Jackelyn Poling OTR/L, [place] [facility] Office# [phone] Pager# [phone]   [place] 09/26/2020, 10:29 AM

## 2020-09-26 NOTE — ED Notes (Signed)
Pt unable to stand and transfer to wheelchair. Very unstable. Unhappy about using bedpan. Pt cleaned up, brief applied. Linens changed and pt repositioned. Declines oral hydration.

## 2020-09-26 NOTE — Progress Notes (Signed)
  Echocardiogram 2D Echocardiogram has been performed.  Kristina Wilkinson 09/26/2020, 8:44 AM

## 2020-09-26 NOTE — Discharge Instructions (Signed)

## 2020-09-27 DIAGNOSIS — I4891 Unspecified atrial fibrillation: Secondary | ICD-10-CM | POA: Diagnosis not present

## 2020-09-27 LAB — GLUCOSE, CAPILLARY
Glucose-Capillary: 84 mg/dL (ref 70–99)
Glucose-Capillary: 123 mg/dL — ABNORMAL HIGH (ref 70–99)
Glucose-Capillary: 107 mg/dL — ABNORMAL HIGH (ref 70–99)
Glucose-Capillary: 132 mg/dL — ABNORMAL HIGH (ref 70–99)

## 2020-09-27 LAB — CBC
HCT: 30.3 % — ABNORMAL LOW (ref 36.0–46.0)
Hemoglobin: 9.6 g/dL — ABNORMAL LOW (ref 12.0–15.0)
MCH: 30.1 pg (ref 26.0–34.0)
MCHC: 31.7 g/dL (ref 30.0–36.0)
MCV: 95 fL (ref 80.0–100.0)
Platelets: 142 10*3/uL — ABNORMAL LOW (ref 150–400)
RBC: 3.19 MIL/uL — ABNORMAL LOW (ref 3.87–5.11)
RDW: 13.3 % (ref 11.5–15.5)
WBC: 6.5 10*3/uL (ref 4.0–10.5)
nRBC: 0 % (ref 0.0–0.2)

## 2020-09-27 LAB — BASIC METABOLIC PANEL
Anion gap: 4 — ABNORMAL LOW (ref 5–15)
BUN: 28 mg/dL — ABNORMAL HIGH (ref 8–23)
CO2: 20 mmol/L — ABNORMAL LOW (ref 22–32)
Calcium: 8.9 mg/dL (ref 8.9–10.3)
Chloride: 115 mmol/L — ABNORMAL HIGH (ref 98–111)
Creatinine, Ser: 0.75 mg/dL (ref 0.44–1.00)
GFR, Estimated: >60 mL/min (ref >60)
Glucose, Bld: 92 mg/dL (ref 70–99)
Potassium: 4.1 mmol/L (ref 3.5–5.1)
Sodium: 139 mmol/L (ref 135–145)

## 2020-09-27 LAB — MAGNESIUM: Magnesium: 2.1 mg/dL (ref 1.7–2.4)

## 2020-09-27 MED ORDER — LISINOPRIL 20 MG PO TABS
40.0000 mg | ORAL_TABLET | Freq: Every day | ORAL | Status: DC
  Administered 2020-09-27 – 2020-10-11 (×14): 40 mg via ORAL
  Filled 2020-09-27 (×10): qty 2
  Filled 2020-09-27: qty 4
  Filled 2020-09-27: qty 2
  Filled 2020-09-27: qty 4
  Filled 2020-09-27 (×2): qty 2

## 2020-09-27 MED ORDER — METOPROLOL TARTRATE 25 MG PO TABS
12.5000 mg | ORAL_TABLET | Freq: Two times a day (BID) | ORAL | Status: DC
  Administered 2020-09-27: 12.5 mg via ORAL
  Filled 2020-09-27: qty 1

## 2020-09-27 NOTE — TOC Initial Note (Signed)
Transition of Care [facility]) - Initial/Assessment Note    Patient Details  Name: Kristina Wilkinson MRN: [id number] Date of Birth: [birth year]  Transition of Care [facility]) CM/SW Contact:    Ross Ludwig, LCSW Phone Number: 09/27/2020, 5:54 PM  Clinical Narrative:                  Patient lives alone, however she has a brother who checks on her regularly.  PT recommended SNF for patient CSW, has began bed search for SNF placement.  CSW to discuss with patient what facilities are available and if she is interested.  CSW to continue to follow patient's progress throughout discharge planning.  Expected Discharge Plan: Skilled Nursing Facility Barriers to Discharge: Continued Medical Work up   Patient Goals and CMS Choice Patient states their goals for this hospitalization and ongoing recovery are:: To go to SNF for short term rehab, then return back home.      Expected Discharge Plan and Services Expected Discharge Plan: [place] arrangements for the past 2 months: Single Family Home                                      Prior Living Arrangements/Services Living arrangements for the past 2 months: Single Family Home Lives with:: Self Patient language and need for interpreter reviewed:: Yes Do you feel safe going back to the place where you live?: No   Patient needs short term rehab, before returning back home.  Need for Family Participation in Patient Care: Yes (Comment) Care giver support system in place?: No (comment)   Criminal Activity/Legal Involvement Pertinent to Current Situation/Hospitalization: No - Comment as needed  Activities of Daily Living Home Assistive Devices/Equipment: Cane (specify quad or straight), Built-in shower seat, Walker (specify type), Grab bars in shower, Other (Comment), Blood pressure cuff (front wheeled walker, single point cane, tub/shower unit, handicap height toilet) ADL Screening (condition at time of  admission) Patient's cognitive ability adequate to safely complete daily activities?: Yes Is the patient deaf or have difficulty hearing?: Yes ([place]) Does the patient have difficulty seeing, even when wearing glasses/contacts?: No Does the patient have difficulty concentrating, remembering, or making decisions?: No Patient able to express need for assistance with ADLs?: Yes Does the patient have difficulty dressing or bathing?: Yes Independently performs ADLs?: No Communication: Independent Dressing (OT): Needs assistance Is this a change from baseline?: Change from baseline, expected to last >3 days Grooming: Needs assistance Is this a change from baseline?: Change from baseline, expected to last >3 days Feeding: Needs assistance Is this a change from baseline?: Change from baseline, expected to last >3 days Bathing: Needs assistance Is this a change from baseline?: Change from baseline, expected to last >3 days Toileting: Needs assistance Is this a change from baseline?: Change from baseline, expected to last >3days In/Out Bed: Needs assistance Is this a change from baseline?: Change from baseline, expected to last >3 days Walks in Home: Needs assistance Is this a change from baseline?: Change from baseline, expected to last >3 days Does the patient have difficulty walking or climbing stairs?: Yes (secondary to weakness and knee pain) Weakness of Legs: Both Weakness of Arms/Hands: None  Permission Sought/Granted Permission sought to share information with : Case Manager, Family Supports Permission granted to share information with : Yes, Verbal Permission Granted  Share Information with NAME: Brady,Britt Friend  [place] Daughter   [phone]  Britt [place]  Bernette Mayers [phone]  [phone]  Donivan Scull [phone]    Rufina Falco [phone]  Permission granted to share info w AGENCY: SNF  admissions        Emotional Assessment Appearance:: Appears stated age   Affect (typically observed): Appropriate, Calm, Accepting Orientation: : Oriented to Self, Oriented to Place, Oriented to Situation Alcohol / Substance Use: Not Applicable Psych Involvement: No (comment)  Admission diagnosis:  New onset a-fib ([place]) [I48.91] Atrial fibrillation with RVR ([place]) [I48.91] Fall, initial encounter [W19.XXXA] Patient Active Problem List   Diagnosis Date Noted   New onset a-fib ([place]) 09/25/2020   Restless leg syndrome 03/14/2019   Sepsis due to gram-negative UTI ([place]) 03/06/2019   Sepsis due to urinary tract infection ([place]) 03/05/2019   Falls 03/05/2019   Open toe wound 12/01/2018   Traumatic rhabdomyolysis ([place]) 11/30/2018   Cellulitis 11/30/2018   Transaminitis 11/30/2018   Abnormal urinalysis 11/30/2018   Leukocytosis 11/30/2018   Hypokalemia 11/30/2018   Scalp laceration    Skull fracture ([place]) 07/13/2016   Chronic left shoulder pain 04/03/2016   Diabetes mellitus without complication ([place]) 0000000   Hearing loss 06/21/2015   Advance care planning 12/17/2014   Palpitations 07/06/2014   Pain in joint, ankle and foot 04/20/2014   DVT, lower extremity ([place]) 11/19/2013   Edema 10/20/2013   Headache(784.0) 10/20/2013   Left leg swelling 10/20/2013   Aneurysm of basilar artery (HCC) 10/20/2013   Bradycardia 10/20/2013   Vitamin B12 deficiency 07/16/2013   Paresthesia of both feet 05/17/2013   TMJ arthritis 09/11/2012   Ear pain 09/11/2012   History of transfusion of packed red blood cells 09/11/2012   Spinal stenosis 04/13/2011   Well adult exam 01/23/2011   Cholecystitis with cholelithiasis, s/p lap chole 11/10/2010 12/01/2010   Shoulder pain, right 07/25/2010   TOBACCO USE, QUIT 03/09/2009   KNEE PAIN 05/18/2008   PN (peripheral neuropathy) 08/19/2007   LEG PAIN 08/19/2007   Vitamin D deficiency 02/17/2007   INSOMNIA, PERSISTENT 02/17/2007   HTN (hypertension)  02/17/2007   GERD 02/17/2007   Osteoarthritis 02/17/2007   LOW BACK PAIN 02/17/2007   Hyperglycemia 01/28/2007   UNSPECIFIED OSTEOPOROSIS 01/28/2007   PRECIPITOUS DROP IN HEMATOCRIT 01/28/2007   PCP:  Pcp, No Pharmacy:   [facility] - [place], [place] - [address] [place] [address] Phone: [phone] Fax: [phone]     Social Determinants of Health (SDOH) Interventions    Readmission Risk Interventions No flowsheet data found.

## 2020-09-27 NOTE — Progress Notes (Signed)
And up to [place]  [id number] DOB: [birth year] DOA: 09/25/2020 PCP: Pcp, No     Brief Narrative:  Kristina Wilkinson is a 87 y.o. 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 Patient s/p fall noted some pain to ribs and mild HA although she notes she did not her head. She states that her legs just gave way. She states she has had this issue for some time. Patient states that she lives alone and has a brother that helps her.  Patient was slated for discharge when it was noted that her heart rate spiked to 130's on evaluation she was noted have irregular rhythm, confirmed by EKG as Afib with RVR. Patient was given bolus cardizem with return of HR to 90 but still noted to be irregular.  Patient diagnosed with new onset afib was admitted to the hospital.   New events last 24 hours / Subjective: Patient complains of some irritation to bilateral heels, states that they are rubbing against the sheets causing irritation.  Otherwise feeling well this morning, states that she is feeling back to her normal, denies any chest pain, nausea, vomiting.  She has sinus bradycardia on telemetry today.  Assessment & Plan:   Principal Problem:   New onset a-fib ([place]) Active Problems:   HTN (hypertension)   Diabetes mellitus without complication (HCC)   Abnormal urinalysis   Leukocytosis   Falls   New onset A. fib, with RVR -Status post IV cardizem -Echocardiogram with EF 65%, did show mild to mod mitral stenosis  -CHA2DS2-VASc score at least 6.  Discussed with patient and family friend at bedside risk and benefit of anticoagulation.  Patient is at high risk for stroke, I did recommend a blood thinner.  She has tolerated taking Eliquis May 2021 for DVT which was treated for 59-month.  Discussed risk of  bleeding, especially with her falls.  They were in agreement to start anticoagulation in order to prevent stroke -Continue metoprolol, dose lowered in setting of bradycardia  UTI -UA revealed large leukocytes, positive nitrites, many bacteria and >50 WBC -Given fosfomycin   Recurrent falls -Fall precaution -PT OT  Diabetes mellitus type 2, well controlled -Hemoglobin A1c 6.1 -SSI  HTN -Continue lisinopril, metoprolol     DVT prophylaxis:   apixaban (ELIQUIS) tablet 5 mg  Code Status:     Code Status Orders  (From admission, onward)           Start     Ordered   09/25/20 0846  Full code  Continuous        09/25/20 0901           Code Status History     Date Active Date Inactive Code Status Order ID Comments User Context   03/05/2019 2005 03/11/2019 0139 Full Code [id number] PLenore Cordia MD ED   11/30/2018 1521 12/05/2018 1841 Full Code [id number] SNorval Morton MD ED   07/13/2016 2205 07/14/2016 1650 Full Code [id number] OReubin Milan MD Inpatient      Family Communication: None at bedside Disposition Plan:  Status is: Inpatient  Remains inpatient appropriate because:Inpatient level of care appropriate due to severity of illness  Dispo: The patient is from: Home  Anticipated d/c is to: SNF              Patient currently is medically stable to d/c.  Awaiting SNF placement   Difficult to place patient No      Consultants:  None  Procedures:  None  Antimicrobials:  Anti-infectives (From admission, onward)    Start     Dose/Rate Route Frequency Ordered Stop   09/26/20 1000  nitrofurantoin (macrocrystal-monohydrate) (MACROBID) capsule 100 mg  Status:  Discontinued        100 mg Oral Every 12 hours 09/26/20 0734 09/26/20 0735   09/26/20 0800  fosfomycin (MONUROL) packet 3 g        3 g Oral  Once 09/26/20 0735 09/26/20 0936        Objective: Vitals:   09/26/20 1649 09/26/20 2013 09/27/20 0132 09/27/20 0500  BP: (!)  158/62 (!) 168/70 (!) 165/54   Pulse: (!) 59 (!) 58 (!) 59   Resp: '16 18 18   '$ Temp: 97.6 F (36.4 C) 98.4 F (36.9 C) 98.7 F (37.1 C)   TempSrc: Oral  Oral   SpO2: 99% 99% 100%   Weight:    66.2 kg  Height:        Intake/Output Summary (Last 24 hours) at 09/27/2020 1154 Last data filed at 09/27/2020 0556 Gross per 24 hour  Intake --  Output 200 ml  Net -200 ml   Filed Weights   09/25/20 1321 09/27/20 0500  Weight: 68 kg 66.2 kg    Examination: General exam: Appears calm and comfortable, hard of hearing Respiratory system: Clear to auscultation. Respiratory effort normal. Cardiovascular system: S1 & S2 heard, bradycardic rate, regular rhythm. No pedal edema. Gastrointestinal system: Abdomen is nondistended, soft and nontender. Normal bowel sounds heard. Central nervous system: Alert and oriented. Non focal exam. Speech clear  Extremities: Symmetric in appearance bilaterally  Skin: No rashes, lesions or ulcers on exposed skin, mild erythema bilateral shins but no open sores Psychiatry: Stable   Data Reviewed: I have personally reviewed following labs and imaging studies  CBC: Recent Labs  Lab 09/25/20 0503 09/26/20 0521 09/27/20 0405  WBC 15.3* 8.8 6.5  HGB 11.4* 9.4* 9.6*  HCT 35.0* 29.0* 30.3*  MCV 91.6 92.7 95.0  PLT 172 149* 142*    Basic Metabolic Panel: Recent Labs  Lab 09/25/20 0503 09/25/20 1429 09/26/20 0521 09/27/20 0405  NA 141  --  140 139  K 3.3*  --  3.0* 4.1  CL 108  --  113* 115*  CO2 21*  --  21* 20*  GLUCOSE 177*  --  109* 92  BUN 33*  --  35* 28*  CREATININE 1.01*  --  0.90 0.75  CALCIUM 9.9  --  9.0 8.9  MG  --  1.9 2.0 2.1  PHOS  --  2.2* 2.6  --     GFR: Estimated Creatinine Clearance: 41.9 mL/min (by C-G formula based on SCr of 0.75 mg/dL). Liver Function Tests: No results for input(s): AST, ALT, ALKPHOS, BILITOT, PROT, ALBUMIN in the last 168 hours. No results for input(s): LIPASE, AMYLASE in the last 168 hours. No  results for input(s): AMMONIA in the last 168 hours. Coagulation Profile: Recent Labs  Lab 09/26/20 0521  INR 1.2    Cardiac Enzymes: Recent Labs  Lab 09/25/20 0503  CKTOTAL 727*    BNP (last 3 results) No results for input(s): PROBNP in the last 8760 hours. HbA1C: Recent Labs    09/25/20 1429  HGBA1C 6.1*    CBG: Recent Labs  Lab 09/25/20 2217 09/26/20 0748 09/26/20 1739 09/26/20 2004 09/27/20 0754  GLUCAP 137* 118* 127* 120* 84    Lipid Profile: No results for input(s): CHOL, HDL, LDLCALC, TRIG, CHOLHDL, LDLDIRECT in the last 72 hours. Thyroid Function Tests: Recent Labs    09/25/20 1429  TSH 0.734  FREET4 1.16*    Anemia Panel: No results for input(s): VITAMINB12, FOLATE, FERRITIN, TIBC, IRON, RETICCTPCT in the last 72 hours. Sepsis Labs: No results for input(s): PROCALCITON, LATICACIDVEN in the last 168 hours.  Recent Results (from the past 240 hour(s))  Resp Panel by RT-PCR (Flu A&B, Covid) Nasopharyngeal Swab     Status: None   Collection Time: 09/25/20  7:47 AM   Specimen: Nasopharyngeal Swab; Nasopharyngeal(NP) swabs in vial transport medium  Result Value Ref Range Status   SARS Coronavirus 2 by RT PCR NEGATIVE NEGATIVE Final    Comment: (NOTE) SARS-CoV-2 target nucleic acids are NOT DETECTED.  The SARS-CoV-2 RNA is generally detectable in upper respiratory specimens during the acute phase of infection. The lowest concentration of SARS-CoV-2 viral copies this assay can detect is 138 copies/mL. A negative result does not preclude SARS-Cov-2 infection and should not be used as the sole basis for treatment or other patient management decisions. A negative result may occur with  improper specimen collection/handling, submission of specimen other than nasopharyngeal swab, presence of viral mutation(s) within the areas targeted by this assay, and inadequate number of viral copies(<138 copies/mL). A negative result must be combined with clinical  observations, patient history, and epidemiological information. The expected result is Negative.  Fact Sheet for Patients:  EntrepreneurPulse.com.au  Fact Sheet for Healthcare Providers:  IncredibleEmployment.be  This test is no t yet approved or cleared by the [place] FDA and  has been authorized for detection and/or diagnosis of SARS-CoV-2 by FDA under an Emergency Use Authorization (EUA). This EUA will remain  in effect (meaning this test can be used) for the duration of the COVID-19 declaration under Section 564(b)(1) of the Act, 21 U.S.C.section 360bbb-3(b)(1), unless the authorization is terminated  or revoked sooner.       Influenza A by PCR NEGATIVE NEGATIVE Final   Influenza B by PCR NEGATIVE NEGATIVE Final    Comment: (NOTE) The Xpert Xpress SARS-CoV-2/FLU/RSV plus assay is intended as an aid in the diagnosis of influenza from Nasopharyngeal swab specimens and should not be used as a sole basis for treatment. Nasal washings and aspirates are unacceptable for Xpert Xpress SARS-CoV-2/FLU/RSV testing.  Fact Sheet for Patients: EntrepreneurPulse.com.au  Fact Sheet for Healthcare Providers: IncredibleEmployment.be  This test is not yet approved or cleared by the [place] FDA and has been authorized for detection and/or diagnosis of SARS-CoV-2 by FDA under an Emergency Use Authorization (EUA). This EUA will remain in effect (meaning this test can be used) for the duration of the COVID-19 declaration under Section 564(b)(1) of the Act, 21 U.S.C. section 360bbb-3(b)(1), unless the authorization is terminated or revoked.  Performed at [facility], [place] [address] [place], [place] [address]        Radiology Studies: ECHOCARDIOGRAM COMPLETE  Result Date: 09/26/2020    ECHOCARDIOGRAM REPORT   Patient Name:   Kristina Wilkinson Date of Exam: 09/26/2020 Medical Rec #:   [id number]      Height:       63.0 in Accession #:    [id number]     Weight:       150.0  lb Date of Birth:  [birth year]       BSA:          14.711 m Patient Age:    26 years       BP:           177/54 mmHg Patient Gender: F              HR:           61 bpm. Exam Location:  Inpatient Procedure: 2D Echo, 3D Echo, Cardiac Doppler and Color Doppler Indications:    R07.9* Chest pain, unspecified  History:        Patient has prior history of Echocardiogram examinations, most                 recent 07/14/2016. Abnormal ECG, Arrythmias:Atrial Fibrillation                 and Bradycardia; Risk Factors:Hypertension, Former Smoker and                 Diabetes.  Sonographer:    Roseanna Rainbow RDCS Referring Phys: [id number] [facility] A THOMAS  Sonographer Comments: Technically difficult study due to poor echo windows. Image transfer delay, no syngo access in ED. IMPRESSIONS  1. The mitral valve is abnormal with prominent mitral annular calcification. Trivial mitral valve regurgitation. Mild to moderate mitral stenosis. Mean gradient of 3 mm Hg at heart rate of 64 but MVA by continuity equation ~ 1.5 cm2.  2. Left ventricular ejection fraction, by estimation, is 65%. Left ventricular ejection fraction by 3D volume is 61 %. The left ventricle has normal function. The left ventricle has no regional wall motion abnormalities. Left ventricular diastolic parameters are indeterminate.  3. Right ventricular systolic function is normal. The right ventricular size is normal. There is mildly elevated pulmonary artery systolic pressure. The estimated right ventricular systolic pressure is [id number] mmHg.  4. The aortic valve is tricuspid. There is moderate calcification of the aortic valve. Aortic valve regurgitation is not visualized.  5. The inferior vena cava is dilated in size with <50% respiratory variability, suggesting right atrial pressure of 15 mmHg. Comparison(s): A prior study was performed on 07/14/2016. Prior images reviewed side by side.  Increase in mitral valve gradients. FINDINGS  Left Ventricle: Left ventricular ejection fraction, by estimation, is 65%. Left ventricular ejection fraction by 3D volume is 61 %. The left ventricle has normal function. The left ventricle has no regional wall motion abnormalities. The left ventricular internal cavity size was normal in size. There is no left ventricular hypertrophy. Left ventricular diastolic parameters are indeterminate. Right Ventricle: The right ventricular size is normal. No increase in right ventricular wall thickness. Right ventricular systolic function is normal. There is mildly elevated pulmonary artery systolic pressure. The tricuspid regurgitant velocity is 2.59  m/s, and with an assumed right atrial pressure of 15 mmHg, the estimated right ventricular systolic pressure is [id number] mmHg. Left Atrium: Left atrial size was normal in size. Right Atrium: Right atrial size was normal in size. Pericardium: There is no evidence of pericardial effusion. Mitral Valve: The mitral valve is abnormal. Moderate mitral annular calcification. Trivial mitral valve regurgitation. Mild to moderate mitral valve stenosis. MV peak gradient, 9.0 mmHg. The mean mitral valve gradient is 3.0 mmHg with average heart rate of 64 bpm. Tricuspid Valve: The tricuspid valve is normal in structure. Tricuspid valve regurgitation is trivial. Aortic Valve: The aortic valve is tricuspid. There is moderate calcification of the aortic valve. There  is mild aortic valve annular calcification. Aortic valve regurgitation is not visualized. Aortic valve mean gradient measures 5.0 mmHg. Aortic valve peak gradient measures 9.4 mmHg. Aortic valve area, by VTI measures 1.73 cm. Pulmonic Valve: The pulmonic valve was grossly normal. Pulmonic valve regurgitation is not visualized. No evidence of pulmonic stenosis. Aorta: The aortic root and ascending aorta are structurally normal, with no evidence of dilitation. Venous: The inferior vena cava  is dilated in size with less than 50% respiratory variability, suggesting right atrial pressure of 15 mmHg. IAS/Shunts: The atrial septum is grossly normal.  LEFT VENTRICLE PLAX 2D LVIDd:         4.60 cm LVIDs:         2.70 cm LV PW:         1.00 cm         3D Volume EF LV IVS:        1.00 cm         LV 3D EF:    Left LVOT diam:     1.80 cm                      ventricul LV SV:         73                           ar LV SV Index:   43                           ejection LVOT Area:     2.54 cm                     fraction                                             by 3D                                             volume is LV Volumes (MOD)                            61 %. LV vol d, MOD    58.0 ml A2C: LV vol d, MOD    48.0 ml       3D Volume EF: A4C:                           3D EF:        61 % LV vol s, MOD    16.5 ml       LV EDV:       104 ml A2C:                           LV ESV:       41 ml LV vol s, MOD    16.5 ml       LV SV:        63 ml A4C: LV SV MOD A2C:   41.5 ml LV SV MOD A4C:   48.0 ml LV  SV MOD BP:    38.0 ml RIGHT VENTRICLE            IVC RV S prime:     8.16 cm/s  IVC diam: 2.40 cm TAPSE (M-mode): 1.7 cm LEFT ATRIUM             Index       RIGHT ATRIUM           Index LA diam:        4.40 cm 2.57 cm/m  RA Area:     12.40 cm LA Vol (A2C):   52.3 ml 30.56 ml/m RA Volume:   26.50 ml  15.49 ml/m LA Vol (A4C):   50.2 ml 29.34 ml/m LA Biplane Vol: 56.5 ml 33.02 ml/m  AORTIC VALVE AV Area (Vmax):    2.01 cm AV Area (Vmean):   1.95 cm AV Area (VTI):     1.73 cm AV Vmax:           153.00 cm/s AV Vmean:          107.000 cm/s AV VTI:            0.421 m AV Peak Grad:      9.4 mmHg AV Mean Grad:      5.0 mmHg LVOT Vmax:         121.00 cm/s LVOT Vmean:        82.200 cm/s LVOT VTI:          0.286 m LVOT/AV VTI ratio: 0.68  AORTA Ao Root diam: 2.60 cm Ao Asc diam:  3.50 cm MITRAL VALVE                TRICUSPID VALVE MV Area (PHT): 2.98 cm     TR Peak grad:   26.8 mmHg MV Area VTI:   1.30 cm     TR Vmax:         259.00 cm/s MV Peak grad:  9.0 mmHg MV Mean grad:  3.0 mmHg     SHUNTS MV Vmax:       1.50 m/s     Systemic VTI:  0.29 m MV Vmean:      72.6 cm/s    Systemic Diam: 1.80 cm MV Decel Time: 255 msec MV E velocity: 138.33 cm/s MV A velocity: 133.00 cm/s MV E/A ratio:  1.04 Rudean Haskell MD Electronically signed by Rudean Haskell MD Signature Date/Time: 09/26/2020/11:03:51 AM    Final       Scheduled Meds:  apixaban  5 mg Oral BID   insulin aspart  0-9 Units Subcutaneous TID WC   lisinopril  40 mg Oral Daily   metoprolol tartrate  12.5 mg Oral BID   Continuous Infusions:   LOS: 1 day      Time spent: 25 minutes   Dessa Phi, DO [facility] 09/27/2020, 11:54 AM   Available via Epic secure chat 7am-7pm After these hours, please refer to coverage provider listed on [email or web address]

## 2020-09-27 NOTE — Progress Notes (Signed)
Report received from [facility]. Patient sitting up in chair asleep. Patient awakens easily. Pt denies pain, N/V at this time. Will continue to monitor.

## 2020-09-27 NOTE — Plan of Care (Signed)
  Problem: Education: Goal: Knowledge of General Education information will improve Description Including pain rating scale, medication(s)/side effects and non-pharmacologic comfort measures Outcome: Progressing   Problem: Clinical Measurements: Goal: Ability to maintain clinical measurements within normal limits will improve Outcome: Progressing   Problem: Activity: Goal: Risk for activity intolerance will decrease Outcome: Progressing   Problem: Nutrition: Goal: Adequate nutrition will be maintained Outcome: Progressing   Problem: Elimination: Goal: Will not experience complications related to bowel motility Outcome: Progressing   Problem: Pain Managment: Goal: General experience of comfort will improve Outcome: Progressing   

## 2020-09-27 NOTE — [place] FL2 (Signed)
[place] MEDICAID FL2 LEVEL OF CARE SCREENING TOOL     IDENTIFICATION  Patient Name: Kristina Wilkinson Birthdate: 02-25-1927 Sex: female Admission Date (Current Location): 09/25/2020  [facility] and [place] Number:  Herbalist and Address:  [facility],  [place], [place]      Provider Number: [id number]  Attending Physician Name and Address:  Dessa Phi, DO  Relative Name and Phone Number:  Paula Compton   [phone]  [place] Daughter   [phone]  Britt Bottom [phone]  [phone]  Bernette Mayers [phone]  [place] [phone]    Rufina Falco [phone]    Current Level of Care: Hospital Recommended Level of Care: [place] Prior Approval Number:    Date Approved/Denied:   PASRR Number: [id number]  Discharge Plan: SNF    Current Diagnoses: Patient Active Problem List   Diagnosis Date Noted   New onset a-fib ([place]) 09/25/2020   Restless leg syndrome 03/14/2019   Sepsis due to gram-negative UTI ([place]) 03/06/2019   Sepsis due to urinary tract infection ([place]) 03/05/2019   Falls 03/05/2019   Open toe wound 12/01/2018   Traumatic rhabdomyolysis ([place]) 11/30/2018   Cellulitis 11/30/2018   Transaminitis 11/30/2018   Abnormal urinalysis 11/30/2018   Leukocytosis 11/30/2018   Hypokalemia 11/30/2018   Scalp laceration    Skull fracture ([place]) 07/13/2016   Chronic left shoulder pain 04/03/2016   Diabetes mellitus without complication ([place]) 0000000   Hearing loss 06/21/2015   Advance care planning 12/17/2014   Palpitations 07/06/2014   Pain in joint, ankle and foot 04/20/2014   DVT, lower extremity ([place]) 11/19/2013   Edema 10/20/2013   Headache(784.0) 10/20/2013   Left leg swelling 10/20/2013   Aneurysm of basilar artery (HCC) 10/20/2013   Bradycardia 10/20/2013   Vitamin B12 deficiency 07/16/2013   Paresthesia of both feet 05/17/2013   TMJ  arthritis 09/11/2012   Ear pain 09/11/2012   History of transfusion of packed red blood cells 09/11/2012   Spinal stenosis 04/13/2011   Well adult exam 01/23/2011   Cholecystitis with cholelithiasis, s/p lap chole 11/10/2010 12/01/2010   Shoulder pain, right 07/25/2010   TOBACCO USE, QUIT 03/09/2009   KNEE PAIN 05/18/2008   PN (peripheral neuropathy) 08/19/2007   LEG PAIN 08/19/2007   Vitamin D deficiency 02/17/2007   INSOMNIA, PERSISTENT 02/17/2007   HTN (hypertension) 02/17/2007   GERD 02/17/2007   Osteoarthritis 02/17/2007   LOW BACK PAIN 02/17/2007   Hyperglycemia 01/28/2007   UNSPECIFIED OSTEOPOROSIS 01/28/2007   PRECIPITOUS DROP IN HEMATOCRIT 01/28/2007    Orientation RESPIRATION BLADDER Height & Weight     Self, Time, Place  Normal Incontinent Weight: 145 lb 15.1 oz (66.2 kg) Height:  '5\' 3"'$  (160 cm)  BEHAVIORAL SYMPTOMS/MOOD NEUROLOGICAL BOWEL NUTRITION STATUS      Continent Diet (Regular)  AMBULATORY STATUS COMMUNICATION OF NEEDS Skin   Limited Assist Verbally Skin abrasions                       Personal Care Assistance Level of Assistance  Bathing, Feeding, Dressing Bathing Assistance: Limited assistance Feeding assistance: Independent Dressing Assistance: Limited assistance     Functional Limitations Info  Sight, Hearing, Speech Sight Info: Adequate Hearing Info: Adequate Speech Info: Adequate    SPECIAL CARE FACTORS FREQUENCY  PT (By licensed PT), OT (By licensed OT)     PT Frequency: Minimum 5x a week OT Frequency: Minimum 5x a week  Contractures Contractures Info: Not present    Additional Factors Info  Code Status, Allergies, Insulin Sliding Scale Code Status Info: Full Code Allergies Info: Gabapentin   Other   Penicillins   Pregabalin   Vit D-vit E-safflower Oil   Insulin Sliding Scale Info: insulin aspart (novoLOG) injection 0-9 Units 3x a day with meals       Current Medications (09/27/2020):  This is the current  hospital active medication list Current Facility-Administered Medications  Medication Dose Route Frequency Provider Last Rate Last Admin   acetaminophen (TYLENOL) tablet 650 mg  650 mg Oral Q6H PRN Clance Boll, MD       Or   acetaminophen (TYLENOL) suppository 650 mg  650 mg Rectal Q6H PRN Clance Boll, MD       albuterol (PROVENTIL) (2.5 MG/3ML) 0.083% nebulizer solution 2.5 mg  2.5 mg Nebulization Q4H PRN Dessa Phi, DO       apixaban [facility] [place]) tablet 5 mg  5 mg Oral BID Dessa Phi, DO   5 mg at 09/27/20 1200   insulin aspart (novoLOG) injection 0-9 Units  0-9 Units Subcutaneous TID WC Dessa Phi, DO   1 Units at 09/27/20 1248   lisinopril (ZESTRIL) tablet 40 mg  40 mg Oral Daily Dessa Phi, DO   40 mg at 09/27/20 1201   ondansetron (ZOFRAN) tablet 4 mg  4 mg Oral Q6H PRN Clance Boll, MD       Or   ondansetron [facility]) injection 4 mg  4 mg Intravenous Q6H PRN Clance Boll, MD         Discharge Medications: Please see discharge summary for a list of discharge medications.  Relevant Imaging Results:  Relevant Lab Results:   Additional Information SSN [id number]  Ross Ludwig, LCSW

## 2020-09-27 NOTE — Progress Notes (Signed)
Physical Therapy Treatment Patient Details Name: Kristina Wilkinson MRN: [id number] DOB: 1927/08/09 Today's Date: 09/27/2020    History of Present Illness Pt admitted from home 2* multiple falls, weakness and c/o L knee, L shoulder and hip pain - on imaging no acute findings.  Pt with hx of DM, LBP, L TKR, cerebral aneurysm repair, peripheral neuropathy, and multiple falls -"I call the fire department to get me up"    PT Comments    Pt assisted with ambulating however only able to tolerate short distance in her room.  Pt reports falls at home due to LEs "give out."  Continue to recommend SNF upon d/c.    Follow Up Recommendations  SNF     Equipment Recommendations  None recommended by PT    Recommendations for Other Services       Precautions / Restrictions Precautions Precautions: Fall Restrictions Weight Bearing Restrictions: No    Mobility  Bed Mobility Overal bed mobility: Needs Assistance Bed Mobility: Supine to Sit     Supine to sit: Min assist    General bed mobility comments: Increased time with min assist for trunk    Transfers Overall transfer level: Needs assistance Equipment used: Rolling walker (2 wheeled) Transfers: Sit to/from Stand Sit to Stand: Min assist         General transfer comment: verbal cues for hand placement and weight shifting  Ambulation/Gait Ambulation/Gait assistance: Min assist;+2 safety/equipment Gait Distance (Feet): 20 Feet Assistive device: Rolling walker (2 wheeled) Gait Pattern/deviations: Shuffle;Trunk flexed;Decreased stride length Gait velocity: decr   General Gait Details: verbal cues for RW positioning and posture; pt ambulated around room and then to recliner; fatigued quickly   Stairs             Wheelchair Mobility    Modified Rankin (Stroke Patients Only)       Balance Overall balance assessment: History of Falls Sitting-balance support: No upper extremity supported;Feet unsupported Sitting  balance-Leahy Scale: Good     Standing balance support: Bilateral upper extremity supported Standing balance-Leahy Scale: Poor                              Cognition Arousal/Alertness: Awake/alert Behavior During Therapy: WFL for tasks assessed/performed Overall Cognitive Status: No family/caregiver present to determine baseline cognitive functioning                                 General Comments: tangential      Exercises      General Comments General comments (skin integrity, edema, etc.): patient reported wanting to get toe nails cut secondary to them irritating her. nurse was informed at end of session. patients HR during session ranged from 62-71 bpm with activity.      Pertinent Vitals/Pain Pain Assessment: No/denies pain Pain Intervention(s): Monitored during session;Repositioned    Home Living                      Prior Function            PT Goals (current goals can now be found in the care plan section) Acute Rehab PT Goals Patient Stated Goal: to get back home Progress towards PT goals: Progressing toward goals    Frequency    Min 2X/week      PT Plan Current plan remains appropriate    Co-evaluation  AM-PAC PT "6 Clicks" Mobility   Outcome Measure  Help needed turning from your back to your side while in a flat bed without using bedrails?: A Little Help needed moving from lying on your back to sitting on the side of a flat bed without using bedrails?: A Little Help needed moving to and from a bed to a chair (including a wheelchair)?: A Little Help needed standing up from a chair using your arms (e.g., wheelchair or bedside chair)?: A Little Help needed to walk in hospital room?: A Lot Help needed climbing 3-5 steps with a railing? : A Lot 6 Click Score: 16    End of Session Equipment Utilized During Treatment: Gait belt Activity Tolerance: Patient limited by fatigue Patient left: in  chair;with call bell/phone within reach;with chair alarm set Nurse Communication: Mobility status PT Visit Diagnosis: Unsteadiness on feet (R26.81);Muscle weakness (generalized) (M62.81);History of falling (Z91.81)     Time: [id number] PT Time Calculation (min) (ACUTE ONLY): 16 min  Charges:  $Gait Training: 8-22 mins           Jannette Spanner PT, DPT Acute Rehabilitation Services Pager: [phone] Office: [phone]    [place] Ram E 09/27/2020, 4:02 PM

## 2020-09-27 NOTE — Evaluation (Signed)
Occupational Therapy Evaluation Patient Details Name: Kristina Wilkinson MRN: [id number] DOB: May 08, 1927 Today's Date: 09/27/2020    History of Present Illness Pt admitted from home 2* multiple falls, weakness and c/o L knee, L shoulder and hip pain - on imaging no acute findings.  Pt with hx of DM, LBP, L TKR, cerebral aneurysm repair, peripheral neuropathy, and multiple falls -"I call the fire department to get me up"   Clinical Impression   Patient is a 87 year old female who was admitted for above. Patient reported living at home with some help but patient is poor historian. Currently, patient is min A for supine to sit on edge of bed with increased time. Patient required increased redirection to tasks. Patient was unable to follow cues to learn new tasks during this session ( tv remote). Patient was noted to have decreased standing balance and tolerance, decreased endurance, and decreased safety awareness impacting patients ability to participate in ADLs.  Patient Patient would continue to benefit from skilled OT services at this time while admitted and after d/c to address noted deficits in order to improve overall safety and independence in ADLs.      Follow Up Recommendations  SNF    Equipment Recommendations  None recommended by OT    Recommendations for Other Services       Precautions / Restrictions Precautions Precautions: Fall Restrictions Weight Bearing Restrictions: No      Mobility Bed Mobility Overal bed mobility: Needs Assistance Bed Mobility: Supine to Sit;Sit to Supine     Supine to sit: Min assist Sit to supine: Mod assist   General bed mobility comments: Increaed time with min assist to bring trunk to upright and assist to bring LEs back into bed    Transfers Overall transfer level: Needs assistance Equipment used: Rolling walker (2 wheeled) Transfers: Sit to/from Stand Sit to Stand: Min assist;From elevated surface         General transfer comment:  patient required cues for proper placement of UE. physical assistance to shift weight fowards and come into upright positioning    Balance   Sitting-balance support: No upper extremity supported;Feet unsupported Sitting balance-Leahy Scale: Good     Standing balance support: Bilateral upper extremity supported Standing balance-Leahy Scale: Poor                             ADL either performed or assessed with clinical judgement   ADL Overall ADL's : Needs assistance/impaired Eating/Feeding: Set up;Sitting Eating/Feeding Details (indicate cue type and reason): patient has difficulty with opening small containers. patient reported hurting both wrists with fall with patient noted to have no pain with MMT or deficits in ROM of bilateral UE. Grooming: Oral care;Wash/dry face;Set up;Sitting   Upper Body Bathing: Minimal assistance;Sitting   Lower Body Bathing: Moderate assistance;Sit to/from stand   Upper Body Dressing : Minimal assistance;Sitting   Lower Body Dressing: Moderate assistance;Sit to/from Health and safety inspector Details (indicate cue type and reason): patient declined to trial transfer on this date stating that she needed two people to transfer. Toileting- Clothing Manipulation and Hygiene: Maximal assistance;Sit to/from stand         General ADL Comments: patient was min A for sit to stand at edge of bed with min A for taking small steps to head of bed with rolling walker with cues for sequencing and proper hand foot placement.     Vision  Additional Comments: unable to assess with patient easily distracted with attempting to report history of therapy to therapist. patient was challanging to redirect.     Perception     Praxis      Pertinent Vitals/Pain Pain Assessment: Faces Faces Pain Scale: Hurts a little bit Pain Location: toes Pain Descriptors / Indicators: Tender Pain Intervention(s): Repositioned     Hand Dominance Right    Extremity/Trunk Assessment             Communication Communication Communication: HOH   Cognition Arousal/Alertness: Awake/alert Behavior During Therapy: WFL for tasks assessed/performed Overall Cognitive Status: No family/caregiver present to determine baseline cognitive functioning                                 General Comments: patient was oriented to self, and place. patient is a poor historian with conflicting reports during patients reporting. patient reported having no therapy then later in conversation reported she was given too much therapy.   General Comments  patient reported wanting to get toe nails cut secondary to them irritating her. nurse was informed at end of session. patients HR during session ranged from 62-71 bpm with activity.    Exercises     Shoulder Instructions      Home Living Family/patient expects to be discharged to:: Private residence Living Arrangements: Alone Available Help at Discharge: Family;Friend(s);Available PRN/intermittently Type of Home: House Home Access: Stairs to enter [employer] of Steps: 3 Entrance Stairs-Rails: Right;Left;Can reach both Home Layout: One level     Bathroom Shower/Tub: Occupational psychologist: Handicapped height Bathroom Accessibility: Yes   Home Equipment: Cane - single point;Grab bars - tub/shower;Shower seat - built in;Walker - 2 wheels   Additional Comments: this info was collected from chart review. patient was unable to give PLOF. patient is very confused with inability to follow cues consistently.      Prior Functioning/Environment Level of Independence: Independent with assistive device(s)        Comments: Pt reports IND with AD but admits to multiple falls        OT Problem List: Decreased strength;Decreased knowledge of use of DME or AE;Decreased activity tolerance;Decreased cognition;Decreased safety awareness;Impaired balance (sitting and/or  standing)      OT Treatment/Interventions: Self-care/ADL training;Neuromuscular education;Therapeutic exercise;Patient/family education;Balance training;Energy conservation;DME and/or AE instruction;Cognitive remediation/compensation;Therapeutic activities    OT Goals(Current goals can be found in the care plan section) Acute Rehab OT Goals Patient Stated Goal: to get back home OT Goal Formulation: With patient Time For Goal Achievement: 10/11/20 Potential to Achieve Goals: Good  OT Frequency: Min 2X/week   Barriers to D/C:    patient indicated living at home alone. would need to follow up with reliable historian for PLOF       Co-evaluation              AM-PAC OT "6 Clicks" Daily Activity     Outcome Measure Help from another person eating meals?: A Little Help from another person taking care of personal grooming?: A Little Help from another person toileting, which includes using toliet, bedpan, or urinal?: A Lot Help from another person bathing (including washing, rinsing, drying)?: A Lot Help from another person to put on and taking off regular upper body clothing?: A Little Help from another person to put on and taking off regular lower body clothing?: A Lot 6 Click Score: 15   End of Session  Equipment Utilized During Treatment: Surveyor, mining Communication: Other (comment) (participation in therapy)  Activity Tolerance: Patient tolerated treatment well Patient left: in bed;with call bell/phone within reach;Other (comment) (with nurse in room)  OT Visit Diagnosis: Muscle weakness (generalized) (M62.81);History of falling (Z91.81)                Time: [id number] OT Time Calculation (min): 30 min Charges:  OT General Charges $OT Visit: 1 Visit OT Treatments $Self Care/Home Management : 8-22 mins  Jackelyn Poling OTR/L, MS Acute Rehabilitation Department Office# [phone] Pager# [phone]   [place] 09/27/2020, 12:51 PM

## 2020-09-28 DIAGNOSIS — I4891 Unspecified atrial fibrillation: Secondary | ICD-10-CM | POA: Diagnosis not present

## 2020-09-28 LAB — GLUCOSE, CAPILLARY
Glucose-Capillary: 100 mg/dL — ABNORMAL HIGH (ref 70–99)
Glucose-Capillary: 139 mg/dL — ABNORMAL HIGH (ref 70–99)
Glucose-Capillary: 120 mg/dL — ABNORMAL HIGH (ref 70–99)
Glucose-Capillary: 151 mg/dL — ABNORMAL HIGH (ref 70–99)

## 2020-09-28 MED ORDER — METOPROLOL SUCCINATE ER 25 MG PO TB24
12.5000 mg | ORAL_TABLET | Freq: Every day | ORAL | Status: DC
  Administered 2020-09-28 – 2020-10-11 (×13): 12.5 mg via ORAL
  Filled 2020-09-28 (×14): qty 1

## 2020-09-28 MED ORDER — AMLODIPINE BESYLATE 5 MG PO TABS
5.0000 mg | ORAL_TABLET | Freq: Every day | ORAL | Status: DC
  Administered 2020-09-28: 5 mg via ORAL
  Filled 2020-09-28: qty 1

## 2020-09-28 MED ORDER — HYDRALAZINE HCL 20 MG/ML IJ SOLN
10.0000 mg | Freq: Four times a day (QID) | INTRAMUSCULAR | Status: DC | PRN
  Administered 2020-09-29 – 2020-09-30 (×2): 10 mg via INTRAVENOUS
  Filled 2020-09-28 (×2): qty 1

## 2020-09-28 NOTE — Progress Notes (Signed)
PROGRESS NOTE  Kristina Wilkinson  DOB: 07-27-1927  PCP: Pcp, No [id number]  DOA: 09/25/2020  LOS: 2 days  Hospital Day: 4   Chief Complaint  Patient presents with   Fall    Brief narrative: Kristina Wilkinson is a 87 y.o. female with PMH significant for DM2, HTN, debility, recurrent falls, CNS aneurysm s/p clipping, DVT (5/21) s/p eliquis x 3 months who lives alone. Patient presented to the ED from home with complaint of progressive generalized weakness leading to a fall on both her knees leading to bruising and left-sided rib pain.  In the ED, patient was noted to be in A. fib with RVR, started on Cardizem drip and admitted to hospital service. See below for details  Subjective: Patient was seen and examined this morning.  Pleasant elderly Caucasian female.  Propped up in bed.  Not in distress.  Very hard of hearing.  Not on supplemental oxygen.  Assessment/Plan: New onset A. fib with RVR Initially placed on Cardizem drip.  After conversion to sinus rhythm, patient was switched to oral metoprolol.-Stopped because of bradycardia.  Currently her heart rate is in 60s.  I would start her on a low-dose of Toprol at 12.5 mg daily.  Continue to monitor in telemetry. CHADSVASC score at least 6 putting her at high risk of stroke.  Per history, she tolerated a 20-monthcourse of Eliquis in May 2021 for DVT.  Patient agreed to long-term anticoagulation at this time because of A. fib.  Eliquis was started  Hypertensive urgency  Patient's blood pressure seems to be elevated most of the times, as high as 218/65 in last 24 hours. She is currently on lisinopril 40 mg daily and Toprol 12.5 mg daily.  I will add amlodipine 5 mg daily today.  IV hydralazine as needed to continue.  Diabetes mellitus Well-controlled, A1c 6.1.  UTI Urinalysis showed large amount of leukocytes, positive nitrates and many bacteria.  Treated with a dose of fosfomycin.  Generalized weakness/recurrent falls PT eval  obtained.  Mobility: Encourage ambulation Code Status:   Code Status: Full Code  Nutritional status: Body mass index is 25.85 kg/m.     Diet:  Diet Order             Diet heart healthy/carb modified Room service appropriate? Yes; Fluid consistency: Thin  Diet effective now                  DVT prophylaxis:   apixaban (ELIQUIS) tablet 5 mg   Antimicrobials: None Fluid: None Consultants: None Family Communication: Bedside  Status is: Inpatient  Remains inpatient appropriate because: Pending SNF  Dispo: The patient is from: Home              Anticipated d/c is to: Home              Patient currently is medically stable to d/c.   Difficult to place patient No     Infusions:    Scheduled Meds:  amLODipine  5 mg Oral Daily   apixaban  5 mg Oral BID   insulin aspart  0-9 Units Subcutaneous TID WC   lisinopril  40 mg Oral Daily   metoprolol succinate  12.5 mg Oral Daily    Antimicrobials: Anti-infectives (From admission, onward)    Start     Dose/Rate Route Frequency Ordered Stop   09/26/20 1000  nitrofurantoin (macrocrystal-monohydrate) (MACROBID) capsule 100 mg  Status:  Discontinued        100 mg Oral  Every 12 hours 09/26/20 0734 09/26/20 0735   09/26/20 0800  fosfomycin (MONUROL) packet 3 g        3 g Oral  Once 09/26/20 0735 09/26/20 0936       PRN meds: acetaminophen **OR** acetaminophen, albuterol, hydrALAZINE, ondansetron **OR** ondansetron (ZOFRAN) IV   Objective: Vitals:   09/28/20 1241 09/28/20 1357  BP: (!) 179/55 (!) 155/55  Pulse: 64 67  Resp: 18   Temp: 98.2 F (36.8 C)   SpO2: 97%     Intake/Output Summary (Last 24 hours) at 09/28/2020 1417 Last data filed at 09/28/2020 1300 Gross per 24 hour  Intake 680 ml  Output 950 ml  Net -270 ml   Filed Weights   09/25/20 1321 09/27/20 0500 09/28/20 0500  Weight: 68 kg 66.2 kg 66.2 kg   Weight change: 0 kg Body mass index is 25.85 kg/m.   Physical Exam: General exam: Pleasant  elderly Caucasian female.  Not in distress Skin: No rashes, lesions or ulcers. HEENT: Atraumatic, normocephalic, no obvious bleeding Lungs: Clear to auscultation bilaterally CVS: Regular rate and rhythm, no murmur GI/Abd soft, nontender, nondistended, bowel sound present CNS: Alert, awake, oriented x3.  Hard of hearing Psychiatry: Mood appropriate Extremities: No edema, no calf tenderness  Data Review: I have personally reviewed the laboratory data and studies available.  Recent Labs  Lab 09/25/20 0503 09/26/20 0521 09/27/20 0405  WBC 15.3* 8.8 6.5  HGB 11.4* 9.4* 9.6*  HCT 35.0* 29.0* 30.3*  MCV 91.6 92.7 95.0  PLT 172 149* 142*   Recent Labs  Lab 09/25/20 0503 09/25/20 1429 09/26/20 0521 09/27/20 0405  NA 141  --  140 139  K 3.3*  --  3.0* 4.1  CL 108  --  113* 115*  CO2 21*  --  21* 20*  GLUCOSE 177*  --  109* 92  BUN 33*  --  35* 28*  CREATININE 1.01*  --  0.90 0.75  CALCIUM 9.9  --  9.0 8.9  MG  --  1.9 2.0 2.1  PHOS  --  2.2* 2.6  --     F/u labs ordered Unresulted Labs (From admission, onward)    None       Signed, Terrilee Croak, MD [facility] 09/28/2020

## 2020-09-28 NOTE — Progress Notes (Signed)
Pt. sBP > 160 and Terrilee Croak, MD notified.  Will continue to monitor and recheck in 1 hour.

## 2020-09-29 DIAGNOSIS — I4891 Unspecified atrial fibrillation: Secondary | ICD-10-CM | POA: Diagnosis not present

## 2020-09-29 LAB — GLUCOSE, CAPILLARY
Glucose-Capillary: 103 mg/dL — ABNORMAL HIGH (ref 70–99)
Glucose-Capillary: 119 mg/dL — ABNORMAL HIGH (ref 70–99)
Glucose-Capillary: 125 mg/dL — ABNORMAL HIGH (ref 70–99)
Glucose-Capillary: 101 mg/dL — ABNORMAL HIGH (ref 70–99)

## 2020-09-29 MED ORDER — AMLODIPINE BESYLATE 10 MG PO TABS
10.0000 mg | ORAL_TABLET | Freq: Every day | ORAL | Status: DC
  Administered 2020-09-29 – 2020-10-11 (×12): 10 mg via ORAL
  Filled 2020-09-29 (×13): qty 1

## 2020-09-29 NOTE — Care Management Important Message (Signed)
Important Message  Patient Details IM Letter placed in Patient's room. Name: Kristina Wilkinson MRN: [id number] Date of Birth: 10-Jan-1928   Medicare Important Message Given:  Yes     Kerin Salen 09/29/2020, 10:08 AM

## 2020-09-29 NOTE — TOC Progression Note (Signed)
Transition of Care [facility]) - Progression Note    Patient Details  Name: Kristina Wilkinson MRN: [id number] Date of Birth: 1927/11/30  [facility] of [facility]) CM/SW Contact  Ross Ludwig, [place] Phone Number: 09/29/2020, 6:47 PM  Clinical Narrative:    CSW to provide bed offers for SNF placement, CSW to follow up tomorrow.   Expected Discharge Plan: [place] Barriers to Discharge: Continued Medical Work up  Expected Discharge Plan and Services Expected Discharge Plan: [place] arrangements for the past 2 months: Single Family Home                                       Social Determinants of Health (SDOH) Interventions    Readmission Risk Interventions No flowsheet data found.

## 2020-09-29 NOTE — Progress Notes (Signed)
PROGRESS NOTE  Kemper L Wesch  DOB: 07-16-1927  PCP: Pcp, No [id number]  DOA: 09/25/2020  LOS: 3 days  Hospital Day: 5   Chief Complaint  Patient presents with   Fall    Brief narrative: KEBRA HALKER is a 87 y.o. female with PMH significant for DM2, HTN, debility, recurrent falls, CNS aneurysm s/p clipping, DVT (5/21) s/p eliquis x 3 months who lives alone. Patient presented to the ED from home with complaint of progressive generalized weakness leading to a fall on both her knees leading to bruising and left-sided rib pain.  In the ED, patient was noted to be in A. fib with RVR, started on Cardizem drip and admitted to hospital service. See below for details  Subjective: Patient was seen and examined this morning.  Sitting up in chair.  Not in distress.  Noted supplemental oxygen  Assessment/Plan: New onset A. fib with RVR Initially placed on Cardizem drip.  After conversion to sinus rhythm, patient was switched to oral metoprolol.-Stopped because of bradycardia.  Currently her heart rate is in 60s.  I would start her on a low-dose of Toprol at 12.5 mg daily.  Continue to monitor in telemetry. CHADSVASC score at least 6 putting her at high risk of stroke.  Per history, she tolerated a 45-monthcourse of Eliquis in May 2021 for DVT.  Patient agreed to long-term anticoagulation at this time because of A. fib.  Eliquis was started.  Hypertensive urgency  Patient's blood pressure is elevated 253.  Currently on lisinopril 40 mg daily, Toprol 12.5 mg daily.  I had added amlodipine 5 mg daily yesterday.  Increase to 10 mg today.  Continue to monitor.  Diabetes mellitus Well-controlled, A1c 6.1.  UTI Urinalysis showed large amount of leukocytes, positive nitrates and many bacteria.  Treated with a dose of fosfomycin.  Generalized weakness/recurrent falls PT eval obtained.  Mobility: Encourage ambulation Code Status:   Code Status: Full Code  Nutritional status: Body mass  index is 25.89 kg/m.     Diet:  Diet Order             Diet heart healthy/carb modified Room service appropriate? Yes; Fluid consistency: Thin  Diet effective now                  DVT prophylaxis:   apixaban (ELIQUIS) tablet 5 mg   Antimicrobials: None Fluid: None Consultants: None Family Communication: Bedside  Status is: Inpatient  Remains inpatient appropriate because: Pending SNF  Dispo: The patient is from: Home              Anticipated d/c is to: Home              Patient currently is medically stable to d/c.   Difficult to place patient No     Infusions:    Scheduled Meds:  amLODipine  10 mg Oral Daily   apixaban  5 mg Oral BID   insulin aspart  0-9 Units Subcutaneous TID WC   lisinopril  40 mg Oral Daily   metoprolol succinate  12.5 mg Oral Daily    Antimicrobials: Anti-infectives (From admission, onward)    Start     Dose/Rate Route Frequency Ordered Stop   09/26/20 1000  nitrofurantoin (macrocrystal-monohydrate) (MACROBID) capsule 100 mg  Status:  Discontinued        100 mg Oral Every 12 hours 09/26/20 0734 09/26/20 0735   09/26/20 0800  fosfomycin (MONUROL) packet 3 g  3 g Oral  Once 09/26/20 0735 09/26/20 0936       PRN meds: acetaminophen **OR** acetaminophen, albuterol, hydrALAZINE, ondansetron **OR** ondansetron (ZOFRAN) IV   Objective: Vitals:   09/29/20 1100 09/29/20 1205  BP:  (!) 153/56  Pulse:  60  Resp: 18 16  Temp:  98.3 F (36.8 C)  SpO2:  97%    Intake/Output Summary (Last 24 hours) at 09/29/2020 1459 Last data filed at 09/29/2020 0901 Gross per 24 hour  Intake 120 ml  Output --  Net 120 ml    Filed Weights   09/27/20 0500 09/28/20 0500 09/29/20 0500  Weight: 66.2 kg 66.2 kg 66.3 kg   Weight change: 0.1 kg Body mass index is 25.89 kg/m.   Physical Exam: General exam: Pleasant elderly Caucasian female.  Not in distress Skin: No rashes, lesions or ulcers. HEENT: Atraumatic, normocephalic, no obvious  bleeding Lungs: Clear to auscultation bilaterally CVS: Regular rate and rhythm, no murmur GI/Abd soft, nontender, nondistended, bowel sound present CNS: Alert, awake, oriented x3.  Hard of hearing Psychiatry: Mood appropriate Extremities: No edema, no calf tenderness  Data Review: I have personally reviewed the laboratory data and studies available.  Recent Labs  Lab 09/25/20 0503 09/26/20 0521 09/27/20 0405  WBC 15.3* 8.8 6.5  HGB 11.4* 9.4* 9.6*  HCT 35.0* 29.0* 30.3*  MCV 91.6 92.7 95.0  PLT 172 149* 142*    Recent Labs  Lab 09/25/20 0503 09/25/20 1429 09/26/20 0521 09/27/20 0405  NA 141  --  140 139  K 3.3*  --  3.0* 4.1  CL 108  --  113* 115*  CO2 21*  --  21* 20*  GLUCOSE 177*  --  109* 92  BUN 33*  --  35* 28*  CREATININE 1.01*  --  0.90 0.75  CALCIUM 9.9  --  9.0 8.9  MG  --  1.9 2.0 2.1  PHOS  --  2.2* 2.6  --      F/u labs ordered Unresulted Labs (From admission, onward)     Start     Ordered   09/30/20 0500  CBC with Differential/Platelet  Tomorrow morning,   R        09/29/20 0745   09/30/20 [id number]  Basic metabolic panel  Tomorrow morning,   R        09/29/20 0745            Signed, Terrilee Croak, MD [facility] 09/29/2020

## 2020-09-30 DIAGNOSIS — I4891 Unspecified atrial fibrillation: Secondary | ICD-10-CM | POA: Diagnosis not present

## 2020-09-30 LAB — CBC WITH DIFFERENTIAL/PLATELET
Abs Immature Granulocytes: 0.04 10*3/uL (ref 0.00–0.07)
Basophils Absolute: 0 10*3/uL (ref 0.0–0.1)
Basophils Relative: 0 %
Eosinophils Absolute: 0.3 10*3/uL (ref 0.0–0.5)
Eosinophils Relative: 5 %
HCT: 35.7 % — ABNORMAL LOW (ref 36.0–46.0)
Hemoglobin: 11.4 g/dL — ABNORMAL LOW (ref 12.0–15.0)
Immature Granulocytes: 1 %
Lymphocytes Relative: 22 %
Lymphs Abs: 1.5 10*3/uL (ref 0.7–4.0)
MCH: 29.9 pg (ref 26.0–34.0)
MCHC: 31.9 g/dL (ref 30.0–36.0)
MCV: 93.7 fL (ref 80.0–100.0)
Monocytes Absolute: 0.8 10*3/uL (ref 0.1–1.0)
Monocytes Relative: 11 %
Neutro Abs: 4.1 10*3/uL (ref 1.7–7.7)
Neutrophils Relative %: 61 %
Platelets: 163 10*3/uL (ref 150–400)
RBC: 3.81 MIL/uL — ABNORMAL LOW (ref 3.87–5.11)
RDW: 13.2 % (ref 11.5–15.5)
WBC: 6.8 10*3/uL (ref 4.0–10.5)
nRBC: 0 % (ref 0.0–0.2)

## 2020-09-30 LAB — BASIC METABOLIC PANEL
Anion gap: 5 (ref 5–15)
BUN: 19 mg/dL (ref 8–23)
CO2: 24 mmol/L (ref 22–32)
Calcium: 9.3 mg/dL (ref 8.9–10.3)
Chloride: 112 mmol/L — ABNORMAL HIGH (ref 98–111)
Creatinine, Ser: 0.72 mg/dL (ref 0.44–1.00)
GFR, Estimated: >60 mL/min (ref >60)
Glucose, Bld: 100 mg/dL — ABNORMAL HIGH (ref 70–99)
Potassium: 3.6 mmol/L (ref 3.5–5.1)
Sodium: 141 mmol/L (ref 135–145)

## 2020-09-30 LAB — GLUCOSE, CAPILLARY
Glucose-Capillary: 102 mg/dL — ABNORMAL HIGH (ref 70–99)
Glucose-Capillary: 113 mg/dL — ABNORMAL HIGH (ref 70–99)
Glucose-Capillary: 172 mg/dL — ABNORMAL HIGH (ref 70–99)
Glucose-Capillary: 146 mg/dL — ABNORMAL HIGH (ref 70–99)

## 2020-09-30 NOTE — TOC Progression Note (Addendum)
Transition of Care [facility]) - Progression Note    Patient Details  Name: KHANH FELICIA MRN: [id number] Date of Birth: 1927-05-12  Transition of Care [facility] CM/SW Contact  Ross Ludwig, [place] Phone Number: 09/30/2020, 12:55 PM  Clinical Narrative:     12:50pm  CSW spoke to patient to discuss SNF placement and presented bed offers.  She stated she was not sure right now and to check with her later this afternoon or in the morning.  CSW explained to her that physician feels she may be ready for discharge soon and she will have to make a decision.  CSW also explained to her that if she is medically ready the insurance may not pay for her stay anymore.  2:40pm  Patient is hard of hearing, and states she does not want to go a SNF because she does not like them and does not like any of the choices.  CSW told her the only other option is home with home health.  CSW tried talking to her to discuss what home health agency she wanted, and she continued to say she does not want SNF.  CSW explained to her that it was understood, and home health can be set up.  CSW asked if there is any family or friends she wanted CSW to talk to and she said no.    CSW then asked again if she had a preference for a home health agency and explained they would go to her house.  Patient said again she does not want to go to a SNF.  Patient then said stop arguing with me, CSW  informed her we were not arguing, CSW was just trying to find out what agency she wanted, any equipment, if she had a ride home or if she needed EMS transport home.  Patient then stated she does not feel well and does not want to leave today.  CSW expressed understanding, patient then yelled at [place] and asked to leave the room.  Patient then yelled again at [place] saying you do not have any part of this decision, CSW tried explained role of TOC again, and she told this CSW to leave again.    CSW left room and updated attending physician and bedside nurse of  patient's wishes.  CSW requested home health orders for PT, OT, RN, Aide and Social Work.  Patient may need EMS transport home.    Expected Discharge Plan: [place] Barriers to Discharge: Continued Medical Work up  Expected Discharge Plan and Services Expected Discharge Plan: [place] arrangements for the past 2 months: Single Family Home                                       Social Determinants of Health (SDOH) Interventions    Readmission Risk Interventions No flowsheet data found.

## 2020-09-30 NOTE — Progress Notes (Signed)
PROGRESS NOTE  Kristina Wilkinson  DOB: 09/06/1927  PCP: Pcp, No [id number]  DOA: 09/25/2020  LOS: 4 days  Hospital Day: 6   Chief Complaint  Patient presents with   Fall    Brief narrative: Kristina Wilkinson is a 87 y.o. female with PMH significant for DM2, HTN, debility, recurrent falls, CNS aneurysm s/p clipping, DVT (5/21) s/p eliquis x 3 months who lives alone. Patient presented to the ED from home with complaint of progressive generalized weakness leading to a fall on both her knees leading to bruising and left-sided rib pain.  In the ED, patient was noted to be in A. fib with RVR, started on Cardizem drip and admitted to hospital service. See below for details  Subjective: Patient was seen and examined this morning.  Propped up in bed.  Not in distress.  No new symptoms.  Blood pressure controlled. She has been medically stable for discharge for last 3 days.  Assessment/Plan: New onset A. fib with RVR Initially placed on Cardizem drip.  After conversion to sinus rhythm, patient was switched to oral metoprolol.  Currently stable on Toprol at 12.5 mg daily.  Continue to monitor in telemetry. CHADSVASC score at least 6 putting her at high risk of stroke.  Per history, she tolerated a 79-monthcourse of Eliquis in May 2021 for DVT.  Patient agreed to long-term anticoagulation at this time because of A. fib.  Eliquis was started.  Hypertensive urgency  Patient's blood pressure is elevated 253.  Currently blood pressure is controlled on Toprol 12.5 mg daily, lisinopril 40 mg daily and amlodipine 10 mg daily.  Continue to monitor  Diabetes mellitus Well-controlled, A1c 6.1.  UTI Urinalysis showed large amount of leukocytes, positive nitrates and many bacteria.  Treated with a dose of fosfomycin.  Generalized weakness/recurrent falls PT eval obtained.  Mobility: Encourage ambulation Code Status:   Code Status: Full Code  Nutritional status: Body mass index is 25.93 kg/m.      Diet:  Diet Order             Diet heart healthy/carb modified Room service appropriate? Yes; Fluid consistency: Thin  Diet effective now                  DVT prophylaxis:   apixaban (ELIQUIS) tablet 5 mg   Antimicrobials: None Fluid: None Consultants: None Family Communication: Bedside  Status is: Inpatient  Remains inpatient appropriate because: Pending SNF  Dispo: The patient is from: Home              Anticipated d/c is to: SNF.              Patient currently is medically stable to d/c.   Difficult to place patient No     Infusions:    Scheduled Meds:  amLODipine  10 mg Oral Daily   apixaban  5 mg Oral BID   insulin aspart  0-9 Units Subcutaneous TID WC   lisinopril  40 mg Oral Daily   metoprolol succinate  12.5 mg Oral Daily    Antimicrobials: Anti-infectives (From admission, onward)    Start     Dose/Rate Route Frequency Ordered Stop   09/26/20 1000  nitrofurantoin (macrocrystal-monohydrate) (MACROBID) capsule 100 mg  Status:  Discontinued        100 mg Oral Every 12 hours 09/26/20 0734 09/26/20 0735   09/26/20 0800  fosfomycin (MONUROL) packet 3 g        3 g Oral  Once 09/26/20  [id number] 09/26/20 0936       PRN meds: acetaminophen **OR** acetaminophen, albuterol, hydrALAZINE, ondansetron **OR** ondansetron (ZOFRAN) IV   Objective: Vitals:   09/30/20 0928 09/30/20 1000  BP: (!) 155/62 113/62  Pulse: 66 67  Resp: 16   Temp: 97.7 F (36.5 C) 98.1 F (36.7 C)  SpO2: 98% 96%    Intake/Output Summary (Last 24 hours) at 09/30/2020 1346 Last data filed at 09/30/2020 0900 Gross per 24 hour  Intake 300 ml  Output 400 ml  Net -100 ml    Filed Weights   09/28/20 0500 09/29/20 0500 09/30/20 0500  Weight: 66.2 kg 66.3 kg 66.4 kg   Weight change: 0.1 kg Body mass index is 25.93 kg/m.   Physical Exam: General exam: Pleasant elderly Caucasian female.  No new symptoms.  Not in distress Skin: No rashes, lesions or ulcers. HEENT: Atraumatic,  normocephalic, no obvious bleeding Lungs: Clear to auscultation bilaterally CVS: Regular rate and rhythm, no murmur GI/Abd soft, nontender, nondistended, bowel sound present CNS: Alert, awake, oriented x3.  Hard of hearing Psychiatry: Mood appropriate Extremities: No edema, no calf tenderness  Data Review: I have personally reviewed the laboratory data and studies available.  Recent Labs  Lab 09/25/20 0503 09/26/20 0521 09/27/20 0405 09/30/20 0511  WBC 15.3* 8.8 6.5 6.8  NEUTROABS  --   --   --  4.1  HGB 11.4* 9.4* 9.6* 11.4*  HCT 35.0* 29.0* 30.3* 35.7*  MCV 91.6 92.7 95.0 93.7  PLT 172 149* 142* 163    Recent Labs  Lab 09/25/20 0503 09/25/20 1429 09/26/20 0521 09/27/20 0405 09/30/20 0511  NA 141  --  140 139 141  K 3.3*  --  3.0* 4.1 3.6  CL 108  --  113* 115* 112*  CO2 21*  --  21* 20* 24  GLUCOSE 177*  --  109* 92 100*  BUN 33*  --  35* 28* 19  CREATININE 1.01*  --  0.90 0.75 0.72  CALCIUM 9.9  --  9.0 8.9 9.3  MG  --  1.9 2.0 2.1  --   PHOS  --  2.2* 2.6  --   --      F/u labs ordered Unresulted Labs (From admission, onward)    None       Signed, Terrilee Croak, MD [facility] 09/30/2020

## 2020-09-30 NOTE — Progress Notes (Signed)
Occupational Therapy Treatment Patient Details Name: Kristina Wilkinson MRN: [id number] DOB: 1927-08-09 Today's Date: 09/30/2020    History of present illness Pt admitted from home 2* multiple falls, weakness and c/o L knee, L shoulder and hip pain - on imaging no acute findings.  Pt with hx of DM, LBP, L TKR, cerebral aneurysm repair, peripheral neuropathy, and multiple falls -"I call the fire department to get me up"   OT comments  Patient is making slow progress towards goals. Patient was set up for Ub bathing and grooming tasks seated EOB. Patient was min A x2 to transfer with RW from edge of bed to recliner in room. Patient was educated on importance of getting up every day. Patient was noted to have increased confusion with son going to a facility in [place] that was "just rehab" and the offered beds at various SNFs. Patient was encouraged to call family or friends to discuss options. Patient was assisted with calling desired friend.   Follow Up Recommendations  SNF    Equipment Recommendations  None recommended by OT    Recommendations for Other Services      Precautions / Restrictions Precautions Precautions: Fall Restrictions Weight Bearing Restrictions: No       Mobility Bed Mobility Overal bed mobility: Needs Assistance       Supine to sit: Min assist     General bed mobility comments: Increased time with min assist for trunk    Transfers Overall transfer level: Needs assistance Equipment used: Rolling walker (2 wheeled) Transfers: Sit to/from [facility] Sit to Stand: Min assist Stand pivot transfers: Min assist;+2 physical assistance       General transfer comment: verbal cues for hand placement and weight shifting    Balance Overall balance assessment: History of Falls Sitting-balance support: No upper extremity supported;Feet unsupported Sitting balance-Leahy Scale: Good     Standing balance support: Bilateral upper extremity  supported Standing balance-Leahy Scale: Poor                             ADL either performed or assessed with clinical judgement   ADL       Grooming: Sitting;Set up Grooming Details (indicate cue type and reason): sitting in recliner Upper Body Bathing: Set up;Sitting Upper Body Bathing Details (indicate cue type and reason): sitting in recliner Lower Body Bathing: Moderate assistance;Sit to/from stand;+2 for safety/equipment Lower Body Bathing Details (indicate cue type and reason): with two person assistance for safety.                       General ADL Comments: patient completed transfer to recliner in room with min A x2 with rolling walker to increase time out of bed.     Vision   Additional Comments: unable to assess formally with patient challanging to redirect.   Perception     Praxis      Cognition Arousal/Alertness: Awake/alert Behavior During Therapy: WFL for tasks assessed/performed Overall Cognitive Status: No family/caregiver present to determine baseline cognitive functioning                                 General Comments: tangential        Exercises     Shoulder Instructions       General Comments      Pertinent Vitals/ Pain  Faces Pain Scale: Hurts a little bit Pain Location: toes Pain Descriptors / Indicators: Tender Pain Intervention(s): Limited activity within patient's tolerance;Monitored during session  Home Living                                          Prior Functioning/Environment              Frequency  Min 2X/week        Progress Toward Goals  OT Goals(current goals can now be found in the care plan section)  Progress towards OT goals: Progressing toward goals  Acute Rehab OT Goals Patient Stated Goal: to get back home  Plan Discharge plan remains appropriate    Co-evaluation                 AM-PAC OT "6 Clicks" Daily Activity     Outcome  Measure   Help from another person eating meals?: A Little Help from another person taking care of personal grooming?: A Little Help from another person toileting, which includes using toliet, bedpan, or urinal?: A Lot Help from another person bathing (including washing, rinsing, drying)?: A Lot Help from another person to put on and taking off regular upper body clothing?: A Little Help from another person to put on and taking off regular lower body clothing?: A Lot 6 Click Score: 15    End of Session Equipment Utilized During Treatment: Rolling walker;Gait belt  OT Visit Diagnosis: Muscle weakness (generalized) (M62.81);History of falling (Z91.81)   Activity Tolerance Patient tolerated treatment well   Patient Left in chair;with call bell/phone within reach;with chair alarm set   Nurse Communication          Time: 1340-1404 OT Time Calculation (min): 24 min  Charges: OT General Charges $OT Visit: 1 Visit OT Treatments $Self Care/Home Management : 23-37 mins  Jackelyn Poling OTR/L, MS Acute Rehabilitation Department Office# [phone] Pager# [phone]    [place] 09/30/2020, 3:17 PM

## 2020-09-30 NOTE — Plan of Care (Signed)

## 2020-10-01 DIAGNOSIS — I4891 Unspecified atrial fibrillation: Secondary | ICD-10-CM | POA: Diagnosis not present

## 2020-10-01 LAB — GLUCOSE, CAPILLARY
Glucose-Capillary: 115 mg/dL — ABNORMAL HIGH (ref 70–99)
Glucose-Capillary: 155 mg/dL — ABNORMAL HIGH (ref 70–99)
Glucose-Capillary: 119 mg/dL — ABNORMAL HIGH (ref 70–99)
Glucose-Capillary: 147 mg/dL — ABNORMAL HIGH (ref 70–99)

## 2020-10-01 NOTE — Progress Notes (Signed)
PROGRESS NOTE    Kristina Wilkinson  [id number] DOB: [birth year] DOA: 09/25/2020 PCP: Pcp, No   Brief Narrative:  Kristina Wilkinson is a 87 y.o. female with PMH significant for DM2, HTN, debility, recurrent falls, CNS aneurysm s/p clipping, DVT (5/21) s/p eliquis x 3 months who lives alone. Patient presented to the ED from home with complaint of progressive generalized weakness leading to a fall on both her knees leading to bruising and left-sided rib pain.   In the ED, patient was noted to be in A. fib with RVR, started on Cardizem drip and admitted to hospital service. See below for details  Assessment & Plan:   Principal Problem:   New onset a-fib ([place]) Active Problems:   HTN (hypertension)   Diabetes mellitus without complication (HCC)   Abnormal urinalysis   Leukocytosis   Falls   New onset A. fib with RVR ? Initially placed on Cardizem drip.  After conversion to sinus rhythm, patient was switched to oral metoprolol.  Currently stable on Toprol at 12.5 mg daily.  Continue to monitor in telemetry. ? CHADSVASC score at least 6 putting her at high risk of stroke.  Per history, she tolerated a 69-monthcourse of Eliquis in May 2021 for DVT.  Patient agreed to long-term anticoagulation at this time because of A. fib.  Eliquis was started.   Hypertensive urgency: Blood pressure fairly controlled for her age.  Continue Toprol 12.5 mg daily, lisinopril 40 mg daily and amlodipine 10 mg daily.  Continue to monitor   Diabetes mellitus ? Well-controlled, A1c 6.1.  Continue SSI.   UTI ? Urinalysis showed large amount of leukocytes, positive nitrates and many bacteria.  Treated with a dose of fosfomycin.   Generalized weakness/recurrent falls: PT OT recommends SNF.  Patient very adamant on going home.  She does not even want home health.  Patient 87year old and lives alone.  Has a son and daughter who lives in [place]  Explained long time trying to convince her and counselor however she is  very adamant on her decision.  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  I informed him about recommendations for SNF.  He agreed with that.  He also was on the same page that patient should not go home as she is not doing well.  I requested him to come talk to the patient and convince her to go to rehab.  He was out of town currently but he will return later today and will talk to patient.  Patient is not safe to go home.  DVT prophylaxis:    Code Status: Full Code  Family Communication: None present at bedside.  Plan of care discussed with patient in length and he verbalized understanding and agreed with it.  Plan of care discussed with patient's son and best friend.  Status is: Inpatient  Remains inpatient appropriate because:Unsafe d/c plan  Dispo: The patient is from: Home              Anticipated  d/c is to: SNF              Patient currently is medically stable to d/c.   Difficult to place patient No        Estimated body mass index is 25.93 kg/m as calculated from the following:   Height as of this encounter: '5\' 3"'$  (1.6 m).   Weight as of this encounter: 66.4 kg.  Pressure Injury 03/07/19 Head Posterior Stage 2 -  Partial thickness loss of dermis presenting as a shallow open injury with a red, pink wound bed without slough. (Active)  03/07/19 1000  Location: Head  Location Orientation: Posterior  Staging: Stage 2 -  Partial thickness loss of dermis presenting as a shallow open injury with a red, pink wound bed without slough.  Wound Description (Comments):    Present on Admission: Yes    Nutritional Assessment: Body mass index is 25.93 kg/m.Marland Kitchen Seen by dietician.  I agree with the assessment and plan as outlined below: Nutrition Status:        .  Skin Assessment: I have examined the patient's skin and I agree with the wound assessment as performed by the wound care RN as outlined below: Pressure Injury 03/07/19 Head Posterior Stage 2 -  Partial thickness loss of dermis presenting as a shallow open injury with a red, pink wound bed without slough. (Active)  03/07/19 1000  Location: Head  Location Orientation: Posterior  Staging: Stage 2 -  Partial thickness loss of dermis presenting as a shallow open injury with a red, pink wound bed without slough.  Wound Description (Comments):   Present on Admission: Yes    Consultants:  None  Procedures:  None  Antimicrobials:  Anti-infectives (From admission, onward)    Start     Dose/Rate Route Frequency Ordered Stop   09/26/20 1000  nitrofurantoin (macrocrystal-monohydrate) (MACROBID) capsule 100 mg  Status:  Discontinued        100 mg Oral Every 12 hours 09/26/20 0734 09/26/20 0735   09/26/20 0800  fosfomycin (MONUROL) packet 3 g        3 g Oral  Once 09/26/20 0735 09/26/20 0936          Subjective: Seen and examined.  She has no complaints.  She was asking when she can go home.  Objective: Vitals:   09/30/20 1626 09/30/20 2037 10/01/20 0522 10/01/20 0817  BP: (!) 146/64 (!) 161/64 (!) 158/56 (!) 153/53  Pulse: 67 70 65 70  Resp: '19 18 18 14  '$ Temp: 98.5 F (36.9 C) 98.4 F (36.9 C) 98.4 F (36.9 C) 98.3 F (36.8 C)  TempSrc: Oral   Oral  SpO2: 99% 97% 100% 96%  Weight:      Height:        Intake/Output Summary (Last 24 hours) at 10/01/2020 1135 Last data filed at 10/01/2020 1046 Gross per 24 hour  Intake --  Output 300 ml  Net -300 ml   Filed Weights   09/28/20 0500 09/29/20 0500 09/30/20 0500  Weight: 66.2 kg 66.3 kg 66.4 kg    Examination:  General  exam: Appears calm and comfortable  Respiratory system: Clear to auscultation. Respiratory effort normal. Cardiovascular system: S1 & S2 heard, RRR. No JVD, murmurs, rubs, gallops or clicks. No pedal edema. Gastrointestinal system: Abdomen is nondistended, soft and nontender. No organomegaly or masses felt. Normal bowel sounds heard. Central nervous system: Alert and oriented. No focal neurological deficits. Extremities: Symmetric 5 x 5 power. Skin: No rashes, lesions  or ulcers Psychiatry: Judgement and insight appear normal. Mood & affect appropriate.    Data Reviewed: I have personally reviewed following labs and imaging studies  CBC: Recent Labs  Lab 09/25/20 0503 09/26/20 0521 09/27/20 0405 09/30/20 0511  WBC 15.3* 8.8 6.5 6.8  NEUTROABS  --   --   --  4.1  HGB 11.4* 9.4* 9.6* 11.4*  HCT 35.0* 29.0* 30.3* 35.7*  MCV 91.6 92.7 95.0 93.7  PLT 172 149* 142* [id number]   Basic Metabolic Panel: Recent Labs  Lab 09/25/20 0503 09/25/20 1429 09/26/20 0521 09/27/20 0405 09/30/20 0511  NA 141  --  140 139 141  K 3.3*  --  3.0* 4.1 3.6  CL 108  --  113* 115* 112*  CO2 21*  --  21* 20* 24  GLUCOSE 177*  --  109* 92 100*  BUN 33*  --  35* 28* 19  CREATININE 1.01*  --  0.90 0.75 0.72  CALCIUM 9.9  --  9.0 8.9 9.3  MG  --  1.9 2.0 2.1  --   PHOS  --  2.2* 2.6  --   --    GFR: Estimated Creatinine Clearance: 41.9 mL/min (by C-G formula based on SCr of 0.72 mg/dL). Liver Function Tests: No results for input(s): AST, ALT, ALKPHOS, BILITOT, PROT, ALBUMIN in the last 168 hours. No results for input(s): LIPASE, AMYLASE in the last 168 hours. No results for input(s): AMMONIA in the last 168 hours. Coagulation Profile: Recent Labs  Lab 09/26/20 0521  INR 1.2   Cardiac Enzymes: Recent Labs  Lab 09/25/20 0503  CKTOTAL 727*   BNP (last 3 results) No results for input(s): PROBNP in the last 8760 hours. HbA1C: No results for input(s): HGBA1C in the last 72 hours. CBG: Recent Labs   Lab 09/30/20 1145 09/30/20 1623 09/30/20 2048 10/01/20 0728 10/01/20 1118  GLUCAP 113* 172* 146* 115* 155*   Lipid Profile: No results for input(s): CHOL, HDL, LDLCALC, TRIG, CHOLHDL, LDLDIRECT in the last 72 hours. Thyroid Function Tests: No results for input(s): TSH, T4TOTAL, FREET4, T3FREE, THYROIDAB in the last 72 hours. Anemia Panel: No results for input(s): VITAMINB12, FOLATE, FERRITIN, TIBC, IRON, RETICCTPCT in the last 72 hours. Sepsis Labs: No results for input(s): PROCALCITON, LATICACIDVEN in the last 168 hours.  Recent Results (from the past 240 hour(s))  Resp Panel by RT-PCR (Flu A&B, Covid) Nasopharyngeal Swab     Status: None   Collection Time: 09/25/20  7:47 AM   Specimen: Nasopharyngeal Swab; Nasopharyngeal(NP) swabs in vial transport medium  Result Value Ref Range Status   SARS Coronavirus 2 by RT PCR NEGATIVE NEGATIVE Final    Comment: (NOTE) SARS-CoV-2 target nucleic acids are NOT DETECTED.  The SARS-CoV-2 RNA is generally detectable in upper respiratory specimens during the acute phase of infection. The lowest concentration of SARS-CoV-2 viral copies this assay can detect is 138 copies/mL. A negative result does not preclude SARS-Cov-2 infection and should not be used as the sole basis for treatment or other patient management decisions. A negative result may occur with  improper specimen collection/handling, submission of specimen other than nasopharyngeal swab, presence of viral mutation(s) within the areas targeted by this assay, and inadequate number of viral copies(<138 copies/mL). A negative result must be combined with clinical observations, patient history, and epidemiological information. The expected result is Negative.  Fact Sheet for Patients:  EntrepreneurPulse.com.au  Fact Sheet for Healthcare Providers:  IncredibleEmployment.be  This test is no t yet approved or cleared by  the [employer] and   has been authorized for detection and/or diagnosis of SARS-CoV-2 by FDA under an Emergency Use Authorization (EUA). This EUA will remain  in effect (meaning this test can be used) for the duration of the COVID-19 declaration under Section 564(b)(1) of the Act, 21 U.S.C.section 360bbb-3(b)(1), unless the authorization is terminated  or revoked sooner.       Influenza A by PCR NEGATIVE NEGATIVE Final   Influenza B by PCR NEGATIVE NEGATIVE Final    Comment: (NOTE) The Xpert Xpress SARS-CoV-2/FLU/RSV plus assay is intended as an aid in the diagnosis of influenza from Nasopharyngeal swab specimens and should not be used as a sole basis for treatment. Nasal washings and aspirates are unacceptable for Xpert Xpress SARS-CoV-2/FLU/RSV testing.  Fact Sheet for Patients: EntrepreneurPulse.com.au  Fact Sheet for Healthcare Providers: IncredibleEmployment.be  This test is not yet approved or cleared by the [place] FDA and has been authorized for detection and/or diagnosis of SARS-CoV-2 by FDA under an Emergency Use Authorization (EUA). This EUA will remain in effect (meaning this test can be used) for the duration of the COVID-19 declaration under Section 564(b)(1) of the Act, 21 U.S.C. section 360bbb-3(b)(1), unless the authorization is terminated or revoked.  Performed at [facility], [place] [address] [place], [place] [address]       Radiology Studies: No results found.  Scheduled Meds:  amLODipine  10 mg Oral Daily   apixaban  5 mg Oral BID   insulin aspart  0-9 Units Subcutaneous TID WC   lisinopril  40 mg Oral Daily   metoprolol succinate  12.5 mg Oral Daily   Continuous Infusions:   LOS: 5 days   Time spent: 37 minutes   Darliss Cheney, MD [facility]  10/01/2020, 11:35 AM   How to contact the [facility] Attending or Consulting provider [place] or covering provider during after hours [place], for this  patient?  Check the care team in [facility] and look for a) attending/consulting TRH provider listed and b) the [facility] team listed. Page or secure chat [phone]. Log into [email or web address] and use [place]'s universal password to access. If you do not have the password, please contact the hospital operator. Locate the [facility] provider you are looking for under [facility] and page to a number that you can be directly reached. If you still have difficulty reaching the provider, please page the [facility] (Director on Call) for the Hospitalists listed on amion for assistance.

## 2020-10-02 DIAGNOSIS — I4891 Unspecified atrial fibrillation: Secondary | ICD-10-CM | POA: Diagnosis not present

## 2020-10-02 LAB — GLUCOSE, CAPILLARY
Glucose-Capillary: 111 mg/dL — ABNORMAL HIGH (ref 70–99)
Glucose-Capillary: 176 mg/dL — ABNORMAL HIGH (ref 70–99)
Glucose-Capillary: 106 mg/dL — ABNORMAL HIGH (ref 70–99)
Glucose-Capillary: 163 mg/dL — ABNORMAL HIGH (ref 70–99)

## 2020-10-02 NOTE — Plan of Care (Signed)
  Problem: Clinical Measurements: Goal: Diagnostic test results will improve Outcome: Progressing   Problem: Activity: Goal: Risk for activity intolerance will decrease Outcome: Progressing   

## 2020-10-02 NOTE — Progress Notes (Signed)
PROGRESS NOTE    [name]  [id number] DOB: [birth year] DOA: 09/25/2020 PCP: Pcp, No   Brief Narrative:  Kristina Wilkinson is a 87 y.o. female with PMH significant for DM2, HTN, debility, recurrent falls, CNS aneurysm s/p clipping, DVT (5/21) s/p eliquis x 3 months who lives alone. Patient presented to the ED from home with complaint of progressive generalized weakness leading to a fall on both her knees leading to bruising and left-sided rib pain.   In the ED, patient was noted to be in A. fib with RVR, started on Cardizem drip and admitted to hospital service. See below for details  Assessment & Plan:   Principal Problem:   New onset a-fib ([place]) Active Problems:   HTN (hypertension)   Diabetes mellitus without complication (HCC)   Abnormal urinalysis   Leukocytosis   Falls   New onset A. fib with RVR ? Initially placed on Cardizem drip.  After conversion to sinus rhythm, patient was switched to oral metoprolol.  Currently stable on Toprol at 12.5 mg daily.  Continue to monitor in telemetry. ? CHADSVASC score at least 6 putting her at high risk of stroke.  Per history, she tolerated a 89-monthcourse of Eliquis in May 2021 for DVT.  Patient agreed to long-term anticoagulation at this time because of A. fib.  Eliquis was started.   Hypertensive urgency: Blood pressure fairly controlled for her age.  Continue Toprol 12.5 mg daily, lisinopril 40 mg daily and amlodipine 10 mg daily.  Continue to monitor   Diabetes mellitus ? Well-controlled, A1c 6.1.  Continue SSI.   UTI ? Urinalysis showed large amount of leukocytes, positive nitrates and many bacteria.  Treated with a dose of fosfomycin.   Generalized weakness/recurrent falls: PT OT recommends SNF.  Patient lives alone.  No family around.  Initially was very adamant on going home.  Not safe to go home.  Discussed with her son and her best friend, finally she is convinced to go to SNF.  TOC informed.  Waiting for bed  placement.  DVT prophylaxis:    Code Status: Full Code  Family Communication: None present at bedside.  Tried calling her best friend again today.  Left voicemail.  Status is: Inpatient  Remains inpatient appropriate because:Unsafe d/c plan  Dispo: The patient is from: Home              Anticipated d/c is to: SNF              Patient currently is medically stable to d/c.   Difficult to place patient No        Estimated body mass index is 26.2 kg/m as calculated from the following:   Height as of this encounter: '5\' 3"'$  (1.6 m).   Weight as of this encounter: 67.1 kg.  Pressure Injury 03/07/19 Head Posterior Stage 2 -  Partial thickness loss of dermis presenting as a shallow open injury with a red, pink wound bed without slough. (Active)  03/07/19 1000  Location: Head  Location Orientation: Posterior  Staging: Stage 2 -  Partial thickness loss of dermis presenting as a shallow open injury with a red, pink wound bed without slough.  Wound Description (Comments):   Present on Admission: Yes    Nutritional Assessment: Body mass index is 26.2 kg/m..Marland KitchenSeen by dietician.  I agree with the assessment and plan as outlined below: Nutrition Status:        .  Skin Assessment: I have examined the patient's  skin and I agree with the wound assessment as performed by the wound care RN as outlined below: Pressure Injury 03/07/19 Head Posterior Stage 2 -  Partial thickness loss of dermis presenting as a shallow open injury with a red, pink wound bed without slough. (Active)  03/07/19 1000  Location: Head  Location Orientation: Posterior  Staging: Stage 2 -  Partial thickness loss of dermis presenting as a shallow open injury with a red, pink wound bed without slough.  Wound Description (Comments):   Present on Admission: Yes    Consultants:  None  Procedures:  None  Antimicrobials:  Anti-infectives (From admission, onward)    Start     Dose/Rate Route Frequency Ordered  Stop   09/26/20 1000  nitrofurantoin (macrocrystal-monohydrate) (MACROBID) capsule 100 mg  Status:  Discontinued        100 mg Oral Every 12 hours 09/26/20 0734 09/26/20 0735   09/26/20 0800  fosfomycin (MONUROL) packet 3 g        3 g Oral  Once 09/26/20 0735 09/26/20 0936          Subjective: Seen and examined.  She has no complaints.  She is now willing to go to SNF.  Objective: Vitals:   10/01/20 1413 10/01/20 2101 10/02/20 0527 10/02/20 1045  BP: (!) 133/49 (!) 139/46 (!) 175/70 (!) 170/66  Pulse: 73 69 64 64  Resp: '14 18 18 19  '$ Temp: 98.8 F (37.1 C) 98.4 F (36.9 C) 98.4 F (36.9 C)   TempSrc: Oral Oral Oral   SpO2: 96% 99% 98%   Weight:   67.1 kg   Height:        Intake/Output Summary (Last 24 hours) at 10/02/2020 1059 Last data filed at 10/02/2020 0935 Gross per 24 hour  Intake 490 ml  Output 1050 ml  Net -560 ml    Filed Weights   09/29/20 0500 09/30/20 0500 10/02/20 0527  Weight: 66.3 kg 66.4 kg 67.1 kg    Examination:  General exam: Appears calm and comfortable  Respiratory system: Clear to auscultation. Respiratory effort normal. Cardiovascular system: S1 & S2 heard, RRR. No JVD, murmurs, rubs, gallops or clicks. No pedal edema. Gastrointestinal system: Abdomen is nondistended, soft and nontender. No organomegaly or masses felt. Normal bowel sounds heard. Central nervous system: Alert and oriented. No focal neurological deficits. Extremities: Symmetric 5 x 5 power. Skin: No rashes, lesions or ulcers.  Psychiatry: Judgement and insight appear normal. Mood & affect appropriate.    Data Reviewed: I have personally reviewed following labs and imaging studies  CBC: Recent Labs  Lab 09/26/20 0521 09/27/20 0405 09/30/20 0511  WBC 8.8 6.5 6.8  NEUTROABS  --   --  4.1  HGB 9.4* 9.6* 11.4*  HCT 29.0* 30.3* 35.7*  MCV 92.7 95.0 93.7  PLT 149* 142* [id number]    Basic Metabolic Panel: Recent Labs  Lab 09/25/20 1429 09/26/20 0521 09/27/20 0405  09/30/20 0511  NA  --  140 139 141  K  --  3.0* 4.1 3.6  CL  --  113* 115* 112*  CO2  --  21* 20* 24  GLUCOSE  --  109* 92 100*  BUN  --  35* 28* 19  CREATININE  --  0.90 0.75 0.72  CALCIUM  --  9.0 8.9 9.3  MG 1.9 2.0 2.1  --   PHOS 2.2* 2.6  --   --     GFR: Estimated Creatinine Clearance: 42.2 mL/min (by C-G formula based on SCr  of 0.72 mg/dL). Liver Function Tests: No results for input(s): AST, ALT, ALKPHOS, BILITOT, PROT, ALBUMIN in the last 168 hours. No results for input(s): LIPASE, AMYLASE in the last 168 hours. No results for input(s): AMMONIA in the last 168 hours. Coagulation Profile: Recent Labs  Lab 09/26/20 0521  INR 1.2    Cardiac Enzymes: No results for input(s): CKTOTAL, CKMB, CKMBINDEX, TROPONINI in the last 168 hours.  BNP (last 3 results) No results for input(s): PROBNP in the last 8760 hours. HbA1C: No results for input(s): HGBA1C in the last 72 hours. CBG: Recent Labs  Lab 10/01/20 0728 10/01/20 1118 10/01/20 1649 10/01/20 2103 10/02/20 0746  GLUCAP 115* 155* 119* 147* 111*    Lipid Profile: No results for input(s): CHOL, HDL, LDLCALC, TRIG, CHOLHDL, LDLDIRECT in the last 72 hours. Thyroid Function Tests: No results for input(s): TSH, T4TOTAL, FREET4, T3FREE, THYROIDAB in the last 72 hours. Anemia Panel: No results for input(s): VITAMINB12, FOLATE, FERRITIN, TIBC, IRON, RETICCTPCT in the last 72 hours. Sepsis Labs: No results for input(s): PROCALCITON, LATICACIDVEN in the last 168 hours.  Recent Results (from the past 240 hour(s))  Resp Panel by RT-PCR (Flu A&B, Covid) Nasopharyngeal Swab     Status: None   Collection Time: 09/25/20  7:47 AM   Specimen: Nasopharyngeal Swab; Nasopharyngeal(NP) swabs in vial transport medium  Result Value Ref Range Status   SARS Coronavirus 2 by RT PCR NEGATIVE NEGATIVE Final    Comment: (NOTE) SARS-CoV-2 target nucleic acids are NOT DETECTED.  The SARS-CoV-2 RNA is generally detectable in upper  respiratory specimens during the acute phase of infection. The lowest concentration of SARS-CoV-2 viral copies this assay can detect is 138 copies/mL. A negative result does not preclude SARS-Cov-2 infection and should not be used as the sole basis for treatment or other patient management decisions. A negative result may occur with  improper specimen collection/handling, submission of specimen other than nasopharyngeal swab, presence of viral mutation(s) within the areas targeted by this assay, and inadequate number of viral copies(<138 copies/mL). A negative result must be combined with clinical observations, patient history, and epidemiological information. The expected result is Negative.  Fact Sheet for Patients:  EntrepreneurPulse.com.au  Fact Sheet for Healthcare Providers:  IncredibleEmployment.be  This test is no t yet approved or cleared by the [place] FDA and  has been authorized for detection and/or diagnosis of SARS-CoV-2 by FDA under an Emergency Use Authorization (EUA). This EUA will remain  in effect (meaning this test can be used) for the duration of the COVID-19 declaration under Section 564(b)(1) of the Act, 21 U.S.C.section 360bbb-3(b)(1), unless the authorization is terminated  or revoked sooner.       Influenza A by PCR NEGATIVE NEGATIVE Final   Influenza B by PCR NEGATIVE NEGATIVE Final    Comment: (NOTE) The Xpert Xpress SARS-CoV-2/FLU/RSV plus assay is intended as an aid in the diagnosis of influenza from Nasopharyngeal swab specimens and should not be used as a sole basis for treatment. Nasal washings and aspirates are unacceptable for Xpert Xpress SARS-CoV-2/FLU/RSV testing.  Fact Sheet for Patients: EntrepreneurPulse.com.au  Fact Sheet for Healthcare Providers: IncredibleEmployment.be  This test is not yet approved or cleared by the [place] FDA and has been  authorized for detection and/or diagnosis of SARS-CoV-2 by FDA under an Emergency Use Authorization (EUA). This EUA will remain in effect (meaning this test can be used) for the duration of the COVID-19 declaration under Section 564(b)(1) of the Act, 21 U.S.C. section 360bbb-3(b)(1), unless  the authorization is terminated or revoked.  Performed at [facility], [place] [address] [place], [place] [address]        Radiology Studies: No results found.  Scheduled Meds:  amLODipine  10 mg Oral Daily   apixaban  5 mg Oral BID   insulin aspart  0-9 Units Subcutaneous TID WC   lisinopril  40 mg Oral Daily   metoprolol succinate  12.5 mg Oral Daily   Continuous Infusions:   LOS: 6 days   Time spent: 30 minutes   Darliss Cheney, MD [facility]  10/02/2020, 10:59 AM   How to contact the [facility] Attending or Consulting provider [place] or covering provider during after hours [place], for this patient?  Check the care team in [facility] and look for a) attending/consulting TRH provider listed and b) the [facility] team listed. Page or secure chat [phone]. Log into [email or web address] and use [place]'s universal password to access. If you do not have the password, please contact the hospital operator. Locate the [facility] provider you are looking for under [facility] and page to a number that you can be directly reached. If you still have difficulty reaching the provider, please page the [facility] (Director on Call) for the Hospitalists listed on amion for assistance.

## 2020-10-02 NOTE — Progress Notes (Signed)
Physical Therapy Treatment Patient Details Name: Kristina Wilkinson MRN: [id number] DOB: Dec 23, 1927 Today's Date: 10/02/2020    History of Present Illness Pt admitted from home 2* multiple falls, weakness and c/o L knee, L shoulder and hip pain - on imaging no acute findings.  Pt with hx of DM, LBP, L TKR, cerebral aneurysm repair, peripheral neuropathy, and multiple falls -"I call the fire department to get me up"    PT Comments    Pt is a "fisty" 79 year who lives home alone but repeatably falls. AxO x 2 very HOH and tangential.  Openly tells you won't she will and won't do but also pleasant.  Required repeat instruction on safety with turns esp with hand placement during stand to sit as pt tends to sit too early. Assisted OOB to amb.  "I can't walk" stated pt.  Required MAX encouragement to walk to [facility] that was parked across the room.  General transfer comment: 50% VC's on proper hand placement to "push off" vs pull up on walker.  75% VC's on safety with turn complwetion prior to sit and 100% VC's to reach back to control decend.  VERY unsteady.  Unable to self correct to mid line and x 4 posterior LOB during todays session.General Gait Details: VERY unsteady gait with severe RIGHT posterior lean with inability to self correct and increased fear anxiety.  Pt also with tendancy to sit before it is safe.  Poor flex posture and poor balance.  Would NOT rec pt amb on her own.  HIGH FALL RISK. Pt repeatably refusing to go to a facility yet admits she can not go home if she keeps falling.   Follow Up Recommendations  SNF     Equipment Recommendations  None recommended by PT    Recommendations for Other Services       Precautions / Restrictions Precautions Precautions: Fall Precaution Comments: multiple falls at home    Mobility  Bed Mobility Overal bed mobility: Needs Assistance Bed Mobility: Supine to Sit     Supine to sit: Min assist     General bed mobility comments: increased time  with posterior lean and difficulty self scooting to EOB without falling backward.    Transfers Overall transfer level: Needs assistance Equipment used: Rolling walker (2 wheeled) Transfers: Sit to/from [facility] Sit to Stand: Min assist;Mod assist Stand pivot transfers: Mod assist;Max assist       General transfer comment: 50% VC's on proper hand placement to "push off" vs pull up on walker.  75% VC's on safety with turn complwetion prior to sit and 100% VC's to reach back to control decend.  VERY unsteady.  Unable to self correct to mid line and x 4 posterior LOB during todays session.  Ambulation/Gait Ambulation/Gait assistance: Min assist;+2 safety/equipment Gait Distance (Feet): 22 Feet (11 feet x 2 to and from [facility] that parked across the room as a target.) Assistive device: Rolling walker (2 wheeled) (youth) Gait Pattern/deviations: Shuffle;Trunk flexed;Decreased stride length Gait velocity: decreased   General Gait Details: VERY unsteady gait with severe RIGHT posterior lean with inability to self correct and increased fear anxiety.  Pt also with tendancy to sit before it is safe.  Poor flex posture and poor balance.  Would NOT rec pt amb on her own.  HIGH FALL RISK.   Stairs             Wheelchair Mobility    Modified Rankin (Stroke Patients Only)  Balance                                            Cognition Arousal/Alertness: Awake/alert Behavior During Therapy: WFL for tasks assessed/performed Overall Cognitive Status: Within Functional Limits for tasks assessed                                 General Comments: AxO x 2 very HOH and tangential.  Openly tells you won't she will and won't do but also pleasant.  Required repeat instruction on safety with turns esp with hand placement during stand to sit as pt tends to sit too early.      Exercises      General Comments        Pertinent Vitals/Pain  Pain Assessment: No/denies pain Pain Location: "not really" Pain Intervention(s): Monitored during session    Home Living                      Prior Function            PT Goals (current goals can now be found in the care plan section) Progress towards PT goals: Progressing toward goals    Frequency    Min 2X/week      PT Plan Current plan remains appropriate    Co-evaluation              AM-PAC PT "6 Clicks" Mobility   Outcome Measure  Help needed turning from your back to your side while in a flat bed without using bedrails?: A Lot Help needed moving from lying on your back to sitting on the side of a flat bed without using bedrails?: A Lot Help needed moving to and from a bed to a chair (including a wheelchair)?: A Lot Help needed standing up from a chair using your arms (e.g., wheelchair or bedside chair)?: A Lot Help needed to walk in hospital room?: A Lot Help needed climbing 3-5 steps with a railing? : Total 6 Click Score: 11    End of Session Equipment Utilized During Treatment: Gait belt Activity Tolerance: Patient limited by fatigue;Other (comment) (weakness) Patient left: in chair;with call bell/phone within reach;with chair alarm set Nurse Communication: Mobility status PT Visit Diagnosis: Unsteadiness on feet (R26.81);Muscle weakness (generalized) (M62.81);History of falling (Z91.81)     Time: [id number] PT Time Calculation (min) (ACUTE ONLY): 29 min  Charges:  $Gait Training: 8-22 mins $Therapeutic Activity: 8-22 mins                     {Saleha Kalp  PTA Acute  Rehabilitation Services Pager      [phone] Office      [phone]

## 2020-10-03 DIAGNOSIS — I4891 Unspecified atrial fibrillation: Secondary | ICD-10-CM | POA: Diagnosis not present

## 2020-10-03 LAB — GLUCOSE, CAPILLARY
Glucose-Capillary: 142 mg/dL — ABNORMAL HIGH (ref 70–99)
Glucose-Capillary: 102 mg/dL — ABNORMAL HIGH (ref 70–99)

## 2020-10-03 MED ORDER — POLYVINYL ALCOHOL 1.4 % OP SOLN
1.0000 [drp] | OPHTHALMIC | Status: DC | PRN
  Filled 2020-10-03: qty 15

## 2020-10-03 NOTE — Progress Notes (Signed)
PROGRESS NOTE    Kristina Wilkinson  [id number] DOB: 02/17/1927 DOA: 09/25/2020 PCP: Pcp, No   Brief Narrative:  Kristina Wilkinson is a 87 y.o. female with PMH significant for DM2, HTN, debility, recurrent falls, CNS aneurysm s/p clipping, DVT (5/21) s/p eliquis x 3 months who lives alone. Patient presented to the ED from home with complaint of progressive generalized weakness leading to a fall on both her knees leading to bruising and left-sided rib pain.   In the ED, patient was noted to be in A. fib with RVR, started on Cardizem drip and admitted to hospital service. See below for details  Assessment & Plan:   Principal Problem:   New onset a-fib ([place]) Active Problems:   HTN (hypertension)   Diabetes mellitus without complication (HCC)   Abnormal urinalysis   Leukocytosis   Falls   New onset A. fib with RVR ? Initially placed on Cardizem drip.  After conversion to sinus rhythm, patient was switched to oral metoprolol.  Currently stable on Toprol at 12.5 mg daily.  Continue to monitor in telemetry. ? CHADSVASC score at least 6 putting her at high risk of stroke.  Per history, she tolerated a 10-monthcourse of Eliquis in May 2021 for DVT.  Patient agreed to long-term anticoagulation at this time because of A. fib.  Eliquis was started.   Hypertensive urgency: Blood pressure fairly controlled for her age.  Continue Toprol 12.5 mg daily, lisinopril 40 mg daily and amlodipine 10 mg daily.  Continue to monitor   Diabetes mellitus ? Well-controlled, A1c 6.1.  Continue SSI.   UTI ? Urinalysis showed large amount of leukocytes, positive nitrates and many bacteria.  Treated with a dose of fosfomycin.   Generalized weakness/recurrent falls: PT OT recommends SNF.  Patient very adamant on going home.  She does not even want home health.  Patient [age]year old and lives alone.  Has a son and daughter who lives in [place]  Spent long time trying to convince her and counselor however she is very  adamant on her decision.  Eventually I asked her if I could talk to her family member and she granted permission to talk to her son Kristina Wilkinson  I spoke to him however he also had stroke recently and he has significant dysarthria and he is unable to care for himself and he referred me to talk to patient's best friend Kristina Bouillonhowever he mentioned that we should do what is best for the patient.  [place] informed me that for the last 2 to 3 weeks, patient was even unable to get up and she kept calling fire department almost on daily basis.  I informed him about recommendations for SNF.  He agreed with that.  He also was on the same page that patient should not go home as she is not doing well.  I requested him to come talk to the patient and convince her to go to rehab.  So he did and patient was agreeable to go to SNF yesterday but she is changing her mind today.  Now I am convinced that patient does not have capacity to make decisions for herself.  DVT prophylaxis:    Code Status: Full Code  Family Communication: None present at bedside.  I tried calling her daughter today and left a voicemail.  Status is: Inpatient  Remains inpatient appropriate because:Unsafe d/c plan  Dispo: The patient is from: Home  Anticipated d/c is to: SNF              Patient currently is medically stable to d/c.   Difficult to place patient No        Estimated body mass index is 25.03 kg/m as calculated from the following:   Height as of this encounter: '5\' 3"'$  (1.6 m).   Weight as of this encounter: 64.1 kg.  Pressure Injury 03/07/19 Head Posterior Stage 2 -  Partial thickness loss of dermis presenting as a shallow open injury with a red, pink wound bed without slough. (Active)  03/07/19 1000  Location: Head  Location Orientation: Posterior  Staging: Stage 2 -  Partial thickness loss of dermis presenting as a shallow open injury with a red, pink wound bed without slough.  Wound  Description (Comments):   Present on Admission: Yes    Nutritional Assessment: Body mass index is 25.03 kg/m.Marland Kitchen Seen by dietician.  I agree with the assessment and plan as outlined below: Nutrition Status:        .  Skin Assessment: I have examined the patient's skin and I agree with the wound assessment as performed by the wound care RN as outlined below: Pressure Injury 03/07/19 Head Posterior Stage 2 -  Partial thickness loss of dermis presenting as a shallow open injury with a red, pink wound bed without slough. (Active)  03/07/19 1000  Location: Head  Location Orientation: Posterior  Staging: Stage 2 -  Partial thickness loss of dermis presenting as a shallow open injury with a red, pink wound bed without slough.  Wound Description (Comments):   Present on Admission: Yes    Consultants:  None  Procedures:  None  Antimicrobials:  Anti-infectives (From admission, onward)    Start     Dose/Rate Route Frequency Ordered Stop   09/26/20 1000  nitrofurantoin (macrocrystal-monohydrate) (MACROBID) capsule 100 mg  Status:  Discontinued        100 mg Oral Every 12 hours 09/26/20 0734 09/26/20 0735   09/26/20 0800  fosfomycin (MONUROL) packet 3 g        3 g Oral  Once 09/26/20 0735 09/26/20 0936          Subjective: Seen and examined.  She has no complaints.  She can barely lift her legs from the bed due to significant weakness.  Objective: Vitals:   10/02/20 1045 10/02/20 1255 10/02/20 1958 10/03/20 0438  BP: (!) 170/66 (!) 135/47 (!) 151/69 (!) 166/59  Pulse: 64 73 66 61  Resp: '19 19 17 18  '$ Temp:  98.2 F (36.8 C) 97.9 F (36.6 C) 98.2 F (36.8 C)  TempSrc:   Oral Oral  SpO2:  97% 99% 98%  Weight:    64.1 kg  Height:        Intake/Output Summary (Last 24 hours) at 10/03/2020 1102 Last data filed at 10/03/2020 0826 Gross per 24 hour  Intake 600 ml  Output 1100 ml  Net -500 ml    Filed Weights   09/30/20 0500 10/02/20 0527 10/03/20 0438  Weight: 66.4  kg 67.1 kg 64.1 kg    Examination:  General exam: Appears calm and comfortable  Respiratory system: Clear to auscultation. Respiratory effort normal. Cardiovascular system: S1 & S2 heard, RRR. No JVD, murmurs, rubs, gallops or clicks. No pedal edema. Gastrointestinal system: Abdomen is nondistended, soft and nontender. No organomegaly or masses felt. Normal bowel sounds heard. Central nervous system: Alert and oriented. No focal neurological deficits.  Just generalized weakness,  more in bilateral lower extremities. Extremities: Symmetric 5 x 5 power. Skin: No rashes, lesions or ulcers.  Psychiatry: Judgement and insight appear poor  Data Reviewed: I have personally reviewed following labs and imaging studies  CBC: Recent Labs  Lab 09/27/20 0405 09/30/20 0511  WBC 6.5 6.8  NEUTROABS  --  4.1  HGB 9.6* 11.4*  HCT 30.3* 35.7*  MCV 95.0 93.7  PLT 142* [id number]    Basic Metabolic Panel: Recent Labs  Lab 09/27/20 0405 09/30/20 0511  NA 139 141  K 4.1 3.6  CL 115* 112*  CO2 20* 24  GLUCOSE 92 100*  BUN 28* 19  CREATININE 0.75 0.72  CALCIUM 8.9 9.3  MG 2.1  --     GFR: Estimated Creatinine Clearance: 41.3 mL/min (by C-G formula based on SCr of 0.72 mg/dL). Liver Function Tests: No results for input(s): AST, ALT, ALKPHOS, BILITOT, PROT, ALBUMIN in the last 168 hours. No results for input(s): LIPASE, AMYLASE in the last 168 hours. No results for input(s): AMMONIA in the last 168 hours. Coagulation Profile: No results for input(s): INR, PROTIME in the last 168 hours.  Cardiac Enzymes: No results for input(s): CKTOTAL, CKMB, CKMBINDEX, TROPONINI in the last 168 hours.  BNP (last 3 results) No results for input(s): PROBNP in the last 8760 hours. HbA1C: No results for input(s): HGBA1C in the last 72 hours. CBG: Recent Labs  Lab 10/01/20 2103 10/02/20 0746 10/02/20 1253 10/02/20 1751 10/02/20 1959  GLUCAP 147* 111* 176* 106* 163*    Lipid Profile: No results for  input(s): CHOL, HDL, LDLCALC, TRIG, CHOLHDL, LDLDIRECT in the last 72 hours. Thyroid Function Tests: No results for input(s): TSH, T4TOTAL, FREET4, T3FREE, THYROIDAB in the last 72 hours. Anemia Panel: No results for input(s): VITAMINB12, FOLATE, FERRITIN, TIBC, IRON, RETICCTPCT in the last 72 hours. Sepsis Labs: No results for input(s): PROCALCITON, LATICACIDVEN in the last 168 hours.  Recent Results (from the past 240 hour(s))  Resp Panel by RT-PCR (Flu A&B, Covid) Nasopharyngeal Swab     Status: None   Collection Time: 09/25/20  7:47 AM   Specimen: Nasopharyngeal Swab; Nasopharyngeal(NP) swabs in vial transport medium  Result Value Ref Range Status   SARS Coronavirus 2 by RT PCR NEGATIVE NEGATIVE Final    Comment: (NOTE) SARS-CoV-2 target nucleic acids are NOT DETECTED.  The SARS-CoV-2 RNA is generally detectable in upper respiratory specimens during the acute phase of infection. The lowest concentration of SARS-CoV-2 viral copies this assay can detect is 138 copies/mL. A negative result does not preclude SARS-Cov-2 infection and should not be used as the sole basis for treatment or other patient management decisions. A negative result may occur with  improper specimen collection/handling, submission of specimen other than nasopharyngeal swab, presence of viral mutation(s) within the areas targeted by this assay, and inadequate number of viral copies(<138 copies/mL). A negative result must be combined with clinical observations, patient history, and epidemiological information. The expected result is Negative.  Fact Sheet for Patients:  EntrepreneurPulse.com.au  Fact Sheet for Healthcare Providers:  IncredibleEmployment.be  This test is no t yet approved or cleared by the [place] FDA and  has been authorized for detection and/or diagnosis of SARS-CoV-2 by FDA under an Emergency Use Authorization (EUA). This EUA will remain  in effect  (meaning this test can be used) for the duration of the COVID-19 declaration under Section 564(b)(1) of the Act, 21 U.S.C.section 360bbb-3(b)(1), unless the authorization is terminated  or revoked sooner.  Influenza A by PCR NEGATIVE NEGATIVE Final   Influenza B by PCR NEGATIVE NEGATIVE Final    Comment: (NOTE) The Xpert Xpress SARS-CoV-2/FLU/RSV plus assay is intended as an aid in the diagnosis of influenza from Nasopharyngeal swab specimens and should not be used as a sole basis for treatment. Nasal washings and aspirates are unacceptable for Xpert Xpress SARS-CoV-2/FLU/RSV testing.  Fact Sheet for Patients: EntrepreneurPulse.com.au  Fact Sheet for Healthcare Providers: IncredibleEmployment.be  This test is not yet approved or cleared by the [place] FDA and has been authorized for detection and/or diagnosis of SARS-CoV-2 by FDA under an Emergency Use Authorization (EUA). This EUA will remain in effect (meaning this test can be used) for the duration of the COVID-19 declaration under Section 564(b)(1) of the Act, 21 U.S.C. section 360bbb-3(b)(1), unless the authorization is terminated or revoked.  Performed at [facility], [place] [address] [place], [place] [address]        Radiology Studies: No results found.  Scheduled Meds:  amLODipine  10 mg Oral Daily   apixaban  5 mg Oral BID   insulin aspart  0-9 Units Subcutaneous TID WC   lisinopril  40 mg Oral Daily   metoprolol succinate  12.5 mg Oral Daily   Continuous Infusions:   LOS: 7 days   Time spent: 30 minutes   Darliss Cheney, MD [facility]  10/03/2020, 11:02 AM   How to contact the [facility] Attending or Consulting provider [place] or covering provider during after hours [place], for this patient?  Check the care team in [facility] and look for a) attending/consulting TRH provider listed and b) the [facility] team listed. Page or secure chat [phone]. Log  into [email or web address] and use [place]'s universal password to access. If you do not have the password, please contact the hospital operator. Locate the [facility] provider you are looking for under [facility] and page to a number that you can be directly reached. If you still have difficulty reaching the provider, please page the [facility] (Director on Call) for the Hospitalists listed on amion for assistance.

## 2020-10-03 NOTE — Care Management Important Message (Signed)
Important Message  Patient Details IM Letter placed in Patient's room. Name: Kristina Wilkinson MRN: [id number] Date of Birth: Oct 17, 1927   Medicare Important Message Given:  Yes     Kerin Salen 10/03/2020, 1:27 PM

## 2020-10-03 NOTE — TOC Progression Note (Signed)
Transition of Care [facility]) - Progression Note    Patient Details  Name: MADINE WOODRUM MRN: [id number] Date of Birth: 1927-05-16  Transition of [facility]) CM/SW Contact  Johnanthony Wilden, Juliann Pulse, RN Phone Number: 10/03/2020, 4:09 PM  Clinical Narrative:  PT recc SNF-Received bed offers-await choice from son [name] through a call back.     Expected Discharge Plan: [place] Barriers to Discharge: Continued Medical Work up  Expected Discharge Plan and Services Expected Discharge Plan: [place] arrangements for the past 2 months: Single Family Home                                       Social Determinants of Health (SDOH) Interventions    Readmission Risk Interventions No flowsheet data found.

## 2020-10-04 DIAGNOSIS — I4891 Unspecified atrial fibrillation: Secondary | ICD-10-CM | POA: Diagnosis not present

## 2020-10-04 LAB — GLUCOSE, CAPILLARY
Glucose-Capillary: 99 mg/dL (ref 70–99)
Glucose-Capillary: 156 mg/dL — ABNORMAL HIGH (ref 70–99)

## 2020-10-04 NOTE — Progress Notes (Signed)
Physical Therapy Treatment Patient Details Name: Kristina Wilkinson MRN: [id number] DOB: [birth year] Today's Date: 10/04/2020    History of Present Illness Pt admitted from home 2* multiple falls, weakness and c/o L knee, L shoulder and hip pain - on imaging no acute findings.  Pt with hx of DM, LBP, L TKR, cerebral aneurysm repair, peripheral neuropathy, and multiple falls -"I call the fire department to get me up"    PT Comments    Pt is OOB in recliner.  General Comments: AxO x 3 but VERY HOH.  Lives home alone.  "I want my Rehab at [facility]", vs facility.  "I can do better in my own home". General transfer comment: pt more able to self rise using B UE's to push self up to standing.  No posterior LOB this session.  Also assisted on/off toilet level.  Pt did require increased assist off lower level.  Still present with unsteadiness esp with turns and back gait.  Pt using heavy stability using B UE's "furniture".  Also present with mild dyskenesia. General Gait Details: improved gait from prior session however still required Min Assist for gait instability esp with turns and backward steps.  Tolerated an increased distance to and from bathroom.  "I can walk better in my home", stated pt.  "I have everything where I can get to".  Improvement from 2 days prior however still would NOT rec pt amb on her own.  Balance is poor and self correction responce is delayed. Pt refusing to go to a facility. Has a Son but he lives in [place].  In which case she will need 24/7 initial care.    Follow Up Recommendations  SNF     Equipment Recommendations  None recommended by PT    Recommendations for Other Services       Precautions / Restrictions Precautions Precautions: Fall Precaution Comments: multiple falls at home Restrictions Weight Bearing Restrictions: No    Mobility  Bed Mobility               General bed mobility comments: OOB in recliner    Transfers Overall transfer level: Needs  assistance Equipment used: Rolling walker (2 wheeled) Transfers: Sit to/from [facility] Sit to Stand: Min assist;Min guard Stand pivot transfers: Min assist       General transfer comment: pt more able to self rise using B UE's to push self up to standing.  No posterior LOB this session.  Also assisted on/off toilet level.  Pt did require increased assist off lower level.  Still present with unsteadiness esp with turns and back gait.  Pt using heavy stability using B UE's "furniture".  Also present with mild dyskenesia.  Ambulation/Gait Ambulation/Gait assistance: Min assist Gait Distance (Feet): 30 Feet (15 feet x 2 to and from bathroom) Assistive device: Rolling walker (2 wheeled) Gait Pattern/deviations: Shuffle;Trunk flexed;Decreased stride length Gait velocity: decreased   General Gait Details: improved gait from prior session however still required [place] for gait instability esp with turns and backward steps.  Tolerated an increased distance to and from bathroom.  "I can walk better in my home", stated pt.  "I have everything where I can get to".  Improvement from 2 days prior however still would NOT rec pt amb on her own.  Balance is poor and self correction responce is delayed.   Stairs             Wheelchair Mobility    Modified Rankin (Stroke Patients Only)  Balance                                            Cognition Arousal/Alertness: Awake/alert Behavior During Therapy: WFL for tasks assessed/performed                                   General Comments: AxO x 3 but VERY HOH.  Lives home alone.  "I want my Rehab at [place]", vs facility.  "I can do better in my own home".      Exercises      General Comments        Pertinent Vitals/Pain Pain Assessment: No/denies pain    Home Living                      Prior Function            PT Goals (current goals can now be found in the  care plan section) Progress towards PT goals: Progressing toward goals    Frequency    Min 2X/week      PT Plan Current plan remains appropriate    Co-evaluation              AM-PAC PT "6 Clicks" Mobility   Outcome Measure  Help needed turning from your back to your side while in a flat bed without using bedrails?: A Little Help needed moving from lying on your back to sitting on the side of a flat bed without using bedrails?: A Little Help needed moving to and from a bed to a chair (including a wheelchair)?: A Little Help needed standing up from a chair using your arms (e.g., wheelchair or bedside chair)?: A Little Help needed to walk in hospital room?: A Little Help needed climbing 3-5 steps with a railing? : Total 6 Click Score: 16    End of Session Equipment Utilized During Treatment: Gait belt Activity Tolerance: Patient tolerated treatment well Patient left: in chair;with call bell/phone within reach;with chair alarm set Nurse Communication: Mobility status PT Visit Diagnosis: Unsteadiness on feet (R26.81);Muscle weakness (generalized) (M62.81);History of falling (Z91.81)     Time: [id number] PT Time Calculation (min) (ACUTE ONLY): 28 min  Charges:  $Gait Training: 8-22 mins $Therapeutic Activity: 8-22 mins                     Rica Koyanagi  PTA Acute  Rehabilitation Services Pager      [phone] Office      [phone]

## 2020-10-04 NOTE — Progress Notes (Signed)
PROGRESS NOTE    Kristina Wilkinson  [id number] DOB: [birth year] DOA: 09/25/2020 PCP: Pcp, No   Brief Narrative:  Kristina Wilkinson is a 87 y.o. female with PMH significant for DM2, HTN, debility, recurrent falls, CNS aneurysm s/p clipping, DVT (5/21) s/p eliquis x 3 months who lives alone. Patient presented to the ED from home with complaint of progressive generalized weakness leading to a fall on both her knees leading to bruising and left-sided rib pain.   In the ED, patient was noted to be in A. fib with RVR, started on Cardizem drip and admitted to hospital service. See below for details  Assessment & Plan:   Principal Problem:   New onset a-fib ([place]) Active Problems:   HTN (hypertension)   Diabetes mellitus without complication (HCC)   Abnormal urinalysis   Leukocytosis   Falls   New onset A. fib with RVR ? Initially placed on Cardizem drip.  After conversion to sinus rhythm, patient was switched to oral metoprolol.  Currently stable on Toprol at 12.5 mg daily.  Continue to monitor in telemetry. ? CHADSVASC score at least 6 putting her at high risk of stroke.  Per history, she tolerated a 89-monthcourse of Eliquis in May 2021 for DVT.  Patient agreed to long-term anticoagulation at this time because of A. fib.  Eliquis was started.   Hypertensive urgency: Blood pressure fairly controlled for her age.  Continue Toprol 12.5 mg daily, lisinopril 40 mg daily and amlodipine 10 mg daily.  Continue to monitor   Diabetes mellitus ? Well-controlled, A1c 6.1.  Continue SSI.   UTI ? Urinalysis showed large amount of leukocytes, positive nitrates and many bacteria.  Treated with a dose of fosfomycin.   Generalized weakness/recurrent falls: PT OT recommends SNF.  Patient very adamant on going home.  She does not even want home health.  Patient 87year old and lives alone.  [place] including recent stroke and has dysarthria and  cannot take care of himself.  Spent long time trying to convince her and counselor however she is very adamant on her decision.  Eventually I asked her if I could talk to her family member and she granted permission to talk to her son [name]  I spoke to him however he also had stroke recently and he has significant dysarthria and he is unable to care for himself and he referred me to talk to patient's best friend, who is also her nephew [name] however he mentioned that we should do what is best for the patient.  I spoke to [place] he informed me that for the last 2 to 3 weeks, patient was even unable to get up and she kept calling fire department almost on daily basis.  I informed him about recommendations for SNF.  He agreed with that.  He also was on the same page that patient should not go home as she is not doing well.  I requested him to come talk to the patient and convince her to go to rehab.  So he did and patient was agreeable to go to SNF on Sunday but then once again she reiterated that she only wants to go home.  I do not think that patient has mental capacity to make the right decision for herself.  PT and OT constantly are recommending.  TOC working on finding a bed.  DVT prophylaxis:    Code Status: Full Code  Family Communication: None present at bedside.  Spoke to nephew 2 days ago.  Called the daughter/the information in the demographics however she told me that she is not the daughter but patient likes to call the daughter and she actually deferred me to have the conversation with the nephew as well.  Status is: Inpatient  Remains inpatient appropriate because:Unsafe d/c plan  Dispo: The patient is from: Home              Anticipated d/c is to: SNF              Patient currently is medically stable to d/c.   Difficult to place patient No        Estimated body mass index is 25.66 kg/m as calculated from the following:   Height as of this encounter: '5\' 3"'$  (1.6 m).    Weight as of this encounter: 65.7 kg.  Pressure Injury 03/07/19 Head Posterior Stage 2 -  Partial thickness loss of dermis presenting as a shallow open injury with a red, pink wound bed without slough. (Active)  03/07/19 1000  Location: Head  Location Orientation: Posterior  Staging: Stage 2 -  Partial thickness loss of dermis presenting as a shallow open injury with a red, pink wound bed without slough.  Wound Description (Comments):   Present on Admission: Yes    Nutritional Assessment: Body mass index is 25.66 kg/m.Marland Kitchen Seen by dietician.  I agree with the assessment and plan as outlined below: Nutrition Status:        .  Skin Assessment: I have examined the patient's skin and I agree with the wound assessment as performed by the wound care RN as outlined below: Pressure Injury 03/07/19 Head Posterior Stage 2 -  Partial thickness loss of dermis presenting as a shallow open injury with a red, pink wound bed without slough. (Active)  03/07/19 1000  Location: Head  Location Orientation: Posterior  Staging: Stage 2 -  Partial thickness loss of dermis presenting as a shallow open injury with a red, pink wound bed without slough.  Wound Description (Comments):   Present on Admission: Yes    Consultants:  None  Procedures:  None  Antimicrobials:  Anti-infectives (From admission, onward)    Start     Dose/Rate Route Frequency Ordered Stop   09/26/20 1000  nitrofurantoin (macrocrystal-monohydrate) (MACROBID) capsule 100 mg  Status:  Discontinued        100 mg Oral Every 12 hours 09/26/20 0734 09/26/20 0735   09/26/20 0800  fosfomycin (MONUROL) packet 3 g        3 g Oral  Once 09/26/20 0735 09/26/20 0936          Subjective: Seen and examined.  No complaints.  Objective: Vitals:   10/03/20 1301 10/03/20 2111 10/04/20 0426 10/04/20 0443  BP: 133/70 (!) 174/53 122/65   Pulse: 66 77 61   Resp: '16 16 16   '$ Temp: 98 F (36.7 C) 98.7 F (37.1 C) 97.7 F (36.5 C)    TempSrc: Oral Oral Axillary   SpO2: 99% 99% 99%   Weight:    65.7 kg  Height:        Intake/Output Summary (Last 24 hours) at 10/04/2020 1327 Last data filed at 10/04/2020 0917 Gross per 24 hour  Intake 690 ml  Output 700 ml  Net -10 ml    Filed Weights   10/02/20 0527 10/03/20 0438 10/04/20 0443  Weight: 67.1 kg 64.1 kg 65.7 kg    Examination:  General exam: Appears calm and comfortable  Respiratory system: Clear to auscultation. Respiratory effort normal. Cardiovascular system: S1 & S2 heard, RRR. No JVD, murmurs, rubs, gallops or clicks. No pedal edema. Gastrointestinal system: Abdomen is nondistended, soft and nontender. No organomegaly or masses felt. Normal bowel sounds heard. Central nervous system: Alert and oriented. No focal neurological deficits. Extremities: Symmetric 5 x 5 power. Skin: No rashes, lesions or ulcers.  Psychiatry: Judgement and insight appear poor, mood & affect appropriate.    Data Reviewed: I have personally reviewed following labs and imaging studies  CBC: Recent Labs  Lab 09/30/20 0511  WBC 6.8  NEUTROABS 4.1  HGB 11.4*  HCT 35.7*  MCV 93.7  PLT [id number]    Basic Metabolic Panel: Recent Labs  Lab 09/30/20 0511  NA 141  K 3.6  CL 112*  CO2 24  GLUCOSE 100*  BUN 19  CREATININE 0.72  CALCIUM 9.3    GFR: Estimated Creatinine Clearance: 41.7 mL/min (by C-G formula based on SCr of 0.72 mg/dL). Liver Function Tests: No results for input(s): AST, ALT, ALKPHOS, BILITOT, PROT, ALBUMIN in the last 168 hours. No results for input(s): LIPASE, AMYLASE in the last 168 hours. No results for input(s): AMMONIA in the last 168 hours. Coagulation Profile: No results for input(s): INR, PROTIME in the last 168 hours.  Cardiac Enzymes: No results for input(s): CKTOTAL, CKMB, CKMBINDEX, TROPONINI in the last 168 hours.  BNP (last 3 results) No results for input(s): PROBNP in the last 8760 hours. HbA1C: No results for input(s): HGBA1C in the  last 72 hours. CBG: Recent Labs  Lab 10/02/20 1253 10/02/20 1751 10/02/20 1959 10/03/20 1107 10/03/20 2114  GLUCAP 176* 106* 163* 142* 102*    Lipid Profile: No results for input(s): CHOL, HDL, LDLCALC, TRIG, CHOLHDL, LDLDIRECT in the last 72 hours. Thyroid Function Tests: No results for input(s): TSH, T4TOTAL, FREET4, T3FREE, THYROIDAB in the last 72 hours. Anemia Panel: No results for input(s): VITAMINB12, FOLATE, FERRITIN, TIBC, IRON, RETICCTPCT in the last 72 hours. Sepsis Labs: No results for input(s): PROCALCITON, LATICACIDVEN in the last 168 hours.  Recent Results (from the past 240 hour(s))  Resp Panel by RT-PCR (Flu A&B, Covid) Nasopharyngeal Swab     Status: None   Collection Time: 09/25/20  7:47 AM   Specimen: Nasopharyngeal Swab; Nasopharyngeal(NP) swabs in vial transport medium  Result Value Ref Range Status   SARS Coronavirus 2 by RT PCR NEGATIVE NEGATIVE Final    Comment: (NOTE) SARS-CoV-2 target nucleic acids are NOT DETECTED.  The SARS-CoV-2 RNA is generally detectable in upper respiratory specimens during the acute phase of infection. The lowest concentration of SARS-CoV-2 viral copies this assay can detect is 138 copies/mL. A negative result does not preclude SARS-Cov-2 infection and should not be used as the sole basis for treatment or other patient management decisions. A negative result may occur with  improper specimen collection/handling, submission of specimen other than nasopharyngeal swab, presence of viral mutation(s) within the areas targeted by this assay, and inadequate number of viral copies(<138 copies/mL). A negative result must be combined with clinical observations, patient history, and epidemiological information. The expected result is Negative.  Fact Sheet for Patients:  EntrepreneurPulse.com.au  Fact Sheet for Healthcare Providers:  IncredibleEmployment.be  This test is no t yet approved or  cleared by the [place] FDA and  has been authorized for detection and/or diagnosis of SARS-CoV-2 by FDA under an Emergency Use Authorization (EUA). This EUA will remain  in effect (meaning this  test can be used) for the duration of the COVID-19 declaration under Section 564(b)(1) of the Act, 21 U.S.C.section 360bbb-3(b)(1), unless the authorization is terminated  or revoked sooner.       Influenza A by PCR NEGATIVE NEGATIVE Final   Influenza B by PCR NEGATIVE NEGATIVE Final    Comment: (NOTE) The Xpert Xpress SARS-CoV-2/FLU/RSV plus assay is intended as an aid in the diagnosis of influenza from Nasopharyngeal swab specimens and should not be used as a sole basis for treatment. Nasal washings and aspirates are unacceptable for Xpert Xpress SARS-CoV-2/FLU/RSV testing.  Fact Sheet for Patients: EntrepreneurPulse.com.au  Fact Sheet for Healthcare Providers: IncredibleEmployment.be  This test is not yet approved or cleared by the [place] FDA and has been authorized for detection and/or diagnosis of SARS-CoV-2 by FDA under an Emergency Use Authorization (EUA). This EUA will remain in effect (meaning this test can be used) for the duration of the COVID-19 declaration under Section 564(b)(1) of the Act, 21 U.S.C. section 360bbb-3(b)(1), unless the authorization is terminated or revoked.  Performed at [facility], [place] [address], [place], [place] [address]        Radiology Studies: No results found.  Scheduled Meds:  amLODipine  10 mg Oral Daily   apixaban  5 mg Oral BID   insulin aspart  0-9 Units Subcutaneous TID WC   lisinopril  40 mg Oral Daily   metoprolol succinate  12.5 mg Oral Daily   Continuous Infusions:   LOS: 8 days   Time spent: 28 minutes   Darliss Cheney, MD [facility]  10/04/2020, 1:27 PM   How to contact the [facility] Attending or Consulting provider [place] or covering provider  during after hours [place], for this patient?  Check the care team in [facility] and look for a) attending/consulting TRH provider listed and b) the [facility] team listed. Page or secure chat [phone]. Log into [email or web address] and use [employer]'s universal password to access. If you do not have the password, please contact the hospital operator. Locate the [facility] provider you are looking for under [facility] and page to a number that you can be directly reached. If you still have difficulty reaching the provider, please page the [facility] (Director on Call) for the Hospitalists listed on amion for assistance.

## 2020-10-04 NOTE — Plan of Care (Signed)
  Problem: Education: Goal: Knowledge of General Education information will improve Description: Including pain rating scale, medication(s)/side effects and non-pharmacologic comfort measures Outcome: Adequate for Discharge   Problem: Clinical Measurements: Goal: Will remain free from infection Outcome: Completed/Met

## 2020-10-04 NOTE — TOC Progression Note (Addendum)
Transition of Care [facility]) - Progression Note    Patient Details  Name: Kristina Wilkinson MRN: [id number] Date of Birth: May 22, 1927  Transition of Care [facility]) CM/SW Contact  Cecil Cobbs Phone Number: 10/04/2020, 10:47 AM  Clinical Narrative:     CSW spoke to patient's son [name] to ask which facility he would like his mom to go to.  CSW informed him that patient is medically ready for discharge per physician.  Patient's son stated, "I don't know anything about the facilities, I live in [place] and haven't been back there in 3 years."  CSW asked if he had a preference for where to send her and then yelled, "I don't give a damn where she goes."  CSW asked son if there is anyone else CSW can call, and he said no.  CSW then asked patient's son, how do you want to proceed on choosing a facility, and then patient hung up on CSW.  12:15pm  CSW attempted to contact patient's friend [name] [phone], had to leave a message awaiting for call back.   Expected Discharge Plan: [place] Barriers to Discharge: Continued Medical Work up  Expected Discharge Plan and Services Expected Discharge Plan: [place] arrangements for the past 2 months: Single Family Home                                       Social Determinants of Health (SDOH) Interventions    Readmission Risk Interventions No flowsheet data found.

## 2020-10-04 NOTE — Plan of Care (Signed)
  Problem: Education: Goal: Knowledge of General Education information will improve Description: Including pain rating scale, medication(s)/side effects and non-pharmacologic comfort measures Outcome: Adequate for Discharge   

## 2020-10-04 NOTE — Progress Notes (Signed)
Occupational Therapy Treatment Patient Details Name: Kristina Wilkinson MRN: [id number] DOB: 11/19/1927 Today's Date: 10/04/2020    History of present illness Pt admitted from home 2* multiple falls, weakness and c/o L knee, L shoulder and hip pain - on imaging no acute findings.  Pt with hx of DM, LBP, L TKR, cerebral aneurysm repair, peripheral neuropathy, and multiple falls -"I call the fire department to get me up"   OT comments  Patient participated in bathing tasks sitting on edge of bed with no LOB. Patient was able to transfer with one person on this date with max cues for sequencing of task from edge of bed to recliner. Patient continued to be recommended to go to SNF at time of d/c with increased need for assistance with ADL tasks. Patient's discharge plan remains appropriate at this time. OT will continue to follow acutely.    Follow Up Recommendations  SNF    Equipment Recommendations  None recommended by OT    Recommendations for Other Services      Precautions / Restrictions Precautions Precautions: Fall Precaution Comments: multiple falls at home, [facility] Restrictions Weight Bearing Restrictions: No       Mobility Bed Mobility Overal bed mobility: Needs Assistance Bed Mobility: Supine to Sit     Supine to sit: Min guard     General bed mobility comments: OOB in recliner    Transfers Overall transfer level: Needs assistance Equipment used: Rolling walker (2 wheeled) Transfers: Sit to/from [facility] Sit to Stand: Min assist;Min guard Stand pivot transfers: Min assist       General transfer comment: pt more able to self rise using B UE's to push self up to standing.  No posterior LOB this session.  Also assisted on/off toilet level.  Pt did require increased assist off lower level.  Still present with unsteadiness esp with turns and back gait.  Pt using heavy stability using B UE's "furniture".  Also present with mild dyskenesia.    Balance  Overall balance assessment: History of Falls Sitting-balance support: No upper extremity supported;Feet unsupported Sitting balance-Leahy Scale: Good     Standing balance support: Bilateral upper extremity supported Standing balance-Leahy Scale: Poor                             ADL either performed or assessed with clinical judgement   ADL           Upper Body Bathing: Set up Upper Body Bathing Details (indicate cue type and reason): sitting on edge of bed with BLE supported. Lower Body Bathing: Sitting/lateral leans;Moderate assistance Lower Body Bathing Details (indicate cue type and reason): sitting on edge of bed with education to participate in tasks Upper Body Dressing : Minimal assistance;Sitting Upper Body Dressing Details (indicate cue type and reason): patient required cues for sequencing of tasks                   General ADL Comments: patient transfered from edge of bed to recliner in room with min A with RW with max verbal cues to complete task.     Vision       Perception     Praxis      Cognition Arousal/Alertness: Awake/alert Behavior During Therapy: WFL for tasks assessed/performed Overall Cognitive Status: Within Functional Limits for tasks assessed  General Comments: AxO x 3 but VERY HOH.  Lives home alone.  "I want my Rehab at [facility] [place] [facility]", vs facility.  "I can do better in my own home".        Exercises     Shoulder Instructions       General Comments      Pertinent Vitals/ Pain       Pain Assessment: No/denies pain  Home Living                                          Prior Functioning/Environment              Frequency  Min 2X/week        Progress Toward Goals  OT Goals(current goals can now be found in the care plan section)  Progress towards OT goals: Progressing toward goals  Acute Rehab OT Goals Patient Stated Goal: to get back home  Plan  Discharge plan remains appropriate    Co-evaluation                 AM-PAC OT "6 Clicks" Daily Activity     Outcome Measure   Help from another person eating meals?: A Little Help from another person taking care of personal grooming?: A Little Help from another person toileting, which includes using toliet, bedpan, or urinal?: A Lot Help from another person bathing (including washing, rinsing, drying)?: A Lot Help from another person to put on and taking off regular upper body clothing?: A Little Help from another person to put on and taking off regular lower body clothing?: A Lot 6 Click Score: 15    End of Session Equipment Utilized During Treatment: Rolling walker;Gait belt  OT Visit Diagnosis: Muscle weakness (generalized) (M62.81);History of falling (Z91.81)   Activity Tolerance Patient tolerated treatment well   Patient Left in chair;with call bell/phone within reach;with chair alarm set   Nurse Communication Other (comment) (nurse cleared patient to participate on this date.)        Time: 0801-0834 OT Time Calculation (min): 33 min  Charges: OT General Charges $OT Visit: 1 Visit OT Treatments $Self Care/Home Management : 23-37 mins  Jackelyn Poling OTR/L, MS Acute Rehabilitation Department Office# [phone] Pager# [phone]    [place] 10/04/2020, 1:14 PM

## 2020-10-05 DIAGNOSIS — R8290 Unspecified abnormal findings in urine: Secondary | ICD-10-CM

## 2020-10-05 DIAGNOSIS — W19XXXS Unspecified fall, sequela: Secondary | ICD-10-CM

## 2020-10-05 DIAGNOSIS — D72829 Elevated white blood cell count, unspecified: Secondary | ICD-10-CM

## 2020-10-05 DIAGNOSIS — I10 Essential (primary) hypertension: Secondary | ICD-10-CM

## 2020-10-05 DIAGNOSIS — E119 Type 2 diabetes mellitus without complications: Secondary | ICD-10-CM

## 2020-10-05 LAB — GLUCOSE, CAPILLARY
Glucose-Capillary: 111 mg/dL — ABNORMAL HIGH (ref 70–99)
Glucose-Capillary: 106 mg/dL — ABNORMAL HIGH (ref 70–99)
Glucose-Capillary: 107 mg/dL — ABNORMAL HIGH (ref 70–99)
Glucose-Capillary: 124 mg/dL — ABNORMAL HIGH (ref 70–99)
Glucose-Capillary: 104 mg/dL — ABNORMAL HIGH (ref 70–99)
Glucose-Capillary: 157 mg/dL — ABNORMAL HIGH (ref 70–99)
Glucose-Capillary: 91 mg/dL (ref 70–99)
Glucose-Capillary: 148 mg/dL — ABNORMAL HIGH (ref 70–99)

## 2020-10-05 NOTE — Plan of Care (Signed)
  Problem: Clinical Measurements: Goal: Will remain free from infection Outcome: Completed/Met   Problem: Coping: Goal: Level of anxiety will decrease Outcome: Completed/Met

## 2020-10-05 NOTE — TOC Progression Note (Signed)
Transition of Care [facility]) - Progression Note    Patient Details  Name: Kristina Wilkinson MRN: [id number] Date of Birth: Jul 07, 1927  Transition of Care [facility]) CM/SW Contact  Kristina Wilkinson,  Phone Number: 10/05/2020, 3:15 PM  Clinical Narrative:     CSW spoke to Kristina Wilkinson who was labelled as a daughter, but clarified, that patient is not her mother.  Kristina Wilkinson just works for patient at the house.  Per Kristina Wilkinson, patient has been to different SNFs and had bad experiences at them.  She said normally patient is independent, able to cook her own meals, and able to get around her home safely.  CSW explained that PT is recommending SNF for patient, and psych says she does not have capacity to make decisions in regards to going to SNF or HH.  Kristina Wilkinson, said that patient's brother just had a very bad stroke and not really able to understand what is going on.  Patient usually defers to her nephew Kristina Wilkinson to help with making decisions.  CSW informed Kristina Wilkinson, that CSW has tried to get in touch with Kristina Wilkinson and had to leave messages for him to call back to help choose a SNF for patient or say they would like her to go home with home health.  CSW explained a decision just needs to be made on where patient is going to go either snf or home health.  CSW provided list of SNF offers to Kristina Wilkinson, she said she will try to get in touch with Kristina Wilkinson and also review bed offers with patient.  CSW explained to Kristina Wilkinson that because patient has not been vaccinated, it will limit choices of where she can go.  CSW also emailed a list of bed offers to Kristina Wilkinson so they can help patient make a decision on where she is willing to go.  CSW requested they let this CSW know as soon as possible or if they would rather just have home health services.  CSW to continue to facilitate discharge planning.   Expected Discharge Plan: Kristina Wilkinson Barriers to Discharge: Continued Medical Work up  Expected Discharge Plan and Services Expected  Discharge Plan: [place] arrangements for the past 2 months: Single Family Home                                       Social Determinants of Health (SDOH) Interventions    Readmission Risk Interventions No flowsheet data found.

## 2020-10-05 NOTE — Progress Notes (Signed)
PROGRESS NOTE    Kristina Wilkinson  [id number] DOB: [birth year] DOA: 09/25/2020 PCP: Pcp, No   Brief Narrative:  The patient is a 87 year old elderly female with a past medical history significant for but not limited to diabetes mellitus type 2, hypertension, debility, recurrent falls, history of CNS aneurysm status post clipping, history of DVT back in May 2021 status post Eliquis for 3 months and other comorbidities.  Patient lives alone and she presented to the ED from home with a chief complaint of progressive generalized weakness leading to a fall in both her knees there is bruising and left-sided rib pain.  In the ED she was noted to be in A. fib with RVR she was started on a Cardizem drip and admitted to the hospitalist service.  Subsequently after being placed on a Cardizem drip she converted to sinus rhythm and subsequently changed to oral metoprolol.  Her CHA2DS2-VASc score is at least 6 and she is now on long-term anticoagulation with Eliquis.  Her blood pressure was elevated and is now fairly well controlled with metoprolol, lisinopril and amlodipine.  Urinalysis showed large amount leukocytes plus nitrates and she was treated with dose of fosfomycin.  Given her recurrent falls and generalized weakness PT OT recommended SNF but she is very adamant in going home.  She does not have the medical capacity decision-making to refuse SNF at this time and will need SNF placement.  Assessment & Plan:   Principal Problem:   New onset a-fib (Kristina Wilkinson) Active Problems:   HTN (hypertension)   Diabetes mellitus without complication (HCC)   Abnormal urinalysis   Leukocytosis   Falls  New onset A. fib with RVR -Initially placed on Cardizem drip.  After conversion to sinus rhythm, patient was switched to oral metoprolol.   -Currently stable on Toprol at 12.5 mg daily.  Continue to monitor in telemetry. -CHADSVASC score at least 6 putting her at high risk of stroke.  Per history, she tolerated a 2-month course of Eliquis in May 2021 for DVT.   -Patient agreed to long-term anticoagulation at this time because of A. fib.  Eliquis was started.   Hypertensive urgency -Blood pressure fairly controlled for her age.   -Continue Toprol 12.5 mg daily, lisinopril 40 mg daily and amlodipine 10 mg daily.   -Continue to monitor BP per Protocol   Diabetes Mellitus Type 2 -Well-controlled, A1c 6.1.  Continue SSI.   UTI -Urinalysis showed large amount of leukocytes, positive nitrates and many bacteria.   -Treated with a dose of fosfomycin.   Generalized Weakness/Recurrent Falls -PT OT recommends SNF.  Patient very adamant on going home.  She does not even want home health.  Patient 87year old and lives alone.  [place] including recent stroke and has dysarthria and cannot take care of himself.  Dr. PDoristine Bosworthspent long time trying to convince her and counselor however she is very adamant on her decision.  Eventually Dr. PDoristine Bosworthasked her if he could talk to her family member and she granted permission to talk to her son [name]  I spoke to him however he also had stroke recently and he has significant dysarthria and he is unable to care for himself and he referred me to talk to patient's best friend, who is also her nephew BMelodie Wilkinson however he mentioned that we should do what is best for the patient.  Dr. 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  Dr. Doristine Bosworth informed him about recommendations for SNF.  He agreed with that.  He also was on the same page that patient should not go home as she is not doing well.  Dr. Doristine Bosworth requested him to come talk to the patient and convince her to go to rehab.  So he did and patient was agreeable to go to SNF on 'Sunday but then once again she reiterated that she only wants to go home.  She does  not have has mental capacity to make the right decision for herself and this was confirmed by Psych Consultation -PT and OT constantly are recommending SNF.  TOC working on finding a bed. -Palliative Care consulted for GOC Discussion    DVT prophylaxis: Anticoagulated with Apixaban 5 mg po BID  Code Status: FULL CODE Family Communication: No family present at bedside Disposition Plan:   Status is: Inpatient  Remains inpatient appropriate because:Unsafe d/c plan, IV treatments appropriate due to intensity of illness or inability to take PO, and Inpatient level of care appropriate due to severity of illness  Dispo: The patient is from: Home              Anticipated d/c is to: SNF              Patient currently is medically stable to d/c.   Difficult to place patient No  Consultants:  Psychiatry Palliative Care    Procedures: None  Antimicrobials:  Anti-infectives (From admission, onward)    Start     Dose/Rate Route Frequency Ordered Stop   09/26/20 1000  nitrofurantoin (macrocrystal-monohydrate) (MACROBID) capsule 100 mg  Status:  Discontinued        10'$ 0 mg Oral Every 12 hours 09/26/20 0734 09/26/20 0735   09/26/20 0800  fosfomycin (MONUROL) packet 3 g        3 g Oral  Once 09/26/20 0735 09/26/20 0936        Subjective: Seen and examined at bedside and she had no complaints but wanted to go home.  No chest pain or shortness of breath.  Feels okay.  Denies any lightheadedness or dizziness.  No other concerns or complaints at this time.  Objective: Vitals:   10/04/20 1343 10/04/20 2107 10/05/20 0438 10/05/20 1340  BP: 124/61 136/60 (!) 143/58 (!) 144/64  Pulse: 63 72 66 67  Resp: '16 16 14 18  '$ Temp: (!) 97.5 F (36.4 C) 97.6 F (36.4 C) 98.4 F (36.9 C) 97.7 F (36.5 C)  TempSrc:  Oral Oral Oral  SpO2: 99% 99% 97% 100%  Weight:   65.4 kg   Height:        Intake/Output Summary (Last 24 hours) at 10/05/2020 1553 Last data filed at 10/05/2020 1300 Gross per 24 hour   Intake 670 ml  Output 1475 ml  Net -805 ml   Filed Weights   10/03/20 0438 10/04/20 0443 10/05/20 0438  Weight: 64.1 kg 65.7 kg 65.4 kg   Examination: Physical Exam:  Constitutional: WN/WD overweight elderly Caucasian female in NAD and appears a  little anxious Eyes: Lids and conjunctivae normal, sclerae anicteric  ENMT: External Ears, Nose appear normal. Grossly normal hearing.  Neck: Appears normal, supple, no cervical masses, normal ROM, no appreciable thyromegaly; no JVD Respiratory: Diminished to auscultation bilaterally, no wheezing, rales, rhonchi or crackles. Normal respiratory effort and patient is not tachypenic.  No accessory muscle use. Unlabored breathing  Cardiovascular: RRR, no murmurs / rubs / gallops. S1 and S2 auscultated.  Abdomen: Soft, non-tender, Distended 2/2 body habitus. Bowel sounds positive.  GU: Deferred. Musculoskeletal: No clubbing / cyanosis of digits/nails. No joint deformity upper and lower extremities.  Skin: No rashes, lesions, ulcers on a limited skin evaluation. No induration; Warm and dry.  Neurologic: CN 2-12 grossly intact with no focal deficits. Romberg sign and cerebellar reflexes not assessed.  Psychiatric: Normal judgment and insight. Anxious mood  Data Reviewed: I have personally reviewed following labs and imaging studies  CBC: Recent Labs  Lab 09/30/20 0511  WBC 6.8  NEUTROABS 4.1  HGB 11.4*  HCT 35.7*  MCV 93.7  PLT [id number]   Basic Metabolic Panel: Recent Labs  Lab 09/30/20 0511  NA 141  K 3.6  CL 112*  CO2 24  GLUCOSE 100*  BUN 19  CREATININE 0.72  CALCIUM 9.3   GFR: Estimated Creatinine Clearance: 41.7 mL/min (by C-G formula based on SCr of 0.72 mg/dL). Liver Function Tests: No results for input(s): AST, ALT, ALKPHOS, BILITOT, PROT, ALBUMIN in the last 168 hours. No results for input(s): LIPASE, AMYLASE in the last 168 hours. No results for input(s): AMMONIA in the last 168 hours. Coagulation Profile: No results  for input(s): INR, PROTIME in the last 168 hours. Cardiac Enzymes: No results for input(s): CKTOTAL, CKMB, CKMBINDEX, TROPONINI in the last 168 hours. BNP (last 3 results) No results for input(s): PROBNP in the last 8760 hours. HbA1C: No results for input(s): HGBA1C in the last 72 hours. CBG: Recent Labs  Lab 10/04/20 1113 10/04/20 1558 10/04/20 2109 10/05/20 0748 10/05/20 1145  GLUCAP 124* 99 156* 104* 157*   Lipid Profile: No results for input(s): CHOL, HDL, LDLCALC, TRIG, CHOLHDL, LDLDIRECT in the last 72 hours. Thyroid Function Tests: No results for input(s): TSH, T4TOTAL, FREET4, T3FREE, THYROIDAB in the last 72 hours. Anemia Panel: No results for input(s): VITAMINB12, FOLATE, FERRITIN, TIBC, IRON, RETICCTPCT in the last 72 hours. Sepsis Labs: No results for input(s): PROCALCITON, LATICACIDVEN in the last 168 hours.  No results found for this or any previous visit (from the past 240 hour(s)).   RN Pressure Injury Documentation: Pressure Injury 03/07/19 Head Posterior Stage 2 -  Partial thickness loss of dermis presenting as a shallow open injury with a red, pink wound bed without slough. (Active)  03/07/19 1000  Location: Head  Location Orientation: Posterior  Staging: Stage 2 -  Partial thickness loss of dermis presenting as a shallow open injury with a red, pink wound bed without slough.  Wound Description (Comments):   Present on Admission: Yes   Estimated body mass index is 25.54 kg/m as calculated from the following:   Height as of this encounter: '5\' 3"'$  (1.6 m).   Weight as of this encounter: 65.4 kg.  Malnutrition Type:   Malnutrition Characteristics:   Nutrition Interventions:    Radiology Studies: No results found.  Scheduled Meds:  amLODipine  10 mg Oral Daily   apixaban  5 mg Oral BID   insulin aspart  0-9 Units Subcutaneous TID WC   lisinopril  40 mg Oral Daily   metoprolol succinate  12.5 mg Oral Daily   Continuous Infusions:   LOS: 9 days    Kerney Elbe, DO [facility] PAGER is on AMION  If 7PM-7AM, please contact night-coverage [email or web address]

## 2020-10-05 NOTE — Consult Note (Signed)
Kristina Wilkinson is a 87 year old female with medical history significant of recurrent falls due to debility, diabetes, aneurysm status post clipping, DVT status post Eliquis x3 months, who presents to the emergency department status post fall requiring fire department left service.  Patient has been in the hospital for 10 days, psychiatric consult was placed for capacity evaluation.  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    Mental Capacity Assessment: I have evaluated the following areas to assess the [name]'s mental capacity regarding medical decision-making ability which pertains to competency to accept or refuse medical treatment.  The specific treatment or service in question is: Skilled nursing facility placement.  Communication: The patient was unable to clearly state her preferred treatment options for medical care at this time.  Patient only statement consisted of her demanding to return home.  "  I want to return home.  Let me spend my last few days at home and the comfort of family and friends.  I have been to several nursing facilities and I did not wish to return.  I completed almost 35 weeks of in-home rehabilitation, and continue to fall.  The patient was unable to decide her reasons for going to a skilled nursing facility.  Factors that could compromise this communication process include: nephrotoxicity, hypothermia, and urinary tract infection.   Understanding: The patient was able to recall information, link causal relationships, and process general probabilities regarding life situations and medical treatment scenarios "I kept falling so I had to call the fire department.  But I was doing okay before.  I only called them when I needed help." She was able to paraphrase her view of the current  situation and her thoughts about it including  future-oriented scenarios "I want to go home.  I want to spend my last days at home.  I have everything set up at my house the way that I wanted to be.  I do not need to go to any nursing facility, I can get therapy at home.  I can do better at home by myself.  There is no reason why I have to go to a nursing facility.  It is my right to go home my home."  The patient did present with impairments in memory, attention span, or intelligence.   Appreciation: The patient was able to identify and describe her various illnesses and treatment options with potential outcomes. The patient did not present with concerns such as denial or delusional thought-process. " I am confused as to why I am talking to you since I dont have anything wrong. They once mentioned P S something that I do not have. There is nothing wrong with me. "    Rationalization: The patient was unable to weigh risks and benefits and come to a conclusion congruent with patient's perceived goals. Concerns regarding this category are: depression.  In conclusion, the patient is-not experiencing an acute medical scenario: Recent refusal for skilled nursing services which could compromise her mental capacity.    Conclusion: At this time, there is sufficient evidence to warrant removal of the patient's rights for medical decision-making.  He can clearly determine her rights and willingness to return home where she wishes to spend her last days.  At this time, we can determine that patient does not have  functional mental capacity for medical decision making including the right to accept or refuse skilled nursing placement.   A long discussion was had with patient regarding her medical complaints and her prognosis if she chooses not to return to a skilled nursing facility which includes a fall that could cause a head injury and or death.  Patient expresses an understanding of both her medical condition and the  proposed plan, and continues to remain adamant about going home.  Patient has an appreciation of the fact that she may be harmed if she falls again.  Patient is unable to reason with writer and explain why she has made her decision the way she has made it.    -Patient does not have capacity to participate in medical decision making as it pertains to refusal of skilled nursing facility placement. -Also recommend considering palliative care consult, as patient understands her risk for returning home, and has expressed to spend her last days in her home.  Palliative care consult to establish goals of care.   -Recommend a substitute decision maker must be sought to authorize medical intervention or to refuse treatment on behalf of the patient.   In the absence of an advance directive and when time is available, the recommendation is usually to contact family members (the priority order to be approached is the spouse, adult children, parents, siblings, and other relatives).

## 2020-10-06 LAB — BASIC METABOLIC PANEL
Anion gap: 5 (ref 5–15)
BUN: 29 mg/dL — ABNORMAL HIGH (ref 8–23)
CO2: 25 mmol/L (ref 22–32)
Calcium: 9.4 mg/dL (ref 8.9–10.3)
Chloride: 109 mmol/L (ref 98–111)
Creatinine, Ser: 0.81 mg/dL (ref 0.44–1.00)
GFR, Estimated: >60 mL/min (ref >60)
Glucose, Bld: 172 mg/dL — ABNORMAL HIGH (ref 70–99)
Potassium: 4.1 mmol/L (ref 3.5–5.1)
Sodium: 139 mmol/L (ref 135–145)

## 2020-10-06 LAB — GLUCOSE, CAPILLARY
Glucose-Capillary: 128 mg/dL — ABNORMAL HIGH (ref 70–99)
Glucose-Capillary: 136 mg/dL — ABNORMAL HIGH (ref 70–99)
Glucose-Capillary: 126 mg/dL — ABNORMAL HIGH (ref 70–99)
Glucose-Capillary: 121 mg/dL — ABNORMAL HIGH (ref 70–99)

## 2020-10-06 LAB — CBC WITH DIFFERENTIAL/PLATELET
Abs Immature Granulocytes: 0.03 10*3/uL (ref 0.00–0.07)
Basophils Absolute: 0 10*3/uL (ref 0.0–0.1)
Basophils Relative: 1 %
Eosinophils Absolute: 0.3 10*3/uL (ref 0.0–0.5)
Eosinophils Relative: 3 %
HCT: 37.9 % (ref 36.0–46.0)
Hemoglobin: 11.8 g/dL — ABNORMAL LOW (ref 12.0–15.0)
Immature Granulocytes: 0 %
Lymphocytes Relative: 22 %
Lymphs Abs: 1.6 10*3/uL (ref 0.7–4.0)
MCH: 29.7 pg (ref 26.0–34.0)
MCHC: 31.1 g/dL (ref 30.0–36.0)
MCV: 95.5 fL (ref 80.0–100.0)
Monocytes Absolute: 0.6 10*3/uL (ref 0.1–1.0)
Monocytes Relative: 8 %
Neutro Abs: 4.8 10*3/uL (ref 1.7–7.7)
Neutrophils Relative %: 66 %
Platelets: 202 10*3/uL (ref 150–400)
RBC: 3.97 MIL/uL (ref 3.87–5.11)
RDW: 13.2 % (ref 11.5–15.5)
WBC: 7.3 10*3/uL (ref 4.0–10.5)
nRBC: 0 % (ref 0.0–0.2)

## 2020-10-06 LAB — PHOSPHORUS: Phosphorus: 2.9 mg/dL (ref 2.5–4.6)

## 2020-10-06 LAB — MAGNESIUM: Magnesium: 2 mg/dL (ref 1.7–2.4)

## 2020-10-06 NOTE — Progress Notes (Signed)
Physical Therapy Treatment Patient Details Name: Kristina Wilkinson MRN: [id number] DOB: 1927-03-07 Today's Date: 10/06/2020    History of Present Illness Pt admitted from home 2* multiple falls, weakness and c/o L knee, L shoulder and hip pain - on imaging no acute findings.  Pt with hx of DM, LBP, L TKR, cerebral aneurysm repair, peripheral neuropathy, and multiple falls -"I call the fire department to get me up"    PT Comments    Attempted to see pt twice as she declined each time.  Pt was already OOB so she agreed to walk after lunch as long as I assisted her back to bed. General transfer comment: "I can't get up" stated pt as she struggled to rise from recliner.  Required MOD assist to stand then was present with posterior LOB and poor self correction reaction.  Very unsteady esp with turning to bed as pt reached beyond her BOS for the bed before completing her turn with hips.  VERY unsteady.  HIGH FALL RISK. General Gait Details: limited distance from recliner to bed as pt declined to go any further.  VERY unsteady gait with poor forward flex posture.  Short shuffled steps and poor balance esp with turning.  Pt stated,"I do better in my home". General bed mobility comments: with increased time pt self able B LE up onto bed but did need assist to scoot to [facility] Pt lives home alone and has multiple falls.  Pt will need ST Rehab at SNF prior to safely retuning home.   Follow Up Recommendations  SNF     Equipment Recommendations  None recommended by PT    Recommendations for Other Services       Precautions / Restrictions Precautions Precautions: Fall Precaution Comments: multiple falls at home, [facility]    Mobility  Bed Mobility Overal bed mobility: Needs Assistance Bed Mobility: Sit to Supine       Sit to supine: Min assist   General bed mobility comments: with increased time pt self able B LE up onto bed but did need assist to scoot to [facility]    Transfers Overall transfer level:  Needs assistance Equipment used: Rolling walker (2 wheeled) Transfers: Sit to/from [facility] Sit to Stand: Mod assist Stand pivot transfers: Mod assist       General transfer comment: "I can't get up" stated pt as she struggled to rise from recliner.  Required MOD assist to stand then was present with posterior LOB and poor self correction reaction.  Very unsteady esp with turning to bed as pt reached beyond her BOS for the bed before completing her turn with hips.  VERY unsteady.  HIGH FALL RISK.  Ambulation/Gait Ambulation/Gait assistance: Min assist Gait Distance (Feet): 5 Feet Assistive device: Rolling walker (2 wheeled) Gait Pattern/deviations: Shuffle;Trunk flexed;Decreased stride length Gait velocity: decreased   General Gait Details: limited distance from recliner to bed as pt declined to go any further.  VERY unsteady gait with poor forward flex posture.  Short shuffled steps and poor balance esp with turning.  Pt stated,"I do better in my home".   Stairs             Wheelchair Mobility    Modified Rankin (Stroke Patients Only)       Balance  Cognition Arousal/Alertness: Awake/alert   Overall Cognitive Status: Within Functional Limits for tasks assessed                                 General Comments: AxO x 3 and VERY HOH.      Exercises      General Comments        Pertinent Vitals/Pain Pain Assessment: No/denies pain    Home Living                      Prior Function            PT Goals (current goals can now be found in the care plan section) Progress towards PT goals: Progressing toward goals    Frequency    Min 2X/week      PT Plan Current plan remains appropriate    Co-evaluation              AM-PAC PT "6 Clicks" Mobility   Outcome Measure  Help needed turning from your back to your side while in a flat bed without  using bedrails?: A Little Help needed moving from lying on your back to sitting on the side of a flat bed without using bedrails?: A Little Help needed moving to and from a bed to a chair (including a wheelchair)?: A Lot Help needed standing up from a chair using your arms (e.g., wheelchair or bedside chair)?: A Lot Help needed to walk in hospital room?: A Lot Help needed climbing 3-5 steps with a railing? : Total 6 Click Score: 13    End of Session Equipment Utilized During Treatment: Gait belt Activity Tolerance: Patient tolerated treatment well Patient left: in bed;with bed alarm set;with call bell/phone within reach Nurse Communication: Mobility status PT Visit Diagnosis: Unsteadiness on feet (R26.81);Muscle weakness (generalized) (M62.81);History of falling (Z91.81)     Time: [id number] PT Time Calculation (min) (ACUTE ONLY): 13 min  Charges:  $Gait Training: 8-22 mins                     {Benyamin Jeff  PTA Acute  Rehabilitation [employer]      [phone] Office      [phone]

## 2020-10-06 NOTE — TOC Progression Note (Signed)
Transition of Care [facility]) - Progression Note    Patient Details  Name: Kristina Wilkinson MRN: [id number] Date of Birth: [birth year]  Transition of Care [facility]) CM/SW Contact  Ross Ludwig, [place] Phone Number: 10/06/2020, 3:36 PM  Clinical Narrative:     CSW was informed that patient has chosen [facility] for short term rehab.  CSW contacted [facility], and they do not have a bed available until maybe next week.  CSW awaiting bed availability.   Expected Discharge Plan: [place] Barriers to Discharge: Continued Medical Work up  Expected Discharge Plan and Services Expected Discharge Plan: [place] arrangements for the past 2 months: Single Family Home                                       Social Determinants of Health (SDOH) Interventions    Readmission Risk Interventions No flowsheet data found.

## 2020-10-06 NOTE — Progress Notes (Signed)
Occupational Therapy Treatment Patient Details Name: Kristina Wilkinson MRN: [id number] DOB: 01/13/1928 Today's Date: 10/06/2020    History of present illness Pt admitted from home 2* multiple falls, weakness and c/o L knee, L shoulder and hip pain - on imaging no acute findings.  Pt with hx of DM, LBP, L TKR, cerebral aneurysm repair, peripheral neuropathy, and multiple falls -"I call the fire department to get me up"   OT comments  Patient remains high fall risk. Needing mod A with cues for hand placement to power up to standing from edge of bed. Takes two steps towards chair and has posterior loss of balance with no initiation to let go of walker and reach back for bed. Patient needing mod A to safely pivot with walker to recliner chair due to posterior lean. Unable to attempt grooming/hygiene at sink due to safety therefore performed at set up level in recliner chair. Continue to recommend rehab as patient is high fall risk and needing increased assist with basic transfers and self care.    Follow Up Recommendations  SNF    Equipment Recommendations  None recommended by OT          Precautions / Restrictions Precautions Precautions: Fall Precaution Comments: multiple falls at home, [facility] Restrictions Weight Bearing Restrictions: No       Mobility Bed Mobility Overal bed mobility: Needs Assistance Bed Mobility: Supine to Sit     Supine to sit: Min guard     General bed mobility comments: min G for safety    Transfers Overall transfer level: Needs assistance Equipment used: Rolling walker (2 wheeled) Transfers: Sit to/from [facility] Sit to Stand: Mod assist Stand pivot transfers: Mod assist       General transfer comment: please see toilet transfer in ADL section    Balance Overall balance assessment: Needs assistance;History of Falls Sitting-balance support: Feet supported Sitting balance-Leahy Scale: Fair   Postural control: Posterior  lean Standing balance support: Bilateral upper extremity supported Standing balance-Leahy Scale: Poor Standing balance comment: needing mod A and reliant on walker                           ADL either performed or assessed with clinical judgement   ADL Overall ADL's : Needs assistance/impaired     Grooming: Oral care;Wash/dry face;Wash/dry hands;Set up;Sitting Grooming Details (indicate cue type and reason): unable to perform in standing due to high fall risk, poor standing balance                 Toilet Transfer: Moderate assistance;Stand-pivot;Cueing for sequencing;Cueing for safety;RW Toilet Transfer Details (indicate cue type and reason): needing light mod A to power up to standing from edge of bed and cues for hand placement. Initially able to stand however after taking 2 steps towards chair has posterior loss of balance back onto bed and sits quickly, does not reach back. Needing mod A to stand again and to safely complete pivot to chair with walker         Functional mobility during ADLs: Moderate assistance;Cueing for safety;Cueing for sequencing;Rolling walker General ADL Comments: high fall risk      Cognition Arousal/Alertness: Awake/alert Behavior During Therapy: WFL for tasks assessed/performed Overall Cognitive Status: Within Functional Limits for tasks assessed  Pertinent Vitals/ Pain       Pain Assessment: Faces Faces Pain Scale: No hurt         Frequency  Min 2X/week        Progress Toward Goals  OT Goals(current goals can now be found in the care plan section)  Progress towards OT goals: Progressing toward goals  Acute Rehab OT Goals Patient Stated Goal: to get back home OT Goal Formulation: With patient Time For Goal Achievement: 10/11/20 Potential to Achieve Goals: Good  Plan Discharge plan remains appropriate       AM-PAC OT "6 Clicks" Daily  Activity     Outcome Measure   Help from another person eating meals?: None Help from another person taking care of personal grooming?: A Little Help from another person toileting, which includes using toliet, bedpan, or urinal?: A Lot Help from another person bathing (including washing, rinsing, drying)?: A Lot Help from another person to put on and taking off regular upper body clothing?: A Little Help from another person to put on and taking off regular lower body clothing?: A Lot 6 Click Score: 16    End of Session Equipment Utilized During Treatment: Rolling walker;Gait belt  OT Visit Diagnosis: Muscle weakness (generalized) (M62.81);History of falling (Z91.81)   Activity Tolerance Patient tolerated treatment well   Patient Left in chair;with call bell/phone within reach;with chair alarm set;with nursing/sitter in room   Nurse Communication Mobility status        Time: [id number] OT Time Calculation (min): 20 min  Charges: OT General Charges $OT Visit: 1 Visit OT Treatments $Self Care/Home Management : 8-22 mins  Delbert Phenix OT OT pager: [place] 10/06/2020, 9:58 AM

## 2020-10-06 NOTE — Progress Notes (Signed)
PROGRESS NOTE    Kristina Wilkinson  [id number] DOB: February 28, 1927 DOA: 09/25/2020 PCP: Pcp, No   Brief Narrative:  The patient is a 87 year old elderly female with a past medical history significant for but not limited to diabetes mellitus type 2, hypertension, debility, recurrent falls, history of CNS aneurysm status post clipping, history of DVT back in May 2021 status post Eliquis for 3 months and other comorbidities.  Patient lives alone and she presented to the ED from home with a chief complaint of progressive generalized weakness leading to a fall in both her knees there is bruising and left-sided rib pain.  In the ED she was noted to be in A. fib with RVR she was started on a Cardizem drip and admitted to the hospitalist service.  Subsequently after being placed on a Cardizem drip she converted to sinus rhythm and subsequently changed to oral metoprolol.  Her CHA2DS2-VASc score is at least 6 and she is now on long-term anticoagulation with Eliquis.  Her blood pressure was elevated and is now fairly well controlled with metoprolol, lisinopril and amlodipine.  Urinalysis showed large amount leukocytes plus nitrates and she was treated with dose of fosfomycin. Given her recurrent falls and generalized weakness PT OT recommended SNF but she is very adamant in going home.  She does not have the medical capacity decision-making to refuse SNF at this time and will need SNF placement and she wants to go to [facility] in [place]:   Principal Problem:   New onset a-fib [facility]) Active Problems:   HTN (hypertension)   Diabetes mellitus without complication ([place])   Abnormal urinalysis   Leukocytosis   Falls  New onset A. fib with RVR -Initially placed on Cardizem drip.  After conversion to sinus rhythm, patient was switched to oral metoprolol.   -Currently stable on Toprol at 12.5 mg daily.  Continue to monitor in telemetry. -CHADSVASC score at least 6 putting her at high risk  of stroke.  Per history, she tolerated a 59-monthcourse of Eliquis in May 2021 for DVT.   -Patient agreed to long-term anticoagulation at this time because of A. fib.  Eliquis was started.   Hypertensive urgency -Blood pressure fairly controlled for her age.   -Continue Toprol 12.5 mg daily, Lisinopril 40 mg daily and Amlodipine 10 mg daily.   -Continue to monitor BP per Protocol -Last BP was 135/70   Diabetes Mellitus Type 2 -Well-controlled, A1c 6.1.  Continue SSI. -CBG's ranging from 91-157  Normocytic Anemia -Patient's Hgb/Hct went from 9.6/30.3 -> 11.4/35.7 -> 11.8/37.9 -Check Anemia Panel as an outpatient  -Continue to Monitor for S/Sx of Bleeding; No overt bleeding noted -Repeat CBC in the AM    UTI -Urinalysis showed large amount of leukocytes, positive nitrates and many bacteria.   -Treated with a dose of Fosfomycin.   Generalized Weakness/Recurrent Falls -PT OT recommends SNF.  Patient very adamant on going home.  She does not even want home health.  Patient 87year old and lives alone.  [place] including recent stroke and has dysarthria and cannot take care of himself.  Dr. PDoristine Bosworthspent long time trying to convince her and counselor however she is very adamant on her decision.  Eventually Dr. PDoristine Bosworthasked her if he could talk to her family member and she granted permission to talk to her son [name]  I spoke to him however he also had stroke recently and  he has significant dysarthria and he is unable to care for himself and he referred me to talk to patient's best friend, who is also her nephew Kristina Wilkinson, however he mentioned that we should do what is best for the patient.  Dr. Doristine Bosworth spoke to [facility] and he informed me that for the last 2 to 3 weeks, patient was even unable to get up and she kept calling fire department almost on daily basis.  Dr. Doristine Bosworth informed him about recommendations for SNF.  He agreed with  that.  He also was on the same page that patient should not go home as she is not doing well.  Dr. Doristine Bosworth requested him to come talk to the patient and convince her to go to rehab.  So he did and patient was agreeable to go to SNF on 'Sunday but then once again she reiterated that she only wants to go home.  She does not have has mental capacity to make the right decision for herself and this was confirmed by Psych Consultation -PT and OT constantly are recommending SNF for [facility].  TOC working on finding a bed and patient now requesting [name]  -Palliative Care consulted for GOC Discussion    DVT prophylaxis: Anticoagulated with Apixaban 5 mg po BID  Code Status: FULL CODE Family Communication: No family present at bedside Disposition Plan:   Status is: Inpatient  Remains inpatient appropriate because:Unsafe d/c plan, IV treatments appropriate due to intensity of illness or inability to take PO, and Inpatient level of care appropriate due to severity of illness  Dispo: The patient is from: Home              Anticipated d/c is to: SNF              Patient currently is medically stable to d/c.   Difficult to place patient No  Consultants:  Psychiatry Palliative Care    Procedures: None  Antimicrobials:  Anti-infectives (From admission, onward)    Start     Dose/Rate Route Frequency Ordered Stop   09/26/20 1000  nitrofurantoin (macrocrystal-monohydrate) (MACROBID) capsule 100 mg  Status:  Discontinued        10'$ 0 mg Oral Every 12 hours 09/26/20 0734 09/26/20 0735   09/26/20 0800  fosfomycin (MONUROL) packet 3 g        3 g Oral  Once 09/26/20 0735 09/26/20 0936        Subjective: Seen and examined at bedside and she is now agreeable to going to SNF and wants to go to [facility] in [place].  She denied any complaints at this time.  No chest pain or shortness of breath.  Feels well.  No other concerns or complaints at this time.  Objective: Vitals:   10/06/20  0500 10/06/20 0548 10/06/20 0833 10/06/20 1202  BP:  (!) 164/76 (!) 141/56 135/70  Pulse:  62 73 61  Resp:    17  Temp:  98.3 F (36.8 C)  97.6 F (36.4 C)  TempSrc:  Oral  Oral  SpO2:  97%  98%  Weight: 63.5 kg     Height:        Intake/Output Summary (Last 24 hours) at 10/06/2020 1353 Last data filed at 10/06/2020 0700 Gross per 24 hour  Intake --  Output 1300 ml  Net -1300 ml    Filed Weights   10/04/20 0443 10/05/20 0438 10/06/20 0500  Weight: 65.7 kg 65.4 kg 63.5 kg   Examination: Physical Exam:  Constitutional: WN/WD weight elderly Caucasian female currently in no acute distress Eyes: Lids and conjunctivae normal, sclerae anicteric  ENMT: External Ears, Nose appear normal. Grossly normal hearing. Neck: Appears normal, supple, no cervical masses, normal ROM, no appreciable thyromegaly; no JVD Respiratory: Diminished to auscultation bilaterally, no wheezing, rales, rhonchi or crackles. Normal respiratory effort and patient is not tachypenic. No accessory muscle use.  Unlabored breathing Cardiovascular: RRR, no murmurs / rubs / gallops. S1 and S2 auscultated. No extremity edema. Abdomen: Soft, non-tender, distended secondary body habitus. No masses palpated. No appreciable hepatosplenomegaly. Bowel sounds positive.  GU: Deferred. Musculoskeletal: No clubbing / cyanosis of digits/nails. Normal strength and muscle tone.  Skin: No rashes, lesions, ulcers on limited skin evaluation. No induration; Warm and dry.  Neurologic: CN 2-12 grossly intact with no focal deficits. Romberg sign and cerebellar reflexes not assessed.  Psychiatric: Normal judgment and insight. Alert and oriented x 3. Normal mood and appropriate affect.   Data Reviewed: I have personally reviewed following labs and imaging studies  CBC: Recent Labs  Lab 09/30/20 0511 10/06/20 0906  WBC 6.8 7.3  NEUTROABS 4.1 4.8  HGB 11.4* 11.8*  HCT 35.7* 37.9  MCV 93.7 95.5  PLT 163 [id number]    Basic Metabolic  Panel: Recent Labs  Lab 09/30/20 0511 10/06/20 0906  NA 141 139  K 3.6 4.1  CL 112* 109  CO2 24 25  GLUCOSE 100* 172*  BUN 19 29*  CREATININE 0.72 0.81  CALCIUM 9.3 9.4  MG  --  2.0  PHOS  --  2.9    GFR: Estimated Creatinine Clearance: 40.6 mL/min (by C-G formula based on SCr of 0.81 mg/dL). Liver Function Tests: No results for input(s): AST, ALT, ALKPHOS, BILITOT, PROT, ALBUMIN in the last 168 hours. No results for input(s): LIPASE, AMYLASE in the last 168 hours. No results for input(s): AMMONIA in the last 168 hours. Coagulation Profile: No results for input(s): INR, PROTIME in the last 168 hours. Cardiac Enzymes: No results for input(s): CKTOTAL, CKMB, CKMBINDEX, TROPONINI in the last 168 hours. BNP (last 3 results) No results for input(s): PROBNP in the last 8760 hours. HbA1C: No results for input(s): HGBA1C in the last 72 hours. CBG: Recent Labs  Lab 10/05/20 1145 10/05/20 1617 10/05/20 2155 10/06/20 0729 10/06/20 1159  GLUCAP 157* 91 148* 128* 136*    Lipid Profile: No results for input(s): CHOL, HDL, LDLCALC, TRIG, CHOLHDL, LDLDIRECT in the last 72 hours. Thyroid Function Tests: No results for input(s): TSH, T4TOTAL, FREET4, T3FREE, THYROIDAB in the last 72 hours. Anemia Panel: No results for input(s): VITAMINB12, FOLATE, FERRITIN, TIBC, IRON, RETICCTPCT in the last 72 hours. Sepsis Labs: No results for input(s): PROCALCITON, LATICACIDVEN in the last 168 hours.  No results found for this or any previous visit (from the past 240 hour(s)).   RN Pressure Injury Documentation: Pressure Injury 03/07/19 Head Posterior Stage 2 -  Partial thickness loss of dermis presenting as a shallow open injury with a red, pink wound bed without slough. (Active)  03/07/19 1000  Location: Head  Location Orientation: Posterior  Staging: Stage 2 -  Partial thickness loss of dermis presenting as a shallow open injury with a red, pink wound bed without slough.  Wound  Description (Comments):   Present on Admission: Yes   Estimated body mass index is 24.8 kg/m as calculated from the following:   Height as of this encounter: '5\' 3"'$  (1.6 m).   Weight as of this encounter: 63.5 kg.  Malnutrition Type:  Malnutrition Characteristics:   Nutrition Interventions:    Radiology Studies: No results found.  Scheduled Meds:  amLODipine  10 mg Oral Daily   apixaban  5 mg Oral BID   insulin aspart  0-9 Units Subcutaneous TID WC   lisinopril  40 mg Oral Daily   metoprolol succinate  12.5 mg Oral Daily   Continuous Infusions:   LOS: 10 days   Kerney Elbe, DO [facility] PAGER is on AMION  If 7PM-7AM, please contact night-coverage [email or web address]

## 2020-10-07 LAB — GLUCOSE, CAPILLARY
Glucose-Capillary: 97 mg/dL (ref 70–99)
Glucose-Capillary: 136 mg/dL — ABNORMAL HIGH (ref 70–99)
Glucose-Capillary: 111 mg/dL — ABNORMAL HIGH (ref 70–99)

## 2020-10-07 NOTE — Progress Notes (Signed)
PROGRESS NOTE    Kristina Wilkinson  [id number] DOB: 1927/11/07 DOA: 09/25/2020 PCP: Pcp, No   Brief Narrative:  The patient is a 87 year old elderly female with a past medical history significant for but not limited to diabetes mellitus type 2, hypertension, debility, recurrent falls, history of CNS aneurysm status post clipping, history of DVT back in May 2021 status post Eliquis for 3 months and other comorbidities.  Patient lives alone and she presented to the ED from home with a chief complaint of progressive generalized weakness leading to a fall in both her knees there is bruising and left-sided rib pain.  In the ED she was noted to be in A. fib with RVR she was started on a Cardizem drip and admitted to the hospitalist service.  Subsequently after being placed on a Cardizem drip she converted to sinus rhythm and subsequently changed to oral metoprolol.  Her CHA2DS2-VASc score is at least 6 and she is now on long-term anticoagulation with Eliquis.  Her blood pressure was elevated and is now fairly well controlled with metoprolol, lisinopril and amlodipine.  Urinalysis showed large amount leukocytes plus nitrates and she was treated with dose of fosfomycin. Given her recurrent falls and generalized weakness PT OT recommended SNF but she is very adamant in going home.  She does not have the medical capacity decision-making to refuse SNF at this time and will need SNF placement and she wants to go to [facility] in [place].  She is medically stable to be discharged at this time however does not have a current bed available and likely will have on Monday  Assessment & Plan:   Principal Problem:   New onset a-fib [facility]) Active Problems:   HTN (hypertension)   Diabetes mellitus without complication (HCC)   Abnormal urinalysis   Leukocytosis   Falls  New onset A. fib with RVR -Initially placed on Cardizem drip.  After conversion to sinus rhythm, patient was switched to oral metoprolol.    -Currently stable on Toprol at 12.5 mg daily.  Continue to monitor in telemetry. -CHADSVASC score at least 6 putting her at high risk of stroke.  Per history, she tolerated a 69-monthcourse of Eliquis in May 2021 for DVT.   -Patient agreed to long-term anticoagulation at this time because of A. fib.  Eliquis was started.   Hypertensive urgency -Blood pressure fairly controlled for her age.   -Continue Toprol 12.5 mg daily, Lisinopril 40 mg daily and Amlodipine 10 mg daily.   -Continue to monitor BP per Protocol -Last BP was 140/63   Diabetes Mellitus Type 2 -Well-controlled, A1c 6.1.  Continue SSI. -CBG's ranging from 97-148  Normocytic Anemia -Patient's Hgb/Hct went from 9.6/30.3 -> 11.4/35.7 -> 11.8/37.9 on last check  -Check Anemia Panel as an outpatient  -Continue to Monitor for S/Sx of Bleeding; No overt bleeding noted -Repeat CBC in the AM    UTI -Urinalysis showed large amount of leukocytes, positive nitrates and many bacteria.   -Treated with a dose of Fosfomycin.   Generalized Weakness/Recurrent Falls -PT OT recommends SNF.  Patient very adamant on going home.  She does not even want home health.  Patient 87year old and lives alone.  [place] including recent stroke and has dysarthria and cannot take care of himself.  Dr. PDoristine Bosworthspent long time trying to convince her and counselor however she is very adamant on her decision.  Eventually Dr. PDoristine Bosworthasked her if  he could talk to her family member and she granted permission to talk to her son Kristina Wilkinson.  I spoke to him however he also had stroke recently and he has significant dysarthria and he is unable to care for himself and he referred me to talk to patient's best friend, who is also her nephew Kristina Wilkinson, however he mentioned that we should do what is best for the patient.  Dr. 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  Dr. Doristine Bosworth informed him about recommendations for SNF.  He agreed with that.  He also was on the same page that patient should not go home as she is not doing well.  Dr. Doristine Bosworth requested him to come talk to the patient and convince her to go to rehab.  So he did and patient was agreeable to go to SNF on 'Sunday but then once again she reiterated that she only wants to go home.  She does not have has mental capacity to make the right decision for herself and this was confirmed by Psych Consultation -PT and OT constantly are recommending SNF for [facility].  TOC working on finding a bed and patient now requesting [name]  -Palliative Care consulted for GOC Discussion    DVT prophylaxis: Anticoagulated with Apixaban 5 mg po BID  Code Status: FULL CODE Family Communication: No family present at bedside Disposition Plan: SNF when bed is available   Status is: Inpatient  Remains inpatient appropriate because:Unsafe d/c plan, IV treatments appropriate due to intensity of illness or inability to take PO, and Inpatient level of care appropriate due to severity of illness  Dispo: The patient is from: Home              Anticipated d/c is to: SNF              Patient currently is medically stable to d/c.   Difficult to place patient No  Consultants:  Psychiatry Palliative Care    Procedures: None  Antimicrobials:  Anti-infectives (From admission, onward)    Start     Dose/Rate Route Frequency Ordered Stop   09/26/20 1000  nitrofurantoin (macrocrystal-monohydrate) (MACROBID) capsule 100 mg  Status:  Discontinued        10'$ 0 mg Oral Every 12 hours 09/26/20 0734 09/26/20 0735   09/26/20 0800  fosfomycin (MONUROL) packet 3 g        3 g Oral  Once 09/26/20 0735 09/26/20 0936        Subjective: Seen and examined at bedside and she was sleeping and had no acute issues.  She denied any chest pain or shortness of  breath and had no pain.  Felt well.  No other concerns or complaints at this time.  Objective: Vitals:   10/06/20 1202 10/06/20 2224 10/07/20 0422 10/07/20 0804  BP: 135/70 (!) 125/57  140/63  Pulse: 61 63  62  Resp: '17 18  16  '$ Temp: 97.6 F (36.4 C) 97.8 F (36.6 C)  97.6 F (36.4 C)  TempSrc: Oral Oral  Oral  SpO2: 98% 98%  99%  Weight:   65.7 kg   Height:        Intake/Output Summary (Last 24 hours) at 10/07/2020 1135 Last data filed at 10/07/2020 0947 Gross per 24 hour  Intake 720 ml  Output 650 ml  Net 70 ml  Filed Weights   10/05/20 0438 10/06/20 0500 10/07/20 0422  Weight: 65.4 kg 63.5 kg 65.7 kg   Examination: Physical Exam:  Constitutional: Well-nourished, well-developed elderly overweight Caucasian female in no acute distress Respiratory: Diminished to auscultation bilaterally no appreciable wheezing, rales, rhonchi.  Unlabored breathing and has a normal respiratory effort and not wearing supplemental oxygen via nasal cannula Cardiovascular: Has a normal sinus rhythm and no appreciable extremity edema Abdomen: Soft, nontender, distended secondary body habitus Neurologic: Cranial nerves II through XII gross intact no appreciable focal deficits  Data Reviewed: I have personally reviewed following labs and imaging studies  CBC: Recent Labs  Lab 10/06/20 0906  WBC 7.3  NEUTROABS 4.8  HGB 11.8*  HCT 37.9  MCV 95.5  PLT 123XX123    Basic Metabolic Panel: Recent Labs  Lab 10/06/20 0906  NA 139  K 4.1  CL 109  CO2 25  GLUCOSE 172*  BUN 29*  CREATININE 0.81  CALCIUM 9.4  MG 2.0  PHOS 2.9    GFR: Estimated Creatinine Clearance: 41.2 mL/min (by C-G formula based on SCr of 0.81 mg/dL). Liver Function Tests: No results for input(s): AST, ALT, ALKPHOS, BILITOT, PROT, ALBUMIN in the last 168 hours. No results for input(s): LIPASE, AMYLASE in the last 168 hours. No results for input(s): AMMONIA in the last 168 hours. Coagulation Profile: No results for  input(s): INR, PROTIME in the last 168 hours. Cardiac Enzymes: No results for input(s): CKTOTAL, CKMB, CKMBINDEX, TROPONINI in the last 168 hours. BNP (last 3 results) No results for input(s): PROBNP in the last 8760 hours. HbA1C: No results for input(s): HGBA1C in the last 72 hours. CBG: Recent Labs  Lab 10/06/20 0729 10/06/20 1159 10/06/20 1658 10/06/20 2220 10/07/20 0803  GLUCAP 128* 136* 126* 121* 97    Lipid Profile: No results for input(s): CHOL, HDL, LDLCALC, TRIG, CHOLHDL, LDLDIRECT in the last 72 hours. Thyroid Function Tests: No results for input(s): TSH, T4TOTAL, FREET4, T3FREE, THYROIDAB in the last 72 hours. Anemia Panel: No results for input(s): VITAMINB12, FOLATE, FERRITIN, TIBC, IRON, RETICCTPCT in the last 72 hours. Sepsis Labs: No results for input(s): PROCALCITON, LATICACIDVEN in the last 168 hours.  No results found for this or any previous visit (from the past 240 hour(s)).   RN Pressure Injury Documentation: Pressure Injury 03/07/19 Head Posterior Stage 2 -  Partial thickness loss of dermis presenting as a shallow open injury with a red, pink wound bed without slough. (Active)  03/07/19 1000  Location: Head  Location Orientation: Posterior  Staging: Stage 2 -  Partial thickness loss of dermis presenting as a shallow open injury with a red, pink wound bed without slough.  Wound Description (Comments):   Present on Admission: Yes   Estimated body mass index is 25.66 kg/m as calculated from the following:   Height as of this encounter: '5\' 3"'$  (1.6 m).   Weight as of this encounter: 65.7 kg.  Malnutrition Type:   Malnutrition Characteristics:   Nutrition Interventions:    Radiology Studies: No results found.  Scheduled Meds:  amLODipine  10 mg Oral Daily   apixaban  5 mg Oral BID   insulin aspart  0-9 Units Subcutaneous TID WC   lisinopril  40 mg Oral Daily   metoprolol succinate  12.5 mg Oral Daily   Continuous Infusions:   LOS: 11 days    Kerney Elbe, DO [facility] PAGER is on AMION  If 7PM-7AM, please contact night-coverage [email or web address]

## 2020-10-07 NOTE — Care Management Important Message (Signed)
Medicare IM printed for [facility] to give to the patient. 

## 2020-10-07 NOTE — Progress Notes (Signed)
I received a referral to provide support to pt.  She was appreciative of the visit even though she had a difficult time hearing.  She stated that she is doing okay for now.  Chaplain Janne Napoleon, [place] Pager, [phone] 9:50 PM

## 2020-10-07 NOTE — TOC Progression Note (Signed)
Transition of Care [facility]) - Progression Note    Patient Details  Name: Kristina Wilkinson MRN: [id number] Date of Birth: [birth year]  Transition of Care [facility]) CM/SW Contact  Cecil Cobbs Phone Number: 10/07/2020, 4:24 PM  Clinical Narrative:     [place] SNF bed still not available for today, possibly on Monday.   Expected Discharge Plan: [place] Barriers to Discharge: Continued Medical Work up  Expected Discharge Plan and Services Expected Discharge Plan: [place] arrangements for the past 2 months: Single Family Home                                       Social Determinants of Health (SDOH) Interventions    Readmission Risk Interventions No flowsheet data found.

## 2020-10-08 LAB — GLUCOSE, CAPILLARY
Glucose-Capillary: 99 mg/dL (ref 70–99)
Glucose-Capillary: 154 mg/dL — ABNORMAL HIGH (ref 70–99)
Glucose-Capillary: 40 mg/dL — CL (ref 70–99)
Glucose-Capillary: 124 mg/dL — ABNORMAL HIGH (ref 70–99)

## 2020-10-08 NOTE — Progress Notes (Signed)
PROGRESS NOTE    Kristina Wilkinson  [id number] DOB: 1928-02-03 DOA: 09/25/2020 PCP: Pcp, No   Brief Narrative:  The patient is a 87 year old elderly female with a past medical history significant for but not limited to diabetes mellitus type 2, hypertension, debility, recurrent falls, history of CNS aneurysm status post clipping, history of DVT back in May 2021 status post Eliquis for 3 months and other comorbidities.  Patient lives alone and she presented to the ED from home with a chief complaint of progressive generalized weakness leading to a fall in both her knees there is bruising and left-sided rib pain.  In the ED she was noted to be in A. fib with RVR she was started on a Cardizem drip and admitted to the hospitalist service.  Subsequently after being placed on a Cardizem drip she converted to sinus rhythm and subsequently changed to oral metoprolol.  Her CHA2DS2-VASc score is at least 6 and she is now on long-term anticoagulation with Eliquis.  Her blood pressure was elevated and is now fairly well controlled with metoprolol, lisinopril and amlodipine.  Urinalysis showed large amount leukocytes plus nitrates and she was treated with dose of fosfomycin. Given her recurrent falls and generalized weakness PT OT recommended SNF but she is very adamant in going home.  She does not have the medical capacity decision-making to refuse SNF at this time and will need SNF placement and she wants to go to [facility] in [place].  She is medically stable to be discharged at this time however does not have a current bed available and likely will have on Monday  Assessment & Plan:   Principal Problem:   New onset a-fib [facility]) Active Problems:   HTN (hypertension)   Diabetes mellitus without complication (HCC)   Abnormal urinalysis   Leukocytosis   Falls  New onset A. fib with RVR -Initially placed on Cardizem drip.  After conversion to sinus rhythm, patient was switched to oral metoprolol.    -Currently stable on Toprol at 12.5 mg daily.  Continue to monitor in telemetry. -CHADSVASC score at least 6 putting her at high risk of stroke.  Per history, she tolerated a 58-monthcourse of Eliquis in May 2021 for DVT.   -Patient agreed to long-term anticoagulation at this time because of A. fib.  Eliquis was started.   Hypertensive urgency -Blood pressure fairly controlled for her age.   -Continue Toprol 12.5 mg daily, Lisinopril 40 mg daily and Amlodipine 10 mg daily.   -Continue to monitor BP per Protocol -Last BP was 156/56   Diabetes Mellitus Type 2 -Well-controlled, A1c 6.1.  Continue SSI. -CBG's ranging from 97-136  Normocytic Anemia -Patient's Hgb/Hct went from 9.6/30.3 -> 11.4/35.7 -> 11.8/37.9 on last check  -Check Anemia Panel as an outpatient  -Continue to Monitor for S/Sx of Bleeding; No overt bleeding noted -Repeat CBC in the AM    UTI -Urinalysis showed large amount of leukocytes, positive nitrates and many bacteria.   -Treated with a dose of Fosfomycin.   Generalized Weakness/Recurrent Falls -PT OT recommends SNF.  Patient very adamant on going home.  She does not even want home health.  Patient 87year old and lives alone.  [place] many medical problems including recent stroke and has dysarthria and cannot take care of himself.  Dr. PDoristine Bosworthspent long time trying to convince her and counselor however she is very adamant on her decision.  Eventually Dr. PDoristine Bosworthasked her if  he could talk to her family member and she granted permission to talk to her son [name].  I spoke to him however he also had stroke recently and he has significant dysarthria and he is unable to care for himself and he referred me to talk to patient's best friend, who is also her nephew [name], however he mentioned that we should do what is best for the patient.  Dr. 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  Dr. Doristine Bosworth informed him about recommendations for SNF.  He agreed with that.  He also was on the same page that patient should not go home as she is not doing well.  Dr. Doristine Bosworth requested him to come talk to the patient and convince her to go to rehab.  So he did and patient was agreeable to go to SNF on 'Sunday but then once again she reiterated that she only wants to go home.  She does not have has mental capacity to make the right decision for herself and this was confirmed by Psych Consultation -PT and OT constantly are recommending SNF for [facility] Rehab.  TOC working on finding a bed and patient now requesting [name]  -Palliative Care consulted for GOC Discussion    DVT prophylaxis: Anticoagulated with Apixaban 5 mg po BID  Code Status: FULL CODE Family Communication: No family present at bedside Disposition Plan: SNF when bed is available   Status is: Inpatient  Remains inpatient appropriate because:Unsafe d/c plan, IV treatments appropriate due to intensity of illness or inability to take PO, and Inpatient level of care appropriate due to severity of illness  Dispo: The patient is from: Home              Anticipated d/c is to: SNF              Patient currently is medically stable to d/c.   Difficult to place patient No  Consultants:  Psychiatry Palliative Care    Procedures: None  Antimicrobials:  Anti-infectives (From admission, onward)    Start     Dose/Rate Route Frequency Ordered Stop   09/26/20 1000  nitrofurantoin (macrocrystal-monohydrate) (MACROBID) capsule 100 mg  Status:  Discontinued        10'$ 0 mg Oral Every 12 hours 09/26/20 0734 09/26/20 0735   09/26/20 0800  fosfomycin (MONUROL) packet 3 g        3 g Oral  Once 09/26/20 0735 09/26/20 0936        Subjective: Seen and examined at bedside and and had no complaints and was resting.  States that she was a little bit cold.  No  nausea or vomiting.  No chest pain or shortness of breath no other concerns or complaints at this time.  Objective: Vitals:   10/07/20 1359 10/07/20 2030 10/08/20 0413 10/08/20 0416  BP: (!) 125/42 (!) 144/55 (!) 156/56   Pulse: 66 66 (!) 57   Resp: '20 18 18   '$ Temp: 98.1 F (36.7 C) 98.4 F (36.9 C) 97.7 F (36.5 C)   TempSrc: Oral Oral Oral   SpO2: 96% 97% 99%   Weight:    67 kg  Height:        Intake/Output Summary (Last 24 hours) at 10/08/2020 0853 Last data filed at 10/08/2020 0420 Gross per 24 hour  Intake 240 ml  Output  1000 ml  Net -760 ml    Filed Weights   10/06/20 0500 10/07/20 0422 10/08/20 0416  Weight: 63.5 kg 65.7 kg 67 kg   Examination: Physical Exam:  Constitutional: Nourished, well developed overweight elderly Caucasian female currently in no acute distress Respiratory: Slightly diminished to auscultation bilaterally with no appreciable wheezing, rales or rhonchi.  Unlabored breathing Cardiovascular: Sinus rhythm with a regular rate at 76.  No appreciable lower extremity edema Abdomen: Soft, nontender, slightly distended second body habitus Neurologic: Cranial nerves II through XII grossly intact with no visual focal deficits but she is hard of hearing  Data Reviewed: I have personally reviewed following labs and imaging studies  CBC: Recent Labs  Lab 10/06/20 0906  WBC 7.3  NEUTROABS 4.8  HGB 11.8*  HCT 37.9  MCV 95.5  PLT 123XX123    Basic Metabolic Panel: Recent Labs  Lab 10/06/20 0906  NA 139  K 4.1  CL 109  CO2 25  GLUCOSE 172*  BUN 29*  CREATININE 0.81  CALCIUM 9.4  MG 2.0  PHOS 2.9    GFR: Estimated Creatinine Clearance: 41.6 mL/min (by C-G formula based on SCr of 0.81 mg/dL). Liver Function Tests: No results for input(s): AST, ALT, ALKPHOS, BILITOT, PROT, ALBUMIN in the last 168 hours. No results for input(s): LIPASE, AMYLASE in the last 168 hours. No results for input(s): AMMONIA in the last 168 hours. Coagulation  Profile: No results for input(s): INR, PROTIME in the last 168 hours. Cardiac Enzymes: No results for input(s): CKTOTAL, CKMB, CKMBINDEX, TROPONINI in the last 168 hours. BNP (last 3 results) No results for input(s): PROBNP in the last 8760 hours. HbA1C: No results for input(s): HGBA1C in the last 72 hours. CBG: Recent Labs  Lab 10/06/20 2220 10/07/20 0803 10/07/20 1205 10/07/20 1642 10/08/20 0727  GLUCAP 121* 97 136* 111* 99    Lipid Profile: No results for input(s): CHOL, HDL, LDLCALC, TRIG, CHOLHDL, LDLDIRECT in the last 72 hours. Thyroid Function Tests: No results for input(s): TSH, T4TOTAL, FREET4, T3FREE, THYROIDAB in the last 72 hours. Anemia Panel: No results for input(s): VITAMINB12, FOLATE, FERRITIN, TIBC, IRON, RETICCTPCT in the last 72 hours. Sepsis Labs: No results for input(s): PROCALCITON, LATICACIDVEN in the last 168 hours.  No results found for this or any previous visit (from the past 240 hour(s)).   RN Pressure Injury Documentation: Pressure Injury 03/07/19 Head Posterior Stage 2 -  Partial thickness loss of dermis presenting as a shallow open injury with a red, pink wound bed without slough. (Active)  03/07/19 1000  Location: Head  Location Orientation: Posterior  Staging: Stage 2 -  Partial thickness loss of dermis presenting as a shallow open injury with a red, pink wound bed without slough.  Wound Description (Comments):   Present on Admission: Yes   Estimated body mass index is 26.17 kg/m as calculated from the following:   Height as of this encounter: '5\' 3"'$  (1.6 m).   Weight as of this encounter: 67 kg.  Malnutrition Type:   Malnutrition Characteristics:   Nutrition Interventions:    Radiology Studies: No results found.  Scheduled Meds:  amLODipine  10 mg Oral Daily   apixaban  5 mg Oral BID   insulin aspart  0-9 Units Subcutaneous TID WC   lisinopril  40 mg Oral Daily   metoprolol succinate  12.5 mg Oral Daily   Continuous  Infusions:   LOS: 12 days   Kerney Elbe, DO [facility] PAGER is on [employer]  If 7PM-7AM, please contact night-coverage [email or web address]

## 2020-10-09 DIAGNOSIS — Z7189 Other specified counseling: Secondary | ICD-10-CM

## 2020-10-09 DIAGNOSIS — Z515 Encounter for palliative care: Secondary | ICD-10-CM

## 2020-10-09 DIAGNOSIS — K5900 Constipation, unspecified: Secondary | ICD-10-CM

## 2020-10-09 DIAGNOSIS — W19XXXA Unspecified fall, initial encounter: Secondary | ICD-10-CM

## 2020-10-09 LAB — GLUCOSE, CAPILLARY
Glucose-Capillary: 95 mg/dL (ref 70–99)
Glucose-Capillary: 152 mg/dL — ABNORMAL HIGH (ref 70–99)
Glucose-Capillary: 129 mg/dL — ABNORMAL HIGH (ref 70–99)
Glucose-Capillary: 128 mg/dL — ABNORMAL HIGH (ref 70–99)

## 2020-10-09 MED ORDER — POLYETHYLENE GLYCOL 3350 17 G PO PACK
17.0000 g | PACK | Freq: Two times a day (BID) | ORAL | Status: DC
  Administered 2020-10-09 – 2020-10-11 (×4): 17 g via ORAL
  Filled 2020-10-09 (×4): qty 1

## 2020-10-09 MED ORDER — BISACODYL 10 MG RE SUPP
10.0000 mg | Freq: Every day | RECTAL | Status: DC | PRN
  Administered 2020-10-09: 10 mg via RECTAL
  Filled 2020-10-09: qty 1

## 2020-10-09 MED ORDER — SENNOSIDES-DOCUSATE SODIUM 8.6-50 MG PO TABS
1.0000 | ORAL_TABLET | Freq: Two times a day (BID) | ORAL | Status: DC
  Administered 2020-10-09 – 2020-10-11 (×4): 1 via ORAL
  Filled 2020-10-09 (×4): qty 1

## 2020-10-09 NOTE — Progress Notes (Addendum)
PROGRESS NOTE    Kristina Wilkinson  [id number] DOB: 1928/01/21 DOA: 09/25/2020 PCP: Pcp, No   Brief Narrative:  The patient is a 87 year old elderly female with a past medical history significant for but not limited to diabetes mellitus type 2, hypertension, debility, recurrent falls, history of CNS aneurysm status post clipping, history of DVT back in May 2021 status post Eliquis for 3 months and other comorbidities.  Patient lives alone and she presented to the ED from home with a chief complaint of progressive generalized weakness leading to a fall in both her knees there is bruising and left-sided rib pain.  In the ED she was noted to be in A. fib with RVR she was started on a Cardizem drip and admitted to the hospitalist service.  Subsequently after being placed on a Cardizem drip she converted to sinus rhythm and subsequently changed to oral metoprolol.  Her CHA2DS2-VASc score is at least 6 and she is now on long-term anticoagulation with Eliquis.  Her blood pressure was elevated and is now fairly well controlled with metoprolol, lisinopril and amlodipine.  Urinalysis showed large amount leukocytes plus nitrates and she was treated with dose of fosfomycin. Given her recurrent falls and generalized weakness PT OT recommended SNF but she is very adamant in going home.  She does not have the medical capacity decision-making to refuse SNF at this time and will need SNF placement and she wants to go to [facility] in [place].  She is medically stable to be discharged at this time however does not have a current bed available and likely will have on Monday 10/10/20.  Assessment & Plan:   Principal Problem:   New onset a-fib ([name]) Active Problems:   HTN (hypertension)   Diabetes mellitus without complication (HCC)   Abnormal urinalysis   Leukocytosis   Falls  New onset A. fib with RVR -Initially placed on Cardizem drip.  After conversion to sinus rhythm, patient was switched to oral  metoprolol.   -Currently remains in NSR -Currently stable on Toprol at 12.5 mg daily.  Continue to monitor in telemetry. -CHADSVASC score at least 6 putting her at high risk of stroke.  Per history, she tolerated a 17-monthcourse of Eliquis in May 2021 for DVT.   -Patient agreed to long-term anticoagulation at this time because of A. fib.  Eliquis was started.   Hypertensive urgency -Blood pressure fairly controlled for her age.   -Continue Toprol 12.5 mg daily, Lisinopril 40 mg daily and Amlodipine 10 mg daily.   -Continue to monitor BP per Protocol -Last BP was 156/56   Diabetes Mellitus Type 2 -Well-controlled, A1c 6.1.  Continue SSI. -CBG's ranging from 97-136  Normocytic Anemia -Patient's Hgb/Hct went from 9.6/30.3 -> 11.4/35.7 -> 11.8/37.9 on last check  -Check Anemia Panel as an outpatient  -Continue to Monitor for S/Sx of Bleeding; No overt bleeding noted -Repeat CBC in the AM    UTI -Urinalysis showed large amount of leukocytes, positive nitrates and many bacteria.   -Treated with a dose of Fosfomycin.  Constipation -Had a difficult time and started straining today -Started Bowel Regimen with Miralax 17 gram po BID, Senna-Docusate 1 tab po BID and Bisacodyl 10 mg RC   Generalized Weakness/Recurrent Falls -PT OT recommends SNF.  Patient very adamant on going home.  She does not even want home health.  Patient 87year old and lives alone.  [place] has many medical problems including recent stroke and has  dysarthria and cannot take care of himself.  Dr. Doristine Bosworth spent long time trying to convince her and counselor however she is very adamant on her decision.  Eventually Dr. Doristine Bosworth asked her if he could talk to her family member and she granted permission to talk to her son [name].  I spoke to him however he also had stroke recently and he has significant dysarthria and he is unable to care for himself and he referred me to talk to patient's best  friend, who is also her nephew [name], however he mentioned that we should do what is best for the patient.  Dr. 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  Dr. Doristine Bosworth informed him about recommendations for SNF.  He agreed with that.  He also was on the same page that patient should not go home as she is not doing well.  Dr. Doristine Bosworth requested him to come talk to the patient and convince her to go to rehab.  So he did and patient was agreeable to go to SNF on 'Sunday but then once again she reiterated that she only wants to go home.  She does not have has mental capacity to make the right decision for herself and this was confirmed by Psych Consultation -PT and OT constantly are recommending SNF for [facility].  TOC working on finding a bed and patient now requesting [name]  -Palliative Care consulted for GOC Discussion    DVT prophylaxis: Anticoagulated with Apixaban 5 mg po BID  Code Status: FULL CODE Family Communication: No family present at bedside Disposition Plan: SNF when bed is available   Status is: Inpatient  Remains inpatient appropriate because:Unsafe d/c plan, IV treatments appropriate due to intensity of illness or inability to take PO, and Inpatient level of care appropriate due to severity of illness  Dispo: The patient is from: Home              Anticipated d/c is to: SNF              Patient currently is medically stable to d/c.   Difficult to place patient No  Consultants:  Psychiatry Palliative Care    Procedures: None  Antimicrobials:  Anti-infectives (From admission, onward)    Start     Dose/Rate Route Frequency Ordered Stop   09/26/20 1000  nitrofurantoin (macrocrystal-monohydrate) (MACROBID) capsule 100 mg  Status:  Discontinued        10'$ 0 mg Oral Every 12 hours 09/26/20 0734 09/26/20 0735   09/26/20 0800  fosfomycin (MONUROL)  packet 3 g        3 g Oral  Once 09/26/20 0735 09/26/20 0936        Subjective: Seen and examined at bedside and she was doing okay.  About to have a bowel movement.  No chest pain or shortness of breath.  Feels well.  No other concerns or complaints at this time.  Is medically stable to be discharged to SNF and will repeat a COVID test in anticipation for discharge.  Objective: Vitals:   10/08/20 1254 10/08/20 2132 10/09/20 0500 10/09/20 0548  BP: 133/73 (!) 144/50 (!) 136/58   Pulse: 64 (!) 57 (!) 58   Resp: '20 18 18   '$ Temp: 98.1 F (36.7 C) 97.9 F (36.6 C) 98.2 F (36.8 C)   TempSrc: Oral Oral Oral  SpO2: 100% 97% 98%   Weight:    65.4 kg  Height:        Intake/Output Summary (Last 24 hours) at 10/09/2020 0819 Last data filed at 10/09/2020 0548 Gross per 24 hour  Intake 480 ml  Output 250 ml  Net 230 ml    Filed Weights   10/07/20 0422 10/08/20 0416 10/09/20 0548  Weight: 65.7 kg 67 kg 65.4 kg   Examination: Physical Exam:  Constitutional: Well-nourished, well-developed elderly overweight Caucasian female currently in no acute distress Respiratory: Mildly diminished to auscultation bilaterally Cardiovascular: Regular rate and rhythm.  No appreciable JVD Abdomen: Soft, nontender, slightly distended secondary to her body habitus.  Bowel sounds present Neurologic: Cranial nerves II through XII grossly intact but she is a little hard of hearing  Data Reviewed: I have personally reviewed following labs and imaging studies  CBC: Recent Labs  Lab 10/06/20 0906  WBC 7.3  NEUTROABS 4.8  HGB 11.8*  HCT 37.9  MCV 95.5  PLT 123XX123    Basic Metabolic Panel: Recent Labs  Lab 10/06/20 0906  NA 139  K 4.1  CL 109  CO2 25  GLUCOSE 172*  BUN 29*  CREATININE 0.81  CALCIUM 9.4  MG 2.0  PHOS 2.9    GFR: Estimated Creatinine Clearance: 41.1 mL/min (by C-G formula based on SCr of 0.81 mg/dL). Liver Function Tests: No results for input(s): AST, ALT, ALKPHOS,  BILITOT, PROT, ALBUMIN in the last 168 hours. No results for input(s): LIPASE, AMYLASE in the last 168 hours. No results for input(s): AMMONIA in the last 168 hours. Coagulation Profile: No results for input(s): INR, PROTIME in the last 168 hours. Cardiac Enzymes: No results for input(s): CKTOTAL, CKMB, CKMBINDEX, TROPONINI in the last 168 hours. BNP (last 3 results) No results for input(s): PROBNP in the last 8760 hours. HbA1C: No results for input(s): HGBA1C in the last 72 hours. CBG: Recent Labs  Lab 10/08/20 0727 10/08/20 1154 10/08/20 1215 10/08/20 2137 10/09/20 0732  GLUCAP 99 40* 154* 124* 95    Lipid Profile: No results for input(s): CHOL, HDL, LDLCALC, TRIG, CHOLHDL, LDLDIRECT in the last 72 hours. Thyroid Function Tests: No results for input(s): TSH, T4TOTAL, FREET4, T3FREE, THYROIDAB in the last 72 hours. Anemia Panel: No results for input(s): VITAMINB12, FOLATE, FERRITIN, TIBC, IRON, RETICCTPCT in the last 72 hours. Sepsis Labs: No results for input(s): PROCALCITON, LATICACIDVEN in the last 168 hours.  No results found for this or any previous visit (from the past 240 hour(s)).   RN Pressure Injury Documentation: Pressure Injury 03/07/19 Head Posterior Stage 2 -  Partial thickness loss of dermis presenting as a shallow open injury with a red, pink wound bed without slough. (Active)  03/07/19 1000  Location: Head  Location Orientation: Posterior  Staging: Stage 2 -  Partial thickness loss of dermis presenting as a shallow open injury with a red, pink wound bed without slough.  Wound Description (Comments):   Present on Admission: Yes   Estimated body mass index is 25.54 kg/m as calculated from the following:   Height as of this encounter: '5\' 3"'$  (1.6 m).   Weight as of this encounter: 65.4 kg.  Malnutrition Type:   Malnutrition Characteristics:   Nutrition Interventions:    Radiology Studies: No results found.  Scheduled Meds:  amLODipine  10 mg  Oral Daily   apixaban  5 mg Oral BID   insulin aspart  0-9 Units Subcutaneous TID WC   lisinopril  40 mg Oral Daily  metoprolol succinate  12.5 mg Oral Daily   Continuous Infusions:   LOS: 13 days   Kerney Elbe, DO [facility] PAGER is on AMION  If 7PM-7AM, please contact night-coverage [email or web address]

## 2020-10-10 LAB — CBC WITH DIFFERENTIAL/PLATELET
Abs Immature Granulocytes: 0.02 10*3/uL (ref 0.00–0.07)
Basophils Absolute: 0 10*3/uL (ref 0.0–0.1)
Basophils Relative: 1 %
Eosinophils Absolute: 0.2 10*3/uL (ref 0.0–0.5)
Eosinophils Relative: 3 %
HCT: 33.5 % — ABNORMAL LOW (ref 36.0–46.0)
Hemoglobin: 11.1 g/dL — ABNORMAL LOW (ref 12.0–15.0)
Immature Granulocytes: 0 %
Lymphocytes Relative: 30 %
Lymphs Abs: 2 10*3/uL (ref 0.7–4.0)
MCH: 30.6 pg (ref 26.0–34.0)
MCHC: 33.1 g/dL (ref 30.0–36.0)
MCV: 92.3 fL (ref 80.0–100.0)
Monocytes Absolute: 0.7 10*3/uL (ref 0.1–1.0)
Monocytes Relative: 11 %
Neutro Abs: 3.7 10*3/uL (ref 1.7–7.7)
Neutrophils Relative %: 55 %
Platelets: 185 10*3/uL (ref 150–400)
RBC: 3.63 MIL/uL — ABNORMAL LOW (ref 3.87–5.11)
RDW: 13.2 % (ref 11.5–15.5)
WBC: 6.6 10*3/uL (ref 4.0–10.5)
nRBC: 0 % (ref 0.0–0.2)

## 2020-10-10 LAB — BASIC METABOLIC PANEL
Anion gap: 5 (ref 5–15)
BUN: 31 mg/dL — ABNORMAL HIGH (ref 8–23)
CO2: 25 mmol/L (ref 22–32)
Calcium: 9.7 mg/dL (ref 8.9–10.3)
Chloride: 111 mmol/L (ref 98–111)
Creatinine, Ser: 0.82 mg/dL (ref 0.44–1.00)
GFR, Estimated: >60 mL/min (ref >60)
Glucose, Bld: 101 mg/dL — ABNORMAL HIGH (ref 70–99)
Potassium: 4.3 mmol/L (ref 3.5–5.1)
Sodium: 141 mmol/L (ref 135–145)

## 2020-10-10 LAB — GLUCOSE, CAPILLARY
Glucose-Capillary: 95 mg/dL (ref 70–99)
Glucose-Capillary: 156 mg/dL — ABNORMAL HIGH (ref 70–99)
Glucose-Capillary: 95 mg/dL (ref 70–99)
Glucose-Capillary: 116 mg/dL — ABNORMAL HIGH (ref 70–99)

## 2020-10-10 LAB — PHOSPHORUS: Phosphorus: 3.5 mg/dL (ref 2.5–4.6)

## 2020-10-10 LAB — MAGNESIUM: Magnesium: 2.2 mg/dL (ref 1.7–2.4)

## 2020-10-10 LAB — SARS CORONAVIRUS 2 (TAT 6-24 HRS): SARS Coronavirus 2: NEGATIVE

## 2020-10-10 MED ORDER — LISINOPRIL 40 MG PO TABS: 40.0000 mg | ORAL_TABLET | Freq: Every day | ORAL | Status: DC

## 2020-10-10 MED ORDER — ACETAMINOPHEN 325 MG PO TABS: 650.0000 mg | ORAL_TABLET | Freq: Four times a day (QID) | ORAL | Status: AC | PRN

## 2020-10-10 MED ORDER — POLYVINYL ALCOHOL 1.4 % OP SOLN: 1.0000 [drp] | OPHTHALMIC | 0 refills | Status: DC | PRN

## 2020-10-10 MED ORDER — SENNOSIDES-DOCUSATE SODIUM 8.6-50 MG PO TABS: 1.0000 | ORAL_TABLET | Freq: Two times a day (BID) | ORAL | Status: DC

## 2020-10-10 MED ORDER — POLYETHYLENE GLYCOL 3350 17 G PO PACK: 17.0000 g | PACK | Freq: Two times a day (BID) | ORAL | 0 refills | Status: AC

## 2020-10-10 MED ORDER — APIXABAN 5 MG PO TABS: 5.0000 mg | ORAL_TABLET | Freq: Two times a day (BID) | ORAL | Status: DC

## 2020-10-10 MED ORDER — METOPROLOL SUCCINATE ER 25 MG PO TB24
12.5000 mg | ORAL_TABLET | Freq: Every day | ORAL | Status: DC
Start: 2020-10-10 — End: 2021-05-02

## 2020-10-10 MED ORDER — AMLODIPINE BESYLATE 10 MG PO TABS: 10.0000 mg | ORAL_TABLET | Freq: Every day | ORAL | Status: DC

## 2020-10-10 MED ORDER — BISACODYL 10 MG RE SUPP: 10.0000 mg | Freq: Every day | RECTAL | 0 refills | Status: DC | PRN

## 2020-10-10 MED ORDER — ONDANSETRON HCL 4 MG PO TABS: 4.0000 mg | ORAL_TABLET | Freq: Four times a day (QID) | ORAL | 0 refills | Status: DC | PRN

## 2020-10-10 MED ORDER — ALBUTEROL SULFATE (2.5 MG/3ML) 0.083% IN NEBU
2.5000 mg | INHALATION_SOLUTION | RESPIRATORY_TRACT | 12 refills | Status: DC | PRN
Start: 2020-10-10 — End: 2021-04-01

## 2020-10-10 NOTE — Discharge Summary (Addendum)
Physician Discharge Summary  Kristina Wilkinson [id number] DOB: 04-09-1927 DOA: 09/25/2020  PCP: Pcp, No  Admit date: 09/25/2020 Discharge date: 10/10/2020  Admitted From: Home Disposition: SNF  Recommendations for Outpatient Follow-up:  Follow up with PCP in 1-2 weeks; Will need to establish with a New PCP Outpatient Palliative Follow at SNF Please obtain CMP/CBC, Mag, Phos in one week Please follow up on the following pending results:  Home Health: No Equipment/Devices: None  Discharge Condition: Stable CODE STATUS: FULL CODE Diet recommendation: Heart Healthy Carb Modified Diet  Brief/Interim Summary: The patient is a 87 year old elderly female with a past medical history significant for but not limited to diabetes mellitus type 2, hypertension, debility, recurrent falls, history of CNS aneurysm status post clipping, history of DVT back in May 2021 status post Eliquis for 3 months and other comorbidities.  Patient lives alone and she presented to the ED from home with a chief complaint of progressive generalized weakness leading to a fall in both her knees there is bruising and left-sided rib pain.  In the ED she was noted to be in A. fib with RVR she was started on a Cardizem drip and admitted to the hospitalist service.  Subsequently after being placed on a Cardizem drip she converted to sinus rhythm and subsequently changed to oral metoprolol.  Her CHA2DS2-VASc score is at least 6 and she is now on long-term anticoagulation with Eliquis.  Her blood pressure was elevated and is now fairly well controlled with metoprolol, lisinopril and amlodipine.  Urinalysis showed large amount leukocytes plus nitrates and she was treated with dose of fosfomycin. Given her recurrent falls and generalized weakness PT OT recommended SNF but she is very adamant in going home.  She does not have the medical capacity decision-making to refuse SNF at this time and will need SNF placement and she wants to go to  [facility] in [place].  She is medically stable to be discharged at this time.  Discharge Diagnoses:  Principal Problem:   New onset a-fib [facility]) Active Problems:   HTN (hypertension)   Diabetes mellitus without complication (HCC)   Abnormal urinalysis   Leukocytosis   Falls  New onset A. fib with RVR -Initially placed on Cardizem drip.  After conversion to sinus rhythm, patient was switched to oral metoprolol.   -Currently remains in NSR -Currently stable on Toprol at 12.5 mg daily.  Continue to monitor in telemetry. -CHADSVASC score at least 6 putting her at high risk of stroke.  Per history, she tolerated a 31-monthcourse of Eliquis in May 2021 for DVT.   -Patient agreed to long-term anticoagulation at this time because of A. fib. Eliquis was started.   Hypertensive urgency -Blood pressure fairly controlled for her age.   -Continue Toprol 12.5 mg daily, Lisinopril 40 mg daily and Amlodipine 10 mg daily.   -Continue to monitor BP per Protocol -Last BP was 142/88   Diabetes Mellitus Type 2 -Well-controlled, A1c 6.1.  Continue SSI. -CBG's ranging from 95-152   Normocytic Anemia -Patient's Hgb/Hct went from 9.6/30.3 -> 11.4/35.7 -> 11.8/37.9 -> 11.1/33.5 -Check Anemia Panel as an outpatient  -Continue to Monitor for S/Sx of Bleeding; No overt bleeding noted -Repeat CBC in the AM    UTI -Urinalysis showed large amount of leukocytes, positive nitrates and many bacteria.   -Treated with a dose of Fosfomycin.   Constipation, improved -Had a difficult time and started straining yesterday -Started Bowel Regimen with Miralax 17 gram po BID, Senna-Docusate 1 tab  po BID and Bisacodyl 10 mg RC -Improved and nursing states she had a good bowel movement yesterday   Generalized Weakness/Recurrent Falls -PT OT recommends SNF.  Patient very adamant on going home.  She does not even want home health.  Patient 87 year old and lives alone.  Has a son who lives in [place] and himself  has many medical problems including recent stroke and has dysarthria and cannot take care of himself.  Dr. Doristine Bosworth spent long time trying to convince her and counselor however she is very adamant on her decision.  Eventually Dr. Doristine Bosworth asked her if he could talk to her family member and she granted permission to talk to her son Kristina Wilkinson.  I spoke to him however he also had stroke recently and he has significant dysarthria and he is unable to care for himself and he referred me to talk to patient's best friend, who is also her nephew Kristina Wilkinson, however he mentioned that we should do what is best for the patient.  Dr. 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  Dr. Doristine Bosworth informed him about recommendations for SNF.  He agreed with that.  He also was on the same page that patient should not go home as she is not doing well.  Dr. Doristine Bosworth requested him to come talk to the patient and convince her to go to rehab.  So he did and patient was agreeable to go to SNF on Sunday but then once again she reiterated that she only wants to go home.  She does not have has mental capacity to make the right decision for herself and this was confirmed by Psych Consultation -PT and OT constantly are recommending SNF for [facility] Re[facility].  TOC working on finding a bed and patient now requesting [place] consulted for [place] Discussion and they will follow her at [facility]   Discharge Instructions  Discharge Instructions     Amb referral to [facility]   Complete by: As directed    Call MD for:  difficulty breathing, headache or visual disturbances   Complete by: As directed    Call MD for:  extreme fatigue   Complete by: As directed    Call MD for:  hives   Complete by: As directed    Call MD for:  persistant dizziness or light-headedness   Complete by: As directed    Call MD for:   persistant nausea and vomiting   Complete by: As directed    Call MD for:  redness, tenderness, or signs of infection (pain, swelling, redness, odor or green/yellow discharge around incision site)   Complete by: As directed    Call MD for:  severe uncontrolled pain   Complete by: As directed    Call MD for:  temperature >100.4   Complete by: As directed    Diet - low sodium heart healthy   Complete by: As directed    Discharge instructions   Complete by: As directed    You were cared for by a hospitalist during your hospital stay. If you have any questions about your discharge medications or the care you received while you were in the hospital after you are discharged, you can call the unit and ask to speak with the hospitalist on call if the hospitalist that took care of you is not available.  Once you are discharged, your primary care physician will handle any further medical issues. Please note that NO REFILLS for any discharge medications will be authorized once you are discharged, as it is imperative that you return to your primary care physician (or establish a relationship with a primary care physician if you do not have one) for your aftercare needs so that they can reassess your need for medications and monitor your lab values.  Follow up with PCP, Cardiology, and Palliative Care Medicine as an outpaitent. Take all medications as prescribed. If symptoms change or worsen please return to the ED for evaluation   Discharge wound care:   Complete by: As directed    Keep MASD clean and Dry   Increase activity slowly   Complete by: As directed       Allergies as of 10/10/2020       Reactions   Gabapentin Other (See Comments), Itching   Made the legs "go numb" Made the legs "go numb"   Other Other (See Comments)   "MD mixed a blood pressure medication with the Clinoril I was taking and I couldn't walk" Other reaction(s): Other (See Comments) "MD mixed a blood pressure medication with  the Clinoril I was taking and I couldn't walk" "Made me achy and sick"   Penicillins Other (See Comments), Itching   Has patient had a PCN reaction causing immediate rash, facial/tongue/throat swelling, SOB or lightheadedness with hypotension: Unk- exact reaction not recalled, but is allergic Has patient had a PCN reaction causing severe rash involving mucus membranes or skin necrosis: Unk Has patient had a PCN reaction that required hospitalization: Unk Has patient had a PCN reaction occurring within the last 10 years: Yes If all of the above answers are "NO", then may proceed with Cephalosporin use. Itching and rash   Pregabalin Other (See Comments)   "Made me pass out" Other reaction(s): Other (See Comments) Made limbs go numb   Vit D-vit E-safflower Oil Other (See Comments)   "Made me achy and sick"        Medication List     STOP taking these medications    diclofenac Sodium 1 % Gel Commonly known as: VOLTAREN   feeding supplement (PRO-STAT SUGAR FREE 64) Liqd   hydrALAZINE 25 MG tablet Commonly known as: APRESOLINE   rOPINIRole 0.25 MG tablet Commonly known as: REQUIP       TAKE these medications    acetaminophen 325 MG tablet Commonly known as: TYLENOL Take 2 tablets (650 mg total) by mouth every 6 (six) hours as needed for mild pain (or Fever >/= 101).   albuterol (2.5 MG/3ML) 0.083% nebulizer solution Commonly known as: PROVENTIL Take 3 mLs (2.5 mg total) by nebulization every 4 (four) hours as needed for wheezing or shortness of breath.   amLODipine 10 MG tablet Commonly known as: NORVASC Take 1 tablet (10 mg total) by mouth daily.   apixaban 5 MG Tabs tablet Commonly known as: ELIQUIS Take 1 tablet (5 mg total) by mouth 2 (two) times daily.   bisacodyl 10 MG suppository Commonly known as: DULCOLAX Place 1 suppository (10 mg total) rectally daily as needed for moderate constipation.   lisinopril 40 MG tablet Commonly known as: ZESTRIL Take 1  tablet (40 mg total) by mouth daily.   metoprolol succinate 25 MG 24 hr tablet Commonly known as: TOPROL-XL Take 0.5 tablets (12.5 mg total) by mouth daily.   ondansetron 4 MG tablet Commonly known as: ZOFRAN Take 1 tablet (4  mg total) by mouth every 6 (six) hours as needed for nausea.   polyethylene glycol 17 g packet Commonly known as: MIRALAX / GLYCOLAX Take 17 g by mouth 2 (two) times daily.   polyvinyl alcohol 1.4 % ophthalmic solution Commonly known as: LIQUIFILM TEARS Place 1 drop into both eyes as needed for dry eyes.   senna-docusate 8.6-50 MG tablet Commonly known as: Senokot-S Take 1 tablet by mouth 2 (two) times daily.               Discharge Care Instructions  (From admission, onward)           Start     Ordered   10/10/20 0000  Discharge wound care:       Comments: Keep MASD clean and Dry   10/10/20 0830            Allergies  Allergen Reactions   Gabapentin Other (See Comments) and Itching    Made the legs "go numb" Made the legs "go numb"   Other Other (See Comments)    "MD mixed a blood pressure medication with the Clinoril I was taking and I couldn't walk" Other reaction(s): Other (See Comments) "MD mixed a blood pressure medication with the Clinoril I was taking and I couldn't walk" "Made me achy and sick"   Penicillins Other (See Comments) and Itching    Has patient had a PCN reaction causing immediate rash, facial/tongue/throat swelling, SOB or lightheadedness with hypotension: Unk- exact reaction not recalled, but is allergic Has patient had a PCN reaction causing severe rash involving mucus membranes or skin necrosis: Unk Has patient had a PCN reaction that required hospitalization: Unk Has patient had a PCN reaction occurring within the last 10 years: Yes If all of the above answers are "NO", then may proceed with Cephalosporin use.  Itching and rash   Pregabalin Other (See Comments)    "Made me pass out" Other reaction(s):  Other (See Comments) Made limbs go numb   Vit D-Vit E-Safflower Oil Other (See Comments)    "Made me achy and sick"   Consultations: Palliative Care Medicine Psychiatry   Procedures/Studies: CT HEAD WO CONTRAST (5MM)  Result Date: 09/25/2020 CLINICAL DATA:  Fall.  Facial trauma EXAM: CT HEAD WITHOUT CONTRAST TECHNIQUE: Contiguous axial images were obtained from the base of the skull through the vertex without intravenous contrast. COMPARISON:  None. FINDINGS: Brain: No acute intracranial hemorrhage. No focal mass lesion. No CT evidence of acute infarction. No midline shift or mass effect. No hydrocephalus. Basilar cisterns are patent. Ossific fragment is seen in lower brainstem and LEFT occipital lobe not changed from prior presumed related to prior trauma. There are periventricular and subcortical white matter hypodensities. Generalized cortical atrophy. Vascular: No hyperdense vessel or unexpected calcification. Skull: Prior surgery in the occipital bone midline. Sinuses/Orbits: Paranasal sinuses and mastoid air cells are clear. Orbits are clear. Other: None. IMPRESSION: 1. No intracranial trauma. 2. RIGHT trauma in the posterior fossa. 3. Atrophy and white matter microvascular disease Electronically Signed   By: Suzy Bouchard M.D.   On: 09/25/2020 05:05   DG Shoulder Left  Result Date: 09/25/2020 CLINICAL DATA:  87 year old female status post fall with pain. EXAM: LEFT SHOULDER - 2+ VIEW COMPARISON:  Left shoulder series 11/30/2018. FINDINGS: Three portable views at 0354 hours. Bone mineralization is stable since 2020, within normal limits for age. No glenohumeral joint dislocation. Intact proximal right humerus. Right clavicle and scapula appear intact. Moderate chronic left AC joint degenerative spurring.  Negative visible left ribs and chest. Tortuous thoracic aorta with calcified atherosclerosis. IMPRESSION: No acute fracture or dislocation identified about the left shoulder. Aortic  Atherosclerosis (ICD10-I70.0). Electronically Signed   By: Genevie Ann M.D.   On: 09/25/2020 04:10   DG Knee Complete 4 Views Left  Result Date: 09/25/2020 CLINICAL DATA:  87 year old female status post fall with pain. EXAM: LEFT KNEE - COMPLETE 4+ VIEW COMPARISON:  Left knee series 11/30/2018. FINDINGS: Chronic medial hemiarthroplasty. Hardware appears stable and intact. No joint effusion on the cross-table lateral view. Superimposed chronic severe patellofemoral and lateral compartment degeneration. Joint space loss and degenerative spurring. No acute osseous abnormality identified. No discrete soft tissue injury. IMPRESSION: 1. No acute fracture or dislocation identified about the left knee. 2. Chronic medial compartment hemiarthroplasty with advanced lateral and patellofemoral compartment degeneration. Electronically Signed   By: Genevie Ann M.D.   On: 09/25/2020 04:14   ECHOCARDIOGRAM COMPLETE  Result Date: 09/26/2020    ECHOCARDIOGRAM REPORT   Patient Name:   Kristina Wilkinson Date of Exam: 09/26/2020 Medical Rec #:  [id number]      Height:       63.0 in Accession #:    [id number]     Weight:       150.0 lb Date of Birth:  September 14, 1927       BSA:          25.711 m Patient Age:    69 years       BP:           177/54 mmHg Patient Gender: F              HR:           61 bpm. Exam Location:  Inpatient Procedure: 2D Echo, 3D Echo, Cardiac Doppler and Color Doppler Indications:    R07.9* Chest pain, unspecified  History:        Patient has prior history of Echocardiogram examinations, most                 recent 07/14/2016. Abnormal ECG, Arrythmias:Atrial Fibrillation                 and Bradycardia; Risk Factors:Hypertension, Former Smoker and                 Diabetes.  Sonographer:    Roseanna Rainbow RDCS Referring Phys: [id number] [facility] A THOMAS  Sonographer Comments: Technically difficult study due to poor echo windows. Image transfer delay, no syngo access in ED. IMPRESSIONS  1. The mitral valve is abnormal with prominent  mitral annular calcification. Trivial mitral valve regurgitation. Mild to moderate mitral stenosis. Mean gradient of 3 mm Hg at heart rate of 64 but MVA by continuity equation ~ 1.5 cm2.  2. Left ventricular ejection fraction, by estimation, is 65%. Left ventricular ejection fraction by 3D volume is 61 %. The left ventricle has normal function. The left ventricle has no regional wall motion abnormalities. Left ventricular diastolic parameters are indeterminate.  3. Right ventricular systolic function is normal. The right ventricular size is normal. There is mildly elevated pulmonary artery systolic pressure. The estimated right ventricular systolic pressure is [id number] mmHg.  4. The aortic valve is tricuspid. There is moderate calcification of the aortic valve. Aortic valve regurgitation is not visualized.  5. The inferior vena cava is dilated in size with <50% respiratory variability, suggesting right atrial pressure of 15 mmHg. Comparison(s): A prior study was performed on 07/14/2016. Prior images reviewed side by  side. Increase in mitral valve gradients. FINDINGS  Left Ventricle: Left ventricular ejection fraction, by estimation, is 65%. Left ventricular ejection fraction by 3D volume is 61 %. The left ventricle has normal function. The left ventricle has no regional wall motion abnormalities. The left ventricular internal cavity size was normal in size. There is no left ventricular hypertrophy. Left ventricular diastolic parameters are indeterminate. Right Ventricle: The right ventricular size is normal. No increase in right ventricular wall thickness. Right ventricular systolic function is normal. There is mildly elevated pulmonary artery systolic pressure. The tricuspid regurgitant velocity is 2.59  m/s, and with an assumed right atrial pressure of 15 mmHg, the estimated right ventricular systolic pressure is [id number] mmHg. Left Atrium: Left atrial size was normal in size. Right Atrium: Right atrial size was normal in  size. Pericardium: There is no evidence of pericardial effusion. Mitral Valve: The mitral valve is abnormal. Moderate mitral annular calcification. Trivial mitral valve regurgitation. Mild to moderate mitral valve stenosis. MV peak gradient, 9.0 mmHg. The mean mitral valve gradient is 3.0 mmHg with average heart rate of 64 bpm. Tricuspid Valve: The tricuspid valve is normal in structure. Tricuspid valve regurgitation is trivial. Aortic Valve: The aortic valve is tricuspid. There is moderate calcification of the aortic valve. There is mild aortic valve annular calcification. Aortic valve regurgitation is not visualized. Aortic valve mean gradient measures 5.0 mmHg. Aortic valve peak gradient measures 9.4 mmHg. Aortic valve area, by VTI measures 1.73 cm. Pulmonic Valve: The pulmonic valve was grossly normal. Pulmonic valve regurgitation is not visualized. No evidence of pulmonic stenosis. Aorta: The aortic root and ascending aorta are structurally normal, with no evidence of dilitation. Venous: The inferior vena cava is dilated in size with less than 50% respiratory variability, suggesting right atrial pressure of 15 mmHg. IAS/Shunts: The atrial septum is grossly normal.  LEFT VENTRICLE PLAX 2D LVIDd:         4.60 cm LVIDs:         2.70 cm LV PW:         1.00 cm         3D Volume EF LV IVS:        1.00 cm         LV 3D EF:    Left LVOT diam:     1.80 cm                      ventricul LV SV:         73                           ar LV SV Index:   43                           ejection LVOT Area:     2.54 cm                     fraction                                             by 3D  volume is LV Volumes (MOD)                            61 %. LV vol d, MOD    58.0 ml A2C: LV vol d, MOD    48.0 ml       3D Volume EF: A4C:                           3D EF:        61 % LV vol s, MOD    16.5 ml       LV EDV:       104 ml A2C:                           LV ESV:       41 ml LV vol  s, MOD    16.5 ml       LV SV:        63 ml A4C: LV SV MOD A2C:   41.5 ml LV SV MOD A4C:   48.0 ml LV SV MOD BP:    38.0 ml RIGHT VENTRICLE            IVC RV S prime:     8.16 cm/s  IVC diam: 2.40 cm TAPSE (M-mode): 1.7 cm LEFT ATRIUM             Index       RIGHT ATRIUM           Index LA diam:        4.40 cm 2.57 cm/m  RA Area:     12.40 cm LA Vol (A2C):   52.3 ml 30.56 ml/m RA Volume:   26.50 ml  15.49 ml/m LA Vol (A4C):   50.2 ml 29.34 ml/m LA Biplane Vol: 56.5 ml 33.02 ml/m  AORTIC VALVE AV Area (Vmax):    2.01 cm AV Area (Vmean):   1.95 cm AV Area (VTI):     1.73 cm AV Vmax:           153.00 cm/s AV Vmean:          107.000 cm/s AV VTI:            0.421 m AV Peak Grad:      9.4 mmHg AV Mean Grad:      5.0 mmHg LVOT Vmax:         121.00 cm/s LVOT Vmean:        82.200 cm/s LVOT VTI:          0.286 m LVOT/AV VTI ratio: 0.68  AORTA Ao Root diam: 2.60 cm Ao Asc diam:  3.50 cm MITRAL VALVE                TRICUSPID VALVE MV Area (PHT): 2.98 cm     TR Peak grad:   26.8 mmHg MV Area VTI:   1.30 cm     TR Vmax:        259.00 cm/s MV Peak grad:  9.0 mmHg MV Mean grad:  3.0 mmHg     SHUNTS MV Vmax:       1.50 m/s     Systemic VTI:  0.29 m MV Vmean:      72.6 cm/s    Systemic Diam: 1.80 cm MV Decel Time: 255 msec MV E velocity: 138.33 cm/s MV A  velocity: 133.00 cm/s MV E/A ratio:  1.04 Rudean Haskell MD Electronically signed by Rudean Haskell MD Signature Date/Time: 09/26/2020/11:03:51 AM    Final    DG Hip Unilat W or Wo Pelvis 2-3 Views Left  Result Date: 09/25/2020 CLINICAL DATA:  87 year old female status post fall with pain. EXAM: DG HIP (WITH OR WITHOUT PELVIS) 2-3V LEFT COMPARISON:  CT Abdomen and Pelvis 03/05/2019. Pelvis radiograph 11/30/2018. FINDINGS: Osteopenia. Femoral heads remain normally located. Asymmetric enthesophyte ptosis suspected along the caudal right hip joint. Right gluteal dystrophic calcifications on the prior CT now project over the right femoral neck. Pelvis appears  stable and intact. Grossly intact proximal right femur. Proximal left femur appears intact. SI joints appear symmetric. No acute osseous abnormality identified. Negative visible bowel gas pattern. IMPRESSION: No acute fracture or dislocation identified about the left hip or pelvis. Electronically Signed   By: Genevie Ann M.D.   On: 09/25/2020 04:13     Subjective: Seen and examined at bedside and she is doing well.  She denies any chest pain or shortness of breath.  No lightheadedness or dizziness.  No nausea or vomiting.  No other concerns or complaints at this time.  Discharge Exam: Vitals:   10/09/20 2049 10/10/20 0419  BP: (!) 134/57 (!) 142/88  Pulse: (!) 55 62  Resp:    Temp: 98.2 F (36.8 C) 98.4 F (36.9 C)  SpO2:  99%   Vitals:   10/09/20 1520 10/09/20 2049 10/10/20 0419 10/10/20 0441  BP: (!) 127/51 (!) 134/57 (!) 142/88   Pulse: (!) 59 (!) 55 62   Resp: 18     Temp: 97.8 F (36.6 C) 98.2 F (36.8 C) 98.4 F (36.9 C)   TempSrc: Oral Oral Oral   SpO2: 100%  99%   Weight:    65.7 kg  Height:       General: Pt is alert, awake, not in acute distress; she is hard of hearing Cardiovascular: RRR, S1/S2 +, no rubs, no gallops Respiratory: Slightly diminished bilaterally, no wheezing, no rhonchi Abdominal: Soft, NT, distended secondary body habitus, bowel sounds + Extremities: Minimal edema, no cyanosis  The results of significant diagnostics from this hospitalization (including imaging, microbiology, ancillary and laboratory) are listed below for reference.    Microbiology: Recent Results (from the past 240 hour(s))  SARS CORONAVIRUS 2 (TAT 6-24 HRS) Nasopharyngeal Nasopharyngeal Swab     Status: None   Collection Time: 10/09/20  3:47 PM   Specimen: Nasopharyngeal Swab  Result Value Ref Range Status   SARS Coronavirus 2 NEGATIVE NEGATIVE Final    Comment: (NOTE) SARS-CoV-2 target nucleic acids are NOT DETECTED.  The SARS-CoV-2 RNA is generally detectable in upper and  lower respiratory specimens during the acute phase of infection. Negative results do not preclude SARS-CoV-2 infection, do not rule out co-infections with other pathogens, and should not be used as the sole basis for treatment or other patient management decisions. Negative results must be combined with clinical observations, patient history, and epidemiological information. The expected result is Negative.  Fact Sheet for Patients: SugarRoll.be  Fact Sheet for Healthcare Providers: https://www.woods-mathews.com/  This test is not yet approved or cleared by the [place] FDA and  has been authorized for detection and/or diagnosis of SARS-CoV-2 by FDA under an Emergency Use Authorization (EUA). This EUA will remain  in effect (meaning this test can be used) for the duration of the COVID-19 declaration under Se ction 564(b)(1) of the Act, 21 U.S.C. section 360bbb-3(b)(1), unless the  authorization is terminated or revoked sooner.  Performed at [facility], [place] [address] [place], [place] [address]     Labs: BNP (last 3 results) No results for input(s): BNP in the last 8760 hours. Basic Metabolic Panel: Recent Labs  Lab 10/06/20 0906 10/10/20 0440  NA 139 141  K 4.1 4.3  CL 109 111  CO2 25 25  GLUCOSE 172* 101*  BUN 29* 31*  CREATININE 0.81 0.82  CALCIUM 9.4 9.7  MG 2.0 2.2  PHOS 2.9 3.5   Liver Function Tests: No results for input(s): AST, ALT, ALKPHOS, BILITOT, PROT, ALBUMIN in the last 168 hours. No results for input(s): LIPASE, AMYLASE in the last 168 hours. No results for input(s): AMMONIA in the last 168 hours. CBC: Recent Labs  Lab 10/06/20 0906 10/10/20 0440  WBC 7.3 6.6  NEUTROABS 4.8 3.7  HGB 11.8* 11.1*  HCT 37.9 33.5*  MCV 95.5 92.3  PLT 202 185   Cardiac Enzymes: No results for input(s): CKTOTAL, CKMB, CKMBINDEX, TROPONINI in the last 168 hours. BNP: Invalid input(s): POCBNP CBG: Recent  Labs  Lab 10/09/20 1134 10/09/20 1721 10/09/20 2049 10/10/20 0740 10/10/20 1104  GLUCAP 152* 129* 128* 95 156*   D-Dimer No results for input(s): DDIMER in the last 72 hours. Hgb A1c No results for input(s): HGBA1C in the last 72 hours. Lipid Profile No results for input(s): CHOL, HDL, LDLCALC, TRIG, CHOLHDL, LDLDIRECT in the last 72 hours. Thyroid function studies No results for input(s): TSH, T4TOTAL, T3FREE, THYROIDAB in the last 72 hours.  Invalid input(s): FREET3 Anemia work up No results for input(s): VITAMINB12, FOLATE, FERRITIN, TIBC, IRON, RETICCTPCT in the last 72 hours. Urinalysis    Component Value Date/Time   COLORURINE YELLOW 09/26/2020 0526   APPEARANCEUR CLOUDY (A) 09/26/2020 0526   LABSPEC 1.020 09/26/2020 0526   PHURINE 6.0 09/26/2020 0526   GLUCOSEU NEGATIVE 09/26/2020 0526   GLUCOSEU NEGATIVE 01/16/2011 0914   HGBUR MODERATE (A) 09/26/2020 0526   BILIRUBINUR NEGATIVE 09/26/2020 0526   BILIRUBINUR neg 05/01/2017 1220   KETONESUR NEGATIVE 09/26/2020 0526   PROTEINUR 30 (A) 09/26/2020 0526   UROBILINOGEN 0.2 05/01/2017 1220   UROBILINOGEN 0.2 04/14/2012 0930   NITRITE POSITIVE (A) 09/26/2020 0526   LEUKOCYTESUR LARGE (A) 09/26/2020 0526   Sepsis Labs Invalid input(s): PROCALCITONIN,  WBC,  LACTICIDVEN Microbiology Recent Results (from the past 240 hour(s))  SARS CORONAVIRUS 2 (TAT 6-24 HRS) Nasopharyngeal Nasopharyngeal Swab     Status: None   Collection Time: 10/09/20  3:47 PM   Specimen: Nasopharyngeal Swab  Result Value Ref Range Status   SARS Coronavirus 2 NEGATIVE NEGATIVE Final    Comment: (NOTE) SARS-CoV-2 target nucleic acids are NOT DETECTED.  The SARS-CoV-2 RNA is generally detectable in upper and lower respiratory specimens during the acute phase of infection. Negative results do not preclude SARS-CoV-2 infection, do not rule out co-infections with other pathogens, and should not be used as the sole basis for treatment or other  patient management decisions. Negative results must be combined with clinical observations, patient history, and epidemiological information. The expected result is Negative.  Fact Sheet for Patients: SugarRoll.be  Fact Sheet for Healthcare Providers: https://www.woods-mathews.com/  This test is not yet approved or cleared by the [place] FDA and  has been authorized for detection and/or diagnosis of SARS-CoV-2 by FDA under an Emergency Use Authorization (EUA). This EUA will remain  in effect (meaning this test can be used) for the duration of the COVID-19 declaration under  Se ction 564(b)(1) of the Act, 21 U.S.C. section 360bbb-3(b)(1), unless the authorization is terminated or revoked sooner.  Performed at [facility], [place] [address] [place], [place] [address]    Time coordinating discharge: 35 minutes  SIGNED:  Kerney Elbe, DO [facility] 10/10/2020, 11:58 AM Pager is on [place]  If 7PM-7AM, please contact night-coverage [email or web address]

## 2020-10-10 NOTE — Progress Notes (Signed)
Physical Therapy Treatment Patient Details Name: Kristina Wilkinson MRN: [id number] DOB: 11/08/1927 Today's Date: 10/10/2020    History of Present Illness Pt admitted from home 2* multiple falls, weakness and c/o L knee, L shoulder and hip pain - on imaging no acute findings. Pt found to have New onset A. fib with RVR and hypertensive urgency. Pt with hx of DM, LBP, L TKR, cerebral aneurysm repair, peripheral neuropathy, and multiple falls -"I call the fire department to get me up"    PT Comments    Pt assisted with performing sit to stands.  Pt declines sitting in recliner stating recliner is uncomfortable and reluctant to walk due to fear of falling. Pt fatigued quickly and requested back to bed.  Continue to recommend SNF upon d/c.    Follow Up Recommendations  SNF     Equipment Recommendations  None recommended by PT    Recommendations for Other Services       Precautions / Restrictions Precautions Precautions: Fall Precaution Comments: multiple falls at home, [facility]    Mobility  Bed Mobility Overal bed mobility: Needs Assistance Bed Mobility: Supine to Sit;Sit to Supine     Supine to sit: Min assist Sit to supine: Supervision   General bed mobility comments: assist for upper body, increased time and effort    Transfers Overall transfer level: Needs assistance Equipment used: Rolling walker (2 wheeled) Transfers: Sit to/from Stand Sit to Stand: Min assist         General transfer comment: pt tends to state she "can't" however encouraged to perform as able, assist to rise and steady, performed sit to stands x4 for technique and strengthening, attempted marching in place however pt states she can't move her feet and fearful of falling  Ambulation/Gait                 Stairs             Wheelchair Mobility    Modified Rankin (Stroke Patients Only)       Balance Overall balance assessment: Needs assistance;History of Falls           Standing  balance-Leahy Scale: Poor                              Cognition Arousal/Alertness: Awake/alert Behavior During Therapy: WFL for tasks assessed/performed Overall Cognitive Status: Within Functional Limits for tasks assessed                                 General Comments: very HOH      Exercises      General Comments        Pertinent Vitals/Pain      Home Living                      Prior Function            PT Goals (current goals can now be found in the care plan section) Progress towards PT goals: Progressing toward goals    Frequency    Min 2X/week      PT Plan Current plan remains appropriate    Co-evaluation              AM-PAC PT "6 Clicks" Mobility   Outcome Measure  Help needed turning from your back to your side while in a flat bed without  using bedrails?: A Little Help needed moving from lying on your back to sitting on the side of a flat bed without using bedrails?: A Little Help needed moving to and from a bed to a chair (including a wheelchair)?: A Lot Help needed standing up from a chair using your arms (e.g., wheelchair or bedside chair)?: A Lot Help needed to walk in hospital room?: A Lot Help needed climbing 3-5 steps with a railing? : Total 6 Click Score: 13    End of Session Equipment Utilized During Treatment: Gait belt Activity Tolerance: Patient tolerated treatment well Patient left: in bed;with call bell/phone within reach Nurse Communication: Mobility status PT Visit Diagnosis: Unsteadiness on feet (R26.81);Muscle weakness (generalized) (M62.81);History of falling (Z91.81)     Time: [id number] PT Time Calculation (min) (ACUTE ONLY): 14 min  Charges:  $Therapeutic Activity: 8-22 mins                    Jannette Spanner PT, DPT Acute Rehabilitation Services Pager: [phone] Office: [phone]  [place] Ram E 10/10/2020, 1:11 PM

## 2020-10-10 NOTE — Consult Note (Signed)
Consultation Note Date: 10/10/2020   Patient Name: Kristina Wilkinson  DOB: 12/17/1927  MRN: [id number]  Age / Sex: 87 y.o., female  PCP: Pcp, No Referring Physician: Kerney Elbe, DO  Reason for Consultation: Establishing goals of care  HPI/Patient Profile: 87 y.o. female  with past medical history of diabetes, hypertension, debility, recurrent falls, aneurysm status post clipping, DVT admitted on 09/25/2020 with fall at home.  She was noted to be in A. fib and started on Cardizem drip and subsequently converted to sinus rhythm.  UA showed likely UTI which was treated.  Recommendation was for skilled facility but she initially was adamantly opposed to anything other than going home.  With time and consideration, however, she has now agreeable to placement to [facility] in [place].  Awaiting bed availability.  Clinical Assessment and Goals of Care: I met today with Kristina Wilkinson.   I introduced palliative care as specialized medical care for people living with serious illness. It focuses on providing relief from the symptoms and stress of a serious illness. The goal is to improve quality of life for both the patient and the family.  We discussed clinical course as well as wishes moving forward in regard to advanced directives.  We discussed difference between a aggressive medical intervention path and a palliative, comfort focused care path.  Values and goals of care important to patient and family were attempted to be elicited.  Kristina Wilkinson reports that being at home is about most importance, however she understands she is not safe to be alone in her current state.  She is agreeable to transition to skilled facility for trial of rehab but she does note that she is hopeful to eventually be able to get back to her own home.  We discussed her social situation and she tells me that she has a nephew, Vevelyn Royals, who is  local and helps her.  Her son is located in [place] and has medical problems of his own.  Questions and concerns addressed.   PMT will continue to support holistically.   SUMMARY OF RECOMMENDATIONS   - Full code/ full scope - Noted concerns regarding her capacity.  Today, she is agreeable to transition to skilled facility for trial of rehab.  She does tell me that she eventually hopes to get back to home.  We did review MOST form that is on file and she feels that it is in line with her current wishes.  While it is noted that there are concerns about her overall capacity, I would consider this MOST to be the most valid indicator of her wishes until it is determined if she regains capacity to designate other desires. - She tells me that her nephew, Vevelyn Royals, is who she relies on to help her make decisions.  She does not have any advanced directive for healthcare power of attorney on file.  Legally, her son would be her surrogate, however it appears from chart review he has declined act in this capacity. - Overall, I would recommend  outpatient palliative care follow-up to reassess her once she is out of the hospital to determine her ability to make decisions and complete a new MOST form if needed. -She tells me that she needs to find a new primary care physician.  Not sure if TOC can assist with providing list of possible PCPs at time of discharge?  Code Status/Advance Care Planning: Full code  Additional Recommendations (Limitations, Scope, Preferences): Full Scope Treatment  Psycho-social/Spiritual:  Desire for further Chaplaincy support:yes Additional Recommendations: Caregiving  Support/Resources  Discharge Planning: [place] for rehab with Palliative care service follow-up      Primary Diagnoses: Present on Admission:  New onset a-fib ([place])  Abnormal urinalysis  Leukocytosis   I have reviewed the medical record, interviewed the patient and family, and examined the  patient. The following aspects are pertinent.  Past Medical History:  Diagnosis Date   Arthritis    CNS aneurysm 25-Apr-1981   s/p clipping   Diabetes mellitus type II    diet controlled    DVT (deep venous thrombosis) (H[facility]) 10/15   Elevated glucose    Hypertension    Insomnia    Knee pain    Left Dr. Meyer Cory   LBP (low back pain)    PN (peripheral neuropathy)    unknown etiol   Social History   Socioeconomic History   Marital status: Widowed    Spouse name: Not on file   Number of children: 1   Years of education: 89   Highest education level: 12th grade  Occupational History   Occupation: retired    Fish farm manager: RETIRED  Tobacco Use   Smoking status: Former   Smokeless tobacco: Never   Tobacco comments:    quit in [place] Use   Vaping Use: Never used  Substance and Sexual Activity   Alcohol use: No    Alcohol/week: 0.0 standard drinks   Drug use: No   Sexual activity: Not Currently  Other Topics Concern   Not on file  Social History Narrative   Lives alone in a [place] home.  Husband passed away in April 26, 1999.   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   1 son, not local.     Enjoys shopping   Still drives, uses a cane.     Social Determinants of Health   Financial Resource Strain: Not on file  Food Insecurity: Not on file  Transportation Needs: Not on file  Physical Activity: Not on file  Stress: Not on file  Social Connections: Not on file   Family History  Problem Relation Age of Onset   Arthritis Mother    Stroke Mother    Stroke Father    Arthritis-Osteo Son    Scheduled Meds:  amLODipine  10 mg Oral Daily   apixaban  5 mg Oral BID   insulin aspart  0-9 Units Subcutaneous TID WC   lisinopril  40 mg Oral Daily   metoprolol succinate  12.5 mg Oral Daily   polyethylene glycol  17 g Oral BID   senna-docusate  1 tablet Oral BID   Continuous Infusions: PRN Meds:.acetaminophen **OR** acetaminophen, albuterol, bisacodyl, hydrALAZINE,  ondansetron **OR** ondansetron (ZOFRAN) IV, polyvinyl alcohol Medications Prior to Admission:  Prior to Admission medications   Medication Sig Start Date End Date Taking? Authorizing Provider  Amino Acids-Protein Hydrolys (FEEDING SUPPLEMENT, PRO-STAT SUGAR FREE 64,) LIQD Take 30 mLs by mouth 2 (two) times daily. Wound healing Patient not taking: No sig reported  [provider]  diclofenac Sodium (VOLTAREN) 1 % GEL Apply 2 g topically 4 (four) times daily. As needed for knee pain. Patient not taking: No sig reported 06/15/19   Elby Beck, FNP  hydrALAZINE (APRESOLINE) 25 MG tablet TAKE ONE TABLET BY MOUTH EVERY 8 HOURS Patient not taking: No sig reported 06/03/19   Elby Beck, FNP  rOPINIRole (REQUIP) 0.25 MG tablet Take 1 tablet (0.25 mg total) by mouth at bedtime. Patient not taking: Reported on 09/25/2020 04/15/19   Elby Beck, FNP   Allergies  Allergen Reactions   Gabapentin Other (See Comments) and Itching    Made the legs "go numb" Made the legs "go numb"   Other Other (See Comments)    "MD mixed a blood pressure medication with the Clinoril I was taking and I couldn't walk" Other reaction(s): Other (See Comments) "MD mixed a blood pressure medication with the Clinoril I was taking and I couldn't walk" "Made me achy and sick"   Penicillins Other (See Comments) and Itching    Has patient had a PCN reaction causing immediate rash, facial/tongue/throat swelling, SOB or lightheadedness with hypotension: Unk- exact reaction not recalled, but is allergic Has patient had a PCN reaction causing severe rash involving mucus membranes or skin necrosis: Unk Has patient had a PCN reaction that required hospitalization: Unk Has patient had a PCN reaction occurring within the last 10 years: Yes If all of the above answers are "NO", then may proceed with Cephalosporin use.  Itching and rash   Pregabalin Other (See Comments)    "Made me pass out" Other reaction(s):  Other (See Comments) Made limbs go numb   Vit D-Vit E-Safflower Oil Other (See Comments)    "Made me achy and sick"   Review of Systems  Constitutional:  Positive for fatigue.  Musculoskeletal:        Leg weakness   Physical Exam General: Alert, awake, in no acute distress.   HEENT: No bruits, no goiter, no JVD Heart: Regular rate and rhythm. No murmur appreciated. Lungs: Good air movement, clear Abdomen: Soft, nontender, nondistended, positive bowel sounds.   Ext: No significant edema Skin: Warm and dry Neuro: Grossly intact, nonfocal.   Vital Signs: BP (!) 142/88 (BP Location: Right Arm)   Pulse 62   Temp 98.4 F (36.9 C) (Oral)   Resp 18   Ht 5' 3"  (1.6 m)   Wt 65.7 kg   SpO2 99%   BMI 25.66 kg/m  Pain Scale: 0-10   Pain Score: 0-No pain   SpO2: SpO2: 99 % O2 Device:SpO2: 99 % O2 Flow Rate: .   IO: Intake/output summary:  Intake/Output Summary (Last 24 hours) at 10/10/2020 4332 Last data filed at 10/10/2020 0400 Gross per 24 hour  Intake --  Output 1000 ml  Net -1000 ml    LBM: Last BM Date: 10/09/20 Baseline Weight: Weight: 68 kg Most recent weight: Weight: 65.7 kg     Palliative Assessment/Data:   Flowsheet Rows    Flowsheet Row Most Recent Value  Intake Tab   Referral Department Hospitalist  Unit at Time of Referral Med/Surg Unit  Palliative Care Primary Diagnosis Neurology  Date Notified 10/05/20  Palliative Care Type New Palliative care  Reason for referral Clarify Goals of Care  Date of Admission 09/25/20  Date first seen by Palliative Care 10/09/20  # of days Palliative referral response time 4 Day(s)  # of days IP prior to Palliative referral 10  Clinical Assessment  Palliative Performance Scale Score 50%  Psychosocial & Spiritual Assessment   Palliative Care Outcomes   Patient/Family meeting held? Yes  Who was at the meeting? patient       Time Total: 55 Greater than 50%  of this time was spent counseling and coordinating care  related to the above assessment and plan.  Signed by: Micheline Rough, MD   Please contact Palliative Medicine Team phone at [phone] for questions and concerns.  For individual provider: See Shea Evans

## 2020-10-10 NOTE — TOC Progression Note (Signed)
Transition of Care [facility]) - Progression Note    Patient Details  Name: Kristina Wilkinson MRN: [id number] Date of Birth: 1927/09/04  [facility] CM/SW Contact  Ross Ludwig, [place] Phone Number: 10/10/2020, 9:27 AM  Clinical Narrative:     CSW spoke to [facility], they have a bed available tomorrow CSW updated attending physician.   Expected Discharge Plan: [place] Barriers to Discharge: Continued Medical Work up  Expected Discharge Plan and Services Expected Discharge Plan: [place] arrangements for the past 2 months: Single Family Home Expected Discharge Date: 10/10/20                                     Social Determinants of Health (SDOH) Interventions    Readmission Risk Interventions No flowsheet data found.

## 2020-10-11 DIAGNOSIS — Z7901 Long term (current) use of anticoagulants: Secondary | ICD-10-CM | POA: Diagnosis not present

## 2020-10-11 DIAGNOSIS — I10 Essential (primary) hypertension: Secondary | ICD-10-CM | POA: Diagnosis not present

## 2020-10-11 DIAGNOSIS — H9190 Unspecified hearing loss, unspecified ear: Secondary | ICD-10-CM | POA: Diagnosis not present

## 2020-10-11 DIAGNOSIS — I959 Hypotension, unspecified: Secondary | ICD-10-CM | POA: Diagnosis not present

## 2020-10-11 DIAGNOSIS — H04123 Dry eye syndrome of bilateral lacrimal glands: Secondary | ICD-10-CM | POA: Diagnosis not present

## 2020-10-11 DIAGNOSIS — R404 Transient alteration of awareness: Secondary | ICD-10-CM | POA: Diagnosis not present

## 2020-10-11 DIAGNOSIS — R7401 Elevation of levels of liver transaminase levels: Secondary | ICD-10-CM | POA: Diagnosis not present

## 2020-10-11 DIAGNOSIS — K5900 Constipation, unspecified: Secondary | ICD-10-CM | POA: Diagnosis not present

## 2020-10-11 DIAGNOSIS — R609 Edema, unspecified: Secondary | ICD-10-CM | POA: Diagnosis not present

## 2020-10-11 DIAGNOSIS — M4800 Spinal stenosis, site unspecified: Secondary | ICD-10-CM | POA: Diagnosis not present

## 2020-10-11 DIAGNOSIS — Z87891 Personal history of nicotine dependence: Secondary | ICD-10-CM | POA: Diagnosis not present

## 2020-10-11 DIAGNOSIS — R001 Bradycardia, unspecified: Secondary | ICD-10-CM | POA: Diagnosis not present

## 2020-10-11 DIAGNOSIS — R4182 Altered mental status, unspecified: Secondary | ICD-10-CM | POA: Diagnosis not present

## 2020-10-11 DIAGNOSIS — M1990 Unspecified osteoarthritis, unspecified site: Secondary | ICD-10-CM | POA: Diagnosis not present

## 2020-10-11 DIAGNOSIS — Z7401 Bed confinement status: Secondary | ICD-10-CM | POA: Diagnosis not present

## 2020-10-11 DIAGNOSIS — E1140 Type 2 diabetes mellitus with diabetic neuropathy, unspecified: Secondary | ICD-10-CM | POA: Diagnosis not present

## 2020-10-11 DIAGNOSIS — F0390 Unspecified dementia without behavioral disturbance: Secondary | ICD-10-CM | POA: Diagnosis not present

## 2020-10-11 DIAGNOSIS — I480 Paroxysmal atrial fibrillation: Secondary | ICD-10-CM | POA: Diagnosis not present

## 2020-10-11 DIAGNOSIS — I4891 Unspecified atrial fibrillation: Secondary | ICD-10-CM | POA: Diagnosis not present

## 2020-10-11 DIAGNOSIS — Z86718 Personal history of other venous thrombosis and embolism: Secondary | ICD-10-CM | POA: Diagnosis not present

## 2020-10-11 DIAGNOSIS — G2581 Restless legs syndrome: Secondary | ICD-10-CM | POA: Diagnosis not present

## 2020-10-11 DIAGNOSIS — E118 Type 2 diabetes mellitus with unspecified complications: Secondary | ICD-10-CM | POA: Diagnosis not present

## 2020-10-11 DIAGNOSIS — F32A Depression, unspecified: Secondary | ICD-10-CM | POA: Diagnosis not present

## 2020-10-11 DIAGNOSIS — R296 Repeated falls: Secondary | ICD-10-CM | POA: Diagnosis not present

## 2020-10-11 DIAGNOSIS — K219 Gastro-esophageal reflux disease without esophagitis: Secondary | ICD-10-CM | POA: Diagnosis not present

## 2020-10-11 DIAGNOSIS — Z9181 History of falling: Secondary | ICD-10-CM | POA: Diagnosis not present

## 2020-10-11 DIAGNOSIS — G4700 Insomnia, unspecified: Secondary | ICD-10-CM | POA: Diagnosis not present

## 2020-10-11 DIAGNOSIS — Z96652 Presence of left artificial knee joint: Secondary | ICD-10-CM | POA: Diagnosis not present

## 2020-10-11 LAB — GLUCOSE, CAPILLARY: Glucose-Capillary: 90 mg/dL (ref 70–99)

## 2020-10-11 NOTE — Progress Notes (Signed)
Occupational Therapy Treatment Patient Details Name: Kristina Wilkinson MRN: [id number] DOB: 1927/11/15 Today's Date: 10/11/2020    History of present illness Pt admitted from home 2* multiple falls, weakness and c/o L knee, L shoulder and hip pain - on imaging no acute findings. Pt found to have New onset A. fib with RVR and hypertensive urgency. Pt with hx of DM, LBP, L TKR, cerebral aneurysm repair, peripheral neuropathy, and multiple falls -"I call the fire department to get me up"   OT comments  Patient planned to d/c to SNF today. Patient was educated on correcting posture in standing with patient able to minimally follow education. Patient was mod A for transfers with posterior leaning noted upon sit to stand and reduces with movement. Patient continues to be increasingly fearful of falling. Patient's discharge plan remains appropriate at this time. OT will continue to follow acutely.    Follow Up Recommendations  SNF    Equipment Recommendations  None recommended by OT    Recommendations for Other Services      Precautions / Restrictions Precautions Precautions: Fall Precaution Comments: multiple falls at home, [facility] Restrictions Weight Bearing Restrictions: No       Mobility Bed Mobility Overal bed mobility: Needs Assistance Bed Mobility: Supine to Sit;Sit to Supine     Supine to sit: Min assist Sit to supine: Supervision   General bed mobility comments: assist for upper body, increased time and effort    Transfers                      Balance Overall balance assessment: Needs assistance;History of Falls Sitting-balance support: Feet supported Sitting balance-Leahy Scale: Fair   Postural control: Posterior lean Standing balance support: Bilateral upper extremity supported Standing balance-Leahy Scale: Poor Standing balance comment: needing mod A and reliant on walker with posterior leaning noted.                           ADL either performed  or assessed with clinical judgement   ADL Overall ADL's : Needs assistance/impaired                         Toilet Transfer: Moderate assistance;Stand-pivot;Cueing for sequencing;Cueing for safety;RW Toilet Transfer Details (indicate cue type and reason): patient was noted to have posterior leaning when attempting to stand up with inability to follow cues to correct issue. patient completed transfer x 4 with RW with same assist and inability to follow cues. patient is able to hear cues as patient repeated education back to therapist but was unable to applyy it. Toileting- Clothing Manipulation and Hygiene: Maximal assistance Toileting - Clothing Manipulation Details (indicate cue type and reason): patient attempted to assist with hygiene tasks while seated on commode with weight shifting but reported it hurt her hand too much and stopped.     Functional mobility during ADLs: Moderate assistance;Cueing for safety;Cueing for sequencing;Rolling walker       Vision       Perception     Praxis      Cognition Arousal/Alertness: Awake/alert Behavior During Therapy: WFL for tasks assessed/performed Overall Cognitive Status: Within Functional Limits for tasks assessed                                 General Comments: very [facility]        Exercises  Shoulder Instructions       General Comments      Pertinent Vitals/ Pain       Pain Assessment: No/denies pain  Home Living                                          Prior Functioning/Environment              Frequency  Min 2X/week        Progress Toward Goals  OT Goals(current goals can now be found in the care plan section)  Progress towards OT goals: Progressing toward goals  Acute Rehab OT Goals Patient Stated Goal: to get back home  Plan Discharge plan remains appropriate    Co-evaluation                 AM-PAC OT "6 Clicks" Daily Activity     Outcome  Measure   Help from another person eating meals?: None Help from another person taking care of personal grooming?: A Little Help from another person toileting, which includes using toliet, bedpan, or urinal?: A Lot Help from another person bathing (including washing, rinsing, drying)?: A Lot Help from another person to put on and taking off regular upper body clothing?: A Little Help from another person to put on and taking off regular lower body clothing?: A Lot 6 Click Score: 16    End of Session Equipment Utilized During Treatment: Rolling walker;Gait belt  OT Visit Diagnosis: Muscle weakness (generalized) (M62.81);History of falling (Z91.81)   Activity Tolerance Patient tolerated treatment well   Patient Left in bed;with call bell/phone within reach   Nurse Communication Mobility status        Time: 0920-0939 OT Time Calculation (min): 19 min  Charges: OT General Charges $OT Visit: 1 Visit OT Treatments $Self Care/Home Management : 8-22 mins  Jackelyn Poling OTR/L, MS Acute Rehabilitation Department Office# [phone] Pager# [phone]    [place] 10/11/2020, 9:52 AM

## 2020-10-11 NOTE — Progress Notes (Signed)
Report called to [facility], [place].  Transported by EMS. Andre Lefort

## 2020-10-11 NOTE — TOC Transition Note (Addendum)
Transition of Care [facility]) - CM/SW Discharge Note   Patient Details  Name: DUANNA SANDVIG MRN: [id number] Date of Birth: [birth year]  [facility] CM/SW Contact:  Ross Ludwig, LCSW Phone Number: 10/11/2020, 9:22 AM   Clinical Narrative:    CSW spoke to [facility], patient can go to SNF today.  Patient to be d/c'ed today to [facility].  Patient and family agreeable to plans will transport via ems RN to call report [phone].  CSW spoke to patient's nephew Vevelyn Royals, and he is aware that patient is dcing today.  He is agreeable to having patient discharge via EMS.  Palliative recommended palliative follow patient at SNF.  [facility] was given the referral, they are aware that patient is dcing today.     Final next level of care: Skilled Nursing Facility Barriers to Discharge: Barriers Resolved   Patient Goals and CMS Choice Patient states their goals for this hospitalization and ongoing recovery are:: To go to SNF for short term rehab. CMS Medicare.gov Compare Post Acute Care list provided to:: Patient Represenative (must comment) Choice offered to / list presented to : [place] / Guardian  Discharge Placement PASRR number recieved: 10/06/20            Patient chooses bed at: [facility] Patient to be transferred to facility by: [facility]   Patient and family notified of of transfer: 10/11/20  Discharge Plan and Services  Patient will be discharging to [facility] today.                                   Social Determinants of Health (SDOH) Interventions     Readmission Risk Interventions No flowsheet data found.

## 2020-10-11 NOTE — Progress Notes (Signed)
[facility] ([facility])  Hospital Liaison RN note         Notified by TOC manager of patient/family request for [facility] Palliative services at home after discharge.              [facility] Palliative team will follow up with patient after discharge.         Please call with any hospice or palliative related questions.         Thank you for the opportunity to participate in this patient's care.     Chrislyn King, BSN, RN [facility] Hospital Liaison (listed on [facility] under Hospice/[facility])    [phone] (24h on call)    

## 2020-10-11 NOTE — Discharge Summary (Signed)
Physician Discharge Summary  Kristina Wilkinson [id number] DOB: 1927-03-27 DOA: 09/25/2020  PCP: Pcp, No  Admit date: 09/25/2020 Discharge date: 10/11/2020  Admitted From: Home Disposition: SNF  Recommendations for Outpatient Follow-up:  Follow up with PCP in 1-2 weeks; Will need to establish with a New PCP Outpatient Palliative Follow at SNF Please obtain CMP/CBC, Mag, Phos in one week Please follow up on the following pending results:  Home Health: No Equipment/Devices: None  Discharge Condition: Stable CODE STATUS: FULL CODE Diet recommendation: Heart Healthy Carb Modified Diet  Brief/Interim Summary: The patient is a 87 year old elderly female with a past medical history significant for but not limited to diabetes mellitus type 2, hypertension, debility, recurrent falls, history of CNS aneurysm status post clipping, history of DVT back in May 2021 status post Eliquis for 3 months and other comorbidities.  Patient lives alone and she presented to the ED from home with a chief complaint of progressive generalized weakness leading to a fall in both her knees there is bruising and left-sided rib pain.  In the ED she was noted to be in A. fib with RVR she was started on a Cardizem drip and admitted to the hospitalist service.  Subsequently after being placed on a Cardizem drip she converted to sinus rhythm and subsequently changed to oral metoprolol.  Her CHA2DS2-VASc score is at least 6 and she is now on long-term anticoagulation with Eliquis.  Her blood pressure was elevated and is now fairly well controlled with metoprolol, lisinopril and amlodipine.  Urinalysis showed large amount leukocytes plus nitrates and she was treated with dose of fosfomycin. Given her recurrent falls and generalized weakness PT OT recommended SNF but she is very adamant in going home.  She does not have the medical capacity decision-making to refuse SNF at this time and will need SNF placement and she wants to go to  [facility] in [place].  She is medically stable to be discharged at this time.  ADDENDUM 10/11/20: She was deemed medically stable to be D/C'd however did not actually leave because [facility] did not have a bed available until today. She remains stable to D/C and had no acute events overnight.   Discharge Diagnoses:  Principal Problem:   New onset a-fib [facility]) Active Problems:   HTN (hypertension)   Diabetes mellitus without complication (HCC)   Abnormal urinalysis   Leukocytosis   Falls  New onset A. fib with RVR -Initially placed on Cardizem drip.  After conversion to sinus rhythm, patient was switched to oral metoprolol.   -Currently remains in NSR -Currently stable on Toprol at 12.5 mg daily.  Continue to monitor in telemetry. -CHADSVASC score at least 6 putting her at high risk of stroke.  Per history, she tolerated a 54-monthcourse of Eliquis in May 2021 for DVT.   -Patient agreed to long-term anticoagulation at this time because of A. fib. Eliquis was started.   Hypertensive urgency -Blood pressure fairly controlled for her age.   -Continue Toprol 12.5 mg daily, Lisinopril 40 mg daily and Amlodipine 10 mg daily.   -Continue to monitor BP per Protocol -Last BP was 147/61   Diabetes Mellitus Type 2 -Well-controlled, A1c 6.1.  Continue SSI. -CBG's ranging from 90-152   Normocytic Anemia -Patient's Hgb/Hct went from 9.6/30.3 -> 11.4/35.7 -> 11.8/37.9 -> 11.1/33.5 on last check -Check Anemia Panel as an outpatient  -Continue to Monitor for S/Sx of Bleeding; No overt bleeding noted -Repeat CBC in the AM    UTI -Urinalysis  showed large amount of leukocytes, positive nitrates and many bacteria.   -Treated with a dose of Fosfomycin.   Constipation, improved -Had a difficult time and started straining yesterday -Started Bowel Regimen with Miralax 17 gram po BID, Senna-Docusate 1 tab po BID and Bisacodyl 10 mg RC -Improved and nursing states she had a good bowel  movement yesterday   Generalized Weakness/Recurrent Falls -PT OT recommends SNF.  Patient very adamant on going home.  She does not even want home health.  Patient 87 year old and lives alone.  Has a son who lives in [place] and himself has many medical problems including recent stroke and has dysarthria and cannot take care of himself.  Dr. Doristine Wilkinson spent long time trying to convince her and counselor however she is very adamant on her decision.  Eventually Dr. Doristine Wilkinson asked her if he could talk to her family member and she granted permission to talk to her son Kristina Wilkinson.  I spoke to him however he also had stroke recently and he has significant dysarthria and he is unable to care for himself and he referred me to talk to patient's best friend, who is also her nephew Kristina Wilkinson, however he mentioned that we should do what is best for the patient.  Dr. 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  Dr. Doristine Wilkinson informed him about recommendations for SNF.  He agreed with that.  He also was on the same page that patient should not go home as she is not doing well.  Dr. Doristine Wilkinson requested him to come talk to the patient and convince her to go to rehab.  So he did and patient was agreeable to go to SNF on Sunday but then once again she reiterated that she only wants to go home.  She does not have has mental capacity to make the right decision for herself and this was confirmed by Psych Consultation -PT and OT constantly are recommending SNF for [facility].  TOC working on finding a bed and patient now requesting [place] consulted for [place] [facility] and they will follow her at [facility]   Discharge Instructions  Discharge Instructions     Amb referral to [facility]   Complete by: As directed    Call MD for:  difficulty breathing, headache or visual disturbances   Complete by:  As directed    Call MD for:  extreme fatigue   Complete by: As directed    Call MD for:  hives   Complete by: As directed    Call MD for:  persistant dizziness or light-headedness   Complete by: As directed    Call MD for:  persistant nausea and vomiting   Complete by: As directed    Call MD for:  redness, tenderness, or signs of infection (pain, swelling, redness, odor or green/yellow discharge around incision site)   Complete by: As directed    Call MD for:  severe uncontrolled pain   Complete by: As directed    Call MD for:  temperature >100.4   Complete by: As directed    Diet - low sodium heart healthy   Complete by: As directed    Discharge instructions   Complete by: As directed    You were cared for by a hospitalist during your hospital stay. If you have any questions about your  discharge medications or the care you received while you were in the hospital after you are discharged, you can call the unit and ask to speak with the hospitalist on call if the hospitalist that took care of you is not available. Once you are discharged, your primary care physician will handle any further medical issues. Please note that NO REFILLS for any discharge medications will be authorized once you are discharged, as it is imperative that you return to your primary care physician (or establish a relationship with a primary care physician if you do not have one) for your aftercare needs so that they can reassess your need for medications and monitor your lab values.  Follow up with PCP, Cardiology, and Palliative Care Medicine as an outpaitent. Take all medications as prescribed. If symptoms change or worsen please return to the ED for evaluation   Discharge wound care:   Complete by: As directed    Keep MASD clean and Dry   Increase activity slowly   Complete by: As directed       Allergies as of 10/11/2020       Reactions   Gabapentin Other (See Comments), Itching   Made the legs "go  numb" Made the legs "go numb"   Other Other (See Comments)   "MD mixed a blood pressure medication with the Clinoril I was taking and I couldn't walk" Other reaction(s): Other (See Comments) "MD mixed a blood pressure medication with the Clinoril I was taking and I couldn't walk" "Made me achy and sick"   Penicillins Other (See Comments), Itching   Has patient had a PCN reaction causing immediate rash, facial/tongue/throat swelling, SOB or lightheadedness with hypotension: Unk- exact reaction not recalled, but is allergic Has patient had a PCN reaction causing severe rash involving mucus membranes or skin necrosis: Unk Has patient had a PCN reaction that required hospitalization: Unk Has patient had a PCN reaction occurring within the last 10 years: Yes If all of the above answers are "NO", then may proceed with Cephalosporin use. Itching and rash   Pregabalin Other (See Comments)   "Made me pass out" Other reaction(s): Other (See Comments) Made limbs go numb   Vit D-vit E-safflower Oil Other (See Comments)   "Made me achy and sick"        Medication List     STOP taking these medications    diclofenac Sodium 1 % Gel Commonly known as: VOLTAREN   feeding supplement (PRO-STAT SUGAR FREE 64) Liqd   hydrALAZINE 25 MG tablet Commonly known as: APRESOLINE   rOPINIRole 0.25 MG tablet Commonly known as: REQUIP       TAKE these medications    acetaminophen 325 MG tablet Commonly known as: TYLENOL Take 2 tablets (650 mg total) by mouth every 6 (six) hours as needed for mild pain (or Fever >/= 101).   albuterol (2.5 MG/3ML) 0.083% nebulizer solution Commonly known as: PROVENTIL Take 3 mLs (2.5 mg total) by nebulization every 4 (four) hours as needed for wheezing or shortness of breath.   amLODipine 10 MG tablet Commonly known as: NORVASC Take 1 tablet (10 mg total) by mouth daily.   apixaban 5 MG Tabs tablet Commonly known as: ELIQUIS Take 1 tablet (5 mg total) by  mouth 2 (two) times daily.   bisacodyl 10 MG suppository Commonly known as: DULCOLAX Place 1 suppository (10 mg total) rectally daily as needed for moderate constipation.   lisinopril 40 MG tablet Commonly known as: ZESTRIL Take 1 tablet (  40 mg total) by mouth daily.   metoprolol succinate 25 MG 24 hr tablet Commonly known as: TOPROL-XL Take 0.5 tablets (12.5 mg total) by mouth daily.   ondansetron 4 MG tablet Commonly known as: ZOFRAN Take 1 tablet (4 mg total) by mouth every 6 (six) hours as needed for nausea.   polyethylene glycol 17 g packet Commonly known as: MIRALAX / GLYCOLAX Take 17 g by mouth 2 (two) times daily.   polyvinyl alcohol 1.4 % ophthalmic solution Commonly known as: LIQUIFILM TEARS Place 1 drop into both eyes as needed for dry eyes.   senna-docusate 8.6-50 MG tablet Commonly known as: Senokot-S Take 1 tablet by mouth 2 (two) times daily.               Discharge Care Instructions  (From admission, onward)           Start     Ordered   10/10/20 0000  Discharge wound care:       Comments: Keep MASD clean and Dry   10/10/20 0830            Allergies  Allergen Reactions   Gabapentin Other (See Comments) and Itching    Made the legs "go numb" Made the legs "go numb"   Other Other (See Comments)    "MD mixed a blood pressure medication with the Clinoril I was taking and I couldn't walk" Other reaction(s): Other (See Comments) "MD mixed a blood pressure medication with the Clinoril I was taking and I couldn't walk" "Made me achy and sick"   Penicillins Other (See Comments) and Itching    Has patient had a PCN reaction causing immediate rash, facial/tongue/throat swelling, SOB or lightheadedness with hypotension: Unk- exact reaction not recalled, but is allergic Has patient had a PCN reaction causing severe rash involving mucus membranes or skin necrosis: Unk Has patient had a PCN reaction that required hospitalization: Unk Has patient  had a PCN reaction occurring within the last 10 years: Yes If all of the above answers are "NO", then may proceed with Cephalosporin use.  Itching and rash   Pregabalin Other (See Comments)    "Made me pass out" Other reaction(s): Other (See Comments) Made limbs go numb   Vit D-Vit E-Safflower Oil Other (See Comments)    "Made me achy and sick"   Consultations: Palliative Care Medicine Psychiatry   Procedures/Studies: CT HEAD WO CONTRAST (5MM)  Result Date: 09/25/2020 CLINICAL DATA:  Fall.  Facial trauma EXAM: CT HEAD WITHOUT CONTRAST TECHNIQUE: Contiguous axial images were obtained from the base of the skull through the vertex without intravenous contrast. COMPARISON:  None. FINDINGS: Brain: No acute intracranial hemorrhage. No focal mass lesion. No CT evidence of acute infarction. No midline shift or mass effect. No hydrocephalus. Basilar cisterns are patent. Ossific fragment is seen in lower brainstem and LEFT occipital lobe not changed from prior presumed related to prior trauma. There are periventricular and subcortical white matter hypodensities. Generalized cortical atrophy. Vascular: No hyperdense vessel or unexpected calcification. Skull: Prior surgery in the occipital bone midline. Sinuses/Orbits: Paranasal sinuses and mastoid air cells are clear. Orbits are clear. Other: None. IMPRESSION: 1. No intracranial trauma. 2. RIGHT trauma in the posterior fossa. 3. Atrophy and white matter microvascular disease Electronically Signed   By: Suzy Bouchard M.D.   On: 09/25/2020 05:05   DG Shoulder Left  Result Date: 09/25/2020 CLINICAL DATA:  87 year old female status post fall with pain. EXAM: LEFT SHOULDER - 2+ VIEW COMPARISON:  Left  shoulder series 11/30/2018. FINDINGS: Three portable views at 0354 hours. Bone mineralization is stable since 2020, within normal limits for age. No glenohumeral joint dislocation. Intact proximal right humerus. Right clavicle and scapula appear intact.  Moderate chronic left AC joint degenerative spurring. Negative visible left ribs and chest. Tortuous thoracic aorta with calcified atherosclerosis. IMPRESSION: No acute fracture or dislocation identified about the left shoulder. Aortic Atherosclerosis (ICD10-I70.0). Electronically Signed   By: Genevie Ann M.D.   On: 09/25/2020 04:10   DG Knee Complete 4 Views Left  Result Date: 09/25/2020 CLINICAL DATA:  87 year old female status post fall with pain. EXAM: LEFT KNEE - COMPLETE 4+ VIEW COMPARISON:  Left knee series 11/30/2018. FINDINGS: Chronic medial hemiarthroplasty. Hardware appears stable and intact. No joint effusion on the cross-table lateral view. Superimposed chronic severe patellofemoral and lateral compartment degeneration. Joint space loss and degenerative spurring. No acute osseous abnormality identified. No discrete soft tissue injury. IMPRESSION: 1. No acute fracture or dislocation identified about the left knee. 2. Chronic medial compartment hemiarthroplasty with advanced lateral and patellofemoral compartment degeneration. Electronically Signed   By: Genevie Ann M.D.   On: 09/25/2020 04:14   ECHOCARDIOGRAM COMPLETE  Result Date: 09/26/2020    ECHOCARDIOGRAM REPORT   Patient Name:   LAGUISHA SQUIER Date of Exam: 09/26/2020 Medical Rec #:  [id number]      Height:       63.0 in Accession #:    [id number]     Weight:       150.0 lb Date of Birth:  Aug 09, 1927       BSA:          54.711 m Patient Age:    55 years       BP:           177/54 mmHg Patient Gender: F              HR:           61 bpm. Exam Location:  Inpatient Procedure: 2D Echo, 3D Echo, Cardiac Doppler and Color Doppler Indications:    R07.9* Chest pain, unspecified  History:        Patient has prior history of Echocardiogram examinations, most                 recent 07/14/2016. Abnormal ECG, Arrythmias:Atrial Fibrillation                 and Bradycardia; Risk Factors:Hypertension, Former Smoker and                 Diabetes.  Sonographer:    Roseanna Rainbow RDCS Referring Phys: [id number] [facility] THOM[facility]  Sonographer Comments: Technically difficult study due to poor echo windows. Image transfer delay, no syngo access in ED. IMPRESSIONS  1. The mitral valve is abnormal with prominent mitral annular calcification. Trivial mitral valve regurgitation. Mild to moderate mitral stenosis. Mean gradient of 3 mm Hg at heart rate of 64 but MVA by continuity equation ~ 1.5 cm2.  2. Left ventricular ejection fraction, by estimation, is 65%. Left ventricular ejection fraction by 3D volume is 61 %. The left ventricle has normal function. The left ventricle has no regional wall motion abnormalities. Left ventricular diastolic parameters are indeterminate.  3. Right ventricular systolic function is normal. The right ventricular size is normal. There is mildly elevated pulmonary artery systolic pressure. The estimated right ventricular systolic pressure is [id number] mmHg.  4. The aortic valve is tricuspid. There is moderate calcification of the  aortic valve. Aortic valve regurgitation is not visualized.  5. The inferior vena cava is dilated in size with <50% respiratory variability, suggesting right atrial pressure of 15 mmHg. Comparison(s): A prior study was performed on 07/14/2016. Prior images reviewed side by side. Increase in mitral valve gradients. FINDINGS  Left Ventricle: Left ventricular ejection fraction, by estimation, is 65%. Left ventricular ejection fraction by 3D volume is 61 %. The left ventricle has normal function. The left ventricle has no regional wall motion abnormalities. The left ventricular internal cavity size was normal in size. There is no left ventricular hypertrophy. Left ventricular diastolic parameters are indeterminate. Right Ventricle: The right ventricular size is normal. No increase in right ventricular wall thickness. Right ventricular systolic function is normal. There is mildly elevated pulmonary artery systolic pressure. The tricuspid regurgitant  velocity is 2.59  m/s, and with an assumed right atrial pressure of 15 mmHg, the estimated right ventricular systolic pressure is [id number] mmHg. Left Atrium: Left atrial size was normal in size. Right Atrium: Right atrial size was normal in size. Pericardium: There is no evidence of pericardial effusion. Mitral Valve: The mitral valve is abnormal. Moderate mitral annular calcification. Trivial mitral valve regurgitation. Mild to moderate mitral valve stenosis. MV peak gradient, 9.0 mmHg. The mean mitral valve gradient is 3.0 mmHg with average heart rate of 64 bpm. Tricuspid Valve: The tricuspid valve is normal in structure. Tricuspid valve regurgitation is trivial. Aortic Valve: The aortic valve is tricuspid. There is moderate calcification of the aortic valve. There is mild aortic valve annular calcification. Aortic valve regurgitation is not visualized. Aortic valve mean gradient measures 5.0 mmHg. Aortic valve peak gradient measures 9.4 mmHg. Aortic valve area, by VTI measures 1.73 cm. Pulmonic Valve: The pulmonic valve was grossly normal. Pulmonic valve regurgitation is not visualized. No evidence of pulmonic stenosis. Aorta: The aortic root and ascending aorta are structurally normal, with no evidence of dilitation. Venous: The inferior vena cava is dilated in size with less than 50% respiratory variability, suggesting right atrial pressure of 15 mmHg. IAS/Shunts: The atrial septum is grossly normal.  LEFT VENTRICLE PLAX 2D LVIDd:         4.60 cm LVIDs:         2.70 cm LV PW:         1.00 cm         3D Volume EF LV IVS:        1.00 cm         LV 3D EF:    Left LVOT diam:     1.80 cm                      ventricul LV SV:         73                           ar LV SV Index:   43                           ejection LVOT Area:     2.54 cm                     fraction  by 3D                                             volume is LV Volumes (MOD)                            61 %.  LV vol d, MOD    58.0 ml A2C: LV vol d, MOD    48.0 ml       3D Volume EF: A4C:                           3D EF:        61 % LV vol s, MOD    16.5 ml       LV EDV:       104 ml A2C:                           LV ESV:       41 ml LV vol s, MOD    16.5 ml       LV SV:        63 ml A4C: LV SV MOD A2C:   41.5 ml LV SV MOD A4C:   48.0 ml LV SV MOD BP:    38.0 ml RIGHT VENTRICLE            IVC RV S prime:     8.16 cm/s  IVC diam: 2.40 cm TAPSE (M-mode): 1.7 cm LEFT ATRIUM             Index       RIGHT ATRIUM           Index LA diam:        4.40 cm 2.57 cm/m  RA Area:     12.40 cm LA Vol (A2C):   52.3 ml 30.56 ml/m RA Volume:   26.50 ml  15.49 ml/m LA Vol (A4C):   50.2 ml 29.34 ml/m LA Biplane Vol: 56.5 ml 33.02 ml/m  AORTIC VALVE AV Area (Vmax):    2.01 cm AV Area (Vmean):   1.95 cm AV Area (VTI):     1.73 cm AV Vmax:           153.00 cm/s AV Vmean:          107.000 cm/s AV VTI:            0.421 m AV Peak Grad:      9.4 mmHg AV Mean Grad:      5.0 mmHg LVOT Vmax:         121.00 cm/s LVOT Vmean:        82.200 cm/s LVOT VTI:          0.286 m LVOT/AV VTI ratio: 0.68  AORTA Ao Root diam: 2.60 cm Ao Asc diam:  3.50 cm MITRAL VALVE                TRICUSPID VALVE MV Area (PHT): 2.98 cm     TR Peak grad:   26.8 mmHg MV Area VTI:   1.30 cm     TR Vmax:        259.00 cm/s MV Peak grad:  9.0 mmHg MV Mean grad:  3.0 mmHg     SHUNTS MV  Vmax:       1.50 m/s     Systemic VTI:  0.29 m MV Vmean:      72.6 cm/s    Systemic Diam: 1.80 cm MV Decel Time: 255 msec MV E velocity: 138.33 cm/s MV A velocity: 133.00 cm/s MV E/A ratio:  1.04 Rudean Haskell MD Electronically signed by Rudean Haskell MD Signature Date/Time: 09/26/2020/11:03:51 AM    Final    DG Hip Unilat W or Wo Pelvis 2-3 Views Left  Result Date: 09/25/2020 CLINICAL DATA:  87 year old female status post fall with pain. EXAM: DG HIP (WITH OR WITHOUT PELVIS) 2-3V LEFT COMPARISON:  CT Abdomen and Pelvis 03/05/2019. Pelvis radiograph 11/30/2018. FINDINGS:  Osteopenia. Femoral heads remain normally located. Asymmetric enthesophyte ptosis suspected along the caudal right hip joint. Right gluteal dystrophic calcifications on the prior CT now project over the right femoral neck. Pelvis appears stable and intact. Grossly intact proximal right femur. Proximal left femur appears intact. SI joints appear symmetric. No acute osseous abnormality identified. Negative visible bowel gas pattern. IMPRESSION: No acute fracture or dislocation identified about the left hip or pelvis. Electronically Signed   By: Genevie Ann M.D.   On: 09/25/2020 04:13     Subjective: Seen and examined at bedside and she is doing well.  She denies any chest pain or shortness of breath.  No lightheadedness or dizziness.  No nausea or vomiting.  No other concerns or complaints at this time.  Discharge Exam: Vitals:   10/10/20 2114 10/11/20 0448  BP: (!) 133/59 (!) 147/61  Pulse: 63 61  Resp: 18 18  Temp: 98 F (36.7 C) 98 F (36.7 C)  SpO2: 97% 96%   Vitals:   10/10/20 0441 10/10/20 1255 10/10/20 2114 10/11/20 0448  BP:  (!) 120/50 (!) 133/59 (!) 147/61  Pulse:  (!) 57 63 61  Resp:  '18 18 18  '$ Temp:  97.7 F (36.5 C) 98 F (36.7 C) 98 F (36.7 C)  TempSrc:  Oral Oral Oral  SpO2:  100% 97% 96%  Weight: 65.7 kg     Height:       General: Pt is alert, awake, not in acute distress; she is hard of hearing Cardiovascular: RRR, S1/S2 +, no rubs, no gallops Respiratory: Slightly diminished bilaterally, no wheezing, no rhonchi Abdominal: Soft, NT, distended secondary body habitus, bowel sounds + Extremities: Minimal edema, no cyanosis  The results of significant diagnostics from this hospitalization (including imaging, microbiology, ancillary and laboratory) are listed below for reference.    Microbiology: Recent Results (from the past 240 hour(s))  SARS CORONAVIRUS 2 (TAT 6-24 HRS) Nasopharyngeal Nasopharyngeal Swab     Status: None   Collection Time: 10/09/20  3:47 PM    Specimen: Nasopharyngeal Swab  Result Value Ref Range Status   SARS Coronavirus 2 NEGATIVE NEGATIVE Final    Comment: (NOTE) SARS-CoV-2 target nucleic acids are NOT DETECTED.  The SARS-CoV-2 RNA is generally detectable in upper and lower respiratory specimens during the acute phase of infection. Negative results do not preclude SARS-CoV-2 infection, do not rule out co-infections with other pathogens, and should not be used as the sole basis for treatment or other patient management decisions. Negative results must be combined with clinical observations, patient history, and epidemiological information. The expected result is Negative.  Fact Sheet for Patients: SugarRoll.be  Fact Sheet for Healthcare Providers: https://www.woods-mathews.com/  This test is not yet approved or cleared by the [place] FDA and  has been authorized for detection and/or  diagnosis of SARS-CoV-2 by FDA under an Emergency Use Authorization (EUA). This EUA will remain  in effect (meaning this test can be used) for the duration of the COVID-19 declaration under Se ction 564(b)(1) of the Act, 21 U.S.C. section 360bbb-3(b)(1), unless the authorization is terminated or revoked sooner.  Performed at [facility], [place] [address] [place], [place] [address]     Labs: BNP (last 3 results) No results for input(s): BNP in the last 8760 hours. Basic Metabolic Panel: Recent Labs  Lab 10/06/20 0906 10/10/20 0440  NA 139 141  K 4.1 4.3  CL 109 111  CO2 25 25  GLUCOSE 172* 101*  BUN 29* 31*  CREATININE 0.81 0.82  CALCIUM 9.4 9.7  MG 2.0 2.2  PHOS 2.9 3.5    Liver Function Tests: No results for input(s): AST, ALT, ALKPHOS, BILITOT, PROT, ALBUMIN in the last 168 hours. No results for input(s): LIPASE, AMYLASE in the last 168 hours. No results for input(s): AMMONIA in the last 168 hours. CBC: Recent Labs  Lab 10/06/20 0906 10/10/20 0440  WBC 7.3 6.6   NEUTROABS 4.8 3.7  HGB 11.8* 11.1*  HCT 37.9 33.5*  MCV 95.5 92.3  PLT 202 185    Cardiac Enzymes: No results for input(s): CKTOTAL, CKMB, CKMBINDEX, TROPONINI in the last 168 hours. BNP: Invalid input(s): POCBNP CBG: Recent Labs  Lab 10/10/20 0740 10/10/20 1104 10/10/20 1650 10/10/20 2115 10/11/20 0747  GLUCAP 95 156* 95 116* 90    D-Dimer No results for input(s): DDIMER in the last 72 hours. Hgb A1c No results for input(s): HGBA1C in the last 72 hours. Lipid Profile No results for input(s): CHOL, HDL, LDLCALC, TRIG, CHOLHDL, LDLDIRECT in the last 72 hours. Thyroid function studies No results for input(s): TSH, T4TOTAL, T3FREE, THYROIDAB in the last 72 hours.  Invalid input(s): FREET3 Anemia work up No results for input(s): VITAMINB12, FOLATE, FERRITIN, TIBC, IRON, RETICCTPCT in the last 72 hours. Urinalysis    Component Value Date/Time   COLORURINE YELLOW 09/26/2020 0526   APPEARANCEUR CLOUDY (A) 09/26/2020 0526   LABSPEC 1.020 09/26/2020 0526   PHURINE 6.0 09/26/2020 0526   GLUCOSEU NEGATIVE 09/26/2020 0526   GLUCOSEU NEGATIVE 01/16/2011 0914   HGBUR MODERATE (A) 09/26/2020 0526   BILIRUBINUR NEGATIVE 09/26/2020 0526   BILIRUBINUR neg 05/01/2017 1220   KETONESUR NEGATIVE 09/26/2020 0526   PROTEINUR 30 (A) 09/26/2020 0526   UROBILINOGEN 0.2 05/01/2017 1220   UROBILINOGEN 0.2 04/14/2012 0930   NITRITE POSITIVE (A) 09/26/2020 0526   LEUKOCYTESUR LARGE (A) 09/26/2020 0526   Sepsis Labs Invalid input(s): PROCALCITONIN,  WBC,  LACTICIDVEN Microbiology Recent Results (from the past 240 hour(s))  SARS CORONAVIRUS 2 (TAT 6-24 HRS) Nasopharyngeal Nasopharyngeal Swab     Status: None   Collection Time: 10/09/20  3:47 PM   Specimen: Nasopharyngeal Swab  Result Value Ref Range Status   SARS Coronavirus 2 NEGATIVE NEGATIVE Final    Comment: (NOTE) SARS-CoV-2 target nucleic acids are NOT DETECTED.  The SARS-CoV-2 RNA is generally detectable in upper and  lower respiratory specimens during the acute phase of infection. Negative results do not preclude SARS-CoV-2 infection, do not rule out co-infections with other pathogens, and should not be used as the sole basis for treatment or other patient management decisions. Negative results must be combined with clinical observations, patient history, and epidemiological information. The expected result is Negative.  Fact Sheet for Patients: SugarRoll.be  Fact Sheet for Healthcare Providers: https://www.woods-mathews.com/  This test is not yet approved  or cleared by the [place] and  has been authorized for detection and/or diagnosis of SARS-CoV-2 by FDA under an Emergency Use Authorization (EUA). This EUA will remain  in effect (meaning this test can be used) for the duration of the COVID-19 declaration under Se ction 564(b)(1) of the Act, 21 U.S.C. section 360bbb-3(b)(1), unless the authorization is terminated or revoked sooner.  Performed at [facility], [place] [address], [place], [place] [address]    Time coordinating discharge: 35 minutes  SIGNED:  Kerney Elbe, DO [facility] 10/11/2020, 8:13 AM Pager is on [place]  If 7PM-7AM, please contact night-coverage [email or web address]

## 2020-10-12 DIAGNOSIS — F0390 Unspecified dementia without behavioral disturbance: Secondary | ICD-10-CM | POA: Diagnosis not present

## 2020-10-12 DIAGNOSIS — I10 Essential (primary) hypertension: Secondary | ICD-10-CM | POA: Diagnosis not present

## 2020-10-12 DIAGNOSIS — E118 Type 2 diabetes mellitus with unspecified complications: Secondary | ICD-10-CM | POA: Diagnosis not present

## 2020-10-12 DIAGNOSIS — R296 Repeated falls: Secondary | ICD-10-CM | POA: Diagnosis not present

## 2020-10-12 DIAGNOSIS — I480 Paroxysmal atrial fibrillation: Secondary | ICD-10-CM | POA: Diagnosis not present

## 2020-10-12 LAB — GLUCOSE, CAPILLARY: Glucose-Capillary: 113 mg/dL — ABNORMAL HIGH (ref 70–99)

## 2020-10-13 DIAGNOSIS — K5900 Constipation, unspecified: Secondary | ICD-10-CM | POA: Diagnosis not present

## 2020-10-13 DIAGNOSIS — Z7901 Long term (current) use of anticoagulants: Secondary | ICD-10-CM | POA: Diagnosis not present

## 2020-10-13 DIAGNOSIS — Z87891 Personal history of nicotine dependence: Secondary | ICD-10-CM | POA: Diagnosis not present

## 2020-10-13 DIAGNOSIS — H9190 Unspecified hearing loss, unspecified ear: Secondary | ICD-10-CM | POA: Diagnosis not present

## 2020-10-13 DIAGNOSIS — F32A Depression, unspecified: Secondary | ICD-10-CM | POA: Diagnosis not present

## 2020-10-13 DIAGNOSIS — G2581 Restless legs syndrome: Secondary | ICD-10-CM | POA: Diagnosis not present

## 2020-10-13 DIAGNOSIS — R609 Edema, unspecified: Secondary | ICD-10-CM | POA: Diagnosis not present

## 2020-10-13 DIAGNOSIS — R001 Bradycardia, unspecified: Secondary | ICD-10-CM | POA: Diagnosis not present

## 2020-10-13 DIAGNOSIS — F0390 Unspecified dementia without behavioral disturbance: Secondary | ICD-10-CM | POA: Diagnosis not present

## 2020-10-13 DIAGNOSIS — M1990 Unspecified osteoarthritis, unspecified site: Secondary | ICD-10-CM | POA: Diagnosis not present

## 2020-10-13 DIAGNOSIS — H04123 Dry eye syndrome of bilateral lacrimal glands: Secondary | ICD-10-CM | POA: Diagnosis not present

## 2020-10-13 DIAGNOSIS — I10 Essential (primary) hypertension: Secondary | ICD-10-CM | POA: Diagnosis not present

## 2020-10-13 DIAGNOSIS — R7401 Elevation of levels of liver transaminase levels: Secondary | ICD-10-CM | POA: Diagnosis not present

## 2020-10-13 DIAGNOSIS — Z96652 Presence of left artificial knee joint: Secondary | ICD-10-CM | POA: Diagnosis not present

## 2020-10-13 DIAGNOSIS — I480 Paroxysmal atrial fibrillation: Secondary | ICD-10-CM | POA: Diagnosis not present

## 2020-10-13 DIAGNOSIS — K219 Gastro-esophageal reflux disease without esophagitis: Secondary | ICD-10-CM | POA: Diagnosis not present

## 2020-10-13 DIAGNOSIS — Z86718 Personal history of other venous thrombosis and embolism: Secondary | ICD-10-CM | POA: Diagnosis not present

## 2020-10-13 DIAGNOSIS — Z9181 History of falling: Secondary | ICD-10-CM | POA: Diagnosis not present

## 2020-10-13 DIAGNOSIS — G4700 Insomnia, unspecified: Secondary | ICD-10-CM | POA: Diagnosis not present

## 2020-10-13 DIAGNOSIS — M4800 Spinal stenosis, site unspecified: Secondary | ICD-10-CM | POA: Diagnosis not present

## 2020-10-13 DIAGNOSIS — E1140 Type 2 diabetes mellitus with diabetic neuropathy, unspecified: Secondary | ICD-10-CM | POA: Diagnosis not present

## 2020-10-28 ENCOUNTER — Telehealth: Payer: Self-pay

## 2020-10-28 NOTE — Telephone Encounter (Signed)
[facility] called to re-establish care however pt has not been seen since 2016

## 2020-11-12 IMAGING — CT CT CERVICAL SPINE W/O CM
4 series · 16 of 33 positions shown, 18 images · non-contrast
Comparison: CT head dated 07/13/2016

CLINICAL DATA: Fall, headache

EXAM:
CT HEAD WITHOUT CONTRAST
CT CERVICAL SPINE WITHOUT CONTRAST
TECHNIQUE: Multidetector CT imaging of the head and cervical spine was
performed following the standard protocol without intravenous
contrast. Multiplanar CT image reconstructions of the cervical spine
were also generated.

[Series 8: c spine soft · axial · 0.32mm/px · z∈[-180,-104]mm · 4 of 89 slices shown]
[im 13/89  soft-tissue]
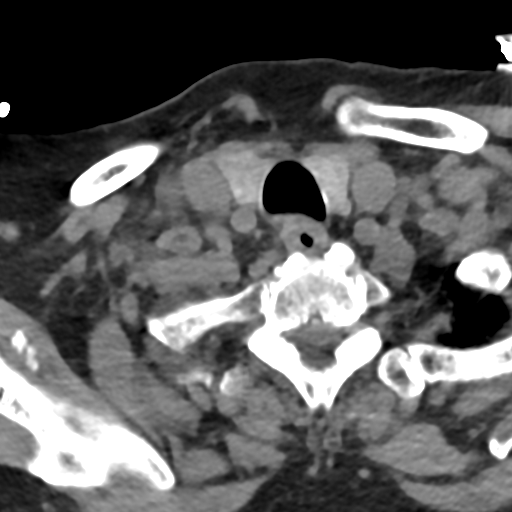
[im 26/89  soft-tissue]
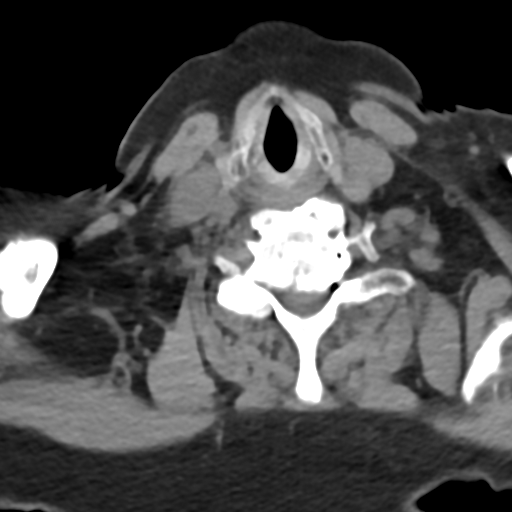
[im 38/89  soft-tissue]
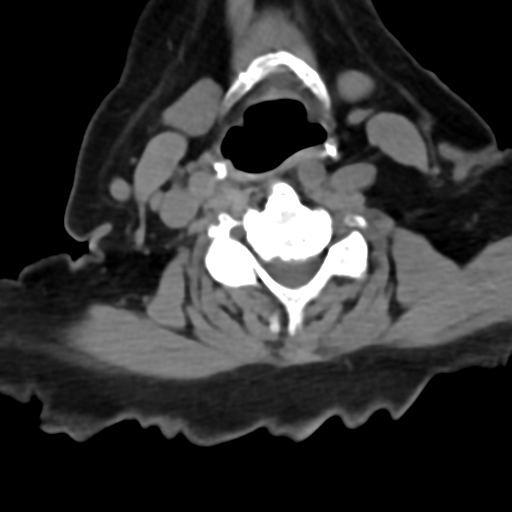
[im 51/89  soft-tissue]
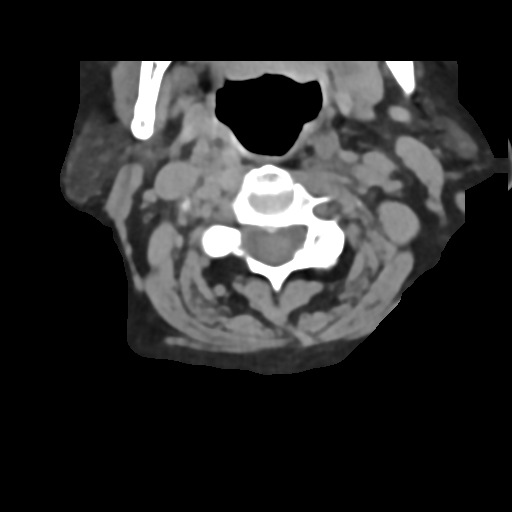

[Series 11: sag bone · sagittal · 0.39mm/px · 5 of 99 slices shown, 6 images]
[im 33/99  bone]
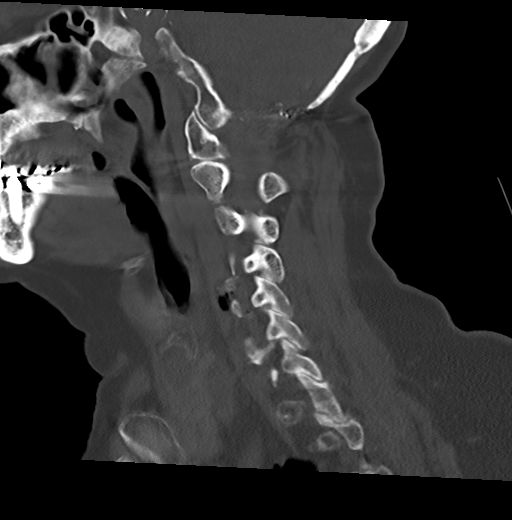
[im 41/99  bone]
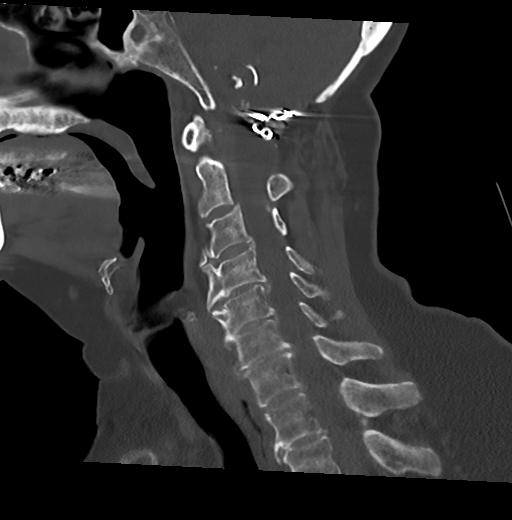
[im 50/99  soft-tissue]
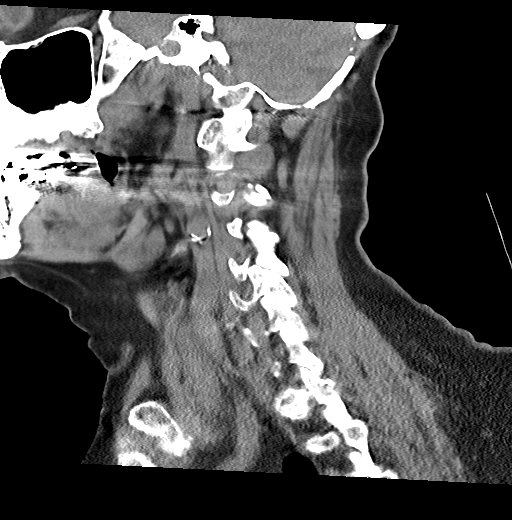
[im 50/99  bone]
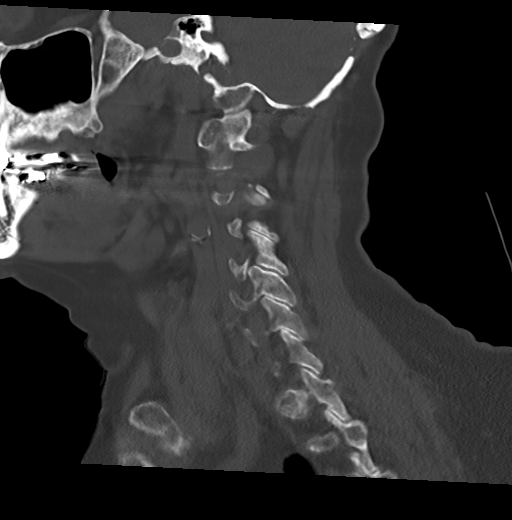
[im 58/99  bone]
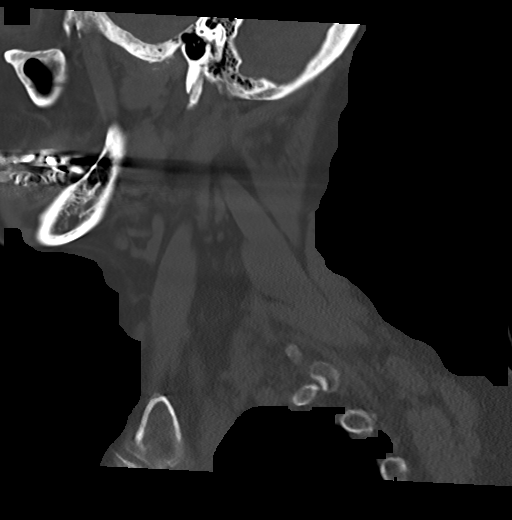
[im 66/99  bone]
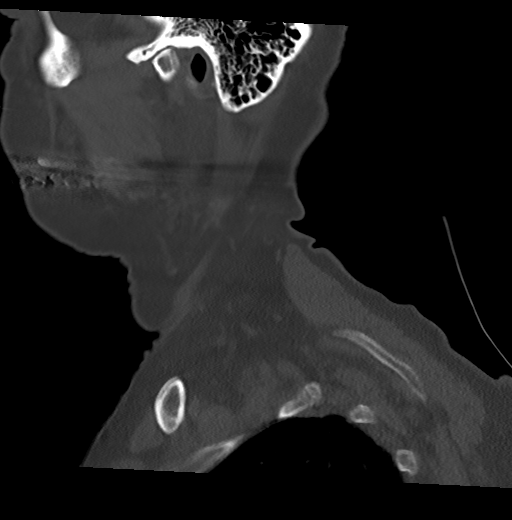

[Series 12: cor bone · coronal · 0.28mm/px · 3 of 84 slices shown]
[im 17/84  bone]
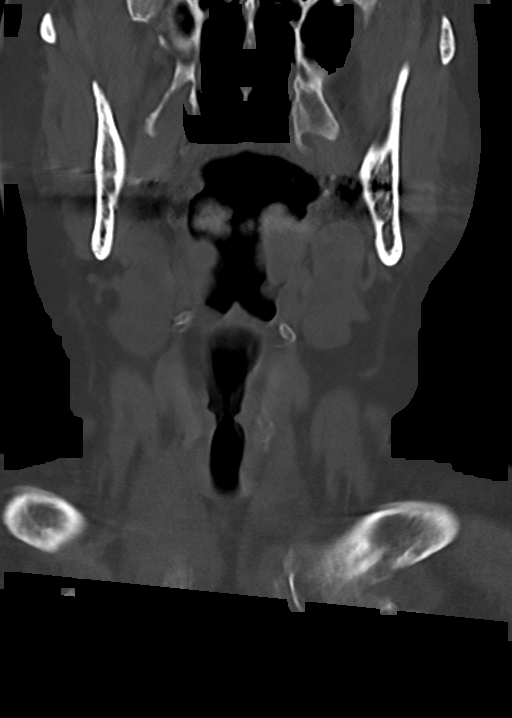
[im 34/84  bone]
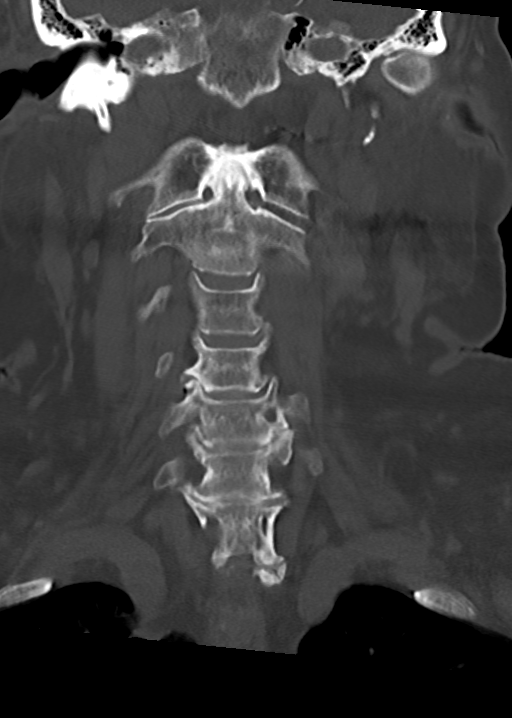
[im 50/84  bone]
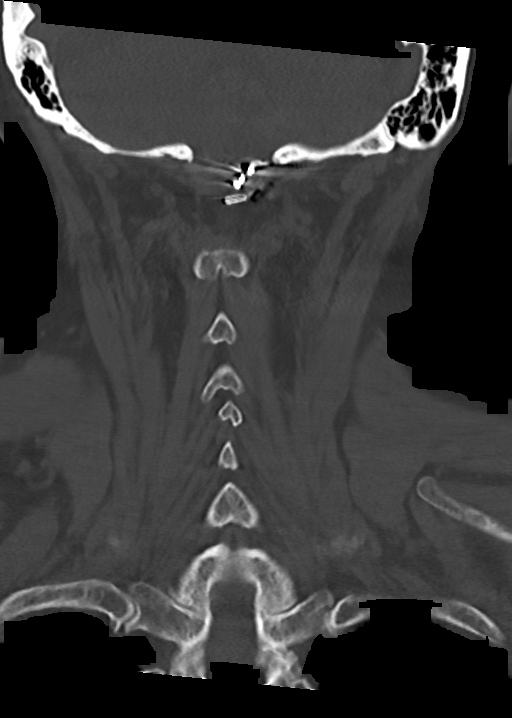

[Series 14: orthogonal axials · axial · 0.21mm/px · z∈[-187,-102]mm · 4 of 77 slices shown, 5 images]
[im 16/77  soft-tissue]
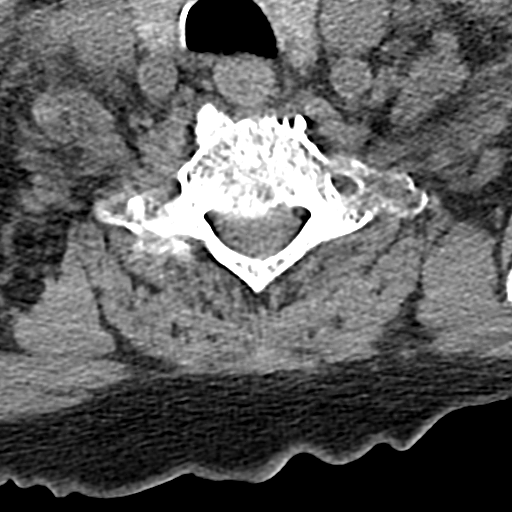
[im 16/77  bone]
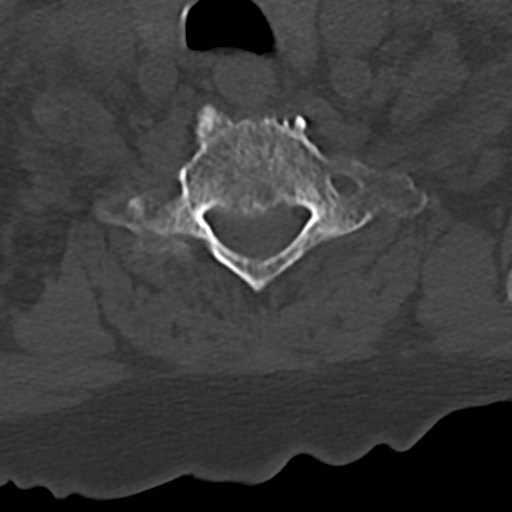
[im 31/77  bone]
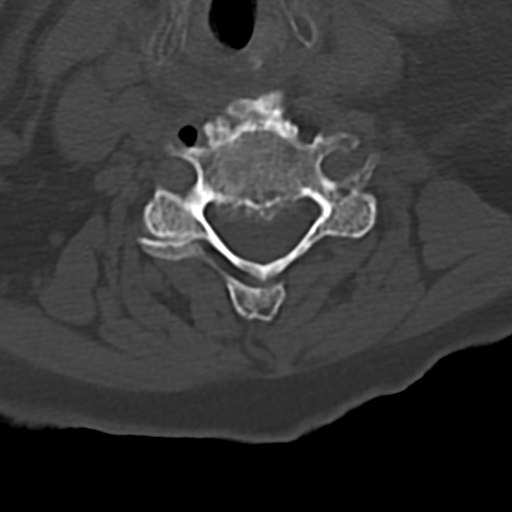
[im 46/77  bone]
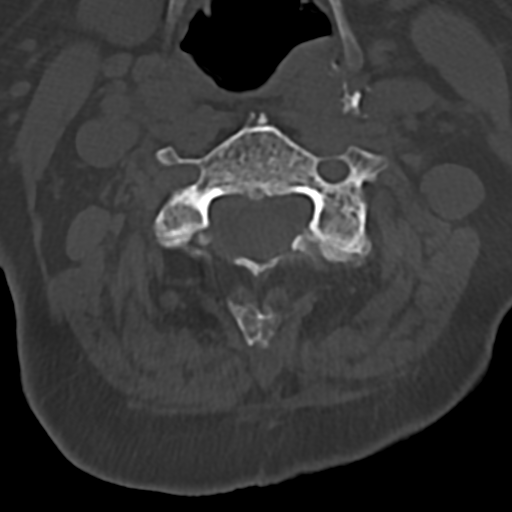
[im 61/77  bone]
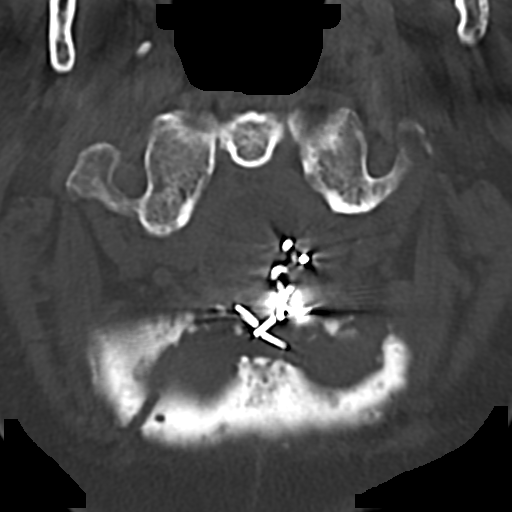

[16 of 33 positions shown; findings below may reference images not displayed]

FINDINGS: CT HEAD FINDINGS

Brain: No evidence of acute infarction, hemorrhage, hydrocephalus,
extra-axial collection or mass lesion/mass effect.

Global cortical and central atrophy. Secondary ventricular
prominence.

Subcortical white matter and periventricular small vessel ischemic
changes.

Vascular: Intracranial atherosclerosis. Left vertebral artery
aneurysm clips.

Skull: Prior suboccipital craniectomy. Negative for fracture focal
lesion.

Sinuses/Orbits: Partial opacification of the right maxillary sinus.
Visualized paranasal sinuses mastoid air cells are otherwise clear.

Other: None.

CT CERVICAL SPINE FINDINGS

Alignment: Normal cervical lordosis.

Skull base and vertebrae: No acute fracture. No primary bone lesion
or focal pathologic process.

Soft tissues and spinal canal: No prevertebral fluid or swelling. No
visible canal hematoma.

Disc levels: Moderate multilevel degenerative changes. Spinal canal
is patent.

Upper chest: Visualized lung apices are notable for right apical
pleural-parenchymal scarring.

Other: Visualized thyroid is notable for a 13 mm right thyroid
nodule.
IMPRESSION: No evidence of acute intracranial abnormality. Atrophy with small
vessel ischemic changes.

Prior suboccipital craniectomy and postprocedural changes related to
prior left vertebral artery aneurysm clipping.

No evidence medic injury to cervical spine. Moderate multilevel
degenerative changes.

## 2020-11-12 IMAGING — DX DG FOOT COMPLETE 3+V*L*
3 series · 3 of 3 positions shown · non-contrast
Comparison: None.

CLINICAL DATA: Acute LEFT foot pain following fall. Initial
encounter.

EXAM:
LEFT FOOT - COMPLETE 3+ VIEW

[foot ap]
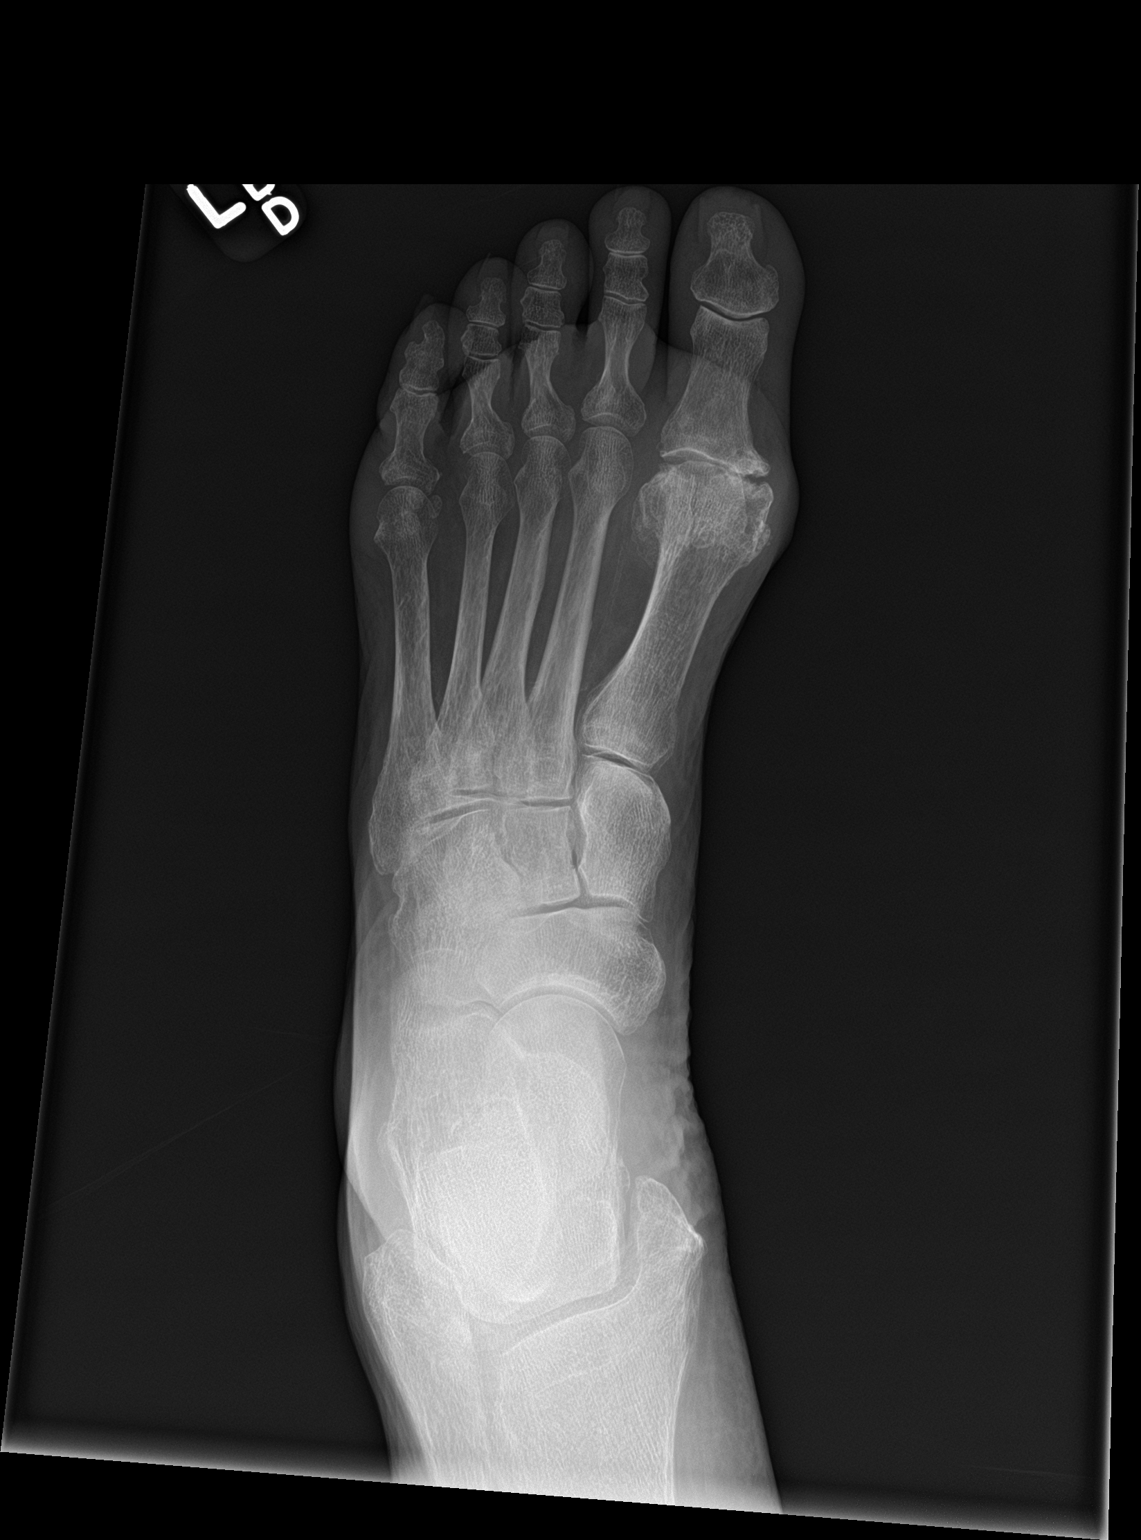

[foot obl]
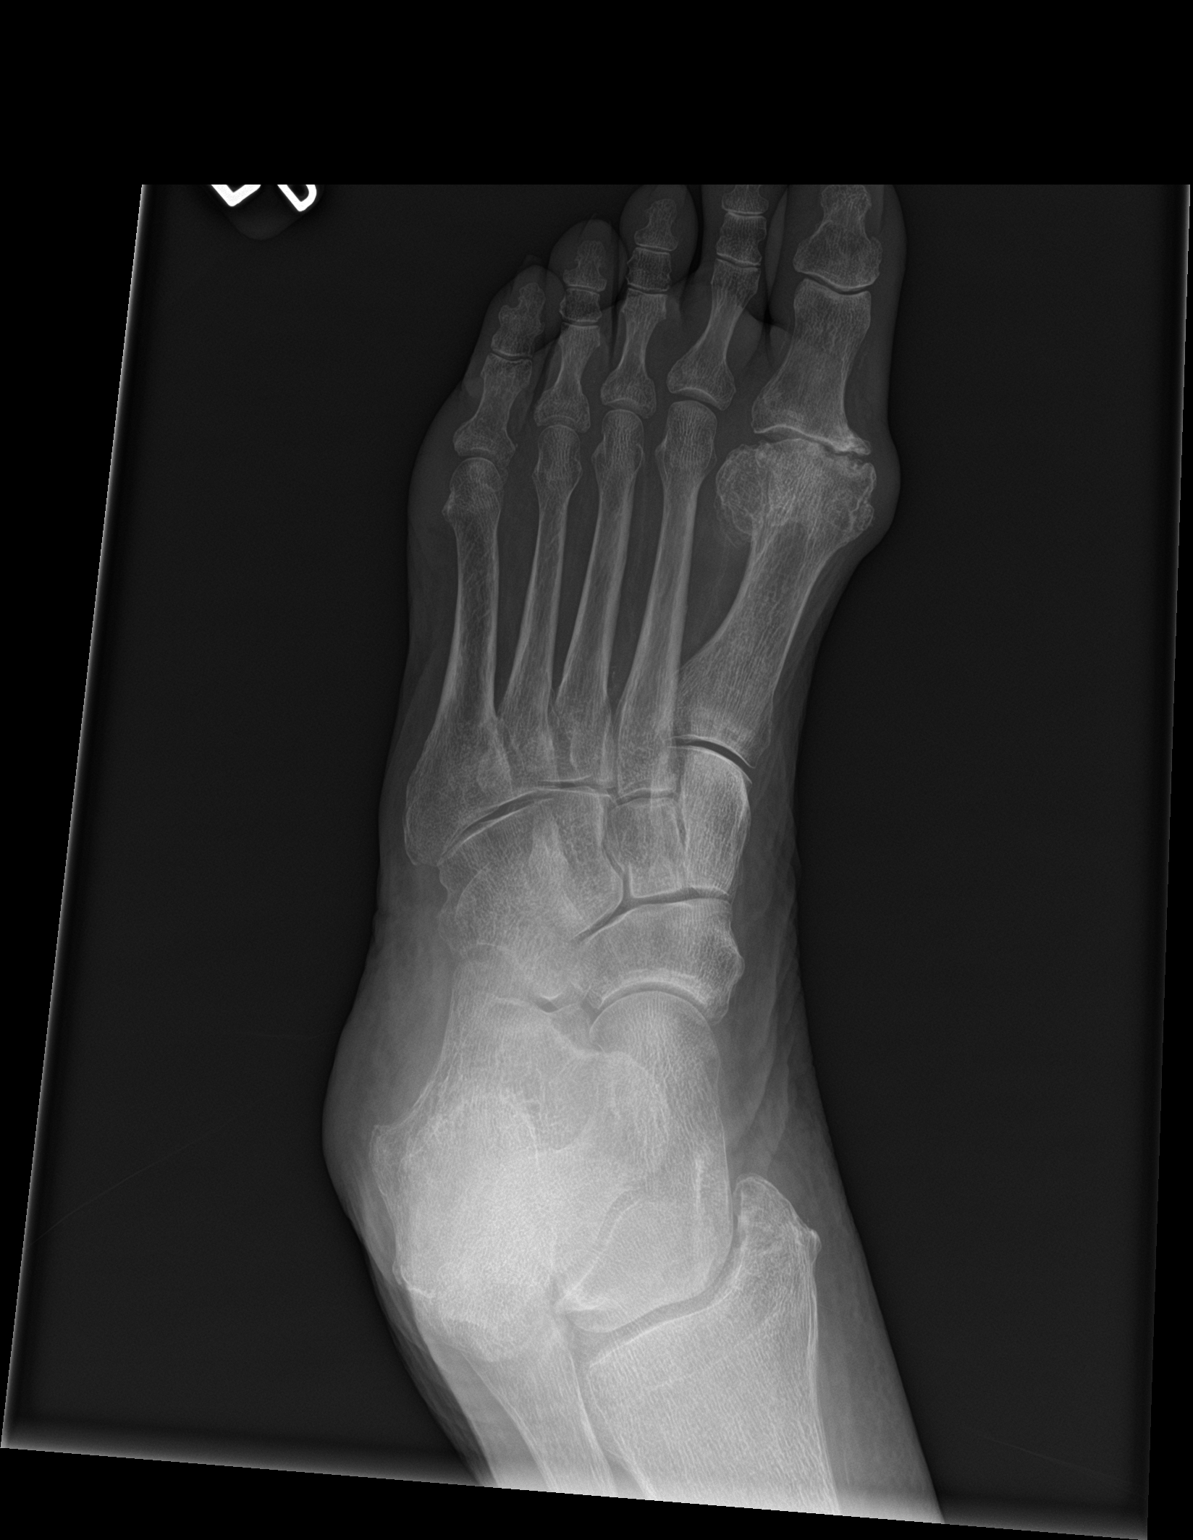

[foot lat]
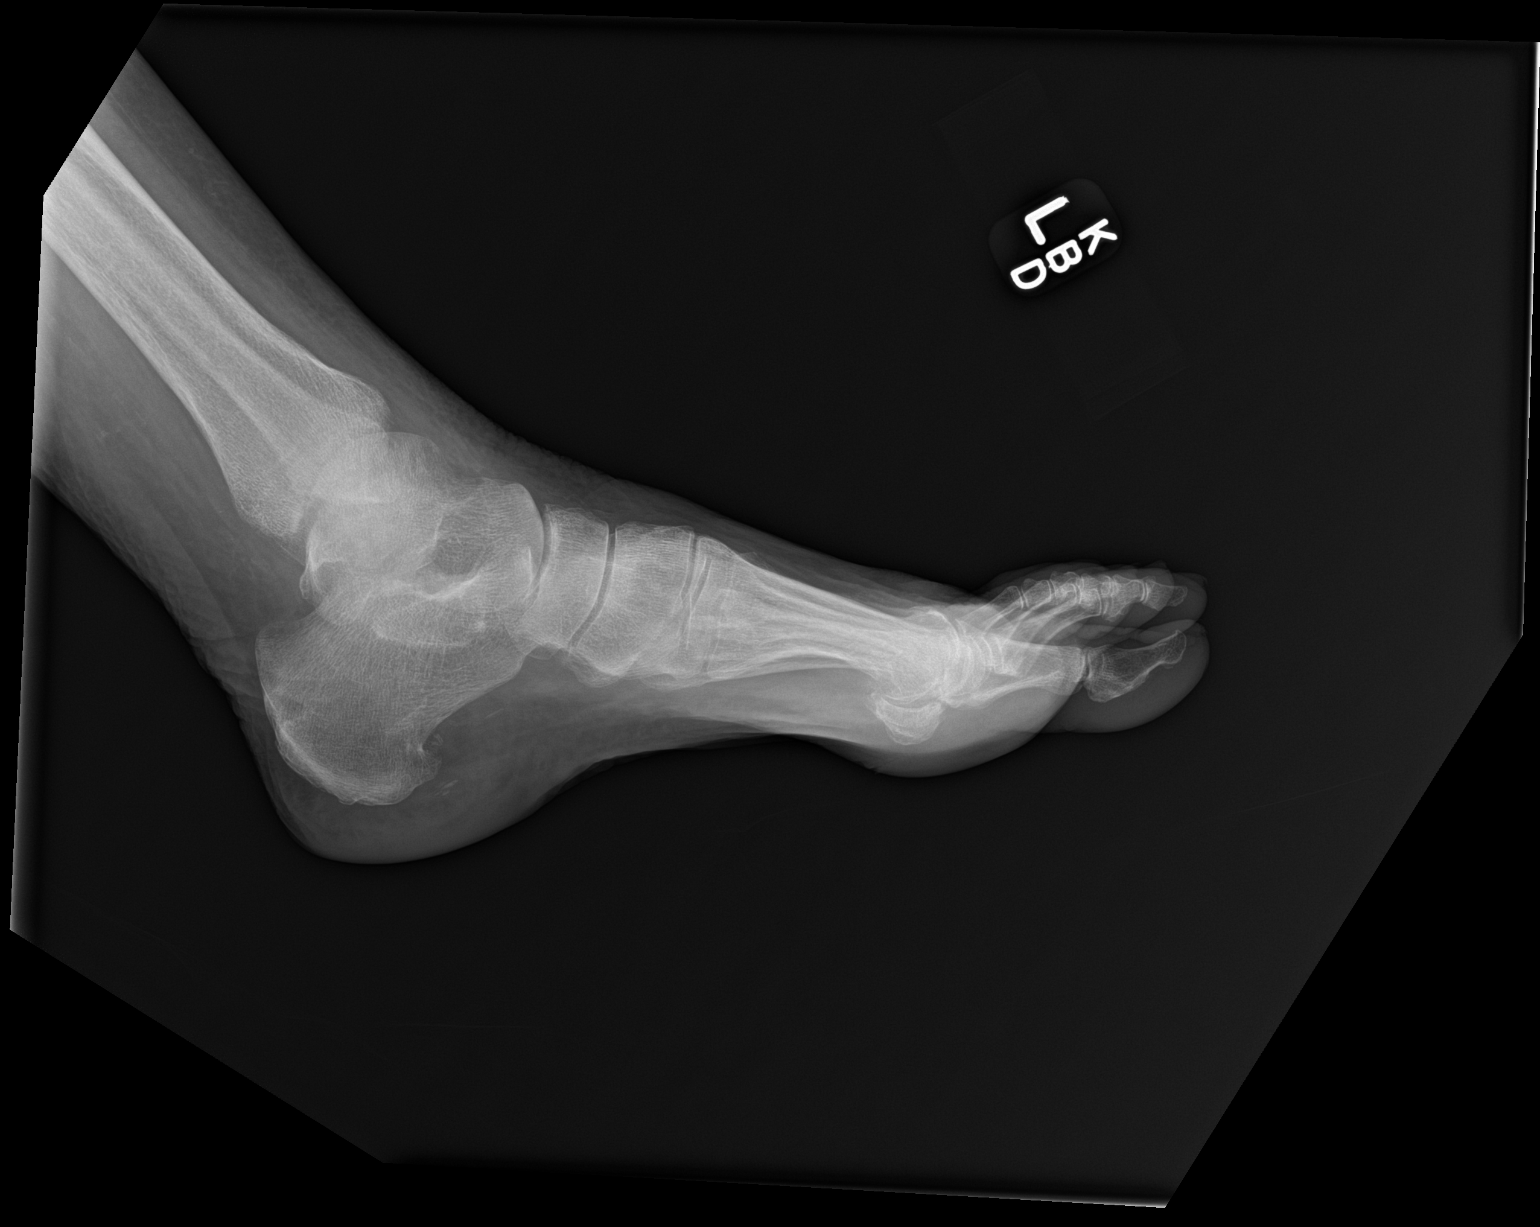

[3 of 3 positions shown; findings below may reference images not displayed]

FINDINGS: No acute fracture, subluxation or dislocation.

Mild degenerative changes at the 1st MTP joint noted.

No focal bony lesions are present.
IMPRESSION: No evidence of acute bony abnormality.

## 2020-12-05 ENCOUNTER — Ambulatory Visit: Payer: Medicare Other | Admitting: Nurse Practitioner

## 2020-12-09 DIAGNOSIS — I10 Essential (primary) hypertension: Secondary | ICD-10-CM | POA: Diagnosis not present

## 2020-12-09 DIAGNOSIS — R609 Edema, unspecified: Secondary | ICD-10-CM | POA: Diagnosis not present

## 2020-12-09 DIAGNOSIS — W19XXXA Unspecified fall, initial encounter: Secondary | ICD-10-CM | POA: Diagnosis not present

## 2020-12-11 ENCOUNTER — Emergency Department (HOSPITAL_COMMUNITY)
Admission: EM | Admit: 2020-12-11 | Discharge: 2020-12-12 | Disposition: A | Discharge status: Home / Self Care | Payer: Medicare Other | Attending: Emergency Medicine | Admitting: Emergency Medicine

## 2020-12-11 ENCOUNTER — Emergency Department (HOSPITAL_COMMUNITY): Payer: Medicare Other

## 2020-12-11 ENCOUNTER — Other Ambulatory Visit: Payer: Self-pay

## 2020-12-11 ENCOUNTER — Encounter (HOSPITAL_COMMUNITY): Payer: Self-pay

## 2020-12-11 DIAGNOSIS — S8002XA Contusion of left knee, initial encounter: Secondary | ICD-10-CM | POA: Insufficient documentation

## 2020-12-11 DIAGNOSIS — S51812A Laceration without foreign body of left forearm, initial encounter: Secondary | ICD-10-CM | POA: Diagnosis not present

## 2020-12-11 DIAGNOSIS — I4891 Unspecified atrial fibrillation: Secondary | ICD-10-CM | POA: Insufficient documentation

## 2020-12-11 DIAGNOSIS — Y92009 Unspecified place in unspecified non-institutional (private) residence as the place of occurrence of the external cause: Secondary | ICD-10-CM | POA: Diagnosis not present

## 2020-12-11 DIAGNOSIS — S0990XA Unspecified injury of head, initial encounter: Secondary | ICD-10-CM | POA: Diagnosis not present

## 2020-12-11 DIAGNOSIS — Z79899 Other long term (current) drug therapy: Secondary | ICD-10-CM | POA: Insufficient documentation

## 2020-12-11 DIAGNOSIS — Z043 Encounter for examination and observation following other accident: Secondary | ICD-10-CM | POA: Diagnosis not present

## 2020-12-11 DIAGNOSIS — M1712 Unilateral primary osteoarthritis, left knee: Secondary | ICD-10-CM | POA: Diagnosis not present

## 2020-12-11 DIAGNOSIS — I10 Essential (primary) hypertension: Secondary | ICD-10-CM | POA: Diagnosis not present

## 2020-12-11 DIAGNOSIS — S8001XA Contusion of right knee, initial encounter: Secondary | ICD-10-CM | POA: Diagnosis not present

## 2020-12-11 DIAGNOSIS — S0083XA Contusion of other part of head, initial encounter: Secondary | ICD-10-CM | POA: Diagnosis not present

## 2020-12-11 DIAGNOSIS — S40011A Contusion of right shoulder, initial encounter: Secondary | ICD-10-CM | POA: Diagnosis not present

## 2020-12-11 DIAGNOSIS — E119 Type 2 diabetes mellitus without complications: Secondary | ICD-10-CM | POA: Diagnosis not present

## 2020-12-11 DIAGNOSIS — W01198A Fall on same level from slipping, tripping and stumbling with subsequent striking against other object, initial encounter: Secondary | ICD-10-CM | POA: Insufficient documentation

## 2020-12-11 DIAGNOSIS — T07XXXA Unspecified multiple injuries, initial encounter: Secondary | ICD-10-CM

## 2020-12-11 DIAGNOSIS — Z87891 Personal history of nicotine dependence: Secondary | ICD-10-CM | POA: Insufficient documentation

## 2020-12-11 DIAGNOSIS — Z7901 Long term (current) use of anticoagulants: Secondary | ICD-10-CM | POA: Insufficient documentation

## 2020-12-11 DIAGNOSIS — Z20822 Contact with and (suspected) exposure to covid-19: Secondary | ICD-10-CM | POA: Diagnosis not present

## 2020-12-11 DIAGNOSIS — M25462 Effusion, left knee: Secondary | ICD-10-CM | POA: Diagnosis not present

## 2020-12-11 DIAGNOSIS — R296 Repeated falls: Secondary | ICD-10-CM

## 2020-12-11 DIAGNOSIS — Z96652 Presence of left artificial knee joint: Secondary | ICD-10-CM | POA: Diagnosis not present

## 2020-12-11 DIAGNOSIS — J9811 Atelectasis: Secondary | ICD-10-CM | POA: Diagnosis not present

## 2020-12-11 DIAGNOSIS — S59911A Unspecified injury of right forearm, initial encounter: Secondary | ICD-10-CM | POA: Diagnosis present

## 2020-12-11 DIAGNOSIS — S51811A Laceration without foreign body of right forearm, initial encounter: Secondary | ICD-10-CM | POA: Diagnosis not present

## 2020-12-11 DIAGNOSIS — W19XXXA Unspecified fall, initial encounter: Secondary | ICD-10-CM | POA: Diagnosis not present

## 2020-12-11 LAB — COMPREHENSIVE METABOLIC PANEL
ALT: 41 U/L (ref 0–44)
AST: 54 U/L — ABNORMAL HIGH (ref 15–41)
Albumin: 3.6 g/dL (ref 3.5–5.0)
Alkaline Phosphatase: 81 U/L (ref 38–126)
Anion gap: 9 (ref 5–15)
BUN: 27 mg/dL — ABNORMAL HIGH (ref 8–23)
CO2: 23 mmol/L (ref 22–32)
Calcium: 9.2 mg/dL (ref 8.9–10.3)
Chloride: 112 mmol/L — ABNORMAL HIGH (ref 98–111)
Creatinine, Ser: 0.42 mg/dL — ABNORMAL LOW (ref 0.44–1.00)
GFR, Estimated: >60 mL/min (ref >60)
Glucose, Bld: 169 mg/dL — ABNORMAL HIGH (ref 70–99)
Potassium: 3.2 mmol/L — ABNORMAL LOW (ref 3.5–5.1)
Sodium: 144 mmol/L (ref 135–145)
Total Bilirubin: 0.9 mg/dL (ref 0.3–1.2)
Total Protein: 6.2 g/dL — ABNORMAL LOW (ref 6.5–8.1)

## 2020-12-11 LAB — CBC WITH DIFFERENTIAL/PLATELET
Abs Immature Granulocytes: 0.09 10*3/uL — ABNORMAL HIGH (ref 0.00–0.07)
Basophils Absolute: 0 10*3/uL (ref 0.0–0.1)
Basophils Relative: 0 %
Eosinophils Absolute: 0.1 10*3/uL (ref 0.0–0.5)
Eosinophils Relative: 1 %
HCT: 33.5 % — ABNORMAL LOW (ref 36.0–46.0)
Hemoglobin: 10.9 g/dL — ABNORMAL LOW (ref 12.0–15.0)
Immature Granulocytes: 1 %
Lymphocytes Relative: 11 %
Lymphs Abs: 1 10*3/uL (ref 0.7–4.0)
MCH: 29.9 pg (ref 26.0–34.0)
MCHC: 32.5 g/dL (ref 30.0–36.0)
MCV: 91.8 fL (ref 80.0–100.0)
Monocytes Absolute: 1 10*3/uL (ref 0.1–1.0)
Monocytes Relative: 10 %
Neutro Abs: 7.6 10*3/uL (ref 1.7–7.7)
Neutrophils Relative %: 77 %
Platelets: 152 10*3/uL (ref 150–400)
RBC: 3.65 MIL/uL — ABNORMAL LOW (ref 3.87–5.11)
RDW: 13.7 % (ref 11.5–15.5)
WBC: 9.8 10*3/uL (ref 4.0–10.5)
nRBC: 0 % (ref 0.0–0.2)

## 2020-12-11 LAB — RESP PANEL BY RT-PCR (FLU A&B, COVID) ARPGX2
Influenza A by PCR: NEGATIVE
Influenza B by PCR: NEGATIVE
SARS Coronavirus 2 by RT PCR: NEGATIVE

## 2020-12-11 LAB — TROPONIN I (HIGH SENSITIVITY)
Troponin I (High Sensitivity): 26 ng/L — ABNORMAL HIGH (ref <18)
Troponin I (High Sensitivity): 25 ng/L — ABNORMAL HIGH (ref <18)

## 2020-12-11 LAB — URINALYSIS, ROUTINE W REFLEX MICROSCOPIC
Bilirubin Urine: NEGATIVE
Glucose, UA: 150 mg/dL — AB
Ketones, ur: NEGATIVE mg/dL
Leukocytes,Ua: NEGATIVE
Nitrite: NEGATIVE
Protein, ur: NEGATIVE mg/dL
Specific Gravity, Urine: 1.013 (ref 1.005–1.030)
pH: 8 (ref 5.0–8.0)

## 2020-12-11 LAB — CBG MONITORING, ED
Glucose-Capillary: 142 mg/dL — ABNORMAL HIGH (ref 70–99)
Glucose-Capillary: 138 mg/dL — ABNORMAL HIGH (ref 70–99)

## 2020-12-11 LAB — CK: Total CK: 236 U/L — ABNORMAL HIGH (ref 38–234)

## 2020-12-11 MED ORDER — SENNOSIDES-DOCUSATE SODIUM 8.6-50 MG PO TABS
1.0000 | ORAL_TABLET | Freq: Two times a day (BID) | ORAL | Status: DC
  Administered 2020-12-11 – 2020-12-12 (×2): 1 via ORAL
  Filled 2020-12-11 (×2): qty 1

## 2020-12-11 MED ORDER — APIXABAN 5 MG PO TABS
5.0000 mg | ORAL_TABLET | Freq: Two times a day (BID) | ORAL | Status: DC
  Filled 2020-12-11 (×2): qty 1

## 2020-12-11 MED ORDER — METOPROLOL SUCCINATE ER 25 MG PO TB24
12.5000 mg | ORAL_TABLET | Freq: Every day | ORAL | Status: DC
  Filled 2020-12-11: qty 0.5

## 2020-12-11 MED ORDER — AMLODIPINE BESYLATE 5 MG PO TABS
10.0000 mg | ORAL_TABLET | Freq: Every day | ORAL | Status: DC
  Administered 2020-12-11 – 2020-12-12 (×2): 10 mg via ORAL
  Filled 2020-12-11 (×2): qty 2

## 2020-12-11 MED ORDER — INSULIN ASPART 100 UNIT/ML IJ SOLN
0.0000 [IU] | Freq: Three times a day (TID) | INTRAMUSCULAR | Status: DC
Start: 2020-12-11 — End: 2020-12-13
  Administered 2020-12-11 (×2): 1 [IU] via SUBCUTANEOUS
  Administered 2020-12-12: 2 [IU] via SUBCUTANEOUS
  Filled 2020-12-11: qty 0.09

## 2020-12-11 MED ORDER — ALBUTEROL SULFATE (2.5 MG/3ML) 0.083% IN NEBU: 2.5000 mg | INHALATION_SOLUTION | RESPIRATORY_TRACT | Status: DC | PRN

## 2020-12-11 MED ORDER — LISINOPRIL 20 MG PO TABS
40.0000 mg | ORAL_TABLET | Freq: Every day | ORAL | Status: DC
  Administered 2020-12-11 – 2020-12-12 (×2): 40 mg via ORAL
  Filled 2020-12-11 (×2): qty 2

## 2020-12-11 NOTE — Progress Notes (Signed)
CSW spoke with patient again who stated she did not want to go to rehab. CSW explained to patient that she would have to be able to get up and walk on her own. Patient stated she cannot walk. CSW told patient that is the reason she would need to participate in rehab. Patient agreed and stated she wanted to choose which facility she goes too. Patient stated she does not want to go back to [facility].

## 2020-12-11 NOTE — ED Provider Notes (Signed)
[facility] DEPT Provider Note   CSN: [id number] Arrival date & time: 12/11/20  5625     History Chief Complaint  Patient presents with   Fall    Pt lives alone, has had 3 falls this week, is not able to get up to go to the bathroom so was found with urine on her upon ems arrival, has multiple skin tears of varying stages of healing, denies any pain from the fall today.    Kristina Wilkinson is a 87 y.o. female.  Patient presents from home after falling and not been able to get up on her own.  She apparently lives alone and recently came out of rehab.  States her legs gave out from underneath her and she fell forward striking her face, head and right shoulder.  Denies losing consciousness.  She does take Eliquis for history of atrial fibrillation.  She denies any preceding dizziness or lightheadedness.  States she hurts in her shoulder, bilateral knees and head.  Admits to living alone without any help at home.  States she left rehab several days ago.  On EMS arrival they noted she had various skin tears in various stages of healing and was found to be incontinent of urine.  Patient unable to get to the bathroom on her own.  She denies any chest pain, abdominal pain, difficulty breathing.  The history is provided by the patient and the EMS personnel.  Fall Associated symptoms include headaches. Pertinent negatives include no abdominal pain and no shortness of breath.      Past Medical History:  Diagnosis Date   Arthritis    CNS aneurysm 1983   s/p clipping   Diabetes mellitus type II    diet controlled    DVT (deep venous thrombosis) ([place]) 10/15   Elevated glucose    Hypertension    Insomnia    Knee pain    Left Dr. Meyer Cory   LBP (low back pain)    PN (peripheral neuropathy)    unknown etiol    Patient Active Problem List   Diagnosis Date Noted   New onset a-fib ([place]) 09/25/2020   Restless leg syndrome 03/14/2019   Sepsis due to gram-negative  UTI ([place]) 03/06/2019   Sepsis due to urinary tract infection ([place]) 03/05/2019   Falls 03/05/2019   Open toe wound 12/01/2018   Traumatic rhabdomyolysis ([place]) 11/30/2018   Cellulitis 11/30/2018   Transaminitis 11/30/2018   Abnormal urinalysis 11/30/2018   Leukocytosis 11/30/2018   Hypokalemia 11/30/2018   Scalp laceration    Skull fracture ([place]) 07/13/2016   Chronic left shoulder pain 04/03/2016   Diabetes mellitus without complication ([place]) 63/89/3734   Hearing loss 06/21/2015   Advance care planning 12/17/2014   Palpitations 07/06/2014   Pain in joint, ankle and foot 04/20/2014   DVT, lower extremity ([place]) 11/19/2013   Edema 10/20/2013   Headache(784.0) 10/20/2013   Left leg swelling 10/20/2013   Aneurysm of basilar artery ([facility]) 10/20/2013   Bradycardia 10/20/2013   Vitamin B12 deficiency 07/16/2013   Paresthesia of both feet 05/17/2013   TMJ arthritis 09/11/2012   Ear pain 09/11/2012   History of transfusion of packed red blood cells 09/11/2012   Spinal stenosis 04/13/2011   Well adult exam 01/23/2011   Cholecystitis with cholelithiasis, s/p lap chole 11/10/2010 12/01/2010   Shoulder pain, right 07/25/2010   TOBACCO USE, QUIT 03/09/2009   KNEE PAIN 05/18/2008   PN (peripheral neuropathy) 08/19/2007   LEG PAIN 08/19/2007   Vitamin  D deficiency 02/17/2007   INSOMNIA, PERSISTENT 02/17/2007   HTN (hypertension) 02/17/2007   GERD 02/17/2007   Osteoarthritis 02/17/2007   LOW BACK PAIN 02/17/2007   Hyperglycemia 01/28/2007   UNSPECIFIED OSTEOPOROSIS 01/28/2007   PRECIPITOUS DROP IN HEMATOCRIT 01/28/2007    Past Surgical History:  Procedure Laterality Date   CEREBRAL ANEURYSM REPAIR     initial repair in the 1980s, with second surgery in the [facility]  04/2009   Left   TOTAL KNEE ARTHROPLASTY     TUBAL LIGATION       OB History   No obstetric history on file.     Family History  Problem Relation Age of  Onset   Arthritis Mother    Stroke Mother    Stroke Father    Arthritis-Osteo Son     Social History   Tobacco Use   Smoking status: Former   Smokeless tobacco: Never   Tobacco comments:    quit in 1983  Vaping Use   Vaping Use: Never used  Substance Use Topics   Alcohol use: No    Alcohol/week: 0.0 standard drinks   Drug use: No    Home Medications Prior to Admission medications   Medication Sig Start Date End Date Taking? Authorizing Provider  acetaminophen (TYLENOL) 325 MG tablet Take 2 tablets (650 mg total) by mouth every 6 (six) hours as needed for mild pain (or Fever >/= 101). 10/10/20   Sheikh, Omair Latif, DO  albuterol (PROVENTIL) (2.5 MG/3ML) 0.083% nebulizer solution Take 3 mLs (2.5 mg total) by nebulization every 4 (four) hours as needed for wheezing or shortness of breath. 10/10/20   Raiford Noble Latif, DO  amLODipine (NORVASC) 10 MG tablet Take 1 tablet (10 mg total) by mouth daily. 10/10/20   Raiford Noble Latif, DO  apixaban (ELIQUIS) 5 MG TABS tablet Take 1 tablet (5 mg total) by mouth 2 (two) times daily. 10/10/20   Raiford Noble Latif, DO  bisacodyl (DULCOLAX) 10 MG suppository Place 1 suppository (10 mg total) rectally daily as needed for moderate constipation. 10/10/20   Raiford Noble Latif, DO  lisinopril (ZESTRIL) 40 MG tablet Take 1 tablet (40 mg total) by mouth daily. 10/10/20   Raiford Noble Latif, DO  metoprolol succinate (TOPROL-XL) 25 MG 24 hr tablet Take 0.5 tablets (12.5 mg total) by mouth daily. 10/10/20   Sheikh, Omair Latif, DO  ondansetron (ZOFRAN) 4 MG tablet Take 1 tablet (4 mg total) by mouth every 6 (six) hours as needed for nausea. 10/10/20   Sheikh, Georgina Quint Latif, DO  polyethylene glycol (MIRALAX / GLYCOLAX) 17 g packet Take 17 g by mouth 2 (two) times daily. 10/10/20   Raiford Noble Latif, DO  polyvinyl alcohol (LIQUIFILM TEARS) 1.4 % ophthalmic solution Place 1 drop into both eyes as needed for dry eyes. 10/10/20   Sheikh, Omair Latif, DO   senna-docusate (SENOKOT-S) 8.6-50 MG tablet Take 1 tablet by mouth 2 (two) times daily. 10/10/20   Raiford Noble Latif, DO    Allergies    Gabapentin, Other, Penicillins, Pregabalin, and Vit d-vit e-safflower oil  Review of Systems   Review of Systems  Constitutional:  Negative for activity change, appetite change, fatigue and fever.  HENT:  Negative for congestion and rhinorrhea.   Eyes:  Negative for visual disturbance.  Respiratory:  Negative for cough, chest tightness and shortness of breath.   Gastrointestinal:  Negative for abdominal pain, nausea and  vomiting.  Genitourinary:  Negative for dysuria and hematuria.  Musculoskeletal:  Positive for arthralgias and myalgias.  Skin:  Positive for wound. Negative for rash.  Neurological:  Positive for headaches. Negative for dizziness and weakness.   all other systems are negative except as noted in the HPI and PMH.   Physical Exam Updated Vital Signs BP (!) 170/73 (BP Location: Right Arm)   Pulse 86   Temp 98.2 F (36.8 C) (Oral)   Resp 16   Ht 5\' 6"  (1.676 m)   Wt 74.8 kg   SpO2 99%   BMI 26.63 kg/m   Physical Exam Vitals and nursing note reviewed.  Constitutional:      General: She is not in acute distress.    Appearance: She is well-developed. She is not ill-appearing.     Comments: Oriented to person and place  HENT:     Head: Normocephalic.     Comments: Ecchymosis right forehead and right jaw.    Mouth/Throat:     Pharynx: No oropharyngeal exudate.  Eyes:     Conjunctiva/sclera: Conjunctivae normal.     Pupils: Pupils are equal, round, and reactive to light.  Neck:     Comments: No meningismus. Cardiovascular:     Rate and Rhythm: Normal rate and regular rhythm.     Heart sounds: Normal heart sounds. No murmur heard. Pulmonary:     Effort: Pulmonary effort is normal. No respiratory distress.     Breath sounds: Normal breath sounds.  Chest:     Chest wall: No tenderness.  Abdominal:     Palpations:  Abdomen is soft.     Tenderness: There is no abdominal tenderness. There is no guarding or rebound.  Musculoskeletal:        General: Tenderness present. No swelling. Normal range of motion.     Cervical back: Normal range of motion and neck supple.     Comments: Ecchymosis right anterior lateral shoulder.  Ecchymosis bilateral knees with well-healed surgical incisions.  Abrasions and skin tears to bilateral arms in various stages of healing  Skin:    General: Skin is warm.  Neurological:     Mental Status: She is alert and oriented to person, place, and time.     Cranial Nerves: No cranial nerve deficit.     Motor: No abnormal muscle tone.     Coordination: Coordination normal.     Comments:  5/5 strength throughout. CN 2-12 intact.Equal grip strength.   Psychiatric:        Behavior: Behavior normal.    ED Results / Procedures / Treatments   Labs (all labs ordered are listed, but only abnormal results are displayed) Labs Reviewed  CBC WITH DIFFERENTIAL/PLATELET - Abnormal; Notable for the following components:      Result Value   RBC 3.65 (*)    Hemoglobin 10.9 (*)    HCT 33.5 (*)    Abs Immature Granulocytes 0.09 (*)    All other components within normal limits  COMPREHENSIVE METABOLIC PANEL - Abnormal; Notable for the following components:   Potassium 3.2 (*)    Chloride 112 (*)    Glucose, Bld 169 (*)    BUN 27 (*)    Creatinine, Ser 0.42 (*)    Total Protein 6.2 (*)    AST 54 (*)    All other components within normal limits  TROPONIN I (HIGH SENSITIVITY) - Abnormal; Notable for the following components:   Troponin I (High Sensitivity) 26 (*)    All  other components within normal limits  RESP PANEL BY RT-PCR (FLU A&B, COVID) ARPGX2  URINALYSIS, ROUTINE W REFLEX MICROSCOPIC  CK  TROPONIN I (HIGH SENSITIVITY)    EKG EKG Interpretation  Date/Time:  Sunday December 11 2020 05:37:55 EDT Ventricular Rate:  76 PR Interval:  190 QRS Duration: 78 QT  Interval:  436 QTC Calculation: 490 R Axis:   17 Text Interpretation: Sinus rhythm with occasional Premature ventricular complexes Prolonged QT Abnormal ECG No significant change was found Confirmed by Ezequiel Essex [phone]) on 12/11/2020 5:39:39 AM  Radiology DG Chest 2 View  Result Date: 12/11/2020 CLINICAL DATA:  Fall EXAM: CHEST - 2 VIEW COMPARISON:  None. FINDINGS: Normal cardiac silhouette. Atherosclerotic calcification of the aorta. Chronic bronchitic markings in lungs. Mild venous congestion. LEFT basilar atelectasis. No pulmonary contusion or pleural fluid. No pneumothorax. No fracture identified. Lateral projection there is a nodular density projecting superior to the hilum and inferior to the aortic arch measuring 12 mm. More diffuse density in lower lobe noted. IMPRESSION: No radiographic evidence of thoracic trauma. LEFT lower lobe atelectasis versus infiltrate. Nodular density on lateral projection not placed on frontal projection. Consider CT thorax to exclude pulmonary nodule. Electronically Signed   By: Suzy Bouchard M.D.   On: 12/11/2020 05:55   DG Pelvis 1-2 Views  Result Date: 12/11/2020 CLINICAL DATA:  Fall EXAM: PELVIS - 1-2 VIEW COMPARISON:  None. FINDINGS: Hips are located. No pelvic fracture or sacral fracture identified. Degenerative changes of the lumbar spine IMPRESSION: No evidence of hip fracture or pelvic fracture on single view Electronically Signed   By: Suzy Bouchard M.D.   On: 12/11/2020 05:56   DG Shoulder Right  Result Date: 12/11/2020 CLINICAL DATA:  Fall today. Unable to obtain axillary. fall EXAM: RIGHT SHOULDER - 2+ VIEW COMPARISON:  None. FINDINGS: Glenohumeral joint is intact. No evidence of scapular fracture or humeral fracture. The acromioclavicular joint is intact. IMPRESSION: No fracture or dislocation. Electronically Signed   By: Suzy Bouchard M.D.   On: 12/11/2020 05:50   CT Head Wo Contrast  Result Date: 12/11/2020 CLINICAL DATA:   Head trauma EXAM: CT HEAD WITHOUT CONTRAST CT MAXILLOFACIAL WITHOUT CONTRAST CT CERVICAL SPINE WITHOUT CONTRAST TECHNIQUE: Multidetector CT imaging of the head, cervical spine, and maxillofacial structures were performed using the standard protocol without intravenous contrast. Multiplanar CT image reconstructions of the cervical spine and maxillofacial structures were also generated. COMPARISON:  Head CT 09/25/2020 FINDINGS: CT HEAD FINDINGS Brain: A left superior cerebellar infarction has become apparent since prior, but well-defined, low-density, and chronic appearing. Pre-existing, small bilateral cerebellar infarcts. No evidence of acute infarct, hemorrhage, hydrocephalus, mass. Vascular: Atheromatous calcification. Heavy calcification of the vertebral arteries with prior aneurysm clipping on the left the a suboccipital approach. Skull: Unremarkable suboccipital craniotomy. CT MAXILLOFACIAL FINDINGS Osseous: Negative for fracture or mandibular dislocation. Orbits: No evidence of injury Sinuses: Negative for hemosinus Soft tissues: No acute finding CT CERVICAL SPINE FINDINGS Alignment: Normal Skull base and vertebrae: No acute fracture. No primary bone lesion or focal pathologic process. Soft tissues and spinal canal: No prevertebral fluid or swelling. No visible canal hematoma. Disc levels: Generalized degenerative disc narrowing and ridging with calcified disc bulging. Upper chest: Negative IMPRESSION: 1. No evidence of intracranial or cervical spine injury. 2. 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 3. Remote aneurysm clipping in the posterior fossa. Electronically Signed   By: Jorje Guild M.D.   On: 12/11/2020 06:21  CT Cervical Spine Wo Contrast  Result Date: 12/11/2020 CLINICAL DATA:  Head trauma EXAM: CT HEAD WITHOUT CONTRAST CT MAXILLOFACIAL WITHOUT CONTRAST CT CERVICAL SPINE WITHOUT CONTRAST TECHNIQUE: Multidetector CT imaging of the head,  cervical spine, and maxillofacial structures were performed using the standard protocol without intravenous contrast. Multiplanar CT image reconstructions of the cervical spine and maxillofacial structures were also generated. COMPARISON:  Head CT 09/25/2020 FINDINGS: CT HEAD FINDINGS Brain: A left superior cerebellar infarction has become apparent since prior, but well-defined, low-density, and chronic appearing. Pre-existing, small bilateral cerebellar infarcts. No evidence of acute infarct, hemorrhage, hydrocephalus, mass. Vascular: Atheromatous calcification. Heavy calcification of the vertebral arteries with prior aneurysm clipping on the left the a suboccipital approach. Skull: Unremarkable suboccipital craniotomy. CT MAXILLOFACIAL FINDINGS Osseous: Negative for fracture or mandibular dislocation. Orbits: No evidence of injury Sinuses: Negative for hemosinus Soft tissues: No acute finding CT CERVICAL SPINE FINDINGS Alignment: Normal Skull base and vertebrae: No acute fracture. No primary bone lesion or focal pathologic process. Soft tissues and spinal canal: No prevertebral fluid or swelling. No visible canal hematoma. Disc levels: Generalized degenerative disc narrowing and ridging with calcified disc bulging. Upper chest: Negative IMPRESSION: 1. No evidence of intracranial or cervical spine injury. 2. 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 3. Remote aneurysm clipping in the posterior fossa. Electronically Signed   By: Jorje Guild M.D.   On: 12/11/2020 06:21   DG Knee Complete 4 Views Left  Result Date: 12/11/2020 CLINICAL DATA:  Fall today EXAM: LEFT KNEE - COMPLETE 4+ VIEW COMPARISON:  09/25/2020 FINDINGS: Medial unicompartmental arthroplasty which is located. Degenerative spurring and joint narrowing elsewhere. Generalized osteopenia. Small if any joint fluid. IMPRESSION: No acute finding. Knee osteoarthritis with unicompartmental arthroplasty.  Electronically Signed   By: Jorje Guild M.D.   On: 12/11/2020 06:05   DG Knee Complete 4 Views Right  Result Date: 12/11/2020 CLINICAL DATA:  Fall today.fall EXAM: RIGHT KNEE - COMPLETE 4+ VIEW COMPARISON:  None. FINDINGS: No fracture of the proximal tibia or distal femur. Patella is normal. Total knee arthroplasty. No joint effusion. IMPRESSION: No acute osseous abnormality. Electronically Signed   By: Suzy Bouchard M.D.   On: 12/11/2020 06:02   CT Maxillofacial Wo Contrast  Result Date: 12/11/2020 CLINICAL DATA:  Head trauma EXAM: CT HEAD WITHOUT CONTRAST CT MAXILLOFACIAL WITHOUT CONTRAST CT CERVICAL SPINE WITHOUT CONTRAST TECHNIQUE: Multidetector CT imaging of the head, cervical spine, and maxillofacial structures were performed using the standard protocol without intravenous contrast. Multiplanar CT image reconstructions of the cervical spine and maxillofacial structures were also generated. COMPARISON:  Head CT 09/25/2020 FINDINGS: CT HEAD FINDINGS Brain: A left superior cerebellar infarction has become apparent since prior, but well-defined, low-density, and chronic appearing. Pre-existing, small bilateral cerebellar infarcts. No evidence of acute infarct, hemorrhage, hydrocephalus, mass. Vascular: Atheromatous calcification. Heavy calcification of the vertebral arteries with prior aneurysm clipping on the left the a suboccipital approach. Skull: Unremarkable suboccipital craniotomy. CT MAXILLOFACIAL FINDINGS Osseous: Negative for fracture or mandibular dislocation. Orbits: No evidence of injury Sinuses: Negative for hemosinus Soft tissues: No acute finding CT CERVICAL SPINE FINDINGS Alignment: Normal Skull base and vertebrae: No acute fracture. No primary bone lesion or focal pathologic process. Soft tissues and spinal canal: No prevertebral fluid or swelling. No visible canal hematoma. Disc levels: Generalized degenerative disc narrowing and ridging with calcified disc bulging. Upper chest:  Negative IMPRESSION: 1. No evidence of intracranial or cervical spine injury. 2. Left superior  cerebellar infarction which is chronic but has occurred since CT 2 months ago. 3. Remote aneurysm clipping in the posterior fossa. Electronically Signed   By: Jorje Guild M.D.   On: 12/11/2020 06:21    Procedures Procedures   Medications Ordered in ED Medications - No data to display  ED Course  I have reviewed the triage vital signs and the nursing notes.  Pertinent labs & imaging results that were available during my care of the patient were reviewed by me and considered in my medical decision making (see chart for details).    MDM Rules/Calculators/A&P                           Recurrent falls with multiple areas of contusion.  Lives alone and takes Eliquis.  Recently left rehab.  Vital stable.  No distress.  Traumatic imaging is reassuring.  CT head and C-spine are negative.  Does show chronic cerebellar infarction which is new since August.  X-rays of extremities are negative  Labs are at baseline.  Patient is maintaining sinus rhythm throughout ED course.  She is unable to stand and unable to ambulate.  She lives alone and apparently was discharged to rehab.  She appears unsafe to go home especially with her anticoagulation use.  Discussed with hospitalist Dr. Marylyn Ishihara who feels there is no criteria for admission but agrees that she is unsafe to go home by herself.  Will involve social work and Tourist information centre manager as well as PT and OT. Holding orders placed. Second troponin and urinalysis pending at shift change.  Dr. Francia Greaves to assume care.  Final Clinical Impression(s) / ED Diagnoses Final diagnoses:  None    Rx / DC Orders ED Discharge Orders     None        Taler Kushner, Annie Main, MD 12/11/20 [phone]

## 2020-12-11 NOTE — ED Notes (Signed)
Pt med rec is not complete. Called pharm at 10 am and they stated to retime the medications. Just called pharmacy again and they stated that we need to push meds further into tonight until the tech get the meds reconciled. Per wendy in the pharmacy

## 2020-12-11 NOTE — ED Notes (Signed)
Pt family would like for you to call the son

## 2020-12-11 NOTE — ED Notes (Signed)
Pt care taken, no complaints at this time. 

## 2020-12-11 NOTE — Progress Notes (Signed)
SNF referrals will be sent once PT recommendations are entered.

## 2020-12-11 NOTE — ED Notes (Signed)
Pt will not get up to walk.

## 2020-12-11 NOTE — ED Notes (Signed)
Called lab, they cannot find the gold or light green tube. Abby RN aware.

## 2020-12-11 NOTE — ED Notes (Signed)
Pt ate both meals and has been resting with no issues

## 2020-12-11 NOTE — ED Provider Notes (Signed)
Patient seen by SW.   Patient consents to placement.    Valarie Merino, MD 12/11/20 [phone]

## 2020-12-11 NOTE — Evaluation (Signed)
Physical Therapy Evaluation Patient Details Name: Kristina Wilkinson MRN: [id number] DOB: 1927/04/04 Today's Date: 12/11/2020  History of Present Illness  Pt admitted from home 2* multiple falls, weakness and inability to get to bathroom or self care. Pt with hx of DM, LBP, L TKR, cerebral aneurysm repair, peripheral neuropathy, and multiple falls.  Pt with noted FOF stating multiple times, "I don't want to get up so I don't fall again"  Clinical Impression  Pt admitted as above and presenting with functional mobility limitations 2* generalized weakness, balance deficits, and marked FOF.  Pt would benefit from follow up rehab at SNF level to maximize IND and safety.     Recommendations for follow up therapy are one component of a multi-disciplinary discharge planning process, led by the attending physician.  Recommendations may be updated based on patient status, additional functional criteria and insurance authorization.  Follow Up Recommendations Skilled nursing-short term rehab (<3 hours/day)    Assistance Recommended at Discharge Frequent or constant Supervision/Assistance  Functional Status Assessment Patient has had a recent decline in their functional status and demonstrates the ability to make significant improvements in function in a reasonable and predictable amount of time.  Equipment Recommendations  None recommended by PT    Recommendations for Other Services       Precautions / Restrictions Precautions Precautions: Fall Restrictions Weight Bearing Restrictions: No      Mobility  Bed Mobility Overal bed mobility: Needs Assistance Bed Mobility: Supine to Sit;Sit to Supine     Supine to sit: Mod assist Sit to supine: Mod assist   General bed mobility comments: Increased time, use of bed rail, physical assist to bring trunk to upright and to manage LEs on/off bed    Transfers Overall transfer level: Needs assistance Equipment used: Rolling walker (2  wheels) Transfers: Sit to/from Stand Sit to Stand: Mod assist;From elevated surface;+2 safety/equipment;+2 physical assistance           General transfer comment: Pt stood x 2 with assist to bring wt up and fwd and to balance in standing with RW    Ambulation/Gait             General Gait Details: Pt stood x 2 only,  Unable/unwilling to attempt step or march in place 2* c/o weakness and FOF  Stairs            Wheelchair Mobility    Modified Rankin (Stroke Patients Only)       Balance Overall balance assessment: Needs assistance Sitting-balance support: No upper extremity supported;Feet supported Sitting balance-Leahy Scale: Fair     Standing balance support: Bilateral upper extremity supported Standing balance-Leahy Scale: Poor                               Pertinent Vitals/Pain Pain Assessment: No/denies pain    Home Living Family/patient expects to be discharged to:: Private residence   Available Help at Discharge: Family;Friend(s);Available PRN/intermittently Type of Home: House Home Access: Stairs to enter Entrance Stairs-Rails: Right;Left;Can reach both Entrance Stairs-Number of Steps: 3   Home Layout: One level Home Equipment: Conservation officer, nature (2 wheels);Cane - single point;Shower seat - built in;Grab bars - tub/shower      Prior Function Prior Level of Function : History of Falls (last six months);Patient poor historian/Family not available;Needs assist             Mobility Comments: Pt had been in rehab in September and returned  home Mod I with RW but recently with multiple falls and now with FOF and unable to get to bathroom or self care       Hand Dominance   Dominant Hand: Right    Extremity/Trunk Assessment   Upper Extremity Assessment Upper Extremity Assessment: Generalized weakness    Lower Extremity Assessment Lower Extremity Assessment: Generalized weakness    Cervical / Trunk Assessment Cervical / Trunk  Assessment: Kyphotic  Communication   Communication: HOH  Cognition Arousal/Alertness: Awake/alert Behavior During Therapy: Impulsive;Anxious Overall Cognitive Status: No family/caregiver present to determine baseline cognitive functioning                                          General Comments      Exercises     Assessment/Plan    PT Assessment Patient needs continued PT services  PT Problem List Decreased strength;Decreased activity tolerance;Decreased balance;Decreased mobility;Decreased cognition;Decreased knowledge of use of DME;Decreased safety awareness       PT Treatment Interventions DME instruction;Gait training;Functional mobility training;Therapeutic activities;Therapeutic exercise;Balance training;Cognitive remediation;Patient/family education    PT Goals (Current goals can be found in the Care Plan section)  Acute Rehab PT Goals Patient Stated Goal: Not fall again PT Goal Formulation: With patient Time For Goal Achievement: 12/25/20 Potential to Achieve Goals: Fair    Frequency Min 2X/week   Barriers to discharge Decreased caregiver support Pt lives alone    Co-evaluation               AM-PAC PT "6 Clicks" Mobility  Outcome Measure Help needed turning from your back to your side while in a flat bed without using bedrails?: A Lot Help needed moving from lying on your back to sitting on the side of a flat bed without using bedrails?: A Lot Help needed moving to and from a bed to a chair (including a wheelchair)?: A Lot Help needed standing up from a chair using your arms (e.g., wheelchair or bedside chair)?: A Lot Help needed to walk in hospital room?: A Lot Help needed climbing 3-5 steps with a railing? : Total 6 Click Score: 11    End of Session Equipment Utilized During Treatment: Gait belt Activity Tolerance: Patient limited by fatigue Patient left: in bed;with call bell/phone within reach;with bed alarm set Nurse  Communication: Mobility status PT Visit Diagnosis: Unsteadiness on feet (R26.81);Muscle weakness (generalized) (M62.81);Repeated falls (R29.6);Difficulty in walking, not elsewhere classified (R26.2)    Time: 5409-8119 PT Time Calculation (min) (ACUTE ONLY): 15 min   Charges:   PT Evaluation $PT Eval Low Complexity: 1 Low          Debe Coder PT Acute Rehabilitation Services Pager [phone] Office [phone]   Capitola Ladson 12/11/2020, 3:51 PM

## 2020-12-11 NOTE — ED Triage Notes (Signed)
Pt is disheveled, has bruises and skin tears  of varying stages of healing on arms and legs, swollen legs.

## 2020-12-11 NOTE — Progress Notes (Signed)
CSW spoke with [name] who is patients distant cousin. Vevelyn Royals stated patient fell on Wednesday and he found her in a pool of blood by the refrigerator. Vevelyn Royals stated that patient refused to be transported to the emergency room. Vevelyn Royals stated she has been sitting in her recliner chair since Wednesday night. Vevelyn Royals stated he checks on patient every morning and afternoon and also pays the bills. Vevelyn Royals stated patient has been unable to walk. Patient spent 3 weeks at [facility] at the end of August. Britt stated that she was up walking around and doing well so they discharged her. Vevelyn Royals stated that patient was sent home with home health but she will tell them that she doesn't need it or will not let anyone in her home. CSW also explained the insurance issue and patient could be in copay days and might have to pay out of pocket if SNF is recommended. Vevelyn Royals stated patient has the money to pay daily. Patient does not qualify for Medicaid long term care due to having too much money. PT consult pending.

## 2020-12-11 NOTE — NC FL2 (Signed)
[place] MEDICAID FL2 LEVEL OF CARE SCREENING TOOL     IDENTIFICATION  Patient Name: Kristina Wilkinson Birthdate: [birth year] Sex: female Admission Date (Current Location): 12/11/2020  [facility] and [place] Number:  Herbalist and Address:  [facility],  [place], [place]      Provider Number: [id number]  Attending Physician Name and Address:  Default, Provider, MD  Relative Name and Phone Number:  [name], [phone]    Current Level of Care: SNF Recommended Level of Care: [place] Prior Approval Number:    Date Approved/Denied:   PASRR Number: [id number]  Discharge Plan: SNF    Current Diagnoses: Patient Active Problem List   Diagnosis Date Noted   New onset a-fib ([place]) 09/25/2020   Restless leg syndrome 03/14/2019   Sepsis due to gram-negative UTI ([place]) 03/06/2019   Sepsis due to urinary tract infection ([place]) 03/05/2019   Falls 03/05/2019   Open toe wound 12/01/2018   Traumatic rhabdomyolysis ([place]) 11/30/2018   Cellulitis 11/30/2018   Transaminitis 11/30/2018   Abnormal urinalysis 11/30/2018   Leukocytosis 11/30/2018   Hypokalemia 11/30/2018   Scalp laceration    Skull fracture ([place]) 07/13/2016   Chronic left shoulder pain 04/03/2016   Diabetes mellitus without complication ([place]) 08/65/7846   Hearing loss 06/21/2015   Advance care planning 12/17/2014   Palpitations 07/06/2014   Pain in joint, ankle and foot 04/20/2014   DVT, lower extremity ([place]) 11/19/2013   Edema 10/20/2013   Headache(784.0) 10/20/2013   Left leg swelling 10/20/2013   Aneurysm of basilar artery (HCC) 10/20/2013   Bradycardia 10/20/2013   Vitamin B12 deficiency 07/16/2013   Paresthesia of both feet 05/17/2013   TMJ arthritis 09/11/2012   Ear pain 09/11/2012   History of transfusion of packed red blood cells 09/11/2012   Spinal stenosis 04/13/2011   Well adult exam 01/23/2011   Cholecystitis with cholelithiasis, s/p lap  chole 11/10/2010 12/01/2010   Shoulder pain, right 07/25/2010   TOBACCO USE, QUIT 03/09/2009   KNEE PAIN 05/18/2008   PN (peripheral neuropathy) 08/19/2007   LEG PAIN 08/19/2007   Vitamin D deficiency 02/17/2007   INSOMNIA, PERSISTENT 02/17/2007   HTN (hypertension) 02/17/2007   GERD 02/17/2007   Osteoarthritis 02/17/2007   LOW BACK PAIN 02/17/2007   Hyperglycemia 01/28/2007   UNSPECIFIED OSTEOPOROSIS 01/28/2007   PRECIPITOUS DROP IN HEMATOCRIT 01/28/2007    Orientation RESPIRATION BLADDER Height & Weight     Self, Time, Situation, Place  Normal Incontinent Weight: 165 lb (74.8 kg) Height:  5\' 6"  (167.6 cm)  BEHAVIORAL SYMPTOMS/MOOD NEUROLOGICAL BOWEL NUTRITION STATUS      Continent    AMBULATORY STATUS COMMUNICATION OF NEEDS Skin   Extensive Assist Verbally Bruising (Patient fell at home)                       [place] Level of Assistance  Bathing, Feeding, Dressing Bathing Assistance: Maximum assistance Feeding assistance: Limited assistance Dressing Assistance: Maximum assistance     Functional Limitations Info  Hearing, Sight, Speech Sight Info: Adequate Hearing Info: Impaired Speech Info: Adequate    SPECIAL CARE FACTORS FREQUENCY                       Contractures Contractures Info: Not present    Additional Factors Info  Code Status, Allergies Code Status Info: Prior full code Allergies Info: Gabapentin, Penicillins, Other, Pregabalin, Vit D-vit E-safflower Oil  Current Medications (12/11/2020):  This is the current hospital active medication list Current Facility-Administered Medications  Medication Dose Route Frequency Provider Last Rate Last Admin   albuterol (PROVENTIL) (2.5 MG/3ML) 0.083% nebulizer solution 2.5 mg  2.5 mg Nebulization Q4H PRN Rancour, Annie Main, MD       amLODipine (NORVASC) tablet 10 mg  10 mg Oral Daily Rancour, Annie Main, MD       apixaban Arne [place]) tablet 5 mg  5 mg Oral BID Rancour, Stephen,  MD       insulin aspart (novoLOG) injection 0-9 Units  0-9 Units Subcutaneous TID WC Rancour, Stephen, MD   1 Units at 12/11/20 1253   lisinopril (ZESTRIL) tablet 40 mg  40 mg Oral Daily Rancour, Stephen, MD       metoprolol succinate (TOPROL-XL) 24 hr tablet 12.5 mg  12.5 mg Oral Daily Rancour, Stephen, MD       senna-docusate (Senokot-S) tablet 1 tablet  1 tablet Oral BID Rancour, Stephen, MD       Current Outpatient Medications  Medication Sig Dispense Refill   acetaminophen (TYLENOL) 325 MG tablet Take 2 tablets (650 mg total) by mouth every 6 (six) hours as needed for mild pain (or Fever >/= 101). 20 tablet    albuterol (PROVENTIL) (2.5 MG/3ML) 0.083% nebulizer solution Take 3 mLs (2.5 mg total) by nebulization every 4 (four) hours as needed for wheezing or shortness of breath. 75 mL 12   amLODipine (NORVASC) 10 MG tablet Take 1 tablet (10 mg total) by mouth daily.     apixaban (ELIQUIS) 5 MG TABS tablet Take 1 tablet (5 mg total) by mouth 2 (two) times daily. 60 tablet    bisacodyl (DULCOLAX) 10 MG suppository Place 1 suppository (10 mg total) rectally daily as needed for moderate constipation. 12 suppository 0   lisinopril (ZESTRIL) 40 MG tablet Take 1 tablet (40 mg total) by mouth daily.     metoprolol succinate (TOPROL-XL) 25 MG 24 hr tablet Take 0.5 tablets (12.5 mg total) by mouth daily.     ondansetron (ZOFRAN) 4 MG tablet Take 1 tablet (4 mg total) by mouth every 6 (six) hours as needed for nausea. 20 tablet 0   polyethylene glycol (MIRALAX / GLYCOLAX) 17 g packet Take 17 g by mouth 2 (two) times daily. 14 each 0   polyvinyl alcohol (LIQUIFILM TEARS) 1.4 % ophthalmic solution Place 1 drop into both eyes as needed for dry eyes. 15 mL 0   senna-docusate (SENOKOT-S) 8.6-50 MG tablet Take 1 tablet by mouth 2 (two) times daily.       Discharge Medications: Please see discharge summary for a list of discharge medications.  Relevant Imaging Results:  Relevant Lab  Results:   Additional Information SSN [id number]. Insulin Sliding Scale Info: insulin aspart (novoLOG) injection 0-9 Units 3x a day with meals  Raina Mina, LCSWA

## 2020-12-11 NOTE — Progress Notes (Signed)
CSW spoke with patient to see if she would be willing to go to a SNF if recommended. Patient had a hard time hearing so CSW wrote down the conversation. Patient stated she did not want to go to rehab and stated she already did that in [place]. CSW tried to explain to patient that she will need rehab in order to walk. Patient stated probably. Patient stated she came here to get a check up and not go to rehab. CSW again stated she will need rehab to walk. Patient stated "We will see what happens" Patient would not agree to go to rehab.

## 2020-12-12 DIAGNOSIS — I4891 Unspecified atrial fibrillation: Secondary | ICD-10-CM | POA: Diagnosis not present

## 2020-12-12 DIAGNOSIS — S8001XA Contusion of right knee, initial encounter: Secondary | ICD-10-CM | POA: Diagnosis not present

## 2020-12-12 DIAGNOSIS — Z743 Need for continuous supervision: Secondary | ICD-10-CM | POA: Diagnosis not present

## 2020-12-12 DIAGNOSIS — G2581 Restless legs syndrome: Secondary | ICD-10-CM | POA: Diagnosis not present

## 2020-12-12 DIAGNOSIS — H9190 Unspecified hearing loss, unspecified ear: Secondary | ICD-10-CM | POA: Diagnosis not present

## 2020-12-12 DIAGNOSIS — R278 Other lack of coordination: Secondary | ICD-10-CM | POA: Diagnosis not present

## 2020-12-12 DIAGNOSIS — G4700 Insomnia, unspecified: Secondary | ICD-10-CM | POA: Diagnosis not present

## 2020-12-12 DIAGNOSIS — E119 Type 2 diabetes mellitus without complications: Secondary | ICD-10-CM | POA: Diagnosis not present

## 2020-12-12 DIAGNOSIS — R404 Transient alteration of awareness: Secondary | ICD-10-CM | POA: Diagnosis not present

## 2020-12-12 DIAGNOSIS — K219 Gastro-esophageal reflux disease without esophagitis: Secondary | ICD-10-CM | POA: Diagnosis not present

## 2020-12-12 DIAGNOSIS — Z20822 Contact with and (suspected) exposure to covid-19: Secondary | ICD-10-CM | POA: Diagnosis not present

## 2020-12-12 DIAGNOSIS — R41841 Cognitive communication deficit: Secondary | ICD-10-CM | POA: Diagnosis not present

## 2020-12-12 DIAGNOSIS — S51811A Laceration without foreign body of right forearm, initial encounter: Secondary | ICD-10-CM | POA: Diagnosis not present

## 2020-12-12 DIAGNOSIS — I10 Essential (primary) hypertension: Secondary | ICD-10-CM | POA: Diagnosis not present

## 2020-12-12 DIAGNOSIS — R262 Difficulty in walking, not elsewhere classified: Secondary | ICD-10-CM | POA: Diagnosis not present

## 2020-12-12 DIAGNOSIS — R2681 Unsteadiness on feet: Secondary | ICD-10-CM | POA: Diagnosis not present

## 2020-12-12 DIAGNOSIS — S40011A Contusion of right shoulder, initial encounter: Secondary | ICD-10-CM | POA: Diagnosis not present

## 2020-12-12 DIAGNOSIS — Z741 Need for assistance with personal care: Secondary | ICD-10-CM | POA: Diagnosis not present

## 2020-12-12 DIAGNOSIS — S8002XA Contusion of left knee, initial encounter: Secondary | ICD-10-CM | POA: Diagnosis not present

## 2020-12-12 DIAGNOSIS — M6281 Muscle weakness (generalized): Secondary | ICD-10-CM | POA: Diagnosis not present

## 2020-12-12 DIAGNOSIS — S51812A Laceration without foreign body of left forearm, initial encounter: Secondary | ICD-10-CM | POA: Diagnosis not present

## 2020-12-12 DIAGNOSIS — M4800 Spinal stenosis, site unspecified: Secondary | ICD-10-CM | POA: Diagnosis not present

## 2020-12-12 DIAGNOSIS — M6259 Muscle wasting and atrophy, not elsewhere classified, multiple sites: Secondary | ICD-10-CM | POA: Diagnosis not present

## 2020-12-12 LAB — CBG MONITORING, ED: Glucose-Capillary: 178 mg/dL — ABNORMAL HIGH (ref 70–99)

## 2020-12-12 MED ORDER — ACETAMINOPHEN 500 MG PO TABS
1000.0000 mg | ORAL_TABLET | Freq: Four times a day (QID) | ORAL | Status: DC | PRN
  Administered 2020-12-12: 1000 mg via ORAL
  Filled 2020-12-12: qty 2

## 2020-12-12 NOTE — Discharge Instructions (Signed)
Continue all home medications.  Tylenol as needed for pain.

## 2020-12-12 NOTE — ED Notes (Signed)
[facility] here to transport to [facility] pt condition stable . Dressing applied to two skin tears left forearm r/t tape removal at IV site. #22 angio-cath removed intact but removal of durapor tape caused 1CM x 1cm skin tears to area surround iv site small amount serosanginous drainage noted. Site cleaned with NSS and 4x4 sterile gauze then tegaderm  dressing applied . No further drainage noted pt denies further pain.

## 2020-12-12 NOTE — ED Provider Notes (Signed)
Emergency Medicine Observation Re-evaluation Note  Kristina Wilkinson is a 87 y.o. female, seen on rounds today.  Pt initially presented to the ED for complaints of Fall (Pt lives alone, has had 3 falls this week, is not able to get up to go to the bathroom so was found with urine on her upon ems arrival, has multiple skin tears of varying stages of healing, denies any pain from the fall today.) Currently, the patient is sitting in bed reading some material on facilities.  Physical Exam  BP (!) 135/57 (BP Location: Right Arm)   Pulse 81   Temp 98.3 F (36.8 C) (Oral)   Resp 16   Ht 5\' 6"  (1.676 m)   Wt 74.8 kg   SpO2 94%   BMI 26.63 kg/m  Physical Exam General: No acute distress Cardiac: Regular rate Lungs: Clear to auscultation bilaterally MSK: Healing abrasions over the left arm without surrounding erythema or drainage.  Diffuse areas of bruising over the bilateral lower extremities but no open wounds.  ED Course / MDM  EKG:EKG Interpretation  Date/Time:  Sunday December 11 2020 05:37:55 EDT Ventricular Rate:  76 PR Interval:  190 QRS Duration: 78 QT Interval:  436 QTC Calculation: 490 R Axis:   17 Text Interpretation: Sinus rhythm with occasional Premature ventricular complexes Prolonged QT Abnormal ECG No significant change was found Confirmed by Ezequiel Essex [phone]) on 12/11/2020 5:39:39 AM  I have reviewed the labs performed to date as well as medications administered while in observation.  Recent changes in the last 24 hours include patient consented for placement.  Plan  Current plan is for patient and family have consented to [facility].  Waiting for approval.  Carmelina Dane, MD 12/12/20 [phone]

## 2020-12-12 NOTE — Progress Notes (Signed)
Pt is refusing to sign Admission paper work, pt's cousin [name] will come assist pt with signing admission paperwork. TOC continue to follow.  Arlie Solomons.Meagen Limones, MSW, [place]  Transitions of Care Clinical Social Worker I Direct Dial: [phone]  Fax: [phone] [email or web address]

## 2020-12-12 NOTE — Progress Notes (Addendum)
CSW spoke with pt and provided pt with facilities that offer pt a bed. Pt and family chose [facility]. CSW spoke with Narda Rutherford from [facility], she reported submitting for approval to their regional for [employer]. CSW continue to follow.      [place]  12:30 Janie from [facility] reported pt was approved, Room # 3238 call report [phone], Corey Harold was asked to be called at 2:30pm.  CSW contact pt's family to inform them no answer left HIPPA compliant VM. CSW to fax pt's AVS to [facility].Secilia Apps, MSW, [place]  Transitions of Care Clinical Social Worker I Direct Dial: [phone]  Fax: [phone] [email or web address]

## 2020-12-12 NOTE — Progress Notes (Addendum)
6:25pm: CSW spoke with Narda Rutherford at [employer] who confirms Kristina Wilkinson signed the admissions paperwork.  CSW returned call to [place] will come pick up the patient next due to the time constraint.  5pm: CSW spoke with [facility] - patient is #6 on the list.  CSW spoke with patient's cousin Kristina Wilkinson who states the patient is not signing the paperwork due to her being "cantankerous." Kristina Wilkinson agreeable to go to [facility] now to sign documents.  CSW spoke with Narda Rutherford to inform her of information. Patient cannot be transferred to the facility after 8pm.  RN updated on plan.  Madilyn Fireman, MSW, LCSW Transitions of Care  Clinical Social Worker II [phone]

## 2020-12-13 DIAGNOSIS — R269 Unspecified abnormalities of gait and mobility: Secondary | ICD-10-CM | POA: Diagnosis not present

## 2020-12-13 DIAGNOSIS — E46 Unspecified protein-calorie malnutrition: Secondary | ICD-10-CM | POA: Diagnosis not present

## 2020-12-13 DIAGNOSIS — I729 Aneurysm of unspecified site: Secondary | ICD-10-CM | POA: Diagnosis not present

## 2020-12-13 DIAGNOSIS — G4700 Insomnia, unspecified: Secondary | ICD-10-CM | POA: Diagnosis not present

## 2020-12-13 DIAGNOSIS — M1990 Unspecified osteoarthritis, unspecified site: Secondary | ICD-10-CM | POA: Diagnosis not present

## 2020-12-13 DIAGNOSIS — E119 Type 2 diabetes mellitus without complications: Secondary | ICD-10-CM | POA: Diagnosis not present

## 2020-12-13 DIAGNOSIS — G629 Polyneuropathy, unspecified: Secondary | ICD-10-CM | POA: Diagnosis not present

## 2020-12-13 DIAGNOSIS — S0291XA Unspecified fracture of skull, initial encounter for closed fracture: Secondary | ICD-10-CM | POA: Diagnosis not present

## 2020-12-13 DIAGNOSIS — I4891 Unspecified atrial fibrillation: Secondary | ICD-10-CM | POA: Diagnosis not present

## 2020-12-13 DIAGNOSIS — W19XXXD Unspecified fall, subsequent encounter: Secondary | ICD-10-CM | POA: Diagnosis not present

## 2020-12-13 DIAGNOSIS — K2100 Gastro-esophageal reflux disease with esophagitis, without bleeding: Secondary | ICD-10-CM | POA: Diagnosis not present

## 2020-12-13 DIAGNOSIS — I480 Paroxysmal atrial fibrillation: Secondary | ICD-10-CM | POA: Diagnosis not present

## 2020-12-13 DIAGNOSIS — I10 Essential (primary) hypertension: Secondary | ICD-10-CM | POA: Diagnosis not present

## 2020-12-13 DIAGNOSIS — R110 Nausea: Secondary | ICD-10-CM | POA: Diagnosis not present

## 2020-12-13 DIAGNOSIS — M545 Low back pain, unspecified: Secondary | ICD-10-CM | POA: Diagnosis not present

## 2020-12-13 DIAGNOSIS — K5900 Constipation, unspecified: Secondary | ICD-10-CM | POA: Diagnosis not present

## 2020-12-15 DIAGNOSIS — L98499 Non-pressure chronic ulcer of skin of other sites with unspecified severity: Secondary | ICD-10-CM | POA: Diagnosis not present

## 2020-12-16 DIAGNOSIS — G629 Polyneuropathy, unspecified: Secondary | ICD-10-CM | POA: Diagnosis not present

## 2020-12-16 DIAGNOSIS — E119 Type 2 diabetes mellitus without complications: Secondary | ICD-10-CM | POA: Diagnosis not present

## 2020-12-16 DIAGNOSIS — I10 Essential (primary) hypertension: Secondary | ICD-10-CM | POA: Diagnosis not present

## 2020-12-16 DIAGNOSIS — I4891 Unspecified atrial fibrillation: Secondary | ICD-10-CM | POA: Diagnosis not present

## 2020-12-19 ENCOUNTER — Other Ambulatory Visit: Payer: Self-pay | Admitting: *Deleted

## 2020-12-19 NOTE — Patient Outreach (Addendum)
Member screened for potential [facility] Care Management needs. Kristina Wilkinson resides in [facility] SN[facility].   Attempted to reach [facility] SN[facility] SW by phone. Voicemail box full. Unable to leave message. Communication sent to SNF SW to inquire about Kristina Wilkinson's transition plans.   Will continue to follow while member resides in SNF.   Addendum: Telephone call received from [place], [facility] SN[facility] SW indicating member is adamant to return home. States patient is alert and oriented and is able to make decisions. States patient's cousin Kristina Wilkinson is primary contact and support for Kristina Wilkinson. Writer will plan outreach to cousin Kristina Wilkinson.   [name], MSN, RN,BSN [place] Acute Care Coordinator [phone] [facility]) [phone]  (Toll free office)

## 2020-12-20 DIAGNOSIS — G629 Polyneuropathy, unspecified: Secondary | ICD-10-CM | POA: Diagnosis not present

## 2020-12-20 DIAGNOSIS — I4891 Unspecified atrial fibrillation: Secondary | ICD-10-CM | POA: Diagnosis not present

## 2020-12-20 DIAGNOSIS — G3184 Mild cognitive impairment, so stated: Secondary | ICD-10-CM | POA: Diagnosis not present

## 2020-12-20 DIAGNOSIS — F4320 Adjustment disorder, unspecified: Secondary | ICD-10-CM | POA: Diagnosis not present

## 2020-12-20 DIAGNOSIS — F5102 Adjustment insomnia: Secondary | ICD-10-CM | POA: Diagnosis not present

## 2020-12-20 DIAGNOSIS — M1990 Unspecified osteoarthritis, unspecified site: Secondary | ICD-10-CM | POA: Diagnosis not present

## 2020-12-20 DIAGNOSIS — I10 Essential (primary) hypertension: Secondary | ICD-10-CM | POA: Diagnosis not present

## 2020-12-22 ENCOUNTER — Other Ambulatory Visit: Payer: Self-pay | Admitting: *Deleted

## 2020-12-22 DIAGNOSIS — G629 Polyneuropathy, unspecified: Secondary | ICD-10-CM | POA: Diagnosis not present

## 2020-12-22 DIAGNOSIS — L98499 Non-pressure chronic ulcer of skin of other sites with unspecified severity: Secondary | ICD-10-CM | POA: Diagnosis not present

## 2020-12-22 DIAGNOSIS — F4320 Adjustment disorder, unspecified: Secondary | ICD-10-CM | POA: Diagnosis not present

## 2020-12-22 DIAGNOSIS — I4891 Unspecified atrial fibrillation: Secondary | ICD-10-CM | POA: Diagnosis not present

## 2020-12-22 DIAGNOSIS — M1990 Unspecified osteoarthritis, unspecified site: Secondary | ICD-10-CM | POA: Diagnosis not present

## 2020-12-22 DIAGNOSIS — I10 Essential (primary) hypertension: Secondary | ICD-10-CM | POA: Diagnosis not present

## 2020-12-22 NOTE — Patient Outreach (Signed)
[facility] Post- Acute Care Coordinator follow up. Kristina Wilkinson resides in [facility] SNF. Screened for potential [facility] Care Management needs.   Telephone call made to Kristina Wilkinson to discuss [place] Management follow up at (mobile) [phone]. Unable to reach due to phone being a non-working number. Left HIPAA compliant message on home phone [phone].   Telephone call made to cousin Kristina Wilkinson [phone] who confirms Kristina Wilkinson remains in [facility] and is unsure of when she is returning home. Kristina Wilkinson indicates Kristina Wilkinson does not have a mobile phone.   Writer will continue to follow for transition date and plans. Will outreach to Kristina Wilkinson accordingly. Will continue to follow up with SNF SW.    Kristina Rolling, MSN, Kristina Wilkinson,Kristina Wilkinson [place] Acute Care Coordinator [phone] [facility]) [phone]  (Toll free office)

## 2020-12-28 DIAGNOSIS — I4891 Unspecified atrial fibrillation: Secondary | ICD-10-CM | POA: Diagnosis not present

## 2020-12-28 DIAGNOSIS — I10 Essential (primary) hypertension: Secondary | ICD-10-CM | POA: Diagnosis not present

## 2020-12-28 DIAGNOSIS — E119 Type 2 diabetes mellitus without complications: Secondary | ICD-10-CM | POA: Diagnosis not present

## 2020-12-28 DIAGNOSIS — M1990 Unspecified osteoarthritis, unspecified site: Secondary | ICD-10-CM | POA: Diagnosis not present

## 2021-01-03 DIAGNOSIS — G629 Polyneuropathy, unspecified: Secondary | ICD-10-CM | POA: Diagnosis not present

## 2021-01-03 DIAGNOSIS — R110 Nausea: Secondary | ICD-10-CM | POA: Diagnosis not present

## 2021-01-03 DIAGNOSIS — E119 Type 2 diabetes mellitus without complications: Secondary | ICD-10-CM | POA: Diagnosis not present

## 2021-01-03 DIAGNOSIS — I4891 Unspecified atrial fibrillation: Secondary | ICD-10-CM | POA: Diagnosis not present

## 2021-01-03 DIAGNOSIS — I10 Essential (primary) hypertension: Secondary | ICD-10-CM | POA: Diagnosis not present

## 2021-01-03 DIAGNOSIS — M1990 Unspecified osteoarthritis, unspecified site: Secondary | ICD-10-CM | POA: Diagnosis not present

## 2021-01-06 DIAGNOSIS — M545 Low back pain, unspecified: Secondary | ICD-10-CM | POA: Diagnosis not present

## 2021-01-06 DIAGNOSIS — I10 Essential (primary) hypertension: Secondary | ICD-10-CM | POA: Diagnosis not present

## 2021-01-06 DIAGNOSIS — I4891 Unspecified atrial fibrillation: Secondary | ICD-10-CM | POA: Diagnosis not present

## 2021-01-06 DIAGNOSIS — G629 Polyneuropathy, unspecified: Secondary | ICD-10-CM | POA: Diagnosis not present

## 2021-01-06 DIAGNOSIS — M1990 Unspecified osteoarthritis, unspecified site: Secondary | ICD-10-CM | POA: Diagnosis not present

## 2021-01-09 DIAGNOSIS — I10 Essential (primary) hypertension: Secondary | ICD-10-CM | POA: Diagnosis not present

## 2021-01-09 DIAGNOSIS — G629 Polyneuropathy, unspecified: Secondary | ICD-10-CM | POA: Diagnosis not present

## 2021-01-09 DIAGNOSIS — I4891 Unspecified atrial fibrillation: Secondary | ICD-10-CM | POA: Diagnosis not present

## 2021-01-09 DIAGNOSIS — M545 Low back pain, unspecified: Secondary | ICD-10-CM | POA: Diagnosis not present

## 2021-01-09 DIAGNOSIS — M1990 Unspecified osteoarthritis, unspecified site: Secondary | ICD-10-CM | POA: Diagnosis not present

## 2021-01-11 DIAGNOSIS — I4891 Unspecified atrial fibrillation: Secondary | ICD-10-CM | POA: Diagnosis not present

## 2021-01-11 DIAGNOSIS — I10 Essential (primary) hypertension: Secondary | ICD-10-CM | POA: Diagnosis not present

## 2021-01-11 DIAGNOSIS — G629 Polyneuropathy, unspecified: Secondary | ICD-10-CM | POA: Diagnosis not present

## 2021-01-11 DIAGNOSIS — F419 Anxiety disorder, unspecified: Secondary | ICD-10-CM | POA: Diagnosis not present

## 2021-01-11 DIAGNOSIS — M1990 Unspecified osteoarthritis, unspecified site: Secondary | ICD-10-CM | POA: Diagnosis not present

## 2021-01-12 ENCOUNTER — Encounter (HOSPITAL_COMMUNITY): Payer: Self-pay | Admitting: *Deleted

## 2021-01-12 ENCOUNTER — Other Ambulatory Visit: Payer: Self-pay

## 2021-01-12 ENCOUNTER — Emergency Department (HOSPITAL_COMMUNITY)
Admission: EM | Admit: 2021-01-12 | Discharge: 2021-01-13 | Disposition: A | Discharge status: Home / Self Care | Payer: Medicare Other | Attending: Emergency Medicine | Admitting: Emergency Medicine

## 2021-01-12 ENCOUNTER — Other Ambulatory Visit: Payer: Self-pay | Admitting: *Deleted

## 2021-01-12 ENCOUNTER — Emergency Department (HOSPITAL_COMMUNITY): Payer: Medicare Other

## 2021-01-12 DIAGNOSIS — Z79899 Other long term (current) drug therapy: Secondary | ICD-10-CM | POA: Diagnosis not present

## 2021-01-12 DIAGNOSIS — R404 Transient alteration of awareness: Secondary | ICD-10-CM | POA: Diagnosis not present

## 2021-01-12 DIAGNOSIS — N179 Acute kidney failure, unspecified: Secondary | ICD-10-CM | POA: Diagnosis not present

## 2021-01-12 DIAGNOSIS — Z96652 Presence of left artificial knee joint: Secondary | ICD-10-CM | POA: Diagnosis not present

## 2021-01-12 DIAGNOSIS — Z20822 Contact with and (suspected) exposure to covid-19: Secondary | ICD-10-CM | POA: Insufficient documentation

## 2021-01-12 DIAGNOSIS — I10 Essential (primary) hypertension: Secondary | ICD-10-CM | POA: Diagnosis not present

## 2021-01-12 DIAGNOSIS — L89610 Pressure ulcer of right heel, unstageable: Secondary | ICD-10-CM | POA: Diagnosis not present

## 2021-01-12 DIAGNOSIS — R4182 Altered mental status, unspecified: Secondary | ICD-10-CM | POA: Diagnosis not present

## 2021-01-12 DIAGNOSIS — R0689 Other abnormalities of breathing: Secondary | ICD-10-CM | POA: Diagnosis not present

## 2021-01-12 DIAGNOSIS — Z87891 Personal history of nicotine dependence: Secondary | ICD-10-CM | POA: Insufficient documentation

## 2021-01-12 DIAGNOSIS — I4891 Unspecified atrial fibrillation: Secondary | ICD-10-CM | POA: Diagnosis not present

## 2021-01-12 DIAGNOSIS — Z7901 Long term (current) use of anticoagulants: Secondary | ICD-10-CM | POA: Insufficient documentation

## 2021-01-12 DIAGNOSIS — I700 Atherosclerosis of aorta: Secondary | ICD-10-CM | POA: Diagnosis not present

## 2021-01-12 DIAGNOSIS — N390 Urinary tract infection, site not specified: Secondary | ICD-10-CM | POA: Insufficient documentation

## 2021-01-12 DIAGNOSIS — F419 Anxiety disorder, unspecified: Secondary | ICD-10-CM | POA: Diagnosis not present

## 2021-01-12 DIAGNOSIS — E119 Type 2 diabetes mellitus without complications: Secondary | ICD-10-CM | POA: Diagnosis not present

## 2021-01-12 DIAGNOSIS — R410 Disorientation, unspecified: Secondary | ICD-10-CM | POA: Insufficient documentation

## 2021-01-12 DIAGNOSIS — F02818 Dementia in other diseases classified elsewhere, unspecified severity, with other behavioral disturbance: Secondary | ICD-10-CM | POA: Diagnosis not present

## 2021-01-12 DIAGNOSIS — R000 Tachycardia, unspecified: Secondary | ICD-10-CM | POA: Diagnosis not present

## 2021-01-12 DIAGNOSIS — R0902 Hypoxemia: Secondary | ICD-10-CM | POA: Diagnosis not present

## 2021-01-12 DIAGNOSIS — E860 Dehydration: Secondary | ICD-10-CM | POA: Insufficient documentation

## 2021-01-12 DIAGNOSIS — L89619 Pressure ulcer of right heel, unspecified stage: Secondary | ICD-10-CM

## 2021-01-12 LAB — COMPREHENSIVE METABOLIC PANEL
ALT: 18 U/L (ref 0–44)
AST: 22 U/L (ref 15–41)
Albumin: 3.6 g/dL (ref 3.5–5.0)
Alkaline Phosphatase: 139 U/L — ABNORMAL HIGH (ref 38–126)
Anion gap: 9 (ref 5–15)
BUN: 57 mg/dL — ABNORMAL HIGH (ref 8–23)
CO2: 19 mmol/L — ABNORMAL LOW (ref 22–32)
Calcium: 9.4 mg/dL (ref 8.9–10.3)
Chloride: 114 mmol/L — ABNORMAL HIGH (ref 98–111)
Creatinine, Ser: 1.34 mg/dL — ABNORMAL HIGH (ref 0.44–1.00)
GFR, Estimated: 37 mL/min — ABNORMAL LOW (ref >60)
Glucose, Bld: 125 mg/dL — ABNORMAL HIGH (ref 70–99)
Potassium: 3.9 mmol/L (ref 3.5–5.1)
Sodium: 142 mmol/L (ref 135–145)
Total Bilirubin: 1.1 mg/dL (ref 0.3–1.2)
Total Protein: 6.3 g/dL — ABNORMAL LOW (ref 6.5–8.1)

## 2021-01-12 LAB — LIPASE, BLOOD: Lipase: 21 U/L (ref 11–51)

## 2021-01-12 LAB — CBC WITH DIFFERENTIAL/PLATELET
Abs Immature Granulocytes: 0.04 10*3/uL (ref 0.00–0.07)
Basophils Absolute: 0 10*3/uL (ref 0.0–0.1)
Basophils Relative: 0 %
Eosinophils Absolute: 0 10*3/uL (ref 0.0–0.5)
Eosinophils Relative: 0 %
HCT: 36.9 % (ref 36.0–46.0)
Hemoglobin: 11.7 g/dL — ABNORMAL LOW (ref 12.0–15.0)
Immature Granulocytes: 0 %
Lymphocytes Relative: 15 %
Lymphs Abs: 1.6 10*3/uL (ref 0.7–4.0)
MCH: 29.8 pg (ref 26.0–34.0)
MCHC: 31.7 g/dL (ref 30.0–36.0)
MCV: 93.9 fL (ref 80.0–100.0)
Monocytes Absolute: 1.2 10*3/uL — ABNORMAL HIGH (ref 0.1–1.0)
Monocytes Relative: 11 %
Neutro Abs: 7.6 10*3/uL (ref 1.7–7.7)
Neutrophils Relative %: 74 %
Platelets: 211 10*3/uL (ref 150–400)
RBC: 3.93 MIL/uL (ref 3.87–5.11)
RDW: 13.2 % (ref 11.5–15.5)
WBC: 10.4 10*3/uL (ref 4.0–10.5)
nRBC: 0 % (ref 0.0–0.2)

## 2021-01-12 LAB — TROPONIN I (HIGH SENSITIVITY): Troponin I (High Sensitivity): 16 ng/L (ref <18)

## 2021-01-12 LAB — RESP PANEL BY RT-PCR (FLU A&B, COVID) ARPGX2
Influenza A by PCR: NEGATIVE
Influenza B by PCR: NEGATIVE
SARS Coronavirus 2 by RT PCR: NEGATIVE

## 2021-01-12 LAB — LACTIC ACID, PLASMA: Lactic Acid, Venous: 1.5 mmol/L (ref 0.5–1.9)

## 2021-01-12 MED ORDER — LORAZEPAM 2 MG/ML IJ SOLN
1.0000 mg | Freq: Once | INTRAMUSCULAR | Status: AC
  Administered 2021-01-12: 1 mg via INTRAVENOUS
  Filled 2021-01-12: qty 1

## 2021-01-12 MED ORDER — ACETAMINOPHEN 325 MG PO TABS
650.0000 mg | ORAL_TABLET | Freq: Once | ORAL | Status: AC
  Administered 2021-01-12: 650 mg via ORAL
  Filled 2021-01-12: qty 2

## 2021-01-12 MED ORDER — SODIUM CHLORIDE 0.9 % IV BOLUS
1000.0000 mL | Freq: Once | INTRAVENOUS | Status: AC
  Administered 2021-01-12: 1000 mL via INTRAVENOUS

## 2021-01-12 NOTE — Patient Outreach (Signed)
[place] Coordinator follow up. Kristina Wilkinson resides in [facility] per [facility]. Member screened for potential [facility] Care Management needs.   Telephone call made to Kristina Wilkinson [phone] to discuss [facility] follow up. However, it was a home phone number. Attempted to reach Kristina Wilkinson via mobile phone [phone] which is a non-working number.  Voicemail message left for [facility] SW [place] to request return call regarding transition plans/date.   Will continue to follow.   [name], MSN, RN,BSN [place] Acute Care Coordinator [phone] [facility]) [phone]  (Toll free office)

## 2021-01-12 NOTE — ED Triage Notes (Signed)
SNF staff stating that pt with increased agitation from baseline and has been combative. EMS denies combative behavior during transport. Also staff has report urine to be "dark and foul smelling"

## 2021-01-12 NOTE — Discharge Instructions (Signed)
You were seen in the emergency department for evaluation of combativeness and possible urinary tract infection.  You had a CAT scan of your head and chest x-ray along with blood work.  Your numbers look like you have some dehydration and you received some IV fluids.  There was no combativeness while you were here.

## 2021-01-12 NOTE — ED Provider Notes (Signed)
[facility] DEPT Provider Note   CSN: [id number] Arrival date & time: 01/12/21  2015     History Chief Complaint  Patient presents with   Altered Mental Status    Kristina Wilkinson is a 87 y.o. female.  She is brought in by ambulance from her facility at [facility] for evaluation of increased agitation and concerns for possible UTI.  Reportedly having dark-colored urine.  Patient herself is unable to give much history.  She will follow some simple commands and can answer questions.  She is intermittently yelling out, but when I asked her why she is yelling she said she does not know.  She denies any pain.  The history is provided by the patient and the EMS personnel.  Altered Mental Status Presenting symptoms: combativeness   Severity:  Unable to specify Most recent episode:  Today Episode history:  Unable to specify Timing:  Intermittent Progression:  Unchanged Chronicity:  Recurrent Context: nursing home resident   Associated symptoms: no abdominal pain and no difficulty breathing       Past Medical History:  Diagnosis Date   Arthritis    CNS aneurysm 1983   s/p clipping   Diabetes mellitus type II    diet controlled    DVT (deep venous thrombosis) (HCC) 10/15   Elevated glucose    Hypertension    Insomnia    Knee pain    Left Dr. Meyer Cory   LBP (low back pain)    PN (peripheral neuropathy)    unknown etiol    Patient Active Problem List   Diagnosis Date Noted   New onset a-fib ([place]) 09/25/2020   Restless leg syndrome 03/14/2019   Sepsis due to gram-negative UTI ([place]) 03/06/2019   Sepsis due to urinary tract infection ([place]) 03/05/2019   Falls 03/05/2019   Open toe wound 12/01/2018   Traumatic rhabdomyolysis ([place]) 11/30/2018   Cellulitis 11/30/2018   Transaminitis 11/30/2018   Abnormal urinalysis 11/30/2018   Leukocytosis 11/30/2018   Hypokalemia 11/30/2018   Scalp laceration    Skull fracture ([place]) 07/13/2016   Chronic  left shoulder pain 04/03/2016   Diabetes mellitus without complication ([place]) 69/62/9528   Hearing loss 06/21/2015   Advance care planning 12/17/2014   Palpitations 07/06/2014   Pain in joint, ankle and foot 04/20/2014   DVT, lower extremity ([place]) 11/19/2013   Edema 10/20/2013   Headache(784.0) 10/20/2013   Left leg swelling 10/20/2013   Aneurysm of basilar artery (HCC) 10/20/2013   Bradycardia 10/20/2013   Vitamin B12 deficiency 07/16/2013   Paresthesia of both feet 05/17/2013   TMJ arthritis 09/11/2012   Ear pain 09/11/2012   History of transfusion of packed red blood cells 09/11/2012   Spinal stenosis 04/13/2011   Well adult exam 01/23/2011   Cholecystitis with cholelithiasis, s/p lap chole 11/10/2010 12/01/2010   Shoulder pain, right 07/25/2010   TOBACCO USE, QUIT 03/09/2009   KNEE PAIN 05/18/2008   PN (peripheral neuropathy) 08/19/2007   LEG PAIN 08/19/2007   Vitamin D deficiency 02/17/2007   INSOMNIA, PERSISTENT 02/17/2007   HTN (hypertension) 02/17/2007   GERD 02/17/2007   Osteoarthritis 02/17/2007   LOW BACK PAIN 02/17/2007   Hyperglycemia 01/28/2007   UNSPECIFIED OSTEOPOROSIS 01/28/2007   PRECIPITOUS DROP IN HEMATOCRIT 01/28/2007    Past Surgical History:  Procedure Laterality Date   CEREBRAL ANEURYSM REPAIR     initial repair in the 1980s, with second surgery in the [place]  KNEE ARTHROPLASTY  04/2009   Left   TOTAL KNEE ARTHROPLASTY     TUBAL LIGATION       OB History   No obstetric history on file.     Family History  Problem Relation Age of Onset   Arthritis Mother    Stroke Mother    Stroke Father    Arthritis-Osteo Son     Social History   Tobacco Use   Smoking status: Former   Smokeless tobacco: Never   Tobacco comments:    quit in 1983  Vaping Use   Vaping Use: Never used  Substance Use Topics   Alcohol use: No    Alcohol/week: 0.0 standard drinks   Drug use: No    Home  Medications Prior to Admission medications   Medication Sig Start Date End Date Taking? Authorizing Provider  acetaminophen (TYLENOL) 325 MG tablet Take 2 tablets (650 mg total) by mouth every 6 (six) hours as needed for mild pain (or Fever >/= 101). 10/10/20   Sheikh, Omair Latif, DO  albuterol (PROVENTIL) (2.5 MG/3ML) 0.083% nebulizer solution Take 3 mLs (2.5 mg total) by nebulization every 4 (four) hours as needed for wheezing or shortness of breath. 10/10/20   Raiford Noble Latif, DO  amLODipine (NORVASC) 10 MG tablet Take 1 tablet (10 mg total) by mouth daily. 10/10/20   Raiford Noble Latif, DO  apixaban (ELIQUIS) 5 MG TABS tablet Take 1 tablet (5 mg total) by mouth 2 (two) times daily. 10/10/20   Raiford Noble Latif, DO  bisacodyl (DULCOLAX) 10 MG suppository Place 1 suppository (10 mg total) rectally daily as needed for moderate constipation. 10/10/20   Raiford Noble Latif, DO  lisinopril (ZESTRIL) 40 MG tablet Take 1 tablet (40 mg total) by mouth daily. 10/10/20   Raiford Noble Latif, DO  metoprolol succinate (TOPROL-XL) 25 MG 24 hr tablet Take 0.5 tablets (12.5 mg total) by mouth daily. 10/10/20   Sheikh, Georgina Quint Latif, DO  polyethylene glycol (MIRALAX / GLYCOLAX) 17 g packet Take 17 g by mouth 2 (two) times daily. 10/10/20   Raiford Noble Latif, DO  polyvinyl alcohol (LIQUIFILM TEARS) 1.4 % ophthalmic solution Place 1 drop into both eyes as needed for dry eyes. 10/10/20   Sheikh, Omair Latif, DO  senna-docusate (SENOKOT-S) 8.6-50 MG tablet Take 1 tablet by mouth 2 (two) times daily. 10/10/20   Raiford Noble Latif, DO    Allergies    Gabapentin, Other, Penicillins, Pregabalin, and Vit d-vit e-safflower oil  Review of Systems   Review of Systems  Unable to perform ROS: Mental status change  Gastrointestinal:  Negative for abdominal pain.   Physical Exam Updated Vital Signs Pulse (!) 126   Temp 98.5 F (36.9 C) (Oral)   Resp (!) 22   Ht 5\' 6"  (1.676 m)   SpO2 96%   BMI 26.63 kg/m    Physical Exam Vitals and nursing note reviewed.  Constitutional:      General: She is not in acute distress.    Appearance: Normal appearance. She is well-developed.  HENT:     Head: Normocephalic and atraumatic.  Eyes:     Conjunctiva/sclera: Conjunctivae normal.  Cardiovascular:     Rate and Rhythm: Tachycardia present. Rhythm irregular.     Heart sounds: No murmur heard. Pulmonary:     Effort: Pulmonary effort is normal. No respiratory distress.     Breath sounds: Normal breath sounds.  Abdominal:     Palpations: Abdomen is soft.     Tenderness: There  is no abdominal tenderness. There is no guarding or rebound.  Musculoskeletal:        General: No swelling.     Cervical back: Neck supple.  Skin:    General: Skin is warm and dry.     Capillary Refill: Capillary refill takes less than 2 seconds.     Comments: She has a mild pressure injury to her right heel.  Neurological:     General: No focal deficit present.     Mental Status: She is alert. She is disoriented.     Comments: She is limited in complying with neurologic exam but is moving all extremities nonfocal he.  There is no obvious facial droop or slurred speech.  Intermittently yelling out "OOH"  Psychiatric:        Mood and Affect: Mood normal.    ED Results / Procedures / Treatments   Labs (all labs ordered are listed, but only abnormal results are displayed) Labs Reviewed  COMPREHENSIVE METABOLIC PANEL - Abnormal; Notable for the following components:      Result Value   Chloride 114 (*)    CO2 19 (*)    Glucose, Bld 125 (*)    BUN 57 (*)    Creatinine, Ser 1.34 (*)    Total Protein 6.3 (*)    Alkaline Phosphatase 139 (*)    GFR, Estimated 37 (*)    All other components within normal limits  CBC WITH DIFFERENTIAL/PLATELET - Abnormal; Notable for the following components:   Hemoglobin 11.7 (*)    Monocytes Absolute 1.2 (*)    All other components within normal limits  URINALYSIS, ROUTINE W REFLEX  MICROSCOPIC - Abnormal; Notable for the following components:   APPearance HAZY (*)    Ketones, ur 5 (*)    Leukocytes,Ua LARGE (*)    Bacteria, UA RARE (*)    All other components within normal limits  RESP PANEL BY RT-PCR (FLU A&B, COVID) ARPGX2  URINE CULTURE  LIPASE, BLOOD  LACTIC ACID, PLASMA  TROPONIN I (HIGH SENSITIVITY)    EKG EKG Interpretation  Date/Time:  Thursday January 12 2021 21:15:44 EST Ventricular Rate:  114 PR Interval:    QRS Duration: 80 QT Interval:  365 QTC Calculation: 503 R Axis:   30 Text Interpretation: Atrial fibrillation Probable LVH with secondary repol abnrm Prolonged QT interval ST depressions more pronounced than prior 12/29/2022 Confirmed by Aletta Edouard [phone]) on 01/12/2021 9:19:38 PM  Radiology CT Head Wo Contrast  Result Date: 01/12/2021 CLINICAL DATA:  Altered mental status. EXAM: CT HEAD WITHOUT CONTRAST TECHNIQUE: Contiguous axial images were obtained from the base of the skull through the vertex without intravenous contrast. COMPARISON:  Head CT 12/11/2020. FINDINGS: Brain: There is mild-to-moderate cerebral atrophy and atrophic ventriculomegaly and moderately advanced small vessel disease in the cerebral white matter, chronic bilateral cerebellar infarctions on the left-greater-than-right and old aneurysm clips and surgical clips at the foramina magnum, No new asymmetry is seen concerning for an acute infarct, hemorrhage or mass and there is no midline shift. There is a dense calcification again noted in the left cerebellomedullary cistern which could be a calcified vertebral artery aneurysm or perhaps an old calcified extra-axial bleed, but which is unchanged. Vascular: The carotid siphons are heavily calcified. No hyperdense central vasculature is seen. Skull: No acute fracture or bone lesion is evident. Old inferior occipital craniotomy is again noted. Sinuses/Orbits: There is chronic partial opacification of the right sphenoid sinus with  hyperostosis. Other visualized sinuses and mastoid air cells  are clear. Other: None. IMPRESSION: 1. No acute intracranial CT findings or interval changes. 2. Prior inferior occipital craniotomy and aneurysm repair in the posterior fossa. 3. Old bilateral cerebellar infarctions, more so on the left. Electronically Signed   By: Telford Nab M.D.   On: 01/12/2021 21:24   DG Chest Port 1 View  Result Date: 01/12/2021 CLINICAL DATA:  Increased agitation. EXAM: PORTABLE CHEST 1 VIEW COMPARISON:  December 11, 2020 FINDINGS: Mild, stable chronic appearing increased lung markings are seen with stable linear scarring and/or atelectasis noted along the lateral aspect of the mid left lung. There is no evidence of acute infiltrate, pleural effusion or pneumothorax. The heart size and mediastinal contours are within normal limits. Marked severity calcification of the aortic arch is noted. The visualized skeletal structures are unremarkable. IMPRESSION: Chronic appearing increased lung markings with stable mid left lung linear scarring and/or atelectasis. Electronically Signed   By: Virgina Norfolk M.D.   On: 01/12/2021 21:22    Procedures Procedures   Medications Ordered in ED Medications  sodium chloride 0.9 % bolus 1,000 mL (0 mLs Intravenous Stopped 01/12/21 2300)  acetaminophen (TYLENOL) tablet 650 mg (650 mg Oral Given 01/12/21 2140)  LORazepam (ATIVAN) injection 1 mg (1 mg Intravenous Given 01/12/21 2226)  cefTRIAXone (ROCEPHIN) 1 g in sodium chloride 0.9 % 100 mL IVPB (0 g Intravenous Stopped 01/13/21 0227)    ED Course  I have reviewed the triage vital signs and the nursing notes.  Pertinent labs & imaging results that were available during my care of the patient were reviewed by me and considered in my medical decision making (see chart for details).  Clinical Course as of 01/13/21 1040  Thu Jan 12, 2021  2119 Chest x-ray with possible right lower lobe infiltrate.  Awaiting radiology reading. [MB]   2138 Radiology read chest x-ray is chronic scarring.  Head CT with no acute findings. [MB]  2151 Labs showing low bicarb and elevated creatinine reflecting some dehydration.  IV fluids ordered. [MB]  2223 Heart rate between 85 and 105 when not restless. [MB]    Clinical Course User Index [MB] Hayden Rasmussen, MD   MDM Rules/Calculators/A&P                          TARONDA COMACHO was evaluated in Emergency Department on 01/12/2021 for the symptoms described in the history of present illness. She was evaluated in the context of the global COVID-19 pandemic, which necessitated consideration that the patient might be at risk for infection with the SARS-CoV-2 virus that causes COVID-19. Institutional protocols and algorithms that pertain to the evaluation of patients at risk for COVID-19 are in a state of rapid change based on information released by regulatory bodies including the CDC and federal and state organizations. These policies and algorithms were followed during the patient's care in the ED.  This patient complains of possible combativeness, dark urine concerning for UTI; this involves an extensive number of treatment Options and is a complaint that carries with it a high risk of complications and Morbidity. The differential includes stroke, bleed, infection including COVID, flu, pneumonia, UTI, dehydration  I ordered, reviewed and interpreted labs, which included CBC with normal white count, hemoglobin stable from priors, chemistries with low bicarb elevated creatinine and BUN consistent with some dehydration, lactate not elevated, COVID and flu negative, urinalysis pending at time of signout I ordered medication IV fluids, IV Ativan I ordered imaging studies which  included chest x-ray and head CT and I independently    visualized and interpreted imaging which showed no acute findings Additional history obtained from EMS Previous records obtained and reviewed in epic, patient recently  placed [facility] after determined she did not have capacity to make her medical decisions   After the interventions stated above, I reevaluated the patient and found patient to be resting comfortably.  Her A. fib was initially tachycardic but she has been around 100 since then.  No distress.  Her care is signed out to oncoming provider Dr. Florina Ou to follow-up on results of urinalysis.  Add antibiotics if indicated.  Likely can return back to her facility. CHA2DS2/VAS Stroke Risk Points  Current as of about an hour ago     7 >= 2 Points: High Risk  1 - 1.99 Points: Medium Risk  0 Points: Low Risk    No Change      Details    This score determines the patient's risk of having a stroke if the  patient has atrial fibrillation.       Points Metrics  0 Has Congestive Heart Failure:  No    Current as of about an hour ago  0 Has Vascular Disease:  No    Current as of about an hour ago  1 Has Hypertension:  Yes    Current as of about an hour ago  2 Age:  55    Current as of about an hour ago  1 Has Diabetes:  Yes    Current as of about an hour ago  2 Had Stroke:  No  Had TIA:  No  Had Thromboembolism:  Yes     Current as of about an hour ago  1 Female:  Yes    Current as of about an hour ago            Final Clinical Impression(s) / ED Diagnoses Final diagnoses:  Dehydration  AKI (acute kidney injury) ([place])  Confusion  Pressure injury of skin of right heel, unspecified injury stage  Lower urinary tract infectious disease    Rx / DC Orders ED Discharge Orders          Ordered    cephALEXin (KEFLEX) 500 MG capsule  2 times daily        01/13/21 0024             Hayden Rasmussen, MD 01/13/21 1043

## 2021-01-13 DIAGNOSIS — F02818 Dementia in other diseases classified elsewhere, unspecified severity, with other behavioral disturbance: Secondary | ICD-10-CM | POA: Diagnosis not present

## 2021-01-13 DIAGNOSIS — R4182 Altered mental status, unspecified: Secondary | ICD-10-CM | POA: Diagnosis not present

## 2021-01-13 DIAGNOSIS — I10 Essential (primary) hypertension: Secondary | ICD-10-CM | POA: Diagnosis not present

## 2021-01-13 DIAGNOSIS — R410 Disorientation, unspecified: Secondary | ICD-10-CM | POA: Diagnosis not present

## 2021-01-13 DIAGNOSIS — Z7401 Bed confinement status: Secondary | ICD-10-CM | POA: Diagnosis not present

## 2021-01-13 DIAGNOSIS — I4891 Unspecified atrial fibrillation: Secondary | ICD-10-CM | POA: Diagnosis not present

## 2021-01-13 DIAGNOSIS — I959 Hypotension, unspecified: Secondary | ICD-10-CM | POA: Diagnosis not present

## 2021-01-13 DIAGNOSIS — N390 Urinary tract infection, site not specified: Secondary | ICD-10-CM | POA: Diagnosis not present

## 2021-01-13 LAB — URINALYSIS, ROUTINE W REFLEX MICROSCOPIC
Bilirubin Urine: NEGATIVE
Glucose, UA: NEGATIVE mg/dL
Hgb urine dipstick: NEGATIVE
Ketones, ur: 5 mg/dL — AB
Nitrite: NEGATIVE
Protein, ur: NEGATIVE mg/dL
Specific Gravity, Urine: 1.017 (ref 1.005–1.030)
pH: 5 (ref 5.0–8.0)

## 2021-01-13 LAB — URINE CULTURE

## 2021-01-13 MED ORDER — SODIUM CHLORIDE 0.9 % IV SOLN
1.0000 g | Freq: Once | INTRAVENOUS | Status: AC
  Administered 2021-01-13: 1 g via INTRAVENOUS
  Filled 2021-01-13: qty 10

## 2021-01-13 MED ORDER — CEPHALEXIN 500 MG PO CAPS: 500.0000 mg | ORAL_CAPSULE | Freq: Two times a day (BID) | ORAL | 0 refills | Status: DC

## 2021-01-13 NOTE — ED Provider Notes (Signed)
Nursing notes and vitals signs, including pulse oximetry, reviewed.  Summary of this visit's results, reviewed by myself:  EKG:  EKG Interpretation  Date/Time:  Thursday January 12 2021 21:15:44 EST Ventricular Rate:  114 PR Interval:    QRS Duration: 80 QT Interval:  365 QTC Calculation: 503 R Axis:   30 Text Interpretation: Atrial fibrillation Probable LVH with secondary repol abnrm Prolonged QT interval ST depressions more pronounced than prior 12/20/2022 Confirmed by Aletta Edouard [phone]) on 01/12/2021 9:19:38 PM        Labs:  Results for orders placed or performed during the hospital encounter of 01/12/21 (from the past 24 hour(s))  Comprehensive metabolic panel     Status: Abnormal   Collection Time: 01/12/21  8:55 PM  Result Value Ref Range   Sodium 142 135 - 145 mmol/L   Potassium 3.9 3.5 - 5.1 mmol/L   Chloride 114 (H) 98 - 111 mmol/L   CO2 19 (L) 22 - 32 mmol/L   Glucose, Bld 125 (H) 70 - 99 mg/dL   BUN 57 (H) 8 - 23 mg/dL   Creatinine, Ser 1.34 (H) 0.44 - 1.00 mg/dL   Calcium 9.4 8.9 - 10.3 mg/dL   Total Protein 6.3 (L) 6.5 - 8.1 g/dL   Albumin 3.6 3.5 - 5.0 g/dL   AST 22 15 - 41 U/L   ALT 18 0 - 44 U/L   Alkaline Phosphatase 139 (H) 38 - 126 U/L   Total Bilirubin 1.1 0.3 - 1.2 mg/dL   GFR, Estimated 37 (L) >60 mL/min   Anion gap 9 5 - 15  Lipase, blood     Status: None   Collection Time: 01/12/21  8:55 PM  Result Value Ref Range   Lipase 21 11 - 51 U/L  CBC with Differential     Status: Abnormal   Collection Time: 01/12/21  8:55 PM  Result Value Ref Range   WBC 10.4 4.0 - 10.5 K/uL   RBC 3.93 3.87 - 5.11 MIL/uL   Hemoglobin 11.7 (L) 12.0 - 15.0 g/dL   HCT 36.9 36.0 - 46.0 %   MCV 93.9 80.0 - 100.0 fL   MCH 29.8 26.0 - 34.0 pg   MCHC 31.7 30.0 - 36.0 g/dL   RDW 13.2 11.5 - 15.5 %   Platelets 211 150 - 400 K/uL   nRBC 0.0 0.0 - 0.2 %   Neutrophils Relative % 74 %   Neutro Abs 7.6 1.7 - 7.7 K/uL   Lymphocytes Relative 15 %   Lymphs Abs 1.6 0.7 - 4.0  K/uL   Monocytes Relative 11 %   Monocytes Absolute 1.2 (H) 0.1 - 1.0 K/uL   Eosinophils Relative 0 %   Eosinophils Absolute 0.0 0.0 - 0.5 K/uL   Basophils Relative 0 %   Basophils Absolute 0.0 0.0 - 0.1 K/uL   Immature Granulocytes 0 %   Abs Immature Granulocytes 0.04 0.00 - 0.07 K/uL  Troponin I (High Sensitivity)     Status: None   Collection Time: 01/12/21  8:55 PM  Result Value Ref Range   Troponin I (High Sensitivity) 16 <18 ng/L  Resp Panel by RT-PCR (Flu A&B, Covid) Nasopharyngeal Swab     Status: None   Collection Time: 01/12/21  8:55 PM   Specimen: Nasopharyngeal Swab; Nasopharyngeal(NP) swabs in vial transport medium  Result Value Ref Range   SARS Coronavirus 2 by RT PCR NEGATIVE NEGATIVE   Influenza A by PCR NEGATIVE NEGATIVE   Influenza B by PCR NEGATIVE  NEGATIVE  Lactic acid, plasma     Status: None   Collection Time: 01/12/21  8:55 PM  Result Value Ref Range   Lactic Acid, Venous 1.5 0.5 - 1.9 mmol/L    Imaging Studies: CT Head Wo Contrast  Result Date: 01/12/2021 CLINICAL DATA:  Altered mental status. EXAM: CT HEAD WITHOUT CONTRAST TECHNIQUE: Contiguous axial images were obtained from the base of the skull through the vertex without intravenous contrast. COMPARISON:  Head CT 12/11/2020. FINDINGS: Brain: There is mild-to-moderate cerebral atrophy and atrophic ventriculomegaly and moderately advanced small vessel disease in the cerebral white matter, chronic bilateral cerebellar infarctions on the left-greater-than-right and old aneurysm clips and surgical clips at the foramina magnum, No new asymmetry is seen concerning for an acute infarct, hemorrhage or mass and there is no midline shift. There is a dense calcification again noted in the left cerebellomedullary cistern which could be a calcified vertebral artery aneurysm or perhaps an old calcified extra-axial bleed, but which is unchanged. Vascular: The carotid siphons are heavily calcified. No hyperdense central  vasculature is seen. Skull: No acute fracture or bone lesion is evident. Old inferior occipital craniotomy is again noted. Sinuses/Orbits: There is chronic partial opacification of the right sphenoid sinus with hyperostosis. Other visualized sinuses and mastoid air cells are clear. Other: None. IMPRESSION: 1. No acute intracranial CT findings or interval changes. 2. Prior inferior occipital craniotomy and aneurysm repair in the posterior fossa. 3. Old bilateral cerebellar infarctions, more so on the left. Electronically Signed   By: Telford Nab M.D.   On: 01/12/2021 21:24   DG Chest Port 1 View  Result Date: 01/12/2021 CLINICAL DATA:  Increased agitation. EXAM: PORTABLE CHEST 1 VIEW COMPARISON:  December 11, 2020 FINDINGS: Mild, stable chronic appearing increased lung markings are seen with stable linear scarring and/or atelectasis noted along the lateral aspect of the mid left lung. There is no evidence of acute infiltrate, pleural effusion or pneumothorax. The heart size and mediastinal contours are within normal limits. Marked severity calcification of the aortic arch is noted. The visualized skeletal structures are unremarkable. IMPRESSION: Chronic appearing increased lung markings with stable mid left lung linear scarring and/or atelectasis. Electronically Signed   By: Virgina Norfolk M.D.   On: 01/12/2021 21:22    Urinalysis consistent with early urinary tract infection.  Will give Rocephin in ED and discharge back to her living facility on Keflex.  Urine culture pending.    Torrey Ballinas, Jenny Reichmann, MD 01/13/21 [phone]

## 2021-01-16 DIAGNOSIS — F419 Anxiety disorder, unspecified: Secondary | ICD-10-CM | POA: Diagnosis not present

## 2021-01-16 DIAGNOSIS — I10 Essential (primary) hypertension: Secondary | ICD-10-CM | POA: Diagnosis not present

## 2021-01-16 DIAGNOSIS — I4891 Unspecified atrial fibrillation: Secondary | ICD-10-CM | POA: Diagnosis not present

## 2021-01-16 DIAGNOSIS — E119 Type 2 diabetes mellitus without complications: Secondary | ICD-10-CM | POA: Diagnosis not present

## 2021-01-16 DIAGNOSIS — N390 Urinary tract infection, site not specified: Secondary | ICD-10-CM | POA: Diagnosis not present

## 2021-01-16 DIAGNOSIS — F02818 Dementia in other diseases classified elsewhere, unspecified severity, with other behavioral disturbance: Secondary | ICD-10-CM | POA: Diagnosis not present

## 2021-01-17 ENCOUNTER — Other Ambulatory Visit: Payer: Self-pay | Admitting: *Deleted

## 2021-01-17 DIAGNOSIS — F4320 Adjustment disorder, unspecified: Secondary | ICD-10-CM | POA: Diagnosis not present

## 2021-01-17 DIAGNOSIS — G3184 Mild cognitive impairment, so stated: Secondary | ICD-10-CM | POA: Diagnosis not present

## 2021-01-17 DIAGNOSIS — F5102 Adjustment insomnia: Secondary | ICD-10-CM | POA: Diagnosis not present

## 2021-01-17 NOTE — Patient Outreach (Addendum)
[place] Coordinator follow up. Kristina Wilkinson resides in [facility]. Member screened for potential [facility] Care Management needs.   Spoke with Kristina Wilkinson SNF SW indicating Kristina Wilkinson remains at [facility]. States Kristina Wilkinson's son was found deceased today in his trailer in [place]. Kristina Wilkinson states Kristina Wilkinson continues want to return home. Transition plan is still uncertain. Kristina Wilkinson states member's cousin Kristina Wilkinson did not show up for the care plan meeting today at the facility. Writer inquired whether palliative consult could be considered.   Writer will continue to follow while member resides in SNF.    Kristina Rolling, MSN, RN,BSN [place] Acute Care Coordinator [phone] [facility]) [phone]  (Toll free office)

## 2021-01-19 DIAGNOSIS — N390 Urinary tract infection, site not specified: Secondary | ICD-10-CM | POA: Diagnosis not present

## 2021-01-19 DIAGNOSIS — F419 Anxiety disorder, unspecified: Secondary | ICD-10-CM | POA: Diagnosis not present

## 2021-01-19 DIAGNOSIS — I4891 Unspecified atrial fibrillation: Secondary | ICD-10-CM | POA: Diagnosis not present

## 2021-01-19 DIAGNOSIS — F02818 Dementia in other diseases classified elsewhere, unspecified severity, with other behavioral disturbance: Secondary | ICD-10-CM | POA: Diagnosis not present

## 2021-01-19 DIAGNOSIS — I10 Essential (primary) hypertension: Secondary | ICD-10-CM | POA: Diagnosis not present

## 2021-01-20 DIAGNOSIS — E46 Unspecified protein-calorie malnutrition: Secondary | ICD-10-CM | POA: Diagnosis not present

## 2021-01-20 DIAGNOSIS — R269 Unspecified abnormalities of gait and mobility: Secondary | ICD-10-CM | POA: Diagnosis not present

## 2021-01-20 DIAGNOSIS — S0291XA Unspecified fracture of skull, initial encounter for closed fracture: Secondary | ICD-10-CM | POA: Diagnosis not present

## 2021-01-20 DIAGNOSIS — I480 Paroxysmal atrial fibrillation: Secondary | ICD-10-CM | POA: Diagnosis not present

## 2021-01-31 DIAGNOSIS — F02818 Dementia in other diseases classified elsewhere, unspecified severity, with other behavioral disturbance: Secondary | ICD-10-CM | POA: Diagnosis not present

## 2021-01-31 DIAGNOSIS — F5102 Adjustment insomnia: Secondary | ICD-10-CM | POA: Diagnosis not present

## 2021-01-31 DIAGNOSIS — I4891 Unspecified atrial fibrillation: Secondary | ICD-10-CM | POA: Diagnosis not present

## 2021-01-31 DIAGNOSIS — K2100 Gastro-esophageal reflux disease with esophagitis, without bleeding: Secondary | ICD-10-CM | POA: Diagnosis not present

## 2021-01-31 DIAGNOSIS — E119 Type 2 diabetes mellitus without complications: Secondary | ICD-10-CM | POA: Diagnosis not present

## 2021-01-31 DIAGNOSIS — G3184 Mild cognitive impairment, so stated: Secondary | ICD-10-CM | POA: Diagnosis not present

## 2021-01-31 DIAGNOSIS — F419 Anxiety disorder, unspecified: Secondary | ICD-10-CM | POA: Diagnosis not present

## 2021-01-31 DIAGNOSIS — M1990 Unspecified osteoarthritis, unspecified site: Secondary | ICD-10-CM | POA: Diagnosis not present

## 2021-01-31 DIAGNOSIS — F4320 Adjustment disorder, unspecified: Secondary | ICD-10-CM | POA: Diagnosis not present

## 2021-02-15 ENCOUNTER — Other Ambulatory Visit: Payer: Self-pay | Admitting: *Deleted

## 2021-02-15 NOTE — Patient Outreach (Signed)
[place] Coordinator follow up. Mrs. Fairfax resides in [facility]. Screened for [facility] Management services.  Spoke with Virgina Evener SNF SW who indicates Mrs. Waitman has transitioned to long term care at [facility].   Writer will sign off. No identifiable [facility] Care Management needs at this time.    [name], MSN, RN,BSN [place] Acute Care Coordinator [phone] [facility]) [phone]  (Toll free office)

## 2021-02-28 ENCOUNTER — Emergency Department (HOSPITAL_COMMUNITY): Payer: Medicare Other

## 2021-02-28 ENCOUNTER — Encounter (HOSPITAL_COMMUNITY): Payer: Self-pay

## 2021-02-28 ENCOUNTER — Other Ambulatory Visit: Payer: Self-pay

## 2021-02-28 ENCOUNTER — Inpatient Hospital Stay (HOSPITAL_COMMUNITY)
Admission: EM | Admit: 2021-02-28 | Discharge: 2021-03-02 | DRG: 872 | Disposition: A | Discharge status: Skilled Nursing Facility | Payer: Medicare Other | Source: Skilled Nursing Facility | Attending: Internal Medicine | Admitting: Internal Medicine

## 2021-02-28 DIAGNOSIS — E1169 Type 2 diabetes mellitus with other specified complication: Secondary | ICD-10-CM

## 2021-02-28 DIAGNOSIS — Z87891 Personal history of nicotine dependence: Secondary | ICD-10-CM

## 2021-02-28 DIAGNOSIS — Z86718 Personal history of other venous thrombosis and embolism: Secondary | ICD-10-CM | POA: Diagnosis not present

## 2021-02-28 DIAGNOSIS — E1142 Type 2 diabetes mellitus with diabetic polyneuropathy: Secondary | ICD-10-CM | POA: Diagnosis present

## 2021-02-28 DIAGNOSIS — Z20822 Contact with and (suspected) exposure to covid-19: Secondary | ICD-10-CM | POA: Diagnosis present

## 2021-02-28 DIAGNOSIS — D649 Anemia, unspecified: Secondary | ICD-10-CM | POA: Diagnosis present

## 2021-02-28 DIAGNOSIS — Z7901 Long term (current) use of anticoagulants: Secondary | ICD-10-CM

## 2021-02-28 DIAGNOSIS — D6489 Other specified anemias: Secondary | ICD-10-CM | POA: Diagnosis present

## 2021-02-28 DIAGNOSIS — I10 Essential (primary) hypertension: Secondary | ICD-10-CM | POA: Diagnosis present

## 2021-02-28 DIAGNOSIS — Z823 Family history of stroke: Secondary | ICD-10-CM

## 2021-02-28 DIAGNOSIS — N390 Urinary tract infection, site not specified: Secondary | ICD-10-CM | POA: Diagnosis present

## 2021-02-28 DIAGNOSIS — E119 Type 2 diabetes mellitus without complications: Secondary | ICD-10-CM

## 2021-02-28 DIAGNOSIS — E872 Acidosis, unspecified: Secondary | ICD-10-CM | POA: Diagnosis present

## 2021-02-28 DIAGNOSIS — F03918 Unspecified dementia, unspecified severity, with other behavioral disturbance: Secondary | ICD-10-CM | POA: Diagnosis present

## 2021-02-28 DIAGNOSIS — Z79899 Other long term (current) drug therapy: Secondary | ICD-10-CM | POA: Diagnosis not present

## 2021-02-28 DIAGNOSIS — K219 Gastro-esophageal reflux disease without esophagitis: Secondary | ICD-10-CM | POA: Diagnosis present

## 2021-02-28 DIAGNOSIS — I482 Chronic atrial fibrillation, unspecified: Secondary | ICD-10-CM | POA: Diagnosis present

## 2021-02-28 DIAGNOSIS — A419 Sepsis, unspecified organism: Secondary | ICD-10-CM | POA: Diagnosis present

## 2021-02-28 DIAGNOSIS — F0390 Unspecified dementia without behavioral disturbance: Secondary | ICD-10-CM | POA: Diagnosis present

## 2021-02-28 DIAGNOSIS — L8990 Pressure ulcer of unspecified site, unspecified stage: Secondary | ICD-10-CM | POA: Insufficient documentation

## 2021-02-28 DIAGNOSIS — I82409 Acute embolism and thrombosis of unspecified deep veins of unspecified lower extremity: Secondary | ICD-10-CM | POA: Diagnosis present

## 2021-02-28 LAB — PROTIME-INR
INR: 1.2 (ref 0.8–1.2)
Prothrombin Time: 14.8 seconds (ref 11.4–15.2)

## 2021-02-28 LAB — CBC WITH DIFFERENTIAL/PLATELET
Abs Immature Granulocytes: 0.04 10*3/uL (ref 0.00–0.07)
Basophils Absolute: 0.1 10*3/uL (ref 0.0–0.1)
Basophils Relative: 1 %
Eosinophils Absolute: 0 10*3/uL (ref 0.0–0.5)
Eosinophils Relative: 0 %
HCT: 35.9 % — ABNORMAL LOW (ref 36.0–46.0)
Hemoglobin: 11.4 g/dL — ABNORMAL LOW (ref 12.0–15.0)
Immature Granulocytes: 0 %
Lymphocytes Relative: 19 %
Lymphs Abs: 1.7 10*3/uL (ref 0.7–4.0)
MCH: 28.9 pg (ref 26.0–34.0)
MCHC: 31.8 g/dL (ref 30.0–36.0)
MCV: 90.9 fL (ref 80.0–100.0)
Monocytes Absolute: 1.1 10*3/uL — ABNORMAL HIGH (ref 0.1–1.0)
Monocytes Relative: 11 %
Neutro Abs: 6.4 10*3/uL (ref 1.7–7.7)
Neutrophils Relative %: 69 %
Platelets: 233 10*3/uL (ref 150–400)
RBC: 3.95 MIL/uL (ref 3.87–5.11)
RDW: 13.9 % (ref 11.5–15.5)
WBC: 9.3 10*3/uL (ref 4.0–10.5)
nRBC: 0 % (ref 0.0–0.2)

## 2021-02-28 LAB — LACTIC ACID, PLASMA
Lactic Acid, Venous: 2.4 mmol/L (ref 0.5–1.9)
Lactic Acid, Venous: 1.7 mmol/L (ref 0.5–1.9)

## 2021-02-28 LAB — URINALYSIS, ROUTINE W REFLEX MICROSCOPIC
Bilirubin Urine: NEGATIVE
Glucose, UA: NEGATIVE mg/dL
Hgb urine dipstick: NEGATIVE
Ketones, ur: NEGATIVE mg/dL
Nitrite: NEGATIVE
Protein, ur: NEGATIVE mg/dL
Specific Gravity, Urine: 1.019 (ref 1.005–1.030)
pH: 5 (ref 5.0–8.0)

## 2021-02-28 LAB — RESP PANEL BY RT-PCR (FLU A&B, COVID) ARPGX2
Influenza A by PCR: NEGATIVE
Influenza B by PCR: NEGATIVE
SARS Coronavirus 2 by RT PCR: NEGATIVE

## 2021-02-28 LAB — COMPREHENSIVE METABOLIC PANEL
ALT: 18 U/L (ref 0–44)
AST: 19 U/L (ref 15–41)
Albumin: 3.6 g/dL (ref 3.5–5.0)
Alkaline Phosphatase: 97 U/L (ref 38–126)
Anion gap: 8 (ref 5–15)
BUN: 30 mg/dL — ABNORMAL HIGH (ref 8–23)
CO2: 24 mmol/L (ref 22–32)
Calcium: 9.8 mg/dL (ref 8.9–10.3)
Chloride: 112 mmol/L — ABNORMAL HIGH (ref 98–111)
Creatinine, Ser: 1 mg/dL (ref 0.44–1.00)
GFR, Estimated: 53 mL/min — ABNORMAL LOW (ref >60)
Glucose, Bld: 143 mg/dL — ABNORMAL HIGH (ref 70–99)
Potassium: 4 mmol/L (ref 3.5–5.1)
Sodium: 144 mmol/L (ref 135–145)
Total Bilirubin: 0.8 mg/dL (ref 0.3–1.2)
Total Protein: 6.3 g/dL — ABNORMAL LOW (ref 6.5–8.1)

## 2021-02-28 LAB — GLUCOSE, CAPILLARY: Glucose-Capillary: 109 mg/dL — ABNORMAL HIGH (ref 70–99)

## 2021-02-28 LAB — APTT: aPTT: 27 seconds (ref 24–36)

## 2021-02-28 LAB — BRAIN NATRIURETIC PEPTIDE: B Natriuretic Peptide: 216.5 pg/mL — ABNORMAL HIGH (ref 0.0–100.0)

## 2021-02-28 MED ORDER — SODIUM CHLORIDE 0.9 % IV SOLN
1.0000 g | INTRAVENOUS | Status: DC
  Administered 2021-02-28 – 2021-03-01 (×2): 1 g via INTRAVENOUS
  Filled 2021-02-28 (×2): qty 10

## 2021-02-28 MED ORDER — ENOXAPARIN SODIUM 40 MG/0.4ML IJ SOSY
40.0000 mg | PREFILLED_SYRINGE | INTRAMUSCULAR | Status: DC
  Administered 2021-02-28: 40 mg via SUBCUTANEOUS
  Filled 2021-02-28: qty 0.4

## 2021-02-28 MED ORDER — LACTATED RINGERS IV SOLN: INTRAVENOUS | Status: AC

## 2021-02-28 MED ORDER — BISACODYL 10 MG RE SUPP: 10.0000 mg | Freq: Every day | RECTAL | Status: DC | PRN

## 2021-02-28 MED ORDER — SENNA 8.6 MG PO TABS
1.0000 | ORAL_TABLET | Freq: Two times a day (BID) | ORAL | Status: DC
  Administered 2021-03-01 – 2021-03-02 (×2): 8.6 mg via ORAL
  Filled 2021-02-28 (×4): qty 1

## 2021-02-28 MED ORDER — AMLODIPINE BESYLATE 10 MG PO TABS
10.0000 mg | ORAL_TABLET | Freq: Every day | ORAL | Status: DC
  Administered 2021-03-02: 10 mg via ORAL
  Filled 2021-02-28 (×2): qty 1

## 2021-02-28 MED ORDER — ACETAMINOPHEN 650 MG RE SUPP: 650.0000 mg | Freq: Four times a day (QID) | RECTAL | Status: DC | PRN

## 2021-02-28 MED ORDER — ONDANSETRON HCL 4 MG PO TABS: 4.0000 mg | ORAL_TABLET | Freq: Four times a day (QID) | ORAL | Status: DC | PRN

## 2021-02-28 MED ORDER — ACETAMINOPHEN 325 MG PO TABS: 650.0000 mg | ORAL_TABLET | Freq: Four times a day (QID) | ORAL | Status: DC | PRN

## 2021-02-28 MED ORDER — PANTOPRAZOLE SODIUM 40 MG PO TBEC
40.0000 mg | DELAYED_RELEASE_TABLET | Freq: Every day | ORAL | Status: DC
  Administered 2021-03-02: 40 mg via ORAL
  Filled 2021-02-28 (×2): qty 1

## 2021-02-28 MED ORDER — ONDANSETRON HCL 4 MG/2ML IJ SOLN: 4.0000 mg | Freq: Four times a day (QID) | INTRAMUSCULAR | Status: DC | PRN

## 2021-02-28 MED ORDER — BUSPIRONE HCL 5 MG PO TABS
5.0000 mg | ORAL_TABLET | Freq: Two times a day (BID) | ORAL | Status: DC
Start: 2021-02-28 — End: 2021-03-02
  Administered 2021-02-28 – 2021-03-02 (×3): 5 mg via ORAL
  Filled 2021-02-28 (×4): qty 1

## 2021-02-28 MED ORDER — METRONIDAZOLE 500 MG/100ML IV SOLN
500.0000 mg | Freq: Once | INTRAVENOUS | Status: AC
  Administered 2021-02-28: 500 mg via INTRAVENOUS
  Filled 2021-02-28: qty 100

## 2021-02-28 MED ORDER — LISINOPRIL 10 MG PO TABS: 40.0000 mg | ORAL_TABLET | Freq: Every day | ORAL | Status: DC

## 2021-02-28 MED ORDER — METOPROLOL TARTRATE 5 MG/5ML IV SOLN
5.0000 mg | Freq: Once | INTRAVENOUS | Status: AC
  Administered 2021-02-28: 5 mg via INTRAVENOUS
  Filled 2021-02-28: qty 5

## 2021-02-28 MED ORDER — ALBUTEROL SULFATE (2.5 MG/3ML) 0.083% IN NEBU: 2.5000 mg | INHALATION_SOLUTION | RESPIRATORY_TRACT | Status: DC | PRN

## 2021-02-28 MED ORDER — APIXABAN 5 MG PO TABS
5.0000 mg | ORAL_TABLET | Freq: Two times a day (BID) | ORAL | Status: DC
  Administered 2021-03-01 – 2021-03-02 (×2): 5 mg via ORAL
  Filled 2021-02-28 (×4): qty 1

## 2021-02-28 MED ORDER — LISINOPRIL 20 MG PO TABS
40.0000 mg | ORAL_TABLET | Freq: Every day | ORAL | Status: DC
  Administered 2021-03-02: 40 mg via ORAL
  Filled 2021-02-28 (×2): qty 2

## 2021-02-28 MED ORDER — LORAZEPAM POWD: 1.0000 mg | Status: DC

## 2021-02-28 MED ORDER — LACTATED RINGERS IV BOLUS
1000.0000 mL | Freq: Once | INTRAVENOUS | Status: AC
  Administered 2021-02-28: 1000 mL via INTRAVENOUS

## 2021-02-28 MED ORDER — POLYVINYL ALCOHOL 1.4 % OP SOLN
1.0000 [drp] | Freq: Three times a day (TID) | OPHTHALMIC | Status: DC | PRN
  Filled 2021-02-28: qty 15

## 2021-02-28 MED ORDER — HALOPERIDOL LACTATE 5 MG/ML IJ SOLN
2.0000 mg | Freq: Four times a day (QID) | INTRAMUSCULAR | Status: AC | PRN
  Administered 2021-02-28 (×2): 2 mg via INTRAVENOUS
  Filled 2021-02-28 (×2): qty 1

## 2021-02-28 MED ORDER — AMLODIPINE BESYLATE 5 MG PO TABS: 10.0000 mg | ORAL_TABLET | Freq: Every day | ORAL | Status: DC

## 2021-02-28 MED ORDER — METOPROLOL SUCCINATE ER 25 MG PO TB24: 12.5000 mg | ORAL_TABLET | Freq: Every day | ORAL | Status: DC

## 2021-02-28 MED ORDER — METOPROLOL SUCCINATE ER 25 MG PO TB24
12.5000 mg | ORAL_TABLET | Freq: Every day | ORAL | Status: DC
  Filled 2021-02-28 (×2): qty 1

## 2021-02-28 MED ORDER — POLYETHYLENE GLYCOL 3350 17 G PO PACK
17.0000 g | PACK | Freq: Two times a day (BID) | ORAL | Status: DC
  Administered 2021-03-02: 17 g via ORAL
  Filled 2021-02-28 (×3): qty 1

## 2021-02-28 MED ORDER — LACTATED RINGERS IV BOLUS (SEPSIS)
1000.0000 mL | Freq: Once | INTRAVENOUS | Status: AC
  Administered 2021-02-28: 1000 mL via INTRAVENOUS

## 2021-02-28 MED ORDER — RISPERIDONE 0.25 MG PO TABS
0.5000 mg | ORAL_TABLET | Freq: Two times a day (BID) | ORAL | Status: DC | PRN
  Administered 2021-02-28: 0.5 mg via ORAL
  Filled 2021-02-28: qty 1

## 2021-02-28 MED ORDER — SODIUM CHLORIDE 0.9 % IV SOLN
2.0000 g | Freq: Once | INTRAVENOUS | Status: AC
  Administered 2021-02-28: 2 g via INTRAVENOUS
  Filled 2021-02-28: qty 2

## 2021-02-28 MED ORDER — VANCOMYCIN HCL IN DEXTROSE 1-5 GM/200ML-% IV SOLN
1000.0000 mg | Freq: Once | INTRAVENOUS | Status: AC
  Administered 2021-02-28: 1000 mg via INTRAVENOUS
  Filled 2021-02-28: qty 200

## 2021-02-28 NOTE — Progress Notes (Signed)
A consult was received from an ED physician for Vancomycin and Cefepime per pharmacy dosing.  The patient's profile has been reviewed for ht/wt/allergies/indication/available labs.    A one time order has been placed for Vancomycin 1gm IV and Cefepime 2gm IV.    Further antibiotics/pharmacy consults should be ordered by admitting physician if indicated.                       Thank you, Everette Rank, PharmD 02/28/2021  6:47 AM

## 2021-02-28 NOTE — H&P (Signed)
History and Physical    Kristina Wilkinson OFB:[id number] DOB: 07-13-1927 DOA: 02/28/2021  PCP: Pcp, No   Patient coming from: SNF.  I have personally briefly reviewed patient's old medical records in [place]  Chief Complaint: AMS and fever  HPI: Kristina Wilkinson is a 88 y.o. female with medical history significant of osteoarthritis, CNS aneurysm with clipping history, type II DM, history of DVT, chronic atrial fibrillation on apixaban, hypertension, insomnia, history of lower back pain, GERD, diabetic peripheral neuropathy, history of dementia who is brought to the emergency department from her facility via EMS due to AMS and fever.  She is unable to provide further history.  ED Course: Initial vital signs were temperature Initial vital signs were temperature 99.2 F, pulse 92, respiration 18, BP 175/111 mmHg and O2 sat 96% on room air.  She was treated with 2000 mL of LR bolus, vancomycin, metronidazole and cefepime.  Lab work: Her urinalysis was hazy with moderate leukocyte esterase, 6-10 WBC and many bacteria presence of old injuries.  CBC is her white count 9.3, hemoglobin 11.4 g/dL platelets 233.  CMP showed a chloride of 112 mmol/L, all other electrolytes were normal.  PT, INR and PTT were normal.  Lactic acid was 2.4 and then 1.7 mmol/L.  Glucose 143, BUN 30 and creatinine 1.0 mg/dL.  LFTs were unremarkable cephalototal protein of 6.3 g/dL.  Imaging: Chest radiograph showed linear opacity of the left mid lung likely due to atelectasis.  No definite left lower lobe opacity.  Review of Systems: As per HPI otherwise all other systems reviewed and are negative.  Past Medical History:  Diagnosis Date   Arthritis    CNS aneurysm 1983   s/p clipping   Diabetes mellitus type II    diet controlled    DVT (deep venous thrombosis) (HCC) 10/15   Elevated glucose    Hypertension    Insomnia    Knee pain    Left Dr. Meyer Cory   LBP (low back pain)    PN (peripheral neuropathy)     unknown etiol    Past Surgical History:  Procedure Laterality Date   CEREBRAL ANEURYSM REPAIR     initial repair in the 1980s, with second surgery in the [place]  04/2009   Left   TOTAL KNEE ARTHROPLASTY     TUBAL LIGATION      Social History  reports that she has quit smoking. She has never used smokeless tobacco. She reports that she does not drink alcohol and does not use drugs.  Allergies  Allergen Reactions   Gabapentin Itching and Other (See Comments)    Made the legs "go numb" and "allergic," per [facility]   Other Other (See Comments)    "MD mixed a blood pressure medication with the Clinoril I was taking and I couldn't walk" "made me achy and sick"   Penicillins Other (See Comments) and Itching    Has patient had a PCN reaction causing immediate rash, facial/tongue/throat swelling, SOB or lightheadedness with hypotension: Unk- exact reaction not recalled, but is allergic Has patient had a PCN reaction causing severe rash involving mucus membranes or skin necrosis: Unk Has patient had a PCN reaction that required hospitalization: Unk Has patient had a PCN reaction occurring within the last 10 years: Yes If all of the above answers are "NO", then may proceed with Cephalosporin use.  Itching and rash   Pregabalin  Other (See Comments)    "Made me pass out" and "made the limbs go numb"- "allergic," per [facility]   Vit D-Vit E-Safflower Oil Other (See Comments)    "Made me achy and sick"    Family History  Problem Relation Age of Onset   Arthritis Mother    Stroke Mother    Stroke Father    Arthritis-Osteo Son    Prior to Admission medications   Medication Sig Start Date End Date Taking? Authorizing Provider  acetaminophen (TYLENOL) 325 MG tablet Take 2 tablets (650 mg total) by mouth every 6 (six) hours as needed for mild pain (or Fever >/= 101). 10/10/20  Yes Sheikh, Omair Latif, DO  albuterol (PROVENTIL) (2.5 MG/3ML)  0.083% nebulizer solution Take 3 mLs (2.5 mg total) by nebulization every 4 (four) hours as needed for wheezing or shortness of breath. 10/10/20  Yes Sheikh, Omair Latif, DO  amLODipine (NORVASC) 10 MG tablet Take 1 tablet (10 mg total) by mouth daily. 10/10/20  Yes Sheikh, Omair Latif, DO  apixaban (ELIQUIS) 5 MG TABS tablet Take 1 tablet (5 mg total) by mouth 2 (two) times daily. 10/10/20  Yes Sheikh, Omair Latif, DO  bisacodyl (DULCOLAX) 10 MG suppository Place 1 suppository (10 mg total) rectally daily as needed for moderate constipation. 10/10/20  Yes Sheikh, Omair Latif, DO  busPIRone (BUSPAR) 5 MG tablet Take 5 mg by mouth in the morning and at bedtime.   Yes [provider]  lisinopril (ZESTRIL) 40 MG tablet Take 1 tablet (40 mg total) by mouth daily. 10/10/20  Yes Sheikh, Omair Latif, DO  Lorazepam POWD Apply 1 mg topically See admin instructions. Apply 1 mg of gel (after reconstituted) onto the skin three times a day as needed for anxiety or agitation   Yes [provider]  metoprolol succinate (TOPROL-XL) 25 MG 24 hr tablet Take 0.5 tablets (12.5 mg total) by mouth daily. 10/10/20  Yes Sheikh, Omair Latif, DO  polyethylene glycol (MIRALAX / GLYCOLAX) 17 g packet Take 17 g by mouth 2 (two) times daily. 10/10/20  Yes Sheikh, Omair Latif, DO  polyvinyl alcohol (LIQUIFILM TEARS) 1.4 % ophthalmic solution Place 1 drop into both eyes as needed for dry eyes. Patient taking differently: Place 1 drop into both eyes every 8 (eight) hours as needed for dry eyes. 10/10/20  Yes Sheikh, Omair Latif, DO  potassium chloride SA (KLOR-CON M) 20 MEQ tablet Take 40 mEq by mouth daily. 02/20/21  Yes [provider]  risperiDONE (RISPERDAL) 0.5 MG tablet Take 0.5 mg by mouth 2 (two) times daily as needed (hallucination). 01/23/21  Yes [provider]  senna (SENOKOT) 8.6 MG TABS tablet Take 1 tablet by mouth in the morning and at bedtime.   Yes [provider]  ZOFRAN 4 MG  tablet Take 4 mg by mouth every 6 (six) hours as needed for nausea.   Yes [provider]  cephALEXin (KEFLEX) 500 MG capsule Take 1 capsule (500 mg total) by mouth 2 (two) times daily. Patient not taking: Reported on 02/28/2021 01/13/21   Shanon Rosser, MD    Physical Exam: Vitals:   02/28/21 1030 02/28/21 1100 02/28/21 1130 02/28/21 1200  BP: (!) 165/66 (!) 141/68 (!) 151/55 (!) 153/77  Pulse: (!) 107 (!) 104 95 96  Resp: (!) 27 15 20 17   Temp:      TempSrc:      SpO2: 94% 94% 94% 96%    Constitutional: Elderly, restless, unable to answer questions. Eyes: PERRL, lids  and conjunctivae normal ENMT: Mucous membranes are dry.  Posterior pharynx clear of any exudate or lesions. Neck: normal, supple, no masses, no thyromegaly Respiratory: clear to auscultation bilaterally, no wheezing, no crackles. Normal respiratory effort. No accessory muscle use.  Cardiovascular: Tachycardic in the low 100s, no murmurs / rubs / gallops. No extremity edema. 2+ pedal pulses. No carotid bruits.  Abdomen: Obese, no distention.  Soft, no tenderness, no masses palpated. No hepatosplenomegaly. Bowel sounds positive.  Musculoskeletal: no clubbing / cyanosis.  Moderate generalized weakness.  Good ROM, no contractures. Normal muscle tone.  Skin: no rashes, lesions, ulcers. No induration Neurologic: Grossly nonfocal. Psychiatric: Restless.  Disoriented.  Unable to participate in exam.  Labs on Admission: I have personally reviewed following labs and imaging studies  CBC: Recent Labs  Lab 02/28/21 0600  WBC 9.3  NEUTROABS 6.4  HGB 11.4*  HCT 35.9*  MCV 90.9  PLT 553    Basic Metabolic Panel: Recent Labs  Lab 02/28/21 0600  NA 144  K 4.0  CL 112*  CO2 24  GLUCOSE 143*  BUN 30*  CREATININE 1.00  CALCIUM 9.8    GFR: CrCl cannot be calculated (Unknown ideal weight.).  Liver Function Tests: Recent Labs  Lab 02/28/21 0600  AST 19  ALT 18  ALKPHOS 97  BILITOT 0.8  PROT 6.3*   ALBUMIN 3.6    Urine analysis:    Component Value Date/Time   COLORURINE YELLOW 02/28/2021 0558   APPEARANCEUR HAZY (A) 02/28/2021 0558   LABSPEC 1.019 02/28/2021 0558   PHURINE 5.0 02/28/2021 0558   GLUCOSEU NEGATIVE 02/28/2021 0558        HGBUR NEGATIVE 02/28/2021 0558   BILIRUBINUR NEGATIVE 02/28/2021 0558        KETONESUR NEGATIVE 02/28/2021 0558   PROTEINUR NEGATIVE 02/28/2021 0558             NITRITE NEGATIVE 02/28/2021 0558   LEUKOCYTESUR MODERATE (A) 02/28/2021 0558    Radiological Exams on Admission: DG Chest 2 View  Result Date: 02/28/2021 CLINICAL DATA:  Concern for pneumonia EXAM: CHEST - 2 VIEW COMPARISON:  Chest x-ray dated February 28, 2021 FINDINGS: Normal heart size. Tortuosity of the thoracic aorta, similar to prior exams. Linear opacity of the left mid lung, favored to be due to atelectasis. No definite left lower lung opacity. Lateral view is limited. No evidence of pleural effusion or pneumothorax. IMPRESSION: 1. Linear opacity of the left mid lung, likely due to atelectasis. 2. No definite left lower lung opacity, however lateral view is limited. Electronically Signed   By: Yetta Glassman M.D.   On: 02/28/2021 08:29   DG Chest Port 1 View  Result Date: 02/28/2021 CLINICAL DATA:  88 year old female with possible sepsis. Altered mental status, fever, shortness of breath. EXAM: PORTABLE CHEST 1 VIEW COMPARISON:  Portable chest 01/12/2021 and earlier. FINDINGS: Portable AP semi upright view at 0627 hours. Mildly lower lung volumes. Mediastinal contours remain within normal limits. Calcified aortic atherosclerosis. Visualized tracheal air column is within normal limits. Difficult to exclude acute peribronchial opacity at the left lung base. But elsewhere Allowing for portable technique the lungs are clear. Cholecystectomy clips. Negative visible bowel gas. No acute osseous abnormality identified. IMPRESSION: Lower lung volumes since December with possible acute  opacity at the left lung base. Consider atelectasis, pneumonia, or aspiration in this setting. PA and lateral views would be helpful when feasible. Electronically Signed   By: Genevie Ann M.D.   On: 02/28/2021 06:48    EKG: Independently  reviewed.  Vent. rate 86 BPM PR interval * ms QRS duration 76 ms QT/QTcB 387/466 ms P-R-T axes * 16 56 Atrial flutter  Assessment/Plan Principal Problem:   Sepsis secondary to UTI ([place]) Foley catheter draining hazy urine. Admit to telemetry/inpatient. Continue IV fluids. Ceftriaxone 1 g IVPB daily. Follow-up blood culture and sensitivity. Follow-up urine culture and sensitivity.  Active Problems:   HTN (hypertension) Continue metoprolol 12.5 mg p.o. twice daily Continue lisinopril 40 mg p.o. daily. Continue amlodipine 10 mg p.o. daily. Monitor BP, HR, renal function and electrolytes.    GERD Begin Protonix 40 mg p.o. daily.    DVT, lower extremity (HCC) On apixaban.    Type 2 diabetes mellitus (HCC)  Carbohydrate modified diet. CBG monitoring before meals and bedtime.    Chronic atrial fibrillation (HCC) CHA?DS?-VASc Score of at least 7 Continue apixaban 5 mg p.o. twice daily. Continue metoprolol for rate control.    Dementia ([place]) Supportive care. Continue risperidone. Low-dose Haldol as needed. Consider palliative care consult.    Normocytic anemia Monitor H&H    DVT prophylaxis: On apixaban. Code Status:   Full code. Family Communication:   Disposition Plan:   Patient is from:  SNF.  Anticipated DC to:  Nursing home facility.  Anticipated DC date:  03/03/2021.  Anticipated DC barriers: Clinical condition.  Consults called  Admission status:  Inpatient/telemetry.  Severity of Illness: High severity in the setting of sepsis secondary to UTI.  Reubin Milan MD [facility]  How to contact the [facility] Attending or Consulting provider [place] or covering provider during after hours [place], for this patient?    Check the care team in [facility] and look for a) attending/consulting [facility] provider listed and b) the [facility] team listed Log into [email or web address] and use [place]'s universal password to access. If you do not have the password, please contact the hospital operator. Locate the [facility] provider you are looking for under [facility] and page to a number that you can be directly reached. If you still have difficulty reaching the provider, please page the [facility] (Director on Call) for the Hospitalists listed on amion for assistance.  02/28/2021, 12:51 PM   This document was prepared using Dragon voice recognition software and may contain some unintended transcription errors.

## 2021-02-28 NOTE — ED Provider Notes (Signed)
Physical Exam  BP (!) 134/58    Pulse 80    Temp 99.9 F (37.7 C) (Rectal)    Resp 18    SpO2 95%   Physical Exam Constitutional:      Appearance: She is not toxic-appearing.     Comments: Patient crying out throughout exam, so she is scared.  Grabbing on any objects she can reach.  Placed in mitts at the bedside for her own safety and the safety of ED staff.   HENT:     Head: Normocephalic and atraumatic.  Eyes:     Extraocular Movements: Extraocular movements intact.     Pupils: Pupils are equal, round, and reactive to light.  Cardiovascular:     Rate and Rhythm: Normal rate and regular rhythm.  Pulmonary:     Effort: Pulmonary effort is normal. No tachypnea, bradypnea, accessory muscle usage or respiratory distress.     Breath sounds: Examination of the right-upper field reveals rales. Examination of the left-upper field reveals rales. Examination of the right-middle field reveals rales. Examination of the left-middle field reveals rales. Examination of the right-lower field reveals rales. Examination of the left-lower field reveals rales. Rales present.  Abdominal:     General: Abdomen is flat.     Palpations: Abdomen is soft.     Comments: Patient unable to participate in exam.  Unsure if there is abdominal tenderness noted.  Patient does not reflexively guard upon palpation of abdomen.  Genitourinary:    Comments: Patient crying out throughout exam, so she is scared.  Grabbing on any objects she can reach.  Placed in mitts at the bedside for her own safety and the safety of ED staff.  Musculoskeletal:        General: Normal range of motion.     Comments: Patient moves all 4 extremities spontaneously.  Skin:    General: Skin is warm and dry.     Capillary Refill: Capillary refill takes less than 2 seconds.  Neurological:     Mental Status: She is disoriented and confused.     GCS: GCS eye subscore is 4. GCS verbal subscore is 2. GCS motor subscore is 4.     Comments: Patient  unable to participate in neurological examination.  Unable to follow commands.    Procedures  Procedures  ED Course / MDM    Medical Decision Making Amount and/or Complexity of Data Reviewed Labs: ordered. Radiology: ordered. ECG/medicine tests: ordered.  Risk Prescription drug management. Decision regarding hospitalization.    Patient signed out to me from [facility].  This is a 88 year old female who presents from a nursing home.  According nursing of staff, patient was normal yesterday and in her usual state of health.  Patient is able to ambulate without assistance, alert and oriented x4 at all times.  Nursing staff reported that throughout the night the patient became increasingly altered, with an elevated temperature.  The patient was given Tylenol with good effect but the patient began crying out more so throughout the night and was not directable.  Nursing of staff called EMS and the patient was brought here.  Upon my interaction with this patient, she has not been directable or responsive.  She appears to be extremely altered at this time.  Oxygen saturation years been greater than 94% on room air, the belly is nondistended, patient has sacral decubitus ulcer stage I noted, and bruising on the vulva and the shape of the pure wick.  This would make sense seeing  as outpatient was recently diagnosed with UTI.  Patient chest x-ray shows findings concerning for pneumonia.  The patient's been started on broad-spectrum antibiotics due to recent UTI as well as newfound pneumonia.    Patient has baseline anemia on her CBC, rest of work-up is pending. Lactic acid 2.4.  I discussed the patient and her case with Dr. Olevia Bowens of the hospitalist service who agreed to admit this patient.  Patient is stable on time of admission.    Azucena Cecil, PA-C 02/28/21 1250    Lacretia Leigh, MD 03/03/21 1725

## 2021-02-28 NOTE — ED Provider Notes (Signed)
[facility] DEPT Provider Note   CSN: [id number] Arrival date & time: 02/28/21  0540     History  Chief Complaint  Patient presents with   Fever   Shortness of Breath   Altered Mental Status    Kristina Wilkinson is a 88 y.o. female who presents from [facility] facility this morning for altered mental status.  Patient recently completed a 3-day course of antibiotics for urinary tract infection on 02/23/2021.  Was her normal self yesterday on the 16th but this morning her temperature crept up to became febrile, and gradually became more altered and citing their transport to the ED at this time.  Patient is found to be yelling, not responsive to questions but awake, grabbing things and pulling at cords, and not directable.  Level 5 caveat due to patient's altered mental status.  At baseline patient is conversant and able to ambulate with assistance.  Per EMS temporal temperature was 103 F.  Patient was administered Tylenol at [facility] this morning prior to departure for the emergency department.  I have personally reviewed this patient's medical records.  She has history of diabetes type 2, hypertension, DVT anticoagulated on Eliquis.  Patient presents with copy of updated MOST form from her facility according to which patient is to be a full code.  HPI     Home Medications Prior to Admission medications   Medication Sig Start Date End Date Taking? Authorizing Provider  acetaminophen (TYLENOL) 325 MG tablet Take 2 tablets (650 mg total) by mouth every 6 (six) hours as needed for mild pain (or Fever >/= 101). 10/10/20   Sheikh, Omair Latif, DO  albuterol (PROVENTIL) (2.5 MG/3ML) 0.083% nebulizer solution Take 3 mLs (2.5 mg total) by nebulization every 4 (four) hours as needed for wheezing or shortness of breath. 10/10/20   Raiford Noble Latif, DO  amLODipine (NORVASC) 10 MG tablet Take 1 tablet (10 mg total) by mouth daily. 10/10/20   Raiford Noble  Latif, DO  apixaban (ELIQUIS) 5 MG TABS tablet Take 1 tablet (5 mg total) by mouth 2 (two) times daily. 10/10/20   Raiford Noble Latif, DO  bisacodyl (DULCOLAX) 10 MG suppository Place 1 suppository (10 mg total) rectally daily as needed for moderate constipation. 10/10/20   Sheikh, Georgina Quint Latif, DO  busPIRone (BUSPAR) 5 MG tablet Take 5 mg by mouth in the morning and at bedtime.    [provider]  cephALEXin (KEFLEX) 500 MG capsule Take 1 capsule (500 mg total) by mouth 2 (two) times daily. 01/13/21   Molpus, John, MD  lisinopril (ZESTRIL) 40 MG tablet Take 1 tablet (40 mg total) by mouth daily. 10/10/20   Raiford Noble Latif, DO  Lorazepam POWD Apply 1 mg topically See admin instructions. Apply 1 mg of gel (after reconstituted) onto the skin three times a day as needed for anxiety or agitation    [provider]  metoprolol succinate (TOPROL-XL) 25 MG 24 hr tablet Take 0.5 tablets (12.5 mg total) by mouth daily. 10/10/20   Sheikh, Georgina Quint Latif, DO  polyethylene glycol (MIRALAX / GLYCOLAX) 17 g packet Take 17 g by mouth 2 (two) times daily. 10/10/20   Raiford Noble Latif, DO  polyvinyl alcohol (LIQUIFILM TEARS) 1.4 % ophthalmic solution Place 1 drop into both eyes as needed for dry eyes. 10/10/20   Raiford Noble Latif, DO  senna (SENOKOT) 8.6 MG TABS tablet Take 1 tablet by mouth in the morning and at bedtime.    [provider]  ZOFRAN 4 MG tablet Take 4 mg by mouth every 6 (six) hours as needed for nausea.    [provider]      Allergies    Gabapentin, Other, Penicillins, Pregabalin, and Vit d-vit e-safflower oil    Review of Systems   Review of Systems  Unable to perform ROS: Mental status change   Physical Exam Updated Vital Signs BP (!) 122/110    Pulse 85    Temp 99.9 F (37.7 C) (Rectal)    Resp 18    SpO2 93%  Physical Exam Vitals and nursing note reviewed. Exam conducted with a chaperone present.  Constitutional:      Appearance: She is not  toxic-appearing.     Comments: Patient crying out throughout exam, so she is scared.  Grabbing on any objects she can reach.  Placed in mitts at the bedside for her own safety and the safety of ED staff.  HENT:     Head: Normocephalic and atraumatic.     Nose: Nose normal.     Mouth/Throat:     Mouth: Mucous membranes are dry.     Pharynx: Oropharynx is clear. Uvula midline. No oropharyngeal exudate or posterior oropharyngeal erythema.  Eyes:     General: Lids are normal.        Right eye: No discharge.        Left eye: No discharge.     Conjunctiva/sclera: Conjunctivae normal.     Pupils: Pupils are equal, round, and reactive to light.  Neck:     Trachea: Trachea and phonation normal.  Cardiovascular:     Rate and Rhythm: Normal rate and regular rhythm.     Pulses: Normal pulses.     Heart sounds: Normal heart sounds.  Pulmonary:     Effort: Pulmonary effort is normal. No tachypnea, bradypnea, accessory muscle usage, prolonged expiration or respiratory distress.     Breath sounds: No stridor or decreased air movement. Examination of the right-middle field reveals rales. Examination of the left-middle field reveals rales. Examination of the right-lower field reveals rales. Examination of the left-lower field reveals rales. Rales present. No wheezing.  Chest:     Chest wall: No mass, lacerations, deformity, swelling, tenderness, crepitus or edema. There is no dullness to percussion.  Abdominal:     General: Bowel sounds are normal. There is no distension.     Palpations: Abdomen is soft.     Tenderness: There is no abdominal tenderness. There is no guarding.     Comments: Patient unable to participate in the exam therefore unclear if there is any true tenderness, however she does not withdraw from any palpation of the abdomen  Genitourinary:    Comments: Bruising of the labia bilaterally in a linear fashion, question recent pure wick usage Musculoskeletal:        General: No  deformity.     Cervical back: Neck supple. No edema or crepitus. No pain with movement or spinous process tenderness.     Right lower leg: No edema.     Left lower leg: No edema.       Legs:     Comments: Moving all 4 extremities spontaneously and without difficulty  Lymphadenopathy:     Cervical: No cervical adenopathy.  Skin:    General: Skin is warm and dry.     Capillary Refill: Capillary refill takes less than 2 seconds.  Neurological:     General: No focal deficit present.     Mental Status: She  is disoriented and confused.     GCS: GCS eye subscore is 4. GCS verbal subscore is 2. GCS motor subscore is 4.  Psychiatric:        Mood and Affect: Mood normal.    ED Results / Procedures / Treatments   Labs (all labs ordered are listed, but only abnormal results are displayed) Labs Reviewed  CBC WITH DIFFERENTIAL/PLATELET - Abnormal; Notable for the following components:      Result Value   Hemoglobin 11.4 (*)    HCT 35.9 (*)    Monocytes Absolute 1.1 (*)    All other components within normal limits  URINE CULTURE  CULTURE, BLOOD (ROUTINE X 2)  CULTURE, BLOOD (ROUTINE X 2)  COMPREHENSIVE METABOLIC PANEL  URINALYSIS, ROUTINE W REFLEX MICROSCOPIC  LACTIC ACID, PLASMA  LACTIC ACID, PLASMA  PROTIME-INR  APTT    EKG None  Radiology No results found.  Procedures Procedures    Medications Ordered in ED Medications  ceFEPIme (MAXIPIME) 2 g in sodium chloride 0.9 % 100 mL IVPB (2 g Intravenous New Bag/Given 02/28/21 0628)  metroNIDAZOLE (FLAGYL) IVPB 500 mg (500 mg Intravenous New Bag/Given 02/28/21 2025)  vancomycin (VANCOCIN) IVPB 1000 mg/200 mL premix (has no administration in time range)    ED Course/ Medical Decision Making/ A&P                           Medical Decision Making 88 year old female presents with concern for altered mental status from [facility] today as well as fever.    The differential diagnosis for AMS is extensive and includes, but is not  limited to:  Drug overdose - opioids, alcohol, sedatives, antipsychotics, drug withdrawal, others Metabolic: hypoxia, hypoglycemia, hyperglycemia, hypercalcemia, hypernatremia, hyponatremia, uremia, hepatic encephalopathy, hypothyroidism, hyperthyroidism, vitamin B12 or thiamine deficiency, carbon monoxide poisoning, Wilson's disease, Lactic acidosis, DKA/HHOS Infectious: meningitis, encephalitis, bacteremia/sepsis, urinary tract infection, pneumonia, neurosyphilis Structural: Space-occupying lesion, (brain tumor, subdural hematoma, hydrocephalus,) Vascular: stroke, subarachnoid hemorrhage, coronary ischemia, hypertensive encephalopathy, CNS vasculitis, thrombotic thrombocytopenic purpura, disseminated intravascular coagulation, hyperviscosity Psychiatric: Schizophrenia, depression; Other: Seizure, hypothermia, heat stroke, ICU psychosis, dementia -"sundowning."  Afebrile with rectal temperature of 99 F on intake.  Hypertensive, normal oxygen saturation.  Cardiopulmonary exam complicated by patient's yelling, however was notable for rales throughout the lung fields bilaterally, unremarkable cardiac exam.  Abdomen is soft and nondistended.  Sacral decubitus ulcer and bruising of the labia bilaterally, question recent pure wick use.  Amount and/or Complexity of Data Reviewed Labs: ordered.    Details: CBC with mild anemia of 11, at patient's baseline.  Remainder of laboratory evaluation pending at time of shift change. Radiology: ordered. ECG/medicine tests: ordered and independent interpretation performed.    Details: Aflutter, no STEMI   88 year old female presents with altered mental status, undergoing work-up for questionable systemic infection.  Suspect pulmonary versus urologic etiology; history of urinary tract infection, however abnormal pulmonary exam.  Care of this patient signed out to oncoming ED provider Astrid Drafts, PA-C at time of shift change.  All pertinent HPI, physical exam,  laboratory findings were discussed with him prior to my departure.  This chart was dictated using voice recognition software, Dragon. Despite the best efforts of this provider to proofread and correct errors, errors may still occur which can change documentation meaning.  Final Clinical Impression(s) / ED Diagnoses Final diagnoses:  None    Rx / DC Orders ED Discharge Orders     None  Aura Dials 02/28/21 9914    Fatima Blank, MD 02/28/21 [phone]

## 2021-02-28 NOTE — ED Notes (Signed)
NT at bedside feeding patient

## 2021-02-28 NOTE — ED Triage Notes (Addendum)
[name] EMS [facility] home with reports of AMS, fever, and SHOB since yesterday afternoon. Pt was dx with a UTI last week. EMS temporal temp 103.0 F.   Triage temp 99.2 axillary.

## 2021-03-01 DIAGNOSIS — L8990 Pressure ulcer of unspecified site, unspecified stage: Secondary | ICD-10-CM | POA: Insufficient documentation

## 2021-03-01 DIAGNOSIS — F0390 Unspecified dementia without behavioral disturbance: Secondary | ICD-10-CM

## 2021-03-01 LAB — CBC WITH DIFFERENTIAL/PLATELET
Abs Immature Granulocytes: 0.02 10*3/uL (ref 0.00–0.07)
Basophils Absolute: 0 10*3/uL (ref 0.0–0.1)
Basophils Relative: 1 %
Eosinophils Absolute: 0.2 10*3/uL (ref 0.0–0.5)
Eosinophils Relative: 4 %
HCT: 29.2 % — ABNORMAL LOW (ref 36.0–46.0)
Hemoglobin: 9.4 g/dL — ABNORMAL LOW (ref 12.0–15.0)
Immature Granulocytes: 0 %
Lymphocytes Relative: 30 %
Lymphs Abs: 1.7 10*3/uL (ref 0.7–4.0)
MCH: 29.8 pg (ref 26.0–34.0)
MCHC: 32.2 g/dL (ref 30.0–36.0)
MCV: 92.7 fL (ref 80.0–100.0)
Monocytes Absolute: 0.6 10*3/uL (ref 0.1–1.0)
Monocytes Relative: 11 %
Neutro Abs: 3 10*3/uL (ref 1.7–7.7)
Neutrophils Relative %: 54 %
Platelets: 156 10*3/uL (ref 150–400)
RBC: 3.15 MIL/uL — ABNORMAL LOW (ref 3.87–5.11)
RDW: 13.9 % (ref 11.5–15.5)
WBC: 5.6 10*3/uL (ref 4.0–10.5)
nRBC: 0 % (ref 0.0–0.2)

## 2021-03-01 LAB — GLUCOSE, CAPILLARY
Glucose-Capillary: 77 mg/dL (ref 70–99)
Glucose-Capillary: 92 mg/dL (ref 70–99)
Glucose-Capillary: 151 mg/dL — ABNORMAL HIGH (ref 70–99)
Glucose-Capillary: 131 mg/dL — ABNORMAL HIGH (ref 70–99)

## 2021-03-01 LAB — HEMOGLOBIN A1C
Hgb A1c MFr Bld: 6.1 % — ABNORMAL HIGH (ref 4.8–5.6)
Mean Plasma Glucose: 128.37 mg/dL

## 2021-03-01 NOTE — Hospital Course (Addendum)
Kristina Wilkinson is a 88 y.o. female with PMH dementia, OA, CNS aneurysm s/p clipping, DM II, history of DVT, chronic A. fib on Eliquis, HTN, insomnia, low back pain, GERD, peripheral neuropathy who was brought to the ER due to altered mentation and fever. Due to her underlying dementia she was unable to provide any collateral information on admission. Work-up was notable for urinalysis suggestive of UTI and she was started on Rocephin. Urine culture speciated to Enterococcus faecalis and given her allergy profile she was treated with a 3 g dose of fosfomycin to satisfy course.

## 2021-03-01 NOTE — TOC Initial Note (Signed)
Transition of Care [facility]) - Initial/Assessment Note    Patient Details  Name: Kristina Wilkinson MRN: [id number] Date of Birth: [birth year]  [facility] of [facility] CM/SW Contact:    Kyiah Canepa, Marjie Skiff, RN Phone Number: 03/01/2021, 2:53 PM  Clinical Narrative:                 Pt is a long term care resident at [facility] and will return there at dc. TOC will continue to follow.  Expected Discharge Plan: Long Term Nursing Home Barriers to Discharge: Continued Medical Work up   Expected Discharge Plan and Services Expected Discharge Plan: [place]   Discharge Planning Services: CM Consult   Living arrangements for the past 2 months: [place]                       Prior Living Arrangements/Services Living arrangements for the past 2 months: [place] Lives with:: Facility Resident Patient language and need for interpreter reviewed:: Yes              Criminal Activity/Legal Involvement Pertinent to Current Situation/Hospitalization: No - Comment as needed     Admission diagnosis:  Sepsis secondary to UTI ([place]) [A41.9, N39.0] Patient Active Problem List   Diagnosis Date Noted   Pressure injury of skin 03/01/2021   Sepsis secondary to UTI ([place]) 02/28/2021   Dementia ([place]) 02/28/2021   Normocytic anemia 02/28/2021   Chronic atrial fibrillation ([place]) 09/25/2020   Restless leg syndrome 03/14/2019   Sepsis due to gram-negative UTI ([place]) 03/06/2019   Sepsis due to urinary tract infection ([place]) 03/05/2019   Falls 03/05/2019   Open toe wound 12/01/2018   Traumatic rhabdomyolysis ([place]) 11/30/2018   Cellulitis 11/30/2018   Transaminitis 11/30/2018   Abnormal urinalysis 11/30/2018   Leukocytosis 11/30/2018   Hypokalemia 11/30/2018   Scalp laceration    Skull fracture ([place]) 07/13/2016   Chronic left shoulder pain 04/03/2016   Type 2 diabetes mellitus ([place]) 06/27/2015   Hearing loss 06/21/2015   Advance care planning 12/17/2014    Palpitations 07/06/2014   Pain in joint, ankle and foot 04/20/2014   DVT, lower extremity ([place]) 11/19/2013   Edema 10/20/2013   Headache(784.0) 10/20/2013   Left leg swelling 10/20/2013   Aneurysm of basilar artery ([place]) 10/20/2013   Bradycardia 10/20/2013   Vitamin B12 deficiency 07/16/2013   Paresthesia of both feet 05/17/2013   TMJ arthritis 09/11/2012   Ear pain 09/11/2012   History of transfusion of packed red blood cells 09/11/2012   Spinal stenosis 04/13/2011   Well adult exam 01/23/2011   Cholecystitis with cholelithiasis, s/p lap chole 11/10/2010 12/01/2010   Shoulder pain, right 07/25/2010   TOBACCO USE, QUIT 03/09/2009   KNEE PAIN 05/18/2008   PN (peripheral neuropathy) 08/19/2007   LEG PAIN 08/19/2007   Vitamin D deficiency 02/17/2007   INSOMNIA, PERSISTENT 02/17/2007   HTN (hypertension) 02/17/2007   GERD 02/17/2007   Osteoarthritis 02/17/2007   LOW BACK PAIN 02/17/2007   Hyperglycemia 01/28/2007   UNSPECIFIED OSTEOPOROSIS 01/28/2007   PRECIPITOUS DROP IN HEMATOCRIT 01/28/2007   PCP:  Pcp, No Pharmacy:   [place] [place] - [address] [place] [address] Phone: [phone] Fax: [phone]     Social Determinants of Health (SDOH) Interventions    Readmission Risk Interventions Readmission Risk Prevention Plan 03/01/2021  Transportation Screening Complete  PCP or Specialist Appt within 3-5 Days Complete  HRI or Home  Care Consult Complete  Social Work Consult for [place] Planning/Counseling Complete  Palliative Care Screening Not Applicable  Medication Review (RN Care Manager) Complete  Some recent data might be hidden

## 2021-03-01 NOTE — Progress Notes (Signed)
°  Progress Note    Kristina Wilkinson   NLZ:[id number]  DOB: 1928/02/03  DOA: 02/28/2021     1 PCP: Pcp, No  Initial CC: fever, AMS  Hospital Course: Kristina Wilkinson is a 88 y.o. female with PMH dementia, OA, CNS aneurysm s/p clipping, DM II, history of DVT, chronic A. fib on Eliquis, HTN, insomnia, low back pain, GERD, peripheral neuropathy who was brought to the ER due to altered mentation and fever. Due to her underlying dementia she was unable to provide any collateral information on admission. Work-up was notable for urinalysis suggestive of UTI and she was started on Rocephin.  Interval History:  Resting in bed comfortably this morning.  Unable to answer most of my questions due to underlying dementia.  Assessment & Plan:  Sepsis secondary to UTI ([place]) -Tachycardia, lactic acidosis, suspect urinary source - UA noted with moderate LE, negative nitrite, many bacteria, budding yeast, 6-10 WBC however -Continue Rocephin - Follow-up blood and urine cultures   HTN (hypertension) Continue metoprolol 12.5 mg p.o. twice daily Continue lisinopril 40 mg p.o. daily. Continue amlodipine 10 mg p.o. daily.   GERD Begin Protonix 40 mg p.o. daily.   Hx DVT, lower extremity (HCC) On apixaban.   Type 2 diabetes mellitus (HCC) - A1c 6.1% on 03/01/21 - continue diet control    Chronic atrial fibrillation (HCC) CHA?DS?-VASc Score of at least 7 Continue apixaban 5 mg p.o. twice daily. Continue metoprolol for rate control.   Dementia ([place]) Supportive care. Continue risperidone. Low-dose Haldol as needed.   Normocytic anemia Monitor H&H    Old records reviewed in assessment of this patient  Antimicrobials: Cefepime, Flagyl, Vanc 02/28/2021 x 1 Rocephin 02/28/2021 >> current  DVT prophylaxis: Eliquis  Code Status:   Code Status: Full Code  Disposition Plan: SNF Status is: Inpatient  Objective: Blood pressure (!) 120/55, pulse 62, temperature 97.7 F (36.5 C), temperature  source Oral, resp. rate 16, height 5\' 6"  (1.676 m), weight 65.2 kg, SpO2 96 %.  Examination:  Physical Exam Constitutional:      Comments: Pleasantly demented elderly woman resting in bed in no distress  HENT:     Head: Normocephalic and atraumatic.     Mouth/Throat:     Mouth: Mucous membranes are moist.  Eyes:     Extraocular Movements: Extraocular movements intact.  Cardiovascular:     Rate and Rhythm: Normal rate and regular rhythm.  Pulmonary:     Effort: Pulmonary effort is normal.     Breath sounds: Normal breath sounds.  Abdominal:     General: Bowel sounds are normal. There is no distension.     Palpations: Abdomen is soft.     Tenderness: There is no abdominal tenderness.  Musculoskeletal:        General: Normal range of motion.     Cervical back: Normal range of motion and neck supple.  Skin:    General: Skin is warm and dry.  Neurological:     Comments: Dementia appreciated; able to follow commands     Consultants:    Procedures:    Data Reviewed: I have personally reviewed labs and imaging studies    LOS: 1 day  Time spent: Greater than 50% of the 35 minute visit was spent in counseling/coordination of care for the patient as laid out in the A&P.   Dwyane Dee, MD [facility] 03/01/2021, 4:32 PM

## 2021-03-01 NOTE — Progress Notes (Signed)
This Probation officer received report of low output from this pt and that MD was aware. [place] MD notified by this writer at 1601 that pt had low output this admission and most recent bladder scan showed 140 ml at 1300, per report. MD notified that no maintenance fluid orders at this time. No new orders.

## 2021-03-01 NOTE — Progress Notes (Addendum)
Pt triggering yellow mews with hypothermia and bradycardia. Unchanged assessment from prior. Pt drowsy but arousable. Will continue to monitor.    03/01/21 0807  Assess: MEWS Score  Temp (!) 94.3 F (34.6 C)  BP 134/76  Pulse Rate (!) 46  Level of Consciousness Alert  SpO2 100 %  O2 Device Room Air  Patient Activity (if Appropriate) In bed  Assess: MEWS Score  MEWS Temp 2  MEWS Systolic 0  MEWS Pulse 1  MEWS RR 0  MEWS LOC 0  MEWS Score 3  MEWS Score Color Yellow  Assess: if the MEWS score is Yellow or Red  Were vital signs taken at a resting state? Yes  Focused Assessment No change from prior assessment  Does the patient meet 2 or more of the SIRS criteria? No  MEWS guidelines implemented *See Row Information* No, previously yellow, continue vital signs every 4 hours  Treat  MEWS Interventions Escalated (See documentation below)  Pain Scale PAINAD  Pain Score 0  Breathing 0  Negative Vocalization 0  Facial Expression 0  Body Language 0  Consolability 0  PAINAD Score 0  Neuro symptoms relieved by Rest  Patients response to intervention Relief  Take Vital Signs  Increase Vital Sign Frequency  Yellow: Q 2hr X 2 then Q 4hr X 2, if remains yellow, continue Q 4hrs  Escalate  MEWS: Escalate Yellow: discuss with charge nurse/RN and consider discussing with provider and RRT  Notify: Charge Nurse/RN  Name of Charge Nurse/RN Notified Kim RN  Date Charge Nurse/RN Notified 03/01/21  Time Charge Nurse/RN Notified 0807  Document  Patient Outcome Stabilized after interventions  Progress note created (see row info) Yes  Assess: SIRS CRITERIA  SIRS Temperature  1  SIRS Pulse 0  SIRS Respirations  0  SIRS WBC 0  SIRS Score Sum  1   MEWS Guidelines - (patients age 15 and over)

## 2021-03-02 LAB — URINE CULTURE: Culture: >=100000 — AB

## 2021-03-02 LAB — CBC WITH DIFFERENTIAL/PLATELET
Abs Immature Granulocytes: 0.01 10*3/uL (ref 0.00–0.07)
Basophils Absolute: 0 10*3/uL (ref 0.0–0.1)
Basophils Relative: 1 %
Eosinophils Absolute: 0.3 10*3/uL (ref 0.0–0.5)
Eosinophils Relative: 4 %
HCT: 32.9 % — ABNORMAL LOW (ref 36.0–46.0)
Hemoglobin: 10.5 g/dL — ABNORMAL LOW (ref 12.0–15.0)
Immature Granulocytes: 0 %
Lymphocytes Relative: 25 %
Lymphs Abs: 1.6 10*3/uL (ref 0.7–4.0)
MCH: 30.1 pg (ref 26.0–34.0)
MCHC: 31.9 g/dL (ref 30.0–36.0)
MCV: 94.3 fL (ref 80.0–100.0)
Monocytes Absolute: 0.7 10*3/uL (ref 0.1–1.0)
Monocytes Relative: 10 %
Neutro Abs: 3.9 10*3/uL (ref 1.7–7.7)
Neutrophils Relative %: 60 %
Platelets: 174 10*3/uL (ref 150–400)
RBC: 3.49 MIL/uL — ABNORMAL LOW (ref 3.87–5.11)
RDW: 14 % (ref 11.5–15.5)
WBC: 6.5 10*3/uL (ref 4.0–10.5)
nRBC: 0 % (ref 0.0–0.2)

## 2021-03-02 LAB — BASIC METABOLIC PANEL
Anion gap: 10 (ref 5–15)
BUN: 22 mg/dL (ref 8–23)
CO2: 23 mmol/L (ref 22–32)
Calcium: 9.3 mg/dL (ref 8.9–10.3)
Chloride: 109 mmol/L (ref 98–111)
Creatinine, Ser: 0.53 mg/dL (ref 0.44–1.00)
GFR, Estimated: >60 mL/min (ref >60)
Glucose, Bld: 112 mg/dL — ABNORMAL HIGH (ref 70–99)
Potassium: 3 mmol/L — ABNORMAL LOW (ref 3.5–5.1)
Sodium: 142 mmol/L (ref 135–145)

## 2021-03-02 LAB — RESP PANEL BY RT-PCR (FLU A&B, COVID) ARPGX2
Influenza A by PCR: NEGATIVE
Influenza B by PCR: NEGATIVE
SARS Coronavirus 2 by RT PCR: NEGATIVE

## 2021-03-02 LAB — GLUCOSE, CAPILLARY
Glucose-Capillary: 87 mg/dL (ref 70–99)
Glucose-Capillary: 144 mg/dL — ABNORMAL HIGH (ref 70–99)
Glucose-Capillary: 156 mg/dL — ABNORMAL HIGH (ref 70–99)

## 2021-03-02 LAB — MAGNESIUM: Magnesium: 2 mg/dL (ref 1.7–2.4)

## 2021-03-02 MED ORDER — POTASSIUM CHLORIDE CRYS ER 20 MEQ PO TBCR
40.0000 meq | EXTENDED_RELEASE_TABLET | ORAL | Status: AC
  Administered 2021-03-02 (×2): 40 meq via ORAL
  Filled 2021-03-02 (×2): qty 2

## 2021-03-02 MED ORDER — FOSFOMYCIN TROMETHAMINE 3 G PO PACK
3.0000 g | PACK | Freq: Once | ORAL | Status: AC
  Administered 2021-03-02: 3 g via ORAL
  Filled 2021-03-02: qty 3

## 2021-03-02 NOTE — Discharge Summary (Signed)
Physician Discharge Summary   Kristina Wilkinson OZD:[id number] DOB: 1928/01/28 DOA: 02/28/2021  PCP: Merryl Hacker, No  Admit date: 02/28/2021 Discharge date:  03/02/2021  Admitted From: SNF Disposition:  SNF Discharging physician: Dwyane Dee, MD  Recommendations for Outpatient Follow-up:  Resume wound care   Discharge Condition: stable CODE STATUS: Full Diet recommendation:  Diet Orders (From admission, onward)     Start     Ordered   03/02/21 0000  Diet - low sodium heart healthy        03/02/21 1234   03/02/21 0000  Diet Carb Modified        03/02/21 1234   02/28/21 0902  Diet heart healthy/carb modified Room service appropriate? Yes; Fluid consistency: Thin  Diet effective now       Question Answer Comment  Diet-HS Snack? Nothing   Room service appropriate? Yes   Fluid consistency: Thin      02/28/21 0906            Hospital Course: Kristina Wilkinson is a 88 y.o. female with PMH dementia, OA, CNS aneurysm s/p clipping, DM II, history of DVT, chronic A. fib on Eliquis, HTN, insomnia, low back pain, GERD, peripheral neuropathy who was brought to the ER due to altered mentation and fever. Due to her underlying dementia she was unable to provide any collateral information on admission. Work-up was notable for urinalysis suggestive of UTI and she was started on Rocephin. Urine culture speciated to Enterococcus faecalis and given her allergy profile she was treated with a 3 g dose of fosfomycin to satisfy course.  No notes have been filed under this hospital service. Service: Hospitalist   Principal Diagnosis: Sepsis secondary to UTI [facility])  Discharge Diagnoses: Principal Problem:   Sepsis secondary to UTI [facility]) Active Problems:   HTN (hypertension)   GERD   DVT, lower extremity (HCC)   Type 2 diabetes mellitus ([place])   Chronic atrial fibrillation (HCC)   Dementia (HCC)   Normocytic anemia   Pressure injury of skin   Discharge Instructions     Diet - low sodium heart  healthy   Complete by: As directed    Diet Carb Modified   Complete by: As directed    Discharge wound care:   Complete by: As directed    Resume wound care      Allergies as of 03/02/2021       Reactions   Gabapentin Itching, Other (See Comments)   Made the legs "go numb" and "allergic," per [facility]   Other Other (See Comments)   "MD mixed a blood pressure medication with the Clinoril I was taking and I couldn't walk" "made me achy and sick"   Penicillins Other (See Comments), Itching   Has patient had a PCN reaction causing immediate rash, facial/tongue/throat swelling, SOB or lightheadedness with hypotension: Unk- exact reaction not recalled, but is allergic Has patient had a PCN reaction causing severe rash involving mucus membranes or skin necrosis: Unk Has patient had a PCN reaction that required hospitalization: Unk Has patient had a PCN reaction occurring within the last 10 years: Yes If all of the above answers are "NO", then may proceed with Cephalosporin use. Itching and rash   Pregabalin Other (See Comments)   "Made me pass out" and "made the limbs go numb"- "allergic," per [facility]   Vit D-vit E-safflower Oil Other (See Comments)   "Made me achy and sick"        Medication List  STOP taking these medications    cephALEXin 500 MG capsule Commonly known as: KEFLEX       TAKE these medications    acetaminophen 325 MG tablet Commonly known as: TYLENOL Take 2 tablets (650 mg total) by mouth every 6 (six) hours as needed for mild pain (or Fever >/= 101).   albuterol (2.5 MG/3ML) 0.083% nebulizer solution Commonly known as: PROVENTIL Take 3 mLs (2.5 mg total) by nebulization every 4 (four) hours as needed for wheezing or shortness of breath.   amLODipine 10 MG tablet Commonly known as: NORVASC Take 1 tablet (10 mg total) by mouth daily.   apixaban 5 MG Tabs tablet Commonly known as: ELIQUIS Take 1 tablet (5 mg total) by mouth 2 (two) times daily.    bisacodyl 10 MG suppository Commonly known as: DULCOLAX Place 1 suppository (10 mg total) rectally daily as needed for moderate constipation.   busPIRone 5 MG tablet Commonly known as: BUSPAR Take 5 mg by mouth in the morning and at bedtime.   lisinopril 40 MG tablet Commonly known as: ZESTRIL Take 1 tablet (40 mg total) by mouth daily.   Lorazepam Powd Apply 1 mg topically See admin instructions. Apply 1 mg of gel (after reconstituted) onto the skin three times a day as needed for anxiety or agitation   metoprolol succinate 25 MG 24 hr tablet Commonly known as: TOPROL-XL Take 0.5 tablets (12.5 mg total) by mouth daily.   polyethylene glycol 17 g packet Commonly known as: MIRALAX / GLYCOLAX Take 17 g by mouth 2 (two) times daily.   polyvinyl alcohol 1.4 % ophthalmic solution Commonly known as: LIQUIFILM TEARS Place 1 drop into both eyes as needed for dry eyes. What changed: when to take this   potassium chloride SA 20 MEQ tablet Commonly known as: KLOR-CON M Take 40 mEq by mouth daily.   risperiDONE 0.5 MG tablet Commonly known as: RISPERDAL Take 0.5 mg by mouth 2 (two) times daily as needed (hallucination).   senna 8.6 MG Tabs tablet Commonly known as: SENOKOT Take 1 tablet by mouth in the morning and at bedtime.   Zofran 4 MG tablet Generic drug: ondansetron Take 4 mg by mouth every 6 (six) hours as needed for nausea.               Discharge Care Instructions  (From admission, onward)           Start     Ordered   03/02/21 0000  Discharge wound care:       Comments: Resume wound care   03/02/21 1234            Allergies  Allergen Reactions   Gabapentin Itching and Other (See Comments)    Made the legs "go numb" and "allergic," per [facility]   Other Other (See Comments)    "MD mixed a blood pressure medication with the Clinoril I was taking and I couldn't walk" "made me achy and sick"   Penicillins Other (See Comments) and Itching    Has  patient had a PCN reaction causing immediate rash, facial/tongue/throat swelling, SOB or lightheadedness with hypotension: Unk- exact reaction not recalled, but is allergic Has patient had a PCN reaction causing severe rash involving mucus membranes or skin necrosis: Unk Has patient had a PCN reaction that required hospitalization: Unk Has patient had a PCN reaction occurring within the last 10 years: Yes If all of the above answers are "NO", then may proceed with Cephalosporin use.  Itching and  rash   Pregabalin Other (See Comments)    "Made me pass out" and "made the limbs go numb"- "allergic," per [facility]   Vit D-Vit E-Safflower Oil Other (See Comments)    "Made me achy and sick"    Consultations:   Discharge Exam: BP (!) 151/52 (BP Location: Right Arm)    Pulse (!) 57    Temp (!) 97.4 F (36.3 C) (Axillary)    Resp 20    Ht 5\' 6"  (1.676 m)    Wt 65.2 kg    SpO2 96%    BMI 23.20 kg/m  Physical Exam Constitutional:      Appearance: She is well-developed.     Comments: Pleasantly demented elderly woman resting in bed in no distress  HENT:     Head: Normocephalic and atraumatic.     Mouth/Throat:     Mouth: Mucous membranes are moist.  Eyes:     Extraocular Movements: Extraocular movements intact.  Cardiovascular:     Rate and Rhythm: Normal rate and regular rhythm.  Pulmonary:     Effort: Pulmonary effort is normal.     Breath sounds: Normal breath sounds.  Abdominal:     General: Bowel sounds are normal. There is no distension.     Palpations: Abdomen is soft.     Tenderness: There is no abdominal tenderness.  Musculoskeletal:        General: Normal range of motion.     Cervical back: Normal range of motion and neck supple.  Skin:    General: Skin is warm and dry.  Neurological:     Mental Status: She is alert.     Comments: Dementia appreciated; able to follow commands     The results of significant diagnostics from this hospitalization (including imaging,  microbiology, ancillary and laboratory) are listed below for reference.   Microbiology: Recent Results (from the past 240 hour(s))  Urine Culture     Status: Abnormal   Collection Time: 02/28/21  5:58 AM   Specimen: Urine, Clean Catch  Result Value Ref Range Status   Specimen Description   Final    URINE, CLEAN CATCH Performed at [facility], [place] [address] [place], [place] [address]    Special Requests   Final    NONE Performed at [facility], [place] [address], [place], [place] [address]    Culture >=100,000 COLONIES/mL ENTEROCOCCUS FAECALIS (A)  Final   Report Status 03/02/2021 FINAL  Final   Organism ID, Bacteria ENTEROCOCCUS FAECALIS (A)  Final      Susceptibility   Enterococcus faecalis - MIC*    AMPICILLIN <=2 SENSITIVE Sensitive     NITROFURANTOIN <=16 SENSITIVE Sensitive     VANCOMYCIN 1 SENSITIVE Sensitive     * >=100,000 COLONIES/mL ENTEROCOCCUS FAECALIS  Blood culture (routine x 2)     Status: None (Preliminary result)   Collection Time: 02/28/21  6:00 AM   Specimen: BLOOD  Result Value Ref Range Status   Specimen Description   Final    BLOOD BLOOD LEFT WRIST Performed at [place] [address] [place], [place] [address]    Special Requests   Final    BOTTLES DRAWN AEROBIC AND ANAEROBIC Blood Culture adequate volume Performed at [place] [address], [place], [place] [address]    Culture   Final    NO GROWTH 2 DAYS Performed at [place] [facility] [place] [address], [place], [place] [address]  Report Status PENDING  Incomplete  Blood culture (routine x 2)     Status: None (Preliminary result)   Collection Time: 02/28/21  6:00 AM   Specimen: BLOOD  Result Value Ref Range Status   Specimen Description   Final    BLOOD BLOOD RIGHT HAND Performed at [facility] [address], [place], [place] [address]    Special Requests   Final    BOTTLES DRAWN  AEROBIC AND ANAEROBIC Blood Culture adequate volume Performed at [facility] [address], [place], [place] [address]    Culture   Final    NO GROWTH 2 DAYS Performed at [facility] [address], [place], [place] [address]    Report Status PENDING  Incomplete  Resp Panel by RT-PCR (Flu A&B, Covid) Nasopharyngeal Swab     Status: None   Collection Time: 02/28/21  9:22 AM   Specimen: Nasopharyngeal Swab; Nasopharyngeal(NP) swabs in vial transport medium  Result Value Ref Range Status   SARS Coronavirus 2 by RT PCR NEGATIVE NEGATIVE Final    Comment: (NOTE) SARS-CoV-2 target nucleic acids are NOT DETECTED.  The SARS-CoV-2 RNA is generally detectable in upper respiratory specimens during the acute phase of infection. The lowest concentration of SARS-CoV-2 viral copies this assay can detect is 138 copies/mL. A negative result does not preclude SARS-Cov-2 infection and should not be used as the sole basis for treatment or other patient management decisions. A negative result may occur with  improper specimen collection/handling, submission of specimen other than nasopharyngeal swab, presence of viral mutation(s) within the areas targeted by this assay, and inadequate number of viral copies(<138 copies/mL). A negative result must be combined with clinical observations, patient history, and epidemiological information. The expected result is Negative.  Fact Sheet for Patients:  EntrepreneurPulse.com.au  Fact Sheet for Healthcare Providers:  IncredibleEmployment.be  This test is no t yet approved or cleared by the [place] FDA and  has been authorized for detection and/or diagnosis of SARS-CoV-2 by FDA under an Emergency Use Authorization (EUA). This EUA will remain  in effect (meaning this test can be used) for the duration of the COVID-19 declaration under Section 564(b)(1) of the Act, 21 U.S.C.section  360bbb-3(b)(1), unless the authorization is terminated  or revoked sooner.       Influenza A by PCR NEGATIVE NEGATIVE Final   Influenza B by PCR NEGATIVE NEGATIVE Final    Comment: (NOTE) The Xpert Xpress SARS-CoV-2/FLU/RSV plus assay is intended as an aid in the diagnosis of influenza from Nasopharyngeal swab specimens and should not be used as a sole basis for treatment. Nasal washings and aspirates are unacceptable for Xpert Xpress SARS-CoV-2/FLU/RSV testing.  Fact Sheet for Patients: EntrepreneurPulse.com.au  Fact Sheet for Healthcare Providers: IncredibleEmployment.be  This test is not yet approved or cleared by the [place] FDA and has been authorized for detection and/or diagnosis of SARS-CoV-2 by FDA under an Emergency Use Authorization (EUA). This EUA will remain in effect (meaning this test can be used) for the duration of the COVID-19 declaration under Section 564(b)(1) of the Act, 21 U.S.C. section 360bbb-3(b)(1), unless the authorization is terminated or revoked.  Performed at [facility], [place] [address] [place], [place] [address]      Labs: BNP (last 3 results) Recent Labs    02/28/21 0600  BNP 572.6*   Basic Metabolic Panel: Recent Labs  Lab 02/28/21 0600 03/02/21 0445  NA 144 142  K 4.0 3.0*  CL 112* 109  CO2 24 23  GLUCOSE 143* 112*  BUN 30* 22  CREATININE 1.00 0.53  CALCIUM 9.8 9.3  MG  --  2.0   Liver Function Tests: Recent Labs  Lab 02/28/21 0600  AST 19  ALT 18  ALKPHOS 97  BILITOT 0.8  PROT 6.3*  ALBUMIN 3.6   No results for input(s): LIPASE, AMYLASE in the last 168 hours. No results for input(s): AMMONIA in the last 168 hours. CBC: Recent Labs  Lab 02/28/21 0600 03/01/21 0502 03/02/21 0445  WBC 9.3 5.6 6.5  NEUTROABS 6.4 3.0 3.9  HGB 11.4* 9.4* 10.5*  HCT 35.9* 29.2* 32.9*  MCV 90.9 92.7 94.3  PLT 233 156 174   Cardiac Enzymes: No results for input(s):  CKTOTAL, CKMB, CKMBINDEX, TROPONINI in the last 168 hours. BNP: Invalid input(s): POCBNP CBG: Recent Labs  Lab 03/01/21 1128 03/01/21 1543 03/01/21 2030 03/02/21 0757 03/02/21 1152  GLUCAP 92 151* 131* 87 144*   D-Dimer No results for input(s): DDIMER in the last 72 hours. Hgb A1c Recent Labs    03/01/21 0502  HGBA1C 6.1*   Lipid Profile No results for input(s): CHOL, HDL, LDLCALC, TRIG, CHOLHDL, LDLDIRECT in the last 72 hours. Thyroid function studies No results for input(s): TSH, T4TOTAL, T3FREE, THYROIDAB in the last 72 hours.  Invalid input(s): FREET3 Anemia work up No results for input(s): VITAMINB12, FOLATE, FERRITIN, TIBC, IRON, RETICCTPCT in the last 72 hours. Urinalysis    Component Value Date/Time   COLORURINE YELLOW 02/28/2021 0558   APPEARANCEUR HAZY (A) 02/28/2021 0558   LABSPEC 1.019 02/28/2021 0558   PHURINE 5.0 02/28/2021 0558   GLUCOSEU NEGATIVE 02/28/2021 0558   GLUCOSEU NEGATIVE 01/16/2011 0914   HGBUR NEGATIVE 02/28/2021 0558   BILIRUBINUR NEGATIVE 02/28/2021 0558   BILIRUBINUR neg 05/01/2017 1220   KETONESUR NEGATIVE 02/28/2021 0558   PROTEINUR NEGATIVE 02/28/2021 0558   UROBILINOGEN 0.2 05/01/2017 1220   UROBILINOGEN 0.2 04/14/2012 0930   NITRITE NEGATIVE 02/28/2021 0558   LEUKOCYTESUR MODERATE (A) 02/28/2021 0558   Sepsis Labs Invalid input(s): PROCALCITONIN,  WBC,  LACTICIDVEN Microbiology Recent Results (from the past 240 hour(s))  Urine Culture     Status: Abnormal   Collection Time: 02/28/21  5:58 AM   Specimen: Urine, Clean Catch  Result Value Ref Range Status   Specimen Description   Final    URINE, CLEAN CATCH Performed at [facility], [place] [address] [place], [place] [address]    Special Requests   Final    NONE Performed at [facility], [place] [address], [place], [place] [address]    Culture >=100,000 COLONIES/mL ENTEROCOCCUS FAECALIS (A)  Final   Report Status 03/02/2021 FINAL   Final   Organism ID, Bacteria ENTEROCOCCUS FAECALIS (A)  Final      Susceptibility   Enterococcus faecalis - MIC*    AMPICILLIN <=2 SENSITIVE Sensitive     NITROFURANTOIN <=16 SENSITIVE Sensitive     VANCOMYCIN 1 SENSITIVE Sensitive     * >=100,000 COLONIES/mL ENTEROCOCCUS FAECALIS  Blood culture (routine x 2)     Status: None (Preliminary result)   Collection Time: 02/28/21  6:00 AM   Specimen: BLOOD  Result Value Ref Range Status   Specimen Description   Final    BLOOD BLOOD LEFT WRIST Performed at [facility] [address], [place], [place] [address]    Special Requests   Final    BOTTLES DRAWN AEROBIC AND ANAEROBIC Blood Culture adequate volume Performed  at [facility], [place] [address], [place], [place] [address]    Culture   Final    NO GROWTH 2 DAYS Performed at [facility] [address], [place], [place] [address]    Report Status PENDING  Incomplete  Blood culture (routine x 2)     Status: None (Preliminary result)   Collection Time: 02/28/21  6:00 AM   Specimen: BLOOD  Result Value Ref Range Status   Specimen Description   Final    BLOOD BLOOD RIGHT HAND Performed at [facility] [address], [place], [place] [address]    Special Requests   Final    BOTTLES DRAWN AEROBIC AND ANAEROBIC Blood Culture adequate volume Performed at [place] [address], [place], [place] [address]    Culture   Final    NO GROWTH 2 DAYS Performed at [facility] [address] [place], [place] [address]    Report Status PENDING  Incomplete  Resp Panel by RT-PCR (Flu A&B, Covid) Nasopharyngeal Swab     Status: None   Collection Time: 02/28/21  9:22 AM   Specimen: Nasopharyngeal Swab; Nasopharyngeal(NP) swabs in vial transport medium  Result Value Ref Range Status   SARS Coronavirus 2 by RT PCR NEGATIVE NEGATIVE Final    Comment: (NOTE) SARS-CoV-2 target nucleic acids are NOT  DETECTED.  The SARS-CoV-2 RNA is generally detectable in upper respiratory specimens during the acute phase of infection. The lowest concentration of SARS-CoV-2 viral copies this assay can detect is 138 copies/mL. A negative result does not preclude SARS-Cov-2 infection and should not be used as the sole basis for treatment or other patient management decisions. A negative result may occur with  improper specimen collection/handling, submission of specimen other than nasopharyngeal swab, presence of viral mutation(s) within the areas targeted by this assay, and inadequate number of viral copies(<138 copies/mL). A negative result must be combined with clinical observations, patient history, and epidemiological information. The expected result is Negative.  Fact Sheet for Patients:  EntrepreneurPulse.com.au  Fact Sheet for Healthcare Providers:  IncredibleEmployment.be  This test is no t yet approved or cleared by the [place] FDA and  has been authorized for detection and/or diagnosis of SARS-CoV-2 by FDA under an Emergency Use Authorization (EUA). This EUA will remain  in effect (meaning this test can be used) for the duration of the COVID-19 declaration under Section 564(b)(1) of the Act, 21 U.S.C.section 360bbb-3(b)(1), unless the authorization is terminated  or revoked sooner.       Influenza A by PCR NEGATIVE NEGATIVE Final   Influenza B by PCR NEGATIVE NEGATIVE Final    Comment: (NOTE) The Xpert Xpress SARS-CoV-2/FLU/RSV plus assay is intended as an aid in the diagnosis of influenza from Nasopharyngeal swab specimens and should not be used as a sole basis for treatment. Nasal washings and aspirates are unacceptable for Xpert Xpress SARS-CoV-2/FLU/RSV testing.  Fact Sheet for Patients: EntrepreneurPulse.com.au  Fact Sheet for Healthcare Providers: IncredibleEmployment.be  This test is not yet  approved or cleared by the [place] FDA and has been authorized for detection and/or diagnosis of SARS-CoV-2 by FDA under an Emergency Use Authorization (EUA). This EUA will remain in effect (meaning this test can be used) for the duration of the COVID-19 declaration under Section 564(b)(1) of the Act, 21 U.S.C. section 360bbb-3(b)(1), unless the authorization is terminated or revoked.  Performed at [facility], [place] [address], [place], [place] [address]  Procedures/Studies: DG Chest 2 View  Result Date: 02/28/2021 CLINICAL DATA:  Concern for pneumonia EXAM: CHEST - 2 VIEW COMPARISON:  Chest x-ray dated February 28, 2021 FINDINGS: Normal heart size. Tortuosity of the thoracic aorta, similar to prior exams. Linear opacity of the left mid lung, favored to be due to atelectasis. No definite left lower lung opacity. Lateral view is limited. No evidence of pleural effusion or pneumothorax. IMPRESSION: 1. Linear opacity of the left mid lung, likely due to atelectasis. 2. No definite left lower lung opacity, however lateral view is limited. Electronically Signed   By: Yetta Glassman M.D.   On: 02/28/2021 08:29   DG Chest Port 1 View  Result Date: 02/28/2021 CLINICAL DATA:  88 year old female with possible sepsis. Altered mental status, fever, shortness of breath. EXAM: PORTABLE CHEST 1 VIEW COMPARISON:  Portable chest 01/12/2021 and earlier. FINDINGS: Portable AP semi upright view at 0627 hours. Mildly lower lung volumes. Mediastinal contours remain within normal limits. Calcified aortic atherosclerosis. Visualized tracheal air column is within normal limits. Difficult to exclude acute peribronchial opacity at the left lung base. But elsewhere Allowing for portable technique the lungs are clear. Cholecystectomy clips. Negative visible bowel gas. No acute osseous abnormality identified. IMPRESSION: Lower lung volumes since December with possible acute opacity at the left lung  base. Consider atelectasis, pneumonia, or aspiration in this setting. PA and lateral views would be helpful when feasible. Electronically Signed   By: Genevie Ann M.D.   On: 02/28/2021 06:48     Time coordinating discharge: Over 30 minutes    Dwyane Dee, MD  [facility] 03/02/2021, 12:35 PM

## 2021-03-02 NOTE — TOC Transition Note (Signed)
Transition of Care [facility]) - CM/SW Discharge Note   Patient Details  Name: Kristina Wilkinson MRN: [id number] Date of Birth: March 20, 1927  Transition of Care [facility]) CM/SW Contact:  Lynnell Catalan, RN Phone Number: 03/02/2021, 3:19 PM   Clinical Narrative:     Pt to dc back to [facility] today. [facility] contacted for transport. RN to call report to [phone].   Final next level of care: Long Term Nursing Home Barriers to Discharge: Continued Medical Work up     Discharge Plan and Services   Discharge Planning Services: CM Consult               Readmission Risk Interventions Readmission Risk Prevention Plan 03/01/2021  Transportation Screening Complete  PCP or Specialist Appt within 3-5 Days Complete  HRI or Home Care Consult Complete  Social Work Consult for [place] Planning/Counseling Complete  Palliative Care Screening Not Applicable  Medication Review [employer]) Complete  Some recent data might be hidden

## 2021-03-02 NOTE — [place] FL2 (Signed)
[place] MEDICAID FL2 LEVEL OF CARE SCREENING TOOL     IDENTIFICATION  Patient Name: Kristina Wilkinson Birthdate: [birth year] Sex: female Admission Date (Current Location): 02/28/2021  [facility] and [place] Number:  Herbalist and Address:  [facility],  [address], [place]      Provider Number: [id number]  Attending Physician Name and Address:  Dwyane Dee, MD  Relative Name and Phone Number:       Current Level of Care: Hospital Recommended Level of Care: [place] Prior Approval Number:    Date Approved/Denied:   PASRR Number:    Discharge Plan: SNF    Current Diagnoses: Patient Active Problem List   Diagnosis Date Noted   Pressure injury of skin 03/01/2021   Sepsis secondary to UTI ([place]) 02/28/2021   Dementia ([place]) 02/28/2021   Normocytic anemia 02/28/2021   Chronic atrial fibrillation ([place]) 09/25/2020   Restless leg syndrome 03/14/2019   Sepsis due to gram-negative UTI ([place]) 03/06/2019   Sepsis due to urinary tract infection ([place]) 03/05/2019   Falls 03/05/2019   Open toe wound 12/01/2018   Traumatic rhabdomyolysis ([place]) 11/30/2018   Cellulitis 11/30/2018   Transaminitis 11/30/2018   Abnormal urinalysis 11/30/2018   Leukocytosis 11/30/2018   Hypokalemia 11/30/2018   Scalp laceration    Skull fracture ([place]) 07/13/2016   Chronic left shoulder pain 04/03/2016   Type 2 diabetes mellitus ([place]) 06/27/2015   Hearing loss 06/21/2015   Advance care planning 12/17/2014   Palpitations 07/06/2014   Pain in joint, ankle and foot 04/20/2014   DVT, lower extremity ([place]) 11/19/2013   Edema 10/20/2013   Headache(784.0) 10/20/2013   Left leg swelling 10/20/2013   Aneurysm of basilar artery (HCC) 10/20/2013   Bradycardia 10/20/2013   Vitamin B12 deficiency 07/16/2013   Paresthesia of both feet 05/17/2013   TMJ arthritis 09/11/2012   Ear pain 09/11/2012   History of transfusion of packed red blood cells 09/11/2012    Spinal stenosis 04/13/2011   Well adult exam 01/23/2011   Cholecystitis with cholelithiasis, s/p lap chole 11/10/2010 12/01/2010   Shoulder pain, right 07/25/2010   TOBACCO USE, QUIT 03/09/2009   KNEE PAIN 05/18/2008   PN (peripheral neuropathy) 08/19/2007   LEG PAIN 08/19/2007   Vitamin D deficiency 02/17/2007   INSOMNIA, PERSISTENT 02/17/2007   HTN (hypertension) 02/17/2007   GERD 02/17/2007   Osteoarthritis 02/17/2007   LOW BACK PAIN 02/17/2007   Hyperglycemia 01/28/2007   UNSPECIFIED OSTEOPOROSIS 01/28/2007   PRECIPITOUS DROP IN HEMATOCRIT 01/28/2007    Orientation RESPIRATION BLADDER Height & Weight     Self  Normal Incontinent Weight: 65.2 kg Height:  5\' 6"  (167.6 cm)  BEHAVIORAL SYMPTOMS/MOOD NEUROLOGICAL BOWEL NUTRITION STATUS      Continent Diet (Heart Healthy)  AMBULATORY STATUS COMMUNICATION OF NEEDS Skin   Extensive Assist Verbally Skin abrasions                       Personal Care Assistance Level of Assistance  Bathing, Feeding, Dressing Bathing Assistance: Limited assistance Feeding assistance: Limited assistance Dressing Assistance: Limited assistance     Functional Limitations Info  Sight, Hearing, Speech Sight Info: Adequate Hearing Info: Adequate Speech Info: Adequate    SPECIAL CARE FACTORS FREQUENCY                       Contractures      Additional Factors Info  Code Status, Allergies Code Status Info: Full Allergies  Info: Gabapentin, Penicillins, Pregabalin, Vit D-Vit E- safflower oil           Current Medications (03/02/2021):  This is the current hospital active medication list Current Facility-Administered Medications  Medication Dose Route Frequency Provider Last Rate Last Admin   acetaminophen (TYLENOL) tablet 650 mg  650 mg Oral Q6H PRN Reubin Milan, MD       Or   acetaminophen (TYLENOL) suppository 650 mg  650 mg Rectal Q6H PRN Reubin Milan, MD       albuterol (PROVENTIL) (2.5 MG/3ML) 0.083%  nebulizer solution 2.5 mg  2.5 mg Nebulization Q4H PRN Reubin Milan, MD       amLODipine (NORVASC) tablet 10 mg  10 mg Oral Daily Reubin Milan, MD   10 mg at 03/02/21 0938   apixaban (ELIQUIS) tablet 5 mg  5 mg Oral BID Reubin Milan, MD   5 mg at 03/02/21 1829   bisacodyl (DULCOLAX) suppository 10 mg  10 mg Rectal Daily PRN Reubin Milan, MD       busPIRone (BUSPAR) tablet 5 mg  5 mg Oral BID Reubin Milan, MD   5 mg at 03/02/21 0929   lisinopril (ZESTRIL) tablet 40 mg  40 mg Oral Daily Reubin Milan, MD   40 mg at 03/02/21 9371   metoprolol succinate (TOPROL-XL) 24 hr tablet 12.5 mg  12.5 mg Oral Daily Reubin Milan, MD       ondansetron [facility]) tablet 4 mg  4 mg Oral Q6H PRN Reubin Milan, MD       Or   ondansetron [facility]) injection 4 mg  4 mg Intravenous Q6H PRN Reubin Milan, MD       pantoprazole (PROTONIX) EC tablet 40 mg  40 mg Oral Daily Reubin Milan, MD   40 mg at 03/02/21 0929   polyethylene glycol (MIRALAX / GLYCOLAX) packet 17 g  17 g Oral BID Reubin Milan, MD   17 g at 03/02/21 0930   polyvinyl alcohol (LIQUIFILM TEARS) 1.4 % ophthalmic solution 1 drop  1 drop Both Eyes Q8H PRN Reubin Milan, MD       risperiDONE (RISPERDAL) tablet 0.5 mg  0.5 mg Oral BID PRN Reubin Milan, MD   0.5 mg at 02/28/21 1848   senna (SENOKOT) tablet 8.6 mg  1 tablet Oral BID Reubin Milan, MD   8.6 mg at 03/02/21 0930     Discharge Medications: Please see discharge summary for a list of discharge medications.  Relevant Imaging Results:  Relevant Lab Results:   Additional Information SSN [id number].  Korrey Schleicher, Marjie Skiff, RN

## 2021-03-02 NOTE — Progress Notes (Signed)
Report called and given to [facility] the Nurse Manager/ Charge nurse at the receiving facility. Report received and Dawn stated that she will handoff report to the staff nurse.

## 2021-03-05 LAB — CULTURE, BLOOD (ROUTINE X 2)
Culture: NO GROWTH
Culture: NO GROWTH
Special Requests: ADEQUATE
Special Requests: ADEQUATE

## 2021-03-27 ENCOUNTER — Other Ambulatory Visit: Payer: Self-pay

## 2021-03-27 ENCOUNTER — Emergency Department (HOSPITAL_COMMUNITY)
Admission: EM | Admit: 2021-03-27 | Discharge: 2021-03-28 | Disposition: A | Discharge status: Nursing Home (Includes ICF) | Payer: Medicare Other | Source: Home / Self Care | Attending: Emergency Medicine | Admitting: Emergency Medicine

## 2021-03-27 ENCOUNTER — Emergency Department (HOSPITAL_COMMUNITY): Payer: Medicare Other

## 2021-03-27 DIAGNOSIS — Z20822 Contact with and (suspected) exposure to covid-19: Secondary | ICD-10-CM | POA: Diagnosis present

## 2021-03-27 DIAGNOSIS — G9341 Metabolic encephalopathy: Secondary | ICD-10-CM | POA: Diagnosis present

## 2021-03-27 DIAGNOSIS — I10 Essential (primary) hypertension: Secondary | ICD-10-CM | POA: Diagnosis present

## 2021-03-27 DIAGNOSIS — E1142 Type 2 diabetes mellitus with diabetic polyneuropathy: Secondary | ICD-10-CM | POA: Diagnosis present

## 2021-03-27 DIAGNOSIS — Z7901 Long term (current) use of anticoagulants: Secondary | ICD-10-CM | POA: Insufficient documentation

## 2021-03-27 DIAGNOSIS — R627 Adult failure to thrive: Secondary | ICD-10-CM | POA: Diagnosis present

## 2021-03-27 DIAGNOSIS — Z87891 Personal history of nicotine dependence: Secondary | ICD-10-CM | POA: Diagnosis not present

## 2021-03-27 DIAGNOSIS — D72828 Other elevated white blood cell count: Secondary | ICD-10-CM | POA: Diagnosis present

## 2021-03-27 DIAGNOSIS — I482 Chronic atrial fibrillation, unspecified: Secondary | ICD-10-CM | POA: Diagnosis present

## 2021-03-27 DIAGNOSIS — E119 Type 2 diabetes mellitus without complications: Secondary | ICD-10-CM | POA: Diagnosis present

## 2021-03-27 DIAGNOSIS — Z79899 Other long term (current) drug therapy: Secondary | ICD-10-CM

## 2021-03-27 DIAGNOSIS — N390 Urinary tract infection, site not specified: Secondary | ICD-10-CM | POA: Diagnosis present

## 2021-03-27 DIAGNOSIS — L89626 Pressure-induced deep tissue damage of left heel: Secondary | ICD-10-CM | POA: Diagnosis present

## 2021-03-27 DIAGNOSIS — Z86718 Personal history of other venous thrombosis and embolism: Secondary | ICD-10-CM

## 2021-03-27 DIAGNOSIS — K219 Gastro-esophageal reflux disease without esophagitis: Secondary | ICD-10-CM | POA: Diagnosis present

## 2021-03-27 DIAGNOSIS — L89322 Pressure ulcer of left buttock, stage 2: Secondary | ICD-10-CM | POA: Diagnosis present

## 2021-03-27 DIAGNOSIS — F0390 Unspecified dementia without behavioral disturbance: Secondary | ICD-10-CM | POA: Diagnosis present

## 2021-03-27 DIAGNOSIS — E860 Dehydration: Secondary | ICD-10-CM | POA: Diagnosis present

## 2021-03-27 DIAGNOSIS — K5900 Constipation, unspecified: Secondary | ICD-10-CM | POA: Diagnosis present

## 2021-03-27 DIAGNOSIS — R4182 Altered mental status, unspecified: Secondary | ICD-10-CM | POA: Insufficient documentation

## 2021-03-27 DIAGNOSIS — E870 Hyperosmolality and hypernatremia: Secondary | ICD-10-CM | POA: Diagnosis present

## 2021-03-27 DIAGNOSIS — Z9049 Acquired absence of other specified parts of digestive tract: Secondary | ICD-10-CM

## 2021-03-27 DIAGNOSIS — Z823 Family history of stroke: Secondary | ICD-10-CM

## 2021-03-27 DIAGNOSIS — L89812 Pressure ulcer of head, stage 2: Secondary | ICD-10-CM | POA: Diagnosis present

## 2021-03-27 DIAGNOSIS — L89616 Pressure-induced deep tissue damage of right heel: Secondary | ICD-10-CM | POA: Diagnosis present

## 2021-03-27 MED ORDER — SODIUM CHLORIDE 0.9 % IV SOLN: INTRAVENOUS | Status: DC

## 2021-03-27 NOTE — ED Provider Notes (Signed)
[facility] DEPT Provider Note   CSN: [id number] Arrival date & time: 03/27/21  2123     History  Chief Complaint  Patient presents with   Altered Mental Status    BIB EMS for AMS after staff noticed that patient was more confused than her usual. No other complaints, noted.    CECYLIA BRAZILL is a 88 y.o. female.  HPI  88 year old female with a history of DVT, CNS aneurysm, arthritis, diabetes, hypertension, cognitive impairment, who presents to the emergency department today for evaluation of altered mental status.  There is a level 5 caveat as patient is confused and unable to answer any questions or follow commands.  11:15 PM spoke with staff at [facility].  They state that today the patient has had increased confusion, hallucinations and has not been responding appropriately.  At baseline she is normally conversant and can follow commands. She is normally oriented to self and place.  She has not had any fevers or illnesses that they are aware of recently. Hx of confusion but she can normally conversant and follow commands.   Home Medications Prior to Admission medications   Medication Sig Start Date End Date Taking? Authorizing Provider  acetaminophen (TYLENOL) 325 MG tablet Take 2 tablets (650 mg total) by mouth every 6 (six) hours as needed for mild pain (or Fever >/= 101). 10/10/20   Sheikh, Omair Latif, DO  albuterol (PROVENTIL) (2.5 MG/3ML) 0.083% nebulizer solution Take 3 mLs (2.5 mg total) by nebulization every 4 (four) hours as needed for wheezing or shortness of breath. 10/10/20   Raiford Noble Latif, DO  amLODipine (NORVASC) 10 MG tablet Take 1 tablet (10 mg total) by mouth daily. 10/10/20   Raiford Noble Latif, DO  apixaban (ELIQUIS) 5 MG TABS tablet Take 1 tablet (5 mg total) by mouth 2 (two) times daily. 10/10/20   Raiford Noble Latif, DO  bisacodyl (DULCOLAX) 10 MG suppository Place 1 suppository (10 mg total) rectally daily as  needed for moderate constipation. 10/10/20   Sheikh, Georgina Quint Latif, DO  busPIRone (BUSPAR) 5 MG tablet Take 5 mg by mouth in the morning and at bedtime.    [provider]  lisinopril (ZESTRIL) 40 MG tablet Take 1 tablet (40 mg total) by mouth daily. 10/10/20   Raiford Noble Latif, DO  Lorazepam POWD Apply 1 mg topically See admin instructions. Apply 1 mg of gel (after reconstituted) onto the skin three times a day as needed for anxiety or agitation    [provider]  metoprolol succinate (TOPROL-XL) 25 MG 24 hr tablet Take 0.5 tablets (12.5 mg total) by mouth daily. 10/10/20   Sheikh, Georgina Quint Latif, DO  polyethylene glycol (MIRALAX / GLYCOLAX) 17 g packet Take 17 g by mouth 2 (two) times daily. 10/10/20   Raiford Noble Latif, DO  polyvinyl alcohol (LIQUIFILM TEARS) 1.4 % ophthalmic solution Place 1 drop into both eyes as needed for dry eyes. Patient taking differently: Place 1 drop into both eyes every 8 (eight) hours as needed for dry eyes. 10/10/20   Raiford Noble Latif, DO  potassium chloride SA (KLOR-CON M) 20 MEQ tablet Take 40 mEq by mouth daily. 02/20/21   [provider]  risperiDONE (RISPERDAL) 0.5 MG tablet Take 0.5 mg by mouth 2 (two) times daily as needed (hallucination). 01/23/21   [provider]  senna (SENOKOT) 8.6 MG TABS tablet Take 1 tablet by mouth in the morning and at bedtime.    [provider]  ZOFRAN 4 MG tablet Take 4 mg by mouth every 6 (six) hours as needed for nausea.    [provider]      Allergies    Gabapentin, Other, Penicillins, Pregabalin, and Vit d-vit e-safflower oil    Review of Systems   Review of Systems See HPI for pertinent positives or negatives.   Physical Exam Updated Vital Signs BP (!) 189/63    Pulse (!) 102    Temp (!) 97.4 F (36.3 C) (Rectal)    Resp 15    Ht 5\' 6"  (1.676 m)    Wt 63.3 kg    SpO2 97%    BMI 22.52 kg/m  Physical Exam Vitals and nursing note reviewed.  Constitutional:       General: She is not in acute distress.    Appearance: She is well-developed.  HENT:     Head: Normocephalic and atraumatic.  Eyes:     Conjunctiva/sclera: Conjunctivae normal.  Cardiovascular:     Rate and Rhythm: Normal rate and regular rhythm.     Heart sounds: Normal heart sounds. No murmur heard. Pulmonary:     Effort: Pulmonary effort is normal. No respiratory distress.     Breath sounds: Normal breath sounds.  Abdominal:     General: Bowel sounds are normal.     Palpations: Abdomen is soft.     Tenderness: There is no abdominal tenderness. There is no guarding or rebound.  Musculoskeletal:        General: No swelling.     Cervical back: Neck supple.  Skin:    General: Skin is warm and dry.     Capillary Refill: Capillary refill takes less than 2 seconds.  Neurological:     Mental Status: She is alert.     Comments: Alert, demented. Not able to answer questions or follow commands. No facial droop. Moving all extremities.  Psychiatric:        Mood and Affect: Mood normal.    ED Results / Procedures / Treatments   Labs (all labs ordered are listed, but only abnormal results are displayed) Labs Reviewed  CBC WITH DIFFERENTIAL/PLATELET - Abnormal; Notable for the following components:      Result Value   Abs Immature Granulocytes 0.12 (*)    All other components within normal limits  COMPREHENSIVE METABOLIC PANEL - Abnormal; Notable for the following components:   Glucose, Bld 114 (*)    BUN 33 (*)    Calcium 10.5 (*)    All other components within normal limits  URINALYSIS, ROUTINE W REFLEX MICROSCOPIC - Abnormal; Notable for the following components:   Ketones, ur 5 (*)    Leukocytes,Ua TRACE (*)    Bacteria, UA RARE (*)    All other components within normal limits  RESP PANEL BY RT-PCR (FLU A&B, COVID) [id number]  URINE CULTURE    EKG EKG Interpretation  Date/Time:  Tuesday March 28 2021 02:07:08 EST Ventricular Rate:  91 PR Interval:  203 QRS  Duration: 88 QT Interval:  386 QTC Calculation: 475 R Axis:   -24 Text Interpretation: Sinus rhythm Atrial premature complex Borderline left axis deviation Confirmed by Quintella Reichert [phone]) on 03/28/2021 2:14:51 AM  Radiology DG Chest 1 View  Result Date: 03/28/2021 CLINICAL DATA:  Altered mental status. EXAM: CHEST  1 VIEW COMPARISON:  Chest radiograph dated 02/28/2021. FINDINGS: No focal consolidation, pleural effusion, pneumothorax. The cardiac silhouette is within normal limits. Atherosclerotic calcification of the aorta. No acute osseous pathology. Degenerative changes of the  spine. IMPRESSION: No active cardiopulmonary disease. Electronically Signed   By: Anner Crete M.D.   On: 03/28/2021 01:36   CT Head Wo Contrast  Result Date: 03/27/2021 CLINICAL DATA:  Mental status change, unknown cause. EXAM: CT HEAD WITHOUT CONTRAST TECHNIQUE: Contiguous axial images were obtained from the base of the skull through the vertex without intravenous contrast. RADIATION DOSE REDUCTION: This exam was performed according to the departmental dose-optimization program which includes automated exposure control, adjustment of the mA and/or kV according to patient size and/or use of iterative reconstruction technique. COMPARISON:  01/12/2021. FINDINGS: Brain: No acute intracranial hemorrhage, midline shift or mass effect. No extra-axial fluid collection. Periventricular and subcortical white matter hypodensities are noted bilaterally. No hydrocephalus. Mild atrophy is noted. Old infarcts are noted in the cerebellum bilaterally. A stable coarse calcification is present in the CP angle on the left. Vascular: No hyperdense vessel or unexpected calcification. Skull: Stable postsurgical changes are present in the occipital bones bilaterally with surgical clips and aneurysm clip at the foramen magnum. No acute fracture. Sinuses/Orbits: No acute finding. Other: None. IMPRESSION: 1. No acute intracranial process. 2.  Stable postsurgical changes and aneurysm clipping in the posterior fossa. 3. Atrophy with chronic microvascular ischemic changes and bilateral cerebellar infarcts. Electronically Signed   By: Brett Fairy M.D.   On: 03/27/2021 22:49    Procedures Procedures    Medications Ordered in ED Medications  0.9 %  sodium chloride infusion ( Intravenous New Bag/Given 03/28/21 0030)  LORazepam (ATIVAN) injection 0.5 mg (0.5 mg Intravenous Given 03/28/21 0203)    ED Course/ Medical Decision Making/ A&P                           Medical Decision Making Amount and/or Complexity of Data Reviewed Labs: ordered. Radiology: ordered.  Risk Prescription drug management.   This patient presents to the ED for concern of AMS, this involves an extensive number of treatment options, and is a complaint that carries with it a high risk of complications and morbidity.  The differential diagnosis includes but is not limited to [place], CVA, infectious process, delirium   Comorbidities that complicate the patient evaluation: Patients presentation is complicated by their history of dementia, dm, VTE, htn  Additional history obtained: Additional history obtained from nursing home/care facility Records reviewed previous admission documents and Care Everywhere/External Records  Lab Tests: I Ordered, and personally interpreted labs.  The pertinent results include:   CBC wnl CMP with mildly elevated BUN, cr wnl, lfts wnl UA with ketonuria, trace leuks, 0-5 rbc/wbs and rare bacteria. Urine culture.  COVID/flu negative  Imaging Studies ordered: I ordered, independently visualized, and interpreted imaging which showed   CT scan head - 1. No acute intracranial process. 2. Stable postsurgical changes and aneurysm clipping in the posterior fossa. 3. Atrophy with chronic microvascular ischemic changes and bilateral cerebellar infarcts.     I agree with the radiologist interpretation  Cardiac Monitoring: The patient  was maintained on a cardiac monitor.  I personally viewed and interpreted the cardiac monitor which showed an underlying rhythm of:  sinus rhythm  Medicines ordered and prescription drug management: I ordered medication including ativan  for agitation  Reevaluation of the patient after these medicines showed that the patient    improved  Complexity of problems addressed: Patients presentation is most consistent with  acute complicated illness/injury requiring diagnostic workup  Disposition: After consideration of the diagnostic results and the patients response to  treatment,  I feel that the patent would benefit from home with close follow-up.  Patient's work-up here is reassuring.  CT head did not show any acute findings.  Her infectious work-up is negative.  Her labs are reassuring and show may be just a slight amount of dehydration.  She was given fluids for this.  I discussed the case with her POA who is comfortable with the plan for her being discharged given no emergent findings today.  Patient was also evaluated by my supervising physician, Dr. Ralene Bathe who is in agreement with the plan.    Final Clinical Impression(s) / ED Diagnoses Final diagnoses:  Altered mental status, unspecified altered mental status type    Rx / DC Orders ED Discharge Orders     None         Rodney Booze, PA-C 03/28/21 0409    Daleen Bo, MD 03/28/21 1228

## 2021-03-27 NOTE — ED Triage Notes (Signed)
BIB EMS for AMS after staff noticed that patient was more confused than her usual. No other complaints, noted.

## 2021-03-28 ENCOUNTER — Other Ambulatory Visit: Payer: Self-pay

## 2021-03-28 ENCOUNTER — Inpatient Hospital Stay (HOSPITAL_COMMUNITY)
Admission: EM | Admit: 2021-03-28 | Discharge: 2021-04-01 | DRG: 640 | Disposition: A | Discharge status: Skilled Nursing Facility | Payer: Medicare Other | Source: Skilled Nursing Facility | Attending: Internal Medicine | Admitting: Internal Medicine

## 2021-03-28 ENCOUNTER — Emergency Department (HOSPITAL_COMMUNITY): Payer: Medicare Other

## 2021-03-28 ENCOUNTER — Encounter (HOSPITAL_COMMUNITY): Payer: Self-pay | Admitting: *Deleted

## 2021-03-28 DIAGNOSIS — Z87891 Personal history of nicotine dependence: Secondary | ICD-10-CM

## 2021-03-28 DIAGNOSIS — Z86718 Personal history of other venous thrombosis and embolism: Secondary | ICD-10-CM

## 2021-03-28 DIAGNOSIS — E860 Dehydration: Principal | ICD-10-CM | POA: Diagnosis present

## 2021-03-28 DIAGNOSIS — Z9049 Acquired absence of other specified parts of digestive tract: Secondary | ICD-10-CM

## 2021-03-28 DIAGNOSIS — N390 Urinary tract infection, site not specified: Secondary | ICD-10-CM | POA: Diagnosis present

## 2021-03-28 DIAGNOSIS — I10 Essential (primary) hypertension: Secondary | ICD-10-CM | POA: Diagnosis present

## 2021-03-28 DIAGNOSIS — L89626 Pressure-induced deep tissue damage of left heel: Secondary | ICD-10-CM | POA: Diagnosis present

## 2021-03-28 DIAGNOSIS — Z7901 Long term (current) use of anticoagulants: Secondary | ICD-10-CM

## 2021-03-28 DIAGNOSIS — F03918 Unspecified dementia, unspecified severity, with other behavioral disturbance: Secondary | ICD-10-CM

## 2021-03-28 DIAGNOSIS — G9340 Encephalopathy, unspecified: Secondary | ICD-10-CM | POA: Diagnosis present

## 2021-03-28 DIAGNOSIS — L89616 Pressure-induced deep tissue damage of right heel: Secondary | ICD-10-CM | POA: Diagnosis present

## 2021-03-28 DIAGNOSIS — I482 Chronic atrial fibrillation, unspecified: Secondary | ICD-10-CM

## 2021-03-28 DIAGNOSIS — R4182 Altered mental status, unspecified: Secondary | ICD-10-CM

## 2021-03-28 DIAGNOSIS — G9341 Metabolic encephalopathy: Secondary | ICD-10-CM | POA: Diagnosis present

## 2021-03-28 DIAGNOSIS — F0390 Unspecified dementia without behavioral disturbance: Secondary | ICD-10-CM

## 2021-03-28 DIAGNOSIS — E870 Hyperosmolality and hypernatremia: Secondary | ICD-10-CM | POA: Diagnosis present

## 2021-03-28 DIAGNOSIS — E119 Type 2 diabetes mellitus without complications: Secondary | ICD-10-CM

## 2021-03-28 DIAGNOSIS — D72828 Other elevated white blood cell count: Secondary | ICD-10-CM | POA: Diagnosis present

## 2021-03-28 DIAGNOSIS — K5900 Constipation, unspecified: Secondary | ICD-10-CM | POA: Diagnosis present

## 2021-03-28 DIAGNOSIS — L89812 Pressure ulcer of head, stage 2: Secondary | ICD-10-CM | POA: Diagnosis present

## 2021-03-28 DIAGNOSIS — E1142 Type 2 diabetes mellitus with diabetic polyneuropathy: Secondary | ICD-10-CM | POA: Diagnosis present

## 2021-03-28 DIAGNOSIS — L89322 Pressure ulcer of left buttock, stage 2: Secondary | ICD-10-CM | POA: Diagnosis present

## 2021-03-28 DIAGNOSIS — Z79899 Other long term (current) drug therapy: Secondary | ICD-10-CM

## 2021-03-28 DIAGNOSIS — K219 Gastro-esophageal reflux disease without esophagitis: Secondary | ICD-10-CM | POA: Diagnosis present

## 2021-03-28 DIAGNOSIS — R627 Adult failure to thrive: Secondary | ICD-10-CM | POA: Diagnosis present

## 2021-03-28 DIAGNOSIS — W19XXXA Unspecified fall, initial encounter: Secondary | ICD-10-CM

## 2021-03-28 DIAGNOSIS — Z20822 Contact with and (suspected) exposure to covid-19: Secondary | ICD-10-CM | POA: Diagnosis present

## 2021-03-28 DIAGNOSIS — Z823 Family history of stroke: Secondary | ICD-10-CM

## 2021-03-28 LAB — CBC WITH DIFFERENTIAL/PLATELET
Abs Immature Granulocytes: 0.03 10*3/uL (ref 0.00–0.07)
Abs Immature Granulocytes: 0.12 10*3/uL — ABNORMAL HIGH (ref 0.00–0.07)
Basophils Absolute: 0 10*3/uL (ref 0.0–0.1)
Basophils Absolute: 0 10*3/uL (ref 0.0–0.1)
Basophils Relative: 0 %
Basophils Relative: 0 %
Eosinophils Absolute: 0 10*3/uL (ref 0.0–0.5)
Eosinophils Absolute: 0.1 10*3/uL (ref 0.0–0.5)
Eosinophils Relative: 0 %
Eosinophils Relative: 1 %
HCT: 39.8 % (ref 36.0–46.0)
HCT: 40.6 % (ref 36.0–46.0)
Hemoglobin: 12.9 g/dL (ref 12.0–15.0)
Hemoglobin: 13.1 g/dL (ref 12.0–15.0)
Immature Granulocytes: 0 %
Immature Granulocytes: 1 %
Lymphocytes Relative: 13 %
Lymphocytes Relative: 21 %
Lymphs Abs: 1.5 10*3/uL (ref 0.7–4.0)
Lymphs Abs: 1.9 10*3/uL (ref 0.7–4.0)
MCH: 28.8 pg (ref 26.0–34.0)
MCH: 29.2 pg (ref 26.0–34.0)
MCHC: 32.3 g/dL (ref 30.0–36.0)
MCHC: 32.4 g/dL (ref 30.0–36.0)
MCV: 88.8 fL (ref 80.0–100.0)
MCV: 90.4 fL (ref 80.0–100.0)
Monocytes Absolute: 0.9 10*3/uL (ref 0.1–1.0)
Monocytes Absolute: 1 10*3/uL (ref 0.1–1.0)
Monocytes Relative: 11 %
Monocytes Relative: 8 %
Neutro Abs: 6.1 10*3/uL (ref 1.7–7.7)
Neutro Abs: 8.6 10*3/uL — ABNORMAL HIGH (ref 1.7–7.7)
Neutrophils Relative %: 66 %
Neutrophils Relative %: 79 %
Platelets: 185 10*3/uL (ref 150–400)
Platelets: 194 10*3/uL (ref 150–400)
RBC: 4.48 MIL/uL (ref 3.87–5.11)
RBC: 4.49 MIL/uL (ref 3.87–5.11)
RDW: 14.5 % (ref 11.5–15.5)
RDW: 14.8 % (ref 11.5–15.5)
WBC: 11 10*3/uL — ABNORMAL HIGH (ref 4.0–10.5)
WBC: 9.2 10*3/uL (ref 4.0–10.5)
nRBC: 0 % (ref 0.0–0.2)
nRBC: 0 % (ref 0.0–0.2)

## 2021-03-28 LAB — COMPREHENSIVE METABOLIC PANEL
ALT: 33 U/L (ref 0–44)
AST: 30 U/L (ref 15–41)
Albumin: 4 g/dL (ref 3.5–5.0)
Alkaline Phosphatase: 113 U/L (ref 38–126)
Anion gap: 8 (ref 5–15)
BUN: 33 mg/dL — ABNORMAL HIGH (ref 8–23)
CO2: 22 mmol/L (ref 22–32)
Calcium: 10.5 mg/dL — ABNORMAL HIGH (ref 8.9–10.3)
Chloride: 111 mmol/L (ref 98–111)
Creatinine, Ser: 0.74 mg/dL (ref 0.44–1.00)
GFR, Estimated: >60 mL/min (ref >60)
Glucose, Bld: 114 mg/dL — ABNORMAL HIGH (ref 70–99)
Potassium: 4.3 mmol/L (ref 3.5–5.1)
Sodium: 141 mmol/L (ref 135–145)
Total Bilirubin: 0.4 mg/dL (ref 0.3–1.2)
Total Protein: 7 g/dL (ref 6.5–8.1)

## 2021-03-28 LAB — RESP PANEL BY RT-PCR (FLU A&B, COVID) ARPGX2
Influenza A by PCR: NEGATIVE
Influenza B by PCR: NEGATIVE
SARS Coronavirus 2 by RT PCR: NEGATIVE

## 2021-03-28 LAB — URINALYSIS, ROUTINE W REFLEX MICROSCOPIC
Bilirubin Urine: NEGATIVE
Glucose, UA: NEGATIVE mg/dL
Hgb urine dipstick: NEGATIVE
Ketones, ur: 5 mg/dL — AB
Nitrite: NEGATIVE
Protein, ur: NEGATIVE mg/dL
Specific Gravity, Urine: 1.019 (ref 1.005–1.030)
pH: 5 (ref 5.0–8.0)

## 2021-03-28 MED ORDER — SODIUM CHLORIDE 0.9 % IV BOLUS
1000.0000 mL | Freq: Once | INTRAVENOUS | Status: AC
  Administered 2021-03-29: 1000 mL via INTRAVENOUS

## 2021-03-28 MED ORDER — LORAZEPAM 2 MG/ML IJ SOLN
0.5000 mg | Freq: Once | INTRAMUSCULAR | Status: AC
  Administered 2021-03-28: 0.5 mg via INTRAVENOUS
  Filled 2021-03-28: qty 1

## 2021-03-28 MED ORDER — FENTANYL CITRATE PF 50 MCG/ML IJ SOSY
25.0000 ug | PREFILLED_SYRINGE | Freq: Once | INTRAMUSCULAR | Status: AC
Start: 2021-03-28 — End: 2021-03-29
  Administered 2021-03-29: 25 ug via INTRAVENOUS
  Filled 2021-03-28: qty 1

## 2021-03-28 NOTE — ED Provider Notes (Signed)
[place] DEPT Provider Note   CSN: [id number] Arrival date & time: 03/28/21  2215     History  Chief Complaint  Patient presents with   Altered Mental Status    Kristina Wilkinson is a 88 y.o. female.  HPI   88 y/o female with a h/o VTE, CNS aneurysm, arthritis, DM, HTN, insomnia, knee pain, low back pain, who presents to the ED today for eval of AMS And concern for hip fx. Per EMS there is was no report of a fall.   Level 5 caveat, pt with dementia.  10:44 PM Attempted to contact [facility] and there was no answer x2  10:46 PM Discussed case with the patients family member. He states he was told by [facility] that pt may have a broken bone and that they think she had a stroke. They did not inform him of any known falls or other concerns.  11:19 PM Spoke with Parke Simmers with [facility] nursing staff. She states pts AMS is worse today and now she is less talkative. She is not aware of any falls but does agree that the patients rle had no deformity yesterday when she sent her to the hospital  Home Medications Prior to Admission medications   Medication Sig Start Date End Date Taking? Authorizing Provider  acetaminophen (TYLENOL) 325 MG tablet Take 2 tablets (650 mg total) by mouth every 6 (six) hours as needed for mild pain (or Fever >/= 101). 10/10/20   Sheikh, Omair Latif, DO  albuterol (PROVENTIL) (2.5 MG/3ML) 0.083% nebulizer solution Take 3 mLs (2.5 mg total) by nebulization every 4 (four) hours as needed for wheezing or shortness of breath. 10/10/20   Raiford Noble Latif, DO  amLODipine (NORVASC) 10 MG tablet Take 1 tablet (10 mg total) by mouth daily. 10/10/20   Raiford Noble Latif, DO  apixaban (ELIQUIS) 5 MG TABS tablet Take 1 tablet (5 mg total) by mouth 2 (two) times daily. 10/10/20   Raiford Noble Latif, DO  bisacodyl (DULCOLAX) 10 MG suppository Place 1 suppository (10 mg total) rectally daily as needed for moderate constipation. 10/10/20    Sheikh, Georgina Quint Latif, DO  busPIRone (BUSPAR) 5 MG tablet Take 5 mg by mouth in the morning and at bedtime.    [provider]  lisinopril (ZESTRIL) 40 MG tablet Take 1 tablet (40 mg total) by mouth daily. 10/10/20   Raiford Noble Latif, DO  Lorazepam POWD Apply 1 mg topically See admin instructions. Apply 1 mg of gel (after reconstituted) onto the skin three times a day as needed for anxiety or agitation    [provider]  metoprolol succinate (TOPROL-XL) 25 MG 24 hr tablet Take 0.5 tablets (12.5 mg total) by mouth daily. 10/10/20   Sheikh, Georgina Quint Latif, DO  polyethylene glycol (MIRALAX / GLYCOLAX) 17 g packet Take 17 g by mouth 2 (two) times daily. 10/10/20   Raiford Noble Latif, DO  polyvinyl alcohol (LIQUIFILM TEARS) 1.4 % ophthalmic solution Place 1 drop into both eyes as needed for dry eyes. Patient taking differently: Place 1 drop into both eyes every 8 (eight) hours as needed for dry eyes. 10/10/20   Raiford Noble Latif, DO  potassium chloride SA (KLOR-CON M) 20 MEQ tablet Take 40 mEq by mouth daily. 02/20/21   [provider]  risperiDONE (RISPERDAL) 0.5 MG tablet Take 0.5 mg by mouth 2 (two) times daily as needed (hallucination). 01/23/21   [provider]  senna (SENOKOT) 8.6 MG TABS tablet Take 1  tablet by mouth in the morning and at bedtime.    [provider]  ZOFRAN 4 MG tablet Take 4 mg by mouth every 6 (six) hours as needed for nausea.    [provider]      Allergies    Gabapentin, Other, Penicillins, Pregabalin, and Vit d-vit e-safflower oil    Review of Systems   Review of Systems See HPI for pertinent positives or negatives.   Physical Exam Updated Vital Signs BP (!) 153/69    Pulse 90    Temp 97.7 F (36.5 C) (Rectal)    Resp 19    SpO2 94%  Physical Exam Vitals and nursing note reviewed.  Constitutional:      General: She is not in acute distress.    Appearance: She is well-developed.  HENT:     Head: Normocephalic  and atraumatic.     Mouth/Throat:     Mouth: Mucous membranes are dry.  Eyes:     Conjunctiva/sclera: Conjunctivae normal.  Cardiovascular:     Rate and Rhythm: Normal rate and regular rhythm.     Heart sounds: Normal heart sounds. No murmur heard. Pulmonary:     Effort: Pulmonary effort is normal. No respiratory distress.     Breath sounds: Wheezing (end expiratory wheezes) present.  Abdominal:     General: Bowel sounds are normal.     Palpations: Abdomen is soft.     Tenderness: There is abdominal tenderness (mild ruq and epigastric ttp).  Genitourinary:    Comments: Dre performed. Soft stool noted in the rectal vault. No impaction noted. Musculoskeletal:        General: No swelling.     Cervical back: Neck supple.     Comments: RLE is rotated inward. Pt moans with any movement of the RLE and has some mild ttp to the right hip  Skin:    General: Skin is warm and dry.     Capillary Refill: Capillary refill takes less than 2 seconds.  Neurological:     Mental Status: She is alert.  Psychiatric:        Mood and Affect: Mood normal.    ED Results / Procedures / Treatments   Labs (all labs ordered are listed, but only abnormal results are displayed) Labs Reviewed  CBC WITH DIFFERENTIAL/PLATELET - Abnormal; Notable for the following components:      Result Value   WBC 11.0 (*)    Neutro Abs 8.6 (*)    All other components within normal limits  COMPREHENSIVE METABOLIC PANEL - Abnormal; Notable for the following components:   Chloride 112 (*)    Glucose, Bld 143 (*)    BUN 42 (*)    Calcium 10.5 (*)    GFR, Estimated 54 (*)    All other components within normal limits  COMPREHENSIVE METABOLIC PANEL - Abnormal; Notable for the following components:   Chloride 114 (*)    CO2 17 (*)    Glucose, Bld 110 (*)    BUN 38 (*)    Total Protein 6.3 (*)    Albumin 3.4 (*)    All other components within normal limits  CBC WITH DIFFERENTIAL/PLATELET - Abnormal; Notable for the  following components:   WBC 11.7 (*)    Neutro Abs 7.9 (*)    Monocytes Absolute 1.4 (*)    All other components within normal limits  RESP PANEL BY RT-PCR (FLU A&B, COVID) ARPGX2  CULTURE, BLOOD (ROUTINE X 2)  CULTURE, BLOOD (ROUTINE X 2)  LACTIC ACID, PLASMA  MAGNESIUM  URINALYSIS, ROUTINE W REFLEX MICROSCOPIC    EKG EKG Interpretation  Date/Time:  Tuesday March 28 2021 23:16:59 EST Ventricular Rate:  84 PR Interval:  214 QRS Duration: 85 QT Interval:  384 QTC Calculation: 452 R Axis:   44 Text Interpretation: Sinus or ectopic atrial rhythm Borderline prolonged PR interval Consider left atrial enlargement wandering baseline Confirmed by Thayer Jew [phone]) on 03/28/2021 11:20:38 PM  Radiology DG Chest 1 View  Result Date: 03/28/2021 CLINICAL DATA:  Fall EXAM: CHEST  1 VIEW COMPARISON:  Chest x-ray 03/28/2021 1:31 a.m., Chest x-ray 11/30/2018 FINDINGS: The heart and mediastinal contours are unchanged. Aortic calcification. Slightly prominent hilar vasculature likely due to low lung volumes. Low lung volumes. No focal consolidation. Chronic coarsened interstitial markings. No pleural effusion. No pneumothorax. No acute osseous abnormality. IMPRESSION: Low lung volumes with no active disease. Electronically Signed   By: Iven Finn M.D.   On: 03/28/2021 23:46   DG Chest 1 View  Result Date: 03/28/2021 CLINICAL DATA:  Altered mental status. EXAM: CHEST  1 VIEW COMPARISON:  Chest radiograph dated 02/28/2021. FINDINGS: No focal consolidation, pleural effusion, pneumothorax. The cardiac silhouette is within normal limits. Atherosclerotic calcification of the aorta. No acute osseous pathology. Degenerative changes of the spine. IMPRESSION: No active cardiopulmonary disease. Electronically Signed   By: Anner Crete M.D.   On: 03/28/2021 01:36   CT Head Wo Contrast  Result Date: 03/28/2021 CLINICAL DATA:  Head trauma, moderate-severe EXAM: CT HEAD WITHOUT CONTRAST  TECHNIQUE: Contiguous axial images were obtained from the base of the skull through the vertex without intravenous contrast. RADIATION DOSE REDUCTION: This exam was performed according to the departmental dose-optimization program which includes automated exposure control, adjustment of the mA and/or kV according to patient size and/or use of iterative reconstruction technique. COMPARISON:  03/27/2021 FINDINGS: Brain: Old bilateral cerebellar infarcts, stable. There is atrophy and chronic small vessel disease changes. No acute intracranial abnormality. Specifically, no hemorrhage, hydrocephalus, mass lesion, acute infarction, or significant intracranial injury. Vascular: No hyperdense vessel or unexpected calcification. Skull: No acute calvarial abnormality. Sinuses/Orbits: No acute findings Other: None IMPRESSION: Old bilateral cerebellar infarcts. Atrophy, chronic microvascular disease. No acute intracranial abnormality. Electronically Signed   By: Rolm Baptise M.D.   On: 03/28/2021 23:04   CT Head Wo Contrast  Result Date: 03/27/2021 CLINICAL DATA:  Mental status change, unknown cause. EXAM: CT HEAD WITHOUT CONTRAST TECHNIQUE: Contiguous axial images were obtained from the base of the skull through the vertex without intravenous contrast. RADIATION DOSE REDUCTION: This exam was performed according to the departmental dose-optimization program which includes automated exposure control, adjustment of the mA and/or kV according to patient size and/or use of iterative reconstruction technique. COMPARISON:  01/12/2021. FINDINGS: Brain: No acute intracranial hemorrhage, midline shift or mass effect. No extra-axial fluid collection. Periventricular and subcortical white matter hypodensities are noted bilaterally. No hydrocephalus. Mild atrophy is noted. Old infarcts are noted in the cerebellum bilaterally. A stable coarse calcification is present in the CP angle on the left. Vascular: No hyperdense vessel or  unexpected calcification. Skull: Stable postsurgical changes are present in the occipital bones bilaterally with surgical clips and aneurysm clip at the foramen magnum. No acute fracture. Sinuses/Orbits: No acute finding. Other: None. IMPRESSION: 1. No acute intracranial process. 2. Stable postsurgical changes and aneurysm clipping in the posterior fossa. 3. Atrophy with chronic microvascular ischemic changes and bilateral cerebellar infarcts. Electronically Signed   By: Brett Fairy M.D.   On:  03/27/2021 22:49   CT Cervical Spine Wo Contrast  Result Date: 03/28/2021 CLINICAL DATA:  Neck trauma EXAM: CT CERVICAL SPINE WITHOUT CONTRAST TECHNIQUE: Multidetector CT imaging of the cervical spine was performed without intravenous contrast. Multiplanar CT image reconstructions were also generated. RADIATION DOSE REDUCTION: This exam was performed according to the departmental dose-optimization program which includes automated exposure control, adjustment of the mA and/or kV according to patient size and/or use of iterative reconstruction technique. COMPARISON:  12/11/2020 FINDINGS: Alignment: No subluxation. Skull base and vertebrae: No acute fracture. No primary bone lesion or focal pathologic process. Soft tissues and spinal canal: No prevertebral fluid or swelling. No visible canal hematoma. Disc levels: Diffuse advanced degenerative disc disease. Moderate diffuse degenerative facet disease bilaterally. Upper chest: No acute findings Other: None IMPRESSION: Diffuse advanced degenerative disc and facet disease. No acute bony abnormality. Electronically Signed   By: Rolm Baptise M.D.   On: 03/28/2021 23:59   CT ABDOMEN PELVIS W CONTRAST  Result Date: 03/29/2021 CLINICAL DATA:  Epigastric and right upper quadrant pain EXAM: CT ABDOMEN AND PELVIS WITH CONTRAST TECHNIQUE: Multidetector CT imaging of the abdomen and pelvis was performed using the standard protocol following bolus administration of intravenous  contrast. RADIATION DOSE REDUCTION: This exam was performed according to the departmental dose-optimization program which includes automated exposure control, adjustment of the mA and/or kV according to patient size and/or use of iterative reconstruction technique. CONTRAST:  91mL OMNIPAQUE IOHEXOL 300 MG/ML  SOLN COMPARISON:  None. FINDINGS: LOWER CHEST: Normal. HEPATOBILIARY: Normal hepatic contours. No intra- or extrahepatic biliary dilatation. Status post cholecystectomy. PANCREAS: Normal pancreas. No ductal dilatation or peripancreatic fluid collection. SPLEEN: Normal. ADRENALS/URINARY TRACT: The adrenal glands are normal. No hydronephrosis. Bilateral calcifications in the renal pelvis. No ureteral obstruction. Small amount of gas in the anterior urinary bladder. STOMACH/BOWEL: There is no hiatal hernia. Normal duodenal course and caliber. There is a small ventral abdominal hernia that contains a loop of nonobstructed small bowel. Large stool ball in the rectum. Normal appendix. VASCULAR/LYMPHATIC: There is calcific atherosclerosis of the abdominal aorta. No lymphadenopathy. REPRODUCTIVE: Small amount of fluid in the endometrial cavity. No adnexal mass. MUSCULOSKELETAL. No bony spinal canal stenosis or focal osseous abnormality. OTHER: Subcutaneous gas in the right posterior tissues superficial to the gluteal muscles. IMPRESSION: 1. No acute abnormality of the abdomen or pelvis. 2. Small ventral abdominal hernia that contains a loop of nonobstructed small bowel. 3. Large stool ball in the rectum. 4. Small amount of gas in the anterior urinary bladder, query recent instrumentation. 5. Subcutaneous gas in the right posterior tissues superficial to the gluteal muscles. Aortic Atherosclerosis (ICD10-I70.0). Electronically Signed   By: Ulyses Jarred M.D.   On: 03/29/2021 02:19   DG Hip Unilat W or Wo Pelvis 2-3 Views Right  Result Date: 03/28/2021 CLINICAL DATA:  fall EXAM: DG HIP (WITH OR WITHOUT PELVIS) 2-3V  RIGHT COMPARISON:  None. FINDINGS: Internal rotation of the right hip. There is no evidence of hip fracture or dislocation of the right hip. Frontal view of the left hip unremarkable. No acute displaced fracture or diastasis of the bones of the pelvis. There is no evidence of arthropathy or other focal bone abnormality. Degenerative changes of visualized lower lumbar spine. Stool within the rectum.  Atherosclerotic plaque. IMPRESSION: 1. No acute displaced fracture or dislocation. 2.  Aortic Atherosclerosis (ICD10-I70.0). Electronically Signed   By: Iven Finn M.D.   On: 03/28/2021 23:45    Procedures Procedures    Medications Ordered in ED  Medications  sodium chloride (PF) 0.9 % injection (has no administration in time range)  acetaminophen (TYLENOL) tablet 650 mg (has no administration in time range)    Or  acetaminophen (TYLENOL) suppository 650 mg (has no administration in time range)  fentaNYL (SUBLIMAZE) injection 25 mcg (25 mcg Intravenous Given 03/29/21 0047)  sodium chloride 0.9 % bolus 1,000 mL (0 mLs Intravenous Stopped 03/29/21 0157)  iohexol (OMNIPAQUE) 300 MG/ML solution 80 mL (80 mLs Intravenous Contrast Given 03/29/21 0204)    ED Course/ Medical Decision Making/ A&P                           Medical Decision Making Amount and/or Complexity of Data Reviewed Labs: ordered. Radiology: ordered.  Risk Prescription drug management. Decision regarding hospitalization.   This patient presents to the ED for concern of AMS, this involves an extensive number of treatment options, and is a complaint that carries with it a high risk of complications and morbidity.  The differential diagnosis includes but is not limited to metabolic encephalopathy, infectious process, cva, ich   Comorbidities that complicate the patient evaluation: Patients presentation is complicated by their history of dementia  Additional history obtained: Additional history obtained from family, EMS , and  nursing home/care facility Records reviewed previous admission documents and Care Everywhere/External Records  Lab Tests: I Ordered, and personally interpreted labs.  The pertinent results include:   CBC with mild leukocytosis, no anemia CMP with low bicarb, normal aniong gap, normal cr and lfts UA pending on admission COVID/flu neg from yesterday Lactic acid negative Blood cultures pending  Imaging Studies ordered: I ordered, independently visualized, and interpreted imaging which showed  CT head - Old bilateral cerebellar infarcts. Atrophy, chronic microvascular disease. No acute intracranial abnormality. CT cervical spine - Diffuse advanced degenerative disc and facet disease. No acute bony abnormality CXR -  No active cardiopulmonary disease Xray pelvis - 1. No acute displaced fracture or dislocation. 2.  Aortic Atherosclerosis CT abd/pelvis - 1. No acute abnormality of the abdomen or pelvis. 2. Small ventral abdominal hernia that contains a loop of nonobstructed small bowel. 3. Large stool ball in the rectum. 4. Small amount of gas in the anterior urinary bladder, query recent instrumentation. 5. Subcutaneous gas in the right posterior tissues superficial to the gluteal muscles. Aortic Atherosclerosis  - soft stool in the rectal vault, no impaction  - in and out cath yesterday explains gas in bladder  - no pelvic fx/hip fx  I agree with the radiologist interpretation  Cardiac Monitoring: The patient was maintained on a cardiac monitor.  I personally viewed and interpreted the cardiac monitor which showed an underlying rhythm of:  sinus rhythm   Consultations Obtained: 3:01 AM I consulted with the consultant Dr. Velia Meyer , and discussed  findings as well as pertinent plan - they recommend: admission  Complexity of problems addressed: Patients presentation is most consistent with  acute presentation with potential threat to life or bodily function  Disposition: After  consideration of the diagnostic results and the patients response to treatment,  I feel that the patent would benefit from admission for AMS/delirium. Unclear cause but will need further w/u likely with mri. Blood cultures and urine culture pending. No clear infectious cause of sxs. .    Final Clinical Impression(s) / ED Diagnoses Final diagnoses:  Fall  Altered mental status, unspecified altered mental status type    Rx / DC Orders ED Discharge Orders  None         Rodney Booze, PA-C 03/29/21 0459    Drenda Freeze, MD 03/29/21 [phone]

## 2021-03-28 NOTE — ED Notes (Signed)
Patient transported to CT 

## 2021-03-28 NOTE — Discharge Instructions (Signed)
Please follow up with your primary care provider within 5-7 days for re-evaluation of your symptoms. If you do not have a primary care provider, information for a healthcare clinic has been provided for you to make arrangements for follow up care. Please return to the emergency department for any new or worsening symptoms. ° °

## 2021-03-28 NOTE — ED Notes (Signed)
[facility] called for transportation back to [facility]

## 2021-03-28 NOTE — ED Triage Notes (Signed)
Pt was seen here last night for the same and medically cleared, the staff at the facility says her mental status is getting worse and they want her further evaluated

## 2021-03-28 NOTE — ED Triage Notes (Signed)
Pt arrives from [facility] facility. Per medic report, staff sent out for AMS. Pt recently started on abx, unsure why. Also, pt noted to have internal rotation of the LLE, staff denied pt had any type of injury or fall. Pt screams when trying to move extremity. En route 140/90, hr irregular 90's, cbg 152.

## 2021-03-28 NOTE — ED Notes (Signed)
[facility]AR here for transportation back, attempted to call [facility] with no answer

## 2021-03-29 ENCOUNTER — Emergency Department (HOSPITAL_COMMUNITY): Payer: Medicare Other

## 2021-03-29 ENCOUNTER — Encounter (HOSPITAL_COMMUNITY): Payer: Self-pay

## 2021-03-29 DIAGNOSIS — K5900 Constipation, unspecified: Secondary | ICD-10-CM | POA: Diagnosis present

## 2021-03-29 DIAGNOSIS — K219 Gastro-esophageal reflux disease without esophagitis: Secondary | ICD-10-CM | POA: Diagnosis present

## 2021-03-29 DIAGNOSIS — L89626 Pressure-induced deep tissue damage of left heel: Secondary | ICD-10-CM | POA: Diagnosis present

## 2021-03-29 DIAGNOSIS — D72828 Other elevated white blood cell count: Secondary | ICD-10-CM | POA: Diagnosis present

## 2021-03-29 DIAGNOSIS — R627 Adult failure to thrive: Secondary | ICD-10-CM | POA: Diagnosis present

## 2021-03-29 DIAGNOSIS — Z515 Encounter for palliative care: Secondary | ICD-10-CM | POA: Diagnosis not present

## 2021-03-29 DIAGNOSIS — G9340 Encephalopathy, unspecified: Secondary | ICD-10-CM | POA: Diagnosis not present

## 2021-03-29 DIAGNOSIS — E860 Dehydration: Secondary | ICD-10-CM | POA: Diagnosis present

## 2021-03-29 DIAGNOSIS — Z7901 Long term (current) use of anticoagulants: Secondary | ICD-10-CM | POA: Diagnosis not present

## 2021-03-29 DIAGNOSIS — Z823 Family history of stroke: Secondary | ICD-10-CM | POA: Diagnosis not present

## 2021-03-29 DIAGNOSIS — Z87891 Personal history of nicotine dependence: Secondary | ICD-10-CM | POA: Diagnosis not present

## 2021-03-29 DIAGNOSIS — Z86718 Personal history of other venous thrombosis and embolism: Secondary | ICD-10-CM | POA: Diagnosis not present

## 2021-03-29 DIAGNOSIS — G9341 Metabolic encephalopathy: Secondary | ICD-10-CM | POA: Diagnosis present

## 2021-03-29 DIAGNOSIS — E119 Type 2 diabetes mellitus without complications: Secondary | ICD-10-CM | POA: Diagnosis present

## 2021-03-29 DIAGNOSIS — Z20822 Contact with and (suspected) exposure to covid-19: Secondary | ICD-10-CM | POA: Diagnosis present

## 2021-03-29 DIAGNOSIS — L89322 Pressure ulcer of left buttock, stage 2: Secondary | ICD-10-CM | POA: Diagnosis present

## 2021-03-29 DIAGNOSIS — L89616 Pressure-induced deep tissue damage of right heel: Secondary | ICD-10-CM | POA: Diagnosis present

## 2021-03-29 DIAGNOSIS — E1142 Type 2 diabetes mellitus with diabetic polyneuropathy: Secondary | ICD-10-CM | POA: Diagnosis present

## 2021-03-29 DIAGNOSIS — Z79899 Other long term (current) drug therapy: Secondary | ICD-10-CM | POA: Diagnosis not present

## 2021-03-29 DIAGNOSIS — F0390 Unspecified dementia without behavioral disturbance: Secondary | ICD-10-CM | POA: Diagnosis present

## 2021-03-29 DIAGNOSIS — E870 Hyperosmolality and hypernatremia: Secondary | ICD-10-CM | POA: Diagnosis present

## 2021-03-29 DIAGNOSIS — Z9049 Acquired absence of other specified parts of digestive tract: Secondary | ICD-10-CM | POA: Diagnosis not present

## 2021-03-29 DIAGNOSIS — L89812 Pressure ulcer of head, stage 2: Secondary | ICD-10-CM | POA: Diagnosis present

## 2021-03-29 DIAGNOSIS — N390 Urinary tract infection, site not specified: Secondary | ICD-10-CM | POA: Diagnosis present

## 2021-03-29 DIAGNOSIS — I10 Essential (primary) hypertension: Secondary | ICD-10-CM | POA: Diagnosis present

## 2021-03-29 DIAGNOSIS — I482 Chronic atrial fibrillation, unspecified: Secondary | ICD-10-CM | POA: Diagnosis present

## 2021-03-29 LAB — COMPREHENSIVE METABOLIC PANEL
ALT: 31 U/L (ref 0–44)
ALT: 28 U/L (ref 0–44)
AST: 31 U/L (ref 15–41)
AST: 33 U/L (ref 15–41)
Albumin: 3.9 g/dL (ref 3.5–5.0)
Albumin: 3.4 g/dL — ABNORMAL LOW (ref 3.5–5.0)
Alkaline Phosphatase: 116 U/L (ref 38–126)
Alkaline Phosphatase: 102 U/L (ref 38–126)
Anion gap: 9 (ref 5–15)
Anion gap: 11 (ref 5–15)
BUN: 42 mg/dL — ABNORMAL HIGH (ref 8–23)
BUN: 38 mg/dL — ABNORMAL HIGH (ref 8–23)
CO2: 23 mmol/L (ref 22–32)
CO2: 17 mmol/L — ABNORMAL LOW (ref 22–32)
Calcium: 10.5 mg/dL — ABNORMAL HIGH (ref 8.9–10.3)
Calcium: 9.5 mg/dL (ref 8.9–10.3)
Chloride: 112 mmol/L — ABNORMAL HIGH (ref 98–111)
Chloride: 114 mmol/L — ABNORMAL HIGH (ref 98–111)
Creatinine, Ser: 0.99 mg/dL (ref 0.44–1.00)
Creatinine, Ser: 0.77 mg/dL (ref 0.44–1.00)
GFR, Estimated: 54 mL/min — ABNORMAL LOW (ref >60)
GFR, Estimated: >60 mL/min (ref >60)
Glucose, Bld: 143 mg/dL — ABNORMAL HIGH (ref 70–99)
Glucose, Bld: 110 mg/dL — ABNORMAL HIGH (ref 70–99)
Potassium: 4.3 mmol/L (ref 3.5–5.1)
Potassium: 4.4 mmol/L (ref 3.5–5.1)
Sodium: 144 mmol/L (ref 135–145)
Sodium: 142 mmol/L (ref 135–145)
Total Bilirubin: 0.4 mg/dL (ref 0.3–1.2)
Total Bilirubin: 0.7 mg/dL (ref 0.3–1.2)
Total Protein: 7 g/dL (ref 6.5–8.1)
Total Protein: 6.3 g/dL — ABNORMAL LOW (ref 6.5–8.1)

## 2021-03-29 LAB — URINALYSIS, ROUTINE W REFLEX MICROSCOPIC
Bilirubin Urine: NEGATIVE
Glucose, UA: NEGATIVE mg/dL
Hgb urine dipstick: NEGATIVE
Ketones, ur: 5 mg/dL — AB
Nitrite: NEGATIVE
Protein, ur: NEGATIVE mg/dL
Specific Gravity, Urine: 1.039 — ABNORMAL HIGH (ref 1.005–1.030)
pH: 5 (ref 5.0–8.0)

## 2021-03-29 LAB — CBC WITH DIFFERENTIAL/PLATELET
Abs Immature Granulocytes: 0.05 10*3/uL (ref 0.00–0.07)
Basophils Absolute: 0 10*3/uL (ref 0.0–0.1)
Basophils Relative: 0 %
Eosinophils Absolute: 0.1 10*3/uL (ref 0.0–0.5)
Eosinophils Relative: 1 %
HCT: 37.7 % (ref 36.0–46.0)
Hemoglobin: 12.3 g/dL (ref 12.0–15.0)
Immature Granulocytes: 0 %
Lymphocytes Relative: 18 %
Lymphs Abs: 2.1 10*3/uL (ref 0.7–4.0)
MCH: 29.5 pg (ref 26.0–34.0)
MCHC: 32.6 g/dL (ref 30.0–36.0)
MCV: 90.4 fL (ref 80.0–100.0)
Monocytes Absolute: 1.4 10*3/uL — ABNORMAL HIGH (ref 0.1–1.0)
Monocytes Relative: 12 %
Neutro Abs: 7.9 10*3/uL — ABNORMAL HIGH (ref 1.7–7.7)
Neutrophils Relative %: 69 %
Platelets: 187 10*3/uL (ref 150–400)
RBC: 4.17 MIL/uL (ref 3.87–5.11)
RDW: 14.7 % (ref 11.5–15.5)
WBC: 11.7 10*3/uL — ABNORMAL HIGH (ref 4.0–10.5)
nRBC: 0 % (ref 0.0–0.2)

## 2021-03-29 LAB — RESP PANEL BY RT-PCR (FLU A&B, COVID) ARPGX2
Influenza A by PCR: NEGATIVE
Influenza B by PCR: NEGATIVE
SARS Coronavirus 2 by RT PCR: NEGATIVE

## 2021-03-29 LAB — MAGNESIUM: Magnesium: 2.1 mg/dL (ref 1.7–2.4)

## 2021-03-29 LAB — URINE CULTURE: Culture: <10000 — AB

## 2021-03-29 LAB — LACTIC ACID, PLASMA: Lactic Acid, Venous: 1.6 mmol/L (ref 0.5–1.9)

## 2021-03-29 MED ORDER — BUSPIRONE HCL 5 MG PO TABS
5.0000 mg | ORAL_TABLET | Freq: Two times a day (BID) | ORAL | Status: DC
  Administered 2021-03-31 (×2): 5 mg via ORAL
  Filled 2021-03-29 (×3): qty 1

## 2021-03-29 MED ORDER — METOPROLOL SUCCINATE ER 25 MG PO TB24
12.5000 mg | ORAL_TABLET | Freq: Every day | ORAL | Status: DC
  Administered 2021-03-31: 12.5 mg via ORAL
  Filled 2021-03-29: qty 0.5
  Filled 2021-03-29 (×2): qty 1

## 2021-03-29 MED ORDER — IOHEXOL 300 MG/ML  SOLN
80.0000 mL | Freq: Once | INTRAMUSCULAR | Status: AC | PRN
  Administered 2021-03-29: 80 mL via INTRAVENOUS

## 2021-03-29 MED ORDER — ACETAMINOPHEN 650 MG RE SUPP: 650.0000 mg | Freq: Four times a day (QID) | RECTAL | Status: DC | PRN

## 2021-03-29 MED ORDER — LISINOPRIL 20 MG PO TABS
40.0000 mg | ORAL_TABLET | Freq: Every day | ORAL | Status: DC
  Administered 2021-03-31: 40 mg via ORAL
  Filled 2021-03-29: qty 4
  Filled 2021-03-29 (×2): qty 2

## 2021-03-29 MED ORDER — APIXABAN 5 MG PO TABS: 5.0000 mg | ORAL_TABLET | Freq: Two times a day (BID) | ORAL | Status: DC

## 2021-03-29 MED ORDER — LACTATED RINGERS IV SOLN: Freq: Once | INTRAVENOUS | Status: AC

## 2021-03-29 MED ORDER — ONDANSETRON HCL 4 MG PO TABS: 4.0000 mg | ORAL_TABLET | Freq: Four times a day (QID) | ORAL | Status: DC | PRN

## 2021-03-29 MED ORDER — SODIUM CHLORIDE (PF) 0.9 % IJ SOLN
INTRAMUSCULAR | Status: AC
  Administered 2021-03-29: 10 mL
  Filled 2021-03-29: qty 50

## 2021-03-29 MED ORDER — SODIUM CHLORIDE 0.9 % IV SOLN
1.0000 g | INTRAVENOUS | Status: AC
  Administered 2021-03-29 – 2021-04-01 (×4): 1 g via INTRAVENOUS
  Filled 2021-03-29 (×4): qty 10

## 2021-03-29 MED ORDER — ACETAMINOPHEN 325 MG PO TABS: 650.0000 mg | ORAL_TABLET | Freq: Four times a day (QID) | ORAL | Status: DC | PRN

## 2021-03-29 MED ORDER — AMLODIPINE BESYLATE 10 MG PO TABS
10.0000 mg | ORAL_TABLET | Freq: Every day | ORAL | Status: DC
  Administered 2021-03-31: 10 mg via ORAL
  Filled 2021-03-29: qty 1
  Filled 2021-03-29: qty 2
  Filled 2021-03-29: qty 1

## 2021-03-29 MED ORDER — ONDANSETRON HCL 4 MG/2ML IJ SOLN: 4.0000 mg | Freq: Four times a day (QID) | INTRAMUSCULAR | Status: DC | PRN

## 2021-03-29 MED ORDER — BISACODYL 10 MG RE SUPP
10.0000 mg | Freq: Every day | RECTAL | Status: DC
  Administered 2021-03-29 – 2021-03-31 (×3): 10 mg via RECTAL
  Filled 2021-03-29 (×4): qty 1

## 2021-03-29 MED ORDER — POLYETHYLENE GLYCOL 3350 17 G PO PACK
17.0000 g | PACK | Freq: Two times a day (BID) | ORAL | Status: DC
  Administered 2021-03-31 (×2): 17 g via ORAL
  Filled 2021-03-29 (×3): qty 1

## 2021-03-29 NOTE — Progress Notes (Signed)
Carryover admission to the Day Admitter.  I discussed this case with the EDP, [place], [place].  Per these discussions:   This is a 88 year old female who is being admitted for acute encephalopathy of unclear source.  She has presented to [facility] ED from SNF each of the last 2 days with altered mental status in the form of confusion relative to baseline.  On 03/27/2021, after benign work-up in the ED, she was discharged back to SNF, staff at that facility felt that the patient's mental status continued to worsen, prompting the patient to be brought back to [facility] long ED this evening for further evaluation of this altered mental status.  I have placed order to admit to med telemetry, and have placed some preliminary admit orders via the adult multi admission order set. I left the patient NPO pending nursing bedside swallow screen.    Babs Bertin, DO Hospitalist

## 2021-03-29 NOTE — ED Notes (Signed)
Patient transported to CT 

## 2021-03-29 NOTE — H&P (Signed)
History and Physical    Patient: Kristina Wilkinson TKZ:[id number] DOB: [birth year] DOA: 03/28/2021 DOS: the patient was seen and examined on 03/29/2021 PCP: Pcp, No  Patient coming from: SNF  Chief Complaint:  Chief Complaint  Patient presents with   Altered Mental Status    HPI: Kristina Wilkinson is a 88 y.o. female with medical history significant of DM2, GERD, CNS aneurysm s/p clipping, DVT, HTN. Presenting with altered mental status. History is from chart refuse as patient is confused and not cooperating with interview. She was sent from [facility] for concern of altered mental status and right hip fracture. They are not aware of any falls at the facility, but she appears to have a deformity of the right hip. They note that she has been more confused recently. She is less talkative and interactive.  ED w/u revealed stable vitals, a mildly elevated white count, and decreased bicarb. TRH was called for admission.    Review of Systems: unable to review all systems due to the inability of the patient to answer questions. Past Medical History:  Diagnosis Date   Arthritis    CNS aneurysm 1983   s/p clipping   Diabetes mellitus type II    diet controlled    DVT (deep venous thrombosis) (HCC) 10/15   Elevated glucose    Hypertension    Insomnia    Knee pain    Left Dr. Meyer Cory   LBP (low back pain)    PN (peripheral neuropathy)    unknown etiol   Past Surgical History:  Procedure Laterality Date   CEREBRAL ANEURYSM REPAIR     initial repair in the 1980s, with second surgery in the [place]  04/2009   Left   TOTAL KNEE ARTHROPLASTY     TUBAL LIGATION     Social History:  reports that she has quit smoking. She has never used smokeless tobacco. She reports that she does not drink alcohol and does not use drugs.  Allergies  Allergen Reactions   Gabapentin Itching and Other (See Comments)    Made the legs "go numb" and  "allergic," per [facility]   Other Other (See Comments)    "MD mixed a blood pressure medication with the Clinoril I was taking and I couldn't walk" "made me achy and sick"   Penicillins Other (See Comments) and Itching    Has patient had a PCN reaction causing immediate rash, facial/tongue/throat swelling, SOB or lightheadedness with hypotension: Unk- exact reaction not recalled, but is allergic Has patient had a PCN reaction causing severe rash involving mucus membranes or skin necrosis: Unk Has patient had a PCN reaction that required hospitalization: Unk Has patient had a PCN reaction occurring within the last 10 years: Yes If all of the above answers are "NO", then may proceed with Cephalosporin use.  Itching and rash   Pregabalin Other (See Comments)    "Made me pass out" and "made the limbs go numb"- "allergic," per [facility]   Vit D-Vit E-Safflower Oil Other (See Comments)    "Made me achy and sick"    Family History  Problem Relation Age of Onset   Arthritis Mother    Stroke Mother    Stroke Father    Arthritis-Osteo Son     Prior to Admission medications   Medication Sig Start Date End Date Taking? Authorizing Provider  acetaminophen (TYLENOL) 325 MG tablet Take 2 tablets (650 mg  total) by mouth every 6 (six) hours as needed for mild pain (or Fever >/= 101). 10/10/20   Sheikh, Omair Latif, DO  albuterol (PROVENTIL) (2.5 MG/3ML) 0.083% nebulizer solution Take 3 mLs (2.5 mg total) by nebulization every 4 (four) hours as needed for wheezing or shortness of breath. 10/10/20   Raiford Noble Latif, DO  amLODipine (NORVASC) 10 MG tablet Take 1 tablet (10 mg total) by mouth daily. 10/10/20   Raiford Noble Latif, DO  apixaban (ELIQUIS) 5 MG TABS tablet Take 1 tablet (5 mg total) by mouth 2 (two) times daily. 10/10/20   Raiford Noble Latif, DO  bisacodyl (DULCOLAX) 10 MG suppository Place 1 suppository (10 mg total) rectally daily as needed for moderate constipation. 10/10/20   Sheikh, Georgina Quint Latif, DO   busPIRone (BUSPAR) 5 MG tablet Take 5 mg by mouth in the morning and at bedtime.    [provider]  lisinopril (ZESTRIL) 40 MG tablet Take 1 tablet (40 mg total) by mouth daily. 10/10/20   Raiford Noble Latif, DO  Lorazepam POWD Apply 1 mg topically See admin instructions. Apply 1 mg of gel (after reconstituted) onto the skin three times a day as needed for anxiety or agitation    [provider]  metoprolol succinate (TOPROL-XL) 25 MG 24 hr tablet Take 0.5 tablets (12.5 mg total) by mouth daily. 10/10/20   Sheikh, Georgina Quint Latif, DO  polyethylene glycol (MIRALAX / GLYCOLAX) 17 g packet Take 17 g by mouth 2 (two) times daily. 10/10/20   Raiford Noble Latif, DO  polyvinyl alcohol (LIQUIFILM TEARS) 1.4 % ophthalmic solution Place 1 drop into both eyes as needed for dry eyes. Patient taking differently: Place 1 drop into both eyes every 8 (eight) hours as needed for dry eyes. 10/10/20   Raiford Noble Latif, DO  potassium chloride SA (KLOR-CON M) 20 MEQ tablet Take 40 mEq by mouth daily. 02/20/21   [provider]  risperiDONE (RISPERDAL) 0.5 MG tablet Take 0.5 mg by mouth 2 (two) times daily as needed (hallucination). 01/23/21   [provider]  senna (SENOKOT) 8.6 MG TABS tablet Take 1 tablet by mouth in the morning and at bedtime.    [provider]  ZOFRAN 4 MG tablet Take 4 mg by mouth every 6 (six) hours as needed for nausea.    [provider]    Physical Exam: Vitals:   03/29/21 0545 03/29/21 0700 03/29/21 0800 03/29/21 0900  BP: 140/68 (!) 150/72 125/65 128/69  Pulse: 77 94 92 92  Resp: 14 16 (!) 25 18  Temp:      TempSrc:      SpO2: 97% 93% 94% 94%   General: 88 y.o. female resting in bed in NAD Eyes: PERRL, normal sclera ENMT: Nares patent w/o discharge, orophaynx clear, dentition normal, ears w/o discharge/lesions/ulcers Neck: Supple, trachea midline Cardiovascular: RRR, +S1, S2, no m/g/r, equal pulses throughout Respiratory: CTABL,  no w/r/r, normal WOB GI: BS+, NDNT, no masses noted, no organomegaly noted MSK: No e/c/c; right hip ttp; internally rotated hip is tender w/ movement Neuro: not participating in exam   Data Reviewed:  WBC 11.7 CO2 17 BUN 38 Scr 0.77  CTH is negative for acute process CT c-spine is negative for acute process CT ab/pelvis w/ large stool ball in rectum  Assessment and Plan: No notes have been filed under this hospital service. Service: Hospitalist Acute on chronic metabolic encephalopathy UTI?     - admitted to inpt, tele     -  CTH is negative; no-focality on exam, but not participating well; repeat CTH tomorrow if she is not clearing mentally (has history of CNS aneurysm w/ clips)     - UA shows leukocytes, bac, hazy; has mild white count; start rocephin, follow Ucx     - fluids  Right hip pain?     - XR hip is negative     - PT/OT when mentation is clearing     - relative in chart; report cycles of fall  Constipation     - BM regimen, PRN enema  HTN     - resume home regimen when confirmed  Dementia     - continue home regimen when confirmed  Chronic a fib eliquis Hx of DVT     - continue home regimen when confirmed  DM2     - diet controlled; DM diet after swallow screen  Advance Care Planning: CODE Status: DNI; confirmed with POA by phone  Consults: None  Family Communication: Spoke w/ cousin on phone, [name], her POA  Severity of Illness: The appropriate patient status for this patient is INPATIENT. Inpatient status is judged to be reasonable and necessary in order to provide the required intensity of service to ensure the patient's safety. The patient's presenting symptoms, physical exam findings, and initial radiographic and laboratory data in the context of their chronic comorbidities is felt to place them at high risk for further clinical deterioration. Furthermore, it is not anticipated that the patient will be medically stable for discharge from the  hospital within 2 midnights of admission.   * I certify that at the point of admission it is my clinical judgment that the patient will require inpatient hospital care spanning beyond 2 midnights from the point of admission due to high intensity of service, high risk for further deterioration and high frequency of surveillance required.*  Author: Jonnie Finner, DO 03/29/2021 9:59 AM  For on call review [email or web address].

## 2021-03-29 NOTE — ED Notes (Signed)
Attempted to do swallow eval, pt said "no, no, no" and refused to drink. Will attempt again at another time.

## 2021-03-30 ENCOUNTER — Inpatient Hospital Stay (HOSPITAL_COMMUNITY)
Admit: 2021-03-30 | Discharge: 2021-03-30 | Disposition: A | Discharge status: Home / Self Care | Payer: Medicare Other | Attending: Internal Medicine | Admitting: Internal Medicine

## 2021-03-30 LAB — COMPREHENSIVE METABOLIC PANEL
ALT: 26 U/L (ref 0–44)
AST: 32 U/L (ref 15–41)
Albumin: 3.4 g/dL — ABNORMAL LOW (ref 3.5–5.0)
Alkaline Phosphatase: 96 U/L (ref 38–126)
Anion gap: 9 (ref 5–15)
BUN: 40 mg/dL — ABNORMAL HIGH (ref 8–23)
CO2: 20 mmol/L — ABNORMAL LOW (ref 22–32)
Calcium: 9.8 mg/dL (ref 8.9–10.3)
Chloride: 116 mmol/L — ABNORMAL HIGH (ref 98–111)
Creatinine, Ser: 0.94 mg/dL (ref 0.44–1.00)
GFR, Estimated: 57 mL/min — ABNORMAL LOW (ref >60)
Glucose, Bld: 89 mg/dL (ref 70–99)
Potassium: 4 mmol/L (ref 3.5–5.1)
Sodium: 145 mmol/L (ref 135–145)
Total Bilirubin: 0.8 mg/dL (ref 0.3–1.2)
Total Protein: 6.2 g/dL — ABNORMAL LOW (ref 6.5–8.1)

## 2021-03-30 LAB — CBC
HCT: 36 % (ref 36.0–46.0)
Hemoglobin: 11.3 g/dL — ABNORMAL LOW (ref 12.0–15.0)
MCH: 29.1 pg (ref 26.0–34.0)
MCHC: 31.4 g/dL (ref 30.0–36.0)
MCV: 92.8 fL (ref 80.0–100.0)
Platelets: 179 10*3/uL (ref 150–400)
RBC: 3.88 MIL/uL (ref 3.87–5.11)
RDW: 15.1 % (ref 11.5–15.5)
WBC: 9.4 10*3/uL (ref 4.0–10.5)
nRBC: 0 % (ref 0.0–0.2)

## 2021-03-30 LAB — URINE CULTURE

## 2021-03-30 MED ORDER — LORAZEPAM 2 MG/ML IJ SOLN
1.0000 mg | Freq: Once | INTRAMUSCULAR | Status: AC
  Administered 2021-03-30: 1 mg via INTRAVENOUS
  Filled 2021-03-30: qty 1

## 2021-03-30 MED ORDER — DEXTROSE IN LACTATED RINGERS 5 % IV SOLN: INTRAVENOUS | Status: DC

## 2021-03-30 MED ORDER — LORAZEPAM 2 MG/ML IJ SOLN
0.2500 mg | Freq: Once | INTRAMUSCULAR | Status: AC
  Administered 2021-03-30: 0.25 mg via INTRAVENOUS
  Filled 2021-03-30: qty 1

## 2021-03-30 MED ORDER — APIXABAN 2.5 MG PO TABS
2.5000 mg | ORAL_TABLET | Freq: Two times a day (BID) | ORAL | Status: DC
  Administered 2021-03-31 – 2021-04-01 (×2): 2.5 mg via ORAL
  Filled 2021-03-30 (×3): qty 1

## 2021-03-30 MED ORDER — LABETALOL HCL 5 MG/ML IV SOLN
5.0000 mg | INTRAVENOUS | Status: DC | PRN
  Filled 2021-03-30: qty 4

## 2021-03-30 NOTE — Progress Notes (Signed)
° ° °  OVERNIGHT PROGRESS REPORT  Notified by RN for apparent anxiety/restlessness. Patient is normally on an Ativan powder dosing which is unavailable.  A small dose is ordered - once.   Kristina Wilkinson MSNA MSN ACNPC-AG Acute Care Nurse Practitioner [place]

## 2021-03-30 NOTE — Progress Notes (Signed)
EEG complete - results pending 

## 2021-03-30 NOTE — TOC Initial Note (Signed)
Transition of Care [facility]) - Initial/Assessment Note    Patient Details  Name: Kristina Wilkinson MRN: [id number] Date of Birth: 05-22-1927  [facility] CM/SW Contact:    Mena Lienau, Marjie Skiff, RN Phone Number: 03/30/2021, 2:15 PM  Clinical Narrative:                 Pt is from [facility] long term care and will return there at dc. TOC will continue to follow.  Expected Discharge Plan: [place] Barriers to Discharge: Continued Medical Work up   Expected Discharge Plan and Services Expected Discharge Plan: [place]   Discharge Planning Services: CM Consult   Living arrangements for the past 2 months: [place]                     Prior Living Arrangements/Services Living arrangements for the past 2 months: [place] Lives with:: Facility Resident                Criminal Activity/Legal Involvement Pertinent to Current Situation/Hospitalization: No - Comment as needed  Activities of Daily Living   ADL Screening (condition at time of admission) Is the patient deaf or have difficulty hearing?: No Does the patient have difficulty seeing, even when wearing glasses/contacts?: No Does the patient have difficulty concentrating, remembering, or making decisions?: Yes Does the patient have difficulty dressing or bathing?: Yes Does the patient have difficulty walking or climbing stairs?: Yes     Admission diagnosis:  Fall [W19.XXXA] Acute encephalopathy [G93.40] Altered mental status, unspecified altered mental status type [R41.82] Patient Active Problem List   Diagnosis Date Noted   Acute encephalopathy 03/29/2021   Pressure injury of skin 03/01/2021   Sepsis secondary to UTI ([place]) 02/28/2021   Dementia without behavioral disturbance ([place]) 02/28/2021   Normocytic anemia 02/28/2021   Atrial fibrillation, chronic ([place]) 09/25/2020   Restless leg syndrome 03/14/2019   Sepsis due to gram-negative UTI ([place])  03/06/2019   Sepsis due to urinary tract infection ([place]) 03/05/2019   Falls 03/05/2019   Open toe wound 12/01/2018   Traumatic rhabdomyolysis ([place]) 11/30/2018   Cellulitis 11/30/2018   Transaminitis 11/30/2018   Abnormal urinalysis 11/30/2018   Leukocytosis 11/30/2018   Hypokalemia 11/30/2018   Scalp laceration    Skull fracture ([place]) 07/13/2016   Chronic left shoulder pain 04/03/2016   DM2 (diabetes mellitus, type 2) ([place]) 06/27/2015   Hearing loss 06/21/2015   Advance care planning 12/17/2014   Palpitations 07/06/2014   Pain in joint, ankle and foot 04/20/2014   DVT, lower extremity ([place]) 11/19/2013   Edema 10/20/2013   Headache(784.0) 10/20/2013   Left leg swelling 10/20/2013   Aneurysm of basilar artery (HCC) 10/20/2013   Bradycardia 10/20/2013   Vitamin B12 deficiency 07/16/2013   Paresthesia of both feet 05/17/2013   TMJ arthritis 09/11/2012   Ear pain 09/11/2012   History of transfusion of packed red blood cells 09/11/2012   Spinal stenosis 04/13/2011   Well adult exam 01/23/2011   Cholecystitis with cholelithiasis, s/p lap chole 11/10/2010 12/01/2010   Shoulder pain, right 07/25/2010   TOBACCO USE, QUIT 03/09/2009   KNEE PAIN 05/18/2008   PN (peripheral neuropathy) 08/19/2007   LEG PAIN 08/19/2007   Vitamin D deficiency 02/17/2007   INSOMNIA, PERSISTENT 02/17/2007   HTN (hypertension) 02/17/2007   GERD 02/17/2007   Osteoarthritis 02/17/2007   LOW BACK PAIN 02/17/2007   Hyperglycemia 01/28/2007   UNSPECIFIED OSTEOPOROSIS 01/28/2007   PRECIPITOUS DROP IN HEMATOCRIT 01/28/2007  PCP:  Pcp, No Pharmacy:   [place], [place] - [address]ia [address] [phone] [place] [address] Phone: [phone] Fax: [phone]     Social Determinants of Health (SDOH) Interventions    Readmission Risk Interventions Readmission Risk Prevention Plan 03/30/2021 03/01/2021  Transportation Screening Complete Complete  PCP or Specialist  Appt within 3-5 Days - Complete  HRI or [place] - Complete  Social Work Consult for [place] Planning/Counseling - Complete  Palliative Care Screening - Not Applicable  Medication Review Press photographer) Complete Complete  PCP or Specialist appointment within 3-5 days of discharge Complete -  [place] or Home Care Consult Complete -  SW Recovery Care/Counseling Consult Complete -  Palliative Care Screening Not Applicable -  Skilled Nursing Facility Complete -  Some recent data might be hidden

## 2021-03-30 NOTE — Procedures (Signed)
History: 88 year old female being evaluated for altered mental status  Sedation: None  Technique: This EEG was acquired with electrodes placed according to the International 10-20 electrode system (including Fp1, Fp2, F3, F4, C3, C4, P3, P4, O1, O2, T3, T4, T5, T6, A1, A2, Fz, Cz, Pz). The following electrodes were missing or displaced: none.   Background: The background consists of intermixed alpha and beta activities. There is a well defined posterior dominant rhythm of 7-8 hz that attenuates with eye opening. Sleep is recorded with normal appearing structures.   Photic stimulation: Physiologic driving is not performed  EEG Abnormalities: 1) slow posterior dominant rhythm  Clinical Interpretation: This mildly abnormal EEG could be consistent with a mild encephalopathy, though a slow posterior dominant rhythm can be seen in the normal elderly as well. There was no seizure or seizure predisposition recorded on this study. Please note that lack of epileptiform activity on EEG does not preclude the possibility of epilepsy.   Roland Rack, MD [facility] [phone]  If 7pm- 7am, please page neurology on call as listed in [place].

## 2021-03-30 NOTE — Progress Notes (Signed)
PROGRESS NOTE    Kristina Wilkinson  DJT:[id number] DOB: 1927/04/15 DOA: 03/28/2021 PCP: Pcp, No     Brief Narrative:   Kristina Wilkinson is a 88 y.o. female with medical history significant of DM2, GERD, CNS aneurysm s/p clipping, DVT, HTN. Presenting with altered mental status.   Subjective: I have Reviewed nursing notes, Vitals, and Lab results since pt's last encounter. Pertinent lab results CBC, BMP,   She is confused, shaking and making noises this am,  Assessment & Plan:  Principal Problem:   Acute encephalopathy Active Problems:   HTN (hypertension)   DM2 (diabetes mellitus, type 2) (HCC)   Atrial fibrillation, chronic (HCC)   Dementia without behavioral disturbance (HCC)  Acute metabolic encephalopathy -CT head no acute findings -On antibiotic for presumed UTI -Get EEG to rule out seizure -Aspiration precaution, may need NG tube for meds and nutrition if encephalopathy does not improve -Start gentle hydration for now   Constipation     - BM regimen, PRN enema   Chronic a fib eliquis Hx of DVT     - continue home regimen  HTN     - resume home regimen     DM2     - diet controlled; DM diet after swallow screen. May need nutrition per tube if does not improve  Dementia     - continue home regimen when confirmed  FTT: Palliative care consulted for goals of care discussion  Nutritional Assessment: The patients BMI is: Body mass index is 20.64 kg/m.[place] Kitchen Seen by dietician.  I agree with the assessment and plan as outlined below: Nutrition Status:       Skin Assessment: I have examined the patients skin and I agree with the wound assessment as performed by the wound care RN as outlined below: Pressure Injury 03/07/19 Head Posterior Stage 2 -  Partial thickness loss of dermis presenting as a shallow open injury with a red, pink wound bed without slough. (Active)  03/07/19 1000  Location: Head  Location Orientation: Posterior  Staging: Stage 2 -  Partial  thickness loss of dermis presenting as a shallow open injury with a red, pink wound bed without slough.  Wound Description (Comments):   Present on Admission: Yes     Pressure Injury 02/28/21 Buttocks Left Stage 2 -  Partial thickness loss of dermis presenting as a shallow open injury with a red, pink wound bed without slough. (Active)  02/28/21 2145  Location: Buttocks  Location Orientation: Left  Staging: Stage 2 -  Partial thickness loss of dermis presenting as a shallow open injury with a red, pink wound bed without slough.  Wound Description (Comments):   Present on Admission: Yes     Pressure Injury 02/28/21 Heel Left Deep Tissue Pressure Injury - Purple or maroon localized area of discolored intact skin or blood-filled blister due to damage of underlying soft tissue from pressure and/or shear. (Active)  02/28/21 2145  Location: Heel  Location Orientation: Left  Staging: Deep Tissue Pressure Injury - Purple or maroon localized area of discolored intact skin or blood-filled blister due to damage of underlying soft tissue from pressure and/or shear.  Wound Description (Comments):   Present on Admission: Yes     Pressure Injury 02/28/21 Heel Right Deep Tissue Pressure Injury - Purple or maroon localized area of discolored intact skin or blood-filled blister due to damage of underlying soft tissue from pressure and/or shear. (Active)  02/28/21 2145  Location: Heel  Location Orientation: Right  Staging: Deep  Tissue Pressure Injury - Purple or maroon localized area of discolored intact skin or blood-filled blister due to damage of underlying soft tissue from pressure and/or shear.  Wound Description (Comments):   Present on Admission: Yes    I ordered the following labs:  Unresulted Labs (From admission, onward)    None        DVT prophylaxis: apixaban (ELIQUIS) tablet 2.5 mg Start: 03/30/21 2200 SCDs Start: 03/29/21 0313 apixaban (ELIQUIS) tablet 2.5 mg   Code Status:    Code Status: Partial Code  Family Communication: None at bedside Disposition:   Status is: Inpatient  Dispo: The patient is from: SNF              Anticipated d/c is to: TBD              Anticipated d/c date is: TBD  Antimicrobials:   Anti-infectives (From admission, onward)    Start     Dose/Rate Route Frequency Ordered Stop   03/29/21 1000  cefTRIAXone (ROCEPHIN) 1 g in sodium chloride 0.9 % 100 mL IVPB        1 g 200 mL/hr over 30 Minutes Intravenous Every 24 hours 03/29/21 0958             Objective: Vitals:   03/30/21 0634 03/30/21 0831 03/30/21 1034 03/30/21 1748  BP: (!) 166/65 (!) 170/49 (!) 143/46 (!) 154/77  Pulse: 89 92  72  Resp: 16 18  18   Temp: 98.4 F (36.9 C) 98.9 F (37.2 C)  97.8 F (36.6 C)  TempSrc: Oral Axillary  Oral  SpO2: 94%   99%  Weight:      Height:        Intake/Output Summary (Last 24 hours) at 03/30/2021 1912 Last data filed at 03/30/2021 1600 Gross per 24 hour  Intake 200 ml  Output --  Net 200 ml   Filed Weights   03/29/21 2300  Weight: 58 kg    Examination:  General exam: Very frail, not awake, she is shaky, flailing, making noises Respiratory system: Clear to auscultation. Respiratory effort normal. Cardiovascular system:  RRR.  Gastrointestinal system: Abdomen is nondistended, soft and nontender.  Normal bowel sounds heard. Central nervous system: Confused. Extremities:  no edema Skin: No rashes, lesions or ulcers Psychiatry: Confused.     Data Reviewed: I have personally reviewed  labs and visualized  imaging studies since the last encounter and formulate the plan        Scheduled Meds:  amLODipine  10 mg Oral Daily   apixaban  2.5 mg Oral BID   bisacodyl  10 mg Rectal Daily   busPIRone  5 mg Oral BID   lisinopril  40 mg Oral Daily   metoprolol succinate  12.5 mg Oral Daily   polyethylene glycol  17 g Oral BID   Continuous Infusions:  cefTRIAXone (ROCEPHIN)  IV 1 g (03/30/21 1028)     LOS: 1 day      Florencia Reasons, MD PhD FACP [facility]  Available via Epic secure chat 7am-7pm for nonurgent issues Please page for urgent issues To page the attending provider between 7A-7P or the covering provider during after hours 7P-7A, please log into the web site [email or web address] and access using universal [place] password for that web site. If you do not have the password, please call the hospital operator.    03/30/2021, 7:12 PM

## 2021-03-30 NOTE — Evaluation (Signed)
SLP Cancellation Note  Patient Details Name: Kristina Wilkinson MRN: [id number] DOB: 08-01-1927   Cancelled treatment:       Reason Eval/Treat Not Completed: Other (comment);Fatigue/lethargy limiting ability to participate (pt sound asleep, rn reports pt receieved ativan this am, requested RN reach out to SLP if pt will awaken adequately for po or eval)   [name] 03/30/2021, 11:03 AM

## 2021-03-31 DIAGNOSIS — Z515 Encounter for palliative care: Secondary | ICD-10-CM

## 2021-03-31 DIAGNOSIS — F0390 Unspecified dementia without behavioral disturbance: Secondary | ICD-10-CM

## 2021-03-31 LAB — BASIC METABOLIC PANEL
Anion gap: 7 (ref 5–15)
BUN: 42 mg/dL — ABNORMAL HIGH (ref 8–23)
CO2: 23 mmol/L (ref 22–32)
Calcium: 9.8 mg/dL (ref 8.9–10.3)
Chloride: 117 mmol/L — ABNORMAL HIGH (ref 98–111)
Creatinine, Ser: 0.7 mg/dL (ref 0.44–1.00)
GFR, Estimated: >60 mL/min (ref >60)
Glucose, Bld: 147 mg/dL — ABNORMAL HIGH (ref 70–99)
Potassium: 3.6 mmol/L (ref 3.5–5.1)
Sodium: 147 mmol/L — ABNORMAL HIGH (ref 135–145)

## 2021-03-31 LAB — PHOSPHORUS: Phosphorus: 3 mg/dL (ref 2.5–4.6)

## 2021-03-31 MED ORDER — DEXTROSE IN LACTATED RINGERS 5 % IV SOLN: INTRAVENOUS | Status: AC

## 2021-03-31 NOTE — Consult Note (Signed)
Consultation Note Date: 03/31/2021   Patient Name: Kristina Wilkinson  DOB: Jan 21, 1928  MRN: [id number]  Age / Sex: 88 y.o., female  PCP: Pcp, No Referring Physician: Florencia Reasons, MD  Reason for Consultation: Establishing goals of care  HPI/Patient Profile: 88 y.o. female   admitted on 03/28/2021   Clinical Assessment and Goals of Care:  Kristina Wilkinson is a 88 y.o. female with medical history significant of DM2, GERD, CNS aneurysm s/p clipping, DVT, HTN. Presenting with altered mental status.  Patient undergoing treatment for UTI and also had EEG done.  A palliative consult has been requested for goals of care discussions.  Patient is awake alert resting in bed, she is hard of hearing, answers few questions. No family at bedside.  Palliative medicine is specialized medical care for people living with serious illness. It focuses on providing relief from the symptoms and stress of a serious illness. The goal is to improve quality of life for both the patient and the family. Goals of care: Broad aims of medical therapy in relation to the patient's values and preferences. Our aim is to provide medical care aimed at enabling patients to achieve the goals that matter most to them, given the circumstances of their particular medical situation and their constraints.    HCPOA    SUMMARY OF RECOMMENDATIONS   Call placed and discussed with patient's healthcare power of attorney her cousin Mr. Loletha Grayer on the phone.  Goals wishes and values attempted to be explored.  He asked about the nature of her hospitalization.  Discussed with him to the best of my ability.  Remains admitted to hospital medicine service undergoing treatment for possible urine tract infection.  Relayed to him EEG results. Mr. Loletha Grayer has previously filled out a MOST form.  We reviewed CODE STATUS and broad goals of care and healthcare related wishes: Mr. Loletha Grayer  endorses partial code.  He wants the patient to undergo an attempt at CPR but does not want the patient to be started on machines.  He endorses DO NOT INTUBATE.  He does not want tubes and machines for the patient at end-of-life however he believes that some attempt at CPR is justified " to see if it will bring her back."  We will go ahead and avoid the MOST form present on the chart. Continue current mode of care, recommend patient being transferred back to [facility] facility on discharge, recommend addition of palliative services at her facility upon discharge. Thank you for the consult.   Code Status/Advance Care Planning: Limited code   Symptom Management:     Palliative Prophylaxis:  Delirium Protocol    Psycho-social/Spiritual:  Desire for further Chaplaincy support:yes Additional Recommendations: Caregiving  Support/Resources  Prognosis:  Unable to determine  Discharge Planning:  back to facility with palliative.        Primary Diagnoses: Present on Admission:  Acute encephalopathy  HTN (hypertension)   I have reviewed the medical record, interviewed the patient and family, and examined the patient. The following aspects are  pertinent.  Past Medical History:  Diagnosis Date   Arthritis    CNS aneurysm April 30, 1981   s/p clipping   Diabetes mellitus type II    diet controlled    DVT (deep venous thrombosis) (HCC) 10/15   Elevated glucose    Hypertension    Insomnia    Knee pain    Left Dr. Meyer Cory   LBP (low back pain)    PN (peripheral neuropathy)    unknown etiol   Social History   Socioeconomic History   Marital status: Widowed    Spouse name: Not on file   Number of children: 1   Years of education: 38   Highest education level: 12th grade  Occupational History   Occupation: retired    Fish farm manager: RETIRED  Tobacco Use   Smoking status: Former   Smokeless tobacco: Never   Tobacco comments:    quit in [place] Use   Vaping Use: Never used   Substance and Sexual Activity   Alcohol use: No    Alcohol/week: 0.0 standard drinks   Drug use: No   Sexual activity: Not Currently  Other Topics Concern   Not on file  Social History Narrative   Lives alone in a [place] home.  Husband passed away in 05-01-1999.   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   1 son, not local.     Enjoys shopping   Still drives, uses a cane.     Social Determinants of Health   Financial Resource Strain: Not on file  Food Insecurity: Not on file  Transportation Needs: Not on file  Physical Activity: Not on file  Stress: Not on file  Social Connections: Not on file   Family History  Problem Relation Age of Onset   Arthritis Mother    Stroke Mother    Stroke Father    Arthritis-Osteo Son    Scheduled Meds:  amLODipine  10 mg Oral Daily   apixaban  2.5 mg Oral BID   bisacodyl  10 mg Rectal Daily   busPIRone  5 mg Oral BID   lisinopril  40 mg Oral Daily   metoprolol succinate  12.5 mg Oral Daily   polyethylene glycol  17 g Oral BID   Continuous Infusions:  cefTRIAXone (ROCEPHIN)  IV 1 g (03/31/21 0939)   dextrose 5% lactated ringers 75 mL/hr at 03/31/21 1026   PRN Meds:.acetaminophen **OR** acetaminophen, labetalol, ondansetron **OR** ondansetron (ZOFRAN) IV Medications Prior to Admission:  Prior to Admission medications   Medication Sig Start Date End Date Taking? Authorizing Provider  amLODipine (NORVASC) 10 MG tablet Take 1 tablet (10 mg total) by mouth daily. 10/10/20  Yes Sheikh, Omair Latif, DO  apixaban (ELIQUIS) 5 MG TABS tablet Take 1 tablet (5 mg total) by mouth 2 (two) times daily. 10/10/20  Yes Sheikh, Omair Latif, DO  busPIRone (BUSPAR) 5 MG tablet Take 5 mg by mouth in the morning and at bedtime.   Yes [provider]  cefTRIAXone (ROCEPHIN) 1 g injection Inject 1 g into the muscle daily. 03/28/21  Yes [provider]  lisinopril (ZESTRIL) 40 MG tablet Take 1 tablet (40 mg total) by mouth daily. 10/10/20   Yes Sheikh, Omair Latif, DO  Lorazepam POWD Apply 1 mg topically See admin instructions. Apply 1 mg of gel (after reconstituted) onto the skin three times a day as needed for anxiety or agitation   Yes [provider]  metoprolol succinate (TOPROL-XL) 25 MG 24 hr tablet  Take 0.5 tablets (12.5 mg total) by mouth daily. 10/10/20  Yes Sheikh, Omair Latif, DO  polyethylene glycol (MIRALAX / GLYCOLAX) 17 g packet Take 17 g by mouth 2 (two) times daily. 10/10/20  Yes Sheikh, Omair Latif, DO  potassium chloride SA (KLOR-CON M) 20 MEQ tablet Take 40 mEq by mouth daily. 02/20/21  Yes [provider]  senna (SENOKOT) 8.6 MG TABS tablet Take 8.6 mg by mouth in the morning and at bedtime.   Yes [provider]  UNABLE TO FIND Take 90 mLs by mouth 2 (two) times daily. Med Name: Medpass 2.0   Yes [provider]  acetaminophen (TYLENOL) 325 MG tablet Take 2 tablets (650 mg total) by mouth every 6 (six) hours as needed for mild pain (or Fever >/= 101). Patient not taking: Reported on 03/29/2021 10/10/20   Raiford Noble Latif, DO  albuterol (PROVENTIL) (2.5 MG/3ML) 0.083% nebulizer solution Take 3 mLs (2.5 mg total) by nebulization every 4 (four) hours as needed for wheezing or shortness of breath. Patient not taking: Reported on 03/29/2021 10/10/20   Raiford Noble Latif, DO  bisacodyl (DULCOLAX) 10 MG suppository Place 1 suppository (10 mg total) rectally daily as needed for moderate constipation. Patient not taking: Reported on 03/29/2021 10/10/20   Raiford Noble Latif, DO  polyvinyl alcohol (LIQUIFILM TEARS) 1.4 % ophthalmic solution Place 1 drop into both eyes as needed for dry eyes. Patient not taking: Reported on 03/29/2021 10/10/20   Raiford Noble Latif, DO  ZOFRAN 4 MG tablet Take 4 mg by mouth every 6 (six) hours as needed for nausea. Patient not taking: Reported on 03/29/2021    [provider]   Allergies  Allergen Reactions   Gabapentin Itching and Other (See Comments)     Made the legs "go numb" and "allergic," per [facility]   Other Other (See Comments)    "MD mixed a blood pressure medication with the Clinoril I was taking and I couldn't walk" "made me achy and sick"   Penicillins Other (See Comments) and Itching    Has patient had a PCN reaction causing immediate rash, facial/tongue/throat swelling, SOB or lightheadedness with hypotension: Unk- exact reaction not recalled, but is allergic Has patient had a PCN reaction causing severe rash involving mucus membranes or skin necrosis: Unk Has patient had a PCN reaction that required hospitalization: Unk Has patient had a PCN reaction occurring within the last 10 years: Yes If all of the above answers are "NO", then may proceed with Cephalosporin use.  Itching and rash   Pregabalin Other (See Comments)    "Made me pass out" and "made the limbs go numb"- "allergic," per [facility]   Vit D-Vit E-Safflower Oil Other (See Comments)    "Made me achy and sick"   Review of Systems Hard of hearing  Physical Exam Awake alert Sitting up in bed Appears pleasantly confused Regular work of breathing S 1 S 2  Abdomen not tender No edema  Vital Signs: BP (!) 145/117    Pulse 70    Temp (!) 97.5 F (36.4 C) (Oral)    Resp 16    Ht 5\' 6"  (1.676 m)    Wt 62.6 kg    SpO2 98%    BMI 22.28 kg/m  Pain Scale: 0-10   Pain Score: 0-No pain   SpO2: SpO2: 98 % O2 Device:SpO2: 98 % O2 Flow Rate: .   IO: Intake/output summary:  Intake/Output Summary (Last 24 hours) at 03/31/2021 1352 Last data filed at  03/31/2021 1119 Gross per 24 hour  Intake 652.39 ml  Output --  Net 652.39 ml    LBM: Last BM Date : 03/31/21 Baseline Weight: Weight: 58 kg Most recent weight: Weight: 62.6 kg     Palliative Assessment/Data:   PPS 50%  Time In:  1300 Time Out:  1400 Time Total:  60  Greater than 50%  of this time was spent counseling and coordinating care related to the above assessment and plan.  Signed by: Loistine Chance, MD    Please contact Palliative Medicine Team phone at [phone] for questions and concerns.  For individual provider: See Shea Evans

## 2021-03-31 NOTE — Evaluation (Signed)
Clinical/Bedside Swallow Evaluation Patient Details  Name: Kristina Wilkinson MRN: [id number] Date of Birth: 08-10-1927  Today's Date: 03/31/2021 Time: SLP Start Time (ACUTE ONLY): 1003 SLP Stop Time (ACUTE ONLY): 1020 SLP Time Calculation (min) (ACUTE ONLY): 17 min  Past Medical History:  Past Medical History:  Diagnosis Date   Arthritis    CNS aneurysm 1983   s/p clipping   Diabetes mellitus type II    diet controlled    DVT (deep venous thrombosis) (HCC) 10/15   Elevated glucose    Hypertension    Insomnia    Knee pain    Left Dr. Meyer Cory   LBP (low back pain)    PN (peripheral neuropathy)    unknown etiol   Past Surgical History:  Past Surgical History:  Procedure Laterality Date   CEREBRAL ANEURYSM REPAIR     initial repair in the 1980s, with second surgery in the [place]  04/2009   Left   TOTAL KNEE ARTHROPLASTY     TUBAL LIGATION     HPI:  Pt is a 88 yo female presenting from SNF with AMS. CTH negative for acute findings but did show old bilateral cerebellar infarcts; additional w/u pending. CXR on admission without active disease. PMH includes: DM2, GERD, CNS aneurysm s/p clipping, DVT, HTN    Assessment / Plan / Recommendation  Clinical Impression  Pt was finishing breakfast upon SLP arrival, but was noted to be partially reclined and leaning toward her L in bed. RN reported that she had observed some mild coughing with thin liquids this am, but after SLP assisted with repositioning, there were no overt s/s of aspiration with any trials observed. She does have audible swallows but no other significant signs of dysphagia. Recommend continuing current diet but with set-up assistance (and intermittent assist during meal) to facilitate safer positioning. Given coughing noted earlier today, would also administer pills whole in puree. SLP will also f/u at least briefly. SLP Visit Diagnosis: Dysphagia, unspecified  (R13.10)    Aspiration Risk  Mild aspiration risk    Diet Recommendation Regular;Thin liquid   Liquid Administration via: Cup;Straw Medication Administration: Whole meds with puree Supervision: Patient able to self feed;Intermittent supervision to cue for compensatory strategies;Comment (set-up assist) Compensations: Minimize environmental distractions;Slow rate;Small sips/bites Postural Changes: Seated upright at 90 degrees    Other  Recommendations Oral Care Recommendations: Oral care BID    Recommendations for follow up therapy are one component of a multi-disciplinary discharge planning process, led by the attending physician.  Recommendations may be updated based on patient status, additional functional criteria and insurance authorization.  Follow up Recommendations Skilled nursing-short term rehab (<3 hours/day)      Assistance Recommended at Discharge Frequent or constant Supervision/Assistance  Functional Status Assessment Patient has not had a recent decline in their functional status  Frequency and Duration min 2x/week  1 week       Prognosis Prognosis for Safe Diet Advancement: Good      Swallow Study   General HPI: Pt is a 88 yo female presenting from SNF with AMS. CTH negative for acute findings but did show old bilateral cerebellar infarcts; additional w/u pending. CXR on admission without active disease. PMH includes: DM2, GERD, CNS aneurysm s/p clipping, DVT, HTN Type of Study: Bedside Swallow Evaluation Previous Swallow Assessment: none in chart Diet Prior to this Study: Regular;Thin liquids Temperature Spikes Noted: No Respiratory Status: Room air  History of Recent Intubation: No Behavior/Cognition: Alert;Cooperative;Pleasant mood;Requires cueing;Other (Comment) (HOH) Oral Cavity Assessment: Within Functional Limits Oral Care Completed by SLP: No Oral Cavity - Dentition: Adequate natural dentition Vision: Functional for self-feeding Self-Feeding  Abilities: Able to feed self;Needs set up Patient Positioning: Upright in bed Baseline Vocal Quality: Normal Volitional Cough: Weak Volitional Swallow: Able to elicit    Oral/Motor/Sensory Function Overall Oral Motor/Sensory Function: Within functional limits (did not follow all commands (HOH, SLP did write instructions which helped) but [facility] for those that she did follow)   Ice Chips Ice chips: Not tested   Thin Liquid Thin Liquid: Within functional limits Presentation: Self Fed;Straw    Nectar Thick Nectar Thick Liquid: Not tested   Honey Thick Honey Thick Liquid: Not tested   Puree Puree: Within functional limits Presentation: Self Fed;Spoon   Solid     Solid: Within functional limits Presentation: Self Fed      Osie Bond., M.A. [place] Pager [phone] Office [phone]  03/31/2021,10:27 AM

## 2021-03-31 NOTE — Progress Notes (Signed)
PROGRESS NOTE    Kristina Wilkinson  JJO:[id number] DOB: [birth year] DOA: 03/28/2021 PCP: Pcp, No     Brief Narrative:   Kristina Wilkinson is a 88 y.o. female with medical history significant of DM2, GERD, CNS aneurysm s/p clipping, DVT, HTN. Presenting with altered mental status.   Subjective:  She is doing better, pleasantly confused, only oriented to self but calm and following commands She denies pain  Assessment & Plan:  Principal Problem:   Acute encephalopathy Active Problems:   HTN (hypertension)   DM2 (diabetes mellitus, type 2) (HCC)   Atrial fibrillation, chronic (HCC)   Dementia without behavioral disturbance (HCC)  Acute metabolic encephalopathy -CT head no acute findings -On antibiotic for presumed UTI - EEG did not show seizure spikes -Appear improving today, continue IV antibiotics and IV fluids today, follow-up on culture result  Hypernatremia/elevated BUN Likely due to poor oral intake/dehydration, continue hydration, repeat BMP in the morning  Constipation     -Had BM , continue bowel regimen    Chronic a fib eliquis Hx of DVT     - continue home regimen  HTN     - resume home regimen     DM2     - diet controlled; DM diet after swallow screen. May need nutrition per tube if does not improve  Dementia     - continue home regimen when confirmed  FTT: Palliative care consulted for goals of care discussion  Nutritional Assessment: The patients BMI is: Body mass index is 22.28 kg/m.Marland Kitchen Seen by dietician.  I agree with the assessment and plan as outlined below: Nutrition Status:       Skin Assessment: I have examined the patients skin and I agree with the wound assessment as performed by the wound care RN as outlined below: Pressure Injury 03/07/19 Head Posterior Stage 2 -  Partial thickness loss of dermis presenting as a shallow open injury with a red, pink wound bed without slough. (Active)  03/07/19 1000  Location: Head  Location Orientation:  Posterior  Staging: Stage 2 -  Partial thickness loss of dermis presenting as a shallow open injury with a red, pink wound bed without slough.  Wound Description (Comments):   Present on Admission: Yes     Pressure Injury 02/28/21 Buttocks Left Stage 2 -  Partial thickness loss of dermis presenting as a shallow open injury with a red, pink wound bed without slough. (Active)  02/28/21 2145  Location: Buttocks  Location Orientation: Left  Staging: Stage 2 -  Partial thickness loss of dermis presenting as a shallow open injury with a red, pink wound bed without slough.  Wound Description (Comments):   Present on Admission: Yes     Pressure Injury 02/28/21 Heel Left Deep Tissue Pressure Injury - Purple or maroon localized area of discolored intact skin or blood-filled blister due to damage of underlying soft tissue from pressure and/or shear. (Active)  02/28/21 2145  Location: Heel  Location Orientation: Left  Staging: Deep Tissue Pressure Injury - Purple or maroon localized area of discolored intact skin or blood-filled blister due to damage of underlying soft tissue from pressure and/or shear.  Wound Description (Comments):   Present on Admission: Yes     Pressure Injury 02/28/21 Heel Right Deep Tissue Pressure Injury - Purple or maroon localized area of discolored intact skin or blood-filled blister due to damage of underlying soft tissue from pressure and/or shear. (Active)  02/28/21 2145  Location: Heel  Location Orientation: Right  Staging: Deep Tissue Pressure Injury - Purple or maroon localized area of discolored intact skin or blood-filled blister due to damage of underlying soft tissue from pressure and/or shear.  Wound Description (Comments):   Present on Admission: Yes    I have Reviewed nursing notes, Vitals, and Lab results since pt's last encounter. Pertinent lab results CBC, BMP, detail please see discussion above  I ordered the following labs:  Unresulted Labs (From  admission, onward)     Start     Ordered   03/31/21 1941  Basic metabolic panel  Daily,   R      03/30/21 1918             DVT prophylaxis: apixaban (ELIQUIS) tablet 2.5 mg Start: 03/30/21 2200 SCDs Start: 03/29/21 0313 apixaban (ELIQUIS) tablet 2.5 mg   Code Status:   Code Status: Partial Code  Family Communication: None at bedside Disposition:   Status is: Inpatient  Dispo: The patient is from: SNF              Anticipated d/c is to: TBD              Anticipated d/c date is: TBD  Antimicrobials:   Anti-infectives (From admission, onward)    Start     Dose/Rate Route Frequency Ordered Stop   03/29/21 1000  cefTRIAXone (ROCEPHIN) 1 g in sodium chloride 0.9 % 100 mL IVPB        1 g 200 mL/hr over 30 Minutes Intravenous Every 24 hours 03/29/21 0958 04/02/21 0959           Objective: Vitals:   03/31/21 0432 03/31/21 0449 03/31/21 0932 03/31/21 1357  BP:  (!) 165/71 (!) 145/117 (!) 110/53  Pulse:  78 70 89  Resp:  16  14  Temp:  (!) 97.5 F (36.4 C)    TempSrc:  Oral    SpO2:  98%  98%  Weight: 62.6 kg     Height:        Intake/Output Summary (Last 24 hours) at 03/31/2021 1853 Last data filed at 03/31/2021 1409 Gross per 24 hour  Intake 512.39 ml  Output --  Net 512.39 ml   Filed Weights   03/29/21 2300 03/31/21 0432  Weight: 58 kg 62.6 kg    Examination:  General exam: Still slightly lethargic, but pleasant, follow commands, only oriented to self Respiratory system: Clear to auscultation. Respiratory effort normal. Cardiovascular system:  RRR.  Gastrointestinal system: Abdomen is nondistended, soft and nontender.  Normal bowel sounds heard. Central nervous system: Pleasantly confused. Extremities:  no edema Skin: No rashes, lesions or ulcers Psychiatry: Confused.  No agitation    Data Reviewed: I have personally reviewed  labs and visualized  imaging studies since the last encounter and formulate the plan        Scheduled Meds:   amLODipine  10 mg Oral Daily   apixaban  2.5 mg Oral BID   bisacodyl  10 mg Rectal Daily   busPIRone  5 mg Oral BID   lisinopril  40 mg Oral Daily   metoprolol succinate  12.5 mg Oral Daily   polyethylene glycol  17 g Oral BID   Continuous Infusions:  cefTRIAXone (ROCEPHIN)  IV 1 g (03/31/21 0939)   dextrose 5% lactated ringers 75 mL/hr at 03/31/21 1026     LOS: 2 days     Florencia Reasons, MD PhD FACP [facility]  Available via Epic secure chat 7am-7pm for nonurgent issues Please page for  urgent issues To page the attending provider between 7A-7P or the covering provider during after hours 7P-7A, please log into the web site [email or web address] and access using universal [place] password for that web site. If you do not have the password, please call the hospital operator.    03/31/2021, 6:53 PM

## 2021-04-01 LAB — BASIC METABOLIC PANEL
Anion gap: 6 (ref 5–15)
BUN: 29 mg/dL — ABNORMAL HIGH (ref 8–23)
CO2: 23 mmol/L (ref 22–32)
Calcium: 9.1 mg/dL (ref 8.9–10.3)
Chloride: 113 mmol/L — ABNORMAL HIGH (ref 98–111)
Creatinine, Ser: 0.55 mg/dL (ref 0.44–1.00)
GFR, Estimated: >60 mL/min (ref >60)
Glucose, Bld: 179 mg/dL — ABNORMAL HIGH (ref 70–99)
Potassium: 3 mmol/L — ABNORMAL LOW (ref 3.5–5.1)
Sodium: 142 mmol/L (ref 135–145)

## 2021-04-01 LAB — RESP PANEL BY RT-PCR (FLU A&B, COVID) ARPGX2
Influenza A by PCR: NEGATIVE
Influenza B by PCR: NEGATIVE
SARS Coronavirus 2 by RT PCR: NEGATIVE

## 2021-04-01 MED ORDER — POTASSIUM CHLORIDE 20 MEQ PO PACK
40.0000 meq | PACK | Freq: Once | ORAL | Status: AC
Start: 2021-04-01 — End: 2021-04-01
  Administered 2021-04-01: 40 meq via ORAL
  Filled 2021-04-01: qty 2

## 2021-04-01 NOTE — Discharge Summary (Signed)
Discharge Summary  Kristina Wilkinson YPP:[id number] DOB: Apr 17, 1927  PCP: Pcp, No  Admit date: 03/28/2021 Discharge date: 04/01/2021  Time spent: 69mins, more than 50% time spent on coordination of care.  Partial code  ( DNI)  Recommendations for Outpatient Follow-up:  F/u with SNF  for hospital discharge follow up, repeat cbc/bmp at follow up F/u with ortho Dr Sharol Given for "Subcutaneous gas in the right posterior tissues superficial to the gluteal muscles"  incidental finding on CT ab, unknown significance  Palliative care to follow patient at snf for continued goals of care discussion    Discharge Diagnoses:  Active Hospital Problems   Diagnosis Date Noted   Acute encephalopathy 03/29/2021   Dementia without behavioral disturbance ([place]) 02/28/2021   Atrial fibrillation, chronic ([place]) 09/25/2020   DM2 (diabetes mellitus, type 2) ([place]) 06/27/2015   HTN (hypertension) 02/17/2007    Resolved Hospital Problems  No resolved problems to display.    Discharge Condition: stable  Diet recommendation: carb modified  Filed Weights   03/29/21 2300 03/31/21 0432 04/01/21 0539  Weight: 58 kg 62.6 kg 65.2 kg    History of present illness: ( per admitting provider Dr Marylyn Ishihara) Kristina Wilkinson is a 88 y.o. female with medical history significant of DM2, GERD, CNS aneurysm s/p clipping, DVT, HTN. Presenting with altered mental status. History is from chart refuse as patient is confused and not cooperating with interview. She was sent from [facility] for concern of altered mental status and right hip fracture. They are not aware of any falls at the facility, but she appears to have a deformity of the right hip. They note that she has been more confused recently. She is less talkative and interactive.   ED w/u revealed stable vitals, a mildly elevated white count, and decreased bicarb. [facility] was called for admission.    Hospital Course:  Principal Problem:   Acute encephalopathy Active Problems:   HTN  (hypertension)   DM2 (diabetes mellitus, type 2) (HCC)   Atrial fibrillation, chronic (HCC)   Dementia without behavioral disturbance (HCC)   Assessment and Plan: No notes have been filed under this hospital service. Service: Hospitalist  Acute metabolic encephalopathy, from dehydration? -CT head no acute findings -On antibiotic for presumed UTI, ua negative for nitrite, + leuk , + ketone, urine specific gravity elevated at 1.039 , urine culture multiple species,blood culture no growth - EEG did not show seizure spikes -improved, interactive, following command -d/c to SNF/long term care    Hypernatremia/elevated BUN Likely due to poor oral intake/dehydration, Improved after iv hydration Encourage oral intake, hydration, nutrition supplement   Constipation -ct ab on presentation showed increased stool /Large stool ball in the rectum.     -Had BM , continue bowel regimen   Subcutaneous gas in the right posterior tissues superficial to the gluteal muscles Incidental finding on CT ab of unknown significance, she does not have decubitus ulcer on right side, case discussed with ortho Dr Sharol Given who recommend outpatient follow up     Chronic a fib eliquis Hx of DVT     - continue home regimen   HTN     - bp low normal initially , likely due to dehydration , improved  - recommend continue hold norvasc/linisopirl -continue betablocker  -pcp to close follow up  for further blood pressure medication titration     DM2     - diet controlled; stable   Dementia     - continue home regimen when confirmed, pleasantly confused  at discharge   FTT: Palliative care consulted for goals of care discussion, code status changed to partial code (DNI ), detail please see palliative care note Recommend palliative care continue to follow patient at long term care    Skin Assessment: I have examined the patients skin and I agree with the wound assessment as performed by the wound care RN as  outlined below: Pressure Injury 03/07/19 Head Posterior Stage 2 -  Partial thickness loss of dermis presenting as a shallow open injury with a red, pink wound bed without slough. (Active)  03/07/19 1000  Location: Head  Location Orientation: Posterior  Staging: Stage 2 -  Partial thickness loss of dermis presenting as a shallow open injury with a red, pink wound bed without slough.  Wound Description (Comments):   Present on Admission: Yes     Pressure Injury 02/28/21 Buttocks Left Stage 2 -  Partial thickness loss of dermis presenting as a shallow open injury with a red, pink wound bed without slough. (Active)  02/28/21 2145  Location: Buttocks  Location Orientation: Left  Staging: Stage 2 -  Partial thickness loss of dermis presenting as a shallow open injury with a red, pink wound bed without slough.  Wound Description (Comments):   Present on Admission: Yes     Pressure Injury 02/28/21 Heel Left Deep Tissue Pressure Injury - Purple or maroon localized area of discolored intact skin or blood-filled blister due to damage of underlying soft tissue from pressure and/or shear. (Active)  02/28/21 2145  Location: Heel  Location Orientation: Left  Staging: Deep Tissue Pressure Injury - Purple or maroon localized area of discolored intact skin or blood-filled blister due to damage of underlying soft tissue from pressure and/or shear.  Wound Description (Comments):   Present on Admission: Yes     Pressure Injury 02/28/21 Heel Right Deep Tissue Pressure Injury - Purple or maroon localized area of discolored intact skin or blood-filled blister due to damage of underlying soft tissue from pressure and/or shear. (Active)  02/28/21 2145  Location: Heel  Location Orientation: Right  Staging: Deep Tissue Pressure Injury - Purple or maroon localized area of discolored intact skin or blood-filled blister due to damage of underlying soft tissue from pressure and/or shear.  Wound Description (Comments):    Present on Admission: Yes      Consultations: Palliative care Ortho Dr Sharol Given  Discharge Exam: BP (!) 128/48 (BP Location: Right Arm)    Pulse (!) 57    Temp 98.4 F (36.9 C) (Oral)    Resp 14    Ht 5\' 6"  (1.676 m)    Wt 65.2 kg    SpO2 99%    BMI 23.20 kg/m   General: alert, interactive, pleasantly confused  Cardiovascular: RRR Respiratory: normal respiratory effort     Discharge Instructions     Diet Carb Modified   Complete by: As directed    Increase activity slowly   Complete by: As directed       Allergies as of 04/01/2021       Reactions   Gabapentin Itching, Other (See Comments)   Made the legs "go numb" and "allergic," per [facility]   Other Other (See Comments)   "MD mixed a blood pressure medication with the Clinoril I was taking and I couldn't walk" "made me achy and sick"   Penicillins Other (See Comments), Itching   Has patient had a PCN reaction causing immediate rash, facial/tongue/throat swelling, SOB or lightheadedness with hypotension: Unk- exact reaction not  recalled, but is allergic Has patient had a PCN reaction causing severe rash involving mucus membranes or skin necrosis: Unk Has patient had a PCN reaction that required hospitalization: Unk Has patient had a PCN reaction occurring within the last 10 years: Yes If all of the above answers are "NO", then may proceed with Cephalosporin use. Itching and rash   Pregabalin Other (See Comments)   "Made me pass out" and "made the limbs go numb"- "allergic," per [facility]   Vit D-vit E-safflower Oil Other (See Comments)   "Made me achy and sick"        Medication List     STOP taking these medications    albuterol (2.5 MG/3ML) 0.083% nebulizer solution Commonly known as: PROVENTIL   amLODipine 10 MG tablet Commonly known as: NORVASC   cefTRIAXone 1 g injection Commonly known as: ROCEPHIN   lisinopril 40 MG tablet Commonly known as: ZESTRIL   Lorazepam Powd   Zofran 4 MG tablet Generic drug:  ondansetron       TAKE these medications    acetaminophen 325 MG tablet Commonly known as: TYLENOL Take 2 tablets (650 mg total) by mouth every 6 (six) hours as needed for mild pain (or Fever >/= 101).   apixaban 5 MG Tabs tablet Commonly known as: ELIQUIS Take 1 tablet (5 mg total) by mouth 2 (two) times daily.   bisacodyl 10 MG suppository Commonly known as: DULCOLAX Place 1 suppository (10 mg total) rectally daily as needed for moderate constipation.   busPIRone 5 MG tablet Commonly known as: BUSPAR Take 5 mg by mouth in the morning and at bedtime.   metoprolol succinate 25 MG 24 hr tablet Commonly known as: TOPROL-XL Take 0.5 tablets (12.5 mg total) by mouth daily.   polyethylene glycol 17 g packet Commonly known as: MIRALAX / GLYCOLAX Take 17 g by mouth 2 (two) times daily.   polyvinyl alcohol 1.4 % ophthalmic solution Commonly known as: LIQUIFILM TEARS Place 1 drop into both eyes as needed for dry eyes.   potassium chloride SA 20 MEQ tablet Commonly known as: KLOR-CON M Take 40 mEq by mouth daily.   senna 8.6 MG Tabs tablet Commonly known as: SENOKOT Take 8.6 mg by mouth in the morning and at bedtime.   UNABLE TO FIND Take 90 mLs by mouth 2 (two) times daily. Med Name: Medpass 2.0       Allergies  Allergen Reactions   Gabapentin Itching and Other (See Comments)    Made the legs "go numb" and "allergic," per [facility]   Other Other (See Comments)    "MD mixed a blood pressure medication with the Clinoril I was taking and I couldn't walk" "made me achy and sick"   Penicillins Other (See Comments) and Itching    Has patient had a PCN reaction causing immediate rash, facial/tongue/throat swelling, SOB or lightheadedness with hypotension: Unk- exact reaction not recalled, but is allergic Has patient had a PCN reaction causing severe rash involving mucus membranes or skin necrosis: Unk Has patient had a PCN reaction that required hospitalization: Unk Has patient  had a PCN reaction occurring within the last 10 years: Yes If all of the above answers are "NO", then may proceed with Cephalosporin use.  Itching and rash   Pregabalin Other (See Comments)    "Made me pass out" and "made the limbs go numb"- "allergic," per [facility]   Vit D-Vit E-Safflower Oil Other (See Comments)    "Made me achy and sick"  Follow-up Information     Newt Minion, MD Follow up in 2 week(s).   Specialty: Orthopedic Surgery Why: right hip pain Contact information: [address] [place] [address] [phone]                  The results of significant diagnostics from this hospitalization (including imaging, microbiology, ancillary and laboratory) are listed below for reference.    Significant Diagnostic Studies: DG Chest 1 View  Result Date: 03/28/2021 CLINICAL DATA:  Fall EXAM: CHEST  1 VIEW COMPARISON:  Chest x-ray 03/28/2021 1:31 a.m., Chest x-ray 11/30/2018 FINDINGS: The heart and mediastinal contours are unchanged. Aortic calcification. Slightly prominent hilar vasculature likely due to low lung volumes. Low lung volumes. No focal consolidation. Chronic coarsened interstitial markings. No pleural effusion. No pneumothorax. No acute osseous abnormality. IMPRESSION: Low lung volumes with no active disease. Electronically Signed   By: Iven Finn M.D.   On: 03/28/2021 23:46   DG Chest 1 View  Result Date: 03/28/2021 CLINICAL DATA:  Altered mental status. EXAM: CHEST  1 VIEW COMPARISON:  Chest radiograph dated 02/28/2021. FINDINGS: No focal consolidation, pleural effusion, pneumothorax. The cardiac silhouette is within normal limits. Atherosclerotic calcification of the aorta. No acute osseous pathology. Degenerative changes of the spine. IMPRESSION: No active cardiopulmonary disease. Electronically Signed   By: Anner Crete M.D.   On: 03/28/2021 01:36   CT Head Wo Contrast  Result Date: 03/28/2021 CLINICAL DATA:  Head trauma, moderate-severe  EXAM: CT HEAD WITHOUT CONTRAST TECHNIQUE: Contiguous axial images were obtained from the base of the skull through the vertex without intravenous contrast. RADIATION DOSE REDUCTION: This exam was performed according to the departmental dose-optimization program which includes automated exposure control, adjustment of the mA and/or kV according to patient size and/or use of iterative reconstruction technique. COMPARISON:  03/27/2021 FINDINGS: Brain: Old bilateral cerebellar infarcts, stable. There is atrophy and chronic small vessel disease changes. No acute intracranial abnormality. Specifically, no hemorrhage, hydrocephalus, mass lesion, acute infarction, or significant intracranial injury. Vascular: No hyperdense vessel or unexpected calcification. Skull: No acute calvarial abnormality. Sinuses/Orbits: No acute findings Other: None IMPRESSION: Old bilateral cerebellar infarcts. Atrophy, chronic microvascular disease. No acute intracranial abnormality. Electronically Signed   By: Rolm Baptise M.D.   On: 03/28/2021 23:04   CT Head Wo Contrast  Result Date: 03/27/2021 CLINICAL DATA:  Mental status change, unknown cause. EXAM: CT HEAD WITHOUT CONTRAST TECHNIQUE: Contiguous axial images were obtained from the base of the skull through the vertex without intravenous contrast. RADIATION DOSE REDUCTION: This exam was performed according to the departmental dose-optimization program which includes automated exposure control, adjustment of the mA and/or kV according to patient size and/or use of iterative reconstruction technique. COMPARISON:  01/12/2021. FINDINGS: Brain: No acute intracranial hemorrhage, midline shift or mass effect. No extra-axial fluid collection. Periventricular and subcortical white matter hypodensities are noted bilaterally. No hydrocephalus. Mild atrophy is noted. Old infarcts are noted in the cerebellum bilaterally. A stable coarse calcification is present in the CP angle on the left. Vascular:  No hyperdense vessel or unexpected calcification. Skull: Stable postsurgical changes are present in the occipital bones bilaterally with surgical clips and aneurysm clip at the foramen magnum. No acute fracture. Sinuses/Orbits: No acute finding. Other: None. IMPRESSION: 1. No acute intracranial process. 2. Stable postsurgical changes and aneurysm clipping in the posterior fossa. 3. Atrophy with chronic microvascular ischemic changes and bilateral cerebellar infarcts. Electronically Signed   By: Brett Fairy M.D.   On: 03/27/2021 22:49  CT Cervical Spine Wo Contrast  Result Date: 03/28/2021 CLINICAL DATA:  Neck trauma EXAM: CT CERVICAL SPINE WITHOUT CONTRAST TECHNIQUE: Multidetector CT imaging of the cervical spine was performed without intravenous contrast. Multiplanar CT image reconstructions were also generated. RADIATION DOSE REDUCTION: This exam was performed according to the departmental dose-optimization program which includes automated exposure control, adjustment of the mA and/or kV according to patient size and/or use of iterative reconstruction technique. COMPARISON:  12/11/2020 FINDINGS: Alignment: No subluxation. Skull base and vertebrae: No acute fracture. No primary bone lesion or focal pathologic process. Soft tissues and spinal canal: No prevertebral fluid or swelling. No visible canal hematoma. Disc levels: Diffuse advanced degenerative disc disease. Moderate diffuse degenerative facet disease bilaterally. Upper chest: No acute findings Other: None IMPRESSION: Diffuse advanced degenerative disc and facet disease. No acute bony abnormality. Electronically Signed   By: Rolm Baptise M.D.   On: 03/28/2021 23:59   CT ABDOMEN PELVIS W CONTRAST  Result Date: 03/29/2021 CLINICAL DATA:  Epigastric and right upper quadrant pain EXAM: CT ABDOMEN AND PELVIS WITH CONTRAST TECHNIQUE: Multidetector CT imaging of the abdomen and pelvis was performed using the standard protocol following bolus  administration of intravenous contrast. RADIATION DOSE REDUCTION: This exam was performed according to the departmental dose-optimization program which includes automated exposure control, adjustment of the mA and/or kV according to patient size and/or use of iterative reconstruction technique. CONTRAST:  63mL OMNIPAQUE IOHEXOL 300 MG/ML  SOLN COMPARISON:  None. FINDINGS: LOWER CHEST: Normal. HEPATOBILIARY: Normal hepatic contours. No intra- or extrahepatic biliary dilatation. Status post cholecystectomy. PANCREAS: Normal pancreas. No ductal dilatation or peripancreatic fluid collection. SPLEEN: Normal. ADRENALS/URINARY TRACT: The adrenal glands are normal. No hydronephrosis. Bilateral calcifications in the renal pelvis. No ureteral obstruction. Small amount of gas in the anterior urinary bladder. STOMACH/BOWEL: There is no hiatal hernia. Normal duodenal course and caliber. There is a small ventral abdominal hernia that contains a loop of nonobstructed small bowel. Large stool ball in the rectum. Normal appendix. VASCULAR/LYMPHATIC: There is calcific atherosclerosis of the abdominal aorta. No lymphadenopathy. REPRODUCTIVE: Small amount of fluid in the endometrial cavity. No adnexal mass. MUSCULOSKELETAL. No bony spinal canal stenosis or focal osseous abnormality. OTHER: Subcutaneous gas in the right posterior tissues superficial to the gluteal muscles. IMPRESSION: 1. No acute abnormality of the abdomen or pelvis. 2. Small ventral abdominal hernia that contains a loop of nonobstructed small bowel. 3. Large stool ball in the rectum. 4. Small amount of gas in the anterior urinary bladder, query recent instrumentation. 5. Subcutaneous gas in the right posterior tissues superficial to the gluteal muscles. Aortic Atherosclerosis (ICD10-I70.0). Electronically Signed   By: Ulyses Jarred M.D.   On: 03/29/2021 02:19   EEG adult  Result Date: 03/30/2021 Greta Doom, MD     03/30/2021  5:46 PM History: 88 year old  female being evaluated for altered mental status Sedation: None Technique: This EEG was acquired with electrodes placed according to the International 10-20 electrode system (including Fp1, Fp2, F3, F4, C3, C4, P3, P4, O1, O2, T3, T4, T5, T6, A1, A2, Fz, Cz, Pz). The following electrodes were missing or displaced: none. Background: The background consists of intermixed alpha and beta activities. There is a well defined posterior dominant rhythm of 7-8 hz that attenuates with eye opening. Sleep is recorded with normal appearing structures. Photic stimulation: Physiologic driving is not performed EEG Abnormalities: 1) slow posterior dominant rhythm Clinical Interpretation: This mildly abnormal EEG could be consistent with a mild encephalopathy, though a slow posterior dominant  rhythm can be seen in the normal elderly as well. There was no seizure or seizure predisposition recorded on this study. Please note that lack of epileptiform activity on EEG does not preclude the possibility of epilepsy. Roland Rack, MD [facility] [phone] If 7pm- 7am, please page neurology on call as listed in [place].   DG Hip Unilat W or Wo Pelvis 2-3 Views Right  Result Date: 03/28/2021 CLINICAL DATA:  fall EXAM: DG HIP (WITH OR WITHOUT PELVIS) 2-3V RIGHT COMPARISON:  None. FINDINGS: Internal rotation of the right hip. There is no evidence of hip fracture or dislocation of the right hip. Frontal view of the left hip unremarkable. No acute displaced fracture or diastasis of the bones of the pelvis. There is no evidence of arthropathy or other focal bone abnormality. Degenerative changes of visualized lower lumbar spine. Stool within the rectum.  Atherosclerotic plaque. IMPRESSION: 1. No acute displaced fracture or dislocation. 2.  Aortic Atherosclerosis (ICD10-I70.0). Electronically Signed   By: Iven Finn M.D.   On: 03/28/2021 23:45    Microbiology: Recent Results (from the past 240 hour(s))  Resp Panel  by RT-PCR (Flu A&B, Covid) Nasopharyngeal Swab     Status: None   Collection Time: 03/27/21 11:54 PM   Specimen: Nasopharyngeal Swab; Nasopharyngeal(NP) swabs in vial transport medium  Result Value Ref Range Status   SARS Coronavirus 2 by RT PCR NEGATIVE NEGATIVE Final    Comment: (NOTE) SARS-CoV-2 target nucleic acids are NOT DETECTED.  The SARS-CoV-2 RNA is generally detectable in upper respiratory specimens during the acute phase of infection. The lowest concentration of SARS-CoV-2 viral copies this assay can detect is 138 copies/mL. A negative result does not preclude SARS-Cov-2 infection and should not be used as the sole basis for treatment or other patient management decisions. A negative result may occur with  improper specimen collection/handling, submission of specimen other than nasopharyngeal swab, presence of viral mutation(s) within the areas targeted by this assay, and inadequate number of viral copies(<138 copies/mL). A negative result must be combined with clinical observations, patient history, and epidemiological information. The expected result is Negative.  Fact Sheet for Patients:  EntrepreneurPulse.com.au  Fact Sheet for Healthcare Providers:  IncredibleEmployment.be  This test is no t yet approved or cleared by the [place] FDA and  has been authorized for detection and/or diagnosis of SARS-CoV-2 by FDA under an Emergency Use Authorization (EUA). This EUA will remain  in effect (meaning this test can be used) for the duration of the COVID-19 declaration under Section 564(b)(1) of the Act, 21 U.S.C.section 360bbb-3(b)(1), unless the authorization is terminated  or revoked sooner.       Influenza A by PCR NEGATIVE NEGATIVE Final   Influenza B by PCR NEGATIVE NEGATIVE Final    Comment: (NOTE) The Xpert Xpress SARS-CoV-2/FLU/RSV plus assay is intended as an aid in the diagnosis of influenza from Nasopharyngeal swab  specimens and should not be used as a sole basis for treatment. Nasal washings and aspirates are unacceptable for Xpert Xpress SARS-CoV-2/FLU/RSV testing.  Fact Sheet for Patients: EntrepreneurPulse.com.au  Fact Sheet for Healthcare Providers: IncredibleEmployment.be  This test is not yet approved or cleared by the [place] FDA and has been authorized for detection and/or diagnosis of SARS-CoV-2 by FDA under an Emergency Use Authorization (EUA). This EUA will remain in effect (meaning this test can be used) for the duration of the COVID-19 declaration under Section 564(b)(1) of the Act, 21 U.S.C. section 360bbb-3(b)(1), unless the authorization is terminated or revoked.  Performed at [facility], [place] [address], [place], [place] [address]   Blood culture (routine x 2)     Status: None (Preliminary result)   Collection Time: 03/28/21 12:49 AM   Specimen: Left Antecubital; Blood  Result Value Ref Range Status   Specimen Description   Final    LEFT ANTECUBITAL Performed at [place] [address], [place], [place] [address]    Special Requests   Final    BOTTLES DRAWN AEROBIC AND ANAEROBIC Blood Culture adequate volume Performed at [place] [address] [place], [place] [address]    Culture   Final    NO GROWTH 3 DAYS Performed at [facility], [place] [address], [place], [place] [address]    Report Status PENDING  Incomplete  Urine Culture     Status: Abnormal   Collection Time: 03/28/21 12:26 PM   Specimen: Urine, Clean Catch  Result Value Ref Range Status   Specimen Description   Final    URINE, CLEAN CATCH Performed at [facility], [place] [address], [place], [place] [address]    Special Requests   Final    NONE Performed at [facility], [place] [address], [place], [place] [address]    Culture (A)  Final    <10,000  COLONIES/mL INSIGNIFICANT GROWTH Performed at [place] [address], [place] [address]    Report Status 03/29/2021 FINAL  Final  Blood culture (routine x 2)     Status: None (Preliminary result)   Collection Time: 03/29/21 12:45 AM   Specimen: BLOOD LEFT FOREARM  Result Value Ref Range Status   Specimen Description   Final    BLOOD LEFT FOREARM Performed at [place] [address], [place], [place] [address]    Special Requests   Final    BOTTLES DRAWN AEROBIC AND ANAEROBIC Blood Culture adequate volume Performed at [place] [address], [place],  [address]    Culture   Final    NO GROWTH 3 DAYS Performed at [facility], [place] [address] [place],  [address]    Report Status PENDING  Incomplete  Resp Panel by RT-PCR (Flu A&B, Covid) Nasopharyngeal Swab     Status: None   Collection Time: 03/29/21  4:00 AM   Specimen: Nasopharyngeal Swab; Nasopharyngeal(NP) swabs in vial transport medium  Result Value Ref Range Status   SARS Coronavirus 2 by RT PCR NEGATIVE NEGATIVE Final    Comment: (NOTE) SARS-CoV-2 target nucleic acids are NOT DETECTED.  The SARS-CoV-2 RNA is generally detectable in upper respiratory specimens during the acute phase of infection. The lowest concentration of SARS-CoV-2 viral copies this assay can detect is 138 copies/mL. A negative result does not preclude SARS-Cov-2 infection and should not be used as the sole basis for treatment or other patient management decisions. A negative result may occur with  improper specimen collection/handling, submission of specimen other than nasopharyngeal swab, presence of viral mutation(s) within the areas targeted by this assay, and inadequate number of viral copies(<138 copies/mL). A negative result must be combined with clinical observations, patient history, and epidemiological information. The expected result is Negative.  Fact  Sheet for Patients:  EntrepreneurPulse.com.au  Fact Sheet for Healthcare Providers:  IncredibleEmployment.be  This test is no t yet approved or cleared by the [place] FDA and  has been authorized for detection and/or diagnosis of SARS-CoV-2 by FDA  under an Emergency Use Authorization (EUA). This EUA will remain  in effect (meaning this test can be used) for the duration of the COVID-19 declaration under Section 564(b)(1) of the Act, 21 U.S.C.section 360bbb-3(b)(1), unless the authorization is terminated  or revoked sooner.       Influenza A by PCR NEGATIVE NEGATIVE Final   Influenza B by PCR NEGATIVE NEGATIVE Final    Comment: (NOTE) The Xpert Xpress SARS-CoV-2/FLU/RSV plus assay is intended as an aid in the diagnosis of influenza from Nasopharyngeal swab specimens and should not be used as a sole basis for treatment. Nasal washings and aspirates are unacceptable for Xpert Xpress SARS-CoV-2/FLU/RSV testing.  Fact Sheet for Patients: EntrepreneurPulse.com.au  Fact Sheet for Healthcare Providers: IncredibleEmployment.be  This test is not yet approved or cleared by the [place] FDA and has been authorized for detection and/or diagnosis of SARS-CoV-2 by FDA under an Emergency Use Authorization (EUA). This EUA will remain in effect (meaning this test can be used) for the duration of the COVID-19 declaration under Section 564(b)(1) of the Act, 21 U.S.C. section 360bbb-3(b)(1), unless the authorization is terminated or revoked.  Performed at [facility], [place] [address] [place], [place] [address]   Urine Culture     Status: Abnormal   Collection Time: 03/29/21  9:58 AM   Specimen: Urine, Catheterized  Result Value Ref Range Status   Specimen Description   Final    URINE, CATHETERIZED Performed at [place] [address], [place], [place] [address]     Special Requests   Final    NONE Performed at [facility], [place] [address] [place], [place] [address]    Culture MULTIPLE SPECIES PRESENT, SUGGEST RECOLLECTION (A)  Final   Report Status 03/30/2021 FINAL  Final  Resp Panel by RT-PCR (Flu A&B, Covid) Nasopharyngeal Swab     Status: None   Collection Time: 04/01/21 11:51 AM   Specimen: Nasopharyngeal Swab; Nasopharyngeal(NP) swabs in vial transport medium  Result Value Ref Range Status   SARS Coronavirus 2 by RT PCR NEGATIVE NEGATIVE Final    Comment: (NOTE) SARS-CoV-2 target nucleic acids are NOT DETECTED.  The SARS-CoV-2 RNA is generally detectable in upper respiratory specimens during the acute phase of infection. The lowest concentration of SARS-CoV-2 viral copies this assay can detect is 138 copies/mL. A negative result does not preclude SARS-Cov-2 infection and should not be used as the sole basis for treatment or other patient management decisions. A negative result may occur with  improper specimen collection/handling, submission of specimen other than nasopharyngeal swab, presence of viral mutation(s) within the areas targeted by this assay, and inadequate number of viral copies(<138 copies/mL). A negative result must be combined with clinical observations, patient history, and epidemiological information. The expected result is Negative.  Fact Sheet for Patients:  EntrepreneurPulse.com.au  Fact Sheet for Healthcare Providers:  IncredibleEmployment.be  This test is no t yet approved or cleared by the [place] FDA and  has been authorized for detection and/or diagnosis of SARS-CoV-2 by FDA under an Emergency Use Authorization (EUA). This EUA will remain  in effect (meaning this test can be used) for the duration of the COVID-19 declaration under Section 564(b)(1) of the Act, 21 U.S.C.section 360bbb-3(b)(1), unless the authorization is terminated  or revoked  sooner.       Influenza A by PCR NEGATIVE NEGATIVE Final   Influenza B by PCR NEGATIVE NEGATIVE Final    Comment: (NOTE) The Xpert Xpress SARS-CoV-2/FLU/RSV plus  assay is intended as an aid in the diagnosis of influenza from Nasopharyngeal swab specimens and should not be used as a sole basis for treatment. Nasal washings and aspirates are unacceptable for Xpert Xpress SARS-CoV-2/FLU/RSV testing.  Fact Sheet for Patients: EntrepreneurPulse.com.au  Fact Sheet for Healthcare Providers: IncredibleEmployment.be  This test is not yet approved or cleared by the [place] FDA and has been authorized for detection and/or diagnosis of SARS-CoV-2 by FDA under an Emergency Use Authorization (EUA). This EUA will remain in effect (meaning this test can be used) for the duration of the COVID-19 declaration under Section 564(b)(1) of the Act, 21 U.S.C. section 360bbb-3(b)(1), unless the authorization is terminated or revoked.  Performed at [facility], [place] [address], [place], [place] [address]      Labs: Basic Metabolic Panel: Recent Labs  Lab 03/28/21 2243 03/29/21 0400 03/30/21 0521 03/31/21 0534 04/01/21 0816  NA 144 142 145 147* 142  K 4.3 4.4 4.0 3.6 3.0*  CL 112* 114* 116* 117* 113*  CO2 23 17* 20* 23 23  GLUCOSE 143* 110* 89 147* 179*  BUN 42* 38* 40* 42* 29*  CREATININE 0.99 0.77 0.94 0.70 0.55  CALCIUM 10.5* 9.5 9.8 9.8 9.1  MG  --  2.1  --   --   --   PHOS  --   --   --  3.0  --    Liver Function Tests: Recent Labs  Lab 03/28/21 0010 03/28/21 2243 03/29/21 0400 03/30/21 0521  AST 30 31 33 32  ALT 33 31 28 26   ALKPHOS 113 116 102 96  BILITOT 0.4 0.4 0.7 0.8  PROT 7.0 7.0 6.3* 6.2*  ALBUMIN 4.0 3.9 3.4* 3.4*   No results for input(s): LIPASE, AMYLASE in the last 168 hours. No results for input(s): AMMONIA in the last 168 hours. CBC: Recent Labs  Lab 03/28/21 0010 03/28/21 2243 03/29/21 0400  03/30/21 0521  WBC 9.2 11.0* 11.7* 9.4  NEUTROABS 6.1 8.6* 7.9*  --   HGB 12.9 13.1 12.3 11.3*  HCT 39.8 40.6 37.7 36.0  MCV 88.8 90.4 90.4 92.8  PLT 185 194 187 179   Cardiac Enzymes: No results for input(s): CKTOTAL, CKMB, CKMBINDEX, TROPONINI in the last 168 hours. BNP: BNP (last 3 results) Recent Labs    02/28/21 0600  BNP 216.5*    ProBNP (last 3 results) No results for input(s): PROBNP in the last 8760 hours.  CBG: No results for input(s): GLUCAP in the last 168 hours.  FURTHER DISCHARGE INSTRUCTIONS:   Get Medicines reviewed and adjusted: Please take all your medications with you for your next visit with your Primary MD   Laboratory/radiological data: Please request your Primary MD to go over all hospital tests and procedure/radiological results at the follow up, please ask your Primary MD to get all Hospital records sent to his/her office.   In some cases, they will be blood work, cultures and biopsy results pending at the time of your discharge. Please request that your primary care M.D. goes through all the records of your hospital data and follows up on these results.   Also Note the following: If you experience worsening of your admission symptoms, develop shortness of breath, life threatening emergency, suicidal or homicidal thoughts you must seek medical attention immediately by calling 911 or calling your MD immediately  if symptoms less severe.   You must read complete instructions/literature along with all the possible adverse reactions/side effects for all the Medicines you take  and that have been prescribed to you. Take any new Medicines after you have completely understood and accpet all the possible adverse reactions/side effects.    Do not drive when taking Pain medications or sleeping medications (Benzodaizepines)   Do not take more than prescribed Pain, Sleep and Anxiety Medications. It is not advisable to combine anxiety,sleep and pain medications  without talking with your primary care practitioner   Special Instructions: If you have smoked or chewed Tobacco  in the last 2 yrs please stop smoking, stop any regular Alcohol  and or any Recreational drug use.   Wear Seat belts while driving.   Please note: You were cared for by a hospitalist during your hospital stay. Once you are discharged, your primary care physician will handle any further medical issues. Please note that NO REFILLS for any discharge medications will be authorized once you are discharged, as it is imperative that you return to your primary care physician (or establish a relationship with a primary care physician if you do not have one) for your post hospital discharge needs so that they can reassess your need for medications and monitor your lab values.     Signed:  Florencia Reasons MD, PhD, FACP  [facility] 04/01/2021, 1:56 PM

## 2021-04-01 NOTE — TOC Transition Note (Signed)
Transition of Care [facility]) - CM/SW Discharge Note  Patient Details  Name: Kristina Wilkinson MRN: [id number] Date of Birth: 12/20/1927  [facility] CM/SW Contact:  Kristina Wilkinson, Kristina Wilkinson Phone Number: 04/01/2021, 2:48 PM  Clinical Narrative: Patient stable to discharge back to [facility] today. COVID test is negative. CSW confirmed admission with Kristina Wilkinson at [facility]. CSW spoke with cousin, Kristina Wilkinson, as admission paperwork and bill will need to be addressed prior to admission. FL2 done. CSW faxed discharge summary, discharge orders, FL2, and SNF transfer report to facility in hub. Medical necessity form done; PTAR scheduled. Discharge packet completed. RN updated. TOC signing off.  Final next level of care: Skilled Nursing Facility Barriers to Discharge: Barriers Resolved  Patient Goals and CMS Choice Patient states their goals for this hospitalization and ongoing recovery are:: Return to [facility] CMS Medicare.gov Compare Post Acute Care list provided to:: Patient Represenative (must comment) (Kristina Wilkinson (cousin))  Discharge Placement        Patient chooses bed at: [facility] Patient to be transferred to facility by: [facility] Name of family member notified: Kristina Wilkinson (cousin) Patient and family notified of of transfer: 04/01/21  Discharge Plan and Services Discharge Planning Services: CM Consult        DME Arranged: N/A DME Agency: NA  Readmission Risk Interventions Readmission Risk Prevention Plan 03/30/2021 03/01/2021  Transportation Screening Complete Complete  PCP or Specialist Appt within 3-5 Days - Complete  HRI or [place] - Complete  Social Work Consult for [place] Planning/Counseling - Complete  Palliative Care Screening - Not Applicable  Medication Review Press photographer) Complete Complete  PCP or Specialist appointment within 3-5 days of discharge Complete -  [place] or Home Care Consult Complete -  SW Recovery Care/Counseling  Consult Complete -  Palliative Care Screening Not Applicable -  Skilled Nursing Facility Complete -  Some recent data might be hidden

## 2021-04-01 NOTE — Evaluation (Signed)
Physical Therapy Evaluation Patient Details Name: Kristina Wilkinson MRN: [id number] DOB: [birth year] Today's Date: 04/01/2021  History of Present Illness  Kristina Wilkinson is a 88 y.o. female with medical history significant of DM2, GERD, CNS aneurysm s/p clipping, DVT, HTN. Presenting  from [facility] 03/28/21 with altered mental status,Hypernatremia/elevated BUN  -Likely due to poor oral intake/dehydration.Patient in ED 2/13 and DC back to SNF for same.  Clinical Impression  Time was spent speaking with patient about benefits of getting up to recliner. Patient adamantly refused, stating" I am not going to get up. When I say no, I mean no."  Pt evaluation limited. Unknown patient's PLOF, has been at [facility] for several months, it appears.  PT will provide trial of therapy, if Patient long term resident, then recommend return to LTC.      Recommendations for follow up therapy are one component of a multi-disciplinary discharge planning process, led by the attending physician.  Recommendations may be updated based on patient status, additional functional criteria and insurance authorization.  Follow Up Recommendations Long term institutional care without PT follow up   Assistance Recommended at Discharge Frequent or constant Supervision/Assistance  Patient can return home with the following  Two people to help with walking and/or transfers;A lot of help with bathing/dressing/bathroom;Direct supervision/assist for medications management;Assist for transportation    Equipment Recommendations None recommended by PT  Recommendations for Other Services       Functional Status Assessment Patient has not had a recent decline in their functional status     Precautions / Restrictions Precautions Precautions: Fall Restrictions Weight Bearing Restrictions: No      Mobility  Bed Mobility               General bed mobility comments: made several atempts to assist patient to sitting on  the bed edge. patient pulled covers back and stated" I am not going to sit up today."    Transfers                   General transfer comment: pt declined    Ambulation/Gait                  Stairs            Wheelchair Mobility    Modified Rankin (Stroke Patients Only)       Balance                                             Pertinent Vitals/Pain Pain Assessment Faces Pain Scale: Hurts little more    Home Living Family/patient expects to be discharged to:: Skilled nursing facility                   Additional Comments: this info was collected from chart review. patient was unable to give PLOF. patient is very confused and not  able to procide information    Prior Function Prior Level of Function : Patient poor historian/Family not available             Mobility Comments: unsure of  function at SNF       Hand Dominance        Extremity/Trunk Assessment   Upper Extremity Assessment Upper Extremity Assessment: Generalized weakness    Lower Extremity Assessment Lower Extremity Assessment:  (limited eval as pt would not move legs  in bed)    Cervical / Trunk Assessment Cervical / Trunk Assessment: Other exceptions Cervical / Trunk Exceptions: sitting upright in the bed at midline  Communication   Communication: HOH  Cognition Arousal/Alertness: Awake/alert Behavior During Therapy: Agitated (the more PT encouraged patient to sit up, even check Purewick) Overall Cognitive Status: No family/caregiver present to determine baseline cognitive functioning Area of Impairment: Orientation, Awareness                 Orientation Level: Place, Time, Situation         Awareness: Emergent   General Comments: aware that she has something to "catch my urine:, lifts cover to show PT, will not paticipate any further        General Comments General comments (skin integrity, edema, etc.): unable to test     Exercises     Assessment/Plan    PT Assessment Patient needs continued PT services  PT Problem List Decreased mobility;Decreased knowledge of precautions;Decreased cognition;Decreased activity tolerance       PT Treatment Interventions Therapeutic activities;Gait training;Patient/family education;Therapeutic exercise;Cognitive remediation;Functional mobility training    PT Goals (Current goals can be found in the Care Plan section)  Acute Rehab PT Goals Patient Stated Goal: I am not going to get up PT Goal Formulation: Patient unable to participate in goal setting Time For Goal Achievement: 04/15/21 Potential to Achieve Goals: Fair    Frequency Min 2X/week     Co-evaluation               AM-PAC PT "6 Clicks" Mobility  Outcome Measure Help needed turning from your back to your side while in a flat bed without using bedrails?: Total Help needed moving from lying on your back to sitting on the side of a flat bed without using bedrails?: Total Help needed moving to and from a bed to a chair (including a wheelchair)?: Total Help needed standing up from a chair using your arms (e.g., wheelchair or bedside chair)?: Total Help needed to walk in hospital room?: Total Help needed climbing 3-5 steps with a railing? : Total 6 Click Score: 6    End of Session   Activity Tolerance: Treatment limited secondary to agitation Patient left: in bed;with call bell/phone within reach;with bed alarm set Nurse Communication: Mobility status PT Visit Diagnosis: Unsteadiness on feet (R26.81);Adult, failure to thrive (R62.7)    Time: 0940-0959 PT Time Calculation (min) (ACUTE ONLY): 19 min   Charges:   PT Evaluation $PT Eval Low Complexity: [place] PT Acute Rehabilitation Services Pager [phone] Office [phone]    Claretha Cooper 04/01/2021, 10:13 AM

## 2021-04-01 NOTE — NC FL2 (Signed)
[place] MEDICAID FL2 LEVEL OF CARE SCREENING TOOL     IDENTIFICATION  Patient Name: Kristina Wilkinson Birthdate: [birth year] Sex: female Admission Date (Current Location): 03/28/2021  [facility] and [place] Number:  Herbalist and Address:  [facility],  [address] [place], [place]      Provider Number: [id number]  Attending Physician Name and Address:  Florencia Reasons, MD  Relative Name and Phone Number:  [name] (cousin) Ph: [phone]    Current Level of Care: Hospital Recommended Level of Care: [place] Prior Approval Number:    Date Approved/Denied:   PASRR Number:    Discharge Plan: SNF    Current Diagnoses: Patient Active Problem List   Diagnosis Date Noted   Acute encephalopathy 03/29/2021   Pressure injury of skin 03/01/2021   Sepsis secondary to UTI ([place]) 02/28/2021   Dementia without behavioral disturbance ([place]) 02/28/2021   Normocytic anemia 02/28/2021   Atrial fibrillation, chronic ([place]) 09/25/2020   Restless leg syndrome 03/14/2019   Sepsis due to gram-negative UTI ([place]) 03/06/2019   Sepsis due to urinary tract infection ([place]) 03/05/2019   Falls 03/05/2019   Open toe wound 12/01/2018   Traumatic rhabdomyolysis ([place]) 11/30/2018   Cellulitis 11/30/2018   Transaminitis 11/30/2018   Abnormal urinalysis 11/30/2018   Leukocytosis 11/30/2018   Hypokalemia 11/30/2018   Scalp laceration    Skull fracture ([place]) 07/13/2016   Chronic left shoulder pain 04/03/2016   DM2 (diabetes mellitus, type 2) ([place]) 06/27/2015   Hearing loss 06/21/2015   Advance care planning 12/17/2014   Palpitations 07/06/2014   Pain in joint, ankle and foot 04/20/2014   DVT, lower extremity ([place]) 11/19/2013   Edema 10/20/2013   Headache(784.0) 10/20/2013   Left leg swelling 10/20/2013   Aneurysm of basilar artery (HCC) 10/20/2013   Bradycardia 10/20/2013   Vitamin B12 deficiency 07/16/2013   Paresthesia of both feet 05/17/2013   TMJ  arthritis 09/11/2012   Ear pain 09/11/2012   History of transfusion of packed red blood cells 09/11/2012   Spinal stenosis 04/13/2011   Well adult exam 01/23/2011   Cholecystitis with cholelithiasis, s/p lap chole 11/10/2010 12/01/2010   Shoulder pain, right 07/25/2010   TOBACCO USE, QUIT 03/09/2009   KNEE PAIN 05/18/2008   PN (peripheral neuropathy) 08/19/2007   LEG PAIN 08/19/2007   Vitamin D deficiency 02/17/2007   INSOMNIA, PERSISTENT 02/17/2007   HTN (hypertension) 02/17/2007   GERD 02/17/2007   Osteoarthritis 02/17/2007   LOW BACK PAIN 02/17/2007   Hyperglycemia 01/28/2007   UNSPECIFIED OSTEOPOROSIS 01/28/2007   PRECIPITOUS DROP IN HEMATOCRIT 01/28/2007    Orientation RESPIRATION BLADDER Height & Weight     Self, Time, Place  Normal Incontinent Weight: 143 lb 11.8 oz (65.2 kg) Height:  5\' 6"  (167.6 cm)  BEHAVIORAL SYMPTOMS/MOOD NEUROLOGICAL BOWEL NUTRITION STATUS      Incontinent Diet (Carb modified)  AMBULATORY STATUS COMMUNICATION OF NEEDS Skin   Extensive Assist Verbally Skin abrasions, Bruising                       Personal Care Assistance Level of Assistance  Bathing, Feeding, Dressing Bathing Assistance: Limited assistance Feeding assistance: Limited assistance Dressing Assistance: Limited assistance     Functional Limitations Info  Sight, Hearing, Speech Sight Info: Adequate Hearing Info: Adequate Speech Info: Adequate    SPECIAL CARE FACTORS FREQUENCY  Contractures Contractures Info: Not present    Additional Factors Info  Code Status, Allergies Code Status Info: Partial Allergies Info: Gabapentin, Other, Penicillins, Pregabalin, Vit D-vit E-safflower Oil           Current Medications (04/01/2021):  This is the current hospital active medication list Current Facility-Administered Medications  Medication Dose Route Frequency Provider Last Rate Last Admin   acetaminophen (TYLENOL) tablet 650 mg  650 mg Oral  Q6H PRN Marylyn Ishihara, Tyrone A, DO       Or   acetaminophen (TYLENOL) suppository 650 mg  650 mg Rectal Q6H PRN Marylyn Ishihara, Tyrone A, DO       amLODipine (NORVASC) tablet 10 mg  10 mg Oral Daily Kyle, Tyrone A, DO   10 mg at 03/31/21 0936   apixaban (ELIQUIS) tablet 2.5 mg  2.5 mg Oral BID Florencia Reasons, MD   2.5 mg at 04/01/21 1009   bisacodyl (DULCOLAX) suppository 10 mg  10 mg Rectal Daily Marylyn Ishihara, Tyrone A, DO   10 mg at 03/31/21 0936   busPIRone (BUSPAR) tablet 5 mg  5 mg Oral BID Marylyn Ishihara, Tyrone A, DO   5 mg at 03/31/21 2041   labetalol (NORMODYNE) injection 5 mg  5 mg Intravenous Q2H PRN Florencia Reasons, MD       lisinopril (ZESTRIL) tablet 40 mg  40 mg Oral Daily Marylyn Ishihara, Tyrone A, DO   40 mg at 03/31/21 0936   metoprolol succinate (TOPROL-XL) 24 hr tablet 12.5 mg  12.5 mg Oral Daily Marylyn Ishihara, Tyrone A, DO   12.5 mg at 03/31/21 0936   ondansetron (ZOFRAN) tablet 4 mg  4 mg Oral Q6H PRN Cherylann Ratel A, DO       Or   ondansetron (ZOFRAN) injection 4 mg  4 mg Intravenous Q6H PRN Marylyn Ishihara, Tyrone A, DO       polyethylene glycol (MIRALAX / GLYCOLAX) packet 17 g  17 g Oral BID Marylyn Ishihara, Tyrone A, DO   17 g at 03/31/21 2044     Discharge Medications: Please see discharge summary for a list of discharge medications.  Relevant Imaging Results:  Relevant Lab Results:   Additional Information SSN: [id number]  Sherie Don, LCSW

## 2021-04-03 LAB — CULTURE, BLOOD (ROUTINE X 2)
Culture: NO GROWTH
Culture: NO GROWTH
Special Requests: ADEQUATE
Special Requests: ADEQUATE

## 2021-04-29 ENCOUNTER — Other Ambulatory Visit: Payer: Self-pay

## 2021-04-29 ENCOUNTER — Emergency Department (HOSPITAL_COMMUNITY): Payer: Medicare Other

## 2021-04-29 ENCOUNTER — Inpatient Hospital Stay (HOSPITAL_COMMUNITY)
Admission: EM | Admit: 2021-04-29 | Discharge: 2021-05-02 | DRG: 070 | Disposition: A | Discharge status: Skilled Nursing Facility | Payer: Medicare Other | Source: Skilled Nursing Facility | Attending: Internal Medicine | Admitting: Internal Medicine

## 2021-04-29 DIAGNOSIS — E860 Dehydration: Secondary | ICD-10-CM | POA: Diagnosis present

## 2021-04-29 DIAGNOSIS — G9340 Encephalopathy, unspecified: Secondary | ICD-10-CM | POA: Diagnosis not present

## 2021-04-29 DIAGNOSIS — G9349 Other encephalopathy: Principal | ICD-10-CM | POA: Diagnosis present

## 2021-04-29 DIAGNOSIS — K5900 Constipation, unspecified: Secondary | ICD-10-CM | POA: Diagnosis present

## 2021-04-29 DIAGNOSIS — F0390 Unspecified dementia without behavioral disturbance: Secondary | ICD-10-CM | POA: Diagnosis present

## 2021-04-29 DIAGNOSIS — Z87891 Personal history of nicotine dependence: Secondary | ICD-10-CM

## 2021-04-29 DIAGNOSIS — G4700 Insomnia, unspecified: Secondary | ICD-10-CM | POA: Diagnosis present

## 2021-04-29 DIAGNOSIS — T68XXXA Hypothermia, initial encounter: Secondary | ICD-10-CM | POA: Diagnosis present

## 2021-04-29 DIAGNOSIS — E870 Hyperosmolality and hypernatremia: Secondary | ICD-10-CM | POA: Diagnosis present

## 2021-04-29 DIAGNOSIS — Z66 Do not resuscitate: Secondary | ICD-10-CM | POA: Diagnosis present

## 2021-04-29 DIAGNOSIS — I10 Essential (primary) hypertension: Secondary | ICD-10-CM | POA: Diagnosis present

## 2021-04-29 DIAGNOSIS — Z880 Allergy status to penicillin: Secondary | ICD-10-CM

## 2021-04-29 DIAGNOSIS — E119 Type 2 diabetes mellitus without complications: Secondary | ICD-10-CM | POA: Diagnosis present

## 2021-04-29 DIAGNOSIS — Z7401 Bed confinement status: Secondary | ICD-10-CM

## 2021-04-29 DIAGNOSIS — Z7901 Long term (current) use of anticoagulants: Secondary | ICD-10-CM

## 2021-04-29 DIAGNOSIS — Z79899 Other long term (current) drug therapy: Secondary | ICD-10-CM

## 2021-04-29 DIAGNOSIS — L89613 Pressure ulcer of right heel, stage 3: Secondary | ICD-10-CM | POA: Diagnosis present

## 2021-04-29 DIAGNOSIS — Z8673 Personal history of transient ischemic attack (TIA), and cerebral infarction without residual deficits: Secondary | ICD-10-CM

## 2021-04-29 DIAGNOSIS — Z96652 Presence of left artificial knee joint: Secondary | ICD-10-CM | POA: Diagnosis present

## 2021-04-29 DIAGNOSIS — F03918 Unspecified dementia, unspecified severity, with other behavioral disturbance: Secondary | ICD-10-CM | POA: Diagnosis present

## 2021-04-29 DIAGNOSIS — Z86718 Personal history of other venous thrombosis and embolism: Secondary | ICD-10-CM

## 2021-04-29 DIAGNOSIS — I482 Chronic atrial fibrillation, unspecified: Secondary | ICD-10-CM | POA: Diagnosis present

## 2021-04-29 DIAGNOSIS — Z8261 Family history of arthritis: Secondary | ICD-10-CM

## 2021-04-29 DIAGNOSIS — M1990 Unspecified osteoarthritis, unspecified site: Secondary | ICD-10-CM | POA: Diagnosis present

## 2021-04-29 DIAGNOSIS — R4182 Altered mental status, unspecified: Principal | ICD-10-CM

## 2021-04-29 DIAGNOSIS — L89623 Pressure ulcer of left heel, stage 3: Secondary | ICD-10-CM | POA: Diagnosis present

## 2021-04-29 DIAGNOSIS — Z9049 Acquired absence of other specified parts of digestive tract: Secondary | ICD-10-CM

## 2021-04-29 DIAGNOSIS — Z888 Allergy status to other drugs, medicaments and biological substances status: Secondary | ICD-10-CM

## 2021-04-29 DIAGNOSIS — Z91018 Allergy to other foods: Secondary | ICD-10-CM

## 2021-04-29 LAB — CBC WITH DIFFERENTIAL/PLATELET
Abs Immature Granulocytes: 0.03 10*3/uL (ref 0.00–0.07)
Basophils Absolute: 0 10*3/uL (ref 0.0–0.1)
Basophils Relative: 0 %
Eosinophils Absolute: 0.1 10*3/uL (ref 0.0–0.5)
Eosinophils Relative: 2 %
HCT: 38.3 % (ref 36.0–46.0)
Hemoglobin: 12.3 g/dL (ref 12.0–15.0)
Immature Granulocytes: 1 %
Lymphocytes Relative: 22 %
Lymphs Abs: 1.3 10*3/uL (ref 0.7–4.0)
MCH: 28.9 pg (ref 26.0–34.0)
MCHC: 32.1 g/dL (ref 30.0–36.0)
MCV: 89.9 fL (ref 80.0–100.0)
Monocytes Absolute: 0.5 10*3/uL (ref 0.1–1.0)
Monocytes Relative: 9 %
Neutro Abs: 4.2 10*3/uL (ref 1.7–7.7)
Neutrophils Relative %: 66 %
Platelets: 188 10*3/uL (ref 150–400)
RBC: 4.26 MIL/uL (ref 3.87–5.11)
RDW: 15 % (ref 11.5–15.5)
WBC: 6.2 10*3/uL (ref 4.0–10.5)
nRBC: 0 % (ref 0.0–0.2)

## 2021-04-29 LAB — URINALYSIS, ROUTINE W REFLEX MICROSCOPIC
Bilirubin Urine: NEGATIVE
Glucose, UA: NEGATIVE mg/dL
Hgb urine dipstick: NEGATIVE
Ketones, ur: NEGATIVE mg/dL
Leukocytes,Ua: NEGATIVE
Nitrite: NEGATIVE
Protein, ur: NEGATIVE mg/dL
Specific Gravity, Urine: 1.021 (ref 1.005–1.030)
pH: 5 (ref 5.0–8.0)

## 2021-04-29 LAB — COMPREHENSIVE METABOLIC PANEL
ALT: 24 U/L (ref 0–44)
AST: 25 U/L (ref 15–41)
Albumin: 3 g/dL — ABNORMAL LOW (ref 3.5–5.0)
Alkaline Phosphatase: 105 U/L (ref 38–126)
Anion gap: 11 (ref 5–15)
BUN: 19 mg/dL (ref 8–23)
CO2: 23 mmol/L (ref 22–32)
Calcium: 9.8 mg/dL (ref 8.9–10.3)
Chloride: 112 mmol/L — ABNORMAL HIGH (ref 98–111)
Creatinine, Ser: 0.62 mg/dL (ref 0.44–1.00)
GFR, Estimated: >60 mL/min (ref >60)
Glucose, Bld: 113 mg/dL — ABNORMAL HIGH (ref 70–99)
Potassium: 3.4 mmol/L — ABNORMAL LOW (ref 3.5–5.1)
Sodium: 146 mmol/L — ABNORMAL HIGH (ref 135–145)
Total Bilirubin: 0.4 mg/dL (ref 0.3–1.2)
Total Protein: 6 g/dL — ABNORMAL LOW (ref 6.5–8.1)

## 2021-04-29 LAB — APTT: aPTT: 31 seconds (ref 24–36)

## 2021-04-29 LAB — I-STAT CHEM 8, ED
BUN: 21 mg/dL (ref 8–23)
Calcium, Ion: 1.31 mmol/L (ref 1.15–1.40)
Chloride: 113 mmol/L — ABNORMAL HIGH (ref 98–111)
Creatinine, Ser: 0.6 mg/dL (ref 0.44–1.00)
Glucose, Bld: 111 mg/dL — ABNORMAL HIGH (ref 70–99)
HCT: 36 % (ref 36.0–46.0)
Hemoglobin: 12.2 g/dL (ref 12.0–15.0)
Potassium: 3.4 mmol/L — ABNORMAL LOW (ref 3.5–5.1)
Sodium: 146 mmol/L — ABNORMAL HIGH (ref 135–145)
TCO2: 25 mmol/L (ref 22–32)

## 2021-04-29 LAB — PROTIME-INR
INR: 1.1 (ref 0.8–1.2)
Prothrombin Time: 14.1 seconds (ref 11.4–15.2)

## 2021-04-29 LAB — LACTIC ACID, PLASMA: Lactic Acid, Venous: 1.3 mmol/L (ref 0.5–1.9)

## 2021-04-29 NOTE — ED Provider Notes (Signed)
?[place] ?Provider Note ? ? ?CSN: [id number] ?Arrival date & time: 04/29/21  1932 ? ?  ? ?History ? ?Chief Complaint  ?Patient presents with  ? Altered Mental Status  ? ? ?Kristina Wilkinson is a 88 y.o. female with history of dementia who lives at [facility] rehab facility who presents via EMS for altered mental status and reported strong odor to her urine. ?Level 5 caveat due to patient's altered mental status and acuity of presentation upon arrival. ? ?Patient was recently admitted to the hospital medicine serviceDischarged on 2/18.  She was admitted at that time for questionable metabolic encephalopathy for dehydration.  Treated on antibiotics for urinary tract infection; urine culture from that time with multiple species, poor specimen. ? ?Patient returned to her baseline of pleasant confusion at time of discharge. ? ?Upon arrival to the emerged part today she is screaming, yelling at the EMS people, striking out. Patient is anticoagulated on eliquis.  ? ?HPI ? ?  ? ?Home Medications ?Prior to Admission medications   ?Medication Sig Start Date End Date Taking? Authorizing Provider  ?acetaminophen (TYLENOL) 325 MG tablet Take 2 tablets (650 mg total) by mouth every 6 (six) hours as needed for mild pain (or Fever >/= 101). ?Patient not taking: Reported on 03/29/2021 10/10/20   Raiford Noble Latif, DO  ?apixaban (ELIQUIS) 5 MG TABS tablet Take 1 tablet (5 mg total) by mouth 2 (two) times daily. 10/10/20   Raiford Noble Latif, DO  ?bisacodyl (DULCOLAX) 10 MG suppository Place 1 suppository (10 mg total) rectally daily as needed for moderate constipation. ?Patient not taking: Reported on 03/29/2021 10/10/20   Raiford Noble Latif, DO  ?busPIRone (BUSPAR) 5 MG tablet Take 5 mg by mouth in the morning and at bedtime.    [provider]  ?metoprolol succinate (TOPROL-XL) 25 MG 24 hr tablet Take 0.5 tablets (12.5 mg total) by mouth daily. 10/10/20   Raiford Noble Latif, DO   ?polyethylene glycol (MIRALAX / GLYCOLAX) 17 g packet Take 17 g by mouth 2 (two) times daily. 10/10/20   Raiford Noble Latif, DO  ?polyvinyl alcohol (LIQUIFILM TEARS) 1.4 % ophthalmic solution Place 1 drop into both eyes as needed for dry eyes. ?Patient not taking: Reported on 03/29/2021 10/10/20   Raiford Noble Latif, DO  ?potassium chloride SA (KLOR-CON M) 20 MEQ tablet Take 40 mEq by mouth daily. 02/20/21   [provider]  ?senna (SENOKOT) 8.6 MG TABS tablet Take 8.6 mg by mouth in the morning and at bedtime.    [provider]  ?UNABLE TO FIND Take 90 mLs by mouth 2 (two) times daily. Med Name: Medpass 2.0    [provider]  ?   ? ?Allergies    ?Gabapentin, Other, Penicillins, Pregabalin, and Vit d-vit e-safflower oil   ? ?Review of Systems   ?Review of Systems  ?Unable to perform ROS: Mental status change  ? ?Physical Exam ?Updated Vital Signs ?BP (!) 156/73 (BP Location: Right Arm)   Temp (!) 94.8 ?F (34.9 ?C) (Rectal)   Resp 20   SpO2 95%  ?Physical Exam ?Vitals and nursing note reviewed.  ?Constitutional:   ?   General: She is sleeping.  ?   Appearance: She is ill-appearing. She is not toxic-appearing.  ?HENT:  ?   Head: Normocephalic and atraumatic.  ?   Nose: Nose normal.  ?   Mouth/Throat:  ?   Mouth: Mucous membranes are moist.  ?   Pharynx: Oropharynx is  clear. Uvula midline. No oropharyngeal exudate or posterior oropharyngeal erythema.  ?   Tonsils: No tonsillar exudate.  ?Eyes:  ?   General: Lids are normal. Vision grossly intact.     ?   Right eye: No discharge.     ?   Left eye: No discharge.  ?   Conjunctiva/sclera: Conjunctivae normal.  ?   Pupils: Pupils are equal, round, and reactive to light.  ?Neck:  ?   Trachea: Trachea and phonation normal.  ?   Meningeal: Brudzinski's sign and Kernig's sign absent.  ?Cardiovascular:  ?   Rate and Rhythm: Tachycardia present. Rhythm irregularly irregular.  ?   Pulses: Normal pulses.  ?   Heart sounds: Normal heart sounds. No  murmur heard. ?   Comments: Atrial fibrillation ?Pulmonary:  ?   Effort: Pulmonary effort is normal. No tachypnea, bradypnea, accessory muscle usage, prolonged expiration or respiratory distress.  ?   Breath sounds: Examination of the right-middle field reveals rhonchi. Examination of the left-middle field reveals rhonchi. Examination of the right-lower field reveals rhonchi. Examination of the left-lower field reveals rhonchi. Rhonchi present. No wheezing or rales.  ?   Comments: Coarse breath sounds throughout ?Chest:  ?   Chest wall: No mass, lacerations, deformity, swelling, tenderness, crepitus or edema.  ?Abdominal:  ?   General: Bowel sounds are normal. There is no distension.  ?   Palpations: Abdomen is soft.  ?   Tenderness: There is no abdominal tenderness. There is no right CVA tenderness, left CVA tenderness, guarding or rebound.  ?Musculoskeletal:     ?   General: No deformity.  ?   Cervical back: Normal range of motion and neck supple.  ?   Right lower leg: 1+ Edema present.  ?   Left lower leg: 1+ Edema present.  ?Lymphadenopathy:  ?   Cervical: No cervical adenopathy.  ?Skin: ?   General: Skin is warm and dry.  ?   Capillary Refill: Capillary refill takes less than 2 seconds.  ?   Comments: Multiple skin tears on the forearms and left lower leg.   ?Neurological:  ?   Mental Status: She is disoriented.  ?   GCS: GCS eye subscore is 4. GCS verbal subscore is 1. GCS motor subscore is 5.  ?   Comments: Unable to follow commands, does not respond to verbal prompting  ?Psychiatric:     ?   Mood and Affect: Mood normal.  ? ? ?ED Results / Procedures / Treatments   ?Labs ?(all labs ordered are listed, but only abnormal results are displayed) ?Labs Reviewed  ?CULTURE, BLOOD (ROUTINE X 2)  ?CULTURE, BLOOD (ROUTINE X 2)  ?URINE CULTURE  ?LACTIC ACID, PLASMA  ?LACTIC ACID, PLASMA  ?COMPREHENSIVE METABOLIC PANEL  ?CBC WITH DIFFERENTIAL/PLATELET  ?PROTIME-INR  ?APTT  ?URINALYSIS, ROUTINE W REFLEX MICROSCOPIC   ?I-STAT CHEM 8, ED  ? ? ?EKG ?None ? ?Radiology ?No results found. ? ?Procedures ?Procedures  ? ? ?Medications Ordered in ED ?Medications - No data to display ? ?ED Course/ Medical Decision Making/ A&P ?Clinical Course as of 04/30/21 0016  ?Sat Apr 29, 2021  ?2346 After multiple attempts to contact patient's facility [facility] home or her POA/family member listed in the chart, I was able to successfully contact Mr. Loletha Grayer, her power of attorney.  He states at her baseline she is conversant and very pleasant though confused.  He states that the behavior she was exhibiting today is significantly removed from her baseline.  I appreciate his collaboration in the care of this patient. [RS]  ?2358 Consult to hospitalist, Dr. Posey Pronto, who is agreeable to admitting this patient with serial CBGs collaboration in care this patient. [RS]  ?  ?Clinical Course User Index ?[RS] Chava Dulac, Gypsy Balsam, PA-C  ? ?                        ?Medical Decision Making ?88 year old with dementia, pleasantly confused at baseline who presents with altered mental status, combative upon arrival to the emergency department today. ? ?Hypertensive and reportedly normothermic on intake.  According to RN documented temperature was rectal but there was a malfunction with the thermometer and oral temperature was confirmed to be 97.6 ?F.  Repeatrectal temperature pending. ? ? ?The differential diagnosis for AMS is extensive and includes, but is not limited to:  ?Drug overdose - opioids, alcohol, sedatives, antipsychotics, drug withdrawal, others ?Metabolic: hypoxia, hypoglycemia, hyperglycemia, hypercalcemia, hypernatremia, hyponatremia, uremia, hepatic encephalopathy, hypothyroidism, hyperthyroidism, vitamin B12 or thiamine deficiency, carbon monoxide poisoning, Wilson's disease, Lactic acidosis, DKA/HHOS ?Infectious: meningitis, encephalitis, bacteremia/sepsis, urinary tract infection, pneumonia, neurosyphilis ?Structural: Space-occupying  lesion, (brain tumor, subdural hematoma, hydrocephalus,) ?Vascular: stroke, subarachnoid hemorrhage, coronary ischemia, hypertensive encephalopathy, CNS vasculitis, thrombotic thrombocytopenic purpura, disseminated intravasc

## 2021-04-29 NOTE — ED Triage Notes (Signed)
Pt brought to ED by [facility] for possible AMS. EMS states that they did not receive report from facility, that "nurse just handed them papers and left", but state patient has hx of UTI and has a "strong urine smell". Pt presents displaying severe confusion, is oriented to self only.  ?

## 2021-04-29 NOTE — H&P (Signed)
?History and Physical  ? ? ?[name] ZOX:[id number] DOB: August 09, 1927 DOA: 04/29/2021 ? ?PCP: Pcp, No  ?Patient coming from: [facility] facility ? ?I have personally briefly reviewed patient's old medical records in [place] ? ?Chief Complaint: Altered mental status ? ?HPI: ?Kristina Wilkinson is a 88 y.o. female with medical history significant for dementia, chronic atrial fibrillation on Eliquis, HTN, diet-controlled T2DM, history of DVT, remote CNS aneurysm s/p clipping, history of CVA who presented to the ED from [facility] facility for evaluation of altered mental status.  Patient is unable to provide any history due to dementia and altered mental status and is otherwise supplemented by EDP, chart review, and patient's POA by phone. ? ?Recently admitted 03/28/2021-04/01/2021 for altered mental status thought secondary to dehydration.  Patient was treated empirically for UTI with antibiotics at that time as well.  CT head and EEG were negative. ? ?Per patient's cousin/POA, she has known dementia and is confused at baseline however is able to communicate fairly well with clear speech.  She does not have any difficulty with swallowing foods or liquids.  She is bedbound at baseline. ? ?Per EDP and ED documentation, patient was sent from her nursing facility for apparent altered mental status.  EMS reported that facility "nurse just handed them papers and left" without further history.  There was concern that patient's urine had a strong smell. ? ?At time of admission, patient is intermittently agitated when she is moved in bed for nursing care.  She moans intermittently and is otherwise not communicating clearly. ? ?ED Course  Labs/Imaging on admission: I have personally reviewed following labs and imaging studies. ? ?Initial vitals showed BP 156/73, pulse 77, RR 20, temp 94.8 ?F rectally, SPO2 95% on room air. ? ?Labs show WBC 6.2, hemoglobin 12.3, platelets 188,000, sodium 146, potassium 3.4,  bicarb 23, BUN 19, creatinine 0.62, serum glucose 113, LFTs within normal limits, lactic acid 1.3. ? ?Urinalysis negative for UTI.  Blood and urine cultures ordered and pending. ? ?Portable chest x-ray shows unchanged bibasilar atelectasis/scarring. ? ?The hospitalist service was consulted to admit for further evaluation and management. ? ?Review of Systems:  ?Unable to obtain full review of systems due to dementia and altered mental status. ? ? ?Past Medical History:  ?Diagnosis Date  ? Arthritis   ? CNS aneurysm 1983  ? s/p clipping  ? Diabetes mellitus type II   ? diet controlled   ? DVT (deep venous thrombosis) ([place]) 10/15  ? Elevated glucose   ? Hypertension   ? Insomnia   ? Knee pain   ? Left Dr. Meyer Cory  ? LBP (low back pain)   ? PN (peripheral neuropathy)   ? unknown etiol  ? ? ?Past Surgical History:  ?Procedure Laterality Date  ? CEREBRAL ANEURYSM REPAIR    ? initial repair in the 1980s, with second surgery in the 1990s  ? CHOLECYSTECTOMY    ? JOINT REPLACEMENT    ? KNEE ARTHROPLASTY  04/2009  ? Left  ? TOTAL KNEE ARTHROPLASTY    ? TUBAL LIGATION    ? ? ?Social History: ? reports that she has quit smoking. She has never used smokeless tobacco. She reports that she does not drink alcohol and does not use drugs. ? ?Allergies  ?Allergen Reactions  ? Gabapentin Itching and Other (See Comments)  ?  Made the legs "go numb" and "allergic," per [facility]  ? Other Other (See Comments)  ?  "MD mixed a blood  pressure medication with the Clinoril I was taking and I couldn't walk" "made me achy and sick"  ? Penicillins Other (See Comments) and Itching  ?  Has patient had a PCN reaction causing immediate rash, facial/tongue/throat swelling, SOB or lightheadedness with hypotension: Unk- exact reaction not recalled, but is allergic ?Has patient had a PCN reaction causing severe rash involving mucus membranes or skin necrosis: Unk ?Has patient had a PCN reaction that required hospitalization: Unk ?Has patient had a PCN reaction  occurring within the last 10 years: Yes ?If all of the above answers are "NO", then may proceed with Cephalosporin use. ? ?Itching and rash  ? Pregabalin Other (See Comments)  ?  "Made me pass out" and "made the limbs go numb"- "allergic," per [facility]  ? Vit D-Vit E-Safflower Oil Other (See Comments)  ?  "Made me achy and sick"  ? ? ?Family History  ?Problem Relation Age of Onset  ? Arthritis Mother   ? Stroke Mother   ? Stroke Father   ? Arthritis-Osteo Son   ? ? ? ?Prior to Admission medications   ?Medication Sig Start Date End Date Taking? Authorizing Provider  ?acetaminophen (TYLENOL) 325 MG tablet Take 2 tablets (650 mg total) by mouth every 6 (six) hours as needed for mild pain (or Fever >/= 101). 10/10/20  Yes Sheikh, Omair Latif, DO  ?apixaban (ELIQUIS) 5 MG TABS tablet Take 1 tablet (5 mg total) by mouth 2 (two) times daily. 10/10/20  Yes Sheikh, Omair Latif, DO  ?bisacodyl (DULCOLAX) 10 MG suppository Place 1 suppository (10 mg total) rectally daily as needed for moderate constipation. 10/10/20  Yes Sheikh, Omair Latif, DO  ?busPIRone (BUSPAR) 5 MG tablet Take 5 mg by mouth in the morning and at bedtime.   Yes [provider]  ?metoprolol succinate (TOPROL-XL) 25 MG 24 hr tablet Take 0.5 tablets (12.5 mg total) by mouth daily. 10/10/20  Yes Raiford Noble [place], DO  ?Multiple Vitamins-Minerals (DECUBI-VITE) CAPS Take 1 capsule by mouth daily. 04/13/21  Yes [provider]  ?polyethylene glycol (MIRALAX / GLYCOLAX) 17 g packet Take 17 g by mouth 2 (two) times daily. 10/10/20  Yes Sheikh, Omair Latif, DO  ?polyvinyl alcohol (LIQUIFILM TEARS) 1.4 % ophthalmic solution Place 1 drop into both eyes as needed for dry eyes. 10/10/20  Yes Sheikh, Omair Latif, DO  ?potassium chloride SA (KLOR-CON M) 20 MEQ tablet Take 40 mEq by mouth daily. 02/20/21  Yes [provider]  ?senna (SENOKOT) 8.6 MG TABS tablet Take 8.6 mg by mouth in the morning and at bedtime.   Yes [provider]  ?UNABLE TO FIND  Take 90 mLs by mouth 2 (two) times daily. Med Name: Medpass 2.0 , Nutritional supplement   Yes [provider]  ? ? ?Physical Exam: ?Vitals:  ? 04/29/21 2300 04/30/21 0000 04/30/21 0003 04/30/21 0015  ?BP: (!) 128/96 (!) 164/89  (!) 180/97  ?Pulse: (!) 56 79  96  ?Resp: 14 20  (!) 28  ?Temp:   97.6 ?F (36.4 ?C) 97.7 ?F (36.5 ?C)  ?TempSrc:   Oral Axillary  ?SpO2: 97% 97%  97%  ? ?Exam limited due to dementia and altered mental status. ?Constitutional: Chronically ill-appearing elderly woman resting supine in bed, minimally interactive, intermittently moaning ?Eyes: PERRL, lids and conjunctivae normal ?ENMT: Mucous membranes are dry. Posterior pharynx clear of any exudate or lesions. ?Neck: normal, supple, no masses. ?Respiratory: clear to auscultation anteriorly. ?Cardiovascular: Irregularly irregular, no murmurs / rubs / gallops. No  extremity edema.  ?Abdomen: no obvious tenderness to palpation, no masses palpated. No hepatosplenomegaly. ?Musculoskeletal: no clubbing / cyanosis. No joint deformity upper and lower extremities. Normal muscle tone.  ?Skin: Multiple skin tears on forearms and LLE ?Neurologic: Limited exam.  Moves all extremities spontaneously. ?Psychiatric: Awake but minimally interactive, moaning intermittently but not communicating otherwise ? ?EKG: Personally reviewed.  Atrial fibrillation, rate 72, motion artifact.  Similar to prior. ? ?Assessment/Plan ?Principal Problem: ?  Acute encephalopathy ?Active Problems: ?  Dementia without behavioral disturbance ([place]) ?  Constipation ?  Hypothermia ?  Atrial fibrillation, chronic ([place]) ?  HTN (hypertension) ?  ?Kristina Wilkinson is a 88 y.o. female with medical history significant for dementia, chronic atrial fibrillation on Eliquis, HTN, diet-controlled T2DM, history of DVT, remote CNS aneurysm s/p clipping, history of CVA who is admitted from her nursing facility for evaluation of altered mental status. ? ?Assessment and Plan: ?* Acute  encephalopathy ?Dementia ?Acute encephalopathy superimposed on background of dementia.  Reportedly verbally communicative at baseline, only moaning intermittently at time of admission.  Appears dehydrated with m

## 2021-04-30 ENCOUNTER — Observation Stay (HOSPITAL_COMMUNITY): Payer: Medicare Other

## 2021-04-30 DIAGNOSIS — Z86718 Personal history of other venous thrombosis and embolism: Secondary | ICD-10-CM | POA: Diagnosis not present

## 2021-04-30 DIAGNOSIS — G4700 Insomnia, unspecified: Secondary | ICD-10-CM | POA: Diagnosis present

## 2021-04-30 DIAGNOSIS — Z880 Allergy status to penicillin: Secondary | ICD-10-CM | POA: Diagnosis not present

## 2021-04-30 DIAGNOSIS — E119 Type 2 diabetes mellitus without complications: Secondary | ICD-10-CM | POA: Diagnosis present

## 2021-04-30 DIAGNOSIS — I482 Chronic atrial fibrillation, unspecified: Secondary | ICD-10-CM | POA: Diagnosis present

## 2021-04-30 DIAGNOSIS — M1990 Unspecified osteoarthritis, unspecified site: Secondary | ICD-10-CM | POA: Diagnosis present

## 2021-04-30 DIAGNOSIS — Z91018 Allergy to other foods: Secondary | ICD-10-CM | POA: Diagnosis not present

## 2021-04-30 DIAGNOSIS — Z7901 Long term (current) use of anticoagulants: Secondary | ICD-10-CM | POA: Diagnosis not present

## 2021-04-30 DIAGNOSIS — I10 Essential (primary) hypertension: Secondary | ICD-10-CM | POA: Diagnosis present

## 2021-04-30 DIAGNOSIS — G9340 Encephalopathy, unspecified: Secondary | ICD-10-CM | POA: Diagnosis present

## 2021-04-30 DIAGNOSIS — L89623 Pressure ulcer of left heel, stage 3: Secondary | ICD-10-CM | POA: Diagnosis present

## 2021-04-30 DIAGNOSIS — Z66 Do not resuscitate: Secondary | ICD-10-CM | POA: Diagnosis present

## 2021-04-30 DIAGNOSIS — E870 Hyperosmolality and hypernatremia: Secondary | ICD-10-CM | POA: Diagnosis present

## 2021-04-30 DIAGNOSIS — Z9049 Acquired absence of other specified parts of digestive tract: Secondary | ICD-10-CM | POA: Diagnosis not present

## 2021-04-30 DIAGNOSIS — G9349 Other encephalopathy: Secondary | ICD-10-CM | POA: Diagnosis present

## 2021-04-30 DIAGNOSIS — Z888 Allergy status to other drugs, medicaments and biological substances status: Secondary | ICD-10-CM | POA: Diagnosis not present

## 2021-04-30 DIAGNOSIS — F0390 Unspecified dementia without behavioral disturbance: Secondary | ICD-10-CM | POA: Diagnosis present

## 2021-04-30 DIAGNOSIS — L89613 Pressure ulcer of right heel, stage 3: Secondary | ICD-10-CM | POA: Diagnosis present

## 2021-04-30 DIAGNOSIS — Z79899 Other long term (current) drug therapy: Secondary | ICD-10-CM | POA: Diagnosis not present

## 2021-04-30 DIAGNOSIS — K5900 Constipation, unspecified: Secondary | ICD-10-CM | POA: Diagnosis present

## 2021-04-30 DIAGNOSIS — Z7401 Bed confinement status: Secondary | ICD-10-CM | POA: Diagnosis not present

## 2021-04-30 DIAGNOSIS — E860 Dehydration: Secondary | ICD-10-CM | POA: Diagnosis present

## 2021-04-30 DIAGNOSIS — Z96652 Presence of left artificial knee joint: Secondary | ICD-10-CM | POA: Diagnosis present

## 2021-04-30 DIAGNOSIS — Z8673 Personal history of transient ischemic attack (TIA), and cerebral infarction without residual deficits: Secondary | ICD-10-CM | POA: Diagnosis not present

## 2021-04-30 DIAGNOSIS — Z87891 Personal history of nicotine dependence: Secondary | ICD-10-CM | POA: Diagnosis not present

## 2021-04-30 LAB — BASIC METABOLIC PANEL
Anion gap: 8 (ref 5–15)
BUN: 21 mg/dL (ref 8–23)
CO2: 25 mmol/L (ref 22–32)
Calcium: 9.7 mg/dL (ref 8.9–10.3)
Chloride: 114 mmol/L — ABNORMAL HIGH (ref 98–111)
Creatinine, Ser: 0.78 mg/dL (ref 0.44–1.00)
GFR, Estimated: >60 mL/min (ref >60)
Glucose, Bld: 122 mg/dL — ABNORMAL HIGH (ref 70–99)
Potassium: 4 mmol/L (ref 3.5–5.1)
Sodium: 147 mmol/L — ABNORMAL HIGH (ref 135–145)

## 2021-04-30 LAB — CBC
HCT: 36.2 % (ref 36.0–46.0)
Hemoglobin: 11.7 g/dL — ABNORMAL LOW (ref 12.0–15.0)
MCH: 28.7 pg (ref 26.0–34.0)
MCHC: 32.3 g/dL (ref 30.0–36.0)
MCV: 88.9 fL (ref 80.0–100.0)
Platelets: 189 10*3/uL (ref 150–400)
RBC: 4.07 MIL/uL (ref 3.87–5.11)
RDW: 15 % (ref 11.5–15.5)
WBC: 7.3 10*3/uL (ref 4.0–10.5)
nRBC: 0 % (ref 0.0–0.2)

## 2021-04-30 LAB — MRSA NEXT GEN BY PCR, NASAL: MRSA by PCR Next Gen: NOT DETECTED

## 2021-04-30 MED ORDER — ACETAMINOPHEN 650 MG RE SUPP: 650.0000 mg | Freq: Four times a day (QID) | RECTAL | Status: DC | PRN

## 2021-04-30 MED ORDER — POLYETHYLENE GLYCOL 3350 17 G PO PACK: 17.0000 g | PACK | Freq: Every day | ORAL | Status: DC | PRN

## 2021-04-30 MED ORDER — ACETAMINOPHEN 325 MG PO TABS: 650.0000 mg | ORAL_TABLET | Freq: Four times a day (QID) | ORAL | Status: DC | PRN

## 2021-04-30 MED ORDER — ONDANSETRON HCL 4 MG PO TABS: 4.0000 mg | ORAL_TABLET | Freq: Four times a day (QID) | ORAL | Status: DC | PRN

## 2021-04-30 MED ORDER — KCL IN DEXTROSE-NACL 40-5-0.9 MEQ/L-%-% IV SOLN
INTRAVENOUS | Status: AC
  Filled 2021-04-30 (×2): qty 1000

## 2021-04-30 MED ORDER — FLEET ENEMA 7-19 GM/118ML RE ENEM
1.0000 | ENEMA | Freq: Once | RECTAL | Status: DC | PRN
Start: 2021-04-30 — End: 2021-05-02
  Filled 2021-04-30: qty 1

## 2021-04-30 MED ORDER — SODIUM CHLORIDE 0.9% FLUSH
3.0000 mL | Freq: Two times a day (BID) | INTRAVENOUS | Status: DC
  Administered 2021-04-30 – 2021-05-01 (×2): 3 mL via INTRAVENOUS

## 2021-04-30 MED ORDER — SENNA 8.6 MG PO TABS
1.0000 | ORAL_TABLET | Freq: Two times a day (BID) | ORAL | Status: DC
  Administered 2021-05-01: 8.6 mg via ORAL
  Filled 2021-04-30 (×2): qty 1

## 2021-04-30 MED ORDER — APIXABAN 5 MG PO TABS
5.0000 mg | ORAL_TABLET | Freq: Two times a day (BID) | ORAL | Status: DC
  Administered 2021-05-01 (×2): 5 mg via ORAL
  Filled 2021-04-30 (×3): qty 1

## 2021-04-30 MED ORDER — DEXTROSE 5 % IV SOLN: INTRAVENOUS | Status: DC

## 2021-04-30 MED ORDER — ONDANSETRON HCL 4 MG/2ML IJ SOLN
4.0000 mg | Freq: Four times a day (QID) | INTRAMUSCULAR | Status: DC | PRN
Start: 2021-04-30 — End: 2021-05-02

## 2021-04-30 MED ORDER — METOPROLOL SUCCINATE ER 25 MG PO TB24
12.5000 mg | ORAL_TABLET | Freq: Every day | ORAL | Status: DC
  Administered 2021-05-01 – 2021-05-02 (×2): 12.5 mg via ORAL
  Filled 2021-04-30 (×3): qty 1

## 2021-04-30 MED ORDER — BISACODYL 10 MG RE SUPP
10.0000 mg | Freq: Every day | RECTAL | Status: DC | PRN
  Administered 2021-04-30: 10 mg via RECTAL
  Filled 2021-04-30: qty 1

## 2021-04-30 MED ORDER — POLYETHYLENE GLYCOL 3350 17 G PO PACK
17.0000 g | PACK | Freq: Two times a day (BID) | ORAL | Status: DC
  Administered 2021-05-01: 17 g via ORAL
  Filled 2021-04-30: qty 1

## 2021-04-30 MED ORDER — BUSPIRONE HCL 10 MG PO TABS
5.0000 mg | ORAL_TABLET | Freq: Two times a day (BID) | ORAL | Status: DC
  Administered 2021-05-01 – 2021-05-02 (×3): 5 mg via ORAL
  Filled 2021-04-30 (×4): qty 1

## 2021-04-30 NOTE — Hospital Course (Signed)
Kristina Wilkinson is a 88 y.o. female with medical history significant for dementia, chronic atrial fibrillation on Eliquis, HTN, diet-controlled T2DM, history of DVT, remote CNS aneurysm s/p clipping, history of CVA who is admitted from her nursing facility for evaluation of altered mental status. ?

## 2021-04-30 NOTE — ED Notes (Signed)
Pt had left arm contracted upon arriving in room. RN assessed left arm and noticed pt IV had infiltrated. RN removed IV and compressed site.  ? ?IV team consulted to place a new IV. ?

## 2021-04-30 NOTE — Progress Notes (Signed)
Per Dr Kurtis Bushman, start D5 fluids at rate of 92m/hr ?

## 2021-04-30 NOTE — Assessment & Plan Note (Signed)
Remains in atrial fibrillation on admission with controlled rate.  Resume home Toprol-XL and Eliquis. ?

## 2021-04-30 NOTE — Assessment & Plan Note (Addendum)
Dementia ?Acute encephalopathy superimposed on background of dementia.  Reportedly verbally communicative at baseline, only moaning intermittently at time of admission.  Appears dehydrated with mild hyponatremia and encephalopathy possibly also exacerbated by constipation.  Urinalysis is negative for UTI.  CXR negative for evidence of pneumonia.  Blood culture in process. ?-Start on IV fluids with D5-NS'@100'$  mL/hour with KCl 40 mEq overnight ?-Place on delirium and fall precautions ? ?EDIT: hyponatremia documented in error above, should read hypernatremia. ?

## 2021-04-30 NOTE — ED Notes (Signed)
RN cleaned pt face. ?

## 2021-04-30 NOTE — ED Notes (Signed)
This RN notified provider that original rectal temperature may not have been accurate reading, temp probe covered in large amount of stool each time rectal temp obtained and thermometer took prolonged amount of time to read. Pt presents with both oral and axillary temp >97.52f  This RN will continue to monitor temperature for signs of hypothermia.  ?

## 2021-04-30 NOTE — Care Management Obs Status (Signed)
MEDICARE OBSERVATION STATUS NOTIFICATION ? ? ?Patient Details  ?Name: Kristina Wilkinson ?MRN: [id number] ?Date of Birth: Feb 18, 1927 ? ? ?Medicare Observation Status Notification Given:  Yes ? ?Patient is altered, notice given to [place] and verbal consent was given to sign. Copy placed on patient instructions to be printed upon DC with AVS ? ?Verdell Carmine, RN ?04/30/2021, 9:38 AM ?

## 2021-04-30 NOTE — Assessment & Plan Note (Signed)
Continue Toprol-XL 12.5 mg daily. ?

## 2021-04-30 NOTE — Assessment & Plan Note (Addendum)
Rectal temp on ED arrival was 94.8 ?F however this was felt to be inaccurate due to contamination from rectal stool.  Repeat oral and axillary temperatures have been >97 ?F.  Will avoid bair hugger at this time.  Continue to monitor. ?

## 2021-04-30 NOTE — ED Notes (Signed)
NT and RN provided pericare and changed pt brief. ? ?Pt had small amount of liquid stool in brief. ?

## 2021-04-30 NOTE — Assessment & Plan Note (Addendum)
Likely contributing to presentation.  Large stool ball in the rectum was seen on last admission.  Will start on bowel regimen with scheduled Senokot with MiraLAX, Dulcolax suppository, and Fleet enema as needed.  Check KUB.  Continue IV fluids as above. ?

## 2021-04-30 NOTE — Discharge Instructions (Signed)
Medicare Outpatient Observation Notice ?  ?Patient name:  Kristina Wilkinson Patient number:  [id number]  ?                                                                                                                                                                     ?You?re a hospital outpatient receiving observation services. You are not an inpatient because: ? ?  ?Altered Mental Status ?  ?You require hospital care for evaluation and/or treatment.  It is expected you will need hospital care for less than a total of two days.  ?                                                                                                                                                                     ?  ?Being an outpatient may affect what you pay in a hospital: ?  ?When you?re a hospital outpatient, your observation stay is covered under Medicare Part B. ?  ?For Part B services, you generally pay: ?  ?A copayment for each outpatient hospital service you get. Part B copayments may vary by type of service. ?  ?20% of the Medicare-approved amount for most doctor services, after the Part B deductible. ?  ?Observation services may affect coverage and payment of your care after you leave the hospital: ? ?  ? ?If you need skilled nursing facility (SNF) care after you leave the hospital, Medicare Part A will only cover SNF care if you?ve had a 3-day minimum, medically necessary, inpatient hospital stay for a related illness or injury. An inpatient hospital stay begins the day the hospital admits you as an inpatient based on a doctor?s order and doesn?t include the day you?re discharged. ?  ?If you have Medicaid, a Medicare Advantage plan or other health plan, Medicaid or the plan may have different rules for SNF coverage after you leave the hospital. Check with Medicaid or your plan. ?  ?NOTE: Medicare Part A generally doesn?t cover outpatient hospital  services, like an observation stay. However, Part A will generally cover  medically necessary inpatient services if the hospital admits you as an inpatient based on a doctor?s order. In most cases, you?ll pay a one-time deductible for all of your inpatient hospital services for the first 60 days you?re in a hospital. ?                                                                                                                                                                     ?If you have any questions about your observation services, ask the hospital staff member giving you this notice or the doctor providing your hospital care. You can also ask to speak with someone from the hospital?s utilization or discharge planning department. ?  ?You can also call 1-800-MEDICARE ([phone] 783-4335).  TT[employer] users should call [phone]. ?  ?Form CMS [id number]   Expiration 02/11/2021 OMB APPROVAL [id number]  ?  ?  ? ?  ? ?Your costs for medications: ? ?  ? ?Generally, prescription and over-the-counter drugs, including ?self-administered drugs,? you get in a hospital outpatient setting (like an emergency department) aren?t covered by Part B. ?Self- administered drugs? are drugs you?d normally take on your own. For safety reasons, many hospitals don?t allow you to take medications brought from home. If you have a Medicare prescription drug plan (Part D), your plan may help you pay for these drugs. You?ll likely need to pay out-of- pocket for these drugs and submit a claim to your drug plan for a refund. Contact your drug plan for more information. ?  ?                                                                                                                                                                     ?If you?re enrolled in a Medicare Advantage plan (like an HMO or PPO) or other Medicare health plan (Part C), your costs and coverage may be different. Check with your plan to find out about  coverage for outpatient observation services. ? ?  ?If you?re a Chief of Staff  through your state Medicaid program, you can?t be billed for Part A or Part B deductibles, coinsurance, and copayments. ? ?                                                                                                                                                                    ?Additional Information (Optional): ?  ?  ?  ?  ?  ?                                                                                                                                                                     ?Please sign below to show you received and understand this notice. ? ?  ? ?                                    Date: 04/30/21 / Time:9:37 AM ?  ?CMS does not discriminate in its programs and activities. To request this publication in alternative format, please call: 1-800-MEDICARE or email:[email or web address]. ?  ?Form CMS [id number]   Expiration 02/11/2021 OMB APPROVAL [id number]  ?  ? ? ?Patient ? ? ?Add ?No image attached ?Trace ?Slow ?Corrupt ?Edit Data ?Change Template ?Print ?On  ? ?

## 2021-04-30 NOTE — Progress Notes (Signed)
?PROGRESS NOTE ? ? ? ?Kristina Wilkinson  XHB:[id number] DOB: Aug 06, 1927 DOA: 04/29/2021 ?PCP: Pcp, No  ? ? ?Brief Narrative:  ?Kristina Wilkinson is a 88 y.o. female with medical history significant for dementia, chronic atrial fibrillation on Eliquis, HTN, diet-controlled T2DM, history of DVT, remote CNS aneurysm s/p clipping, history of CVA who presented to the ED from [facility] facility for evaluation of altered mental status.  Patient is unable to provide any history due to dementia and altered mental status and is otherwise supplemented by EDP, chart review, and patient's POA by phone. ? ?Recently admitted 03/28/2021-04/01/2021 for altered mental status thought secondary to dehydration.  Patient was treated empirically for UTI with antibiotics at that time as well.  CT head and EEG were negative. ? ?Per patient's cousin/POA, she has known dementia and is confused at baseline however is able to communicate fairly well with clear speech.  She does not have any difficulty with swallowing foods or liquids.  She is bedbound at baseline. ? ?Per EDP and ED documentation, patient was sent from her nursing facility for apparent altered mental status.  EMS reported that facility "nurse just handed them papers and left" without further history.  There was concern that patient's urine had a strong smell ? ?Consultants:  ? ? ?Procedures:  ? ?Antimicrobials:  ?cefepime  ? ? ?Subjective: ?Pt confused this am. Doesn't even open her eyes for me. When I touched her leg she moaned. ? ?Objective: ?Vitals:  ? 04/30/21 1145 04/30/21 1200 04/30/21 1230 04/30/21 1431  ?BP: 121/62 121/63 136/69 (!) 152/74  ?Pulse: 89 91 97 90  ?Resp: 20 19 (!) 23 19  ?Temp:    98.4 ?F (36.9 ?C)  ?TempSrc:    Axillary  ?SpO2: 92% 92% 93% 92%  ? ? ?Intake/Output Summary (Last 24 hours) at 04/30/2021 1434 ?Last data filed at 04/30/2021 1254 ?Gross per 24 hour  ?Intake 1100 ml  ?Output --  ?Net 1100 ml  ? ?There were no vitals filed for this  visit. ? ?Examination: ?Calm, NAD ?Cta no w/r ?Reg s1/s2 no gallop ?Soft benign +bs ?No edema ?Confused, unable to assess ? ? ? ? ?Data Reviewed: I have personally reviewed following labs and imaging studies ? ?CBC: ?Recent Labs  ?Lab 04/29/21 ?2111 04/29/21 ?2123 04/30/21 ?0120  ?WBC 6.2  --  7.3  ?NEUTROABS 4.2  --   --   ?HGB 12.3 12.2 11.7*  ?HCT 38.3 36.0 36.2  ?MCV 89.9  --  88.9  ?PLT 188  --  189  ? ?Basic Metabolic Panel: ?Recent Labs  ?Lab 04/29/21 ?2111 04/29/21 ?2123 04/30/21 ?0120  ?NA 146* 146* 147*  ?K 3.4* 3.4* 4.0  ?CL 112* 113* 114*  ?CO2 23  --  25  ?GLUCOSE 113* 111* 122*  ?BUN '19 21 21  '$ ?CREATININE 0.62 0.60 0.78  ?CALCIUM 9.8  --  9.7  ? ?GFR: ?CrCl cannot be calculated (Unknown ideal weight.). ?Liver Function Tests: ?Recent Labs  ?Lab 04/29/21 ?2111  ?AST 25  ?ALT 24  ?ALKPHOS 105  ?BILITOT 0.4  ?PROT 6.0*  ?ALBUMIN 3.0*  ? ?No results for input(s): LIPASE, AMYLASE in the last 168 hours. ?No results for input(s): AMMONIA in the last 168 hours. ?Coagulation Profile: ?Recent Labs  ?Lab 04/29/21 ?2111  ?INR 1.1  ? ?Cardiac Enzymes: ?No results for input(s): CKTOTAL, CKMB, CKMBINDEX, TROPONINI in the last 168 hours. ?BNP (last 3 results) ?No results for input(s): PROBNP in the last 8760 hours. ?HbA1C: ?No results for input(s):  HGBA1C in the last 72 hours. ?CBG: ?No results for input(s): GLUCAP in the last 168 hours. ?Lipid Profile: ?No results for input(s): CHOL, HDL, LDLCALC, TRIG, CHOLHDL, LDLDIRECT in the last 72 hours. ?Thyroid Function Tests: ?No results for input(s): TSH, T4TOTAL, FREET4, T3FREE, THYROIDAB in the last 72 hours. ?Anemia Panel: ?No results for input(s): VITAMINB12, FOLATE, FERRITIN, TIBC, IRON, RETICCTPCT in the last 72 hours. ?Sepsis Labs: ?Recent Labs  ?Lab 04/29/21 ?2111  ?LATICACIDVEN 1.3  ? ? ?Recent Results (from the past 240 hour(s))  ?Blood Culture (routine x 2)     Status: None (Preliminary result)  ? Collection Time: 04/29/21  9:12 PM  ? Specimen: BLOOD RIGHT HAND   ?Result Value Ref Range Status  ? Specimen Description BLOOD RIGHT HAND  Final  ? Special Requests   Final  ?  BOTTLES DRAWN AEROBIC AND ANAEROBIC Blood Culture adequate volume  ? Culture   Final  ?  NO GROWTH < 12 HOURS ?Performed at [facility], [place] [address], [place], [place] [address] ?  ? Report Status PENDING  Incomplete  ?  ? ? ? ? ? ?Radiology Studies: ?DG Chest 1 View ? ?Result Date: 04/29/2021 ?CLINICAL DATA:  Possible sepsis. EXAM: CHEST  1 VIEW COMPARISON:  03/28/2021 and prior radiographs FINDINGS: This is a low volume study. Continued bibasilar atelectasis/scarring again noted. Cardiomediastinal silhouette is unchanged. There is no evidence of focal airspace disease, pulmonary edema, suspicious pulmonary nodule/mass, pleural effusion, or pneumothorax. No acute bony abnormalities are identified. IMPRESSION: Low volume study with unchanged bibasilar atelectasis/scarring. Electronically Signed   By: Margarette Canada M.D.   On: 04/29/2021 20:52  ? ?Abd 1 View (KUB) ? ?Result Date: 04/30/2021 ?CLINICAL DATA:  Constipation. EXAM: ABDOMEN - 1 VIEW COMPARISON:  None. FINDINGS: The bowel gas pattern is normal. There is a large amount of stool in the rectum. Otherwise there is average stool burden. No radio-opaque calculi or other significant radiographic abnormality are seen. There is a surgical clip in the right quadrant. Degenerative changes affect the spine. IMPRESSION: 1. Nonobstructive bowel gas pattern. 2. Large amount of stool the rectum. Electronically Signed   By: Ronney Asters M.D.   On: 04/30/2021 01:02   ? ? ? ? ? ?Scheduled Meds: ? apixaban  5 mg Oral BID  ? busPIRone  5 mg Oral BID  ? metoprolol succinate  12.5 mg Oral Daily  ? senna  1 tablet Oral BID  ? sodium chloride flush  3 mL Intravenous Q12H  ? ?Continuous Infusions: ? dextrose    ? ? ?Assessment & Plan: ?  ?Principal Problem: ?  Acute encephalopathy ?Active Problems: ?  Dementia without behavioral disturbance ([place]) ?  Constipation ?   Hypothermia ?  Atrial fibrillation, chronic ([place]) ?  HTN (hypertension) ? ? ?Acute encephalopathy ?Dementia ?Likely due to dehydration.mildly hypernatremic ?Will start ivf with d5w for hydration ?Placed on delirium and fall precautions ?Abdominal x-ray with stool ?Chest x-ray negative for pneumonia, UA negative ?  ?Constipation ?Continue bowel regimen ? ?Hypernatremia ?Mild ?Likely due to dehydration ?Start IV fluids as above ? ? ?Atrial fibrillation, chronic ([place]) ?Remains in atrial fibrillation on admission with controlled rate.   ?Continue Toprol-XL and Eliquis  ? ? ? ? ? ?  ?Hypothermia ?Rectal temp on ED arrival was 94.8 ?F however this was felt to be inaccurate due to contamination from rectal stool.  Repeat oral and axillary temperatures have been >97 ?F.  Will avoid bair hugger at this time.  Continue  to monitor. ?  ? ?  ?HTN (hypertension) ?Continue Toprol-XL 12.5 mg daily ? ? ?DVT prophylaxis: eliquis ?Code Status:DNR ?Family Communication: called left vm for poa ?Disposition Plan:  ?Status is: Observation ?The patient will require care spanning > 2 midnights and should be moved to inpatient because: iv treatment, still confused, constipated ?  ? ? ? ? ? LOS: 0 days  ? ?Time spent: 35 minutes with more than 50% on COC ? ? ? ?Nolberto Hanlon, MD ?[facility] ?Pager [phone] ? ?If 7PM-7AM, please contact night-coverage ?04/30/2021, 2:34 PM   ?

## 2021-04-30 NOTE — ED Notes (Signed)
Pt noted to be agitated, continually removing blankets that have been applied by this RN.  ?

## 2021-04-30 NOTE — Progress Notes (Signed)
Soft wrist restraint removed from patient upon arrival to unit , skin tear located under wrist band, Dr Kurtis Bushman advised, I have placed foam for comfort and ordered wound consult for several wounds present on admission from [facility] ?

## 2021-04-30 NOTE — ED Notes (Addendum)
While walking pass room RN heard pt have a congested  cough. RN suctioned pt and notified RT.  ? ?Pt looked at RN when name was called and pt coughed when RN asked. This the first time today pt has been able to follow/command and respond to yes/no questions.  ? ? ?RN notified provider of changes and attached IP RN to chat. ?

## 2021-05-01 ENCOUNTER — Ambulatory Visit: Payer: Medicare Other | Admitting: Orthopedic Surgery

## 2021-05-01 ENCOUNTER — Encounter (HOSPITAL_COMMUNITY): Payer: Self-pay | Admitting: Urology

## 2021-05-01 DIAGNOSIS — G9340 Encephalopathy, unspecified: Secondary | ICD-10-CM | POA: Diagnosis not present

## 2021-05-01 LAB — SODIUM: Sodium: 145 mmol/L (ref 135–145)

## 2021-05-01 LAB — URINE CULTURE: Culture: 40000 — AB

## 2021-05-01 MED ORDER — MEDIHONEY WOUND/BURN DRESSING EX PSTE
1.0000 "application " | PASTE | Freq: Every day | CUTANEOUS | Status: DC
  Administered 2021-05-01 – 2021-05-02 (×2): 1 via TOPICAL
  Filled 2021-05-01: qty 44

## 2021-05-01 MED ORDER — HYDRALAZINE HCL 20 MG/ML IJ SOLN: 5.0000 mg | Freq: Four times a day (QID) | INTRAMUSCULAR | Status: DC | PRN

## 2021-05-01 NOTE — Consult Note (Signed)
WOC Nurse Consult Note: ?Reason for Consult:Bilateral heels with pressure injuries, right forearm with traumatic injury, skin tear (avulsion) ?Wound type: Pressure, trauma ?Pressure Injury POA: Yes ?Measurement: ?Right forearm: 4cm x 2cm x 0.2cm skin tear with red, moist wound bed, scant serous exudate. ?Right medial heel with Unstageable pressure injury measuring 3cm x 2cm with yellow slough in center of wound bed. Wound exudate with odor consistent with autolytically debriding nonviable tissue, no evidence of infection. ?Left lateral heel with Stage 3 pressure injury measuring 2cm x 1cm x 0.2cm with small amount of nonviable tissue in wound bed, but it does not obscure wound base. Serous exudate in a small amount. No odor. ?Left forearm with protective dressing, so skin loss. ?Wound bed:As noted above ?Drainage (amount, consistency, odor) As noted above ?Periwound: dry, fragile ?Dressing procedure/placement/frequency: I have provided Nursing with guidance for the care of these wounds. Daily application of an antimicrobial nonadherent (xeroform) is ordered for the right forearm skin tear and secured with Kerlix roll gauze/paper tape.  A protective silicone foam dressing is to be placed over the left forearm and the sacrum and these can be changed every 3 days and PRN soiling. The bilateral heels will be dressed with medical grade Manuka honey (MediHoney) daily after soap and water cleanse and topped with a dry dressing. This will be secured with Kerlix roll gauze and paper tape. The heel boots from her SNF are inferior to those provided here in house and I will provide bilateral Pressure redistribution heel boots (Prevalon) while here that she can take home with her post discharge.  ?Turning and repositioning is in place. ? ?[facility] nursing team will not follow, but will remain available to this patient, the nursing and medical teams.  Please re-consult if needed. ?Thanks, ?Maudie Flakes, MSN, RN, [place], [place],  [facility]ON-AP, [place]  ?Pager# [phone]  ? ? ? ?  ?

## 2021-05-01 NOTE — Plan of Care (Signed)
?  Problem: Safety: ?Goal: Non-violent Restraint(s) ?Outcome: Not Applicable ?  ?Problem: Education: ?Goal: Knowledge of General Education information will improve ?Description: Including pain rating scale, medication(s)/side effects and non-pharmacologic comfort measures ?Outcome: Progressing ?  ?Problem: Clinical Measurements: ?Goal: Ability to maintain clinical measurements within normal limits will improve ?Outcome: Progressing ?Goal: Will remain free from infection ?Outcome: Progressing ?Goal: Diagnostic test results will improve ?Outcome: Progressing ?Goal: Respiratory complications will improve ?Outcome: Progressing ?  ?

## 2021-05-01 NOTE — Progress Notes (Incomplete)
Initial Nutrition Assessment ? ?DOCUMENTATION CODES:  ? ?  ? ?INTERVENTION:  ?*** ? ? ?NUTRITION DIAGNOSIS:  ? ?  related to   as evidenced by  . ? ?GOAL:  ? ?  ? ?MONITOR:  ? ?  ? ?REASON FOR ASSESSMENT:  ? ?Malnutrition Screening Tool ?  ? ?ASSESSMENT:  ? ?88 year old female who presented to the ED on 3/18 with AMS. PMH of dementia, chronic atrial fibrillation, HTN, T2DM, DVT, remote CNS aneurysm s/p clipping, CVA. Pt admitted with acute encephalopathy. ? ?03/20 - diet advanced to dysphagia 3 with thin liquids ? ?Meal Completion: ***% ? ?Medications reviewed and include: miralax, senna ?IVF: D5 @ 50 ml/hr ? ?Labs reviewed: *** ? ?NUTRITION - FOCUSED PHYSICAL EXAM: ? ?{RD Focused Exam List:21252} ? ?Diet Order:   ?Diet Order   ? ?       ?  DIET DYS 3 Room service appropriate? Yes; Fluid consistency: Thin  Diet effective now       ?  ? ?  ?  ? ?  ? ? ?EDUCATION NEEDS:  ? ?  ? ?Skin:    ? ?Last BM:    ? ?Height:  ? ?Ht Readings from Last 1 Encounters:  ?03/29/21 '5\' 6"'$  (1.676 m)  ? ? ?Weight:  ? ?Wt Readings from Last 1 Encounters:  ?04/01/21 65.2 kg  ? ? ?Ideal Body Weight:    ? ?BMI:  There is no height or weight on file to calculate BMI. ? ?Estimated Nutritional Needs:  ? ?Kcal:    ? ?Protein:    ? ?Fluid:    ? ? ? ?Gustavus Bryant, MS, RD, LDN ?Inpatient Clinical Dietitian ?Please see AMiON for contact information. ? ?

## 2021-05-01 NOTE — Plan of Care (Signed)
  Problem: Clinical Measurements: Goal: Will remain free from infection Outcome: Progressing Goal: Diagnostic test results will improve Outcome: Progressing Goal: Respiratory complications will improve Outcome: Progressing Goal: Cardiovascular complication will be avoided Outcome: Progressing   

## 2021-05-01 NOTE — Social Work (Signed)
CSW is aware that pt has TOC needs and came from [facility]. CSW will follow up with pt after reaching out to facility for confirmation.  ?

## 2021-05-01 NOTE — Evaluation (Signed)
Clinical/Bedside Swallow Evaluation ?Patient Details  ?Name: Kristina Wilkinson ?MRN: [id number] ?Date of Birth: 09-09-1927 ? ?Today's Date: 05/01/2021 ?Time: SLP Start Time (ACUTE ONLY): 1320 SLP Stop Time (ACUTE ONLY): 1340 ?SLP Time Calculation (min) (ACUTE ONLY): 20 min ? ?Past Medical History:  ?Past Medical History:  ?Diagnosis Date  ? Arthritis   ? CNS aneurysm 1983  ? s/p clipping  ? Diabetes mellitus type II   ? diet controlled   ? DVT (deep venous thrombosis) ([place]) 10/15  ? Elevated glucose   ? Hypertension   ? Insomnia   ? Knee pain   ? Left Dr. Meyer Cory  ? LBP (low back pain)   ? PN (peripheral neuropathy)   ? unknown etiol  ? ?Past Surgical History:  ?Past Surgical History:  ?Procedure Laterality Date  ? CEREBRAL ANEURYSM REPAIR    ? initial repair in the 1980s, with second surgery in the 1990s  ? CHOLECYSTECTOMY    ? JOINT REPLACEMENT    ? KNEE ARTHROPLASTY  04/2009  ? Left  ? TOTAL KNEE ARTHROPLASTY    ? TUBAL LIGATION    ? ?HPI:  ?Pt is a 88 year-old female presenting from SNF with AMS. Recently admitted 03/28/2021-04/01/2021 for altered mental status thought secondary to dehydration.  Patient was treated empirically for UTI with antibiotics at that time as well. PMH includes: DM2, GERD, CNS aneurysm s/p clipping, DVT, HTN  ?  ?Assessment / Plan / Recommendation  ?Clinical Impression ? Pt was seen for bedside swallow evaluation. Pt asleep upon SLP entry into room, however awakened to hearing her name called and light sternal rubbing. SLP attempted to reposition pt upright in bed, but pt refused and was received in R sideline reclined position. RN reported that the pt did not experience difficulties taking pills with thin liquid sips of water. SLP administered individual straw sips of thin liquid to pt. Pt accepted ~2 sips before refusing further PO trials. No overt s/s aspiration with thin liquid trials observed. Pt refused any further POs (i.e., applesauce, graham cracker, ice cream). Pt verbalized she wanted  to be left alone. SLP recommends initiating Dys 3 diet with thin liquids at this time. In future sessions, SLP to follow at a mealtime to observe further PO intake and diet toleration for any further concerns or overt s/s of aspiration. Pt requires full assist/supervision at a mealtime for safety. SLP to continue to follow and possibly upgrade pt to regular diet. Pt may be at high risk for inadequate nutrition/hydration. ?SLP Visit Diagnosis: Dysphagia, unspecified (R13.10) ?   ?Aspiration Risk ? Mild aspiration risk  ?  ?Diet Recommendation Dysphagia 3 (Mech soft);Thin liquid  ? ?Liquid Administration via: Straw ?Medication Administration: Whole meds with liquid ?Supervision: Full supervision/cueing for compensatory strategies ?Compensations: Minimize environmental distractions;Slow rate;Small sips/bites ?Postural Changes: Seated upright at 90 degrees  ?  ?Other  Recommendations Oral Care Recommendations: Oral care BID   ? ?Recommendations for follow up therapy are one component of a multi-disciplinary discharge planning process, led by the attending physician.  Recommendations may be updated based on patient status, additional functional criteria and insurance authorization. ? ?Follow up Recommendations Skilled nursing-short term rehab (<3 hours/day)  ? ? ?  ?Assistance Recommended at Discharge Frequent or constant Supervision/Assistance  ?Functional Status Assessment Patient has had a recent decline in their functional status and demonstrates the ability to make significant improvements in function in a reasonable and predictable amount of time.  ?Frequency and Duration min 2x/week  ?2 weeks ?  ?   ? ?  Prognosis Prognosis for Safe Diet Advancement: Fair ?Barriers to Reach Goals: Cognitive deficits;Motivation;Severity of deficits  ? ?  ? ?Swallow Study   ?General HPI: Pt is a 88 year-old female presenting from SNF with AMS. Recently admitted 03/28/2021-04/01/2021 for altered mental status thought secondary to  dehydration.  Patient was treated empirically for UTI with antibiotics at that time as well. PMH includes: DM2, GERD, CNS aneurysm s/p clipping, DVT, HTN ?Type of Study: Bedside Swallow Evaluation ?Previous Swallow Assessment: BSE Feb 2023 ?Diet Prior to this Study: Regular;Thin liquids ?Temperature Spikes Noted: No ?Respiratory Status: Room air ?History of Recent Intubation: No ?Behavior/Cognition: Alert;Requires cueing;Doesn't follow directions ?Oral Cavity Assessment: Within Functional Limits ?Oral Care Completed by SLP: No ?Oral Cavity - Dentition: Missing dentition ?Vision: Functional for self-feeding ?Self-Feeding Abilities: Needs assist ?Patient Positioning: Other (comment) (Sideline in Bed) ?Baseline Vocal Quality: Normal  ?  ?Oral/Motor/Sensory Function Overall Oral Motor/Sensory Function: Within functional limits   ?Ice Chips Ice chips: Not tested   ?Thin Liquid Thin Liquid: Within functional limits ?Presentation: Straw  ?  ?Nectar Thick Nectar Thick Liquid: Not tested   ?Honey Thick Honey Thick Liquid: Not tested   ?Puree Puree: Not tested   ?Solid ? ? ?  Solid: Not tested  ? ?  ? ?Vaughan Sine ?05/01/2021,1:51 PM ? ? ? ?

## 2021-05-01 NOTE — Progress Notes (Signed)
?PROGRESS NOTE ? ? ? ?Kristina Wilkinson  HDQ:[id number] DOB: 1927/12/24 DOA: 04/29/2021 ?PCP: Pcp, No  ? ? ?Brief Narrative:  ?Kristina Wilkinson is a 88 y.o. female with medical history significant for dementia, chronic atrial fibrillation on Eliquis, HTN, diet-controlled T2DM, history of DVT, remote CNS aneurysm s/p clipping, history of CVA who presented to the ED from [facility] for evaluation of altered mental status.  Patient is unable to provide any history due to dementia and altered mental status and is otherwise supplemented by EDP, chart review, and patient's POA by phone. ? ?Recently admitted 03/28/2021-04/01/2021 for altered mental status thought secondary to dehydration.  Patient was treated empirically for UTI with antibiotics at that time as well.  CT head and EEG were negative. ? ?Per patient's cousin/POA, she has known dementia and is confused at baseline however is able to communicate fairly well with clear speech.  She does not have any difficulty with swallowing foods or liquids.  She is bedbound at baseline. ? ?Per EDP and ED documentation, patient was sent from her nursing facility for apparent altered mental status.  EMS reported that facility "nurse just handed them papers and left" without further history.  There was concern that patient's urine had a strong smell ? ?3/20 pt opens her eyes when I call her name.  She does not respond to my questions just stares at me. ? ?Consultants:  ? ? ?Procedures:  ? ?Antimicrobials:  ?cefepime  ? ? ?Subjective: ?Does not respond to my questions just stares at me.  Definitely more awake than yesterday. ? ? ?Objective: ?Vitals:  ? 05/01/21 0002 05/01/21 0423 05/01/21 0758 05/01/21 1241  ?BP: (!) 158/51 (!) 182/56 (!) 173/60 (!) 145/58  ?Pulse: 63 72  66  ?Resp: '20 19 17 17  '$ ?Temp: 98.3 ?F (36.8 ?C) 98.3 ?F (36.8 ?C) (!) 97.4 ?F (36.3 ?C) 97.9 ?F (36.6 ?C)  ?TempSrc: Axillary Oral Oral Oral  ?SpO2: 99%     ? ? ?Intake/Output Summary (Last 24 hours) at  05/01/2021 1254 ?Last data filed at 04/30/2021 1600 ?Gross per 24 hour  ?Intake 0 ml  ?Output --  ?Net 0 ml  ? ?There were no vitals filed for this visit. ? ?Examination: ?Calm, NAD ?Poor respiratory effort, no wheezing ?Reg s1/s2 no gallop ?Soft benign +bs ?No edema ?Awake, difficult to assess neuro ?Mood and affect appropriate in current setting  ? ? ? ?Data Reviewed: I have personally reviewed following labs and imaging studies ? ?CBC: ?Recent Labs  ?Lab 04/29/21 ?2111 04/29/21 ?2123 04/30/21 ?0120  ?WBC 6.2  --  7.3  ?NEUTROABS 4.2  --   --   ?HGB 12.3 12.2 11.7*  ?HCT 38.3 36.0 36.2  ?MCV 89.9  --  88.9  ?PLT 188  --  189  ? ?Basic Metabolic Panel: ?Recent Labs  ?Lab 04/29/21 ?2111 04/29/21 ?2123 04/30/21 ?0120 05/01/21 ?1194  ?NA 146* 146* 147* 145  ?K 3.4* 3.4* 4.0  --   ?CL 112* 113* 114*  --   ?CO2 23  --  25  --   ?GLUCOSE 113* 111* 122*  --   ?BUN '19 21 21  '$ --   ?CREATININE 0.62 0.60 0.78  --   ?CALCIUM 9.8  --  9.7  --   ? ?GFR: ?CrCl cannot be calculated (Unknown ideal weight.). ?Liver Function Tests: ?Recent Labs  ?Lab 04/29/21 ?2111  ?AST 25  ?ALT 24  ?ALKPHOS 105  ?BILITOT 0.4  ?PROT 6.0*  ?ALBUMIN 3.0*  ? ?  No results for input(s): LIPASE, AMYLASE in the last 168 hours. ?No results for input(s): AMMONIA in the last 168 hours. ?Coagulation Profile: ?Recent Labs  ?Lab 04/29/21 ?2111  ?INR 1.1  ? ?Cardiac Enzymes: ?No results for input(s): CKTOTAL, CKMB, CKMBINDEX, TROPONINI in the last 168 hours. ?BNP (last 3 results) ?No results for input(s): PROBNP in the last 8760 hours. ?HbA1C: ?No results for input(s): HGBA1C in the last 72 hours. ?CBG: ?No results for input(s): GLUCAP in the last 168 hours. ?Lipid Profile: ?No results for input(s): CHOL, HDL, LDLCALC, TRIG, CHOLHDL, LDLDIRECT in the last 72 hours. ?Thyroid Function Tests: ?No results for input(s): TSH, T4TOTAL, FREET4, T3FREE, THYROIDAB in the last 72 hours. ?Anemia Panel: ?No results for input(s): VITAMINB12, FOLATE, FERRITIN, TIBC, IRON,  RETICCTPCT in the last 72 hours. ?Sepsis Labs: ?Recent Labs  ?Lab 04/29/21 ?2111  ?LATICACIDVEN 1.3  ? ? ?Recent Results (from the past 240 hour(s))  ?Blood Culture (routine x 2)     Status: None (Preliminary result)  ? Collection Time: 04/29/21  9:12 PM  ? Specimen: BLOOD RIGHT HAND  ?Result Value Ref Range Status  ? Specimen Description BLOOD RIGHT HAND  Final  ? Special Requests   Final  ?  BOTTLES DRAWN AEROBIC AND ANAEROBIC Blood Culture adequate volume  ? Culture   Final  ?  NO GROWTH 2 DAYS ?Performed at [facility], [place] [address], [place], [place] [address] ?  ? Report Status PENDING  Incomplete  ?Urine Culture     Status: Abnormal  ? Collection Time: 04/29/21 10:40 PM  ? Specimen: In/Out Cath Urine  ?Result Value Ref Range Status  ? Specimen Description IN/OUT CATH URINE  Final  ? Special Requests   Final  ?  NONE ?Performed at [facility], [place] [address] [place], [place] [address] ?  ? Culture 40,000 COLONIES/mL YEAST (A)  Final  ? Report Status 05/01/2021 FINAL  Final  ?MRSA Next Gen by PCR, Nasal     Status: None  ? Collection Time: 04/30/21  2:30 PM  ? Specimen: Nasal Mucosa; Nasal Swab  ?Result Value Ref Range Status  ? MRSA by PCR Next Gen NOT DETECTED NOT DETECTED Final  ?  Comment: (NOTE) ?The GeneXpert MRSA Assay (FDA approved for NASAL specimens only), ?is one component of a comprehensive MRSA colonization surveillance ?program. It is not intended to diagnose MRSA infection nor to guide ?or monitor treatment for MRSA infections. ?Test performance is not FDA approved in patients less than 2 years ?old. ?Performed at [facility], [place] [address], [place], [place] ?[address] ?  ?  ? ? ? ? ? ?Radiology Studies: ?DG Chest 1 View ? ?Result Date: 04/29/2021 ?CLINICAL DATA:  Possible sepsis. EXAM: CHEST  1 VIEW COMPARISON:  03/28/2021 and prior radiographs FINDINGS: This is a low volume study. Continued bibasilar atelectasis/scarring again noted. Cardiomediastinal silhouette is  unchanged. There is no evidence of focal airspace disease, pulmonary edema, suspicious pulmonary nodule/mass, pleural effusion, or pneumothorax. No acute bony abnormalities are identified. IMPRESSION: Low volume study with unchanged bibasilar atelectasis/scarring. Electronically Signed   By: Margarette Canada M.D.   On: 04/29/2021 20:52  ? ?Abd 1 View (KUB) ? ?Result Date: 04/30/2021 ?CLINICAL DATA:  Constipation. EXAM: ABDOMEN - 1 VIEW COMPARISON:  None. FINDINGS: The bowel gas pattern is normal. There is a large amount of stool in the rectum. Otherwise there is average stool burden. No radio-opaque calculi or other significant radiographic abnormality are seen. There  is a surgical clip in the right quadrant. Degenerative changes affect the spine. IMPRESSION: 1. Nonobstructive bowel gas pattern. 2. Large amount of stool the rectum. Electronically Signed   By: Ronney Asters M.D.   On: 04/30/2021 01:02   ? ? ? ? ? ?Scheduled Meds: ? apixaban  5 mg Oral BID  ? busPIRone  5 mg Oral BID  ? leptospermum manuka honey  1 application. Topical Daily  ? metoprolol succinate  12.5 mg Oral Daily  ? polyethylene glycol  17 g Oral BID  ? senna  1 tablet Oral BID  ? sodium chloride flush  3 mL Intravenous Q12H  ? ?Continuous Infusions: ? dextrose 75 mL/hr at 05/01/21 1239  ? ? ?Assessment & Plan: ?  ?Principal Problem: ?  Acute encephalopathy ?Active Problems: ?  Dementia without behavioral disturbance ([place]) ?  Constipation ?  Hypothermia ?  Atrial fibrillation, chronic ([place]) ?  HTN (hypertension) ? ? ?Acute encephalopathy ?Dementia ?Likely due to dehydration.mildly hypernatremic ?Placed on delirium and fall precautions ?Abdominal x-ray with stool ?Chest x-ray negative for pneumonia, UA negative ? 3/20 antibiotics was discontinued since no infection. ?She is definitely more awake and alert than yesterday.  We will continue IV fluids but will decrease the rate to 50 MLS per hour. ?Since npo , will get SPL to evaluate swallowing ?   ?Constipation ?Bowel regimen ? ?Hypernatremia ?Improved with ivf ?Will monitor ? ? ?Atrial fibrillation, chronic ([place]) ?Remains in atrial fibrillation on admission with controlled rate.   ?3/20 continue Toprol-XL and Eliq

## 2021-05-02 DIAGNOSIS — G9340 Encephalopathy, unspecified: Secondary | ICD-10-CM | POA: Diagnosis not present

## 2021-05-02 MED ORDER — METOPROLOL SUCCINATE ER 25 MG PO TB24: 12.5000 mg | ORAL_TABLET | Freq: Every day | ORAL | Status: DC

## 2021-05-02 MED ORDER — MEDIHONEY WOUND/BURN DRESSING EX PSTE: 1.0000 "application " | PASTE | Freq: Every day | CUTANEOUS | Status: DC

## 2021-05-02 MED ORDER — BUSPIRONE HCL 5 MG PO TABS: 5.0000 mg | ORAL_TABLET | Freq: Two times a day (BID) | ORAL | Status: DC

## 2021-05-02 MED ORDER — APIXABAN 2.5 MG PO TABS: 2.5000 mg | ORAL_TABLET | Freq: Two times a day (BID) | ORAL | Status: DC

## 2021-05-02 MED ORDER — APIXABAN 2.5 MG PO TABS
2.5000 mg | ORAL_TABLET | Freq: Two times a day (BID) | ORAL | Status: DC
Start: 2021-05-02 — End: 2021-05-02
  Administered 2021-05-02: 2.5 mg via ORAL
  Filled 2021-05-02: qty 1

## 2021-05-02 MED ORDER — FLEET ENEMA 7-19 GM/118ML RE ENEM: 1.0000 | ENEMA | Freq: Once | RECTAL | 0 refills | Status: AC | PRN

## 2021-05-02 NOTE — TOC Transition Note (Addendum)
Transition of Care (TOC) - CM/SW Discharge Note ? ? ?Patient Details  ?Name: Kristina Wilkinson ?MRN: [id number] ?Date of Birth: May 06, 1927 ? ?Transition of Care (TOC) CM/SW Contact:  ?Reece Agar, LCSWA ?Phone Number: ?05/02/2021, 9:39 AM ? ? ?Clinical Narrative:    ?Patient will DC to: [facility]  ?Anticipated DC date: 05/02/2021 ?Family notified: Pt POA ?Transport by: Corey Harold ? ? ?Per MD patient ready for DC to [facility] room 703. RN to call report prior to discharge [phone]). RN, patient, patient's family, and facility notified of DC. Discharge Summary and FL2 sent to facility. DC packet on chart. Ambulance transport requested for patient.  ? ?CSW will sign off for now as social work intervention is no longer needed. Please consult [place] again if new needs arise. ?  ? ? ?  ?  ? ? ?Patient Goals and CMS Choice ?  ?  ?  ? ?Discharge Placement ?  ?           ?  ?  ?  ?  ? ?Discharge Plan and Services ?  ?  ?           ?  ?  ?  ?  ?  ?  ?  ?  ?  ?  ? ?Social Determinants of Health (SDOH) Interventions ?  ? ? ?Readmission Risk Interventions ?Readmission Risk Prevention Plan 03/30/2021 03/01/2021  ?Transportation Screening Complete Complete  ?PCP or Specialist Appt within 3-5 Days - Complete  ?[place] or Home Care Consult - Complete  ?Social Work Consult for [place] Planning/Counseling - Complete  ?Palliative Care Screening - Not Applicable  ?Medication Review Press photographer) Complete Complete  ?PCP or Specialist appointment within 3-5 days of discharge Complete -  ?[place] or Home Care Consult Complete -  ?SW Recovery Care/Counseling Consult Complete -  ?Palliative Care Screening Not Applicable -  ?Skilled Nursing Facility Complete -  ?Some recent data might be hidden  ? ? ? ? ? ?

## 2021-05-02 NOTE — Progress Notes (Signed)
PT Cancellation Note ? ?Patient Details ?Name: Kristina Wilkinson ?MRN: [id number] ?DOB: Jun 18, 1927 ? ? ?Cancelled Treatment:    Reason Eval/Treat Not Completed: PT screened, no needs identified, will sign off PT screen acutely to defer back to facility. Facility called and pt pending transport at this time ? ?Wyona Almas, PT, DPT ?Acute Rehabilitation Services ?Pager [phone] ?Office [phone] ? ?Deno Etienne ?05/02/2021, 12:26 PM ?

## 2021-05-02 NOTE — Progress Notes (Signed)
Gave report to [employer] from [facility] facility. Removed PIV and got patient ready for discharge. ?

## 2021-05-02 NOTE — Discharge Summary (Signed)
DAWNITA MOLNER AQT:[id number] DOB: 10-05-1927 DOA: 04/29/2021 ? ?PCP: Pcp, No ? ?Admit date: 04/29/2021 ?Discharge date: 05/02/2021 ? ?Admitted From: SNF ?Disposition:  SNF ? ?Recommendations for Outpatient Follow-up:  ?Follow up with PCP in 1 week ?Please obtain BMP/CBC in one week ? ? ?  ? ? ?Discharge Condition:Stable ?CODE STATUS:DNR  ?Diet recommendation: Dysphagia 3. Can reevaluate at SNF to advance if tolerates ? ? ?Brief/Interim Summary: ?KAILLY RICHOUX is a 88 y.o. female with medical history significant for dementia, chronic atrial fibrillation on Eliquis, HTN, diet-controlled T2DM, history of DVT, remote CNS aneurysm s/p clipping, history of CVA who presented to the ED from [facility] facility for evaluation of altered mental status.  Patient is unable to provide any history due to dementia and altered mental status and is otherwise supplemented by EDP, chart review, and patient's POA by phone. ? ?Recently admitted 03/28/2021-04/01/2021 for altered mental status thought secondary to dehydration.  Patient was treated empirically for UTI with antibiotics at that time as well.  CT head and EEG were negative. ? ?Per patient's cousin/POA, she has known dementia and is confused at baseline however is able to communicate fairly well with clear speech.  She does not have any difficulty with swallowing foods or liquids.  She is bedbound at baseline. ? ?Per EDP and ED documentation, patient was sent from her nursing facility for apparent altered mental status.  EMS reported that facility "nurse just handed them papers and left" without further history.  There was concern that patient's urine had a strong smell. ? ?At time of admission, patient is intermittently agitated when she is moved in bed for nursing care.  She moans intermittently and is otherwise not communicating clearly. ?  ?ED Course  Labs/Imaging on admission: I have personally reviewed following labs and imaging studies. ?  ?Initial vitals showed BP  156/73, pulse 77, RR 20, temp 94.8 ?F rectally, SPO2 95% on room air. ?  ?Labs show WBC 6.2, hemoglobin 12.3, platelets 188,000, sodium 146, potassium 3.4, bicarb 23, BUN 19, creatinine 0.62, serum glucose 113, LFTs within normal limits, lactic acid 1.3. ?  ?Urinalysis negative for UTI.  Blood culture TDN. Ucx with yeast, which is part of normla flora. Has no urinary symptoms.  ? Abdominal xr with stool.cxr with Low volume study with unchanged bibasilar atelectasis/scarring ?  ?The hospitalist service was consulted to admit for further evaluation and management. ?Patient was found to be dehydrated and hypernatremia.  ? Was started on IVF , sodium levels improved , was more hydrated. Patient today speaking and feeding herself. Stable for discharge. ? ? ?Acute encephalopathy ?Dementia ?Acute encephalopathy superimposed on background of dementia.  ?Due  to dehydrated  and  hypernatremia . ?Improved with IVF. ?Sodium level normal now. ?MS at baseline ?  ?Constipation ?Large stool ball in the rectum was seen on last admission.  ?Started on bowel regimen ?Continue with aggressive regimen at SNF. ? ?Hypothermia ?Rectal temp on ED arrival was 94.8 ?F however this was felt to be inaccurate due to contamination from rectal stool.  Repeat oral and axillary temperatures have been >97 ?F.  ?Stable. ?No bear hugger needed. ?  ?Atrial fibrillation, chronic ([place]) ?Remains in atrial fibrillation on admission with controlled rate.   ?Continue beta blk and Eliquis (dose adjusted) ?Monitor HR, if needed can increase beta blk dose. ? ?  ?HTN (hypertension) ?Continue beta blk ? ? ? ?Discharge Diagnoses:  ?Principal Problem: ?  Acute encephalopathy ?Active Problems: ?  Dementia without behavioral  disturbance ([place]) ?  Constipation ?  Hypothermia ?  Atrial fibrillation, chronic ([place]) ?  HTN (hypertension) ? ? ? ?Discharge Instructions ? ?Discharge Instructions   ? ? Diet - low sodium heart healthy   Complete by: As directed ?  ? Discharge  wound care:   Complete by: As directed ?  ? As above  ? Increase activity slowly   Complete by: As directed ?  ? ?  ? ?Allergies as of 05/02/2021   ? ?   Reactions  ? Gabapentin Itching, Other (See Comments)  ? Made the legs "go numb" and "allergic," per [facility]  ? Other Other (See Comments)  ? "MD mixed a blood pressure medication with the Clinoril I was taking and I couldn't walk" "made me achy and sick"  ? Penicillins Other (See Comments), Itching  ? Has patient had a PCN reaction causing immediate rash, facial/tongue/throat swelling, SOB or lightheadedness with hypotension: Unk- exact reaction not recalled, but is allergic ?Has patient had a PCN reaction causing severe rash involving mucus membranes or skin necrosis: Unk ?Has patient had a PCN reaction that required hospitalization: Unk ?Has patient had a PCN reaction occurring within the last 10 years: Yes ?If all of the above answers are "NO", then may proceed with Cephalosporin use. ?Itching and rash  ? Pregabalin Other (See Comments)  ? "Made me pass out" and "made the limbs go numb"- "allergic," per [facility]  ? Vit D-vit E-safflower Oil Other (See Comments)  ? "Made me achy and sick"  ? ?  ? ?  ?Medication List  ?  ? ?STOP taking these medications   ? ?potassium chloride SA 20 MEQ tablet ?Commonly known as: KLOR-CON M ?  ? ?  ? ?TAKE these medications   ? ?acetaminophen 325 MG tablet ?Commonly known as: TYLENOL ?Take 2 tablets (650 mg total) by mouth every 6 (six) hours as needed for mild pain (or Fever >/= 101). ?  ?apixaban 2.5 MG Tabs tablet ?Commonly known as: ELIQUIS ?Take 1 tablet (2.5 mg total) by mouth 2 (two) times daily. ?What changed:  ?medication strength ?how much to take ?  ?bisacodyl 10 MG suppository ?Commonly known as: DULCOLAX ?Place 1 suppository (10 mg total) rectally daily as needed for moderate constipation. ?  ?busPIRone 5 MG tablet ?Commonly known as: BUSPAR ?Take 1 tablet (5 mg total) by mouth 2 (two) times daily. ?What changed: when to take  this ?  ?Decubi-Vite Caps ?Take 1 capsule by mouth daily. ?  ?leptospermum manuka honey Pste paste ?Apply 1 application. topically daily. ?Start taking on: May 03, 2021 ?  ?metoprolol succinate 25 MG 24 hr tablet ?Commonly known as: TOPROL-XL ?Take 0.5 tablets (12.5 mg total) by mouth daily. ?Start taking on: May 03, 2021 ?  ?polyethylene glycol 17 g packet ?Commonly known as: MIRALAX / GLYCOLAX ?Take 17 g by mouth 2 (two) times daily. ?  ?polyvinyl alcohol 1.4 % ophthalmic solution ?Commonly known as: LIQUIFILM TEARS ?Place 1 drop into both eyes as needed for dry eyes. ?  ?senna 8.6 MG Tabs tablet ?Commonly known as: SENOKOT ?Take 8.6 mg by mouth in the morning and at bedtime. ?  ?sodium phosphate 7-19 GM/118ML Enem ?Place 133 mLs (1 enema total) rectally once as needed for severe constipation. ?  ?UNABLE TO FIND ?Take 90 mLs by mouth 2 (two) times daily. Med Name: Medpass 2.0 , Nutritional supplement ?  ? ?  ? ?  ?  ? ? ?  ?Discharge Care Instructions  ?(  From admission, onward)  ?  ? ? ?  ? ?  Start     Ordered  ? 05/02/21 0000  Discharge wound care:       ?Comments: As above  ? 05/02/21 0850  ? ?  ?  ? ?  ? ? ?Allergies  ?Allergen Reactions  ? Gabapentin Itching and Other (See Comments)  ?  Made the legs "go numb" and "allergic," per [facility]  ? Other Other (See Comments)  ?  "MD mixed a blood pressure medication with the Clinoril I was taking and I couldn't walk" "made me achy and sick"  ? Penicillins Other (See Comments) and Itching  ?  Has patient had a PCN reaction causing immediate rash, facial/tongue/throat swelling, SOB or lightheadedness with hypotension: Unk- exact reaction not recalled, but is allergic ?Has patient had a PCN reaction causing severe rash involving mucus membranes or skin necrosis: Unk ?Has patient had a PCN reaction that required hospitalization: Unk ?Has patient had a PCN reaction occurring within the last 10 years: Yes ?If all of the above answers are "NO", then may proceed with  Cephalosporin use. ? ?Itching and rash  ? Pregabalin Other (See Comments)  ?  "Made me pass out" and "made the limbs go numb"- "allergic," per [facility]  ? Vit D-Vit E-Safflower Oil Other (See Comments)  ?  "Made me

## 2021-05-02 NOTE — Progress Notes (Signed)
OT Note ? ?Patient Details ?Name: Kristina Wilkinson ?MRN: [id number] ?DOB: Aug 12, 1927 ? ? ?Cancelled Treatment:    Reason Eval/Treat Not Completed: Other (comment) (OT screen acutely to defer back to facility. Facility called and pt pending transport at this time) ? ?[name] ?05/02/2021, 12:11 PM ? ?[place], OTR/L  ?Acute Rehabilitation Services ?Pager: [phone] ?Office: [phone] ?. ? ?

## 2021-05-02 NOTE — Progress Notes (Signed)
Changed Right forearm and bilateral heel dressings this morning. Patient tolerate it well. Will continue monitor patient.  ?

## 2021-05-02 NOTE — Progress Notes (Signed)
ANTICOAGULATION  NOTE  ? ?Pharmacy Consult for Eliquis ?Indication: atrial fibrillation ? ? ?Patient Measurements: ?Weight: 58.5 kg (128 lb 15.5 oz) ? ?Labs: ?Recent Labs  ?  04/29/21 ?2111 04/29/21 ?2123 04/30/21 ?0120  ?HGB 12.3 12.2 11.7*  ?HCT 38.3 36.0 36.2  ?PLT 188  --  189  ?APTT 31  --   --   ?LABPROT 14.1  --   --   ?INR 1.1  --   --   ?CREATININE 0.62 0.60 0.78  ? ? ?Estimated Creatinine Clearance: 41.4 mL/min (by C-G formula based on SCr of 0.78 mg/dL). ? ? ?Assessment: ?Pt is currently on Eliquis 5 mg BID.  Pt weight hovering right around 60 kg. ? ?Goal of Therapy:  ? ?Monitor platelets by anticoagulation protocol: Yes ?  ?Plan:  ?Pt is 26 yr, and weight < 60 kg at times.  I am going to dose-reduce Eliquis to 2.5 mg BID since she meets criteria for lower dosing. ? ?[place] Crane, Pharm D, BCPS, BCCP ?Clinical Pharmacist ? 05/02/2021 8:26 AM  ? ?[facility] pharmacy phone numbers are listed on [email or web address] ? ? ?

## 2021-05-02 NOTE — Progress Notes (Signed)
Speech Language Pathology Treatment: Dysphagia  ?Patient Details ?Name: Kristina Wilkinson ?MRN: [id number] ?DOB: January 09, 1928 ?Today's Date: 05/02/2021 ?Time: 8979-1504 ?SLP Time Calculation (min) (ACUTE ONLY): 20 min ? ?Assessment / Plan / Recommendation ?Clinical Impression ? Kristina Wilkinson was seen at bedside for dysphagia treatment. Pt alert and cooperative throughout the entirety of the session. Pt vocalized more since the previous session and communicated wants/needs following breakfast. Pt completed ~90% of Dys 3 breakfast tray prior to SLP entering room. Pt verbalized that she fed herself items on the tray and did not receive assist. Pt accepted consecutive sips of thin liquid with no overt s/s of aspiration noted. SLP facilitated ordering lunch for pt. When provided with two choices, pt was able to communicate which of the items she wanted (I.e., pears or pudding). Pt benefited from hearing what would be on her lunch tray at the end of the session. Pt appears to be tolerating Dys 3 diet and thin liquids. SLP to sign off as pt is discharging back to SNF with appropriate diet at this time.  ?  ?HPI HPI: Pt is a 88 year-old female presenting from SNF with AMS. Recently admitted 03/28/2021-04/01/2021 for altered mental status thought secondary to dehydration.  Patient was treated empirically for UTI with antibiotics at that time as well. PMH includes: DM2, GERD, CNS aneurysm s/p clipping, DVT, HTN ?  ?   ?SLP Plan ? All goals met ? ?  ?  ?Recommendations for follow up therapy are one component of a multi-disciplinary discharge planning process, led by the attending physician.  Recommendations may be updated based on patient status, additional functional criteria and insurance authorization. ?  ? ?Recommendations  ?Diet recommendations: Dysphagia 3 (mechanical soft);Thin liquid ?Liquids provided via: Straw ?Medication Administration: Whole meds with liquid ?Supervision: Patient able to self feed ?Compensations: Minimize  environmental distractions;Slow rate;Small sips/bites ?Postural Changes and/or Swallow Maneuvers: Seated upright 90 degrees  ?   ?    ?   ? ? ? ? Oral Care Recommendations: Oral care BID ?Follow Up Recommendations: Skilled nursing-short term rehab (<3 hours/day) ?Assistance recommended at discharge: Frequent or constant Supervision/Assistance ?SLP Visit Diagnosis: Dysphagia, unspecified (R13.10) ?Plan: All goals met ? ? ? ? ?  ?  ? ? ?Vaughan Sine ? ?05/02/2021, 9:40 AM ?

## 2021-05-04 LAB — CULTURE, BLOOD (ROUTINE X 2)
Culture: NO GROWTH
Special Requests: ADEQUATE

## 2021-05-08 ENCOUNTER — Other Ambulatory Visit: Payer: Self-pay

## 2021-05-08 ENCOUNTER — Non-Acute Institutional Stay: Payer: Medicare Other | Admitting: Family Medicine

## 2021-05-08 ENCOUNTER — Encounter: Payer: Self-pay | Admitting: Family Medicine

## 2021-05-08 VITALS — Temp 97.7°F | Resp 20

## 2021-05-08 DIAGNOSIS — K5900 Constipation, unspecified: Secondary | ICD-10-CM

## 2021-05-08 DIAGNOSIS — F03918 Unspecified dementia, unspecified severity, with other behavioral disturbance: Secondary | ICD-10-CM

## 2021-05-08 DIAGNOSIS — I482 Chronic atrial fibrillation, unspecified: Secondary | ICD-10-CM

## 2021-05-08 DIAGNOSIS — Z515 Encounter for palliative care: Secondary | ICD-10-CM

## 2021-05-08 DIAGNOSIS — E440 Moderate protein-calorie malnutrition: Secondary | ICD-10-CM | POA: Insufficient documentation

## 2021-05-08 NOTE — Progress Notes (Signed)
? ? ?Manufacturing engineer ?Community Palliative Care Consult Note ?Telephone: [phone]  ?Fax: [phone]  ? ?Date of encounter: 05/08/21 ?4:46 PM ?PATIENT NAME: Kristina Wilkinson ?[place] ?K[place] Wilkinson [id number]   ?[phone] (home)  ?DOB: [birth year] ?MRN: [id number] ?PRIMARY CARE PROVIDER:    ?Pcp, No,  ?No address on file ?None ? ?REFERRING PROVIDER:   ?No referring provider defined for this encounter. ?N/A ? ?RESPONSIBLE PARTY:    ?Contact Information   ? ? Name Relation Home Work Mobile  ? Brady,Britt Relative   [phone]  ? Radford,Cynthia Friend   [phone]  ? ?  ? ? ? ?I met face to face with patient in [facility] facility. Palliative Care was asked to follow this patient by consultation request of facility PCP to address advance care planning and complex medical decision making. This is the initial visit.  ? ? ?      ASSESSMENT, SYMPTOM MANAGEMENT AND PLAN / RECOMMENDATIONS:  ? Palliative Care Encounter ?DNR/DNI/comfort measures was last code status confirmed by phone with [place] and Hospitalist. ?Need to follow up with [facility] POA and complete MOST if possible. ? ?2.  Dementia with behavioral disturbance ?Agitated and threatened aggression. ?Not currently taking medications, Has Buspar 5 mg BID. ?Question if dehydration present and contributing to an acute delirium superimposed on dementia. ? ?3.  Moderate protein calorie malnutrition ?Refusing most food and fluids, intake of "bites" ?Last Albumin had decreased from 3.4 to 3.0 in 1 month on 04/29/21 with multiple electrolyte derangements. ?Not taking meds. ?Pt may rapidly be approaching end stages with dehydration/lack of intake. ? ?4.  Chronic afib with LE DVT ?Normally rate controlled on Metoprolol XL 25 mg daily but pt refusing meds. ?Has Eliquis 2.5 mg BID ordered but refusing. ? ?5.  Constipation unspecified ?Has had noted rectal stool ball on recent CT and xray imaging. ?Not taking medications-could  benefit from disimpaction and mineral oil enema but pt remains combative. ?Will be difficult to address without some level of cooperation from pt or without consciously medicating her. ? ? ?Follow up Palliative Care Visit: Palliative care will continue to follow for complex medical decision making, advance care planning, and clarification of goals. Return 1-2 weeks or prn. ? ? ? ?This visit was coded based on medical decision making (MDM). ? ?PPS: 20% ? ?HOSPICE ELIGIBILITY/DIAGNOSIS: TBD ? ?Chief Complaint:  ?[facility] received a referral to follow up with patient for chronic disease management of patient with dementia, advance directive and defining/refining goals of care.  ? ?HISTORY OF PRESENT ILLNESS:  Kristina Wilkinson is a 88 y.o. year old female with dementia, basilar artery aneurysm s/p coil, recent DVT with refusal of anticoagulation with aneurysm but currently on low dose Eliquis, hx of stroke, arthritis, afib, GERD, DM, acute encephalopathy and peripheral neuropathy, spinal stenosis, Vitamins B12 and D deficiency.  Pt had recent admission for AMS, negative for UTI who had noted stool ball in rectum on KUB, hypernatremic and dehydrated. Nursing staff at facility indicated pt is confused at baseline, threatens combative behaviors if touched with poor oral intake of food and fluids.  Na on 05/01/21 was 145, chloride elevated at 114. Albumin low at 3.0.  Last HGB A1c on 03/01/21 was 6.1%.  When attempting auscultation of heart and lung sounds pt states "Get off me, get out of my way or I will hit you."  Despite the fact that only provider stethoscope is on patient's chest she gestures  with a clenched fist and does not accept provider apology.  ? ?History obtained from review of EMR, discussion with facility staff and/or Ms. Ida.  ?I reviewed available labs, medications, imaging, studies and related documents from the EMR.  Records reviewed and summarized above.  ? ?ROS ?Unable to  obtain from pt with severe dementia, agitated and threatening ? ?Physical Exam: ?Current and past weights: 128 lbs 15.5 ounces as of 05/02/21, weight 143 lbs 11.8 ounces on 02/28/21 ?Constitutional: NAD ?General: frail appearing, thin ?EYES: anicteric sclera, lids intact, no discharge  ?ENMT: intact hearing, ?CV: S1S2, IRIR, no LE edema ?Pulmonary: CTAB, no increased work of breathing, no cough, room air ?Abdomen: hypo-active BS + 4 quadrants, soft  ?GU: deferred ?MSK: moves all extremities, bedbound ?Skin: warm and dry, wearing protective boots.  Bilat forearms with varying size and stages of healing purple ecchymoses ?Neuro:   noted cognitive impairment ?Psych: mod anxious affect, confused and combative ?Hem/lymph/immuno: noted widespread bruising of BUE ? ?CURRENT PROBLEM LIST:  ?Patient Active Problem List  ? Diagnosis Date Noted  ? Constipation 04/30/2021  ? Hypothermia 04/29/2021  ? Acute encephalopathy 03/29/2021  ? Pressure injury of skin 03/01/2021  ? Sepsis secondary to UTI ([place]) 02/28/2021  ? Dementia without behavioral disturbance ([place]) 02/28/2021  ? Normocytic anemia 02/28/2021  ? Atrial fibrillation, chronic ([place]) 09/25/2020  ? Restless leg syndrome 03/14/2019  ? Sepsis due to gram-negative UTI ([place]) 03/06/2019  ? Sepsis due to urinary tract infection ([place]) 03/05/2019  ? Falls 03/05/2019  ? Open toe wound 12/01/2018  ? Traumatic rhabdomyolysis ([place]) 11/30/2018  ? Cellulitis 11/30/2018  ? Transaminitis 11/30/2018  ? Abnormal urinalysis 11/30/2018  ? Leukocytosis 11/30/2018  ? Hypokalemia 11/30/2018  ? Scalp laceration   ? Skull fracture ([place]) 07/13/2016  ? Chronic left shoulder pain 04/03/2016  ? DM2 (diabetes mellitus, type 2) ([place]) 06/27/2015  ? Hearing loss 06/21/2015  ? Advance care planning 12/17/2014  ? Palpitations 07/06/2014  ? Pain in joint, ankle and foot 04/20/2014  ? DVT, lower extremity ([place]) 11/19/2013  ? Edema 10/20/2013  ? Headache(784.0) 10/20/2013  ? Left leg swelling 10/20/2013  ?  Aneurysm of basilar artery (HCC) 10/20/2013  ? Bradycardia 10/20/2013  ? Vitamin B12 deficiency 07/16/2013  ? Paresthesia of both feet 05/17/2013  ? TMJ arthritis 09/11/2012  ? Ear pain 09/11/2012  ? History of transfusion of packed red blood cells 09/11/2012  ? Spinal stenosis 04/13/2011  ? Well adult exam 01/23/2011  ? Cholecystitis with cholelithiasis, s/p lap chole 11/10/2010 12/01/2010  ? Shoulder pain, right 07/25/2010  ? TOBACCO USE, QUIT 03/09/2009  ? KNEE PAIN 05/18/2008  ? PN (peripheral neuropathy) 08/19/2007  ? LEG PAIN 08/19/2007  ? Vitamin D deficiency 02/17/2007  ? INSOMNIA, PERSISTENT 02/17/2007  ? HTN (hypertension) 02/17/2007  ? GERD 02/17/2007  ? Osteoarthritis 02/17/2007  ? LOW BACK PAIN 02/17/2007  ? Hyperglycemia 01/28/2007  ? UNSPECIFIED OSTEOPOROSIS 01/28/2007  ? PRECIPITOUS DROP IN HEMATOCRIT 01/28/2007  ? ?PAST MEDICAL HISTORY:  ?Active Ambulatory Problems  ?  Diagnosis Date Noted  ? Hyperglycemia 01/28/2007  ? Vitamin D deficiency 02/17/2007  ? INSOMNIA, PERSISTENT 02/17/2007  ? PN (peripheral neuropathy) 08/19/2007  ? HTN (hypertension) 02/17/2007  ? GERD 02/17/2007  ? Osteoarthritis 02/17/2007  ? KNEE PAIN 05/18/2008  ? LOW BACK PAIN 02/17/2007  ? LEG PAIN 08/19/2007  ? UNSPECIFIED OSTEOPOROSIS 01/28/2007  ? PRECIPITOUS DROP IN HEMATOCRIT 01/28/2007  ? TOBACCO USE, QUIT 03/09/2009  ? Shoulder pain, right 07/25/2010  ? Cholecystitis with cholelithiasis,  s/p lap chole 11/10/2010 12/01/2010  ? Well adult exam 01/23/2011  ? Spinal stenosis 04/13/2011  ? TMJ arthritis 09/11/2012  ? Ear pain 09/11/2012  ? History of transfusion of packed red blood cells 09/11/2012  ? Paresthesia of both feet 05/17/2013  ? Vitamin B12 deficiency 07/16/2013  ? Edema 10/20/2013  ? Headache(784.0) 10/20/2013  ? Left leg swelling 10/20/2013  ? Aneurysm of basilar artery (HCC) 10/20/2013  ? Bradycardia 10/20/2013  ? DVT, lower extremity ([place]) 11/19/2013  ? Pain in joint, ankle and foot 04/20/2014  ? Palpitations  07/06/2014  ? Advance care planning 12/17/2014  ? Hearing loss 06/21/2015  ? DM2 (diabetes mellitus, type 2) ([place]) 06/27/2015  ? Skull fracture ([place]) 07/13/2016  ? Scalp laceration   ? Chronic left shoulder pain 02/20

## 2021-05-10 ENCOUNTER — Emergency Department (HOSPITAL_COMMUNITY): Payer: Medicare Other

## 2021-05-10 ENCOUNTER — Inpatient Hospital Stay (HOSPITAL_COMMUNITY)
Admission: EM | Admit: 2021-05-10 | Discharge: 2021-05-17 | DRG: 177 | Disposition: A | Discharge status: Hospice | Payer: Medicare Other | Source: Skilled Nursing Facility | Attending: Internal Medicine | Admitting: Internal Medicine

## 2021-05-10 ENCOUNTER — Other Ambulatory Visit: Payer: Self-pay

## 2021-05-10 DIAGNOSIS — B952 Enterococcus as the cause of diseases classified elsewhere: Secondary | ICD-10-CM | POA: Diagnosis present

## 2021-05-10 DIAGNOSIS — A419 Sepsis, unspecified organism: Secondary | ICD-10-CM | POA: Diagnosis present

## 2021-05-10 DIAGNOSIS — J9601 Acute respiratory failure with hypoxia: Secondary | ICD-10-CM | POA: Diagnosis present

## 2021-05-10 DIAGNOSIS — Z515 Encounter for palliative care: Secondary | ICD-10-CM | POA: Diagnosis not present

## 2021-05-10 DIAGNOSIS — Z9181 History of falling: Secondary | ICD-10-CM

## 2021-05-10 DIAGNOSIS — B9689 Other specified bacterial agents as the cause of diseases classified elsewhere: Secondary | ICD-10-CM | POA: Diagnosis present

## 2021-05-10 DIAGNOSIS — I482 Chronic atrial fibrillation, unspecified: Secondary | ICD-10-CM | POA: Diagnosis present

## 2021-05-10 DIAGNOSIS — D638 Anemia in other chronic diseases classified elsewhere: Secondary | ICD-10-CM | POA: Diagnosis present

## 2021-05-10 DIAGNOSIS — J189 Pneumonia, unspecified organism: Secondary | ICD-10-CM

## 2021-05-10 DIAGNOSIS — J690 Pneumonitis due to inhalation of food and vomit: Principal | ICD-10-CM | POA: Diagnosis present

## 2021-05-10 DIAGNOSIS — N390 Urinary tract infection, site not specified: Secondary | ICD-10-CM | POA: Diagnosis present

## 2021-05-10 DIAGNOSIS — Z87891 Personal history of nicotine dependence: Secondary | ICD-10-CM | POA: Diagnosis not present

## 2021-05-10 DIAGNOSIS — L89610 Pressure ulcer of right heel, unstageable: Secondary | ICD-10-CM | POA: Diagnosis present

## 2021-05-10 DIAGNOSIS — R0602 Shortness of breath: Secondary | ICD-10-CM | POA: Diagnosis not present

## 2021-05-10 DIAGNOSIS — E119 Type 2 diabetes mellitus without complications: Secondary | ICD-10-CM

## 2021-05-10 DIAGNOSIS — G9341 Metabolic encephalopathy: Secondary | ICD-10-CM | POA: Diagnosis present

## 2021-05-10 DIAGNOSIS — Z8619 Personal history of other infectious and parasitic diseases: Secondary | ICD-10-CM

## 2021-05-10 DIAGNOSIS — M1990 Unspecified osteoarthritis, unspecified site: Secondary | ICD-10-CM | POA: Diagnosis present

## 2021-05-10 DIAGNOSIS — E876 Hypokalemia: Secondary | ICD-10-CM | POA: Diagnosis present

## 2021-05-10 DIAGNOSIS — F03918 Unspecified dementia, unspecified severity, with other behavioral disturbance: Secondary | ICD-10-CM | POA: Diagnosis present

## 2021-05-10 DIAGNOSIS — B957 Other staphylococcus as the cause of diseases classified elsewhere: Secondary | ICD-10-CM | POA: Diagnosis present

## 2021-05-10 DIAGNOSIS — I10 Essential (primary) hypertension: Secondary | ICD-10-CM | POA: Diagnosis present

## 2021-05-10 DIAGNOSIS — E870 Hyperosmolality and hypernatremia: Secondary | ICD-10-CM | POA: Diagnosis present

## 2021-05-10 DIAGNOSIS — G629 Polyneuropathy, unspecified: Secondary | ICD-10-CM | POA: Diagnosis present

## 2021-05-10 DIAGNOSIS — Z8261 Family history of arthritis: Secondary | ICD-10-CM

## 2021-05-10 DIAGNOSIS — Z634 Disappearance and death of family member: Secondary | ICD-10-CM

## 2021-05-10 DIAGNOSIS — Z79899 Other long term (current) drug therapy: Secondary | ICD-10-CM

## 2021-05-10 DIAGNOSIS — Z66 Do not resuscitate: Secondary | ICD-10-CM | POA: Diagnosis present

## 2021-05-10 DIAGNOSIS — Z7901 Long term (current) use of anticoagulants: Secondary | ICD-10-CM

## 2021-05-10 DIAGNOSIS — Z8679 Personal history of other diseases of the circulatory system: Secondary | ICD-10-CM

## 2021-05-10 DIAGNOSIS — Z96652 Presence of left artificial knee joint: Secondary | ICD-10-CM | POA: Diagnosis present

## 2021-05-10 DIAGNOSIS — E872 Acidosis, unspecified: Secondary | ICD-10-CM

## 2021-05-10 DIAGNOSIS — L89620 Pressure ulcer of left heel, unstageable: Secondary | ICD-10-CM | POA: Diagnosis present

## 2021-05-10 DIAGNOSIS — R4182 Altered mental status, unspecified: Principal | ICD-10-CM

## 2021-05-10 LAB — URINALYSIS, MICROSCOPIC (REFLEX)
Squamous Epithelial / HPF: NONE SEEN (ref 0–5)
WBC, UA: >50 WBC/hpf (ref 0–5)

## 2021-05-10 LAB — TROPONIN I (HIGH SENSITIVITY)
Troponin I (High Sensitivity): 21 ng/L — ABNORMAL HIGH (ref <18)
Troponin I (High Sensitivity): 19 ng/L — ABNORMAL HIGH (ref <18)

## 2021-05-10 LAB — I-STAT VENOUS BLOOD GAS, ED
Acid-base deficit: 4 mmol/L — ABNORMAL HIGH (ref 0.0–2.0)
Bicarbonate: 20.4 mmol/L (ref 20.0–28.0)
Calcium, Ion: 1.18 mmol/L (ref 1.15–1.40)
HCT: 35 % — ABNORMAL LOW (ref 36.0–46.0)
Hemoglobin: 11.9 g/dL — ABNORMAL LOW (ref 12.0–15.0)
O2 Saturation: 99 %
Potassium: 4 mmol/L (ref 3.5–5.1)
Sodium: 143 mmol/L (ref 135–145)
TCO2: 21 mmol/L — ABNORMAL LOW (ref 22–32)
pCO2, Ven: 33 mmHg — ABNORMAL LOW (ref 44–60)
pH, Ven: 7.399 (ref 7.25–7.43)
pO2, Ven: 145 mmHg — ABNORMAL HIGH (ref 32–45)

## 2021-05-10 LAB — PROTIME-INR
INR: 1.2 (ref 0.8–1.2)
Prothrombin Time: 15.1 seconds (ref 11.4–15.2)

## 2021-05-10 LAB — COMPREHENSIVE METABOLIC PANEL
ALT: 26 U/L (ref 0–44)
AST: 23 U/L (ref 15–41)
Albumin: 2.9 g/dL — ABNORMAL LOW (ref 3.5–5.0)
Alkaline Phosphatase: 111 U/L (ref 38–126)
Anion gap: 11 (ref 5–15)
BUN: 27 mg/dL — ABNORMAL HIGH (ref 8–23)
CO2: 20 mmol/L — ABNORMAL LOW (ref 22–32)
Calcium: 9.7 mg/dL (ref 8.9–10.3)
Chloride: 112 mmol/L — ABNORMAL HIGH (ref 98–111)
Creatinine, Ser: 0.87 mg/dL (ref 0.44–1.00)
GFR, Estimated: >60 mL/min (ref >60)
Glucose, Bld: 152 mg/dL — ABNORMAL HIGH (ref 70–99)
Potassium: 4.1 mmol/L (ref 3.5–5.1)
Sodium: 143 mmol/L (ref 135–145)
Total Bilirubin: 1.4 mg/dL — ABNORMAL HIGH (ref 0.3–1.2)
Total Protein: 6 g/dL — ABNORMAL LOW (ref 6.5–8.1)

## 2021-05-10 LAB — CBC
HCT: 38.6 % (ref 36.0–46.0)
Hemoglobin: 12.2 g/dL (ref 12.0–15.0)
MCH: 29.3 pg (ref 26.0–34.0)
MCHC: 31.6 g/dL (ref 30.0–36.0)
MCV: 92.6 fL (ref 80.0–100.0)
Platelets: 201 10*3/uL (ref 150–400)
RBC: 4.17 MIL/uL (ref 3.87–5.11)
RDW: 15 % (ref 11.5–15.5)
WBC: 11.3 10*3/uL — ABNORMAL HIGH (ref 4.0–10.5)
nRBC: 0 % (ref 0.0–0.2)

## 2021-05-10 LAB — URINALYSIS, ROUTINE W REFLEX MICROSCOPIC
Bilirubin Urine: NEGATIVE
Glucose, UA: NEGATIVE mg/dL
Ketones, ur: NEGATIVE mg/dL
Nitrite: POSITIVE — AB
Protein, ur: 100 mg/dL — AB
Specific Gravity, Urine: 1.01 (ref 1.005–1.030)
pH: 8 (ref 5.0–8.0)

## 2021-05-10 LAB — STREP PNEUMONIAE URINARY ANTIGEN: Strep Pneumo Urinary Antigen: NEGATIVE

## 2021-05-10 LAB — LACTIC ACID, PLASMA
Lactic Acid, Venous: 2.4 mmol/L (ref 0.5–1.9)
Lactic Acid, Venous: 1.3 mmol/L (ref 0.5–1.9)

## 2021-05-10 LAB — PROCALCITONIN: Procalcitonin: <0.1 ng/mL

## 2021-05-10 MED ORDER — POLYETHYLENE GLYCOL 3350 17 G PO PACK: 17.0000 g | PACK | Freq: Every day | ORAL | Status: DC | PRN

## 2021-05-10 MED ORDER — ACETAMINOPHEN 325 MG PO TABS: 650.0000 mg | ORAL_TABLET | Freq: Four times a day (QID) | ORAL | Status: DC | PRN

## 2021-05-10 MED ORDER — ENOXAPARIN SODIUM 60 MG/0.6ML IJ SOSY
60.0000 mg | PREFILLED_SYRINGE | Freq: Two times a day (BID) | INTRAMUSCULAR | Status: DC
  Administered 2021-05-11 – 2021-05-12 (×3): 60 mg via SUBCUTANEOUS
  Filled 2021-05-10 (×6): qty 0.6

## 2021-05-10 MED ORDER — DOCUSATE SODIUM 100 MG PO CAPS
100.0000 mg | ORAL_CAPSULE | Freq: Two times a day (BID) | ORAL | Status: DC
  Administered 2021-05-12: 100 mg via ORAL
  Filled 2021-05-10 (×3): qty 1

## 2021-05-10 MED ORDER — ONDANSETRON HCL 4 MG PO TABS: 4.0000 mg | ORAL_TABLET | Freq: Four times a day (QID) | ORAL | Status: DC | PRN

## 2021-05-10 MED ORDER — SODIUM CHLORIDE 0.9 % IV SOLN
2.0000 g | Freq: Two times a day (BID) | INTRAVENOUS | Status: DC
  Administered 2021-05-10 – 2021-05-13 (×7): 2 g via INTRAVENOUS
  Filled 2021-05-10 (×7): qty 2

## 2021-05-10 MED ORDER — ENOXAPARIN SODIUM 40 MG/0.4ML IJ SOSY
40.0000 mg | PREFILLED_SYRINGE | Freq: Every day | INTRAMUSCULAR | Status: DC
  Administered 2021-05-10: 40 mg via SUBCUTANEOUS
  Filled 2021-05-10: qty 0.4

## 2021-05-10 MED ORDER — HALOPERIDOL LACTATE 5 MG/ML IJ SOLN
5.0000 mg | Freq: Four times a day (QID) | INTRAMUSCULAR | Status: DC | PRN
  Administered 2021-05-12 – 2021-05-13 (×2): 5 mg via INTRAVENOUS
  Filled 2021-05-10 (×2): qty 1

## 2021-05-10 MED ORDER — GUAIFENESIN ER 600 MG PO TB12: 600.0000 mg | ORAL_TABLET | Freq: Two times a day (BID) | ORAL | Status: DC | PRN

## 2021-05-10 MED ORDER — METRONIDAZOLE 500 MG/100ML IV SOLN
500.0000 mg | Freq: Two times a day (BID) | INTRAVENOUS | Status: DC
  Administered 2021-05-10 – 2021-05-13 (×7): 500 mg via INTRAVENOUS
  Filled 2021-05-10 (×7): qty 100

## 2021-05-10 MED ORDER — ALBUTEROL SULFATE (2.5 MG/3ML) 0.083% IN NEBU: 2.5000 mg | INHALATION_SOLUTION | RESPIRATORY_TRACT | Status: DC | PRN

## 2021-05-10 MED ORDER — ACETAMINOPHEN 650 MG RE SUPP: 650.0000 mg | Freq: Four times a day (QID) | RECTAL | Status: DC | PRN

## 2021-05-10 MED ORDER — VANCOMYCIN HCL IN DEXTROSE 1-5 GM/200ML-% IV SOLN
1000.0000 mg | Freq: Once | INTRAVENOUS | Status: AC
  Administered 2021-05-10: 1000 mg via INTRAVENOUS
  Filled 2021-05-10: qty 200

## 2021-05-10 MED ORDER — BISACODYL 5 MG PO TBEC: 5.0000 mg | DELAYED_RELEASE_TABLET | Freq: Every day | ORAL | Status: DC | PRN

## 2021-05-10 MED ORDER — ONDANSETRON HCL 4 MG/2ML IJ SOLN
4.0000 mg | Freq: Four times a day (QID) | INTRAMUSCULAR | Status: DC | PRN
Start: 2021-05-10 — End: 2021-05-17

## 2021-05-10 MED ORDER — SODIUM CHLORIDE 0.9 % IV SOLN: INTRAVENOUS | Status: DC

## 2021-05-10 MED ORDER — SODIUM CHLORIDE 0.9% FLUSH
3.0000 mL | Freq: Two times a day (BID) | INTRAVENOUS | Status: DC
  Administered 2021-05-11 – 2021-05-16 (×6): 3 mL via INTRAVENOUS

## 2021-05-10 MED ORDER — IOHEXOL 350 MG/ML SOLN
100.0000 mL | Freq: Once | INTRAVENOUS | Status: AC | PRN
  Administered 2021-05-10: 50 mL via INTRAVENOUS

## 2021-05-10 MED ORDER — SODIUM CHLORIDE 0.9 % IV BOLUS
500.0000 mL | Freq: Once | INTRAVENOUS | Status: AC
  Administered 2021-05-10: 500 mL via INTRAVENOUS

## 2021-05-10 MED ORDER — VANCOMYCIN HCL 750 MG/150ML IV SOLN
750.0000 mg | INTRAVENOUS | Status: DC
  Administered 2021-05-11 – 2021-05-14 (×4): 750 mg via INTRAVENOUS
  Filled 2021-05-10 (×4): qty 150

## 2021-05-10 MED ORDER — HYDRALAZINE HCL 20 MG/ML IJ SOLN
5.0000 mg | INTRAMUSCULAR | Status: DC | PRN
  Administered 2021-05-11: 5 mg via INTRAVENOUS
  Filled 2021-05-10: qty 1

## 2021-05-10 NOTE — Assessment & Plan Note (Signed)
-  Hold Toprol XL ?-Will cover with prn hydralazine IV ?

## 2021-05-10 NOTE — ED Provider Notes (Signed)
?[place] DEPT ?[facility] Emergency Department ?Provider Note ?MRN:  [id number]  ?Arrival date & time: 05/10/21    ? ?Chief Complaint   ?Respiratory distress ?History of Present Illness   ?Kristina Wilkinson is a 88 y.o. year-old female with a history of diabetes, dementia presenting to the ED with chief complaint of respiratory distress. ? ?Concern for aspiration event at [facility] care facility.  Bag-valve-mask being utilized by EMS. ? ?Review of Systems  ?I was unable to obtain a full/accurate HPI, PMH, or ROS due to the patient's altered mental status. ? ?Patient's Health History   ? ?Past Medical History:  ?Diagnosis Date  ? Arthritis   ? Cellulitis 11/30/2018  ? CNS aneurysm 1983  ? s/p clipping  ? Diabetes mellitus type II   ? diet controlled   ? DVT (deep venous thrombosis) ([place]) 10/15  ? Elevated glucose   ? Hypertension   ? Insomnia   ? Knee pain   ? Left Dr. Meyer Cory  ? LBP (low back pain)   ? PN (peripheral neuropathy)   ? unknown etiol  ? Scalp laceration   ? Sepsis due to gram-negative UTI ([place]) 03/06/2019  ? Sepsis due to urinary tract infection ([place]) 03/05/2019  ? Skull fracture ([place]) 07/13/2016  ? Traumatic rhabdomyolysis ([place]) 11/30/2018  ?  ?Past Surgical History:  ?Procedure Laterality Date  ? CEREBRAL ANEURYSM REPAIR    ? initial repair in the 1980s, with second surgery in the 1990s  ? CHOLECYSTECTOMY    ? JOINT REPLACEMENT    ? KNEE ARTHROPLASTY  Apr 27, 2009  ? Left  ? TOTAL KNEE ARTHROPLASTY    ? TUBAL LIGATION    ?  ?Family History  ?Problem Relation Age of Onset  ? Arthritis Mother   ? Stroke Mother   ? Stroke Father   ? Arthritis-Osteo Son   ?  ?Social History  ? ?Socioeconomic History  ? Marital status: Widowed  ?  Spouse name: Not on file  ? Number of children: 1  ? Years of education: 25  ? Highest education level: 12th grade  ?Occupational History  ? Occupation: retired  ?  Employer: RETIRED  ?Tobacco Use  ? Smoking status: Former  ? Smokeless tobacco: Never  ? Tobacco comments:  ?   quit in April 27, 1981  ?Vaping Use  ? Vaping Use: Never used  ?Substance and Sexual Activity  ? Alcohol use: No  ?  Alcohol/week: 0.0 standard drinks  ? Drug use: No  ? Sexual activity: Not Currently  ?Other Topics Concern  ? Not on file  ?Social History Narrative  ? Lives alone in a one-story home.  Husband passed away in 1999-04-28.  ? 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  ? 1 son, not local.    ? Enjoys shopping  ? Still drives, uses a cane.    ? ?Social Determinants of Health  ? ?Financial Resource Strain: Not on file  ?Food Insecurity: Not on file  ?Transportation Needs: Not on file  ?Physical Activity: Not on file  ?Stress: Not on file  ?Social Connections: Not on file  ?Intimate Partner Violence: Not on file  ?  ? ?Physical Exam  ? ?Vitals:  ? 05/10/21 0600 05/10/21 0615  ?BP: 128/72 119/80  ?Pulse: 84 (!) 34  ?Resp: 20 18  ?Temp:    ?SpO2: 93% 98%  ?  ?CONSTITUTIONAL: Chronically ill-appearing, NAD ?NEURO/PSYCH: Confused, not oriented, mildly agitated, moves all extremities ?EYES:  eyes equal and reactive ?ENT/NECK:  no LAD,  no JVD ?CARDIO: Regular rate, well-perfused, normal S1 and S2 ?PULM:  CTAB no wheezing or rhonchi ?GI/GU:  non-distended, non-tender ?MSK/SPINE:  No gross deformities, no edema ?SKIN:  no rash, atraumatic ? ? ?*Additional and/or pertinent findings included in MDM below ? ?Diagnostic and Interventional Summary  ? ? EKG Interpretation ? ?Date/Time:  Wednesday May 10 2021 05:20:42 EDT ?Ventricular Rate:  88 ?PR Interval:  190 ?QRS Duration: 101 ?QT Interval:  412 ?QTC Calculation: 464 ?R Axis:   101 ?Text Interpretation: Right and left arm electrode reversal, interpretation assumes no reversal Sinus rhythm Supraventricular bigeminy Right axis deviation Repol abnrm suggests ischemia, diffuse leads Confirmed by Gerlene Fee [id number]) on 05/10/2021 6:11:07 AM ?  ? ?  ? ?Labs Reviewed  ?CBC - Abnormal; Notable for the following components:  ?    Result Value  ? WBC 11.3 (*)   ? All other  components within normal limits  ?COMPREHENSIVE METABOLIC PANEL - Abnormal; Notable for the following components:  ? Chloride 112 (*)   ? CO2 20 (*)   ? Glucose, Bld 152 (*)   ? BUN 27 (*)   ? Total Protein 6.0 (*)   ? Albumin 2.9 (*)   ? Total Bilirubin 1.4 (*)   ? All other components within normal limits  ?LACTIC ACID, PLASMA - Abnormal; Notable for the following components:  ? Lactic Acid, Venous 2.4 (*)   ? All other components within normal limits  ?I-STAT VENOUS BLOOD GAS, ED - Abnormal; Notable for the following components:  ? pCO2, Ven 33.0 (*)   ? pO2, Ven 145 (*)   ? TCO2 21 (*)   ? Acid-base deficit 4.0 (*)   ? HCT 35.0 (*)   ? Hemoglobin 11.9 (*)   ? All other components within normal limits  ?TROPONIN I (HIGH SENSITIVITY) - Abnormal; Notable for the following components:  ? Troponin I (High Sensitivity) 21 (*)   ? All other components within normal limits  ?CULTURE, BLOOD (ROUTINE X 2)  ?CULTURE, BLOOD (ROUTINE X 2)  ?PROTIME-INR  ?URINALYSIS, ROUTINE W REFLEX MICROSCOPIC  ?  ?DG Chest Port 1 View  ?Final Result  ?  ?CT HEAD WO CONTRAST (5MM)    (Results Pending)  ?CT Angio Chest Pulmonary Embolism (PE) W or WO Contrast    (Results Pending)  ?  ?Medications - No data to display  ? ?Procedures  /  Critical Care ?.Critical Care ?Performed by: Maudie Flakes, MD ?Authorized by: Maudie Flakes, MD  ? ?Critical care provider statement:  ?  Critical care time (minutes):  45 ?  Critical care was necessary to treat or prevent imminent or life-threatening deterioration of the following conditions:  Respiratory failure ?  Critical care was time spent personally by me on the following activities:  Development of treatment plan with patient or surrogate, discussions with consultants, evaluation of patient's response to treatment, examination of patient, ordering and review of laboratory studies, ordering and review of radiographic studies, ordering and performing treatments and interventions, pulse oximetry,  re-evaluation of patient's condition and review of old charts ? ?ED Course and Medical Decision Making  ?Initial Impression and Ddx ?Differential diagnosis includes aspiration event, pneumonia, sepsis, intracranial bleeding, UTI, metabolic disturbance.  Patient is full code per MOST form.  Awaiting labs, CT head. ? ?Past medical/surgical history that increases complexity of ED encounter: Dementia ? ?Interpretation of Diagnostics ?I personally reviewed the EKG and my interpretation is as follows: Atrial bigeminy ?   ?Labs do  not reveal significant blood count or electrolyte disturbance.  Troponin is minimally elevated.  Chest x-ray without acute process. ? ?Patient Reassessment and Ultimate Disposition/Management ?Patient remains fairly somnolent, does not seem to be at her baseline.  She is requiring 2 L nasal cannula which is a new oxygen requirement.  Given the reported respiratory distress and profound hypoxia, continued mild hypoxia, mild elevated troponin, will obtain CTA to exclude PE.  Suspect will need admission.  Signed out to oncoming provider. ? ?Patient management required discussion with the following services or consulting groups:  None ? ?Complexity of Problems Addressed ?Acute illness or injury that poses threat of life of bodily function ? ?Additional Data Reviewed and Analyzed ?Further history obtained from: ?EMS on arrival ? ?Additional Factors Impacting ED Encounter Risk ?Consideration of hospitalization ? ?Barth Kirks. Sedonia Small, MD ?[facility] ?[place] [email or web address] ? ?Final Clinical Impressions(s) / ED Diagnoses  ? ?  ICD-10-CM   ?1. Hypoxia  R09.02   ?  ?2. Altered mental status, unspecified altered mental status type  R41.82   ?  ?  ?ED Discharge Orders   ? ? None  ? ?  ?  ? ?Discharge Instructions Discussed with and Provided to Patient:  ? ?Discharge Instructions   ?None ?  ? ?  ?Maudie Flakes, MD ?05/10/21 [phone] ? ?

## 2021-05-10 NOTE — ED Provider Notes (Signed)
7:08 AM ?Patient signed out to me by previous ED physician. 88 yo female, advanced dementia, presenting in respirator distress. 80 % on room air as per EMS. Currently on 2 L [place]. Tachypenic. Lethargic more so then baseline.  ? ?Of note, palliative care saw patient two days ago on 3/27 and patient is now DNR.  ? ?Plan: CTPE pending ?Physical Exam  ?BP (!) 150/106   Pulse (!) 39   Temp 98.9 ?F (37.2 ?C) (Temporal)   Resp 20   SpO2 97%  ? ?Physical Exam ?Vitals and nursing note reviewed.  ?Constitutional:   ?   General: She is not in acute distress. ?   Appearance: She is well-developed.  ?HENT:  ?   Head: Normocephalic and atraumatic.  ?Eyes:  ?   Conjunctiva/sclera: Conjunctivae normal.  ?Cardiovascular:  ?   Rate and Rhythm: Normal rate and regular rhythm.  ?   Heart sounds: No murmur heard. ?Pulmonary:  ?   Effort: Pulmonary effort is normal. No respiratory distress.  ?   Breath sounds: Normal breath sounds.  ?Abdominal:  ?   Palpations: Abdomen is soft.  ?   Tenderness: There is no abdominal tenderness.  ?Musculoskeletal:     ?   General: No swelling.  ?   Cervical back: Neck supple.  ?Skin: ?   General: Skin is warm and dry.  ?   Capillary Refill: Capillary refill takes less than 2 seconds.  ?Neurological:  ?   Mental Status: She is alert.  ?Psychiatric:     ?   Mood and Affect: Mood normal.  ? ? ?Procedures  ?Procedures ? ?ED Course / MDM  ?  ?Medical Decision Making ?Amount and/or Complexity of Data Reviewed ?Labs: ordered. ?Radiology: ordered. ?ECG/medicine tests: ordered. ? ?Risk ?Prescription drug management. ? ? ?CTPE demonstrates no pulmonary embolism. ? ?Findings concerning for possible aspiration pneumonia.    Oxygenation stable on 2 L nasal cannula at this time. No fever.  Minimal leukocytosis at 11.3. Lactic acidosis of 2.4. 500 cc IVF bolus given.  CT chest reading " medial basal segment right lower lobe consolidation versus atelectasis.  Trace retained secretion in airway does raise suspicion for  possible aspiration".  Head of bed elevated. Humidified oxygen order placed.  ? ?Penicillin allergy hives/swelling. Vancomycin, cefepime, flagyl started for possible aspiration pneumonia with hypoxia and recent hospitalization int he last 9 days.  ? ?Patient accepted for admission by Dr. Lorin Mercy. ? ? ?  ?Lianne Cure, DO ?25/85/27 1006 ? ?

## 2021-05-10 NOTE — Evaluation (Signed)
Clinical/Bedside Swallow Evaluation ?Patient Details  ?Name: Kristina Wilkinson ?MRN: [id number] ?Date of Birth: 1927/06/30 ? ?Today's Date: 05/10/2021 ?Time: SLP Start Time (ACUTE ONLY): 5056 SLP Stop Time (ACUTE ONLY): 1800 ?SLP Time Calculation (min) (ACUTE ONLY): 20 min ? ?Past Medical History:  ?Past Medical History:  ?Diagnosis Date  ? Arthritis   ? Cellulitis 11/30/2018  ? CNS aneurysm 1983  ? s/p clipping  ? Diabetes mellitus type II   ? diet controlled   ? DVT (deep venous thrombosis) ([place]) 10/15  ? Elevated glucose   ? Hypertension   ? Insomnia   ? Knee pain   ? Left Dr. Meyer Cory  ? LBP (low back pain)   ? PN (peripheral neuropathy)   ? unknown etiol  ? Scalp laceration   ? Sepsis due to gram-negative UTI ([place]) 03/06/2019  ? Sepsis due to urinary tract infection ([place]) 03/05/2019  ? Skull fracture ([place]) 07/13/2016  ? Traumatic rhabdomyolysis ([place]) 11/30/2018  ? ?Past Surgical History:  ?Past Surgical History:  ?Procedure Laterality Date  ? CEREBRAL ANEURYSM REPAIR    ? initial repair in the 1980s, with second surgery in the 1990s  ? CHOLECYSTECTOMY    ? JOINT REPLACEMENT    ? KNEE ARTHROPLASTY  04/2009  ? Left  ? TOTAL KNEE ARTHROPLASTY    ? TUBAL LIGATION    ? ?HPI:  ?Patient is a 88 y.o. female with PMH: DM, HTN, dementia, afib not taking Eliquis, GERD, CNS aneurysm s/p clipping, DVT, who presented to ED with SOB, ?aspiration event. She was admitted from 2/14-2/18 with AMS secondary to dehydration and again 3/18-3/21 with similar presentation. She was seen by Palliative care on 3/27 and is DNR. During admission this past February, she was evaluated at bedside for swallow by SLP on 2/17 and aside from some recommendations for optimal position for PO intake, no significant swallowing issues identified and recommendation was for regular solids, thin liquids.  ?  ?Assessment / Plan / Recommendation  ?Clinical Impression ? Patient presenting with clinical s/s of dysphagia as per this bedside/clinical swallow evaluation.  Patient was lethargic and did require mod-max cues for alertness but she was able to follow commands and actively respond to taking small spoon sips of water. She exhibited multiple swallows, suspeced swallow initiation delay and delayed throat clearing. She was seen by SLP for this same type of bedside swallow evaluation during previous two admissions (March 2023, February 2023) and most recent recommending Dys 3 (mechanical soft) solids and thin liquids. SLP spoke with RN who stated that patient has seemed to have a decline in her alertness even as compared to earlier today and that background as she has heard it is that patient is able to feed self and complete ADL's without much difficulty. SLP is recommending continue NPO status but allow patient to have small cup sips of water if she is fully awake and alert, cease if excessive coughing. SLP will f/u in AM next date for PO trials. ?SLP Visit Diagnosis: Dysphagia, unspecified (R13.10) ?   ?Aspiration Risk ? Mild aspiration risk  ?  ?Diet Recommendation NPO;Other (Comment) (water via spoon or small cup sips PRN if patient fully awake/alert)  ? ?Liquid Administration via: Cup;Spoon ?Medication Administration: Crushed with puree ?Supervision: Full supervision/cueing for compensatory strategies ?Compensations: Slow rate;Small sips/bites ?Postural Changes: Seated upright at 90 degrees  ?  ?Other  Recommendations Oral Care Recommendations: Oral care BID;Staff/trained caregiver to provide oral care   ? ?Recommendations for follow up therapy are one  component of a multi-disciplinary discharge planning process, led by the attending physician.  Recommendations may be updated based on patient status, additional functional criteria and insurance authorization. ? ?Follow up Recommendations Skilled nursing-short term rehab (<3 hours/day)  ? ? ?  ?Assistance Recommended at Discharge Frequent or constant Supervision/Assistance  ?Functional Status Assessment Patient has had a  recent decline in their functional status and demonstrates the ability to make significant improvements in function in a reasonable and predictable amount of time.  ?Frequency and Duration min 2x/week  ?1 week;2 weeks ?  ?   ? ?Prognosis Prognosis for Safe Diet Advancement: Good  ? ?  ? ?Swallow Study   ?General Date of Onset: 05/10/21 ?HPI: Patient is a 88 y.o. female with PMH: DM, HTN, dementia, afib not taking Eliquis, GERD, CNS aneurysm s/p clipping, DVT, who presented to ED with SOB, ?aspiration event. She was admitted from 2/14-2/18 with AMS secondary to dehydration and again 3/18-3/21 with similar presentation. She was seen by Palliative care on 3/27 and is DNR. During admission this past February, she was evaluated at bedside for swallow by SLP on 2/17 and aside from some recommendations for optimal position for PO intake, no significant swallowing issues identified and recommendation was for regular solids, thin liquids. ?Type of Study: Bedside Swallow Evaluation ?Previous Swallow Assessment: BSE Feb 2023 and March 2023 ?Diet Prior to this Study: NPO ?Temperature Spikes Noted: No ?Respiratory Status: Nasal cannula ?History of Recent Intubation: No ?Behavior/Cognition: Cooperative;Requires cueing;Lethargic/Drowsy ?Oral Cavity Assessment: Within Functional Limits ?Oral Care Completed by SLP: Yes ?Oral Cavity - Dentition: Adequate natural dentition ?Self-Feeding Abilities: Total assist ?Patient Positioning: Upright in bed ?Baseline Vocal Quality: Low vocal intensity ?Volitional Cough: Weak ?Volitional Swallow: Able to elicit  ?  ?Oral/Motor/Sensory Function Overall Oral Motor/Sensory Function: Other (comment) (patient unable to follow all commands due to lethargy but appears [facility])   ?Ice Chips     ?Thin Liquid Thin Liquid: Impaired ?Presentation: Spoon ?Pharyngeal  Phase Impairments: Multiple swallows;Suspected delayed Swallow;Throat Clearing - Delayed  ?  ?Nectar Thick     ?Honey Thick     ?Puree Puree: Not  tested   ?Solid ? ? ?  Solid: Not tested  ? ?  ?Sonia Baller, MA, CCC-SLP ?Speech Therapy ? ? ? ?

## 2021-05-10 NOTE — Assessment & Plan Note (Signed)
-  SIRS criteria in this patient includes: Leukocytosis, tachypnea, hypoxia (bag-valve mask by EMS on arrival)  ?-Patient has evidence of acute organ failure with elevated lactate >2; encephalopathy that is not easily explained by another condition. ?-While awaiting blood cultures, this appears to be a preseptic condition. ?-Sepsis protocol initiated ?-Suspected source is aspiration PNA ?-Blood and urine cultures pending ?-Will admit due to: hypoxemia; AMS that is severe or persistent ?-Treat with IV Cefepime/Vanc/Flagyl for suspected aspiration PNA ?-Will trend lactate to ensure improvement ?-Will order lower respiratory tract procalcitonin level.  Antibiotics would not be indicated for PCT <0.1 and probably should not be used for < 0.25.  >0.5 indicates infection and >>0.5 indicates more serious disease.  As the procalcitonin level normalizes, it will be reasonable to consider de-escalation of antibiotic coverage. ?

## 2021-05-10 NOTE — Assessment & Plan Note (Signed)
-  Recent A1c was 6.1, indicating good control ?-She is diet controlled ?-Will hold treatment for now ?

## 2021-05-10 NOTE — Assessment & Plan Note (Signed)
-  AMS is likely related to sepsis, aspiration PNA ?-Her usual is some speech with self-feeding ?-Will monitor for now, NPO until able to pass swallow evaluation ?

## 2021-05-10 NOTE — Assessment & Plan Note (Addendum)
-  I have discussed code status with the patient's NOK and the patient would not desire resuscitation and would prefer to die a natural death should that situation arise. ?-She will need a gold out of facility DNR form at the time of discharge ?-Her prior MOST form was for full spectrum of care but she has declined significantly since then ?

## 2021-05-10 NOTE — Progress Notes (Signed)
Pharmacy Antibiotic Note ? ?Kristina Wilkinson is a 88 y.o. female admitted on 05/10/2021 with pneumonia.  Pharmacy has been consulted for vancomycin and cefepime dosing. Pt is afebrile and WBC is slightly elevated. Lactic acid is >2. Scr is WNL.  ? ?Plan: ?Vancomycin 1g IV x 1 then '750mg'$  IV Q24H ?Cefepime 2gm IV Q12H ?F/u renal fxn, C&S, clinical status and peak/trough at [facility] ?  ? ?Temp (24hrs), Avg:98.9 ?F (37.2 ?C), Min:98.9 ?F (37.2 ?C), Max:98.9 ?F (37.2 ?C) ? ?Recent Labs  ?Lab 05/10/21 ?4332  ?WBC 11.3*  ?CREATININE 0.87  ?LATICACIDVEN 2.4*  ?  ?Estimated Creatinine Clearance: 38.1 mL/min (by C-G formula based on SCr of 0.87 mg/dL).   ? ?Allergies  ?Allergen Reactions  ? Gabapentin Itching and Other (See Comments)  ?  Made the legs "go numb" and "allergic," per [facility]  ? Other Other (See Comments)  ?  "MD mixed a blood pressure medication with the Clinoril I was taking and I couldn't walk" "made me achy and sick"  ? Penicillins Other (See Comments) and Itching  ?  Has patient had a PCN reaction causing immediate rash, facial/tongue/throat swelling, SOB or lightheadedness with hypotension: Unk- exact reaction not recalled, but is allergic ?Has patient had a PCN reaction causing severe rash involving mucus membranes or skin necrosis: Unk ?Has patient had a PCN reaction that required hospitalization: Unk ?Has patient had a PCN reaction occurring within the last 10 years: Yes ?If all of the above answers are "NO", then may proceed with Cephalosporin use. ? ?Itching and rash  ? Pregabalin Other (See Comments)  ?  "Made me pass out" and "made the limbs go numb"- "allergic," per [facility]  ? Vit D-Vit E-Safflower Oil Other (See Comments)  ?  "Made me achy and sick"  ? ? ?Antimicrobials this admission: ?Vanc 3/29>> ?Cefepime 3/29>> ?Flagyl 3/29>> ? ?Dose adjustments this admission: ?N/A ? ?Microbiology results: ?Pending ? ?Thank you for allowing pharmacy to be a part of this patient?s care. ? ?Carlinda Ohlson, Rande Lawman ?05/10/2021 9:07  AM ? ?

## 2021-05-10 NOTE — Assessment & Plan Note (Signed)
-  She is usually rate controlled with Toprol XL - holding while NPO ?-She is also on Eliquis - holding while NPO ?-She is on DVT prevention Lovenox for now, will change to full afib dosing ?

## 2021-05-10 NOTE — ED Triage Notes (Signed)
Pt arrived via [facility] with c/c of possible Aspiration from [facility]. Per EMS pt was sick as of yesterday and was found this AM by staff (travelers) foaming at the mouth. Pt had sats in the 80% while on RA. ? ?80 % RA --> 90 % NRB --> 97% BVM, 90-101 HR, CBG 187 ? ?100 NSS ?

## 2021-05-10 NOTE — H&P (Signed)
?History and Physical  ? ? ?Patient: Kristina Wilkinson GUR:[id number] DOB: Jul 29, 1927 ?DOA: 05/10/2021 ?DOS: the patient was seen and examined on 05/10/2021 ?PCP: Pcp, No  ?Patient coming from: SNF [facility]; NOK: [name], [place], [phone] ? ? ?Chief Complaint: SOB ? ?HPI: Kristina Wilkinson is a 88 y.o. female with medical history significant of DM; HTN; dementia; and afib not taking Eliquis presenting with SOB, ?aspiration event.  She was previously admitted from 2/14-18 with AMS 2* to dehydration and again from 3/18-21 with similar presentation.  She was seen by palliative care on 3/27 and is DNR.  She is obtunded and unable to provide history. ? ?I called her POA and spoke with him.  He was not aware that she is at [facility].  He last saw her a week or two ago.  She is usually fairly alert, often confused.  She does not have problems usually with eating/drinking.  She is non-ambulatory.  She fell at home in October and would not let the medics transport her for 4 days, unable to get up.  She "has chased the therapists out" and hasn't had therapy at all since late fall.  She ended up in SNF after that fall.  He does think she has deteriorated somewhat recently.  He had been told she was in a good mood this week.  She never had cognitive problems when she lived at home.  She would like for her to be treated. ? ? ? ?ER Course:  Acute respiratory failure with hypoxia, likely due to aspiration event.  H/o dementia, worsening from baseline.  Multiple recent events.  EMS was bagging on arrival, currently on 2L.  CTA negative for PE.  Given Vanc/Cefepime/Flagyl.  Lactate 2.4.   ? ? ? ? ?Review of Systems: unable to review all systems due to the inability of the patient to answer questions. ?Past Medical History:  ?Diagnosis Date  ? Arthritis   ? Cellulitis 11/30/2018  ? CNS aneurysm 1983  ? s/p clipping  ? Diabetes mellitus type II   ? diet controlled   ? DVT (deep venous thrombosis) ([place]) 10/15  ? Elevated glucose   ?  Hypertension   ? Insomnia   ? Knee pain   ? Left Dr. Meyer Cory  ? LBP (low back pain)   ? PN (peripheral neuropathy)   ? unknown etiol  ? Scalp laceration   ? Sepsis due to gram-negative UTI ([place]) 03/06/2019  ? Sepsis due to urinary tract infection ([place]) 03/05/2019  ? Skull fracture ([place]) 07/13/2016  ? Traumatic rhabdomyolysis ([place]) 11/30/2018  ? ?Past Surgical History:  ?Procedure Laterality Date  ? CEREBRAL ANEURYSM REPAIR    ? initial repair in the 1980s, with second surgery in the 1990s  ? CHOLECYSTECTOMY    ? JOINT REPLACEMENT    ? KNEE ARTHROPLASTY  04/2009  ? Left  ? TOTAL KNEE ARTHROPLASTY    ? TUBAL LIGATION    ? ?Social History:  reports that she has quit smoking. She has never used smokeless tobacco. She reports that she does not drink alcohol and does not use drugs. ? ?Allergies  ?Allergen Reactions  ? Gabapentin Itching and Other (See Comments)  ?  Made the legs "go numb" and "allergic," per [facility]  ? Other Other (See Comments)  ?  "MD mixed a blood pressure medication with the Clinoril I was taking and I couldn't walk" "made me achy and sick"  ? Penicillins Other (See Comments) and Itching  ?  Has  patient had a PCN reaction causing immediate rash, facial/tongue/throat swelling, SOB or lightheadedness with hypotension: Unk- exact reaction not recalled, but is allergic ?Has patient had a PCN reaction causing severe rash involving mucus membranes or skin necrosis: Unk ?Has patient had a PCN reaction that required hospitalization: Unk ?Has patient had a PCN reaction occurring within the last 10 years: Yes ?If all of the above answers are "NO", then may proceed with Cephalosporin use. ? ?Itching and rash  ? Pregabalin Other (See Comments)  ?  "Made me pass out" and "made the limbs go numb"- "allergic," per [facility]  ? Vit D-Vit E-Safflower Oil Other (See Comments)  ?  "Made me achy and sick"  ? ? ?Family History  ?Problem Relation Age of Onset  ? Arthritis Mother   ? Stroke Mother   ? Stroke Father   ? Arthritis-Osteo  Son   ? ? ?Prior to Admission medications   ?Medication Sig Start Date End Date Taking? Authorizing Provider  ?metoprolol succinate (TOPROL-XL) 25 MG 24 hr tablet Take 0.5 tablets (12.5 mg total) by mouth daily. 05/03/21  Yes Nolberto Hanlon, MD  ?polyethylene glycol (MIRALAX / GLYCOLAX) 17 g packet Take 17 g by mouth 2 (two) times daily. 10/10/20  Yes Sheikh, Omair Latif, DO  ?senna (SENOKOT) 8.6 MG TABS tablet Take 8.6 mg by mouth in the morning and at bedtime.   Yes [provider]  ?vitamin C (ASCORBIC ACID) 500 MG tablet Take 500 mg by mouth 2 (two) times daily.   Yes [provider]  ?acetaminophen (TYLENOL) 325 MG tablet Take 2 tablets (650 mg total) by mouth every 6 (six) hours as needed for mild pain (or Fever >/= 101). 10/10/20   Sheikh, Georgina Quint Latif, DO  ?apixaban (ELIQUIS) 2.5 MG TABS tablet Take 1 tablet (2.5 mg total) by mouth 2 (two) times daily. 05/02/21   Nolberto Hanlon, MD  ?bisacodyl (DULCOLAX) 10 MG suppository Place 1 suppository (10 mg total) rectally daily as needed for moderate constipation. 10/10/20   Raiford Noble Latif, DO  ?busPIRone (BUSPAR) 5 MG tablet Take 1 tablet (5 mg total) by mouth 2 (two) times daily. 05/02/21   Nolberto Hanlon, MD  ?leptospermum manuka honey (MEDIHONEY) PSTE paste Apply 1 application. topically daily. 05/03/21   Nolberto Hanlon, MD  ?Multiple Vitamins-Minerals (DECUBI-VITE) CAPS Take 1 capsule by mouth daily. 04/13/21   [provider]  ?polyvinyl alcohol (LIQUIFILM TEARS) 1.4 % ophthalmic solution Place 1 drop into both eyes as needed for dry eyes. 10/10/20   Raiford Noble Latif, DO  ?sodium phosphate (FLEET) 7-19 GM/118ML ENEM Place 133 mLs (1 enema total) rectally once as needed for severe constipation. 05/02/21   Nolberto Hanlon, MD  ?UNABLE TO FIND Take 90 mLs by mouth 2 (two) times daily. Med Name: Medpass 2.0 , Nutritional supplement    [provider]  ? ? ?Physical Exam: ?Vitals:  ? 05/10/21 1145 05/10/21 1200 05/10/21 1330 05/10/21 1430  ?BP:  (!) 154/110 (!) 161/71 (!) 149/90 (!) 168/82  ?Pulse: 66 62 73 67  ?Resp: '18 17 17 16  '$ ?Temp:      ?TempSrc:      ?SpO2: 100% 100% 100% 100%  ? ?General:  Appears obtunded, no significant response to stimulation ?Eyes:   normal lids ?ENT:  grossly normal lips & tongue ?Neck:  no LAD, masses or thyromegaly ?Cardiovascular:  Irregularly irregular, no m/r/g. No LE edema.  ?Respiratory:   CTA bilaterally with no wheezes/rales/rhonchi.  Normal respiratory effort. ?Abdomen:  soft,  NT, ND ?Skin:  no rash or induration seen on limited exam ?Musculoskeletal:   no bony abnormality ?Psychiatric:  obtunded, essentially unresponsive ?Neurologic:  unable to perform ? ? ?Radiological Exams on Admission: ?Independently reviewed - see discussion in A/P where applicable ? ?CT HEAD WO CONTRAST (5MM) ? ?Result Date: 05/10/2021 ?CLINICAL DATA:  Delirium. EXAM: CT HEAD WITHOUT CONTRAST TECHNIQUE: Contiguous axial images were obtained from the base of the skull through the vertex without intravenous contrast. RADIATION DOSE REDUCTION: This exam was performed according to the departmental dose-optimization program which includes automated exposure control, adjustment of the mA and/or kV according to patient size and/or use of iterative reconstruction technique. COMPARISON:  03/28/2021 FINDINGS: Brain: There is no evidence for acute hemorrhage, hydrocephalus, mass lesion, or abnormal extra-axial fluid collection. No definite CT evidence for acute infarction. Patchy low attenuation in the deep hemispheric and periventricular white matter is nonspecific, but likely reflects chronic microvascular ischemic demyelination. Small chronic right frontal lobe infarct is similar to prior. Old left cerebellar infarct is stable Vascular: No hyperdense vessel or unexpected calcification. Skull: No evidence for fracture. No worrisome lytic or sclerotic lesion. Surgical changes noted in the occipital bone with aneurysm clip noted in the region of the  foramen magnum. Sinuses/Orbits: The visualized paranasal sinuses and mastoid air cells are clear. Visualized portions of the globes and intraorbital fat are unremarkable. Other: None. IMPRESSION: 1. No acute

## 2021-05-10 NOTE — Assessment & Plan Note (Signed)
-  Patient with acute onset of SOB this AM ?-She is at significant risk for aspiration given her dementia ?-Imaging is c/w aspiration PNA ?-For now, NPO and will need swallow evaluation prior to diet ?-If she has dyscoordinated swallow associated with dementia, there may need to be discussion regarding PEG tube vs. Palliative care ?

## 2021-05-10 NOTE — ED Notes (Signed)
Bedding and brief changed. Mepalex pad applied to pt sacrum for redness and skin breakdown  ?

## 2021-05-10 NOTE — Assessment & Plan Note (Signed)
-  Progressive dementia, per discussion with family ?-She has been on Buspar ?-Will hold and use prn Haldol ?-Delirium precautions ordered ?

## 2021-05-11 DIAGNOSIS — A419 Sepsis, unspecified organism: Secondary | ICD-10-CM | POA: Diagnosis not present

## 2021-05-11 DIAGNOSIS — J189 Pneumonia, unspecified organism: Secondary | ICD-10-CM | POA: Diagnosis not present

## 2021-05-11 LAB — BLOOD CULTURE ID PANEL (REFLEXED) - BCID2

## 2021-05-11 LAB — CBC
HCT: 33.9 % — ABNORMAL LOW (ref 36.0–46.0)
Hemoglobin: 11 g/dL — ABNORMAL LOW (ref 12.0–15.0)
MCH: 29.1 pg (ref 26.0–34.0)
MCHC: 32.4 g/dL (ref 30.0–36.0)
MCV: 89.7 fL (ref 80.0–100.0)
Platelets: 154 10*3/uL (ref 150–400)
RBC: 3.78 MIL/uL — ABNORMAL LOW (ref 3.87–5.11)
RDW: 15 % (ref 11.5–15.5)
WBC: 7 10*3/uL (ref 4.0–10.5)
nRBC: 0 % (ref 0.0–0.2)

## 2021-05-11 LAB — BASIC METABOLIC PANEL
Anion gap: 8 (ref 5–15)
BUN: 23 mg/dL (ref 8–23)
CO2: 21 mmol/L — ABNORMAL LOW (ref 22–32)
Calcium: 9.1 mg/dL (ref 8.9–10.3)
Chloride: 116 mmol/L — ABNORMAL HIGH (ref 98–111)
Creatinine, Ser: 0.67 mg/dL (ref 0.44–1.00)
GFR, Estimated: >60 mL/min (ref >60)
Glucose, Bld: 127 mg/dL — ABNORMAL HIGH (ref 70–99)
Potassium: 3.1 mmol/L — ABNORMAL LOW (ref 3.5–5.1)
Sodium: 145 mmol/L (ref 135–145)

## 2021-05-11 LAB — GLUCOSE, CAPILLARY: Glucose-Capillary: 72 mg/dL (ref 70–99)

## 2021-05-11 LAB — PROCALCITONIN: Procalcitonin: <0.1 ng/mL

## 2021-05-11 MED ORDER — DEXTROSE-NACL 5-0.9 % IV SOLN: INTRAVENOUS | Status: DC

## 2021-05-11 MED ORDER — MEDIHONEY WOUND/BURN DRESSING EX PSTE
1.0000 "application " | PASTE | Freq: Every day | CUTANEOUS | Status: DC
  Administered 2021-05-11 – 2021-05-17 (×7): 1 via TOPICAL
  Filled 2021-05-11: qty 44

## 2021-05-11 MED ORDER — POTASSIUM CHLORIDE 10 MEQ/100ML IV SOLN
10.0000 meq | INTRAVENOUS | Status: AC
  Administered 2021-05-11 (×3): 10 meq via INTRAVENOUS
  Filled 2021-05-11 (×3): qty 100

## 2021-05-11 NOTE — TOC Progression Note (Signed)
Transition of Care (TOC) - Initial/Assessment Note  ? ? ?Patient Details  ?Name: Kristina Wilkinson ?MRN: [id number] ?Date of Birth: [birth year] ? ?Transition of Care (TOC) CM/SW Contact:    ?Paulene Floor Peretz Thieme, LCSWA ?Phone Number: ?05/11/2021, 8:31 AM ? ?Clinical Narrative:                 ?CSW contacted Janie with [facility] and confirmed that the patient is LTC with the facility.  The patient can return when medically ready. ? ?TOC will continue to follow.   ? ?  ?  ? ? ?Patient Goals and CMS Choice ?  ?  ?  ? ?Expected Discharge Plan and Services ?  ?  ?  ?  ?  ?                ?  ?  ?  ?  ?  ?  ?  ?  ?  ?  ? ?Prior Living Arrangements/Services ?  ?  ?  ?       ?  ?  ?  ?  ? ?Activities of Daily Living ?  ?  ? ?Permission Sought/Granted ?  ?  ?   ?   ?   ?   ? ?Emotional Assessment ?  ?  ?  ?  ?  ?  ? ?Admission diagnosis:  Lactic acidosis [E87.20] ?Acute respiratory failure with hypoxia ([place]) [J96.01] ?Altered mental status, unspecified altered mental status type [R41.82] ?Sepsis due to pneumonia ([place]) [J18.9, A41.9] ?Aspiration pneumonia of right lower lobe, unspecified aspiration pneumonia type ([place]) [J69.0] ?Patient Active Problem List  ? Diagnosis Date Noted  ? Sepsis due to pneumonia ([place]) 05/10/2021  ? Aspiration pneumonia ([place]) 05/10/2021  ? Acute metabolic encephalopathy 30/86/5784  ? DNR (do not resuscitate) 05/10/2021  ? Dementia with behavioral disturbance 05/08/2021  ? Palliative care encounter 05/08/2021  ? Moderate protein-calorie malnutrition ([place]) 05/08/2021  ? Constipation 04/30/2021  ? Hypothermia 04/29/2021  ? Acute encephalopathy 03/29/2021  ? Pressure injury of skin 03/01/2021  ? Sepsis secondary to UTI ([place]) 02/28/2021  ? Dementia without behavioral disturbance ([place]) 02/28/2021  ? Normocytic anemia 02/28/2021  ? Atrial fibrillation, chronic ([place]) 09/25/2020  ? Restless leg syndrome 03/14/2019  ? Falls 03/05/2019  ? Open toe wound 12/01/2018  ? Transaminitis 11/30/2018  ? Abnormal urinalysis  11/30/2018  ? Leukocytosis 11/30/2018  ? Hypokalemia 11/30/2018  ? Chronic left shoulder pain 04/03/2016  ? DM2 (diabetes mellitus, type 2) ([place]) 06/27/2015  ? Hearing loss 06/21/2015  ? Advance care planning 12/17/2014  ? Palpitations 07/06/2014  ? Pain in joint, ankle and foot 04/20/2014  ? DVT, lower extremity ([place]) 11/19/2013  ? Edema 10/20/2013  ? Headache(784.0) 10/20/2013  ? Left leg swelling 10/20/2013  ? Aneurysm of basilar artery (H[facility]) 10/20/2013  ? Bradycardia 10/20/2013  ? Vitamin B12 deficiency 07/16/2013  ? Paresthesia of both feet 05/17/2013  ? TMJ arthritis 09/11/2012  ? Ear pain 09/11/2012  ? History of transfusion of packed red blood cells 09/11/2012  ? Spinal stenosis 04/13/2011  ? Well adult exam 01/23/2011  ? Cholecystitis with cholelithiasis, s/p lap chole 11/10/2010 12/01/2010  ? Shoulder pain, right 07/25/2010  ? TOBACCO USE, QUIT 03/09/2009  ? KNEE PAIN 05/18/2008  ? PN (peripheral neuropathy) 08/19/2007  ? LEG PAIN 08/19/2007  ? Vitamin D deficiency 02/17/2007  ? INSOMNIA, PERSISTENT 02/17/2007  ? HTN (hypertension) 02/17/2007  ? GERD 02/17/2007  ? Osteoarthritis 02/17/2007  ? LOW BACK PAIN 02/17/2007  ? Hyperglycemia  01/28/2007  ? UNSPECIFIED OSTEOPOROSIS 01/28/2007  ? PRECIPITOUS DROP IN HEMATOCRIT 01/28/2007  ? ?PCP:  Pcp, No ?Pharmacy:   ?[facility] [place] [address] ?Phone: [phone] Fax: [phone] ? ? ? ? ?Social Determinants of Health (SDOH) Interventions ?  ? ?Readmission Risk Interventions ? ?  03/30/2021  ?  2:14 PM 03/01/2021  ?  2:52 PM  ?Readmission Risk Prevention Plan  ?Transportation Screening Complete Complete  ?PCP or Specialist Appt within 3-5 Days  Complete  ?[place] or Home Care Consult  Complete  ?Social Work Consult for [place] Planning/Counseling  Complete  ?Palliative Care Screening  Not Applicable  ?Medication Review Press photographer) Complete Complete  ?PCP or Specialist  appointment within 3-5 days of discharge Complete   ?[place] or Home Care Consult Complete   ?SW Recovery Care/Counseling Consult Complete   ?Palliative Care Screening Not Applicable   ?Skilled Nursing Facility Complete   ? ? ? ?

## 2021-05-11 NOTE — Progress Notes (Signed)
Speech Language Pathology Treatment: Dysphagia  ?Patient Details ?Name: Kristina Wilkinson ?MRN: [id number] ?DOB: 21-Jan-1928 ?Today's Date: 05/11/2021 ?Time: 1275-1700 ?SLP Time Calculation (min) (ACUTE ONLY): 20 min ? ?Assessment / Plan / Recommendation ?Clinical Impression ? Patient seen by SLP for skilled treatment session focused on dysphagia goals. Patient was asleep and difficult to wake up but when SLP elevating HOB and repositioned patient slightly, she started moaning. She closed eyes for a bit more but then became alert and participatory. She drank thin liquids (water) via straw sips and had 4-5 1/2 spoon bites of puree (applesauce). She would often grimace slightly when PO's presented but did not start refusing until she had a few bites and sips. Patient exhibited suspected delayed swallow initiation and quesitonable oral holding/delay but no overt s/s aspiration or penetration and oral cavity clear of PO's after initial swallow. SLP is recommending initiate Dys 1 solids, thin liquids and will follow for patient's toleration and ability to upgrade solid textures. ?  ?HPI HPI: Patient is a 88 y.o. female with PMH: DM, HTN, dementia, afib not taking Eliquis, GERD, CNS aneurysm s/p clipping, DVT, who presented to ED with SOB, ?aspiration event. She was admitted from 2/14-2/18 with AMS secondary to dehydration and again 3/18-3/21 with similar presentation. She was seen by Palliative care on 3/27 and is DNR. During admission this past February, she was evaluated at bedside for swallow by SLP on 2/17 and aside from some recommendations for optimal position for PO intake, no significant swallowing issues identified and recommendation was for regular solids, thin liquids. ?  ?   ?SLP Plan ? Continue with current plan of care ? ?  ?  ?Recommendations for follow up therapy are one component of a multi-disciplinary discharge planning process, led by the attending physician.  Recommendations may be updated based on patient  status, additional functional criteria and insurance authorization. ?  ? ?Recommendations  ?Diet recommendations: Dysphagia 1 (puree);Thin liquid ?Liquids provided via: Cup;Straw ?Medication Administration: Crushed with puree ?Supervision: Full supervision/cueing for compensatory strategies;Staff to assist with self feeding ?Compensations: Slow rate;Small sips/bites ?Postural Changes and/or Swallow Maneuvers: Seated upright 90 degrees  ?   ?    ?   ? ? ? ? Oral Care Recommendations: Oral care BID;Staff/trained caregiver to provide oral care ?Follow Up Recommendations: Skilled nursing-short term rehab (<3 hours/day) ?Assistance recommended at discharge: Frequent or constant Supervision/Assistance ?SLP Visit Diagnosis: Dysphagia, unspecified (R13.10) ?Plan: Continue with current plan of care ? ? ? ? ?  ?  ? ? ?Sonia Baller, MA, CCC-SLP ?Speech Therapy ? ?

## 2021-05-11 NOTE — Consult Note (Signed)
WOC Nurse Consult Note: ?Patient receiving care in [facility] 5M16 ?Reason for Consult:BL heels and sacrum ?Wound type: ?The buttocks is red but blanchable with no open wounds noted. ?Small open unstageable pressure injuries on both heels. ?Pressure Injury POA: Yes ?Measurement:  ?Left heel: 1.5 x 0.5 x 0.1 ?Right heel: 3.5 x 3 x 0.2 ?Wound bed: 100% yellow ?Drainage (amount, consistency, odor) tan brown drainage on dressing.  ?Periwound: intact ?Dressing procedure/placement/frequency: ?Continue sacral foam dressing, assessing under the dressing daily and change the foam dressing every 3 days or PRN soiling. ?Cleanse the heel wounds with NS. Pat dry then apply a nickel thick layer of MediHoney directly to the wound or onto a dressing. Make certain that the dressing covers the entire wound base, but not in contact with the peri-wound skin, then cover with gauze and secure with a few turns of Kerlix. Change dressing daily. Continue bilateral Prevalon boots and turn patient every 2-4 hours to offload the sacral area.  ? ?Monitor the wound area(s) for worsening of condition such as: ?Signs/symptoms of infection, increase in size, development of or worsening of odor, ?development of pain, or increased pain at the affected locations.   ?Notify the medical team if any of these develop. ? ?Thank you for the consult. [facility] nurse will not follow at this time.   ?Please re-consult the [facility] team if needed. ? ?Cathlean Marseilles. Tamala Julian, MSN, RN, CMSRN, AGCNS, WTA ?Wound Treatment Associate ?Pager [phone]   ? ? ? ?  ? ?  ?

## 2021-05-11 NOTE — Evaluation (Signed)
Physical Therapy Evaluation & Discharge ?Patient Details ?Name: Kristina Wilkinson ?MRN: [id number] ?DOB: 04/18/1927 ?Today's Date: 05/11/2021 ? ?History of Present Illness ? Pt is a 88 y/o female admitted from SNF due to acute respiratory failure with hypoxia, likely due to aspiration event. PMH: DM, dementia, HTN, afib  ?Clinical Impression ? Patient admitted with the above findings. Per chart, patient is LTC resident at [facility]) and non-ambulatory. Patient is currently at her baseline and is totalA for attempts at sitting EOB with patient resisting movement. R LE internally rotated at rest and patient yelling out in pain with attempts to place R LE in more neutral position. No further skilled PT needs identified acutely. Recommend d/c back to SNF as LTC resident.    ?   ? ?Recommendations for follow up therapy are one component of a multi-disciplinary discharge planning process, led by the attending physician.  Recommendations may be updated based on patient status, additional functional criteria and insurance authorization. ? ?Follow Up Recommendations Other (comment) (return to SNF as LTC resident which is patient's PLOF) ? ?  ?Assistance Recommended at Discharge Frequent or constant Supervision/Assistance  ?Patient can return home with the following ?   ? ?  ?Equipment Recommendations None recommended by PT  ?Recommendations for Other Services ?    ?  ?Functional Status Assessment Patient has not had a recent decline in their functional status  ? ?  ?Precautions / Restrictions Precautions ?Precautions: Fall;Other (comment) ?Precaution Comments: hx of agitation/combative ?Restrictions ?Weight Bearing Restrictions: No  ? ?  ? ?Mobility ? Bed Mobility ?Overal bed mobility: Needs Assistance ?  ?  ?  ?  ?  ?  ?General bed mobility comments: totalA for attempting to come EOB but when attempting to assist with trunk elevation, patient actively resisting making it unsafe to proceed ?  ? ?Transfers ?  ?  ?  ?  ?  ?  ?   ?  ?  ?  ?  ? ?Ambulation/Gait ?  ?  ?  ?  ?  ?  ?  ?  ? ?Stairs ?  ?  ?  ?  ?  ? ?Wheelchair Mobility ?  ? ?Modified Rankin (Stroke Patients Only) ?  ? ?  ? ?Balance   ?  ?  ?  ?  ?  ?  ?  ?  ?  ?  ?  ?  ?  ?  ?  ?  ?  ?  ?   ? ? ? ?Pertinent Vitals/Pain Pain Assessment ?Pain Assessment: Faces ?Faces Pain Scale: Hurts little more ?Pain Location: generalized with movement ?Pain Descriptors / Indicators: Moaning, Grimacing ?Pain Intervention(s): Limited activity within patient's tolerance  ? ? ?Home Living Family/patient expects to be discharged to:: Skilled nursing facility ?  ?  ?  ?  ?  ?  ?  ?  ?  ?Additional Comments: LTC resident at [facility] per chart  ?  ?Prior Function Prior Level of Function : Patient poor historian/Family not available ?  ?  ?  ?  ?  ?  ?Mobility Comments: Per chart, pt is non ambulatory; suspect pt is bedbound due to R LE posturing ?ADLs Comments: Unsure of ADL assist levels; per chart, pt without issues eating/drinking (this may be in regards to aspiration rather than assist level for meals). Anticipate ADLs completed bed level ?  ? ? ?Hand Dominance  ?   ? ?  ?Extremity/Trunk Assessment  ? Upper Extremity Assessment ?Upper Extremity Assessment:  Defer to OT evaluation ?  ? ?Lower Extremity Assessment ?Lower Extremity Assessment: Difficult to assess due to impaired cognition (actively moved R LE towards L side but not on command. R LE positioned in internally rotated position with limited ROM) ?  ? ?   ?Communication  ? Communication: HOH;Expressive difficulties  ?Cognition Arousal/Alertness: Lethargic ?Behavior During Therapy: Flat affect ?Overall Cognitive Status: History of cognitive impairments - at baseline ?  ?  ?  ?  ?  ?  ?  ?  ?  ?  ?  ?  ?  ?  ?  ?  ?General Comments: hx of dementia. Not following commands but able to say her name ?  ?  ? ?  ?General Comments General comments (skin integrity, edema, etc.): Attempted to position R LE in neutral position but patient yelling  out in pain ? ?  ?Exercises    ? ?Assessment/Plan  ?  ?PT Assessment Patient does not need any further PT services  ?PT Problem List   ? ?   ?  ?PT Treatment Interventions     ? ?PT Goals (Current goals can be found in the Care Plan section)  ?Acute Rehab PT Goals ?Patient Stated Goal: unable to state ?PT Goal Formulation: All assessment and education complete, DC therapy ? ?  ?Frequency   ?  ? ? ?Co-evaluation   ?  ?  ?  ?  ? ? ?  ?AM-PAC PT "6 Clicks" Mobility  ?Outcome Measure Help needed turning from your back to your side while in a flat bed without using bedrails?: Total ?Help needed moving from lying on your back to sitting on the side of a flat bed without using bedrails?: Total ?Help needed moving to and from a bed to a chair (including a wheelchair)?: Total ?Help needed standing up from a chair using your arms (e.g., wheelchair or bedside chair)?: Total ?Help needed to walk in hospital room?: Total ?Help needed climbing 3-5 steps with a railing? : Total ?6 Click Score: 6 ? ?  ?End of Session Equipment Utilized During Treatment: Oxygen ?Activity Tolerance: Patient limited by lethargy;Patient limited by pain ?Patient left: in bed;with call bell/phone within reach;with bed alarm set ?Nurse Communication: Need for lift equipment;Mobility status ?PT Visit Diagnosis: Muscle weakness (generalized) (M62.81) ?  ? ?Time: 7680-8811 ?PT Time Calculation (min) (ACUTE ONLY): 10 min ? ? ?Charges:   PT Evaluation ?$PT Eval Moderate Complexity: 1 Mod ?  ?  ?   ? ? ?Makenzie Vittorio A. Gilford Rile, PT, DPT ?Acute Rehabilitation Services ?Pager [phone] ?Office [phone] ? ? ?Dak Szumski A Brigit Doke ?05/11/2021, 10:57 AM ? ?

## 2021-05-11 NOTE — Evaluation (Signed)
Occupational Therapy Evaluation ?Patient Details ?Name: Kristina Wilkinson ?MRN: [id number] ?DOB: [birth year] ?Today's Date: 05/11/2021 ? ? ?History of Present Illness Pt is a 88 y/o female admitted from SNF due to acute respiratory failure with hypoxia, likely due to aspiration event. PMH: DM, dementia, HTN, afib  ? ?Clinical Impression ?  ?PTA, pt from [facility] SNF. Per chart, pt is nonambulatory at baseline though unsure of ADL assist levels d/t pt poor historian. Based on R LE internal rotation position, anticipate ADLs were performed bed level at SNF. Per chart, pt typically able to communicate needs and interact in conversation. Pt presents now with inability to follow commands, moans and actively resists ROM/EOB attempts. Overall, pt requires Total A for ADLs bed level at this time and unable to progress EOB. Will follow pt on trial basis acutely for ability to self feed, communicate needs and follow basic directions. However, if no progress towards these goals, will DC at acute level. Recommend DC back to SNF.  ?   ? ?Recommendations for follow up therapy are one component of a multi-disciplinary discharge planning process, led by the attending physician.  Recommendations may be updated based on patient status, additional functional criteria and insurance authorization.  ? ?Follow Up Recommendations ? Long-term institutional care without follow-up therapy  ?  ?Assistance Recommended at Discharge Frequent or constant Supervision/Assistance  ?Patient can return home with the following Two people to help with walking and/or transfers;Two people to help with bathing/dressing/bathroom;Assistance with feeding ? ?  ?Functional Status Assessment ? Patient has had a recent decline in their functional status and demonstrates the ability to make significant improvements in function in a reasonable and predictable amount of time.  ?Equipment Recommendations ? None recommended by OT  ?  ?Recommendations for Other Services    ? ? ?  ?Precautions / Restrictions Precautions ?Precautions: Fall;Other (comment) ?Precaution Comments: hx of agitation/combative ?Restrictions ?Weight Bearing Restrictions: No  ? ?  ? ?Mobility Bed Mobility ?Overal bed mobility: Needs Assistance ?  ?  ?  ?  ?  ?  ?General bed mobility comments: Total A for attempts to come to EOB and reposition with pt actively resisting and moaning ?  ? ?Transfers ?  ?  ?  ?  ?  ?  ?  ?  ?  ?  ?  ? ?  ?Balance   ?  ?  ?  ?  ?  ?  ?  ?  ?  ?  ?  ?  ?  ?  ?  ?  ?  ?  ?   ? ?ADL either performed or assessed with clinical judgement  ? ?ADL Overall ADL's : Needs assistance/impaired ?  ?  ?  ?  ?  ?  ?  ?  ?  ?  ?  ?  ?  ?  ?  ?  ?  ?  ?  ?General ADL Comments: Overall Total A bed level for ADLs; not following commands but moans with ROM/movement attempts (hx of refusing/resisting therapy attempts per chart review). Actively resisted to bed mobility attempts  ? ? ? ?Vision Ability to See in Adequate Light:  (difficult to assess d/t eyes closed majority of session) ?Patient Visual Report: No change from baseline ?Additional Comments: difficult to assess d/t eyes closed majority of session  ?   ?Perception   ?  ?Praxis   ?  ? ?Pertinent Vitals/Pain Pain Assessment ?Pain Assessment: Faces ?Faces Pain Scale: Hurts little more ?Pain Location:  generalized with movement (> with R LE ROM) ?Pain Descriptors / Indicators: Moaning, Grimacing ?Pain Intervention(s): Limited activity within patient's tolerance, Repositioned  ? ? ? ?Hand Dominance   ?  ?Extremity/Trunk Assessment Upper Extremity Assessment ?Upper Extremity Assessment: Difficult to assess due to impaired cognition (B hand/elbow PROM [facility]) ?  ?Lower Extremity Assessment ?Lower Extremity Assessment: Defer to PT evaluation ?  ?  ?  ?Communication Communication ?Communication: HOH;Expressive difficulties ?  ?Cognition Arousal/Alertness: Lethargic ?Behavior During Therapy: Flat affect ?Overall Cognitive Status: History of cognitive  impairments - at baseline ?  ?  ?  ?  ?  ?  ?  ?  ?  ?  ?  ?  ?  ?  ?  ?  ?General Comments: hx of worsening dementia per chart; pt briefly opens eyes but does not sustain, does not follow commands; resistant to movement ?  ?  ?General Comments  R LE internally rotated; ill positioning in bed. Readjusted prevalon boots/LE positioning in bed to encourage improved joint alignment. Pt resistant to attempts to position R LE (specifically knee) in more neutral position ? ?  ?Exercises   ?  ?Shoulder Instructions    ? ? ?Home Living Family/patient expects to be discharged to:: Skilled nursing facility ?  ?  ?  ?  ?  ?  ?  ?  ?  ?  ?  ?  ?  ?  ?  ?  ?Additional Comments: Pt from [facility] and has been here for several months per chart. ?  ? ?  ?Prior Functioning/Environment Prior Level of Function : Patient poor historian/Family not available ?  ?  ?  ?  ?  ?  ?Mobility Comments: Per chart, pt is non ambulatory; suspect pt is bedbound due to R LE posturing ?ADLs Comments: Unsure of ADL assist levels; per chart, pt without issues eating/drinking (this may be in regards to aspiration rather than assist level for meals). Anticipate ADLs completed bed level ?  ? ?  ?  ?OT Problem List:   ?  ?   ?OT Treatment/Interventions:    ?  ?OT Goals(Current goals can be found in the care plan section) Acute Rehab OT Goals ?Patient Stated Goal: unable to state ?OT Goal Formulation: Patient unable to participate in goal setting ?Time For Goal Achievement: 05/25/21 ?Potential to Achieve Goals: Fair  ?OT Frequency:   ?  ? ?Co-evaluation   ?  ?  ?  ?  ? ?  ?AM-PAC OT "6 Clicks" Daily Activity     ?Outcome Measure Help from another person eating meals?: Total ?Help from another person taking care of personal grooming?: Total ?Help from another person toileting, which includes using toliet, bedpan, or urinal?: Total ?Help from another person bathing (including washing, rinsing, drying)?: Total ?Help from another person to put on and taking  off regular upper body clothing?: Total ?Help from another person to put on and taking off regular lower body clothing?: Total ?6 Click Score: 6 ?  ?End of Session Equipment Utilized During Treatment: Oxygen ?Nurse Communication: Mobility status ? ?Activity Tolerance: Patient limited by lethargy;Other (comment) (limited by cognition) ?Patient left: in bed;with call bell/phone within reach;with bed alarm set ? ?OT Visit Diagnosis: Other symptoms and signs involving cognitive function  ?              ?Time: [phone] ?OT Time Calculation (min): 13 min ?Charges:  OT General Charges ?$OT Visit: 1 Visit ?OT Evaluation ?$OT Eval Moderate Complexity: 1 Mod ? ?  Malachy Chamber, OTR/L ?Acute Rehab Services ?Office: [phone]  ? ?Layla Maw ?05/11/2021, 7:58 AM ?

## 2021-05-11 NOTE — Progress Notes (Signed)
?PROGRESS NOTE ? ?Kristina Wilkinson UVO:[id number] DOB: March 25, 1927 DOA: 05/10/2021 ?PCP: Pcp, No ? ? LOS: 1 day  ? ?Brief Narrative / Interim history: ?88 year old female with history of dementia, DM2, HTN, A-fib comes into the hospital with shortness of breath, concern for aspiration event.  She has had recent couple of admissions 1 in February this year another one in March with similar presentation, along with dehydration.  She was seen by palliative in the past as well.  Admitted discussed goals of care with the POA, and would like medical treatment, and retaining DNR ? ?Subjective / 24h Interval events: ?Confused, opens her eyes and looks at me but does not answer to my questions ? ?Assesement and Plan: ?Principal Problem: ?  Sepsis due to pneumonia [facility]) ?Active Problems: ?  Aspiration pneumonia ([place]) ?  Atrial fibrillation, chronic ([place]) ?  HTN (hypertension) ?  DM2 (diabetes mellitus, type 2) ([place]) ?  Dementia with behavioral disturbance ?  Acute metabolic encephalopathy ?  DNR (do not resuscitate) ? ? ?Assessment and Plan: ?Principal problem ?Sepsis due to pneumonia ([place]) -continue broad-spectrum antibiotics for suspected aspiration pneumonia.  Primary BC ID overnight with Staph epidermidis, thought to be a contaminant, however blood cultures this morning are growing GPC in clusters in 3 out of 4 bottles.  Continue to monitor, await speciation/sensitivities.  SLP evaluation complete, allow dysphagia 1 diet/pur?e, with thin liquids ? ?Active problems ?Atrial fibrillation, chronic ([place]) -She is usually rate controlled with Toprol XL.  Showing slow response this morning with rates in the 40s, has the appearance of [place] 1 at times.  Hold Toprol.  Has been placed on Lovenox for now for anticoagulation given concern for her swallowing/aspiration ? ?DNR (do not resuscitate) ?-Admitter discussed code status with the patient's NOK and the patient would not desire resuscitation and would prefer to die a natural death  should that situation arise. She will need a gold out of facility DNR form at the time of discharge ? ?Acute metabolic encephalopathy -due to #1, treat underlying cause ? ?Dementia with behavioral disturbance -Progressive dementia, per discussion with family ? ?DM2 (diabetes mellitus, type 2) (HCC) -Recent A1c was 6.1, indicating good control.  Monitor CBGs periodically ? ?HTN (hypertension)-Hold Toprol XL, cover with prn hydralazine IV ? ? ?Scheduled Meds: ? docusate sodium  100 mg Oral BID  ? enoxaparin (LOVENOX) injection  60 mg Subcutaneous Q12H  ? leptospermum manuka honey  1 application. Topical Daily  ? sodium chloride flush  3 mL Intravenous Q12H  ? ?Continuous Infusions: ? ceFEPime (MAXIPIME) IV 2 g (05/10/21 2237)  ? dextrose 5 % and 0.9% NaCl 75 mL/hr at 05/11/21 0251  ? metronidazole 500 mg (05/10/21 2128)  ? potassium chloride    ? vancomycin    ? ?PRN Meds:.acetaminophen **OR** acetaminophen, albuterol, bisacodyl, guaiFENesin, haloperidol lactate, hydrALAZINE, ondansetron **OR** ondansetron (ZOFRAN) IV, polyethylene glycol ? ?Diet Orders (From admission, onward)  ? ?  Start     Ordered  ? 05/11/21 0936  DIET - DYS 1 Room service appropriate? Yes; Fluid consistency: Thin  Diet effective now       ?Comments: Full assist with feeding ?Meds crushed in puree  ?Question Answer Comment  ?Room service appropriate? Yes   ?Fluid consistency: Thin   ?  ? 05/11/21 0935  ? ?  ?  ? ?  ? ? ?DVT prophylaxis:  ? ? ?Lab Results  ?Component Value Date  ? PLT 154 05/11/2021  ? ? ?  Code Status: DNR ? ?  Family Communication: no family at bedside  ? ?Status is: Inpatient ? ?Remains inpatient appropriate because: severity of illness ? ?Level of care: Telemetry Medical ? ?Consultants:  ?none ? ?Procedures:  ?None ? ? ? ?Objective: ?Vitals:  ? 05/10/21 2324 05/11/21 7741 05/11/21 2878 05/11/21 0925  ?BP: (!) 146/62 (!) 136/53 (!) 179/92 (!) 121/47  ?Pulse: 83 80 61 81  ?Resp: '18 17 20   '$ ?Temp: 98.3 ?F (36.8 ?C) 98 ?F (36.7 ?C)  98.9 ?F (37.2 ?C) 98.2 ?F (36.8 ?C)  ?TempSrc: Axillary Axillary Oral Oral  ?SpO2: 100% 98% 98% 100%  ?Weight:      ? ? ?Intake/Output Summary (Last 24 hours) at 05/11/2021 1017 ?Last data filed at 05/11/2021 0610 ?Gross per 24 hour  ?Intake 1511.75 ml  ?Output 250 ml  ?Net 1261.75 ml  ? ?Wt Readings from Last 3 Encounters:  ?05/10/21 60.7 kg  ?05/02/21 58.5 kg  ?04/01/21 65.2 kg  ? ? ?Examination: ? ?Constitutional: NAD ?Eyes: no scleral icterus ?ENMT: Mucous membranes are moist.  ?Neck: normal, supple ?Respiratory: clear to auscultation bilaterally, no wheezing, no crackles.  Shallow respirations ?Cardiovascular: Regular rate and rhythm, no murmurs / rubs / gallops.  ?Abdomen: non distended, no tenderness. Bowel sounds positive.  ?Musculoskeletal: no clubbing / cyanosis.  ?Skin: no rashes ?Neurologic: Does not follow commands consistently, no apparent focal deficits ? ?Data Reviewed: I have independently reviewed following labs and imaging studies  ? ?CBC ?Recent Labs  ?Lab 05/10/21 ?6767 05/10/21 ?2094 05/11/21 ?7096  ?WBC 11.3*  --  7.0  ?HGB 12.2 11.9* 11.0*  ?HCT 38.6 35.0* 33.9*  ?PLT 201  --  154  ?MCV 92.6  --  89.7  ?MCH 29.3  --  29.1  ?MCHC 31.6  --  32.4  ?RDW 15.0  --  15.0  ? ? ?Recent Labs  ?Lab 05/10/21 ?2836 05/10/21 ?6294 05/10/21 ?1734 05/11/21 ?7654  ?NA 143 143  --  145  ?K 4.1 4.0  --  3.1*  ?CL 112*  --   --  116*  ?CO2 20*  --   --  21*  ?GLUCOSE 152*  --   --  127*  ?BUN 27*  --   --  23  ?CREATININE 0.87  --   --  0.67  ?CALCIUM 9.7  --   --  9.1  ?AST 23  --   --   --   ?ALT 26  --   --   --   ?ALKPHOS 111  --   --   --   ?BILITOT 1.4*  --   --   --   ?ALBUMIN 2.9*  --   --   --   ?PROCALCITON  --   --  <0.10 <0.10  ?LATICACIDVEN 2.4*  --  1.3  --   ?INR 1.2  --   --   --   ? ? ?------------------------------------------------------------------------------------------------------------------ ?No results for input(s): CHOL, HDL, LDLCALC, TRIG, CHOLHDL, LDLDIRECT in the last 72 hours. ? ?Lab  Results  ?Component Value Date  ? HGBA1C 6.1 (H) 03/01/2021  ? ?------------------------------------------------------------------------------------------------------------------ ?No results for input(s): TSH, T4TOTAL, T3FREE, THYROIDAB in the last 72 hours. ? ?Invalid input(s): FREET3 ? ?Cardiac Enzymes ?No results for input(s): CKMB, TROPONINI, MYOGLOBIN in the last 168 hours. ? ?Invalid input(s): CK ?------------------------------------------------------------------------------------------------------------------ ?   ?Component Value Date/Time  ? BNP 216.5 (H) 02/28/2021 0600  ? ? ?CBG: ?Recent Labs  ?Lab 05/11/21 ?0216  ?GLUCAP 72  ? ? ?Recent Results (from the past 240  hour(s))  ?Blood culture (routine x 2)     Status: None (Preliminary result)  ? Collection Time: 05/10/21  5:20 AM  ? Specimen: BLOOD  ?Result Value Ref Range Status  ? Specimen Description BLOOD FOOT  Final  ? Special Requests   Final  ?  BOTTLES DRAWN AEROBIC AND ANAEROBIC Blood Culture adequate volume  ? Culture  Setup Time   Final  ?  GRAM POSITIVE COCCI ?AEROBIC BOTTLE ONLY ?CRITICAL VALUE NOTED.  VALUE IS CONSISTENT WITH PREVIOUSLY REPORTED AND CALLED VALUE. ?  ? Culture   Final  ?  NO GROWTH 1 DAY ?Performed at [facility], [place] [address] [place], [place] [address] ?  ? Report Status PENDING  Incomplete  ?Blood culture (routine x 2)     Status: None (Preliminary result)  ? Collection Time: 05/10/21  5:25 AM  ? Specimen: BLOOD LEFT FOREARM  ?Result Value Ref Range Status  ? Specimen Description BLOOD LEFT FOREARM  Final  ? Special Requests   Final  ?  BOTTLES DRAWN AEROBIC AND ANAEROBIC Blood Culture adequate volume  ? Culture  Setup Time   Final  ?  GRAM POSITIVE COCCI IN CLUSTERS ?IN BOTH AEROBIC AND ANAEROBIC BOTTLES ?CRITICAL RESULT CALLED TO, READ BACK BY AND VERIFIED WITH: T RUDISILL,PHARMD'@0256'$  05/11/21 [place] ?  ? Culture   Final  ?  GRAM POSITIVE COCCI ?CULTURE REINCUBATED FOR BETTER GROWTH ?Performed at [place], [place] [address] [place], [place] [address] ?  ? Report Status PENDING  Incomplete  ?Blood Culture ID Panel (Reflexed)     Status: Abnormal  ? Collection Time: 05/10/21  5:25 AM  ?Result Value Ref Range Stat

## 2021-05-11 NOTE — Progress Notes (Signed)
PHARMACY - PHYSICIAN COMMUNICATION ?CRITICAL VALUE ALERT - BLOOD CULTURE IDENTIFICATION (BCID) ? ?Kristina Wilkinson is an 88 y.o. female who presented to [facility] on 05/10/2021 with a chief complaint of Acute respiratory failure with hypoxia ? ?Assessment:  1 of 2 sets positive, likely contaminant  (include suspected source if known) ? ?Name of physician (or Provider) ContactedHal Hope ? ?Current antibiotics: cefepime and Vancomycin ? ?Changes to prescribed antibiotics recommended:  ?Patient is on recommended antibiotics - No changes needed ? ?Results for orders placed or performed during the hospital encounter of 05/10/21  ?Blood Culture ID Panel (Reflexed) (Collected: 05/10/2021  5:25 AM)  ?Result Value Ref Range  ? Enterococcus faecalis NOT DETECTED NOT DETECTED  ? Enterococcus Faecium NOT DETECTED NOT DETECTED  ? Listeria monocytogenes NOT DETECTED NOT DETECTED  ? Staphylococcus species DETECTED (A) NOT DETECTED  ? Staphylococcus aureus (BCID) NOT DETECTED NOT DETECTED  ? Staphylococcus epidermidis DETECTED (A) NOT DETECTED  ? Staphylococcus lugdunensis NOT DETECTED NOT DETECTED  ? Streptococcus species NOT DETECTED NOT DETECTED  ? Streptococcus agalactiae NOT DETECTED NOT DETECTED  ? Streptococcus pneumoniae NOT DETECTED NOT DETECTED  ? Streptococcus pyogenes NOT DETECTED NOT DETECTED  ? A.calcoaceticus-baumannii NOT DETECTED NOT DETECTED  ? Bacteroides fragilis NOT DETECTED NOT DETECTED  ? Enterobacterales NOT DETECTED NOT DETECTED  ? Enterobacter cloacae complex NOT DETECTED NOT DETECTED  ? Escherichia coli NOT DETECTED NOT DETECTED  ? Klebsiella aerogenes NOT DETECTED NOT DETECTED  ? Klebsiella oxytoca NOT DETECTED NOT DETECTED  ? Klebsiella pneumoniae NOT DETECTED NOT DETECTED  ? Proteus species NOT DETECTED NOT DETECTED  ? Salmonella species NOT DETECTED NOT DETECTED  ? Serratia marcescens NOT DETECTED NOT DETECTED  ? Haemophilus influenzae NOT DETECTED NOT DETECTED  ? Neisseria meningitidis NOT DETECTED  NOT DETECTED  ? Pseudomonas aeruginosa NOT DETECTED NOT DETECTED  ? Stenotrophomonas maltophilia NOT DETECTED NOT DETECTED  ? Candida albicans NOT DETECTED NOT DETECTED  ? Candida auris NOT DETECTED NOT DETECTED  ? Candida glabrata NOT DETECTED NOT DETECTED  ? Candida krusei NOT DETECTED NOT DETECTED  ? Candida parapsilosis NOT DETECTED NOT DETECTED  ? Candida tropicalis NOT DETECTED NOT DETECTED  ? Cryptococcus neoformans/gattii NOT DETECTED NOT DETECTED  ? Methicillin resistance mecA/C DETECTED (A) NOT DETECTED  ? ? ?[name] [name] ?05/11/2021  3:10 AM ? ?

## 2021-05-12 DIAGNOSIS — J189 Pneumonia, unspecified organism: Secondary | ICD-10-CM | POA: Diagnosis not present

## 2021-05-12 DIAGNOSIS — A419 Sepsis, unspecified organism: Secondary | ICD-10-CM | POA: Diagnosis not present

## 2021-05-12 LAB — BASIC METABOLIC PANEL
Anion gap: 5 (ref 5–15)
BUN: 19 mg/dL (ref 8–23)
CO2: 20 mmol/L — ABNORMAL LOW (ref 22–32)
Calcium: 8.8 mg/dL — ABNORMAL LOW (ref 8.9–10.3)
Chloride: 120 mmol/L — ABNORMAL HIGH (ref 98–111)
Creatinine, Ser: 0.57 mg/dL (ref 0.44–1.00)
GFR, Estimated: >60 mL/min (ref >60)
Glucose, Bld: 157 mg/dL — ABNORMAL HIGH (ref 70–99)
Potassium: 3.2 mmol/L — ABNORMAL LOW (ref 3.5–5.1)
Sodium: 145 mmol/L (ref 135–145)

## 2021-05-12 LAB — CBC
HCT: 33.2 % — ABNORMAL LOW (ref 36.0–46.0)
Hemoglobin: 10.7 g/dL — ABNORMAL LOW (ref 12.0–15.0)
MCH: 29.1 pg (ref 26.0–34.0)
MCHC: 32.2 g/dL (ref 30.0–36.0)
MCV: 90.2 fL (ref 80.0–100.0)
Platelets: 156 10*3/uL (ref 150–400)
RBC: 3.68 MIL/uL — ABNORMAL LOW (ref 3.87–5.11)
RDW: 14.9 % (ref 11.5–15.5)
WBC: 7.4 10*3/uL (ref 4.0–10.5)
nRBC: 0 % (ref 0.0–0.2)

## 2021-05-12 LAB — LEGIONELLA PNEUMOPHILA SEROGP 1 UR AG: L. pneumophila Serogp 1 Ur Ag: NEGATIVE

## 2021-05-12 LAB — PROCALCITONIN: Procalcitonin: <0.1 ng/mL

## 2021-05-12 MED ORDER — APIXABAN 2.5 MG PO TABS
2.5000 mg | ORAL_TABLET | Freq: Two times a day (BID) | ORAL | Status: DC
  Administered 2021-05-12: 2.5 mg via ORAL
  Filled 2021-05-12 (×2): qty 1

## 2021-05-12 MED ORDER — POTASSIUM CHLORIDE 20 MEQ PO PACK: 40.0000 meq | PACK | Freq: Once | ORAL | Status: DC

## 2021-05-12 MED ORDER — POTASSIUM CHLORIDE 10 MEQ/100ML IV SOLN
10.0000 meq | INTRAVENOUS | Status: AC
  Administered 2021-05-12 (×4): 10 meq via INTRAVENOUS
  Filled 2021-05-12 (×4): qty 100

## 2021-05-12 NOTE — Progress Notes (Signed)
SLP Cancellation Note ? ?Patient Details ?Name: Kristina Wilkinson ?MRN: [id number] ?DOB: [birth year] ? ? ?Cancelled treatment:       Reason Eval/Treat Not Completed: Other (comment);Patient declined, no reason specified (pt refused all po trials despite being informed of reasoning for session, to assure tolerance and determine readiness for advancement in po) ?[name], MS CCC SLP ?Acute Rehab Services ?Office [phone] ?Pager [phone] ? ? ?[name] ?05/12/2021, 11:15 AM ?

## 2021-05-12 NOTE — Progress Notes (Signed)
?PROGRESS NOTE ? ?Kristina Wilkinson FHQ:[id number] DOB: [birth year] DOA: 05/10/2021 ?PCP: Pcp, No ? ? LOS: 2 days  ? ?Brief Narrative / Interim history: ?88 year old female with history of dementia, DM2, HTN, A-fib comes into the hospital with shortness of breath, concern for aspiration event.  She has had recent couple of admissions 1 in February this year another one in March with similar presentation, along with dehydration.  She was seen by palliative in the past as well.  Admitted discussed goals of care with the POA, and would like medical treatment, and retaining DNR ? ?Subjective / 24h Interval events: ?Confused, she is opening her eyes, looks at me but does not answer questions ? ?Assesement and Plan: ?Principal Problem: ?  Sepsis due to pneumonia [facility]) ?Active Problems: ?  Aspiration pneumonia ([place]) ?  Atrial fibrillation, chronic ([place]) ?  HTN (hypertension) ?  DM2 (diabetes mellitus, type 2) ([place]) ?  Dementia with behavioral disturbance ?  Acute metabolic encephalopathy ?  DNR (do not resuscitate) ? ? ?Assessment and Plan: ?Principal problem ?Sepsis due to pneumonia [facility]) -continue antibiotics, 3/4 bottles showed staph epi, likely contaminant.  Procalcitonin is negative, white count is normal and she is afebrile.  Awaiting urine cultures once that is resulted could potentially cover for that and pneumonia ? ?Active problems ?Atrial fibrillation, chronic ([place]) -She is usually rate controlled with Toprol XL.  Seems to be in the 60s without Toprol, continue to hold. Has been placed on Lovenox initially for anticoagulation given concern for her swallowing/aspiration, but now she is on dysphagia diet, transition to Eliquis ? ?Possible UTI-cannot assess for symptoms.  Urinary culture shows GNR's, pending ? ?DNR (do not resuscitate) -Admitter discussed code status with the patient's NOK and the patient would not desire resuscitation and would prefer to die a natural death should that situation arise. She will need a  gold out of facility DNR form at the time of discharge ? ?Acute metabolic encephalopathy -due to #1, treat underlying cause, seems to be improving ? ?Dementia with behavioral disturbance -Progressive dementia, per discussion with family ? ?DM2 (diabetes mellitus, type 2) (HCC) -Recent A1c was 6.1, indicating good control.  Monitor CBGs periodically ? ?HTN (hypertension)-Hold Toprol XL, cover with prn hydralazine IV ? ? ?Scheduled Meds: ? docusate sodium  100 mg Oral BID  ? enoxaparin (LOVENOX) injection  60 mg Subcutaneous Q12H  ? leptospermum manuka honey  1 application. Topical Daily  ? potassium chloride  40 mEq Oral Once  ? sodium chloride flush  3 mL Intravenous Q12H  ? ?Continuous Infusions: ? ceFEPime (MAXIPIME) IV 2 g (05/11/21 2225)  ? dextrose 5 % and 0.9% NaCl 75 mL/hr at 05/11/21 2213  ? metronidazole 500 mg (05/12/21 1027)  ? vancomycin 750 mg (05/11/21 1236)  ? ?PRN Meds:.acetaminophen **OR** acetaminophen, albuterol, bisacodyl, guaiFENesin, haloperidol lactate, hydrALAZINE, ondansetron **OR** ondansetron (ZOFRAN) IV, polyethylene glycol ? ?Diet Orders (From admission, onward)  ? ?  Start     Ordered  ? 05/11/21 0936  DIET - DYS 1 Room service appropriate? Yes; Fluid consistency: Thin  Diet effective now       ?Comments: Full assist with feeding ?Meds crushed in puree  ?Question Answer Comment  ?Room service appropriate? Yes   ?Fluid consistency: Thin   ?  ? 05/11/21 0935  ? ?  ?  ? ?  ? ? ?DVT prophylaxis:  ? ? ?Lab Results  ?Component Value Date  ? PLT 156 05/12/2021  ? ? ?  Code Status: DNR ? ?  Family Communication: no family at bedside  ? ?Status is: Inpatient ? ?Remains inpatient appropriate because: severity of illness ? ?Level of care: Telemetry Medical ? ?Consultants:  ?none ? ?Procedures:  ?None ? ? ? ?Objective: ?Vitals:  ? 05/11/21 0925 05/11/21 1650 05/12/21 0442 05/12/21 0908  ?BP: (!) 121/47 (!) 147/46 128/82 122/65  ?Pulse: 81 (!) 52 60 69  ?Resp:  '16 18 18  '$ ?Temp: 98.2 ?F (36.8 ?C) 97.8  ?F (36.6 ?C)  98.1 ?F (36.7 ?C)  ?TempSrc: Oral Axillary  Axillary  ?SpO2: 100% 100% 100% 97%  ?Weight:      ? ? ?Intake/Output Summary (Last 24 hours) at 05/12/2021 1118 ?Last data filed at 05/12/2021 0600 ?Gross per 24 hour  ?Intake 2459.87 ml  ?Output 1000 ml  ?Net 1459.87 ml  ? ? ?Wt Readings from Last 3 Encounters:  ?05/10/21 60.7 kg  ?05/02/21 58.5 kg  ?04/01/21 65.2 kg  ? ? ?Examination: ? ?Constitutional: NAD ?Eyes: lids and conjunctivae normal, no scleral icterus ?ENMT: mmm ?Neck: normal, supple ?Respiratory: clear to auscultation bilaterally, no wheezing, no crackles. Normal respiratory effort.  ?Cardiovascular: Regular rate and rhythm, no murmurs / rubs / gallops. No LE edema. ?Abdomen: soft, no distention, no tenderness. Bowel sounds positive.  ?Skin: no rashes ?Neurologic: Does not follow commands consistently but appears nonfocal ? ?Data Reviewed: I have independently reviewed following labs and imaging studies  ? ?CBC ?Recent Labs  ?Lab 05/10/21 ?0093 05/10/21 ?8182 05/11/21 ?9937 05/12/21 ?0117  ?WBC 11.3*  --  7.0 7.4  ?HGB 12.2 11.9* 11.0* 10.7*  ?HCT 38.6 35.0* 33.9* 33.2*  ?PLT 201  --  154 156  ?MCV 92.6  --  89.7 90.2  ?MCH 29.3  --  29.1 29.1  ?MCHC 31.6  --  32.4 32.2  ?RDW 15.0  --  15.0 14.9  ? ? ? ?Recent Labs  ?Lab 05/10/21 ?1696 05/10/21 ?7893 05/10/21 ?1734 05/11/21 ?8101 05/12/21 ?0117  ?NA 143 143  --  145 145  ?K 4.1 4.0  --  3.1* 3.2*  ?CL 112*  --   --  116* 120*  ?CO2 20*  --   --  21* 20*  ?GLUCOSE 152*  --   --  127* 157*  ?BUN 27*  --   --  23 19  ?CREATININE 0.87  --   --  0.67 0.57  ?CALCIUM 9.7  --   --  9.1 8.8*  ?AST 23  --   --   --   --   ?ALT 26  --   --   --   --   ?ALKPHOS 111  --   --   --   --   ?BILITOT 1.4*  --   --   --   --   ?ALBUMIN 2.9*  --   --   --   --   ?PROCALCITON  --   --  <0.10 <0.10 <0.10  ?LATICACIDVEN 2.4*  --  1.3  --   --   ?INR 1.2  --   --   --   --    ? ? ? ?------------------------------------------------------------------------------------------------------------------ ?No results for input(s): CHOL, HDL, LDLCALC, TRIG, CHOLHDL, LDLDIRECT in the last 72 hours. ? ?Lab Results  ?Component Value Date  ? HGBA1C 6.1 (H) 03/01/2021  ? ?------------------------------------------------------------------------------------------------------------------ ?No results for input(s): TSH, T4TOTAL, T3FREE, THYROIDAB in the last 72 hours. ? ?Invalid input(s): FREET3 ? ?Cardiac Enzymes ?No results for input(s): CKMB, TROPONINI, MYOGLOBIN in the last 168 hours. ? ?Invalid  input(s): CK ?------------------------------------------------------------------------------------------------------------------ ?   ?Component Value Date/Time  ? BNP 216.5 (H) 02/28/2021 0600  ? ? ?CBG: ?Recent Labs  ?Lab 05/11/21 ?0216  ?GLUCAP 72  ? ? ? ?Recent Results (from the past 240 hour(s))  ?Blood culture (routine x 2)     Status: Abnormal (Preliminary result)  ? Collection Time: 05/10/21  5:20 AM  ? Specimen: BLOOD  ?Result Value Ref Range Status  ? Specimen Description BLOOD FOOT  Final  ? Special Requests   Final  ?  BOTTLES DRAWN AEROBIC AND ANAEROBIC Blood Culture adequate volume  ? Culture  Setup Time   Final  ?  GRAM POSITIVE COCCI ?AEROBIC BOTTLE ONLY ?CRITICAL VALUE NOTED.  VALUE IS CONSISTENT WITH PREVIOUSLY REPORTED AND CALLED VALUE. ?  ? Culture (A)  Final  ?  STAPHYLOCOCCUS EPIDERMIDIS ?SUSCEPTIBILITIES TO FOLLOW ?Performed at [facility], [place] [address] [place], [place] [address] ?  ? Report Status PENDING  Incomplete  ?Blood culture (routine x 2)     Status: Abnormal (Preliminary result)  ? Collection Time: 05/10/21  5:25 AM  ? Specimen: BLOOD LEFT FOREARM  ?Result Value Ref Range Status  ? Specimen Description BLOOD LEFT FOREARM  Final  ? Special Requests   Final  ?  BOTTLES DRAWN AEROBIC AND ANAEROBIC Blood Culture adequate volume  ? Culture  Setup Time   Final  ?  GRAM  POSITIVE COCCI IN CLUSTERS ?IN BOTH AEROBIC AND ANAEROBIC BOTTLES ?CRITICAL RESULT CALLED TO, READ BACK BY AND VERIFIED WITH: T RUDISILL,PHARMD'@0256'$  05/11/21 [place] ?Performed at [facility], [place] [address] [place], [place] [address] ?  ? Culture STAPHYLOCOCCUS EPIDERMIDIS (A)  Final  ? Report Status

## 2021-05-12 NOTE — Care Management Important Message (Signed)
Important Message ? ?Patient Details  ?Name: Kristina Wilkinson ?MRN: [id number] ?Date of Birth: [birth year] ? ? ?Medicare Important Message Given:  Yes ? ? ? ? ?Tnya Ades ?05/12/2021, 12:17 PM ?

## 2021-05-12 NOTE — Progress Notes (Signed)
ANTICOAGULATION CONSULT NOTE - Initial Consult ? ?Pharmacy Consult for apixaban ?Indication: atrial fibrillation ? ?Allergies  ?Allergen Reactions  ? Gabapentin Itching and Other (See Comments)  ?  Made the legs "go numb" and "allergic," per [facility]  ? Other Other (See Comments)  ?  "MD mixed a blood pressure medication with the Clinoril I was taking and I couldn't walk" "made me achy and sick"  ? Penicillins Other (See Comments) and Itching  ?  Has patient had a PCN reaction causing immediate rash, facial/tongue/throat swelling, SOB or lightheadedness with hypotension: Unk- exact reaction not recalled, but is allergic ?Has patient had a PCN reaction causing severe rash involving mucus membranes or skin necrosis: Unk ?Has patient had a PCN reaction that required hospitalization: Unk ?Has patient had a PCN reaction occurring within the last 10 years: Yes ?If all of the above answers are "NO", then may proceed with Cephalosporin use. ? ?Itching and rash  ? Pregabalin Other (See Comments)  ?  "Made me pass out" and "made the limbs go numb"- "allergic," per [facility]  ? Vit D-Vit E-Safflower Oil Other (See Comments)  ?  "Made me achy and sick"  ? ? ?Patient Measurements: ?Weight: 60.7 kg (133 lb 13.1 oz) ? ? ?Vital Signs: ?Temp: 98.1 ?F (36.7 ?C) (03/31 3007) ?Temp Source: Axillary (03/31 0908) ?BP: 122/65 (03/31 0908) ?Pulse Rate: 69 (03/31 0908) ? ?Labs: ?Recent Labs  ?  05/10/21 ?6226 05/10/21 ?3335 05/10/21 ?4562 05/11/21 ?5638 05/12/21 ?0117  ?HGB 12.2 11.9*  --  11.0* 10.7*  ?HCT 38.6 35.0*  --  33.9* 33.2*  ?PLT 201  --   --  154 156  ?LABPROT 15.1  --   --   --   --   ?INR 1.2  --   --   --   --   ?CREATININE 0.87  --   --  0.67 0.57  ?TROPONINIHS 21*  --  19*  --   --   ? ? ?Estimated Creatinine Clearance: 42 mL/min (by C-G formula based on SCr of 0.57 mg/dL). ? ? ?Medical History: ?Past Medical History:  ?Diagnosis Date  ? Arthritis   ? Cellulitis 11/30/2018  ? CNS aneurysm 1983  ? s/p clipping  ? Diabetes mellitus type  II   ? diet controlled   ? DVT (deep venous thrombosis) ([place]) 10/15  ? Elevated glucose   ? Hypertension   ? Insomnia   ? Knee pain   ? Left Dr. Meyer Cory  ? LBP (low back pain)   ? PN (peripheral neuropathy)   ? unknown etiol  ? Scalp laceration   ? Sepsis due to gram-negative UTI ([place]) 03/06/2019  ? Sepsis due to urinary tract infection ([place]) 03/05/2019  ? Skull fracture ([place]) 07/13/2016  ? Traumatic rhabdomyolysis ([place]) 11/30/2018  ? ? ?Medications:  ?Medications Prior to Admission  ?Medication Sig Dispense Refill Last Dose  ? acetaminophen (TYLENOL) 325 MG tablet Take 2 tablets (650 mg total) by mouth every 6 (six) hours as needed for mild pain (or Fever >/= 101). 20 tablet  Past Week  ? apixaban (ELIQUIS) 2.5 MG TABS tablet Take 1 tablet (2.5 mg total) by mouth 2 (two) times daily. 60 tablet  05/09/2021 at 2100  ? busPIRone (BUSPAR) 5 MG tablet Take 1 tablet (5 mg total) by mouth 2 (two) times daily.   05/09/2021 at 2000  ? leptospermum manuka honey (MEDIHONEY) PSTE paste Apply 1 application. topically daily.   05/09/2021  ? metoprolol succinate (TOPROL-XL) 25 MG  24 hr tablet Take 0.5 tablets (12.5 mg total) by mouth daily.   05/09/2021 at 0800  ? Multiple Vitamins-Minerals (DECUBI-VITE) CAPS Take 1 capsule by mouth daily.   05/09/2021  ? polyethylene glycol (MIRALAX / GLYCOLAX) 17 g packet Take 17 g by mouth 2 (two) times daily. 14 each 0 05/09/2021 at 2000  ? senna (SENOKOT) 8.6 MG TABS tablet Take 8.6 mg by mouth in the morning and at bedtime.   05/09/2021 at 2000  ? sodium phosphate (FLEET) 7-19 GM/118ML ENEM Place 133 mLs (1 enema total) rectally once as needed for severe constipation.  0 unk  ? UNABLE TO FIND Take 90 mLs by mouth 2 (two) times daily. Med Name: Medpass 2.0 , Nutritional supplement   05/09/2021  ? vitamin C (ASCORBIC ACID) 500 MG tablet Take 500 mg by mouth 2 (two) times daily.   05/09/2021  ? ? ?Assessment: ?88 yo female with h/o afib on apixaban PTA. Patient transitioned to enoxaparin while having  problems with swallowing and aspiration, but now on dysphagia diet, therefore patient being transitioned back to PO apixaban. PTA dose 2.'5mg'$  PO BID which is appropriate given age >40 and weight ~60kg or lower.  ? ?Goal of Therapy:  ?Prevention of stroke ?Monitor platelets by anticoagulation protocol: Yes ?  ?Plan:  ?Resume home apixaban 2.'5mg'$  PO BID ?D/c enoxaparin ?Monitor for bleeding ? ?Haziel Molner A. Levada Dy, PharmD, BCPS, FNKF ?Clinical Pharmacist ?[place] ?Please utilize Amion for appropriate phone number to reach the unit pharmacist ([place]) ? ?05/12/2021,11:40 AM ? ? ?

## 2021-05-12 NOTE — TOC Progression Note (Signed)
Transition of Care (TOC) - Initial/Assessment Note  ? ? ?Patient Details  ?Name: Kristina Wilkinson ?MRN: [id number] ?Date of Birth: 04/14/1927 ? ?Transition of Care (TOC) CM/SW Contact:    ?Paulene Floor Baine Decesare, LCSWA ?Phone Number: ?05/12/2021, 1:02 PM ? ?Clinical Narrative:                 ?CSW contacted Janie with admissions at [facility] the patient can d/c over the weekend if the facility has staff.  When patient is medically ready, the facility will need to be contacted to confirm coverage.  MD notified.  ?  ? ? ?Patient Goals and CMS Choice ?  ?  ?  ? ?Expected Discharge Plan and Services ?  ?  ?  ?  ?  ?                ?  ?  ?  ?  ?  ?  ?  ?  ?  ?  ? ?Prior Living Arrangements/Services ?  ?  ?  ?       ?  ?  ?  ?  ? ?Activities of Daily Living ?  ?  ? ?Permission Sought/Granted ?  ?  ?   ?   ?   ?   ? ?Emotional Assessment ?  ?  ?  ?  ?  ?  ? ?Admission diagnosis:  Lactic acidosis [E87.20] ?Acute respiratory failure with hypoxia ([place]) [J96.01] ?Altered mental status, unspecified altered mental status type [R41.82] ?Sepsis due to pneumonia ([place]) [J18.9, A41.9] ?Aspiration pneumonia of right lower lobe, unspecified aspiration pneumonia type ([place]) [J69.0] ?Patient Active Problem List  ? Diagnosis Date Noted  ? Sepsis due to pneumonia ([place]) 05/10/2021  ? Aspiration pneumonia ([place]) 05/10/2021  ? Acute metabolic encephalopathy 10/62/6948  ? DNR (do not resuscitate) 05/10/2021  ? Dementia with behavioral disturbance 05/08/2021  ? Palliative care encounter 05/08/2021  ? Moderate protein-calorie malnutrition ([place]) 05/08/2021  ? Constipation 04/30/2021  ? Hypothermia 04/29/2021  ? Acute encephalopathy 03/29/2021  ? Pressure injury of skin 03/01/2021  ? Sepsis secondary to UTI ([place]) 02/28/2021  ? Dementia without behavioral disturbance ([place]) 02/28/2021  ? Normocytic anemia 02/28/2021  ? Atrial fibrillation, chronic ([place]) 09/25/2020  ? Restless leg syndrome 03/14/2019  ? Falls 03/05/2019  ? Open toe wound 12/01/2018  ?  Transaminitis 11/30/2018  ? Abnormal urinalysis 11/30/2018  ? Leukocytosis 11/30/2018  ? Hypokalemia 11/30/2018  ? Chronic left shoulder pain 04/03/2016  ? DM2 (diabetes mellitus, type 2) ([place]) 06/27/2015  ? Hearing loss 06/21/2015  ? Advance care planning 12/17/2014  ? Palpitations 07/06/2014  ? Pain in joint, ankle and foot 04/20/2014  ? DVT, lower extremity ([place]) 11/19/2013  ? Edema 10/20/2013  ? Headache(784.0) 10/20/2013  ? Left leg swelling 10/20/2013  ? Aneurysm of basilar artery (HCC) 10/20/2013  ? Bradycardia 10/20/2013  ? Vitamin B12 deficiency 07/16/2013  ? Paresthesia of both feet 05/17/2013  ? TMJ arthritis 09/11/2012  ? Ear pain 09/11/2012  ? History of transfusion of packed red blood cells 09/11/2012  ? Spinal stenosis 04/13/2011  ? Well adult exam 01/23/2011  ? Cholecystitis with cholelithiasis, s/p lap chole 11/10/2010 12/01/2010  ? Shoulder pain, right 07/25/2010  ? TOBACCO USE, QUIT 03/09/2009  ? KNEE PAIN 05/18/2008  ? PN (peripheral neuropathy) 08/19/2007  ? LEG PAIN 08/19/2007  ? Vitamin D deficiency 02/17/2007  ? INSOMNIA, PERSISTENT 02/17/2007  ? HTN (hypertension) 02/17/2007  ? GERD 02/17/2007  ? Osteoarthritis 02/17/2007  ? LOW BACK PAIN  02/17/2007  ? Hyperglycemia 01/28/2007  ? UNSPECIFIED OSTEOPOROSIS 01/28/2007  ? PRECIPITOUS DROP IN HEMATOCRIT 01/28/2007  ? ?PCP:  Pcp, No ?Pharmacy:   ?[facility] ?[address] [address] ?Phone: [phone] Fax: [phone] ? ? ? ? ?Social Determinants of Health (SDOH) Interventions ?  ? ?Readmission Risk Interventions ? ?  03/30/2021  ?  2:14 PM 03/01/2021  ?  2:52 PM  ?Readmission Risk Prevention Plan  ?Transportation Screening Complete Complete  ?PCP or Specialist Appt within 3-5 Days  Complete  ?[place] or Home Care Consult  Complete  ?Social Work Consult for [place] Planning/Counseling  Complete  ?Palliative Care Screening  Not Applicable  ?Medication Review Human resources officer) Complete Complete  ?PCP or Specialist appointment within 3-5 days of discharge Complete   ?[place] or Home Care Consult Complete   ?SW Recovery Care/Counseling Consult Complete   ?Palliative Care Screening Not Applicable   ?Skilled Nursing Facility Complete   ? ? ? ?

## 2021-05-13 DIAGNOSIS — A419 Sepsis, unspecified organism: Secondary | ICD-10-CM | POA: Diagnosis not present

## 2021-05-13 DIAGNOSIS — J189 Pneumonia, unspecified organism: Secondary | ICD-10-CM | POA: Diagnosis not present

## 2021-05-13 LAB — CBC
HCT: 32 % — ABNORMAL LOW (ref 36.0–46.0)
Hemoglobin: 10.4 g/dL — ABNORMAL LOW (ref 12.0–15.0)
MCH: 29 pg (ref 26.0–34.0)
MCHC: 32.5 g/dL (ref 30.0–36.0)
MCV: 89.1 fL (ref 80.0–100.0)
Platelets: 183 10*3/uL (ref 150–400)
RBC: 3.59 MIL/uL — ABNORMAL LOW (ref 3.87–5.11)
RDW: 15.2 % (ref 11.5–15.5)
WBC: 9.7 10*3/uL (ref 4.0–10.5)
nRBC: 0 % (ref 0.0–0.2)

## 2021-05-13 LAB — URINE CULTURE: Culture: >=100000 — AB

## 2021-05-13 LAB — BASIC METABOLIC PANEL
Anion gap: 7 (ref 5–15)
BUN: 16 mg/dL (ref 8–23)
CO2: 16 mmol/L — ABNORMAL LOW (ref 22–32)
Calcium: 9 mg/dL (ref 8.9–10.3)
Chloride: 121 mmol/L — ABNORMAL HIGH (ref 98–111)
Creatinine, Ser: 0.5 mg/dL (ref 0.44–1.00)
GFR, Estimated: >60 mL/min (ref >60)
Glucose, Bld: 159 mg/dL — ABNORMAL HIGH (ref 70–99)
Potassium: 4 mmol/L (ref 3.5–5.1)
Sodium: 144 mmol/L (ref 135–145)

## 2021-05-13 LAB — CULTURE, BLOOD (ROUTINE X 2): Special Requests: ADEQUATE

## 2021-05-13 LAB — GLUCOSE, CAPILLARY: Glucose-Capillary: 114 mg/dL — ABNORMAL HIGH (ref 70–99)

## 2021-05-13 MED ORDER — SODIUM CHLORIDE 0.9 % IV SOLN
2.0000 g | INTRAVENOUS | Status: DC
  Administered 2021-05-13: 2 g via INTRAVENOUS
  Filled 2021-05-13: qty 20

## 2021-05-13 MED ORDER — METOPROLOL TARTRATE 5 MG/5ML IV SOLN
2.5000 mg | Freq: Three times a day (TID) | INTRAVENOUS | Status: DC
  Administered 2021-05-13 – 2021-05-14 (×4): 2.5 mg via INTRAVENOUS
  Filled 2021-05-13 (×4): qty 5

## 2021-05-13 MED ORDER — ENOXAPARIN SODIUM 100 MG/ML IJ SOSY
90.0000 mg | PREFILLED_SYRINGE | INTRAMUSCULAR | Status: DC
  Administered 2021-05-13 – 2021-05-14 (×2): 90 mg via SUBCUTANEOUS
  Filled 2021-05-13 (×2): qty 1

## 2021-05-13 NOTE — Progress Notes (Signed)
?PROGRESS NOTE ? ?Kristina Wilkinson CLE:[id number] DOB: 1927-08-19 DOA: 05/10/2021 ?PCP: Pcp, No ? ? LOS: 3 days  ? ?Brief Narrative / Interim history: ?88 year old female with history of dementia, DM2, HTN, A-fib comes into the hospital with shortness of breath, concern for aspiration event.  She has had recent couple of admissions 1 in February this year another one in March with similar presentation, along with dehydration.  She was seen by palliative in the past as well.  Admitted discussed goals of care with the POA, and would like medical treatment, and retaining DNR ? ?Subjective / 24h Interval events: ?Remains confused.  [place] on. Intermittently refusing medications ? ?Assesement and Plan: ?Principal Problem: ?  Sepsis due to pneumonia [facility]) ?Active Problems: ?  Aspiration pneumonia ([place]) ?  Atrial fibrillation, chronic ([place]) ?  HTN (hypertension) ?  DM2 (diabetes mellitus, type 2) ([place]) ?  Dementia with behavioral disturbance ([place]) ?  Acute metabolic encephalopathy ?  DNR (do not resuscitate) ? ? ?Assessment and Plan: ?Principal problem ?Sepsis due to pneumonia [facility]), possible UTI, possible bacteremia-continue antibiotics, 3/4 bottles showed staph epi, possibly contaminant but not sure.  Repeat blood cultures.  Will discuss with ID MD.  Procalcitonin is negative, white count is normal and she is afebrile currently.  Urine cultures with 2 species, procidentia and Enterococcus.  Switch to Vanco and ceftriaxone today to cover culture data ? ?Active problems ?Atrial fibrillation, chronic ([place]) -She is usually rate controlled with Toprol XL.  This was held on admission due to bradycardia with rates into the 60s as well as concern for sepsis.  Heart rate this morning 100-110, since she is refusing p.o. medications intermittently will place on low-dose IV metoprolol.  Continue Lovenox instead of Eliquis, will switch to pills when she will take p.o. more consistently ? ?Possible UTI-cannot assess for symptoms, but  given nausea and vomiting prior to admission would favor to treat.  Continue antibiotics as above ? ?Acute metabolic encephalopathy -due to #1, treat underlying cause, seems to be improving but unclear baseline ? ?Dementia with behavioral disturbance -Progressive dementia, per discussion with family ? ?DM2 (diabetes mellitus, type 2) (HCC) -Recent A1c was 6.1, indicating good control.  Monitor CBGs periodically ? ?HTN (hypertension)-on metoprolol IV ? ? ?Scheduled Meds: ? docusate sodium  100 mg Oral BID  ? enoxaparin (LOVENOX) injection  90 mg Subcutaneous Q24H  ? leptospermum manuka honey  1 application. Topical Daily  ? metoprolol tartrate  2.5 mg Intravenous Q8H  ? sodium chloride flush  3 mL Intravenous Q12H  ? ?Continuous Infusions: ? cefTRIAXone (ROCEPHIN)  IV    ? dextrose 5 % and 0.9% NaCl 75 mL/hr at 05/13/21 0404  ? vancomycin 750 mg (05/12/21 1247)  ? ?PRN Meds:.acetaminophen **OR** acetaminophen, albuterol, bisacodyl, guaiFENesin, haloperidol lactate, hydrALAZINE, ondansetron **OR** ondansetron (ZOFRAN) IV, polyethylene glycol ? ?Diet Orders (From admission, onward)  ? ?  Start     Ordered  ? 05/11/21 0936  DIET - DYS 1 Room service appropriate? Yes; Fluid consistency: Thin  Diet effective now       ?Comments: Full assist with feeding ?Meds crushed in puree  ?Question Answer Comment  ?Room service appropriate? Yes   ?Fluid consistency: Thin   ?  ? 05/11/21 0935  ? ?  ?  ? ?  ? ? ?DVT prophylaxis: apixaban (ELIQUIS) tablet 2.5 mg Start: 05/12/21 2200 ?apixaban (ELIQUIS) tablet 2.5 mg  ? ?Lab Results  ?Component Value Date  ? PLT 183 05/13/2021  ? ? ?  Code Status: DNR ? ?Family Communication: no family at bedside, attempted several times to call the POA in the past 2 days without success.  We will attempt again today ? ?Status is: Inpatient ? ?Remains inpatient appropriate because: severity of illness ? ?Level of care: Telemetry Medical ? ?Consultants:  ?none ? ?Procedures:   ?None ? ? ? ?Objective: ?Vitals:  ? 05/12/21 1639 05/12/21 2039 05/13/21 0521 05/13/21 0940  ?BP: (!) 170/64 (!) 154/56 (!) 165/71 128/87  ?Pulse: 69 67 77 (!) 107  ?Resp: '17 16 15 18  '$ ?Temp: 97.8 ?F (36.6 ?C) 98.2 ?F (36.8 ?C) 98.1 ?F (36.7 ?C)   ?TempSrc: Axillary     ?SpO2: 100%   98%  ?Weight:      ? ? ?Intake/Output Summary (Last 24 hours) at 05/13/2021 1040 ?Last data filed at 05/13/2021 5638 ?Gross per 24 hour  ?Intake 1736.53 ml  ?Output 750 ml  ?Net 986.53 ml  ? ? ?Wt Readings from Last 3 Encounters:  ?05/10/21 60.7 kg  ?05/02/21 58.5 kg  ?04/01/21 65.2 kg  ? ? ?Examination: ? ?Constitutional: NAD ?Eyes: lids and conjunctivae normal, no scleral icterus ?ENMT: mmm ?Neck: normal, supple ?Respiratory: clear to auscultation bilaterally, no wheezing, no crackles. Normal respiratory effort.  ?Cardiovascular: Irregular, no peripheral edema. ?Abdomen: soft, no distention, no tenderness. Bowel sounds positive.  ?Skin: no rashes ?Neurologic: Not cooperative with neuro exam ? ?Data Reviewed: I have independently reviewed following labs and imaging studies  ? ?CBC ?Recent Labs  ?Lab 05/10/21 ?7564 05/10/21 ?3329 05/11/21 ?5188 05/12/21 ?0117 05/13/21 ?0103  ?WBC 11.3*  --  7.0 7.4 9.7  ?HGB 12.2 11.9* 11.0* 10.7* 10.4*  ?HCT 38.6 35.0* 33.9* 33.2* 32.0*  ?PLT 201  --  154 156 183  ?MCV 92.6  --  89.7 90.2 89.1  ?MCH 29.3  --  29.1 29.1 29.0  ?MCHC 31.6  --  32.4 32.2 32.5  ?RDW 15.0  --  15.0 14.9 15.2  ? ? ? ?Recent Labs  ?Lab 05/10/21 ?4166 05/10/21 ?0630 05/10/21 ?1734 05/11/21 ?1601 05/12/21 ?0117 05/13/21 ?0103  ?NA 143 143  --  145 145 144  ?K 4.1 4.0  --  3.1* 3.2* 4.0  ?CL 112*  --   --  116* 120* 121*  ?CO2 20*  --   --  21* 20* 16*  ?GLUCOSE 152*  --   --  127* 157* 159*  ?BUN 27*  --   --  '23 19 16  '$ ?CREATININE 0.87  --   --  0.67 0.57 0.50  ?CALCIUM 9.7  --   --  9.1 8.8* 9.0  ?AST 23  --   --   --   --   --   ?ALT 26  --   --   --   --   --   ?ALKPHOS 111  --   --   --   --   --   ?BILITOT 1.4*  --   --   --    --   --   ?ALBUMIN 2.9*  --   --   --   --   --   ?PROCALCITON  --   --  <0.10 <0.10 <0.10  --   ?LATICACIDVEN 2.4*  --  1.3  --   --   --   ?INR 1.2  --   --   --   --   --   ? ? ? ?------------------------------------------------------------------------------------------------------------------ ?No results for input(s): CHOL, HDL, LDLCALC, TRIG, CHOLHDL,  LDLDIRECT in the last 72 hours. ? ?Lab Results  ?Component Value Date  ? HGBA1C 6.1 (H) 03/01/2021  ? ?------------------------------------------------------------------------------------------------------------------ ?No results for input(s): TSH, T4TOTAL, T3FREE, THYROIDAB in the last 72 hours. ? ?Invalid input(s): FREET3 ? ?Cardiac Enzymes ?No results for input(s): CKMB, TROPONINI, MYOGLOBIN in the last 168 hours. ? ?Invalid input(s): CK ?------------------------------------------------------------------------------------------------------------------ ?   ?Component Value Date/Time  ? BNP 216.5 (H) 02/28/2021 0600  ? ? ?CBG: ?Recent Labs  ?Lab 05/11/21 ?0216  ?GLUCAP 72  ? ? ? ?Recent Results (from the past 240 hour(s))  ?Blood culture (routine x 2)     Status: Abnormal  ? Collection Time: 05/10/21  5:20 AM  ? Specimen: BLOOD  ?Result Value Ref Range Status  ? Specimen Description BLOOD FOOT  Final  ? Special Requests   Final  ?  BOTTLES DRAWN AEROBIC AND ANAEROBIC Blood Culture adequate volume  ? Culture  Setup Time   Final  ?  GRAM POSITIVE COCCI ?AEROBIC BOTTLE ONLY ?CRITICAL VALUE NOTED.  VALUE IS CONSISTENT WITH PREVIOUSLY REPORTED AND CALLED VALUE. ?Performed at [facility], [place] [address], [place], [place] [address] ?  ? Culture STAPHYLOCOCCUS EPIDERMIDIS (A)  Final  ? Report Status 05/13/2021 FINAL  Final  ? Organism ID, Bacteria STAPHYLOCOCCUS EPIDERMIDIS  Final  ?    Susceptibility  ? Staphylococcus epidermidis - MIC*  ?  CIPROFLOXACIN 4 RESISTANT Resistant   ?  ERYTHROMYCIN <=0.25 SENSITIVE Sensitive   ?  GENTAMICIN <=0.5 SENSITIVE Sensitive    ?  OXACILLIN >=4 RESISTANT Resistant   ?  TETRACYCLINE <=1 SENSITIVE Sensitive   ?  VANCOMYCIN 1 SENSITIVE Sensitive   ?  TRIMETH/SULFA 40 SENSITIVE Sensitive   ?  CLINDAMYCIN <=0.25 SENSITIVE Sensitive   ?  RIFAMPIN <=0.5 SENSITIV

## 2021-05-13 NOTE — Progress Notes (Signed)
ANTICOAGULATION CONSULT NOTE - Initial Consult ? ?Pharmacy Consult for apixaban>>enoxaparin  ?Indication: atrial fibrillation ? ?Allergies  ?Allergen Reactions  ? Gabapentin Itching and Other (See Comments)  ?  Made the legs "go numb" and "allergic," per [facility]  ? Other Other (See Comments)  ?  "MD mixed a blood pressure medication with the Clinoril I was taking and I couldn't walk" "made me achy and sick"  ? Penicillins Other (See Comments) and Itching  ?  Has patient had a PCN reaction causing immediate rash, facial/tongue/throat swelling, SOB or lightheadedness with hypotension: Unk- exact reaction not recalled, but is allergic ?Has patient had a PCN reaction causing severe rash involving mucus membranes or skin necrosis: Unk ?Has patient had a PCN reaction that required hospitalization: Unk ?Has patient had a PCN reaction occurring within the last 10 years: Yes ?If all of the above answers are "NO", then may proceed with Cephalosporin use. ? ?Itching and rash  ? Pregabalin Other (See Comments)  ?  "Made me pass out" and "made the limbs go numb"- "allergic," per [facility]  ? Vit D-Vit E-Safflower Oil Other (See Comments)  ?  "Made me achy and sick"  ? ? ?Patient Measurements: ?Weight: 60.7 kg (133 lb 13.1 oz) ? ? ?Vital Signs: ?Temp: 98.1 ?F (36.7 ?C) (04/01 1505) ?BP: 128/87 (04/01 0940) ?Pulse Rate: 107 (04/01 0940) ? ?Labs: ?Recent Labs  ?  05/11/21 ?0608 05/12/21 ?0117 05/13/21 ?0103  ?HGB 11.0* 10.7* 10.4*  ?HCT 33.9* 33.2* 32.0*  ?PLT 154 156 183  ?CREATININE 0.67 0.57 0.50  ? ? ? ?Estimated Creatinine Clearance: 42 mL/min (by C-G formula based on SCr of 0.5 mg/dL). ? ? ?Medical History: ?Past Medical History:  ?Diagnosis Date  ? Arthritis   ? Cellulitis 11/30/2018  ? CNS aneurysm 1983  ? s/p clipping  ? Diabetes mellitus type II   ? diet controlled   ? DVT (deep venous thrombosis) ([place]) 10/15  ? Elevated glucose   ? Hypertension   ? Insomnia   ? Knee pain   ? Left Dr. Meyer Cory  ? LBP (low back pain)   ? PN  (peripheral neuropathy)   ? unknown etiol  ? Scalp laceration   ? Sepsis due to gram-negative UTI ([place]) 03/06/2019  ? Sepsis due to urinary tract infection ([place]) 03/05/2019  ? Skull fracture ([place]) 07/13/2016  ? Traumatic rhabdomyolysis ([place]) 11/30/2018  ? ? ?Medications:  ?Medications Prior to Admission  ?Medication Sig Dispense Refill Last Dose  ? acetaminophen (TYLENOL) 325 MG tablet Take 2 tablets (650 mg total) by mouth every 6 (six) hours as needed for mild pain (or Fever >/= 101). 20 tablet  Past Week  ? apixaban (ELIQUIS) 2.5 MG TABS tablet Take 1 tablet (2.5 mg total) by mouth 2 (two) times daily. 60 tablet  05/09/2021 at 2100  ? busPIRone (BUSPAR) 5 MG tablet Take 1 tablet (5 mg total) by mouth 2 (two) times daily.   05/09/2021 at 2000  ? leptospermum manuka honey (MEDIHONEY) PSTE paste Apply 1 application. topically daily.   05/09/2021  ? metoprolol succinate (TOPROL-XL) 25 MG 24 hr tablet Take 0.5 tablets (12.5 mg total) by mouth daily.   05/09/2021 at 0800  ? Multiple Vitamins-Minerals (DECUBI-VITE) CAPS Take 1 capsule by mouth daily.   05/09/2021  ? polyethylene glycol (MIRALAX / GLYCOLAX) 17 g packet Take 17 g by mouth 2 (two) times daily. 14 each 0 05/09/2021 at 2000  ? senna (SENOKOT) 8.6 MG TABS tablet Take 8.6 mg by mouth  in the morning and at bedtime.   05/09/2021 at 2000  ? sodium phosphate (FLEET) 7-19 GM/118ML ENEM Place 133 mLs (1 enema total) rectally once as needed for severe constipation.  0 unk  ? UNABLE TO FIND Take 90 mLs by mouth 2 (two) times daily. Med Name: Medpass 2.0 , Nutritional supplement   05/09/2021  ? vitamin C (ASCORBIC ACID) 500 MG tablet Take 500 mg by mouth 2 (two) times daily.   05/09/2021  ? ? ?Assessment: ?88 yo female with h/o afib on apixaban PTA. Patient transitioned to enoxaparin while having problems with swallowing and aspiration. Patient was transitioned back to apixaban, but was refusing her dose by clamping her mouth shut on 4/1. Consulted with MD, and pharmacy was  consulted to transition patient back to lovenox.  CBC stable.  ? ?Goal of Therapy:  ?Prevention of stroke ?Monitor platelets by anticoagulation protocol: Yes ?  ?Plan:  ?STOP apixaban  ?START lovenox 90 mg [place] Q24H  ?Daily CBC  ?Monitor for s/sx of bleeding  ? ?Adria Dill, PharmD ?PGY-1 Acute Care Resident  ?05/13/2021 10:47 AM  ? ? ? ?

## 2021-05-13 NOTE — Progress Notes (Signed)
Patient refusing her eliquis. Will not open mouth. MD Gherghe aware. ?

## 2021-05-14 DIAGNOSIS — A419 Sepsis, unspecified organism: Secondary | ICD-10-CM | POA: Diagnosis not present

## 2021-05-14 DIAGNOSIS — J189 Pneumonia, unspecified organism: Secondary | ICD-10-CM | POA: Diagnosis not present

## 2021-05-14 LAB — BASIC METABOLIC PANEL
Anion gap: 6 (ref 5–15)
BUN: 11 mg/dL (ref 8–23)
CO2: 17 mmol/L — ABNORMAL LOW (ref 22–32)
Calcium: 8.8 mg/dL — ABNORMAL LOW (ref 8.9–10.3)
Chloride: 124 mmol/L — ABNORMAL HIGH (ref 98–111)
Creatinine, Ser: 0.59 mg/dL (ref 0.44–1.00)
GFR, Estimated: >60 mL/min (ref >60)
Glucose, Bld: 109 mg/dL — ABNORMAL HIGH (ref 70–99)
Potassium: 3.3 mmol/L — ABNORMAL LOW (ref 3.5–5.1)
Sodium: 147 mmol/L — ABNORMAL HIGH (ref 135–145)

## 2021-05-14 LAB — CBC
HCT: 32 % — ABNORMAL LOW (ref 36.0–46.0)
Hemoglobin: 10.2 g/dL — ABNORMAL LOW (ref 12.0–15.0)
MCH: 28.6 pg (ref 26.0–34.0)
MCHC: 31.9 g/dL (ref 30.0–36.0)
MCV: 89.6 fL (ref 80.0–100.0)
Platelets: 214 10*3/uL (ref 150–400)
RBC: 3.57 MIL/uL — ABNORMAL LOW (ref 3.87–5.11)
RDW: 15.6 % — ABNORMAL HIGH (ref 11.5–15.5)
WBC: 6.7 10*3/uL (ref 4.0–10.5)
nRBC: 0 % (ref 0.0–0.2)

## 2021-05-14 MED ORDER — POTASSIUM CHLORIDE 10 MEQ/100ML IV SOLN
10.0000 meq | INTRAVENOUS | Status: AC
  Administered 2021-05-14 (×4): 10 meq via INTRAVENOUS
  Filled 2021-05-14 (×4): qty 100

## 2021-05-14 MED ORDER — GLYCOPYRROLATE 0.2 MG/ML IJ SOLN
0.1000 mg | Freq: Two times a day (BID) | INTRAMUSCULAR | Status: DC
  Administered 2021-05-14 – 2021-05-17 (×6): 0.1 mg via INTRAVENOUS
  Filled 2021-05-14 (×6): qty 1

## 2021-05-14 MED ORDER — DEXTROSE IN LACTATED RINGERS 5 % IV SOLN: INTRAVENOUS | Status: DC

## 2021-05-14 MED ORDER — MORPHINE SULFATE (PF) 2 MG/ML IV SOLN
2.0000 mg | INTRAVENOUS | Status: DC | PRN
  Administered 2021-05-15: 2 mg via INTRAVENOUS
  Filled 2021-05-14: qty 1

## 2021-05-14 MED ORDER — LORAZEPAM 2 MG/ML IJ SOLN: 0.5000 mg | INTRAMUSCULAR | Status: DC | PRN

## 2021-05-14 NOTE — Progress Notes (Signed)
Pharmacy Antibiotic Note ? ?Kristina Wilkinson is a 88 y.o. female admitted on 05/10/2021 with pneumonia and possible UTI. Patient has postive blood cultures with 3/4 MRSE and question whether it is a contaminant. Urine cultres also growing providencia and e. Faecalis. Spoke with Dr. Cruzita Lederer and said he would likely speak to ID about positive blood cultures and need for vancomycin. Pharmacy has been consulted for vancomycin dosing.  ? ?4/2: patient is afebrile, WBC WNL. Of note, patient is demented and has been refusing oral medications.  ? ?Plan: ?Vancomycin '750mg'$  IV Q24H ?Ceftriaxone 2g IV Q24H  ?F/u renal fxn, C&S, clinical status and peak/trough at [facility], ability to de-escalate  ? ? ?Temp (24hrs), Avg:98.4 ?F (36.9 ?C), Min:97.5 ?F (36.4 ?C), Max:99.5 ?F (37.5 ?C) ? ?Recent Labs  ?Lab 05/10/21 ?8469 05/10/21 ?1734 05/11/21 ?6295 05/12/21 ?0117 05/13/21 ?0103 05/14/21 ?0403  ?WBC 11.3*  --  7.0 7.4 9.7 6.7  ?CREATININE 0.87  --  0.67 0.57 0.50 0.59  ?LATICACIDVEN 2.4* 1.3  --   --   --   --   ? ?  ?Estimated Creatinine Clearance: 42 mL/min (by C-G formula based on SCr of 0.59 mg/dL).   ? ?Allergies  ?Allergen Reactions  ? Gabapentin Itching and Other (See Comments)  ?  Made the legs "go numb" and "allergic," per [facility]  ? Other Other (See Comments)  ?  "MD mixed a blood pressure medication with the Clinoril I was taking and I couldn't walk" "made me achy and sick"  ? Penicillins Other (See Comments) and Itching  ?  Has patient had a PCN reaction causing immediate rash, facial/tongue/throat swelling, SOB or lightheadedness with hypotension: Unk- exact reaction not recalled, but is allergic ?Has patient had a PCN reaction causing severe rash involving mucus membranes or skin necrosis: Unk ?Has patient had a PCN reaction that required hospitalization: Unk ?Has patient had a PCN reaction occurring within the last 10 years: Yes ?If all of the above answers are "NO", then may proceed with Cephalosporin use. ? ?Itching and rash  ?  Pregabalin Other (See Comments)  ?  "Made me pass out" and "made the limbs go numb"- "allergic," per [facility]  ? Vit D-Vit E-Safflower Oil Other (See Comments)  ?  "Made me achy and sick"  ? ? ?Antimicrobials this admission: ?Vanc 3/29>> ?CTX 4/1>>  ?Cefepime 3/29>>4/1 ?Flagyl 3/29>>4/1 ? ?Dose adjustments this admission: ?N/A ? ?Microbiology results: ?4/1 Blood: sent  ?3/29 blood: 3/4 GPC - BCID staph epi with MecA resistance ?3/29 urine: providencia stuartii (S-CTX), E. Faecalis (pan-S) ?3/29 Strep Ag: neg ?3/29 Legionella Ag: neg ? ?Adria Dill, PharmD ?PGY-1 Acute Care Resident  ?05/14/2021 7:41 AM  ? ? ?

## 2021-05-14 NOTE — Progress Notes (Addendum)
?PROGRESS NOTE ? ?Kristina Wilkinson:[id number] DOB: 1928/01/06 DOA: 05/10/2021 ?PCP: Pcp, No ? ? LOS: 4 days  ? ?Brief Narrative / Interim history: ?88 year old female with history of dementia, DM2, HTN, A-fib comes into the hospital with shortness of breath, concern for aspiration event.  She has had recent couple of admissions 1 in February this year another one in March with similar presentation, along with dehydration.  She was seen by palliative in the past as well.  Admitted discussed goals of care with the POA, and would like medical treatment, and retaining DNR ? ?Subjective / 24h Interval events: ?Less responsive today  ? ?Assesement and Plan: ?Principal Problem: ?  Sepsis due to pneumonia [facility]) ?Active Problems: ?  Aspiration pneumonia ([place]) ?  Atrial fibrillation, chronic ([place]) ?  HTN (hypertension) ?  DM2 (diabetes mellitus, type 2) ([place]) ?  Dementia with behavioral disturbance ([place]) ?  Acute metabolic encephalopathy ?  DNR (do not resuscitate) ? ? ?Assessment and Plan: ?Principal problem ?Sepsis due to pneumonia [facility]), possible UTI, possible bacteremia-continue antibiotics, 3/4 bottles showed staph epi, possibly contaminant but not sure.  Repeat blood cultures on 4/1, follow.  Discussed with ID, hard to say whether it is a contaminant. ID will ask micro to run susceptibilities for the rest of the bottles. Keep on antibiotics for now. Procalcitonin is negative, white count is normal and she is afebrile currently.  Urine cultures with 2 species, procidentia and Enterococcus.  Currently on Vancomycin and Ceftriaxone.  ? ?Active problems ?Atrial fibrillation, chronic ([place]) -She is usually rate controlled with Toprol XL.  This was held on admission due to bradycardia with rates into the 60s as well as concern for sepsis.  Heart rate on 4/1 100-110, since she is refusing p.o. medications intermittently will place on low-dose IV metoprolol.  Continue Lovenox instead of Eliquis, will switch to pills when she will  take p.o. more consistently ? ?Goals of care - it is becoming more apparent that despite ongoing treatment with fluids, antibiotics, she is not improving. She is more sleepy today. She has had little to no po intake and refusing medications. I think she is approaching end of life and comfort is appropriate at this time. I have relied my impression to the POA who had a chance to visit today. After discussion with him, given lack or response will stop antibiotics, fluids and focus on comfort. May be in-hospital death based on how she appears today. ? ?Possible UTI-cannot assess for symptoms, but given nausea and vomiting prior to admission would favor to treat.  Continue antibiotics as above ? ?Acute metabolic encephalopathy -due to #1. Mental status worse again today ? ?Dementia with behavioral disturbance -Progressive dementia, per discussion with family ? ?DM2 (diabetes mellitus, type 2) (HCC) -Recent A1c was 6.1, indicating good control.  Monitor CBGs periodically ? ?HTN (hypertension)-on metoprolol IV ? ? ?Scheduled Meds: ? docusate sodium  100 mg Oral BID  ? enoxaparin (LOVENOX) injection  90 mg Subcutaneous Q24H  ? leptospermum manuka honey  1 application. Topical Daily  ? metoprolol tartrate  2.5 mg Intravenous Q8H  ? sodium chloride flush  3 mL Intravenous Q12H  ? ?Continuous Infusions: ? cefTRIAXone (ROCEPHIN)  IV Stopped (05/13/21 2228)  ? dextrose 5% lactated ringers 75 mL/hr at 05/14/21 0814  ? potassium chloride 10 mEq (05/14/21 1039)  ? vancomycin 750 mg (05/14/21 1038)  ? ?PRN Meds:.acetaminophen **OR** acetaminophen, albuterol, bisacodyl, guaiFENesin, haloperidol lactate, hydrALAZINE, ondansetron **OR** ondansetron (ZOFRAN) IV, polyethylene glycol ? ?Diet  Orders (From admission, onward)  ? ?  Start     Ordered  ? 05/11/21 0936  DIET - DYS 1 Room service appropriate? Yes; Fluid consistency: Thin  Diet effective now       ?Comments: Full assist with feeding ?Meds crushed in puree  ?Question Answer  Comment  ?Room service appropriate? Yes   ?Fluid consistency: Thin   ?  ? 05/11/21 0935  ? ?  ?  ? ?  ? ? ?DVT prophylaxis:  ? ? ?Lab Results  ?Component Value Date  ? PLT 214 05/14/2021  ? ? ?  Code Status: DNR ? ?Family Communication: d/w POA on 4/1 ? ?Status is: Inpatient ? ?Remains inpatient appropriate because: severity of illness ? ?Level of care: Telemetry Medical ? ?Consultants:  ?none ? ?Procedures:  ?None ? ? ? ?Objective: ?Vitals:  ? 05/13/21 2018 05/14/21 0515 05/14/21 0550 05/14/21 1657  ?BP: 112/76 (!) 154/129 (!) 147/115 138/87  ?Pulse: 85 (!) 42 77 79  ?Resp: '20 18 16 18  '$ ?Temp: 98.1 ?F (36.7 ?C) 99.5 ?F (37.5 ?C)  (!) 97.5 ?F (36.4 ?C)  ?TempSrc: Axillary Oral  Oral  ?SpO2: 98% 99% 97% 100%  ?Weight:      ? ? ?Intake/Output Summary (Last 24 hours) at 05/14/2021 1044 ?Last data filed at 05/14/2021 9038 ?Gross per 24 hour  ?Intake 2030.43 ml  ?Output 1050 ml  ?Net 980.43 ml  ? ? ?Wt Readings from Last 3 Encounters:  ?05/10/21 60.7 kg  ?05/02/21 58.5 kg  ?04/01/21 65.2 kg  ? ? ?Examination: ? ?Constitutional: obtunded ?Eyes: lids and conjunctivae normal, no scleral icterus ?ENMT: mmm ?Neck: normal, supple ?Respiratory: clear to auscultation bilaterally, no wheezing, no crackles. Normal respiratory effort.  ?Cardiovascular: Regular rate and rhythm, no murmurs / rubs / gallops. No LE edema. ?Abdomen: soft, no distention, no tenderness. Bowel sounds positive.  ?Skin: no rashes ?Neurologic: no focal deficits, equal strength ? ?Data Reviewed: I have independently reviewed following labs and imaging studies  ? ?CBC ?Recent Labs  ?Lab 05/10/21 ?3338 05/10/21 ?3291 05/11/21 ?9166 05/12/21 ?0117 05/13/21 ?0103 05/14/21 ?0403  ?WBC 11.3*  --  7.0 7.4 9.7 6.7  ?HGB 12.2 11.9* 11.0* 10.7* 10.4* 10.2*  ?HCT 38.6 35.0* 33.9* 33.2* 32.0* 32.0*  ?PLT 201  --  154 156 183 214  ?MCV 92.6  --  89.7 90.2 89.1 89.6  ?MCH 29.3  --  29.1 29.1 29.0 28.6  ?MCHC 31.6  --  32.4 32.2 32.5 31.9  ?RDW 15.0  --  15.0 14.9 15.2 15.6*   ? ? ? ?Recent Labs  ?Lab 05/10/21 ?0600 05/10/21 ?4599 05/10/21 ?1734 05/11/21 ?7741 05/12/21 ?0117 05/13/21 ?0103 05/14/21 ?0403  ?NA 143 143  --  145 145 144 147*  ?K 4.1 4.0  --  3.1* 3.2* 4.0 3.3*  ?CL 112*  --   --  116* 120* 121* 124*  ?CO2 20*  --   --  21* 20* 16* 17*  ?GLUCOSE 152*  --   --  127* 157* 159* 109*  ?BUN 27*  --   --  '23 19 16 11  '$ ?CREATININE 0.87  --   --  0.67 0.57 0.50 0.59  ?CALCIUM 9.7  --   --  9.1 8.8* 9.0 8.8*  ?AST 23  --   --   --   --   --   --   ?ALT 26  --   --   --   --   --   --   ?  ALKPHOS 111  --   --   --   --   --   --   ?BILITOT 1.4*  --   --   --   --   --   --   ?ALBUMIN 2.9*  --   --   --   --   --   --   ?PROCALCITON  --   --  <0.10 <0.10 <0.10  --   --   ?LATICACIDVEN 2.4*  --  1.3  --   --   --   --   ?INR 1.2  --   --   --   --   --   --   ? ? ? ?------------------------------------------------------------------------------------------------------------------ ?No results for input(s): CHOL, HDL, LDLCALC, TRIG, CHOLHDL, LDLDIRECT in the last 72 hours. ? ?Lab Results  ?Component Value Date  ? HGBA1C 6.1 (H) 03/01/2021  ? ?------------------------------------------------------------------------------------------------------------------ ?No results for input(s): TSH, T4TOTAL, T3FREE, THYROIDAB in the last 72 hours. ? ?Invalid input(s): FREET3 ? ?Cardiac Enzymes ?No results for input(s): CKMB, TROPONINI, MYOGLOBIN in the last 168 hours. ? ?Invalid input(s): CK ?------------------------------------------------------------------------------------------------------------------ ?   ?Component Value Date/Time  ? BNP 216.5 (H) 02/28/2021 0600  ? ? ?CBG: ?Recent Labs  ?Lab 05/11/21 ?0216 05/13/21 ?2022  ?GLUCAP 72 114*  ? ? ? ?Recent Results (from the past 240 hour(s))  ?Blood culture (routine x 2)     Status: Abnormal  ? Collection Time: 05/10/21  5:20 AM  ? Specimen: BLOOD  ?Result Value Ref Range Status  ? Specimen Description BLOOD FOOT  Final  ? Special Requests   Final  ?   BOTTLES DRAWN AEROBIC AND ANAEROBIC Blood Culture adequate volume  ? Culture  Setup Time   Final  ?  GRAM POSITIVE COCCI ?AEROBIC BOTTLE ONLY ?CRITICAL VALUE NOTED.  VALUE IS CONSISTENT WITH PREVIOUSLY REPOR

## 2021-05-15 DIAGNOSIS — A419 Sepsis, unspecified organism: Secondary | ICD-10-CM | POA: Diagnosis not present

## 2021-05-15 DIAGNOSIS — J189 Pneumonia, unspecified organism: Secondary | ICD-10-CM | POA: Diagnosis not present

## 2021-05-15 NOTE — Progress Notes (Signed)
SLP Cancellation Note ? ?Patient Details ?Name: Kristina Wilkinson ?MRN: [id number] ?DOB: Aug 17, 1927 ? ? ?Cancelled treatment:       Reason Eval/Treat Not Completed: Other (comment) Per MD note pt is obtunded and as of 4/2, family has opted to transition to full comfort care. SLP to sign off acutely. Please reorder if needed.  ? ? ? ?Osie Bond., M.A. CCC-SLP ?Acute Rehabilitation Services ?Pager [phone] ?Office [phone] ? ?05/15/2021, 2:35 PM ?

## 2021-05-15 NOTE — Progress Notes (Signed)
?PROGRESS NOTE ? ?Kristina Wilkinson UYQ:[id number] DOB: 12/22/1927 DOA: 05/10/2021 ?PCP: Pcp, No ? ? LOS: 5 days  ? ?Brief Narrative / Interim history: ?88 year old female with history of dementia, DM2, HTN, A-fib comes into the hospital with shortness of breath, concern for aspiration event.  She has had recent couple of admissions 1 in February this year another one in March with similar presentation, along with dehydration.  She was seen by palliative in the past as well.  She has been started on fluids, antibiotics but did not respond to treatment and after discussing with the POA she was transitioned to comfort care on 4/20 ? ?Subjective / 24h Interval events: ?Remains obtunded ? ?Assesement and Plan: ?Principal Problem: ?  Sepsis due to pneumonia [facility]) ?Active Problems: ?  Aspiration pneumonia ([place]) ?  Atrial fibrillation, chronic ([place]) ?  HTN (hypertension) ?  DM2 (diabetes mellitus, type 2) ([place]) ?  Dementia with behavioral disturbance ([place]) ?  Acute metabolic encephalopathy ?  DNR (do not resuscitate) ? ? ?Assessment and Plan: ?Principal problem ?Sepsis due to pneumonia [facility]), possible UTI, Staph epidermidis bacteremia-she was maintained on broad-spectrum antibiotics, with given lack of response, after discussing with the POA she will be transitioned to comfort care.  Anticipating hospital death ? ?Active problems ?Atrial fibrillation, chronic ([place]) ?Goals of care  ?End-of-life care ?Providencia, Enterococcus UTI ?Acute metabolic encephalopathy ?Dementia with behavioral disturbance  ?DM2  ?HTN  ?Hypokalemia ?Anemia of chronic illness ?Hypernatremia ? ? ?Scheduled Meds: ? docusate sodium  100 mg Oral BID  ? glycopyrrolate  0.1 mg Intravenous BID  ? leptospermum manuka honey  1 application. Topical Daily  ? sodium chloride flush  3 mL Intravenous Q12H  ? ?Continuous Infusions: ? ? ?PRN Meds:.acetaminophen **OR** acetaminophen, albuterol, bisacodyl, guaiFENesin, haloperidol lactate, LORazepam, morphine injection,  ondansetron **OR** ondansetron (ZOFRAN) IV, polyethylene glycol ? ?Diet Orders (From admission, onward)  ? ?  Start     Ordered  ? 05/11/21 0936  DIET - DYS 1 Room service appropriate? Yes; Fluid consistency: Thin  Diet effective now       ?Comments: Full assist with feeding ?Meds crushed in puree  ?Question Answer Comment  ?Room service appropriate? Yes   ?Fluid consistency: Thin   ?  ? 05/11/21 0935  ? ?  ?  ? ?  ? ? ?DVT prophylaxis:  ? ? ?Lab Results  ?Component Value Date  ? PLT 214 05/14/2021  ? ? ?  Code Status: DNR ? ?Family Communication: d/w POA on 4/2 ? ?Status is: Inpatient. Anticipate in-hospital death ? ?Level of care: Telemetry Medical ? ?Consultants:  ?none ? ?Procedures:  ?None ? ? ?Objective: ?Vitals:  ? 05/14/21 0550 05/14/21 0938 05/14/21 1503 05/15/21 0532  ?BP: (!) 147/115 138/87 (!) 163/74 (!) 168/58  ?Pulse: 77 79 88 83  ?Resp: '16 18 16 15  '$ ?Temp:  (!) 97.5 ?F (36.4 ?C) 98.8 ?F (37.1 ?C) 98.2 ?F (36.8 ?C)  ?TempSrc:  Oral Axillary Axillary  ?SpO2: 97% 100% 100% 99%  ?Weight:      ? ? ?Intake/Output Summary (Last 24 hours) at 05/15/2021 1026 ?Last data filed at 05/15/2021 0700 ?Gross per 24 hour  ?Intake 1340.74 ml  ?Output 800 ml  ?Net 540.74 ml  ? ? ?Wt Readings from Last 3 Encounters:  ?05/10/21 60.7 kg  ?05/02/21 58.5 kg  ?04/01/21 65.2 kg  ? ? ?Examination: ? ?Constitutional: Remains obtunded ? ?Data Reviewed: I have independently reviewed following labs and imaging studies  ? ?CBC ?Recent Labs  ?  Lab 05/10/21 ?7989 05/10/21 ?2119 05/11/21 ?4174 05/12/21 ?0117 05/13/21 ?0103 05/14/21 ?0403  ?WBC 11.3*  --  7.0 7.4 9.7 6.7  ?HGB 12.2 11.9* 11.0* 10.7* 10.4* 10.2*  ?HCT 38.6 35.0* 33.9* 33.2* 32.0* 32.0*  ?PLT 201  --  154 156 183 214  ?MCV 92.6  --  89.7 90.2 89.1 89.6  ?MCH 29.3  --  29.1 29.1 29.0 28.6  ?MCHC 31.6  --  32.4 32.2 32.5 31.9  ?RDW 15.0  --  15.0 14.9 15.2 15.6*  ? ? ? ?Recent Labs  ?Lab 05/10/21 ?0814 05/10/21 ?4818 05/10/21 ?1734 05/11/21 ?5631 05/12/21 ?0117 05/13/21 ?0103  05/14/21 ?0403  ?NA 143 143  --  145 145 144 147*  ?K 4.1 4.0  --  3.1* 3.2* 4.0 3.3*  ?CL 112*  --   --  116* 120* 121* 124*  ?CO2 20*  --   --  21* 20* 16* 17*  ?GLUCOSE 152*  --   --  127* 157* 159* 109*  ?BUN 27*  --   --  '23 19 16 11  '$ ?CREATININE 0.87  --   --  0.67 0.57 0.50 0.59  ?CALCIUM 9.7  --   --  9.1 8.8* 9.0 8.8*  ?AST 23  --   --   --   --   --   --   ?ALT 26  --   --   --   --   --   --   ?ALKPHOS 111  --   --   --   --   --   --   ?BILITOT 1.4*  --   --   --   --   --   --   ?ALBUMIN 2.9*  --   --   --   --   --   --   ?PROCALCITON  --   --  <0.10 <0.10 <0.10  --   --   ?LATICACIDVEN 2.4*  --  1.3  --   --   --   --   ?INR 1.2  --   --   --   --   --   --   ? ? ? ?------------------------------------------------------------------------------------------------------------------ ?No results for input(s): CHOL, HDL, LDLCALC, TRIG, CHOLHDL, LDLDIRECT in the last 72 hours. ? ?Lab Results  ?Component Value Date  ? HGBA1C 6.1 (H) 03/01/2021  ? ?------------------------------------------------------------------------------------------------------------------ ?No results for input(s): TSH, T4TOTAL, T3FREE, THYROIDAB in the last 72 hours. ? ?Invalid input(s): FREET3 ? ?Cardiac Enzymes ?No results for input(s): CKMB, TROPONINI, MYOGLOBIN in the last 168 hours. ? ?Invalid input(s): CK ?------------------------------------------------------------------------------------------------------------------ ?   ?Component Value Date/Time  ? BNP 216.5 (H) 02/28/2021 0600  ? ? ?CBG: ?Recent Labs  ?Lab 05/11/21 ?0216 05/13/21 ?2022  ?GLUCAP 72 114*  ? ? ? ?Recent Results (from the past 240 hour(s))  ?Blood culture (routine x 2)     Status: Abnormal  ? Collection Time: 05/10/21  5:20 AM  ? Specimen: BLOOD  ?Result Value Ref Range Status  ? Specimen Description BLOOD FOOT  Final  ? Special Requests   Final  ?  BOTTLES DRAWN AEROBIC AND ANAEROBIC Blood Culture adequate volume  ? Culture  Setup Time   Final  ?  GRAM POSITIVE  COCCI ?AEROBIC BOTTLE ONLY ?CRITICAL VALUE NOTED.  VALUE IS CONSISTENT WITH PREVIOUSLY REPORTED AND CALLED VALUE. ?Performed at [facility], [place] [address], [place], [place] [address] ?  ? Culture STAPHYLOCOCCUS EPIDERMIDIS (A)  Final  ?  Report Status 05/13/2021 FINAL  Final  ? Organism ID, Bacteria STAPHYLOCOCCUS EPIDERMIDIS  Final  ?    Susceptibility  ? Staphylococcus epidermidis - MIC*  ?  CIPROFLOXACIN 4 RESISTANT Resistant   ?  ERYTHROMYCIN <=0.25 SENSITIVE Sensitive   ?  GENTAMICIN <=0.5 SENSITIVE Sensitive   ?  OXACILLIN >=4 RESISTANT Resistant   ?  TETRACYCLINE <=1 SENSITIVE Sensitive   ?  VANCOMYCIN 1 SENSITIVE Sensitive   ?  TRIMETH/SULFA 40 SENSITIVE Sensitive   ?  CLINDAMYCIN <=0.25 SENSITIVE Sensitive   ?  RIFAMPIN <=0.5 SENSITIVE Sensitive   ?  Inducible Clindamycin NEGATIVE Sensitive   ?  * STAPHYLOCOCCUS EPIDERMIDIS  ?Blood culture (routine x 2)     Status: Abnormal (Preliminary result)  ? Collection Time: 05/10/21  5:25 AM  ? Specimen: BLOOD LEFT FOREARM  ?Result Value Ref Range Status  ? Specimen Description BLOOD LEFT FOREARM  Final  ? Special Requests   Final  ?  BOTTLES DRAWN AEROBIC AND ANAEROBIC Blood Culture adequate volume  ? Culture  Setup Time   Final  ?  GRAM POSITIVE COCCI IN CLUSTERS ?IN BOTH AEROBIC AND ANAEROBIC BOTTLES ?CRITICAL RESULT CALLED TO, READ BACK BY AND VERIFIED WITH: T RUDISILL,PHARMD'@0256'$  05/11/21 [place] ?  ? Culture (A)  Final  ?  STAPHYLOCOCCUS EPIDERMIDIS ?SUSCEPTIBILITIES TO FOLLOW ?Performed at [facility], [place] [address] [place], [place] [address] ?  ? Report Status PENDING  Incomplete  ?Blood Culture ID Panel (Reflexed)     Status: Abnormal  ? Collection Time: 05/10/21  5:25 AM  ?Result Value Ref Range Status  ? Enterococcus faecalis NOT DETECTED NOT DETECTED Final  ? Enterococcus Faecium NOT DETECTED NOT DETECTED Final  ? Listeria monocytogenes NOT DETECTED NOT DETECTED Final  ? Staphylococcus species DETECTED (A) NOT DETECTED Final  ?  Comment:  CRITICAL RESULT CALLED TO, READ BACK BY AND VERIFIED WITH: ?T RUDISILL,PHARMD'@0255'$  05/11/21 [place] ?  ? Staphylococcus aureus (BCID) NOT DETECTED NOT DETECTED Final  ? Staphylococcus epidermidis DETECTED (A) NOT DETE

## 2021-05-15 NOTE — Plan of Care (Signed)
?  Problem: Education: ?Goal: Knowledge of the prescribed therapeutic regimen will improve ?Outcome: Progressing ?  ?Problem: Respiratory: ?Goal: Verbalizations of increased ease of respirations will increase ?Outcome: Progressing ?  ?Problem: Pain Management: ?Goal: Satisfaction with pain management regimen will improve ?Outcome: Progressing ?  ?

## 2021-05-16 DIAGNOSIS — J189 Pneumonia, unspecified organism: Secondary | ICD-10-CM | POA: Diagnosis not present

## 2021-05-16 DIAGNOSIS — A419 Sepsis, unspecified organism: Secondary | ICD-10-CM | POA: Diagnosis not present

## 2021-05-16 LAB — CULTURE, BLOOD (ROUTINE X 2): Special Requests: ADEQUATE

## 2021-05-16 NOTE — TOC Progression Note (Signed)
Transition of Care (TOC) - Initial/Assessment Note  ? ? ?Patient Details  ?Name: Kristina Wilkinson DOBOSZ ?MRN: [id number] ?Date of Birth: November 25, 1927 ? ?Transition of Care (TOC) CM/SW Contact:    ?Paulene Floor Teira Arcilla, LCSWA ?Phone Number: ?05/16/2021, 4:31 PM ? ?Clinical Narrative:                 ?CSW received notification from MD that the family would like to pursue Residential Hospice.  CSW made referral to [facility].  They do not have a bed available today. ? ?CSW was directed by [facility] director, Nathaniel Man, to speak with MD and family about the patient returning to the facility with hospice. ? ?CSW contacted the POA.  POA is agreeable to the patient returning to [place] [facility], if [facility] still does not have a bed available. ? ?CSW contacted Janie with admissions and informed her of the above information.  The facility can accept the patient tomorrow.   ? ?  ?  ? ? ?Patient Goals and CMS Choice ?  ?  ?  ? ?Expected Discharge Plan and Services ?  ?  ?  ?  ?  ?                ?  ?  ?  ?  ?  ?  ?  ?  ?  ?  ? ?Prior Living Arrangements/Services ?  ?  ?  ?       ?  ?  ?  ?  ? ?Activities of Daily Living ?  ?  ? ?Permission Sought/Granted ?  ?  ?   ?   ?   ?   ? ?Emotional Assessment ?  ?  ?  ?  ?  ?  ? ?Admission diagnosis:  Lactic acidosis [E87.20] ?Acute respiratory failure with hypoxia ([place]) [J96.01] ?Altered mental status, unspecified altered mental status type [R41.82] ?Sepsis due to pneumonia ([place]) [J18.9, A41.9] ?Aspiration pneumonia of right lower lobe, unspecified aspiration pneumonia type ([place]) [J69.0] ?Patient Active Problem List  ? Diagnosis Date Noted  ? Sepsis due to pneumonia ([place]) 05/10/2021  ? Aspiration pneumonia ([place]) 05/10/2021  ? Acute metabolic encephalopathy 24/10/7351  ? DNR (do not resuscitate) 05/10/2021  ? Dementia with behavioral disturbance ([place]) 05/08/2021  ? Palliative care encounter 05/08/2021  ? Moderate protein-calorie malnutrition ([place]) 05/08/2021  ? Constipation 04/30/2021  ? Hypothermia  04/29/2021  ? Acute encephalopathy 03/29/2021  ? Pressure injury of skin 03/01/2021  ? Sepsis secondary to UTI ([place]) 02/28/2021  ? Dementia without behavioral disturbance ([place]) 02/28/2021  ? Normocytic anemia 02/28/2021  ? Atrial fibrillation, chronic ([place]) 09/25/2020  ? Restless leg syndrome 03/14/2019  ? Falls 03/05/2019  ? Open toe wound 12/01/2018  ? Transaminitis 11/30/2018  ? Abnormal urinalysis 11/30/2018  ? Leukocytosis 11/30/2018  ? Hypokalemia 11/30/2018  ? Chronic left shoulder pain 04/03/2016  ? DM2 (diabetes mellitus, type 2) ([place]) 06/27/2015  ? Hearing loss 06/21/2015  ? Advance care planning 12/17/2014  ? Palpitations 07/06/2014  ? Pain in joint, ankle and foot 04/20/2014  ? DVT, lower extremity ([place]) 11/19/2013  ? Edema 10/20/2013  ? Headache(784.0) 10/20/2013  ? Left leg swelling 10/20/2013  ? Aneurysm of basilar artery (HCC) 10/20/2013  ? Bradycardia 10/20/2013  ? Vitamin B12 deficiency 07/16/2013  ? Paresthesia of both feet 05/17/2013  ? TMJ arthritis 09/11/2012  ? Ear pain 09/11/2012  ? History of transfusion of packed red blood cells 09/11/2012  ? Spinal stenosis 04/13/2011  ? Well adult exam  01/23/2011  ? Cholecystitis with cholelithiasis, s/p lap chole 11/10/2010 12/01/2010  ? Shoulder pain, right 07/25/2010  ? TOBACCO USE, QUIT 03/09/2009  ? KNEE PAIN 05/18/2008  ? PN (peripheral neuropathy) 08/19/2007  ? LEG PAIN 08/19/2007  ? Vitamin D deficiency 02/17/2007  ? INSOMNIA, PERSISTENT 02/17/2007  ? HTN (hypertension) 02/17/2007  ? GERD 02/17/2007  ? Osteoarthritis 02/17/2007  ? LOW BACK PAIN 02/17/2007  ? Hyperglycemia 01/28/2007  ? UNSPECIFIED OSTEOPOROSIS 01/28/2007  ? PRECIPITOUS DROP IN HEMATOCRIT 01/28/2007  ? ?PCP:  Pcp, No ?Pharmacy:   ?[facility] ?[place] ?[place] [address] ?Phone: [phone] Fax: [phone] ? ? ? ? ?Social Determinants of Health (SDOH) Interventions ?  ? ?Readmission Risk Interventions ? ?   03/30/2021  ?  2:14 PM 03/01/2021  ?  2:52 PM  ?Readmission Risk Prevention Plan  ?Transportation Screening Complete Complete  ?PCP or Specialist Appt within 3-5 Days  Complete  ?[place] or Home Care Consult  Complete  ?Social Work Consult for [place] Planning/Counseling  Complete  ?Palliative Care Screening  Not Applicable  ?Medication Review Press photographer) Complete Complete  ?PCP or Specialist appointment within 3-5 days of discharge Complete   ?[place] or Home Care Consult Complete   ?SW Recovery Care/Counseling Consult Complete   ?Palliative Care Screening Not Applicable   ?Skilled Nursing Facility Complete   ? ? ? ?

## 2021-05-16 NOTE — Plan of Care (Signed)
?  Problem: Respiratory: ?Goal: Ability to maintain adequate ventilation will improve ?Outcome: Progressing ?Goal: Ability to maintain a clear airway will improve ?Outcome: Progressing ?  ?

## 2021-05-16 NOTE — Progress Notes (Signed)
?PROGRESS NOTE ? ?Kristina Wilkinson:[id number] DOB: 1927/07/08 DOA: 05/10/2021 ?PCP: Pcp, No ? ? LOS: 6 days  ? ?Brief Narrative / Interim history: ?88 year old female with history of dementia, DM2, HTN, A-fib comes into the hospital with shortness of breath, concern for aspiration event.  She has had recent couple of admissions 1 in February this year another one in March with similar presentation, along with dehydration.  She was seen by palliative in the past as well.  She has been started on fluids, antibiotics but did not respond to treatment and after discussing with the POA she was transitioned to comfort care on 4/20 ? ?Subjective / 24h Interval events: ?Obtunded. Appears comfortable ? ?Assesement and Plan: ?Principal Problem: ?  Sepsis due to pneumonia [facility]) ?Active Problems: ?  Aspiration pneumonia ([place]) ?  Atrial fibrillation, chronic ([place]) ?  HTN (hypertension) ?  DM2 (diabetes mellitus, type 2) ([place]) ?  Dementia with behavioral disturbance ([place]) ?  Acute metabolic encephalopathy ?  DNR (do not resuscitate) ? ? ?Assessment and Plan: ?Principal problem ?Sepsis due to pneumonia [facility]), possible UTI, Staph epidermidis bacteremia-she was maintained on broad-spectrum antibiotics, with given lack of response, after discussing with the POA she will be transitioned to comfort care.  Remains somewhat stable, pursuing residential hospice.  ? ?Active problems ?Atrial fibrillation, chronic ([place]) ?Goals of care  ?End-of-life care ?[place], Enterococcus UTI ?Acute metabolic encephalopathy ?Dementia with behavioral disturbance  ?DM2  ?HTN  ?Hypokalemia ?Anemia of chronic illness ?Hypernatremia ? ? ?Scheduled Meds: ? docusate sodium  100 mg Oral BID  ? glycopyrrolate  0.1 mg Intravenous BID  ? leptospermum manuka honey  1 application. Topical Daily  ? sodium chloride flush  3 mL Intravenous Q12H  ? ?Continuous Infusions: ? ? ?PRN Meds:.acetaminophen **OR** acetaminophen, albuterol, bisacodyl, guaiFENesin, haloperidol  lactate, LORazepam, morphine injection, ondansetron **OR** ondansetron (ZOFRAN) IV, polyethylene glycol ? ?Diet Orders (From admission, onward)  ? ?  Start     Ordered  ? 05/11/21 0936  DIET - DYS 1 Room service appropriate? Yes; Fluid consistency: Thin  Diet effective now       ?Comments: Full assist with feeding ?Meds crushed in puree  ?Question Answer Comment  ?Room service appropriate? Yes   ?Fluid consistency: Thin   ?  ? 05/11/21 0935  ? ?  ?  ? ?  ? ? ?DVT prophylaxis:  ? ? ?Lab Results  ?Component Value Date  ? PLT 214 05/14/2021  ? ? ?  Code Status: DNR ? ?Family Communication: d/w POA on 4/4 ? ?Status is: Inpatient. Anticipate in-hospital death vs residential hospice if she gets a bed ? ?Level of care: Telemetry Medical ? ?Consultants:  ?none ? ?Procedures:  ?None ? ? ?Objective: ?Vitals:  ? 05/15/21 1652 05/15/21 2012 05/16/21 0510 05/16/21 0913  ?BP: (!) 154/75 (!) 134/93 (!) 144/79 (!) 150/71  ?Pulse: 78 88 93 74  ?Resp: 17 (!) '22 18 18  '$ ?Temp: 98.2 ?F (36.8 ?C) 97.9 ?F (36.6 ?C) 98 ?F (36.7 ?C) 98.2 ?F (36.8 ?C)  ?TempSrc: Oral Oral    ?SpO2: 96% 98% 97% 99%  ?Weight:      ? ? ?Intake/Output Summary (Last 24 hours) at 05/16/2021 1330 ?Last data filed at 05/16/2021 1102 ?Gross per 24 hour  ?Intake 0 ml  ?Output 925 ml  ?Net -925 ml  ? ? ?Wt Readings from Last 3 Encounters:  ?05/10/21 60.7 kg  ?05/02/21 58.5 kg  ?04/01/21 65.2 kg  ? ? ?Examination: ? ?Constitutional: obtunded ? ?Data  Reviewed: I have independently reviewed following labs and imaging studies  ? ?CBC ?Recent Labs  ?Lab 05/10/21 ?2130 05/10/21 ?8657 05/11/21 ?8469 05/12/21 ?0117 05/13/21 ?0103 05/14/21 ?0403  ?WBC 11.3*  --  7.0 7.4 9.7 6.7  ?HGB 12.2 11.9* 11.0* 10.7* 10.4* 10.2*  ?HCT 38.6 35.0* 33.9* 33.2* 32.0* 32.0*  ?PLT 201  --  154 156 183 214  ?MCV 92.6  --  89.7 90.2 89.1 89.6  ?MCH 29.3  --  29.1 29.1 29.0 28.6  ?MCHC 31.6  --  32.4 32.2 32.5 31.9  ?RDW 15.0  --  15.0 14.9 15.2 15.6*  ? ? ? ?Recent Labs  ?Lab 05/10/21 ?6295  05/10/21 ?2841 05/10/21 ?1734 05/11/21 ?3244 05/12/21 ?0117 05/13/21 ?0103 05/14/21 ?0403  ?NA 143 143  --  145 145 144 147*  ?K 4.1 4.0  --  3.1* 3.2* 4.0 3.3*  ?CL 112*  --   --  116* 120* 121* 124*  ?CO2 20*  --   --  21* 20* 16* 17*  ?GLUCOSE 152*  --   --  127* 157* 159* 109*  ?BUN 27*  --   --  '23 19 16 11  '$ ?CREATININE 0.87  --   --  0.67 0.57 0.50 0.59  ?CALCIUM 9.7  --   --  9.1 8.8* 9.0 8.8*  ?AST 23  --   --   --   --   --   --   ?ALT 26  --   --   --   --   --   --   ?ALKPHOS 111  --   --   --   --   --   --   ?BILITOT 1.4*  --   --   --   --   --   --   ?ALBUMIN 2.9*  --   --   --   --   --   --   ?PROCALCITON  --   --  <0.10 <0.10 <0.10  --   --   ?LATICACIDVEN 2.4*  --  1.3  --   --   --   --   ?INR 1.2  --   --   --   --   --   --   ? ? ? ?------------------------------------------------------------------------------------------------------------------ ?No results for input(s): CHOL, HDL, LDLCALC, TRIG, CHOLHDL, LDLDIRECT in the last 72 hours. ? ?Lab Results  ?Component Value Date  ? HGBA1C 6.1 (H) 03/01/2021  ? ?------------------------------------------------------------------------------------------------------------------ ?No results for input(s): TSH, T4TOTAL, T3FREE, THYROIDAB in the last 72 hours. ? ?Invalid input(s): FREET3 ? ?Cardiac Enzymes ?No results for input(s): CKMB, TROPONINI, MYOGLOBIN in the last 168 hours. ? ?Invalid input(s): CK ?------------------------------------------------------------------------------------------------------------------ ?   ?Component Value Date/Time  ? BNP 216.5 (H) 02/28/2021 0600  ? ? ?CBG: ?Recent Labs  ?Lab 05/11/21 ?0216 05/13/21 ?2022  ?GLUCAP 72 114*  ? ? ? ?Recent Results (from the past 240 hour(s))  ?Blood culture (routine x 2)     Status: Abnormal  ? Collection Time: 05/10/21  5:20 AM  ? Specimen: BLOOD  ?Result Value Ref Range Status  ? Specimen Description BLOOD FOOT  Final  ? Special Requests   Final  ?  BOTTLES DRAWN AEROBIC AND ANAEROBIC Blood  Culture adequate volume  ? Culture  Setup Time   Final  ?  GRAM POSITIVE COCCI ?AEROBIC BOTTLE ONLY ?CRITICAL VALUE NOTED.  VALUE IS CONSISTENT WITH PREVIOUSLY REPORTED AND CALLED VALUE. ?Performed at [facility], [place]  [address], [place], [place] [address] ?  ? Culture STAPHYLOCOCCUS EPIDERMIDIS (A)  Final  ? Report Status 05/13/2021 FINAL  Final  ? Organism ID, Bacteria STAPHYLOCOCCUS EPIDERMIDIS  Final  ?    Susceptibility  ? Staphylococcus epidermidis - MIC*  ?  CIPROFLOXACIN 4 RESISTANT Resistant   ?  ERYTHROMYCIN <=0.25 SENSITIVE Sensitive   ?  GENTAMICIN <=0.5 SENSITIVE Sensitive   ?  OXACILLIN >=4 RESISTANT Resistant   ?  TETRACYCLINE <=1 SENSITIVE Sensitive   ?  VANCOMYCIN 1 SENSITIVE Sensitive   ?  TRIMETH/SULFA 40 SENSITIVE Sensitive   ?  CLINDAMYCIN <=0.25 SENSITIVE Sensitive   ?  RIFAMPIN <=0.5 SENSITIVE Sensitive   ?  Inducible Clindamycin NEGATIVE Sensitive   ?  * STAPHYLOCOCCUS EPIDERMIDIS  ?Blood culture (routine x 2)     Status: Abnormal  ? Collection Time: 05/10/21  5:25 AM  ? Specimen: BLOOD LEFT FOREARM  ?Result Value Ref Range Status  ? Specimen Description BLOOD LEFT FOREARM  Final  ? Special Requests   Final  ?  BOTTLES DRAWN AEROBIC AND ANAEROBIC Blood Culture adequate volume  ? Culture  Setup Time   Final  ?  GRAM POSITIVE COCCI IN CLUSTERS ?IN BOTH AEROBIC AND ANAEROBIC BOTTLES ?CRITICAL RESULT CALLED TO, READ BACK BY AND VERIFIED WITH: T RUDISILL,PHARMD'@0256'$  05/11/21 [place] ?Performed at [facility], [place] [address] [place], [place] [address] ?  ? Culture STAPHYLOCOCCUS EPIDERMIDIS (A)  Final  ? Report Status 05/16/2021 FINAL  Final  ? Organism ID, Bacteria STAPHYLOCOCCUS EPIDERMIDIS  Final  ?    Susceptibility  ? Staphylococcus epidermidis - MIC*  ?  CIPROFLOXACIN 4 RESISTANT Resistant   ?  ERYTHROMYCIN >=8 RESISTANT Resistant   ?  GENTAMICIN 8 INTERMEDIATE Intermediate   ?  OXACILLIN >=4 RESISTANT Resistant   ?  TETRACYCLINE <=1 SENSITIVE Sensitive   ?  VANCOMYCIN 2 SENSITIVE  Sensitive   ?  TRIMETH/SULFA 80 RESISTANT Resistant   ?  CLINDAMYCIN >=8 RESISTANT Resistant   ?  RIFAMPIN <=0.5 SENSITIVE Sensitive   ?  Inducible Clindamycin NEGATIVE Sensitive   ?  * STAPHYLOCOCCUS EPIDERMID

## 2021-05-16 NOTE — Progress Notes (Addendum)
Manufacturing engineer [facility]) Hospital Liaison note. ?   ?Received request from [place] for family interest in [facility]. Spoke with family to confirm interest and answer questions. [facility] is unable to offer a room today. Hospital Liaison will follow up tomorrow or sooner if a room becomes available. Eligibility confirmed.  ? ?Please do not hesitate to call with questions.   ? ?Thank you,   ?Farrel Gordon, RN, CCM      ?[facility] Hospital Liaison   ?[phone] ?

## 2021-05-16 NOTE — Progress Notes (Signed)
OT Cancellation Note ? ?Patient Details ?Name: Kristina Wilkinson ?MRN: [id number] ?DOB: [birth year] ? ? ?Cancelled Treatment:    Reason Eval/Treat Not Completed: Other (comment) Per MD note, pt transitioned to comfort care. Confirmed with RN and OT to sign off at this time. ? ?Layla Maw ?05/16/2021, 7:21 AM ?

## 2021-05-17 DIAGNOSIS — J9601 Acute respiratory failure with hypoxia: Secondary | ICD-10-CM

## 2021-05-17 DIAGNOSIS — J189 Pneumonia, unspecified organism: Secondary | ICD-10-CM | POA: Diagnosis not present

## 2021-05-17 DIAGNOSIS — G9341 Metabolic encephalopathy: Secondary | ICD-10-CM | POA: Diagnosis not present

## 2021-05-17 DIAGNOSIS — A419 Sepsis, unspecified organism: Secondary | ICD-10-CM | POA: Diagnosis not present

## 2021-05-17 MED ORDER — HYDRALAZINE HCL 20 MG/ML IJ SOLN
10.0000 mg | Freq: Once | INTRAMUSCULAR | Status: AC
  Administered 2021-05-17: 10 mg via INTRAVENOUS
  Filled 2021-05-17: qty 1

## 2021-05-17 MED ORDER — MORPHINE SULFATE 20 MG/5ML PO SOLN: 2.5000 mg | ORAL | 0 refills | Status: AC | PRN

## 2021-05-17 MED ORDER — ALBUTEROL SULFATE (2.5 MG/3ML) 0.083% IN NEBU: 2.5000 mg | INHALATION_SOLUTION | RESPIRATORY_TRACT | 12 refills | Status: AC | PRN

## 2021-05-17 MED ORDER — MEDIHONEY WOUND/BURN DRESSING EX PSTE
1.0000 | PASTE | Freq: Every day | CUTANEOUS | 1 refills | Status: AC
Start: 2021-05-17 — End: ?

## 2021-05-17 MED ORDER — DOCUSATE SODIUM 100 MG PO CAPS
100.0000 mg | ORAL_CAPSULE | Freq: Two times a day (BID) | ORAL | 0 refills | Status: AC
Start: 2021-05-17 — End: ?

## 2021-05-17 MED ORDER — HYDRALAZINE HCL 20 MG/ML IJ SOLN: 10.0000 mg | Freq: Four times a day (QID) | INTRAMUSCULAR | Status: DC | PRN

## 2021-05-17 NOTE — TOC Transition Note (Signed)
Transition of Care (TOC) - CM/SW Discharge Note ? ? ?Patient Details  ?Name: Kristina Wilkinson ?MRN: [id number] ?Date of Birth: 11-Apr-1927 ? ?Transition of Care (TOC) CM/SW Contact:  ?Paulene Floor Demeshia Sherburne, LCSWA ?Phone Number: ?05/17/2021, 1:58 PM ? ? ?Clinical Narrative:    ?Patient will DC to  [facility] ?Anticipated DC date:  05/17/2021 ?Family notified:  Yes ?Transport by:  Corey Harold ? ? ?Per MD patient ready for DC to [facility]idential [facility]. RN to call report prior to discharge [phone]). RN, patient, patient's family, and facility notified of DC.  Ambulance transport will be requested for patient once family signs paperwork at hospice facility.  ? ?CSW will sign off for now as social work intervention is no longer needed. Please consult [place] again if new needs arise. ?  ? ? ?Final next level of care: [place] ?Barriers to Discharge: Barriers Resolved ? ? ?Patient Goals and CMS Choice ?  ?  ?  ? ?Discharge Placement ?  ?           ?Patient chooses bed at:  [facility]) ?Patient to be transferred to facility by: [facility] ?Name of family member notified: Brady,Britt (Relative)   [phone] ?Patient and family notified of of transfer: 05/17/21 ? ?Discharge Plan and Services ?  ?  ?           ?  ?  ?  ?  ?  ?  ?  ?  ?  ?  ? ?Social Determinants of Health (SDOH) Interventions ?  ? ? ?Readmission Risk Interventions ? ?  03/30/2021  ?  2:14 PM 03/01/2021  ?  2:52 PM  ?Readmission Risk Prevention Plan  ?Transportation Screening Complete Complete  ?PCP or Specialist Appt within 3-5 Days  Complete  ?[place] or Home Care Consult  Complete  ?Social Work Consult for [place] Planning/Counseling  Complete  ?Palliative Care Screening  Not Applicable  ?Medication Review Press photographer) Complete Complete  ?PCP or Specialist appointment within 3-5 days of discharge Complete   ?[place] or Home Care Consult Complete   ?SW Recovery Care/Counseling Consult Complete   ?Palliative Care Screening Not Applicable   ?Skilled Nursing Facility  Complete   ? ? ? ? ? ?

## 2021-05-17 NOTE — Progress Notes (Signed)
Tove L Schauf to be discharged  to [facility]   per MD order. Patient's family is aware of the situation. ? ?Peripheral IV not removed per [facility] hospital Liaison request. ? ?Discharge packet assembled. An After Visit Summary (AVS) was printed and given to the EMS personnel. Patient escorted via stretcher and discharged to [facility] via ambulance. Report called to accepting facility; all questions and concerns addressed.  ? ?Amaryllis Dyke, RN  ?

## 2021-05-17 NOTE — Discharge Summary (Signed)
?Physician Discharge Summary ?  ?Patient: Kristina Wilkinson MRN: [id number] DOB: [birth year]  ?Admit date:     05/10/2021  ?Discharge date: 05/17/21  ?Discharge Physician: Estill Cotta, MD   ? ?PCP: Pcp, No  ? ?Recommendations at discharge:  ? ? COMFORT  CARE  ? ?Discharge Diagnoses: ? ?  Sepsis due to pneumonia [facility]) ?  Aspiration pneumonia ([place]) ? Staph epidermidis bacteremia  ? Urinary tract infection ?  Atrial fibrillation, chronic ([place]) ?  HTN (hypertension) ?  DM2 (diabetes mellitus, type 2) ([place]) ?  Dementia with behavioral disturbance ([place]) ?  Acute metabolic encephalopathy ?  DNR (do not resuscitate) ? ? ? ?Hospital Course: ?Patient is a 88 year old female with history of dementia, hypertension, atrial fibrillation presented to hospital with shortness of breath, concern for aspiration event.  She has had recent couple of admissions, one in February this year and another in March with similar presentation along with dehydration.  Patient had been seen by palliative medicine in the past as well.  She was placed on IV fluids, antibiotics but did not respond to the treatment.  Palliative goals of care discussions were held with POA and patient was transitioned to comfort care. ? ?Assessment and Plan: ?* Sepsis due to pneumonia ([place]), Staph epidermidis bacteremia, possible UTI ?-Patient was placed on IV fluids, 3/4 blood cultures showed Staph epidermidis, possibly contaminant.  Repeat blood cultures on 4/1 negative. ?-Dr. Cruzita Lederer discussed with ID, hard to say whether it is a contaminant, recommended to continue on antibiotics. ?-Urine cultures with 2 species, procidentia and Enterococcus.  Patient was placed on IV vancomycin and ceftriaxone, broad-spectrum antibiotics. ?-Given the lack of response, GOC's were addressed, patient placed on comfort care status ?-Accepted to residential hospice. ? ?Aspiration pneumonia ([place]) ?-Patient was placed on n.p.o. status, broad-spectrum antibiotics.  Patient was seen by speech  therapy for swallow evaluation, patient was noted to be mild aspiration risk. ?-At present comfort care status. ? ? ?Atrial fibrillation, chronic ([place]) ?-Was rate controlled with Toprol-XL, was held on admission due to bradycardia.  She was placed on Lovenox ?-Currently comfort care status ? ?DNR (do not resuscitate) ?-Continue comfort care status ? ?Acute metabolic encephalopathy superimposed on dementia ?-AMS is likely related to sepsis, aspiration PNA ?-Continue comfort care status ? ? ?DM2 (diabetes mellitus, type 2) ([place]) ?-Recent A1c was 6.1, indicating good control ?-Continue comfort care status ? ?HTN (hypertension) ?-Toprol-XL was held. ? ? ? ? {Tip this will not be part of the note when signed Body mass index is 21.6 kg/m?. ?, ,  ?Active Pressure Injury/Wound(s)   ? ? Pressure Ulcer  Duration  ? ?      ?  801 days  ?  77 days  ?  77 days  ? Pressure Injury 05/10/21 Heel Left Deep Tissue Pressure Injury - Purple or maroon localized area of discolored intact skin or blood-filled blister due to damage of underlying soft tissue from pressure and/or shear. stage 2 6 days  ? ?  ?  ? ?  ? ?Pain control - [employer] Controlled Substance Reporting System database was reviewed. and patient was instructed, not to drive, operate heavy machinery, perform activities at heights, swimming or participation in water activities or provide baby-sitting services while on Pain, Sleep and Anxiety Medications; until their outpatient Physician has advised to do so again. Also recommended to not to take more than prescribed Pain, Sleep and Anxiety Medications.  ?Consultants:  ?Procedures performed: None ?Disposition: Hospice care ?Diet recommendation:  ?Comfort  care ?DISCHARGE MEDICATION: ?Allergies as of 05/17/2021   ? ?   Reactions  ? Gabapentin Itching, Other (See Comments)  ? Made the legs "go numb" and "allergic," per [facility]  ? Other Other (See Comments)  ? "MD mixed a blood pressure medication with the Clinoril I was taking  and I couldn't walk" "made me achy and sick"  ? Penicillins Other (See Comments), Itching  ? Has patient had a PCN reaction causing immediate rash, facial/tongue/throat swelling, SOB or lightheadedness with hypotension: Unk- exact reaction not recalled, but is allergic ?Has patient had a PCN reaction causing severe rash involving mucus membranes or skin necrosis: Unk ?Has patient had a PCN reaction that required hospitalization: Unk ?Has patient had a PCN reaction occurring within the last 10 years: Yes ?If all of the above answers are "NO", then may proceed with Cephalosporin use. ?Itching and rash  ? Pregabalin Other (See Comments)  ? "Made me pass out" and "made the limbs go numb"- "allergic," per [facility]  ? Vit D-vit E-safflower Oil Other (See Comments)  ? "Made me achy and sick"  ? ?  ? ?  ?Medication List  ?  ? ?STOP taking these medications   ? ?apixaban 2.5 MG Tabs tablet ?Commonly known as: ELIQUIS ?  ?busPIRone 5 MG tablet ?Commonly known as: BUSPAR ?  ?Decubi-Vite Caps ?  ?metoprolol succinate 25 MG 24 hr tablet ?Commonly known as: TOPROL-XL ?  ?UNABLE TO FIND ?  ?vitamin C 500 MG tablet ?Commonly known as: ASCORBIC ACID ?  ? ?  ? ?TAKE these medications   ? ?acetaminophen 325 MG tablet ?Commonly known as: TYLENOL ?Take 2 tablets (650 mg total) by mouth every 6 (six) hours as needed for mild pain (or Fever >/= 101). ?  ?albuterol (2.5 MG/3ML) 0.083% nebulizer solution ?Commonly known as: PROVENTIL ?Take 3 mLs (2.5 mg total) by nebulization every 2 (two) hours as needed for wheezing. ?  ?docusate sodium 100 MG capsule ?Commonly known as: COLACE ?Take 1 capsule (100 mg total) by mouth 2 (two) times daily. ?  ?leptospermum manuka honey Pste paste ?Apply 1 application. topically daily. Cleanse the heel wounds with NS. Pat dry then apply a nickel thick layer of MediHoney directly to the wound or onto a dressing. Make certain that the dressing covers the entire wound base, but not in contact with the peri-wound  skin, then cover with gauze and secure with a few turns of Kerlix. Change dressing daily, apply thin layer (3 mm) to wound. ?What changed: additional instructions ?  ?morphine 20 MG/5ML solution ?Take 0.6 mLs (2.4 mg total) by mouth every 3 (three) hours as needed for pain (terminal pain or shortness of breath, comfort). ?  ?polyethylene glycol 17 g packet ?Commonly known as: MIRALAX / GLYCOLAX ?Take 17 g by mouth 2 (two) times daily. ?  ?senna 8.6 MG Tabs tablet ?Commonly known as: SENOKOT ?Take 8.6 mg by mouth in the morning and at bedtime. ?  ?sodium phosphate 7-19 GM/118ML Enem ?Place 133 mLs (1 enema total) rectally once as needed for severe constipation. ?  ? ?  ? ?  ?  ? ? ?  ?Discharge Care Instructions  ?(From admission, onward)  ?  ? ? ?  ? ?  Start     Ordered  ? 05/17/21 0000  Discharge wound care:       ?Comments: As per instructions  ? 05/17/21 0941  ? ?  ?  ? ?  ? ? ?Discharge Exam: ?Danley Danker  Weights  ? 05/10/21 1900  ?Weight: 60.7 kg  ? ?S: Awake but not responding to any verbal commands ? ?Vitals:  ? 05/16/21 0913 05/16/21 1720 05/16/21 2052 05/17/21 0934  ?BP: (!) 150/71 (!) 142/70 (!) 148/65 (!) 151/108  ?Pulse: 74  68 100  ?Resp: '18 16 15 16  '$ ?Temp: 98.2 ?F (36.8 ?C) 98 ?F (36.7 ?C) 98.4 ?F (36.9 ?C) 97.9 ?F (36.6 ?C)  ?TempSrc:  Oral Oral Axillary  ?SpO2: 99% 96% 99% 97%  ?Weight:      ?  ?Physical Exam ?General: Awake but not responding to any verbal commands ?Cardiovascular: S1 S2 clear, RRR. No pedal edema b/l ?Respiratory: Bilateral scattered rhonchi ?Gastrointestinal: Soft, nontender,  NBS ?Ext: no pedal edema bilaterally ?Neuro: does not follow any verbal commands ? ? ?Condition at discharge: poor ? ?The results of significant diagnostics from this hospitalization (including imaging, microbiology, ancillary and laboratory) are listed below for reference.  ? ?Imaging Studies: ?DG Chest 1 View ? ?Result Date: 04/29/2021 ?CLINICAL DATA:  Possible sepsis. EXAM: CHEST  1 VIEW COMPARISON:   03/28/2021 and prior radiographs FINDINGS: This is a low volume study. Continued bibasilar atelectasis/scarring again noted. Cardiomediastinal silhouette is unchanged. There is no evidence of focal airspace disease, p

## 2021-05-17 NOTE — Progress Notes (Signed)
Manufacturing engineer [facility]) Hospital Liaison note.    ? ?This patient is approved to transfer to [facility] today.   ? ?[facility] will notify TOC when registration paperwork has been completed to arrange transport.   ? ?RN please call report to [phone]. Please leave IV access in place.  ? ?Thank you,     ?Farrel Gordon, RN, CCM       ?[facility] Hospital Liaison  ?[phone] ?

## 2021-05-18 LAB — CULTURE, BLOOD (ROUTINE X 2)
Culture: NO GROWTH
Culture: NO GROWTH
Special Requests: ADEQUATE

## 2021-06-12 DEATH — deceased
